# Patient Record
Sex: Female | Born: 1965
Health system: Southern US, Community
[De-identification: ages and names within clinical notes are randomized; demographics above are authoritative.]

## PROBLEM LIST (undated history)

## (undated) DIAGNOSIS — D649 Anemia, unspecified: Secondary | ICD-10-CM

## (undated) DIAGNOSIS — J309 Allergic rhinitis, unspecified: Secondary | ICD-10-CM

## (undated) DIAGNOSIS — N3010 Interstitial cystitis (chronic) without hematuria: Secondary | ICD-10-CM

## (undated) DIAGNOSIS — F419 Anxiety disorder, unspecified: Secondary | ICD-10-CM

## (undated) DIAGNOSIS — K5792 Diverticulitis of intestine, part unspecified, without perforation or abscess without bleeding: Secondary | ICD-10-CM

## (undated) DIAGNOSIS — A048 Other specified bacterial intestinal infections: Secondary | ICD-10-CM

## (undated) DIAGNOSIS — L259 Unspecified contact dermatitis, unspecified cause: Secondary | ICD-10-CM

## (undated) DIAGNOSIS — F0781 Postconcussional syndrome: Secondary | ICD-10-CM

## (undated) DIAGNOSIS — J302 Other seasonal allergic rhinitis: Secondary | ICD-10-CM

## (undated) DIAGNOSIS — I059 Rheumatic mitral valve disease, unspecified: Secondary | ICD-10-CM

## (undated) DIAGNOSIS — M545 Low back pain, unspecified: Secondary | ICD-10-CM

## (undated) DIAGNOSIS — E782 Mixed hyperlipidemia: Secondary | ICD-10-CM

## (undated) DIAGNOSIS — K219 Gastro-esophageal reflux disease without esophagitis: Secondary | ICD-10-CM

## (undated) DIAGNOSIS — J3089 Other allergic rhinitis: Principal | ICD-10-CM

## (undated) DIAGNOSIS — K589 Irritable bowel syndrome without diarrhea: Secondary | ICD-10-CM

## (undated) DIAGNOSIS — M1990 Unspecified osteoarthritis, unspecified site: Secondary | ICD-10-CM

## (undated) DIAGNOSIS — K648 Other hemorrhoids: Secondary | ICD-10-CM

## (undated) HISTORY — DX: Mixed hyperlipidemia: E78.2

## (undated) HISTORY — PX: SINUS SURGERY WITH INSTATRAK: SHX5215

## (undated) HISTORY — DX: Other hemorrhoids: K64.8

## (undated) HISTORY — DX: Unspecified osteoarthritis, unspecified site: M19.90

## (undated) HISTORY — DX: Irritable bowel syndrome, unspecified: K58.9

## (undated) HISTORY — DX: Low back pain, unspecified: M54.50

## (undated) HISTORY — DX: Anxiety disorder, unspecified: F41.9

## (undated) HISTORY — DX: Anemia, unspecified: D64.9

## (undated) HISTORY — DX: Allergic rhinitis, unspecified: J30.9

## (undated) HISTORY — DX: Low back pain: M54.5

## (undated) HISTORY — DX: Diverticulitis of intestine, part unspecified, without perforation or abscess without bleeding: K57.92

## (undated) HISTORY — DX: Other seasonal allergic rhinitis: J30.2

## (undated) HISTORY — DX: Other specified bacterial intestinal infections: A04.8

## (undated) HISTORY — DX: Rheumatic mitral valve disease, unspecified: I05.9

## (undated) HISTORY — DX: Unspecified contact dermatitis, unspecified cause: L25.9

## (undated) HISTORY — DX: Gastro-esophageal reflux disease without esophagitis: K21.9

## (undated) HISTORY — DX: Postconcussional syndrome: F07.81

## (undated) HISTORY — PX: MOUTH SURGERY: SHX715

## (undated) HISTORY — DX: Other allergic rhinitis: J30.89

## (undated) HISTORY — DX: Interstitial cystitis (chronic) without hematuria: N30.10

---

## 1993-12-31 ENCOUNTER — Encounter: Payer: Self-pay | Admitting: Internal Medicine

## 1994-01-06 ENCOUNTER — Encounter: Payer: Self-pay | Admitting: Internal Medicine

## 1994-01-22 ENCOUNTER — Encounter: Payer: Self-pay | Admitting: Internal Medicine

## 1995-01-27 ENCOUNTER — Encounter: Payer: Self-pay | Admitting: Internal Medicine

## 1995-02-05 ENCOUNTER — Encounter: Payer: Self-pay | Admitting: Internal Medicine

## 1995-03-10 ENCOUNTER — Encounter: Payer: Self-pay | Admitting: Internal Medicine

## 1995-07-29 ENCOUNTER — Encounter: Payer: Self-pay | Admitting: Internal Medicine

## 1999-01-08 ENCOUNTER — Ambulatory Visit: Admission: RE | Admit: 1999-01-08 | Discharge: 1999-01-08 | Payer: Self-pay | Admitting: Obstetrics and Gynecology

## 1999-02-18 ENCOUNTER — Encounter: Admission: RE | Admit: 1999-02-18 | Discharge: 1999-05-19 | Payer: Self-pay | Admitting: Obstetrics & Gynecology

## 1999-04-16 ENCOUNTER — Inpatient Hospital Stay (HOSPITAL_COMMUNITY): Admission: AD | Admit: 1999-04-16 | Discharge: 1999-04-19 | Payer: Self-pay | Admitting: Obstetrics and Gynecology

## 1999-04-16 ENCOUNTER — Encounter (INDEPENDENT_AMBULATORY_CARE_PROVIDER_SITE_OTHER): Payer: Self-pay

## 2000-04-05 ENCOUNTER — Encounter: Payer: Self-pay | Admitting: Internal Medicine

## 2001-09-13 ENCOUNTER — Other Ambulatory Visit: Admission: RE | Admit: 2001-09-13 | Discharge: 2001-09-13 | Payer: Self-pay | Admitting: Obstetrics and Gynecology

## 2002-04-07 ENCOUNTER — Ambulatory Visit (HOSPITAL_BASED_OUTPATIENT_CLINIC_OR_DEPARTMENT_OTHER): Admission: RE | Admit: 2002-04-07 | Discharge: 2002-04-07 | Payer: Self-pay | Admitting: *Deleted

## 2002-09-28 ENCOUNTER — Other Ambulatory Visit: Admission: RE | Admit: 2002-09-28 | Discharge: 2002-09-28 | Payer: Self-pay | Admitting: Obstetrics and Gynecology

## 2003-11-13 ENCOUNTER — Other Ambulatory Visit: Admission: RE | Admit: 2003-11-13 | Discharge: 2003-11-13 | Payer: Self-pay | Admitting: Obstetrics and Gynecology

## 2004-08-21 ENCOUNTER — Ambulatory Visit: Payer: Self-pay | Admitting: Internal Medicine

## 2004-09-25 ENCOUNTER — Ambulatory Visit: Payer: Self-pay | Admitting: Internal Medicine

## 2004-12-26 ENCOUNTER — Ambulatory Visit: Payer: Self-pay | Admitting: Family Medicine

## 2005-02-10 ENCOUNTER — Ambulatory Visit: Payer: Self-pay | Admitting: Internal Medicine

## 2005-03-04 ENCOUNTER — Other Ambulatory Visit: Admission: RE | Admit: 2005-03-04 | Discharge: 2005-03-04 | Payer: Self-pay | Admitting: Obstetrics and Gynecology

## 2005-06-18 ENCOUNTER — Ambulatory Visit: Payer: Self-pay | Admitting: Internal Medicine

## 2006-01-13 ENCOUNTER — Ambulatory Visit: Payer: Self-pay | Admitting: Internal Medicine

## 2006-01-18 ENCOUNTER — Ambulatory Visit: Payer: Self-pay | Admitting: Internal Medicine

## 2006-01-20 ENCOUNTER — Ambulatory Visit: Payer: Self-pay | Admitting: Internal Medicine

## 2006-03-01 ENCOUNTER — Ambulatory Visit: Payer: Self-pay | Admitting: Internal Medicine

## 2006-05-20 ENCOUNTER — Ambulatory Visit: Payer: Self-pay | Admitting: Internal Medicine

## 2006-05-21 ENCOUNTER — Emergency Department (HOSPITAL_COMMUNITY): Admission: EM | Admit: 2006-05-21 | Discharge: 2006-05-21 | Payer: Self-pay | Admitting: Emergency Medicine

## 2006-05-24 ENCOUNTER — Ambulatory Visit: Payer: Self-pay | Admitting: Internal Medicine

## 2006-05-28 ENCOUNTER — Encounter: Admission: RE | Admit: 2006-05-28 | Discharge: 2006-08-19 | Payer: Self-pay | Admitting: Internal Medicine

## 2006-06-04 ENCOUNTER — Ambulatory Visit: Payer: Self-pay | Admitting: Internal Medicine

## 2006-06-21 ENCOUNTER — Ambulatory Visit: Payer: Self-pay | Admitting: Internal Medicine

## 2006-06-25 ENCOUNTER — Encounter: Admission: RE | Admit: 2006-06-25 | Discharge: 2006-06-25 | Payer: Self-pay | Admitting: Internal Medicine

## 2006-08-30 ENCOUNTER — Ambulatory Visit: Payer: Self-pay | Admitting: Internal Medicine

## 2006-09-24 ENCOUNTER — Ambulatory Visit: Payer: Self-pay | Admitting: Family Medicine

## 2006-09-24 ENCOUNTER — Encounter: Admission: RE | Admit: 2006-09-24 | Discharge: 2006-10-01 | Payer: Self-pay | Admitting: Neurosurgery

## 2006-12-14 DIAGNOSIS — M545 Low back pain, unspecified: Secondary | ICD-10-CM | POA: Insufficient documentation

## 2006-12-14 DIAGNOSIS — J301 Allergic rhinitis due to pollen: Secondary | ICD-10-CM | POA: Insufficient documentation

## 2006-12-22 ENCOUNTER — Telehealth (INDEPENDENT_AMBULATORY_CARE_PROVIDER_SITE_OTHER): Payer: Self-pay | Admitting: *Deleted

## 2007-06-10 ENCOUNTER — Telehealth (INDEPENDENT_AMBULATORY_CARE_PROVIDER_SITE_OTHER): Payer: Self-pay | Admitting: *Deleted

## 2007-07-09 ENCOUNTER — Encounter: Payer: Self-pay | Admitting: Internal Medicine

## 2007-07-09 DIAGNOSIS — L259 Unspecified contact dermatitis, unspecified cause: Secondary | ICD-10-CM | POA: Insufficient documentation

## 2007-07-09 DIAGNOSIS — I059 Rheumatic mitral valve disease, unspecified: Secondary | ICD-10-CM | POA: Insufficient documentation

## 2007-07-09 DIAGNOSIS — J4520 Mild intermittent asthma, uncomplicated: Secondary | ICD-10-CM | POA: Insufficient documentation

## 2007-07-09 DIAGNOSIS — K219 Gastro-esophageal reflux disease without esophagitis: Secondary | ICD-10-CM | POA: Insufficient documentation

## 2007-09-01 ENCOUNTER — Encounter: Payer: Self-pay | Admitting: Internal Medicine

## 2007-12-05 ENCOUNTER — Telehealth: Payer: Self-pay | Admitting: Internal Medicine

## 2008-02-07 ENCOUNTER — Telehealth (INDEPENDENT_AMBULATORY_CARE_PROVIDER_SITE_OTHER): Payer: Self-pay | Admitting: *Deleted

## 2008-02-09 ENCOUNTER — Ambulatory Visit: Payer: Self-pay | Admitting: Internal Medicine

## 2008-11-15 ENCOUNTER — Encounter: Payer: Self-pay | Admitting: Internal Medicine

## 2009-11-06 ENCOUNTER — Ambulatory Visit: Payer: Self-pay | Admitting: Diagnostic Radiology

## 2009-11-06 ENCOUNTER — Emergency Department (HOSPITAL_BASED_OUTPATIENT_CLINIC_OR_DEPARTMENT_OTHER): Admission: EM | Admit: 2009-11-06 | Discharge: 2009-11-06 | Payer: Self-pay | Admitting: Emergency Medicine

## 2009-11-08 ENCOUNTER — Ambulatory Visit (HOSPITAL_BASED_OUTPATIENT_CLINIC_OR_DEPARTMENT_OTHER): Admission: RE | Admit: 2009-11-08 | Discharge: 2009-11-08 | Payer: Self-pay | Admitting: Emergency Medicine

## 2009-11-08 ENCOUNTER — Telehealth: Payer: Self-pay | Admitting: Internal Medicine

## 2009-11-08 ENCOUNTER — Ambulatory Visit: Payer: Self-pay | Admitting: Diagnostic Radiology

## 2010-01-24 ENCOUNTER — Ambulatory Visit: Payer: Self-pay | Admitting: Internal Medicine

## 2010-02-06 DIAGNOSIS — E162 Hypoglycemia, unspecified: Secondary | ICD-10-CM | POA: Insufficient documentation

## 2010-03-06 ENCOUNTER — Telehealth: Payer: Self-pay | Admitting: Internal Medicine

## 2010-03-07 ENCOUNTER — Ambulatory Visit: Payer: Self-pay | Admitting: Internal Medicine

## 2010-03-07 DIAGNOSIS — R197 Diarrhea, unspecified: Secondary | ICD-10-CM | POA: Insufficient documentation

## 2010-03-07 LAB — CONVERTED CEMR LAB
Basophils Absolute: 0 10*3/uL (ref 0.0–0.1)
CO2: 28 meq/L (ref 19–32)
Chloride: 102 meq/L (ref 96–112)
HCT: 35 % — ABNORMAL LOW (ref 36.0–46.0)
Hemoglobin: 12 g/dL (ref 12.0–15.0)
Lymphocytes Relative: 35.5 % (ref 12.0–46.0)
MCHC: 34.2 g/dL (ref 30.0–36.0)
MCV: 92 fL (ref 78.0–100.0)
Neutrophils Relative %: 49.1 % (ref 43.0–77.0)
Potassium: 3.8 meq/L (ref 3.5–5.1)
RBC: 3.8 M/uL — ABNORMAL LOW (ref 3.87–5.11)
Sodium: 137 meq/L (ref 135–145)
WBC: 3.6 10*3/uL — ABNORMAL LOW (ref 4.5–10.5)

## 2010-03-10 ENCOUNTER — Telehealth: Payer: Self-pay | Admitting: Internal Medicine

## 2010-03-11 ENCOUNTER — Telehealth: Payer: Self-pay | Admitting: Internal Medicine

## 2010-03-12 ENCOUNTER — Telehealth: Payer: Self-pay | Admitting: Internal Medicine

## 2010-03-17 ENCOUNTER — Telehealth: Payer: Self-pay | Admitting: Internal Medicine

## 2010-03-25 ENCOUNTER — Ambulatory Visit: Payer: Self-pay | Admitting: Internal Medicine

## 2010-03-25 DIAGNOSIS — D649 Anemia, unspecified: Secondary | ICD-10-CM | POA: Insufficient documentation

## 2010-03-25 DIAGNOSIS — K589 Irritable bowel syndrome without diarrhea: Secondary | ICD-10-CM | POA: Insufficient documentation

## 2010-04-03 ENCOUNTER — Telehealth: Payer: Self-pay | Admitting: Internal Medicine

## 2010-04-07 ENCOUNTER — Telehealth: Payer: Self-pay | Admitting: Internal Medicine

## 2010-04-09 ENCOUNTER — Telehealth: Payer: Self-pay | Admitting: Internal Medicine

## 2010-05-02 ENCOUNTER — Telehealth: Payer: Self-pay | Admitting: Internal Medicine

## 2010-05-05 ENCOUNTER — Encounter: Payer: Self-pay | Admitting: Gastroenterology

## 2010-05-14 ENCOUNTER — Encounter: Payer: Self-pay | Admitting: Internal Medicine

## 2010-05-15 ENCOUNTER — Ambulatory Visit: Admit: 2010-05-15 | Payer: Self-pay | Admitting: Gastroenterology

## 2010-05-22 ENCOUNTER — Telehealth: Payer: Self-pay | Admitting: Internal Medicine

## 2010-05-26 ENCOUNTER — Ambulatory Visit
Admission: RE | Admit: 2010-05-26 | Discharge: 2010-05-26 | Payer: Self-pay | Source: Home / Self Care | Attending: Internal Medicine | Admitting: Internal Medicine

## 2010-05-26 DIAGNOSIS — R143 Flatulence: Secondary | ICD-10-CM

## 2010-05-26 DIAGNOSIS — R142 Eructation: Secondary | ICD-10-CM

## 2010-05-26 DIAGNOSIS — R141 Gas pain: Secondary | ICD-10-CM | POA: Insufficient documentation

## 2010-05-27 ENCOUNTER — Telehealth: Payer: Self-pay | Admitting: Internal Medicine

## 2010-05-29 NOTE — Letter (Signed)
Summary: New Patient letter  [facility]  [address] [place], [place] [address]   Phone: [phone]  Fax: [phone]       05/14/2010 MRN: [id number]  KENDALE KOKOSKA [place] [address], [place]  [address]  Dear Ms. Mccartin,  Welcome to the Gastroenterology Division at [facility].    You are scheduled to see Dr.  Scarlette Shorts on 05-26-2010 at 11am on the 3rd floor at [facility], [place] [employer] [facility].  We ask that you try to arrive at our office 15 minutes prior to your appointment time to allow for check-in.  We would like you to complete the enclosed self-administered evaluation form prior to your visit and bring it with you on the day of your appointment.  We will review it with you.  Also, please bring a complete list of all your medications or, if you prefer, bring the medication bottles and we will list them.  Please bring your insurance card so that we may make a copy of it.  If your insurance requires a referral to see a specialist, please bring your referral form from your primary care physician.  Co-payments are due at the time of your visit and may be paid by cash, check or credit card.     Your office visit will consist of a consult with your physician (includes a physical exam), any laboratory testing he/she may order, scheduling of any necessary diagnostic testing (e.g. x-ray, ultrasound, CT-scan), and scheduling of a procedure (e.g. Endoscopy, Colonoscopy) if required.  Please allow enough time on your schedule to allow for any/all of these possibilities.    If you cannot keep your appointment, please call [phone] to cancel or reschedule prior to your appointment date.  This allows [place] the opportunity to schedule an appointment for another patient in need of care.  If you do not cancel or reschedule by 5 p.m. the business day prior to your appointment date, you will be charged a $50.00 late cancellation/no-show fee.    Thank you  for choosing [facility] for your medical needs.  We appreciate the opportunity to care for you.  Please visit [place] at our website  to learn more about our practice.                     Sincerely,                                                             The Gastroenterology Division

## 2010-05-29 NOTE — Letter (Signed)
Summary: New Patient letter  [facility]  [address] [place], [place] [address]   Phone: [phone]  Fax: [phone]       05/05/2010 MRN: [id number]  [name] [place] [address], [place]  [address]  Dear [name],  Welcome to the Gastroenterology Division at [facility].    You are scheduled to see Dr.  Sharlett Iles on 05-15-2010 at [place] on the 3rd floor at [facility], [place] [employer] [facility] [facility].  We ask that you try to arrive at our office 15 minutes prior to your appointment time to allow for check-in.  We would like you to complete the enclosed self-administered evaluation form prior to your visit and bring it with you on the day of your appointment.  We will review it with you.  Also, please bring a complete list of all your medications or, if you prefer, bring the medication bottles and we will list them.  Please bring your insurance card so that we may make a copy of it.  If your insurance requires a referral to see a specialist, please bring your referral form from your primary care physician.  Co-payments are due at the time of your visit and may be paid by cash, check or credit card.     Your office visit will consist of a consult with your physician (includes a physical exam), any laboratory testing he/she may order, scheduling of any necessary diagnostic testing (e.g. x-ray, ultrasound, CT-scan), and scheduling of a procedure (e.g. Endoscopy, Colonoscopy) if required.  Please allow enough time on your schedule to allow for any/all of these possibilities.    If you cannot keep your appointment, please call [phone] to cancel or reschedule prior to your appointment date.  This allows [place] the opportunity to schedule an appointment for another patient in need of care.  If you do not cancel or reschedule by 5 p.m. the business day prior to your appointment date, you will be charged a $50.00 late cancellation/no-show fee.    Thank you for  choosing [facility] for your medical needs.  We appreciate the opportunity to care for you.  Please visit [place] at our website  to learn more about our practice.                     Sincerely,                                                             The Gastroenterology Division

## 2010-05-29 NOTE — Progress Notes (Signed)
Summary: advise of rx  Phone Note Call from Patient Call back at Home Phone [phone] Call back at [phone]   Caller: Patient---triage vm Reason for Call: Referral Summary of Call: she was told to cut her Hyoscyamine in half. the pharmacist told her that this is a time release meda and cannot be cut in half for her spasms in her colon. please advise of next step. should she take her old rx from 2009, that dissolves under her tongue as needed? Initial call taken by: Despina Arias,  April 07, 2010 11:08 AM  Follow-up for Phone Call        pt informed ok to break in half per dr Arnoldo Morale Follow-up by: Allyne Gee, LPN,  December 12, 624THL 12:23 PM

## 2010-05-29 NOTE — Letter (Signed)
Summary: LAB RESULTS  LAB RESULTS   Imported By: Betsy Pries 01/24/2010 14:47:47  _____________________________________________________________________  External Attachment:    Type:   Image     Comment:   External Document

## 2010-05-29 NOTE — Letter (Signed)
Summary: New Patient letter  [facility]  [address] [place], [place] [address]   Phone: [phone]  Fax: [phone]       05/05/2010 MRN: [id number]  [name] [place] [address] [place], [place]  [address]  Dear Ms. Kozlowski,  Welcome to the Gastroenterology Division at [facility].    You are scheduled to see Dr.  Sharlett Iles on 05-15-2010 at [place] on the 3rd floor at [facility], [place] [employer] [facility] [facility].  We ask that you try to arrive at our office 15 minutes prior to your appointment time to allow for check-in.  We would like you to complete the enclosed self-administered evaluation form prior to your visit and bring it with you on the day of your appointment.  We will review it with you.  Also, please bring a complete list of all your medications or, if you prefer, bring the medication bottles and we will list them.  Please bring your insurance card so that we may make a copy of it.  If your insurance requires a referral to see a specialist, please bring your referral form from your primary care physician.  Co-payments are due at the time of your visit and may be paid by cash, check or credit card.     Your office visit will consist of a consult with your physician (includes a physical exam), any laboratory testing he/she may order, scheduling of any necessary diagnostic testing (e.g. x-ray, ultrasound, CT-scan), and scheduling of a procedure (e.g. Endoscopy, Colonoscopy) if required.  Please allow enough time on your schedule to allow for any/all of these possibilities.    If you cannot keep your appointment, please call [phone] to cancel or reschedule prior to your appointment date.  This allows [place] the opportunity to schedule an appointment for another patient in need of care.  If you do not cancel or reschedule by 5 p.m. the business day prior to your appointment date, you will be charged a $50.00 late cancellation/no-show fee.    Thank you for  choosing [facility] for your medical needs.  We appreciate the opportunity to care for you.  Please visit [place] at our website  to learn more about our practice.                     Sincerely,                                                             The Gastroenterology Division

## 2010-05-29 NOTE — Progress Notes (Signed)
  Phone Note Call from Patient   Summary of Call: stating levid works for stomach but after she takes it causes chest tightness and makes her very sleepy- can she half it or do you want to give her som ething else? Initial call taken by: Allyne Gee, LPN,  December  8, 624THL 11:07 AM  Follow-up for Phone Call        per dr Arnoldo Morale- she may take only 1/2 at a time Follow-up by: Allyne Gee, LPN,  December  8, 624THL 11:31 AM  Additional Follow-up for Phone Call Additional follow up Details #1::        Notified pt. Additional Follow-up by: Deanna Artis CMA AAMA,  April 03, 2010 11:39 AM

## 2010-05-29 NOTE — Progress Notes (Signed)
  Phone Note Call from Patient Call back at Home Phone [phone]   Caller: Patient Call For: Ricard Dillon MD Summary of Call: Pt is urinating a lot, is clear, and is very thirsty and tired.  Wonders about DM Initial call taken by: Deanna Artis CMA AAMA,  April 09, 2010 3:55 PM  Follow-up for Phone Call        pt worried that urine is clear- she was informed that is greast!!dont look for thing s that are wrong- her labs are good- dont worry about dm- then asked if medicien is making her tiree- I told her I didnt know- Follow-up by: Allyne Gee, LPN,  December 14, 624THL 4:23 PM

## 2010-05-29 NOTE — Progress Notes (Signed)
  Phone Note Call from Patient   Caller: Patient Call For: Ricard Dillon MD Summary of Call: Pt would like Singulair and Astelyn for sinus drainage and URI symptoms. [id number] Initial call taken by: [facility] CMA AAMA,  May 22, 2010 10:14 AM    Prescriptions: SINGULAIR 10 MG TABS (MONTELUKAST SODIUM) 1 once daily  #30 x 3   Entered by:   Allyne Gee, LPN   Authorized by:   Ricard Dillon MD   Signed by:   Allyne Gee, LPN on [id number]   Method used:   Electronically to        [facility] [id number] (retail)       [address]       [place], [place]  [address]       Ph: [phone]       Fax: [phone]   RxID:   [id number]

## 2010-05-29 NOTE — Assessment & Plan Note (Signed)
Summary: pt to re-est/per Dr. Edynn Gillock/cjr   Vital Signs:  Patient profile:   45 year old female Height:      59 inches Weight:      102 pounds BMI:     20.68 Temp:     98.2 degrees F oral Pulse rate:   68 / minute Resp:     12 per minute BP sitting:   110 / 70  (left arm)  Vitals Entered By: Allyne Gee, LPN (September 30, 624THL 3:00 PM) CC: re-establish Is Patient Diabetic? No   Primary Care Provider:  Ricard Dillon MD  CC:  re-establish.  History of Present Illness: Had a severe UTI and passed out... she felt hot and diaphoretic. She may have had a bowl of oatmeal ut did not eat that much... and she was on ciprofloxin she has risk for prediabete ande hypogycemia Her asthma has been stable her GERD   Preventive Screening-Counseling & Management  Alcohol-Tobacco     Smoking Status: never  Problems Prior to Update: 1)  Mitral Valve Prolapse  (ICD-424.0) 2)  Dermatitis  (ICD-692.9) 3)  Esophageal Reflux  (ICD-530.81) 4)  Asthma  (ICD-493.90) 5)  Allergic Rhinitis  (ICD-477.9) 6)  Family History of Alcoholism/addiction  (ICD-V61.41) 7)  Family History Diabetes 1st Degree Relative  (ICD-V18.0) 8)  Low Back Pain  (ICD-724.2)  Current Problems (verified): 1)  Mitral Valve Prolapse  (ICD-424.0) 2)  Dermatitis  (ICD-692.9) 3)  Esophageal Reflux  (ICD-530.81) 4)  Asthma  (ICD-493.90) 5)  Allergic Rhinitis  (ICD-477.9) 6)  Family History of Alcoholism/addiction  (ICD-V61.41) 7)  Family History Diabetes 1st Degree Relative  (ICD-V18.0) 8)  Low Back Pain  (ICD-724.2)  Medications Prior to Update: 1)  Singulair 10 Mg  Tabs (Montelukast Sodium) .... Take 1/2 By Mouth Once Daily 2)  Nasonex 50 Mcg/act Susp (Mometasone Furoate) .... Use As Directed 3)  Atrovent Hfa 17 Mcg/act Aers (Ipratropium Bromide Hfa) .... 2 Puffs Four Times A Day As Needed Rescue  Current Medications (verified): 1)  Nasonex 50 Mcg/act Susp (Mometasone Furoate) .... Use As Directed 2)  Atrovent  Hfa 17 Mcg/act Aers (Ipratropium Bromide Hfa) .... 2 Puffs Four Times A Day As Needed Rescue  Allergies (verified): 1)  ! Biaxin 2)  ! Pcn 3)  ! Codeine 4)  ! Amoxicillin 5)  ! Septra 6)  ! * Z-Pack 7)  ! Soma  Past History:  Family History: Last updated: 12/14/2006 Family History Diabetes 1st degree relative Family History Hypertension Family History of Alcoholism/Addiction  Social History: Last updated: 02/09/2008 Occupation: Occupational psychologist Married Marine scientist, Pilates at [employer] Never Smoked Alcohol use-yes  Risk Factors: Smoking Status: never (01/24/2010)  Past medical, surgical, family and social histories (including risk factors) reviewed, and no changes noted (except as noted below).  Past Medical History: Reviewed history from 07/09/2007 and no changes required. MITRAL VALVE PROLAPSE (ICD-424.0) DERMATITIS (ICD-692.9) ESOPHAGEAL REFLUX (ICD-530.81) ASTHMA (ICD-493.90) FAMILY HISTORY OF ALCOHOLISM/ADDICTION (ICD-V61.41) FAMILY HISTORY DIABETES 1ST DEGREE RELATIVE (ICD-V18.0) LOW BACK PAIN (ICD-724.2) ALLERGIC RHINITIS (ICD-477.9)  Past Surgical History: Reviewed history from 12/14/2006 and no changes required. Denies surgical history  Family History: Reviewed history from 12/14/2006 and no changes required. Family History Diabetes 1st degree relative Family History Hypertension Family History of Alcoholism/Addiction  Social History: Reviewed history from 02/09/2008 and no changes required. Occupation: Occupational psychologist Married Marine scientist, Pilates at [employer] Alcohol use-yes  Review of Systems       The patient complains of hoarseness.  The patient denies anorexia, fever, weight loss, weight gain, vision loss, decreased hearing, chest pain, syncope, dyspnea on exertion, peripheral edema, prolonged cough, headaches, hemoptysis, abdominal pain, hematochezia, hematuria, incontinence, genital sores, muscle weakness, suspicious skin lesions,  transient blindness, difficulty walking, depression, unusual weight change, abnormal bleeding, enlarged lymph nodes, angioedema, and breast masses.         Flu Vaccine Consent Questions     Do you have a history of severe allergic reactions to this vaccine? no    Any prior history of allergic reactions to egg and/or gelatin? no    Do you have a sensitivity to the preservative Thimersol? no    Do you have a past history of Guillan-Barre Syndrome? no    Do you currently have an acute febrile illness? no    Have you ever had a severe reaction to latex? no    Vaccine information given and explained to patient? yes    Are you currently pregnant? no    Lot Number:[id number]   Exp Date:10/25/2010   Site Given  Left Deltoid IM   Physical Exam  General:  alert and well-developed.   Head:  normocephalic and atraumatic.   Eyes:  pupils equal and pupils round.   Ears:  R ear normal and L ear normal.   Lungs:  normal respiratory effort and no wheezes.   Heart:  normal rate and regular rhythm.   Abdomen:  soft and non-tender.   Msk:  lumbar lordosis, SI joint tenderness, and trigger point tenderness.   Extremities:  No clubbing, cyanosis, edema, or deformity noted with normal full range of motion of all joints.   Neurologic:  alert & oriented X3 and gait normal.     Impression & Recommendations:  Problem # 1:  LOW BACK PAIN (ICD-724.2)  point injection for spasm site prepped in the left lower back  the site was prepped with betadyne and then 20 mg og depometrol was injected into the low back, the pr tolerated well  Discussed use of moist heat or ice, modified activities, medications, and stretching/strengthening exercises. Back care instructions given. To be seen in 2 weeks if no improvement; sooner if worsening of symptoms.   Orders: Trigger Point Injection (1 or 2 muscles) [id number]) Depo-Medrol 20mg  (J1020)  Problem # 2:  ESOPHAGEAL REFLUX (ICD-530.81) otc prilosec as needed the  possibilit of gerd contributing to asthma discussed  Problem # 3:  ASTHMA (ICD-493.90)  The following medications were removed from the medication list:    Singulair 10 Mg Tabs (Montelukast sodium) .Marland Kitchen... Take 1/2 by mouth once daily Her updated medication list for this problem includes:    Atrovent Hfa 17 Mcg/act Aers (Ipratropium bromide hfa) .Marland Kitchen... 2 puffs four times a day as needed rescue  Pulmonary Functions Reviewed: O2 sat: 100 (02/09/2008)  Problem # 4:  REACTIVE HYPOGLYCEMIA (ICD-251.2) reviewed diet and the effect of floroquinalones with pt this is the most likely explanation for her event  Complete Medication List: 1)  Nasonex 50 Mcg/act Susp (Mometasone furoate) .... Use as directed 2)  Atrovent Hfa 17 Mcg/act Aers (Ipratropium bromide hfa) .... 2 puffs four times a day as needed rescue  Other Orders: Admin 1st Vaccine [id number]) Flu Vaccine 52yrs + [id number])  Patient Instructions: 1)  jan cpx   Immunization History:  Tetanus/Td Immunization History:    Tetanus/Td:  historical (12/30/2007)

## 2010-05-29 NOTE — Miscellaneous (Signed)
Summary: Food Allergy Profile  Food Allergy Profile   Imported By: Laural Benes 02/06/2010 08:41:46  _____________________________________________________________________  External Attachment:    Type:   Image     Comment:   External Document

## 2010-05-29 NOTE — Progress Notes (Signed)
  Phone Note Call from Patient   Caller: Patient Call For: Ricard Dillon MD Summary of Call: Pt calls stating she is having swollen mouth, and some difficulty with voice.  Is taking Cipro and Metronidazole.  Per Dr. Arnoldo Morale ....stop Cipro, and start Keflex 500 mg one three times a day x 10 days. Medrol 4 mg. 3 x 2 dys 2x 2 days and 1 x 2 days and stop. Initial call taken by: [facility] CMA AAMA,  March 10, 2010 9:34 AM    New/Updated Medications: KEFLEX 500 MG CAPS (CEPHALEXIN) one by mouth three times a day x 10 days. MEDROL 4 MG TABS (METHYLPREDNISOLONE) 3 x 2 days, 2 x 2 days, and 2 x 2 days. Prescriptions: MEDROL 4 MG TABS (METHYLPREDNISOLONE) 3 x 2 days, 2 x 2 days, and 2 x 2 days.  #12 x 0   Entered by:   Deanna Artis CMA AAMA   Authorized by:   Ricard Dillon MD   Signed by:   Deanna Artis CMA AAMA on 03/10/2010   Method used:   Electronically to        [facility] [id number] (retail)       [place], [place]  [address]       Ph: [phone]       Fax: [phone]   RxID:   [id number] KEFLEX 500 MG CAPS (CEPHALEXIN) one by mouth three times a day x 10 days.  #30 x 0   Entered by:   Deanna Artis CMA AAMA   Authorized by:   Ricard Dillon MD   Signed by:   Deanna Artis CMA AAMA on 03/10/2010   Method used:   Electronically to        [facility] [id number] (retail)       [place], [place]  [address]       Ph: [phone]       Fax: [phone]   RxID:   [id number]  Per Dr Arnoldo Morale, may take 50 mg. Benadryl now.

## 2010-05-29 NOTE — Assessment & Plan Note (Signed)
Summary: abdominal cramps/nausea/freq diarrhea/cjr   Vital Signs:  Patient profile:   45 year old female Height:      59 inches Weight:      102 pounds BMI:     20.68 Temp:     98.9 degrees F oral Pulse rate:   68 / minute Resp:     14 per minute BP sitting:   106 / 60  Vitals Entered By: Allyne Gee, LPN (November 11, 624THL 12:33 PM) CC: c/o low abd cramping- didnt get levibid from pharmacy stating she was too weak Is Patient Diabetic? No   Primary Care Provider:  Ricard Dillon MD  CC:  c/o low abd cramping- didnt get levibid from pharmacy stating she was too weak.  History of Present Illness: the pt had abdominal pain after menstrual cycle started ( day 2) experienced palpitations and felt like she was going to pass out This week has noted crampy severe pain Increased gas and acid reflux, stomach pains with loose stools ( usually constipated) Has  noted increased loose stools ( this hjas gone on for a week) feels warms but has not noted a fever ( low grade fevers) Hx of IBS in the past noted a bruise on her leg   Preventive Screening-Counseling & Management  Alcohol-Tobacco     Smoking Status: never     Tobacco Counseling: not indicated; no tobacco use  Problems Prior to Update: 1)  Diarrhea of Presumed Infectious Origin  (ICD-009.3) 2)  Reactive Hypoglycemia  (ICD-251.2) 3)  Mitral Valve Prolapse  (ICD-424.0) 4)  Dermatitis  (ICD-692.9) 5)  Esophageal Reflux  (ICD-530.81) 6)  Asthma  (ICD-493.90) 7)  Allergic Rhinitis  (ICD-477.9) 8)  Family History of Alcoholism/addiction  (ICD-V61.41) 9)  Family History Diabetes 1st Degree Relative  (ICD-V18.0) 10)  Low Back Pain  (ICD-724.2)  Current Problems (verified): 1)  Reactive Hypoglycemia  (ICD-251.2) 2)  Mitral Valve Prolapse  (ICD-424.0) 3)  Dermatitis  (ICD-692.9) 4)  Esophageal Reflux  (ICD-530.81) 5)  Asthma  (ICD-493.90) 6)  Allergic Rhinitis  (ICD-477.9) 7)  Family History of Alcoholism/addiction   (ICD-V61.41) 8)  Family History Diabetes 1st Degree Relative  (ICD-V18.0) 9)  Low Back Pain  (ICD-724.2)  Medications Prior to Update: 1)  Nasonex 50 Mcg/act Susp (Mometasone Furoate) .... Use As Directed 2)  Atrovent Hfa 17 Mcg/act Aers (Ipratropium Bromide Hfa) .... 2 Puffs Four Times A Day As Needed Rescue 3)  Singulair 10 Mg Tabs (Montelukast Sodium) .Marland Kitchen.. 1 Once Daily 4)  Levbid 0.375 Mg Xr12h-Tab (Hyoscyamine Sulfate) .Marland Kitchen.. 1 Two Times A Day  Current Medications (verified): 1)  Nasonex 50 Mcg/act Susp (Mometasone Furoate) .... Use As Directed 2)  Atrovent Hfa 17 Mcg/act Aers (Ipratropium Bromide Hfa) .... 2 Puffs Four Times A Day As Needed Rescue 3)  Singulair 10 Mg Tabs (Montelukast Sodium) .Marland Kitchen.. 1 Once Daily 4)  Levbid 0.375 Mg Xr12h-Tab (Hyoscyamine Sulfate) .Marland Kitchen.. 1 Two Times A Day 5)  Metronidazole 250 Mg Tabs (Metronidazole) .... One By Mouth Two Times A Day For 7 Days  Allergies (verified): 1)  ! Biaxin 2)  ! Pcn 3)  ! Codeine 4)  ! Amoxicillin 5)  ! Septra 6)  ! * Z-Pack 7)  ! Soma  Past History:  Family History: Last updated: 12/14/2006 Family History Diabetes 1st degree relative Family History Hypertension Family History of Alcoholism/Addiction  Social History: Last updated: 02/09/2008 Occupation: Occupational psychologist Married Marine scientist, Pilates at [employer] Never Smoked Alcohol use-yes  Risk Factors: Smoking  Status: never (03/07/2010)  Past medical, surgical, family and social histories (including risk factors) reviewed, and no changes noted (except as noted below).  Past Medical History: Reviewed history from 07/09/2007 and no changes required. MITRAL VALVE PROLAPSE (ICD-424.0) DERMATITIS (ICD-692.9) ESOPHAGEAL REFLUX (ICD-530.81) ASTHMA (ICD-493.90) FAMILY HISTORY OF ALCOHOLISM/ADDICTION (ICD-V61.41) FAMILY HISTORY DIABETES 1ST DEGREE RELATIVE (ICD-V18.0) LOW BACK PAIN (ICD-724.2) ALLERGIC RHINITIS (ICD-477.9)  Past Surgical History: Reviewed history  from 12/14/2006 and no changes required. Denies surgical history  Family History: Reviewed history from 12/14/2006 and no changes required. Family History Diabetes 1st degree relative Family History Hypertension Family History of Alcoholism/Addiction  Social History: Reviewed history from 02/09/2008 and no changes required. 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    Physical Exam  General:  alert and well-developed.   Head:  normocephalic and atraumatic.   Eyes:  pupils equal and pupils round.   Ears:  R ear normal and L ear normal.   Neck:  supple and full ROM.   Lungs:  normal respiratory effort and no wheezes.   Heart:  normal rate and regular rhythm.   Abdomen:  soft and RUQ tenderness.   Msk:  No deformity or scoliosis noted of thoracic or lumbar spine.   Pulses:  R and L carotid,radial,femoral,dorsalis pedis and posterior tibial pulses are full and equal bilaterally Extremities:  No clubbing, cyanosis, edema, or deformity noted with normal full range of motion of all joints.   Neurologic:  No cranial nerve deficits noted. Station and gait are normal. Plantar reflexes are down-going bilaterally. DTRs are symmetrical throughout. Sensory, motor and coordinative functions appear intact.   Impression & Recommendations:  Problem # 1:  DIARRHEA OF PRESUMED INFECTIOUS ORIGIN (ICD-009.3) Assessment  Deteriorated begin flagyl  Orders: Venipuncture [id number]) Specimen Handling (99000) TLB-CBC Platelet - w/Differential (85025-CBCD) TLB-BMP (Basic Metabolic Panel-BMET) (99991111)  Discussed symptom control and diet. Call if worsening of symptoms or signs of dehydration.   Problem # 2:  REACTIVE HYPOGLYCEMIA (ICD-251.2) small frequent meals  Problem # 3:  ASTHMA (ICD-493.90)  Her updated medication list for this problem includes:    Atrovent Hfa 17 Mcg/act Aers (Ipratropium bromide hfa) .Marland Kitchen... 2 puffs four times a day as needed rescue    Singulair 10 Mg Tabs (Montelukast sodium) .Marland Kitchen... 1 once daily    Medrol 4 Mg Tabs (Methylprednisolone) .Marland KitchenMarland KitchenMarland KitchenMarland Kitchen 3 x 2 days, 2 x 2 days, and 2 x 2 days.  Pulmonary Functions Reviewed: O2 sat: 100 (02/09/2008)  Complete Medication List: 1)  Nasonex 50 Mcg/act Susp (Mometasone furoate) .... Use as directed 2)  Atrovent Hfa 17 Mcg/act Aers (Ipratropium bromide hfa) .... 2 puffs four times a day as needed rescue 3)  Singulair 10 Mg Tabs (Montelukast sodium) .Marland Kitchen.. 1 once daily 4)  Levbid 0.375 Mg Xr12h-tab (Hyoscyamine sulfate) .Marland Kitchen.. 1 two times a day 5)  Metronidazole 250 Mg Tabs (Metronidazole) .... One by mouth two times a day for 7 days 6)  Keflex 500 Mg Caps (Cephalexin) .... One by mouth three times a day x 10 days. 7)  Medrol 4 Mg Tabs (Methylprednisolone) .... 3 x 2 days,  2 x 2 days, and 2 x 2 days. 8)  Mycelex 10 Mg Troc (Clotrimazole) .... Let dissolve in mouth qid x 5 days  Patient Instructions: 1)  Please schedule a follow-up appointment in 2 months. 2)  gatoraid lite  thre quarts today.... the then at least two a day 3)  Take your antibiotic as prescribed until ALL of it is gone, but stop if you develop a rash or swelling and contact our office as soon as possible. take antibiotics with food Prescriptions: METRONIDAZOLE 250 MG TABS (METRONIDAZOLE) one by mouth two times a day for 7 days  #14 x 0   Entered and Authorized by:   Ricard Dillon  MD   Signed by:   Ricard Dillon MD on 03/07/2010   Method used:   Electronically to        [id number]* (retail)       [place], [place]  [address]       Ph: [phone]       Fax: [phone]   RxID:   [id number] CIPROFLOXACIN HCL 250 MG TABS (CIPROFLOXACIN HCL) one by mouth two times a day  #14 x 0   Entered and Authorized by:   Ricard Dillon MD   Signed by:   Ricard Dillon MD on 03/07/2010   Method used:   Electronically to        [id number]* (retail)       [place], [place]  [address]       Ph: [phone]       Fax: [phone]   RxID:   [id number]    Orders Added: 1)  Est. Patient Level IV [id number] 2)  Venipuncture [id number] 3)  Specimen Handling [id number])  TLB-CBC Platelet - w/Differential [85025-CBCD] 5)  TLB-BMP (Basic Metabolic Panel-BMET) [id number]

## 2010-05-29 NOTE — Progress Notes (Signed)
Summary: Pt passed out 2 days ago. Req work in appt today  Phone Note Call from Patient Call back at [phone] cell   Caller: Patient Summary of Call: Pt called and said that she has moved back to [place] and would like to re-establish with Dr. Arnoldo Morale. Pt said that she passed out two days ago, and is req to get a work in appt with Dr. Arnoldo Morale today. Pls advise.    Initial call taken by: Braulio Bosch,  November 08, 2009 9:28 AM  Follow-up for Phone Call        yes to the reestablish this will take a while to get the 30 min appointment to get her into the new EMR recomment she go to urgent care on [address] today they will send reconds of there finding as they are a part of our system now Follow-up by: Ricard Dillon MD,  November 08, 2009 11:10 AM  Additional Follow-up for Phone Call Additional follow up Details #1::        I called pt and notified her that Dr. Arnoldo Morale said he would re-est with her, but recommends pt go to urgent care, as noted above. Pt has sch a np appt for 01/24/10 and is going to go to Urgent Care as suggested.   Additional Follow-up by: Braulio Bosch,  November 08, 2009 11:35 AM

## 2010-05-29 NOTE — Progress Notes (Signed)
Summary: Pt req med for thrush in mouth and blood glucose meter  Phone Note Call from Patient Call back at [phone] cell   Caller: Patient Summary of Call: Pt called and now has thrush in her mouth due to high dose antibiotic. Pls call in med to [facility]. Pt is also wanting to know if Dr Arnoldo Morale has a blood glucose meter that she can have?     Initial call taken by: Braulio Bosch,  March 12, 2010 8:15 AM  Follow-up for Phone Call        nystatin lozenges-(mycelex trouches) 4 times a day for 5 days- let dissolve in mouth-yes she may have meter but she will have to pay for the strips--per dr Arnoldo Morale Follow-up by: Allyne Gee, LPN,  November 16, 624THL 10:01 AM    New/Updated Medications: MYCELEX 10 MG TROC (CLOTRIMAZOLE) let dissolve in mouth qid x 5 days Prescriptions: MYCELEX 10 MG TROC (CLOTRIMAZOLE) let dissolve in mouth qid x 5 days  #20 x 0   Entered by:   Deanna Artis CMA AAMA   Authorized by:   Ricard Dillon MD   Signed by:   Deanna Artis CMA AAMA on 03/12/2010   Method used:   Electronically to        [id number]* (retail)       [place],   [address]       Ph: [phone]       Fax: [phone]   [id number]  Notified pt.

## 2010-05-29 NOTE — Assessment & Plan Note (Signed)
Summary: consult re: abdominal pain/cjr   Vital Signs:  Patient profile:   45 year old female Height:      59 inches Weight:      102 pounds BMI:     20.68 Temp:     98.2 degrees F oral Pulse rate:   72 / minute Resp:     14 per minute BP sitting:   120 / 70  (left arm)  Vitals Entered By: Allyne Gee, LPN (November 29, 624THL 10:52 AM) CC: roa - f/u asthma and labs Is Patient Diabetic? No   Primary Care Theo Reither:  Ricard Dillon MD  CC:  roa - f/u asthma and labs.  History of Present Illness: follow up abdominal issues the pt has frequent stools and a sense that she has not completly emptyed has some increased urination ( drinks a lot of water still has burping ans increased gas The Bmet was good Mild anemia and normal WBCs hs of iron deficincy anemia has increased allergies in [place] increased stressors  Preventive Screening-Counseling & Management  Alcohol-Tobacco     Smoking Status: never     Tobacco Counseling: not indicated; no tobacco use  Problems Prior to Update: 1)  Diarrhea of Presumed Infectious Origin  (ICD-009.3) 2)  Reactive Hypoglycemia  (ICD-251.2) 3)  Mitral Valve Prolapse  (ICD-424.0) 4)  Dermatitis  (ICD-692.9) 5)  Esophageal Reflux  (ICD-530.81) 6)  Asthma  (ICD-493.90) 7)  Allergic Rhinitis  (ICD-477.9) 8)  Family History of Alcoholism/addiction  (ICD-V61.41) 9)  Family History Diabetes 1st Degree Relative  (ICD-V18.0) 10)  Low Back Pain  (ICD-724.2)  Current Problems (verified): 1)  Diarrhea of Presumed Infectious Origin  (ICD-009.3) 2)  Reactive Hypoglycemia  (ICD-251.2) 3)  Mitral Valve Prolapse  (ICD-424.0) 4)  Dermatitis  (ICD-692.9) 5)  Esophageal Reflux  (ICD-530.81) 6)  Asthma  (ICD-493.90) 7)  Allergic Rhinitis  (ICD-477.9) 8)  Family History of Alcoholism/addiction  (ICD-V61.41) 9)  Family History Diabetes 1st Degree Relative  (ICD-V18.0) 10)  Low Back Pain  (ICD-724.2)  Medications Prior to Update: 1)  Nasonex 50  Mcg/act Susp (Mometasone Furoate) .... Use As Directed 2)  Atrovent Hfa 17 Mcg/act Aers (Ipratropium Bromide Hfa) .... 2 Puffs Four Times A Day As Needed Rescue 3)  Singulair 10 Mg Tabs (Montelukast Sodium) .Marland Kitchen.. 1 Once Daily 4)  Levbid 0.375 Mg Xr12h-Tab (Hyoscyamine Sulfate) .Marland Kitchen.. 1 Two Times A Day 5)  Metronidazole 250 Mg Tabs (Metronidazole) .... One By Mouth Two Times A Day For 7 Days 6)  Keflex 500 Mg Caps (Cephalexin) .... One By Mouth Three Times A Day X 10 Days. 7)  Medrol 4 Mg Tabs (Methylprednisolone) .... 3 X 2 Days, 2 X 2 Days, and 2 X 2 Days. 8)  Mycelex 10 Mg Troc (Clotrimazole) .... Let Dissolve in Mouth Qid X 5 Days  Current Medications (verified): 1)  Nasonex 50 Mcg/act Susp (Mometasone Furoate) .... Use As Directed 2)  Atrovent Hfa 17 Mcg/act Aers (Ipratropium Bromide Hfa) .... 2 Puffs Four Times A Day As Needed Rescue 3)  Singulair 10 Mg Tabs (Montelukast Sodium) .Marland Kitchen.. 1 Once Daily 4)  Levbid 0.375 Mg Xr12h-Tab (Hyoscyamine Sulfate) .Marland Kitchen.. 1 Two Times A Day  Allergies (verified): 1)  ! Biaxin 2)  ! Pcn 3)  ! Codeine 4)  ! Amoxicillin 5)  ! Septra 6)  ! * Z-Pack 7)  ! 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  Past Medical History: Reviewed history from 07/09/2007 and no changes required. MITRAL VALVE PROLAPSE (ICD-424.0) DERMATITIS (ICD-692.9) ESOPHAGEAL REFLUX (ICD-530.81) ASTHMA (ICD-493.90) FAMILY HISTORY OF ALCOHOLISM/ADDICTION (ICD-V61.41) FAMILY HISTORY DIABETES 1ST DEGREE RELATIVE (ICD-V18.0) LOW  BACK PAIN (ICD-724.2) ALLERGIC RHINITIS (ICD-477.9)  Past Surgical History: Reviewed history from 12/14/2006 and no changes required. Denies surgical history  Family History: Reviewed history from 12/14/2006 and no changes required. Family History Diabetes 1st degree relative Family History Hypertension Family History of Alcoholism/Addiction  Social History: Reviewed history from 02/09/2008 and no changes required. 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    Physical Exam  General:  alert and well-developed.  pale.   Head:  normocephalic and atraumatic.   Eyes:  pupils equal and pupils round.   Ears:  R ear normal and L ear normal.   Neck:  supple and full ROM.   Lungs:  normal respiratory effort and no wheezes.   Heart:  normal rate and regular rhythm.   Abdomen:  soft and RUQ tenderness.   Msk:  No deformity or scoliosis noted of thoracic or lumbar spine.   Pulses:  R and L carotid,radial,femoral,dorsalis pedis and posterior tibial pulses are full and equal bilaterally Extremities:  No clubbing, cyanosis, edema, or deformity noted with normal full range of motion of all joints.   Neurologic:  No cranial nerve deficits noted. Station and gait are normal. Plantar reflexes are down-going bilaterally. DTRs are symmetrical throughout. Sensory, motor and coordinative functions appear intact.   Impression &  Recommendations:  Problem # 1:  IRRITABLE BOWEL SYNDROME (ICD-564.1) Assessment Deteriorated levbid two times a day she has not started this medication yet  Problem # 2:  ASTHMA (ICD-493.90) Assessment: Unchanged  again has not started the singular The following medications were removed from the medication list:    Medrol 4 Mg Tabs (Methylprednisolone) .Marland KitchenMarland KitchenMarland KitchenMarland Kitchen 3 x 2 days, 2 x 2 days, and 2 x 2 days. Her updated medication list for this problem includes:    Atrovent Hfa 17 Mcg/act Aers (Ipratropium bromide hfa) .Marland Kitchen... 2 puffs four times a day as needed rescue    Singulair 10 Mg Tabs (Montelukast sodium) .Marland Kitchen... 1 once daily  Pulmonary Functions Reviewed: O2 sat: 100 (02/09/2008)  Problem # 3:  ANEMIA (ICD-285.9) Assessment: Unchanged iron suppliments Hgb: 12.0 (03/07/2010)   Hct: 35.0 (03/07/2010)   Platelets: 259.0 (03/07/2010) RBC: 3.80 (03/07/2010)   RDW: 15.1 (03/07/2010)   WBC: 3.6 (03/07/2010) MCV: 92.0 (03/07/2010)   MCHC: 34.2 (03/07/2010)  Problem # 4:  ALLERGIC RHINITIS (ICD-477.9) Assessment: Deteriorated  Her updated medication list for this problem includes:    Nasonex 50 Mcg/act Susp (Mometasone furoate) ..... Use as directed  Discussed use of allergy medications and environmental measures.   Complete Medication List: 1)  Nasonex 50 Mcg/act Susp (Mometasone furoate) .... Use as  directed 2)  Atrovent Hfa 17 Mcg/act Aers (Ipratropium bromide hfa) .... 2 puffs four times a day as needed rescue 3)  Singulair 10 Mg Tabs (Montelukast sodium) .Marland Kitchen.. 1 once daily 4)  Levbid 0.375 Mg Xr12h-tab (Hyoscyamine sulfate) .Marland Kitchen.. 1 two times a day  Patient Instructions: 1)  Please schedule a follow-up appointment in 3 months. 2)  get on the levbid twice a day ( longer acting than the sub ligual) 3)  agree with the fiber 4)  get of a multivitamin with iron 5)  blood glucose looks good 6)   sample of singular 7)  CBC w/ Diff prior to visit, ICD-9:280.0   Orders Added: 1)  Est.  Patient Level IV [id number]

## 2010-05-29 NOTE — Progress Notes (Signed)
Summary: meds  Phone Note Call from Patient Call back at Home Phone [phone]   Caller: Patient Call For: Ricard Dillon MD Summary of Call: Pt wants Singulair 5 mg. sent to [facility] ([place]) and also a RX med for IBS Initial call taken by: Deanna Artis CMA AAMA,  March 06, 2010 1:09 PM  Follow-up for Phone Call        Pt called for status of Singulair and IBS med. Pls call in today.  Follow-up by: Braulio Bosch,  March 06, 2010 3:44 PM    New/Updated Medications: SINGULAIR 10 MG TABS (MONTELUKAST SODIUM) 1 once daily LEVBID 0.375 MG XR12H-TAB (HYOSCYAMINE SULFATE) 1 two times a day Prescriptions: LEVBID 0.375 MG XR12H-TAB (HYOSCYAMINE SULFATE) 1 two times a day  #60 x 3   Entered by:   Allyne Gee, LPN   Authorized by:   Ricard Dillon MD   Signed by:   Allyne Gee, LPN on [id number]   Method used:   Electronically to        [id number]* (retail)       [place], [place]  [address]       Ph: [phone]       Fax: [phone]   [id number] SINGULAIR 10 MG TABS (MONTELUKAST SODIUM) 1 once daily  #30 x 3   Entered by:   Allyne Gee, LPN   Authorized by:   Ricard Dillon MD   Signed by:   Allyne Gee, LPN on [id number]   Method used:   Electronically to        [id number]* (retail)       [address]       [place], [place]  [address]       Ph: [phone]       Fax: [phone]   RxID:   [id number]

## 2010-05-29 NOTE — Progress Notes (Signed)
Summary: more reactions  Phone Note Call from Patient   Caller: Patient Call For: Ricard Dillon MD Reason for Call: Acute Illness Summary of Call: Pt is having vaginal irritation, and dizziness after starting the Keflex.  Did not start the Medrol.........advised to take as directed and we would talk to Dr. Arnoldo Morale about the other symptoms. Initial call taken by: [facility] CMA AAMA,  March 11, 2010 11:31 AM  Follow-up for Phone Call        per dr Wynonia Sours s- take  an otc vaginal yeast med -take medrol as ordered and take antibioitcs! Follow-up by: Allyne Gee, LPN,  November 15, 624THL 2:24 PM  Additional Follow-up for Phone Call Additional follow up Details #1::        Pt. notified. Additional Follow-up by: Deanna Artis CMA AAMA,  March 11, 2010 4:02 PM

## 2010-05-29 NOTE — Progress Notes (Signed)
Summary: REQ FOR RESULTS  Phone Note Call from Patient   Caller: Patient    [phone]    Reason for Call: Acute Illness Summary of Call: Pt request results from recent [place]....  Can be reached at [phone].  Pt also states that she has been exp acid reflux and would like to know if it will be ok for her to take OTC meds for this while she is taking current meds....?  Initial call taken by: Duanne Moron,  March 17, 2010 10:22 AM  Follow-up for Phone Call        pt informed Follow-up by: Allyne Gee, LPN,  November 21, 624THL 1:31 PM

## 2010-05-29 NOTE — Progress Notes (Signed)
Summary: Pt wants to know if she needs referral to GI doctor  Phone Note Call from Patient Call back at [phone] cell   Caller: Patient Summary of Call: Pt is wondering if she needs to get a referral to a GI doctor, re: spams in lft side. Pls advise.  Initial call taken by: Braulio Bosch,  May 02, 2010 10:18 AM  Follow-up for Phone Call        ok per dr Renard Hamper message on machine for pt Follow-up by: Allyne Gee, LPN,  January  6, [id number] 10:55 AM

## 2010-05-29 NOTE — Progress Notes (Signed)
Summary: please clarify new directions  Phone Note From Pharmacy Call back at [phone]   Caller: [id number] Summary of Call: please call pharmacy regarding Medrol dosing. Initial call taken by: Despina Arias,  March 10, 2010 9:50 AM  Follow-up for Phone Call        Left message on pharmacy phone. Follow-up by: Deanna Artis CMA AAMA,  March 10, 2010 10:00 AM

## 2010-06-02 ENCOUNTER — Telehealth: Payer: Self-pay | Admitting: Internal Medicine

## 2010-06-04 NOTE — Progress Notes (Signed)
Summary: [facility]  [place]   Imported By: Phillis Knack 05/27/2010 07:50:51  _____________________________________________________________________  External Attachment:    Type:   Image     Comment:   External Document

## 2010-06-04 NOTE — Procedures (Signed)
Summary: [facility]  EGD/   Imported By: Phillis Knack 05/27/2010 07:37:53  _____________________________________________________________________  External Attachment:    Type:   Image     Comment:   External Document

## 2010-06-04 NOTE — Consult Note (Signed)
Summary: [facility]  [place]   Imported By: Phillis Knack 05/27/2010 07:49:32  _____________________________________________________________________  External Attachment:    Type:   Image     Comment:   External Document

## 2010-06-04 NOTE — Progress Notes (Signed)
Summary: [facility]  [place]   Imported By: Phillis Knack 05/27/2010 07:42:40  _____________________________________________________________________  External Attachment:    Type:   Image     Comment:   External Document

## 2010-06-04 NOTE — Progress Notes (Signed)
Summary: [facility]  [place]   Imported By: Phillis Knack 05/27/2010 07:52:16  _____________________________________________________________________  External Attachment:    Type:   Image     Comment:   External Document

## 2010-06-04 NOTE — Procedures (Signed)
Summary: Colonoscopy/[place]  Colonoscopy/[place]   Imported By: Phillis Knack 05/27/2010 07:40:11  _____________________________________________________________________  External Attachment:    Type:   Image     Comment:   External Document

## 2010-06-04 NOTE — Consult Note (Signed)
Summary: [place]  [place]   Imported By: Phillis Knack 05/27/2010 07:53:49  _____________________________________________________________________  External Attachment:    Type:   Image     Comment:   External Document

## 2010-06-04 NOTE — Assessment & Plan Note (Signed)
Summary: IBS & REFLUX   History of Present Illness Visit Type: Initial Consult Primary GI MD: Scarlette Shorts MD Primary Provider: Dr Benay Pillow Requesting Provider: Dr Benay Pillow Chief Complaint: Patient c/o LLQ abdominal pain which has since subsided after taking Keflex. She also c/o fecal urgency with pencil like stool. In addition she was having some reflux but states this has subsided as well since taking several days worth of Zegerid. History of Present Illness:   45 year old female with your little bowel syndrome, interstitial cystitis, asthma, mild GERD, anxiety disorder, and multiple drug allergies/intolerances. She presents today regarding lower abdominal cramping and loose stools. She was initially seen in this office in 1995 for abdominal complaints. She subsequently underwent colonoscopy and upper endoscopy. She had been seen in followup as well. She is known to have diarrhea predominant irritable bowel syndrome. Mild diverticulosis on colonoscopy. She has not been seen since 2001. She has been living outside of this state, but has moved back. This past fall she had difficulties with lower abdominal cramping and loose stools. She was treated with ciprofloxacin and Flagyl but did not tolerate the medications. Subsequently treated with Levbid which helped. Also had Medrol and Keflex (presumably for respiratory issues) which he thinks helped. She is rambling historian. No problems with vomiting, upper abdominal pain, bleeding, or weight loss. No family history of GI malignancy. Laboratories in November 2011 revealed unremarkable CBC and basic metabolic panel. Currently with intermittent loose stools with urgency. Was having some reflux symptoms. To Zegerid for about 3 days, then stop stating that it irritated her bladder. The reflux symptoms resolved. Other complaint now is gas.   GI Review of Systems    Reports abdominal pain, acid reflux, belching, bloating, loss of appetite, and  nausea.     Location of  Abdominal pain: LLQ.    Denies chest pain, dysphagia with liquids, dysphagia with solids, heartburn, vomiting, vomiting blood, weight loss, and  weight gain.      Reports diarrhea, hemorrhoids, irritable bowel syndrome, and  rectal pain.     Denies anal fissure, black tarry stools, change in bowel habit, constipation, diverticulosis, fecal incontinence, heme positive stool, jaundice, light color stool, liver problems, and  rectal bleeding. Preventive Screening-Counseling & Management  Caffeine-Diet-Exercise     Does Patient Exercise: no      Drug Use:  no.      Current Medications (verified): 1)  Nasonex 50 Mcg/act Susp (Mometasone Furoate) .... Use As Directed 2)  Nasalcrom 5.2 Mg/act Aers (Cromolyn Sodium) .... Take 1 Puff As Needed 3)  Singulair 4 Mg Chew (Montelukast Sodium) .... Take 1 Tablet By Mouth Once A Day As Needed 4)  Levbid 0.375 Mg Xr12h-Tab (Hyoscyamine Sulfate) .... Take 1/2 Tablet By Mouth Once Daily 5)  Calcium Plus Vitamin D3 600-500 Mg-Unit Caps (Calcium Carb-Cholecalciferol) .... Take 1 Tablet By Mouth Once Daily 6)  Probiotic .... Take 1 Capsule By Mouth Once Daily 7)  B Complex Tablets .... Take 1 Tablet By Mouth Once A Day (When Not Taking B12 Tablet) 8)  Vitamin B-12 1000 Mcg Tabs (Cyanocobalamin) .... Take 1 Tablet By Mouth Once A Day (When Not Taking B Complex) 9)  Calcium, Magnesium & Zinc (1000mg /400mg /15 Mg) .... Take 1 Capsule By Mouth Once Daily 10)  Papaya  Tabs (Carica Papaya) .... Take 1 Tablet By Mouth Once Daily 11)  Perfect Iron (50 Mg) .... Take 1 Tablet By Mouth Once A Day 12)  Benefiber  Powd (Wheat Dextrin) .... Take 1 Teaspoon  Dissolved in Water Daily 13)  Flax Seed Oil 1000 Mg Caps (Flaxseed (Linseed)) .... Take 1 Capsule By Mouth Once A Day  Allergies (verified): 1)  ! Biaxin 2)  ! Pcn 3)  ! Codeine 4)  ! Amoxicillin 5)  ! Septra 6)  ! * Z-Pack 7)  ! Soma 8)  ! Epinephrine 9)  ! Cipro 10)  ! Sulfa  Past  History:  Past Medical History: MITRAL VALVE PROLAPSE (ICD-424.0) DERMATITIS (ICD-692.9) ESOPHAGEAL REFLUX (ICD-530.81) ASTHMA (ICD-493.90) FAMILY HISTORY OF ALCOHOLISM/ADDICTION (ICD-V61.41) FAMILY HISTORY DIABETES 1ST DEGREE RELATIVE (ICD-V18.0) LOW BACK PAIN (ICD-724.2) ALLERGIC RHINITIS (ICD-477.9) Irritable Bowel Syndrome Diverticulosis Hemorrhoids Anxiety Disorder Anemia Interstitial Cystitis  Past Surgical History: C-Section Sinus Surgery  Family History: Family History Diabetes 1st degree relative: Mother Family History Hypertension Family History of Alcoholism/Addiction No FH of Colon Cancer: Mother, Sister "stomach problems" Family History of Colon Polyps: Sister  Social History: Occupation: Ship broker Married Marine scientist, Pilates at [employer]--not doing now Never Smoked Alcohol use-no Illicit Drug Use - no Patient does not get regular exercise.  Drug Use:  no Does Patient Exercise:  no  Review of Systems       The patient complains of allergy/sinus, anxiety-new, fatigue, itching, muscle pains/cramps, urination changes/pain, and urine leakage.  The patient denies anemia, arthritis/joint pain, back pain, blood in urine, breast changes/lumps, change in vision, confusion, cough, coughing up blood, depression-new, fainting, fever, headaches-new, hearing problems, heart murmur, heart rhythm changes, menstrual pain, night sweats, nosebleeds, pregnancy symptoms, shortness of breath, skin rash, sleeping problems, sore throat, swelling of feet/legs, swollen lymph glands, thirst - excessive, urination - excessive, vision changes, and voice change.    Vital Signs:  Patient profile:   45 year old female Height:      59 inches Weight:      104.13 pounds Pulse rate:   88 / minute Pulse rhythm:   regular BP sitting:   92 / 62  (left arm)  Vitals Entered By: Madlyn Frankel CMA Deborra Medina) (May 26, 2010 10:59 AM)  Physical Exam  General:  Well developed, well  nourished, no acute distress. Anxious. Head:  Normocephalic and atraumatic. Eyes:  PERRLA, no icterus. Ears:  Normal auditory acuity. Mouth:  No deformity or lesions, dentition normal. Neck:  Supple; no masses or thyromegaly. Lungs:  Clear throughout to auscultation. Heart:  Regular rate and rhythm; no murmurs, rubs,  or bruits. Abdomen:  Soft, nontender and nondistended. No masses, hepatosplenomegaly or hernias noted. Normal bowel sounds. Msk:  Symmetrical with no gross deformities. Normal posture. Pulses:  Normal pulses noted. Extremities:  No clubbing, cyanosis, edema or deformities noted. Neurologic:  Alert and  oriented x4;  grossly normal neurologically. Skin:  Intact without significant lesions or rashes. Psych:  Alert and cooperative. Normal mood and affect.   Impression & Recommendations:  Problem # 1:  IRRITABLE BOWEL SYNDROME (ICD-564.1) diarrhea predominant irritable bowel syndrome.  Plan: #1. Continue Levbid p.r.n. #2. Probiotic align one daily for a few weeks. Can use on demand it helpful. #3. IBS literature provided #4. Followup p.r.n.  Problem # 2:  ESOPHAGEAL REFLUX (ICD-530.81) transient. Resolved after a few days of PPI. Recommend reflux precautions  Problem # 3:  FLATULENCE-GAS-BLOATING (ICD-787.3) complaints of gas.  Plan at the gas and flatulence dietary she does well as brochure on intestinal gas.  Patient Instructions: 1)  Align samples given to patient to take 1 by mouth x 4 weeks 2)  IBS brochure given.  3)  Gas brochure given to patient.  4)  Copy sent to : Dr Benay Pillow 5)  The medication list was reviewed and reconciled.  All changed / newly prescribed medications were explained.  A complete medication list was provided to the patient / caregiver.

## 2010-06-04 NOTE — Progress Notes (Signed)
Summary: abd. pain and reflux  Phone Note Call from Patient Call back at [phone]   Caller: Patient Call For: Dr Henrene Pastor Reason for Call: Talk to Nurse Summary of Call: Patient states that she was seen yesterday and told Dr Henrene Pastor that she was feeling better but today she states that she woke up and has a lot of nausea and burning in the esophagus nad a dull pain on her colon. Wants to know what can she do. Initial call taken by: Ronalee Red,  May 27, 2010 11:00 AM  Follow-up for Phone Call        Awoke during the night with reflux and burning in esophageusRequesting a P.P.I.-can't take Prevacid because it intefered with urination. Also when awakened she  wa also having "deep,dull burning pain in lower middle quadrant which she says is a pain level of 8.Has had3 loose,sticky brown b.m.'s with some mucus but no blood this a.m. and  rectal burning.Had 1 episode of shaking chills  yesterday and one this a.m. but has not checked temp. Follow-up by: Abel Presto RN,  May 27, 2010 12:39 PM  Additional Follow-up for Phone Call Additional follow up Details #1::        Any PPI once daily ok w/ me. Continue w/ levbid for pain. Additional Follow-up by: Irene Shipper MD,  May 27, 2010 1:53 PM    Additional Follow-up for Phone Call Additional follow up Details #2::    Pt. advised of Dr.Perry's orders and rx for Pantoprazole sent to her pharmacy as she is familiar with it because her sister takes it. Follow-up by: Abel Presto RN,  May 27, 2010 2:53 PM  New/Updated Medications: PANTOPRAZOLE SODIUM 40 MG TBEC (PANTOPRAZOLE SODIUM) Take 1 p.o. one half hour before breakfast Prescriptions: PANTOPRAZOLE SODIUM 40 MG TBEC (PANTOPRAZOLE SODIUM) Take 1 p.o. one half hour before breakfast  #30 x 1   Entered by:   Abel Presto RN   Authorized by:   Irene Shipper MD   Signed by:   Abel Presto RN on 05/27/2010   Method used:   Electronically to        [facility]  [id number] (retail)       [place], [place]  [address]       Ph: [phone]       Fax: [phone]   [id number]

## 2010-06-04 NOTE — Progress Notes (Signed)
Summary: Mallory  Garcia   Imported By: Phillis Knack 05/27/2010 07:43:37  _____________________________________________________________________  External Attachment:    Type:   Image     Comment:   External Document

## 2010-06-12 NOTE — Progress Notes (Signed)
Summary: Hyoscamine rxn.?  Phone Note Call from Patient Call back at Home Phone [phone]   Caller: Patient Call For: Dr. Henrene Pastor Reason for Call: Talk to Nurse Summary of Call: pt. stopped the Hyoscyamin med. b/c it felt like her throat was closing up. Would like to discuss Initial call taken by: Webb Laws,  June 02, 2010 2:07 PM  Follow-up for Phone Call        Pt. stopped Hyoscamine  because prior symptoms of feeling of throat closing and heaviness in chest re-occurred.She had been using a half of tablet q.a.m. per rec. of PCP because when she was on whole tab. those symptoms re-occurred.Wants to know if this is a rxn. between the Hyoscamine and the Protonoix she started on 05/27/10 and does she need another drug similair to Hyoscamine if it is a rxn? Follow-up by: Abel Presto RN,  June 02, 2010 2:33 PM  Additional Follow-up for Phone Call Additional follow up Details #1::        It is not a reaction. I believe that she has significant anxiety issues. She should follow up w/ her PCP to possibly help in this regard Additional Follow-up by: Irene Shipper MD,  June 02, 2010 2:47 PM    Additional Follow-up for Phone Call Additional follow up Details #2::    Dr.Alyne Martinson's recommendations discussed with pt. She said" I do not have any anxiety issues"Told if there would have been a contraindication between these 2 drugs it might have been picked up by the pharmacist. She can always double check. Follow-up by: Abel Presto RN,  June 02, 2010 4:35 PM

## 2010-06-19 ENCOUNTER — Telehealth: Payer: Self-pay | Admitting: Internal Medicine

## 2010-06-19 NOTE — Telephone Encounter (Signed)
May  Give samples if available

## 2010-06-19 NOTE — Telephone Encounter (Signed)
NONE AVAILABLE

## 2010-06-19 NOTE — Telephone Encounter (Signed)
Pt would like singulair 4 mg samples

## 2010-06-23 ENCOUNTER — Other Ambulatory Visit (INDEPENDENT_AMBULATORY_CARE_PROVIDER_SITE_OTHER): Payer: 59 | Admitting: Internal Medicine

## 2010-06-23 DIAGNOSIS — D500 Iron deficiency anemia secondary to blood loss (chronic): Secondary | ICD-10-CM

## 2010-06-23 LAB — CBC WITH DIFFERENTIAL/PLATELET
Basophils Absolute: 0 10*3/uL (ref 0.0–0.1)
Basophils Relative: 0.5 % (ref 0.0–3.0)
Eosinophils Absolute: 0.2 10*3/uL (ref 0.0–0.7)
Lymphocytes Relative: 38.9 % (ref 12.0–46.0)
Lymphs Abs: 1.7 10*3/uL (ref 0.7–4.0)
Monocytes Relative: 8.4 % (ref 3.0–12.0)
Neutrophils Relative %: 47.6 % (ref 43.0–77.0)
Platelets: 300 10*3/uL (ref 150.0–400.0)
WBC: 4.4 10*3/uL — ABNORMAL LOW (ref 4.5–10.5)

## 2010-06-24 ENCOUNTER — Telehealth: Payer: Self-pay | Admitting: Internal Medicine

## 2010-06-27 ENCOUNTER — Encounter: Payer: Self-pay | Admitting: Internal Medicine

## 2010-06-30 ENCOUNTER — Encounter: Payer: Self-pay | Admitting: Internal Medicine

## 2010-06-30 ENCOUNTER — Ambulatory Visit: Payer: 59 | Admitting: Internal Medicine

## 2010-06-30 VITALS — BP 120/70 | HR 84 | Temp 98.1°F | Resp 14 | Ht 60.0 in | Wt 104.0 lb

## 2010-06-30 DIAGNOSIS — R141 Gas pain: Secondary | ICD-10-CM

## 2010-06-30 DIAGNOSIS — R143 Flatulence: Secondary | ICD-10-CM

## 2010-06-30 DIAGNOSIS — R142 Eructation: Secondary | ICD-10-CM

## 2010-06-30 DIAGNOSIS — D649 Anemia, unspecified: Secondary | ICD-10-CM

## 2010-06-30 DIAGNOSIS — K589 Irritable bowel syndrome without diarrhea: Secondary | ICD-10-CM

## 2010-06-30 MED ORDER — FERROUS GLUCONATE 216 MG PO TABS: 216.0000 mg | ORAL_TABLET | Freq: Two times a day (BID) | ORAL | Status: DC

## 2010-06-30 MED ORDER — DICYCLOMINE HCL 10 MG PO CAPS: 10.0000 mg | ORAL_CAPSULE | Freq: Three times a day (TID) | ORAL | Status: DC

## 2010-06-30 NOTE — Assessment & Plan Note (Signed)
Increased stress with increased symptoms

## 2010-06-30 NOTE — Assessment & Plan Note (Signed)
reduction of the PPI to every other day for one month and if possible to discontinue

## 2010-06-30 NOTE — Assessment & Plan Note (Signed)
The pt has menstrual blood loss and has not been able to take the iron due to interaction with the protonic Iron deficiency is the working diagnosis

## 2010-06-30 NOTE — Progress Notes (Signed)
  Subjective:    Patient ID: [name], female    DOB: [birth year], 45 y.o.   MRN: [id number]  HPI    Review of Systems     Objective:   Physical Exam        Assessment & Plan:

## 2010-06-30 NOTE — Patient Instructions (Signed)
Take the Protonix every other day for one month then stop

## 2010-07-03 ENCOUNTER — Telehealth: Payer: Self-pay | Admitting: Internal Medicine

## 2010-07-03 NOTE — Progress Notes (Signed)
Summary: Samples  Phone Note Call from Patient Call back at [phone]   Caller: Patient Call For: Dr. Henrene Pastor Reason for Call: Refill Medication Summary of Call: Wants to know if we have samples Align....and has questions about her meds. Initial call taken by: Webb Laws,  June 24, 2010 9:43 AM  Follow-up for Phone Call        Called patient and advised her that she only needed to take the [place] for 4 weeks.  She also wanted to know if she is suppose to refill her Pantoprazole and continue taking.  I advised her to continue if it is helping her reflux.  She states that it has helped.  Advised her to call when she is ready for refills.  She stated also she had stopped the Hyomax cause she had a reaction to it.  Her throat started closing up but once stopping it, it subsided.  Randye Lobo [facility]  June 24, 2010 4:10 PM

## 2010-07-03 NOTE — Telephone Encounter (Signed)
Pt has concerning about pantoprazole and generic bentayl she needs advice.

## 2010-07-03 NOTE — Telephone Encounter (Signed)
leftt message machine to return call

## 2010-07-07 NOTE — Telephone Encounter (Signed)
Unable,to contact pt

## 2010-07-08 NOTE — Progress Notes (Signed)
Summary: ? med side effect.  Phone Note Call from Patient Call back at [phone]   Caller: Patient Call For: Dr Henrene Pastor Reason for Call: Talk to Nurse Summary of Call: Patient has questions regardng meds, having side effects that would like to discuss with nurse. Initial call taken by: Ronalee Red,  July 03, 2010 10:23 AM  Follow-up for Phone Call        Spoke with patient and she states that she is having side effects from the Pantoproazole. States that she is having hypoglycemic episodes that she thinks is caused by the Pantoprazole. Patient also states that when she takes it she has tightness in her throat and it is hard to swallow. Patient wants to know if it is ok to stop taking it. She has been on it for a month. Dr. Henrene Pastor please advise. Follow-up by: Rosanne Sack RN,  July 03, 2010 11:22 AM  Additional Follow-up for Phone Call Additional follow up Details #1::         I think that it is unlikely that pantoprazole is causing these symptoms. However, she is welcome to stop the medication. She may want to see her PCP regarding these complaints Additional Follow-up by: Irene Shipper MD,  July 03, 2010 11:39 AM    Additional Follow-up for Phone Call Additional follow up Details #2::    Patient aware of Dr. Blanch Media recommendations.  Follow-up by: Rosanne Sack RN,  July 03, 2010 11:51 AM

## 2010-07-13 LAB — URINALYSIS, ROUTINE W REFLEX MICROSCOPIC
Glucose, UA: NEGATIVE mg/dL
Hgb urine dipstick: NEGATIVE
Ketones, ur: NEGATIVE mg/dL
pH: 7 (ref 5.0–8.0)

## 2010-07-13 LAB — COMPREHENSIVE METABOLIC PANEL
ALT: 16 U/L (ref 0–35)
CO2: 22 mEq/L (ref 19–32)
Chloride: 111 mEq/L (ref 96–112)
GFR calc Af Amer: >60 mL/min (ref >60)
Potassium: 3.6 mEq/L (ref 3.5–5.1)
Sodium: 142 mEq/L (ref 135–145)
Total Bilirubin: 1 mg/dL (ref 0.3–1.2)
Total Protein: 6.2 g/dL (ref 6.0–8.3)

## 2010-07-13 LAB — POCT CARDIAC MARKERS: Troponin i, poc: <0.05 ng/mL (ref 0.00–0.09)

## 2010-07-13 LAB — DIFFERENTIAL
Basophils Relative: 0 % (ref 0–1)
Eosinophils Absolute: 0 10*3/uL (ref 0.0–0.7)
Eosinophils Relative: 0 % (ref 0–5)
Lymphs Abs: 1 10*3/uL (ref 0.7–4.0)
Monocytes Absolute: 0.7 10*3/uL (ref 0.1–1.0)
Neutrophils Relative %: 84 % — ABNORMAL HIGH (ref 43–77)

## 2010-07-13 LAB — CBC
HCT: 28.2 % — ABNORMAL LOW (ref 36.0–46.0)
Hemoglobin: 9.2 g/dL — ABNORMAL LOW (ref 12.0–15.0)
MCHC: 32.7 g/dL (ref 30.0–36.0)
MCV: 86.5 fL (ref 78.0–100.0)
Platelets: 276 10*3/uL (ref 150–400)
RDW: 14.2 % (ref 11.5–15.5)
WBC: 10.9 10*3/uL — ABNORMAL HIGH (ref 4.0–10.5)

## 2010-07-13 LAB — PREGNANCY, URINE: Preg Test, Ur: NEGATIVE

## 2010-07-25 ENCOUNTER — Telehealth: Payer: Self-pay | Admitting: *Deleted

## 2010-07-25 NOTE — Telephone Encounter (Signed)
Pt is having extremely heavy period last night and today and is concerned about iron count and the amount of bleeding she is having.  She is on iron supplement.  ???  Never had period this heavy.

## 2010-07-25 NOTE — Telephone Encounter (Signed)
Needs to go to gyn

## 2010-07-25 NOTE — Telephone Encounter (Signed)
To gyn and pt informed

## 2010-08-06 ENCOUNTER — Telehealth: Payer: Self-pay | Admitting: Internal Medicine

## 2010-08-06 MED ORDER — MONTELUKAST SODIUM 10 MG PO TABS: 10.0000 mg | ORAL_TABLET | Freq: Every day | ORAL | Status: DC

## 2010-08-06 NOTE — Telephone Encounter (Signed)
[facility] to [facility] at [place]

## 2010-08-06 NOTE — Telephone Encounter (Signed)
Wants to get Cingulair refill called back in to [facility] with new insurance information.([employer])

## 2010-09-01 ENCOUNTER — Other Ambulatory Visit (INDEPENDENT_AMBULATORY_CARE_PROVIDER_SITE_OTHER): Payer: BLUE CROSS/BLUE SHIELD | Admitting: Internal Medicine

## 2010-09-01 DIAGNOSIS — D649 Anemia, unspecified: Secondary | ICD-10-CM

## 2010-09-01 DIAGNOSIS — E349 Endocrine disorder, unspecified: Secondary | ICD-10-CM

## 2010-09-01 LAB — CBC WITH DIFFERENTIAL/PLATELET
Basophils Absolute: 0 10*3/uL (ref 0.0–0.1)
Eosinophils Absolute: 0.1 10*3/uL (ref 0.0–0.7)
Eosinophils Relative: 2.4 % (ref 0.0–5.0)
HCT: 38.5 % (ref 36.0–46.0)
Hemoglobin: 13.5 g/dL (ref 12.0–15.0)
MCHC: 34.9 g/dL (ref 30.0–36.0)
Monocytes Absolute: 0.2 10*3/uL (ref 0.1–1.0)
Monocytes Relative: 5.6 % (ref 3.0–12.0)
RDW: 14.5 % (ref 11.5–14.6)
WBC: 3.2 10*3/uL — ABNORMAL LOW (ref 4.5–10.5)

## 2010-09-01 LAB — IRON: Iron: 93 ug/dL (ref 42–145)

## 2010-09-08 ENCOUNTER — Ambulatory Visit: Payer: 59 | Admitting: Internal Medicine

## 2010-09-12 ENCOUNTER — Telehealth: Payer: Self-pay | Admitting: Internal Medicine

## 2010-09-12 NOTE — Assessment & Plan Note (Signed)
[place]                               PULMONARY OFFICE NOTE   NAME:BURCHNeile, Nuding                        MRN:          [id number]  DATE:01/19/2006                            DOB:          12/18/1965    PULMONARY/ALLERGY CONSULTATION:   PROBLEMS:  Previous patient at [facility], where  she was last seen in 2004 and previous patient of Dr. Rush Landmark around 1995  for problems of asthma, allergic rhinitis, and dermatitis of her neck.  She  had recently seen Dr. Wilburn Cornelia and understands she is thought to have  interaction of acid reflux and asthma.  She had taken allergy vaccine for  about five years and says that it helped considerably with her allergic  rhinitis being much better after that.  She has been using a home nebulizer  for asthma symptoms, using only Tilade nebulizer solution because she says  albuterol makes her unacceptably nervous.  She has also used a Tilade  metered inhaler on a rescue/p.r.n. basis, mostly before exercise.  She  complains today of increased head pressure, postnasal drainage, itching and  sneezing.  Singulair has worked very well for her, but she has been out of  that and her nasal steroid spray.  She complains of a rash on her neck,  which she says was present before she put on a necklace she is wearing  today.  I see a note from Dr. Gerilyn Nestle in 2004 attributing rash on the neck  to contact dermatitis from a necklace at that time.   MEDICATIONS:  Singulair, Tilade, home nebulizer, for which she has had  albuterol.   Drug intolerance to CODEINE, SULFA, ASPIRIN, DIFLUCAN, and PENICILLIN.   REVIEW OF SYSTEMS:  She complains of a flaky, mildly pruritic rash on both  sides of her neck without urticaria.  Nasal congestion, postnasal drip,  sneeze, occasional frontal headache, acid indigestion, and heartburn.  No  wheeze or chest tightness currently.  No chest pain or palpitations.  No  blood,  fever, or purulent discharge.  No adenopathy or edema.   PAST MEDICAL HISTORY:  Mitral valve prolapse, asthma, elevated cholesterol,  allergic rhinitis.  She denies exposure to tuberculosis.  Previous GI  evaluation for abdominal pain attributed to irritable bowel and hemorrhoids.  I see a previous reference to a diagnosis of neuritic dermatitis and general  anxiety.  There is history of contact irritation from LATEX.  Not known if  she is intolerant of iodine and contrast dye and no problems with aspirin.  Her primary medication concern is that she is over-stimulated by albuterol.   SOCIAL HISTORY:  Never smoked.  Currently not married.  Lives with her  children.  Works as a Forensic scientist in an  office environment.   FAMILY HISTORY:  Allergies.   OBJECTIVE:  VITAL SIGNS:  Weight 101 pounds.  BP 116/56.  Pulse regular, 72.  Room air saturation 100%.  SKIN:  There is a nonspecific, dry, scaling dermatosis on bilateral sides of  her neck.  She is wearing a  thin chain necklace at this time.  HEENT:  Conjunctivae are clear.  Frequent watery sniffing without visible  postnasal drainage or polyps.  Pharynx is clear.  There is a little cerumen  in the right external auditory canal, not obstructing.  From what I can see  of the drums bilaterally was normal.  CHEST:  Quiet.  Clear lung fields.  HEART:  Heart sounds regular without murmur or gallop.  I do not hear a  murmur or click through her clothing.  No enlargement of the liver or  spleen.  EXTREMITIES:  There is no clubbing, cyanosis or edema.   IMPRESSION:  1. Allergic rhinitis.  2. Asthma.  3. Esophageal reflux.  4. Dry, itching skin on neck, eczema versus neurogenic dermatitis.   NOTE:  She says that Singulair has worked extremely well for her in the past  when she had a prescription for it.   PLAN:  1. I suggested Claritin as a p.r.n. antihistamine.  2. Sample Nasonex for trial but back-up  prescription as she requested for      fluticasone, 1 or 2 sprays each nostril daily.  3. Singulair sample, try every other day to see if that is sufficient      because of cost issues.  4. Noxema for her neck.  5. Refill Tilade solution for her nebulizer 1 q.i.d. p.r.n.  6. Prescription for ipratropium 0.5 mg nebulizer solution for her      nebulizer 1 q.i.d. p.r.n.  7. Schedule return in six weeks, earlier p.r.n.                                   Clinton D. Annamaria Boots, MD, [facility], FACP   CDY/MedQ  DD:  01/19/2006  DT:  01/20/2006  Job #:  [id number]   cc:   Ricard Dillon, MD  Early Chars. Wilburn Cornelia, M.D.

## 2010-09-12 NOTE — Telephone Encounter (Signed)
Needs copy of labs results.wants to pick up today.

## 2010-09-12 NOTE — Discharge Summary (Signed)
[facility]  PatientMERRIDEE Garcia                         MRN: [id number] Adm. Date:  [id number] Disc. Date: [id number] Attending:  Jerald Kief Dictator:   Gayla Medicus, R.N.                           Discharge Summary  ADMISSION DIAGNOSES:          1. Intrauterine pregnancy at [redacted] weeks gestation.                               2. Breech presentation.  DISCHARGE DIAGNOSES:          1. Intrauterine pregnancy at [redacted] weeks gestation.                               2. Breech presentation.  PROCEDURES:                   April 16, 1999, primary low transverse cesarean section.  HISTORY OF PRESENT ILLNESS:   The patient is a 45 year old, gravida 2, para 1, t 39-1/[redacted] weeks gestation, who was found to have breech presentation.  She has presented for primary transverse cesarean section.  The patients pregnancy has been complicated by diet-controlled gestational diabetes.  Her estimated date of confinement is April 18, 1999.  HOSPITAL COURSE:              The patient is taken to the operating room and undergoes the above-named procedure without complications.  This is productive f a viable female infant with Apgars of 9 at one minute and 9 at five minutes. Postoperatively, the patient does well.  On postoperative day #1, the patient was without complaint.  Her hemoglobin was 10.8, hematocrit 31.6, and white blood cell count 9.9.  On postoperative day #2, the patient had good return of bowel function, was tolerating a regular diet.  She was also ambulating well, without difficulty. She was discharged home on postoperative day #3.  CONDITION ON DISCHARGE:       Good.  DIET:                         Regular as tolerated.  ACTIVITY:                     No heavy lifting, no driving, no vaginal entry.  FOLLOW-UP:                    She is to follow up in the office in one to two weeks for incision check.  DISCHARGE INSTRUCTIONS:       She  is to call for temperature greater than 100 degrees, persistent nausea or vomiting, heavy vaginal bleeding, and/or redness r drainage from the incision site.  DISCHARGE MEDICATIONS:        1. Prenatal vitamin 1 p.o. q.d.                               2. Vicodin ES #30 1-2 p.o. q.6h. p.r.n. pain. DD:  05/20/99 TD:  05/21/99 Job: [id number]
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
## 2010-09-12 NOTE — Op Note (Signed)
   NAMEKRISTYANNA, [name]                           ACCOUNT NO.:  [id number]   MEDICAL RECORD NO.:  [id number]                   PATIENT TYPE:  AMB   LOCATION:  [facility]                                 FACILITY:  [facility]   PHYSICIAN:  Raelyn Mora., M.D.            DATE OF BIRTH:  Sep 19, 1965   DATE OF PROCEDURE:  04/07/2002  DATE OF DISCHARGE:                                 OPERATIVE REPORT   PREOPERATIVE DIAGNOSES:  1. Interstitial cystitis.  2. Recurrent urinary tract infections.  3. Asthma.  4. Irritable bowel syndrome.  5. Mitral prolapse.   POSTOPERATIVE DIAGNOSES:  1. Interstitial cystitis.  2. Recurrent urinary tract infections.  3. Asthma.  4. Irritable bowel syndrome.  5. Mitral prolapse.   OPERATION:  Cystoscopy, hydrodistention, Clorpactin irrigation of bladder.   DESCRIPTION OF PROCEDURE:  This patient was prepared for surgery with IV  gentamycin, underwent successful induction of general anesthesia was prepped  and draped in the lithotomy position. Using 28 and 30 sounds, the urethra  was dilated, it was tight. A culture was obtained. The bladder was then  inspected with a #22 cystourethroscope using 70 and 12 degree lenses.  Initially there was squamous metaplasia over the trigone, the right and left  ureteral orifices were normal and no stone or tumor was noted. There was  mild hyperemia and erythema. The bladder was then distended to capacity  which was approximately 900 ml and decompressed. She developed severe  generalized hyperemia and erythema throughout the bladder and moderately  severe hemorrhages over the trigone and adjacent areas which were petechial  in nature. There was not any cracking or bleeding. The patient's bladder was  emptied and irrigated with 1000 cc of Clorpactin solution, emptied again and  the patient returned to the recovery room in good condition.   The plan is for the patient to go home on Cipro XR 500 daily pending culture  report.  She will use Vicodin one q. 4h p.r.n. for pain. She will continue  her medications for asthma and see in the office next week.                                                 Raelyn Mora., M.D.    HB/MEDQ  D:  04/07/2002  T:  04/07/2002  Job:  [id number]
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
## 2010-09-12 NOTE — H&P (Signed)
NAMEORADELL, SKOGSTAD NO.:  [id number]   MEDICAL RECORD NO.:  [id number]                    PATIENT TYPE:   LOCATION:                                       FACILITY:   PHYSICIAN:  Raelyn Mora., M.D.            DATE OF BIRTH:  [birth year]   DATE OF ADMISSION:  DATE OF DISCHARGE:                                HISTORY & PHYSICAL   CHIEF COMPLAINT:  This patient age 45, comes in to the office on 03/31/02,  with a chief complaint of discomfort in her bladder since 4/03.   HISTORY OF PRESENT ILLNESS:  This patient has no history of urinary tract  infections or stones prior to 4/03, when she developed dysuria and some  discomfort in her sides.  She did not pass any gross gravel or stone, but  she did apparently have microscopic hematuria and she was treated on at  least two occasions recently with Cipro, but with only partial relief of her  symptoms.  Symptoms have gone through periods of being better and worse  since April, but recently during the past month they have been worse.  She  gets up once a night, she voids every hour, has a good deal of urgency.  In  the office, she had 10 to 15 red cells.  A culture was obtained, specific  gravity was 1030, pH 5, trace protein, and no sugar.  The ultrasound  revealed no hydronephrosis right or left, no evidence of stones, and no  significant residual urine.   ALLERGIES:  1. PENICILLIN.  2. SULFA.  3. ASPIRIN.  4. CODEINE.   MEDICATIONS:  1. Singular q.d.  2. Kilade p.r.n.  3. Cipro for about a week.   PAST MEDICAL HISTORY:  1. Mild asthma.  2. Mitral prolapse.  3. A few yeast infections over the years.  4. Cesarean section in 2000.   FAMILY HISTORY:  Positive for diabetes, hypertension, and cancer.  Her  mother has diabetes and hypertension, age 47.  The father died, age unknown,  lung cancer.  She has two children ages 13 and 5.   SOCIAL HISTORY:  She is married.  She does not abuse  alcohol or tobacco.  She works for [employer].   REVIEW OF SYMPTOMS:  GENERAL:  Her general health has been good.  Her weight  is stable.  She does wear glasses.  She does have sinusitis.  NECK:  No  problems with her neck.  CHEST:  No problems other then asthma.  HEART:  She  has mitral prolapse, but no chest pain.  GASTROINTESTINAL:  She does have  irritable bowel syndrome and frequent cramping, but she has passed no bloody  or tarry stools, no hepatitis, no stomach ulcers.  EXTREMITIES:  No  swelling, no arthritis.  NEUROPSYCHIATRIC:  No stroke, no headaches or  falling out spells.  She does faint easily when  they draw blood.  SKIN:  No  rashes, but she does itch easily.  LYMPHATIC:  No nodes.   PHYSICAL EXAMINATION:  VITAL SIGNS:  Temperature 99, pulse 87, respirations  20, blood pressure 105/70, weight 110.  GENERAL:  Well-developed, well-nourished 45 year old female.  HEENT:  Ears and tympanic membranes are clear.  Extraocular movements were  intact.  Pharynx benign.  Teeth in good repair.  NECK:  No enlargement of thyroid, no nodes are palpable.  CHEST:  Clear to auscultation and percussion.  HEART:  Normal sinus rhythm, no murmur detected.  ABDOMEN:  Liver, kidneys, spleen, masses are not detected.  She does have  suprapubic tenderness.  She has a low transverse scar.  GENITOURINARY:  The external genitalia and meatus are normal.  Support is  fairly good.  The bladder and urethra are both quite tender.  RECTAL:  Rectal tone is good.  No rectal or pelvic masses are noted.  PELVIC:  Uterus is in mid position.  EXTREMITIES:  No edema, good peripheral pulses.  NEUROLOGIC:  Grossly normal reflexes and sensation.  LYMPHATIC:  No nodes.  SKIN:  No lesions.   DIAGNOSES:  1. Chronic cystitis, possible interstitial cystitis.  2. Microscopic hematuria.  3. Asthma.   PLAN:  We will do a culture today and schedule her on 04/14/02, for  cystohydrodistention, possible retrograde  pyelograms at [facility].                                                Raelyn Mora., M.D.    HB/MEDQ  D:  03/31/2002  T:  03/31/2002  Job:  [id number]

## 2010-09-15 ENCOUNTER — Ambulatory Visit (INDEPENDENT_AMBULATORY_CARE_PROVIDER_SITE_OTHER): Payer: BLUE CROSS/BLUE SHIELD | Admitting: Family Medicine

## 2010-09-15 ENCOUNTER — Encounter: Payer: Self-pay | Admitting: Family Medicine

## 2010-09-15 VITALS — BP 122/80 | HR 84 | Temp 98.6°F | Wt 102.5 lb

## 2010-09-15 DIAGNOSIS — S93409A Sprain of unspecified ligament of unspecified ankle, initial encounter: Secondary | ICD-10-CM

## 2010-09-15 NOTE — Telephone Encounter (Signed)
Labs are up front ready for pick up- pt informed

## 2010-09-15 NOTE — Progress Notes (Signed)
  Subjective:    Patient ID: [name], female    DOB: 1965-08-22, 45 y.o.   MRN: [id number]  HPI Here after she injured her left ankle 3 days ago. While walking outside on some grass she stepped into a depression in the ground and twisted the ankle. She had pain and swelling over the lateral ankle and she heard and felt a "popping" sensation. Now the swelling is better but it hurts to walk on it. Using ice and Advil.    Review of Systems  Constitutional: Negative.   Musculoskeletal: Positive for joint swelling.       Objective:   Physical Exam  Constitutional:       Walks with a slight limp  Musculoskeletal:       The left lateral ankle is mildly swollen and is tender just at the inferior tip of the fibula. No crepitus. Good stability with full ROM.           Assessment & Plan:  This is probably a tiny avulsion fracture of the distal tip of the fibula. We agreed that getting Xrays would not be of benefit at this time, so we will not order them. She was fitted with an Aircast splint and felt immediate pain relief. She is to wear this at all times for 4 weeks, and to then begin some ROM exercises. No running or sports for 4 weeks

## 2010-10-10 ENCOUNTER — Encounter: Payer: Self-pay | Admitting: Family Medicine

## 2010-10-10 ENCOUNTER — Ambulatory Visit (INDEPENDENT_AMBULATORY_CARE_PROVIDER_SITE_OTHER): Payer: BLUE CROSS/BLUE SHIELD | Admitting: Family Medicine

## 2010-10-10 VITALS — BP 130/78 | Temp 98.0°F | Wt 103.0 lb

## 2010-10-10 DIAGNOSIS — K219 Gastro-esophageal reflux disease without esophagitis: Secondary | ICD-10-CM

## 2010-10-10 DIAGNOSIS — L299 Pruritus, unspecified: Secondary | ICD-10-CM

## 2010-10-10 DIAGNOSIS — R0789 Other chest pain: Secondary | ICD-10-CM

## 2010-10-10 NOTE — Progress Notes (Signed)
  Subjective:    Patient ID: [name], female    DOB: 1966/02/03, 45 y.o.   MRN: [id number]  HPI Patient seen with chest pain. First episode 4 days ago. Occurred at rest. Sharp pain mid substernal radiating to right side of neck. Last at about 5-6 seconds. Another episode occurred coupled nights later after turning over in bed. Sensation of cramping right jaw. Question mild dyspnea. No pleuritic pain. No recent cough. Denies arm symptoms. No recent exercise.  Also complaining of some generalized fatigue. Nonspecific pruritus last 2 weeks with no rash. No change of soaps or detergent. History of GERD with more active symptoms past 2-3 weeks. Frequent night symptoms. Frequently clears throat. Currently not taking medications.  Prior history of anemia. Recent hemoglobin 13.5. No syncope. No orthostatic symptoms.  Family history significant for father MI age 61. Patient nonsmoker. No history of diabetes or hypertension.   Review of Systems  Constitutional: Positive for fatigue. Negative for fever, chills, activity change and unexpected weight change.  HENT: Negative for neck stiffness.   Respiratory: Negative for cough, shortness of breath and wheezing.   Cardiovascular: Positive for chest pain. Negative for palpitations and leg swelling.  Gastrointestinal: Negative for abdominal pain and blood in stool.  Genitourinary: Negative for dysuria.  Neurological: Negative for dizziness, syncope and headaches.       Objective:   Physical Exam  Constitutional: She is oriented to person, place, and time. She appears well-developed and well-nourished. No distress.  HENT:  Mouth/Throat: Oropharynx is clear and moist.  Neck: Neck supple. No thyromegaly present.  Cardiovascular: Normal rate, regular rhythm and normal heart sounds.   No murmur heard. Pulmonary/Chest: Effort normal and breath sounds normal. No respiratory distress. She has no wheezes. She has no rales.  Abdominal: Soft. Bowel  sounds are normal. She exhibits no distension and no mass. There is no tenderness. There is no rebound and no guarding.  Musculoskeletal: She exhibits no edema.  Lymphadenopathy:    She has no cervical adenopathy.  Neurological: She is alert and oriented to person, place, and time.  Skin: No rash noted.  Psychiatric: She has a normal mood and affect. Her behavior is normal.          Assessment & Plan:  #1 atypical chest pain. Check EKG. Doubt cardiac related.  Fleeting R sided suggests more likely neuropathic or musculoskeletal cause. #2 GERD with symptoms poorly controlled. Diet discussed. Samples Nexium 40 mg one daily for short-term use hopefully. Followup with GI symptoms persist #3 nonspecific pruritus. No rash. Hepatic panel if symptoms persist

## 2010-10-10 NOTE — Patient Instructions (Signed)
Nexium 40 mg one tablet daily

## 2010-10-15 ENCOUNTER — Ambulatory Visit: Payer: BLUE CROSS/BLUE SHIELD | Admitting: Internal Medicine

## 2010-10-15 ENCOUNTER — Telehealth: Payer: Self-pay | Admitting: Internal Medicine

## 2010-10-15 NOTE — Telephone Encounter (Signed)
No

## 2010-10-15 NOTE — Telephone Encounter (Signed)
Pt states that she went to Dr. Elease Hashimoto and he gave her some Nexium samples, she was having problems with reflux. Pt states she was having reflux especially at night and that she was burping a lot. Dr. Elease Hashimoto gave her Nexium 40mg  samples to take in the evening. Pt just started taking the Nexium at the beginning of this week. She states that the burning is better and the burping is some better but she still states that she is burping up some sour liquid. Pt wants to know if she needs to have an endo. Dr. Henrene Pastor please advise.

## 2010-10-15 NOTE — Telephone Encounter (Signed)
Pt aware.

## 2010-10-24 ENCOUNTER — Other Ambulatory Visit: Payer: BLUE CROSS/BLUE SHIELD

## 2010-10-24 ENCOUNTER — Encounter: Payer: Self-pay | Admitting: Internal Medicine

## 2010-10-24 ENCOUNTER — Other Ambulatory Visit: Payer: Self-pay | Admitting: *Deleted

## 2010-10-24 ENCOUNTER — Ambulatory Visit (INDEPENDENT_AMBULATORY_CARE_PROVIDER_SITE_OTHER): Payer: BLUE CROSS/BLUE SHIELD | Admitting: Internal Medicine

## 2010-10-24 VITALS — BP 110/70 | HR 72 | Temp 98.2°F | Resp 16 | Ht 60.0 in | Wt 102.0 lb

## 2010-10-24 DIAGNOSIS — R109 Unspecified abdominal pain: Secondary | ICD-10-CM

## 2010-10-24 DIAGNOSIS — D649 Anemia, unspecified: Secondary | ICD-10-CM

## 2010-10-24 DIAGNOSIS — D259 Leiomyoma of uterus, unspecified: Secondary | ICD-10-CM

## 2010-10-24 DIAGNOSIS — D219 Benign neoplasm of connective and other soft tissue, unspecified: Secondary | ICD-10-CM

## 2010-10-24 MED ORDER — CENTRUM PO LIQD: 5.0000 mL | Freq: Every day | ORAL | Status: DC

## 2010-10-24 NOTE — Progress Notes (Signed)
Subjective:    Patient ID: [name], female    DOB: 1965-06-11, 45 y.o.   MRN: [id number]  HPI  Has uterine fibroids and has been referred to GYN for evaluation The pt has noted variable menstrual cycles and notes fatigue with menstruation Anemia may play a role Has hx of OCD about medical issues reviewed medications and side effects    Review of Systems  Constitutional: Negative for activity change, appetite change and fatigue.  HENT: Negative for ear pain, congestion, neck pain, postnasal drip and sinus pressure.   Eyes: Negative for redness and visual disturbance.  Respiratory: Negative for cough, shortness of breath and wheezing.   Gastrointestinal: Negative for abdominal pain and abdominal distention.  Genitourinary: Negative for dysuria, frequency and menstrual problem.  Musculoskeletal: Negative for myalgias, joint swelling and arthralgias.  Skin: Negative for rash and wound.  Neurological: Negative for dizziness, weakness and headaches.  Hematological: Negative for adenopathy. Does not bruise/bleed easily.  Psychiatric/Behavioral: Negative for sleep disturbance and decreased concentration.   Past Medical History  Diagnosis Date  . Mitral valve disorders   . Contact dermatitis and other eczema, due to unspecified cause   . Esophageal reflux   . Asthma   . Lumbago   . IBS (irritable bowel syndrome)   . Diverticulosis   . Hemorrhoid   . Anxiety   . Anemia   . IC (interstitial cystitis)    Past Surgical History  Procedure Date  . Cesarean section   . Sinus surgery with instatrak     reports that she has never smoked. She has never used smokeless tobacco. She reports that she does not drink alcohol or use illicit drugs. family history includes Colon polyps in her sister; Diabetes in her mother; Hyperlipidemia in her father; and Hypertension in her father and mother. Allergies  Allergen Reactions  . Latex Shortness Of Breath and Itching  . Amoxicillin   .  Asa Buff (Mag (Aspirin Buffered) Other (See Comments)    burning sensation  . Carisoprodol   . Ciprofloxacin     REACTION: SOB, "Tightness in head"  . Clarithromycin   . Codeine   . Epinephrine     REACTION: MVP  . Penicillins   . Sulfamethoxazole W/Trimethoprim   . Sulfonamide Derivatives     REACTION: itching, SOB        Objective:   Physical Exam  Constitutional: She is oriented to person, place, and time. She appears well-developed and well-nourished. No distress.  HENT:  Head: Normocephalic and atraumatic.  Right Ear: External ear normal.  Left Ear: External ear normal.  Nose: Nose normal.  Mouth/Throat: Oropharynx is clear and moist.  Eyes: Conjunctivae and EOM are normal. Pupils are equal, round, and reactive to light.  Neck: Normal range of motion. Neck supple. No JVD present. No tracheal deviation present. No thyromegaly present.  Cardiovascular: Normal rate, regular rhythm, normal heart sounds and intact distal pulses.   No murmur heard. Pulmonary/Chest: Effort normal and breath sounds normal. She has no wheezes. She exhibits no tenderness.  Abdominal: Soft. Bowel sounds are normal.  Musculoskeletal: Normal range of motion. She exhibits no edema and no tenderness.  Lymphadenopathy:    She has no cervical adenopathy.  Neurological: She is alert and oriented to person, place, and time. She has normal reflexes. No cranial nerve deficit.  Skin: Skin is warm and dry. She is not diaphoretic.  Psychiatric: She has a normal mood and affect. Her behavior is normal.  Assessment & Plan:  Monitor CBC  Refer to GYN for fibroids Discuss stress management  I have spent more than 30 minutes examining this patient face-to-face of which over half was spent in counseling

## 2010-10-27 ENCOUNTER — Telehealth: Payer: Self-pay | Admitting: Internal Medicine

## 2010-10-27 NOTE — Telephone Encounter (Signed)
Pt called and checked with [facility] in [facility] re: Multiple Vitamins-Minerals (MULTIVITAMIN WITH IRON-MINERALS) liquid, and was told by pharmacist that no med had been sent in. Pls call in and notify pt when done.

## 2010-10-27 NOTE — Telephone Encounter (Signed)
rx was sent in electronically, so I called and left on pharmacy's voicemail

## 2010-11-21 ENCOUNTER — Ambulatory Visit (INDEPENDENT_AMBULATORY_CARE_PROVIDER_SITE_OTHER): Payer: BLUE CROSS/BLUE SHIELD | Admitting: Internal Medicine

## 2010-11-21 ENCOUNTER — Encounter: Payer: Self-pay | Admitting: Internal Medicine

## 2010-11-21 VITALS — BP 110/66 | HR 68 | Ht 59.0 in | Wt 105.0 lb

## 2010-11-21 DIAGNOSIS — J45909 Unspecified asthma, uncomplicated: Secondary | ICD-10-CM

## 2010-11-21 MED ORDER — COMPRESSOR/NEBULIZER MISC: Status: DC

## 2010-11-21 NOTE — Patient Instructions (Addendum)
OK to try taking 5 mg( 1/2 tab) Singulair every other day, with food. See if you think that helps.    Order- schedule PFT  Script small portable nebulizer machine- use as directed.
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
## 2010-11-23 NOTE — Assessment & Plan Note (Addendum)
She describes sensitivity to stimulant meds, including bronchodilators. She is in no apparent distress now, with clear chest. Available classes of meds were reviewed. She has old rescue inhalers and Tilade from several years ago, which she says she has never tried. I am not sure she needs to have anything, but we might let her try Xopenex HFA so she has something to hold. We settled on replacing an old nebulizer machine and letting her try lighter dose Singulair.

## 2010-11-26 ENCOUNTER — Ambulatory Visit (INDEPENDENT_AMBULATORY_CARE_PROVIDER_SITE_OTHER): Payer: BLUE CROSS/BLUE SHIELD | Admitting: Internal Medicine

## 2010-11-26 DIAGNOSIS — J45909 Unspecified asthma, uncomplicated: Secondary | ICD-10-CM

## 2010-11-26 LAB — PULMONARY FUNCTION TEST

## 2010-11-26 NOTE — Progress Notes (Signed)
PFT done today. 

## 2010-12-03 ENCOUNTER — Encounter: Payer: Self-pay | Admitting: Internal Medicine

## 2010-12-04 ENCOUNTER — Ambulatory Visit (INDEPENDENT_AMBULATORY_CARE_PROVIDER_SITE_OTHER): Payer: BLUE CROSS/BLUE SHIELD | Admitting: Family Medicine

## 2010-12-04 ENCOUNTER — Encounter: Payer: Self-pay | Admitting: Family Medicine

## 2010-12-04 VITALS — BP 100/70 | Temp 98.1°F | Wt 103.0 lb

## 2010-12-04 DIAGNOSIS — M25569 Pain in unspecified knee: Secondary | ICD-10-CM

## 2010-12-04 DIAGNOSIS — M542 Cervicalgia: Secondary | ICD-10-CM
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
## 2010-12-04 NOTE — Patient Instructions (Signed)
Try heat and local muscle massage to muscles.

## 2010-12-12 ENCOUNTER — Other Ambulatory Visit (INDEPENDENT_AMBULATORY_CARE_PROVIDER_SITE_OTHER): Payer: BC Managed Care – PPO

## 2010-12-12 DIAGNOSIS — Z0000 Encounter for general adult medical examination without abnormal findings: Secondary | ICD-10-CM

## 2010-12-12 LAB — POCT URINALYSIS DIPSTICK
Bilirubin, UA: NEGATIVE
Leukocytes, UA: NEGATIVE
Nitrite, UA: NEGATIVE
Protein, UA: NEGATIVE
pH, UA: 8.5

## 2010-12-12 LAB — CBC WITH DIFFERENTIAL/PLATELET
Basophils Relative: 0.7 % (ref 0.0–3.0)
Eosinophils Absolute: 0.1 10*3/uL (ref 0.0–0.7)
Eosinophils Relative: 3 % (ref 0.0–5.0)
HCT: 36.6 % (ref 36.0–46.0)
Hemoglobin: 12.4 g/dL (ref 12.0–15.0)
Lymphs Abs: 1.3 10*3/uL (ref 0.7–4.0)
MCHC: 33.9 g/dL (ref 30.0–36.0)
MCV: 100.4 fl — ABNORMAL HIGH (ref 78.0–100.0)
Monocytes Absolute: 0.3 10*3/uL (ref 0.1–1.0)
Neutro Abs: 1.3 10*3/uL — ABNORMAL LOW (ref 1.4–7.7)
RBC: 3.65 Mil/uL — ABNORMAL LOW (ref 3.87–5.11)
WBC: 3 10*3/uL — ABNORMAL LOW (ref 4.5–10.5)

## 2010-12-12 LAB — HEPATIC FUNCTION PANEL
ALT: 13 U/L (ref 0–35)
AST: 19 U/L (ref 0–37)
Albumin: 4 g/dL (ref 3.5–5.2)
Alkaline Phosphatase: 63 U/L (ref 39–117)

## 2010-12-12 LAB — BASIC METABOLIC PANEL
CO2: 29 mEq/L (ref 19–32)
Chloride: 105 mEq/L (ref 96–112)
Creatinine, Ser: 0.6 mg/dL (ref 0.4–1.2)
Potassium: 3.9 mEq/L (ref 3.5–5.1)

## 2010-12-12 LAB — LIPID PANEL
HDL: 52.7 mg/dL (ref >39.00)
Total CHOL/HDL Ratio: 3

## 2010-12-25 ENCOUNTER — Ambulatory Visit (INDEPENDENT_AMBULATORY_CARE_PROVIDER_SITE_OTHER): Payer: BLUE CROSS/BLUE SHIELD | Admitting: Internal Medicine

## 2010-12-25 DIAGNOSIS — K9041 Non-celiac gluten sensitivity: Secondary | ICD-10-CM

## 2010-12-25 DIAGNOSIS — D518 Other vitamin B12 deficiency anemias: Secondary | ICD-10-CM

## 2010-12-25 DIAGNOSIS — J45909 Unspecified asthma, uncomplicated: Secondary | ICD-10-CM

## 2010-12-25 DIAGNOSIS — D519 Vitamin B12 deficiency anemia, unspecified: Secondary | ICD-10-CM

## 2010-12-25 DIAGNOSIS — Z0000 Encounter for general adult medical examination without abnormal findings: Secondary | ICD-10-CM

## 2010-12-25 DIAGNOSIS — K900 Celiac disease: Secondary | ICD-10-CM

## 2010-12-25 MED ORDER — CYANOCOBALAMIN 1000 MCG/ML IJ SOLN
1000.0000 ug | INTRAMUSCULAR | Status: DC
  Administered 2010-12-25: 1000 ug via INTRAMUSCULAR

## 2010-12-25 MED ORDER — MONTELUKAST SODIUM 4 MG PO CHEW: 4.0000 mg | CHEWABLE_TABLET | Freq: Every evening | ORAL | Status: DC

## 2010-12-25 NOTE — Progress Notes (Signed)
Subjective:    Patient ID: [name], female    DOB: 07/20/1965, 45 y.o.   MRN: [id number]  HPI cpx  In addition to her yearly examination the patient is concerned about her asthma she has had no recent flares but has been off of her Singulair due to GI intolerance fall is her primary allergy season.  She has had some problems with her irritable bowel and increased abdominal pain and discomfort she has minimized her medications at this point.  She is no longer on any medicines for anxiety and depression and she is off her proton pump inhibitors for her GERD    Review of Systems  Constitutional: Negative for activity change, appetite change and fatigue.  HENT: Negative for ear pain, congestion, neck pain, postnasal drip and sinus pressure.   Eyes: Negative for redness and visual disturbance.  Respiratory: Negative for cough, shortness of breath and wheezing.   Gastrointestinal: Negative for abdominal pain and abdominal distention.  Genitourinary: Negative for dysuria, frequency and menstrual problem.  Musculoskeletal: Negative for myalgias, joint swelling and arthralgias.  Skin: Negative for rash and wound.  Neurological: Negative for dizziness, weakness and headaches.  Hematological: Negative for adenopathy. Does not bruise/bleed easily.  Psychiatric/Behavioral: Negative for sleep disturbance and decreased concentration.   Past Medical History  Diagnosis Date  . Mitral valve disorders   . Contact dermatitis and other eczema, due to unspecified cause   . Esophageal reflux   . Asthma   . Lumbago   . IBS (irritable bowel syndrome)   . Diverticulosis   . Hemorrhoid   . Anxiety   . Anemia   . IC (interstitial cystitis)    Past Surgical History  Procedure Date  . Cesarean section   . Sinus surgery with instatrak     reports that she has never smoked. She has never used smokeless tobacco. She reports that she does not drink alcohol or use illicit drugs. family history  includes Colon polyps in her sister; Diabetes in her mother; Hyperlipidemia in her father; and Hypertension in her father and mother. Allergies  Allergen Reactions  . Latex Shortness Of Breath and Itching  . Amoxicillin   . Asa Buff (Mag (Aspirin Buffered) Other (See Comments)    burning sensation  . Carisoprodol   . Ciprofloxacin     REACTION: SOB, "Tightness in head"  . Clarithromycin   . Codeine   . Epinephrine     REACTION: MVP  . Penicillins   . Sulfamethoxazole W/Trimethoprim   . Sulfonamide Derivatives     REACTION: itching, SOB       Objective:   Physical Exam  Nursing note and vitals reviewed. Constitutional: She is oriented to person, place, and time. She appears well-developed and well-nourished. No distress.  HENT:  Head: Normocephalic and atraumatic.  Right Ear: External ear normal.  Left Ear: External ear normal.  Nose: Nose normal.  Mouth/Throat: Oropharynx is clear and moist.  Eyes: Conjunctivae and EOM are normal. Pupils are equal, round, and reactive to light.  Neck: Normal range of motion. Neck supple. No JVD present. No tracheal deviation present. No thyromegaly present.  Cardiovascular: Normal rate, regular rhythm, normal heart sounds and intact distal pulses.   No murmur heard. Pulmonary/Chest: Effort normal and breath sounds normal. She has no wheezes. She exhibits no tenderness.  Abdominal: Soft. Bowel sounds are normal.  Musculoskeletal: Normal range of motion. She exhibits no edema and no tenderness.  Lymphadenopathy:    She has no cervical adenopathy.  Neurological: She is alert and oriented to person, place, and time. She has normal reflexes. No cranial nerve deficit.  Skin: Skin is warm and dry. She is not diaphoretic.  Psychiatric: She has a normal mood and affect. Her behavior is normal.          Assessment & Plan:   This is a routine physical examination for this healthy  Female. Reviewed all health maintenance protocols including  mammography colonoscopy bone density and reviewed appropriate screening labs. Her immunization history was reviewed as well as her current medications and allergies refills of her chronic medications were given and the plan for yearly health maintenance was discussed all orders and referrals were made as appropriate.  Review of the patient's seasonal allergies and asthma.  She has done well this summer but is off of her Singulair due to an intolerance of the 10 mg dose.  She attempted to cut the dose in half but was unable to tolerate a half dose.  In the past we have used Singulair 4 mg chewable tablets with her with success and T2 her body mass and size I think this would be a more appropriate dose for her with less GI side effects.  She states that while she was in [place] that her prior physician had continued the 4 mg Singulair but that the allergist had bumped up to 10 when she came back to [place].  We will resume the 4 mg dose and asked her to begin on a chronic basis as she is approaching her major allergy season.  We discussed her irritable bowel syndrome combined with her assessment of obsessive compulsive tendencies which she is fully aware  In the fact that when she has abdominal pain that her mind takes her to a fear of cancer.  At this time she's had a pleural investigation by both GI and her cells and we believe that her current treatment plan is appropriate at best we reassured her at this time and stressed the need to exercise on a regular basis for sense of better well-being

## 2010-12-26 ENCOUNTER — Telehealth: Payer: Self-pay | Admitting: Internal Medicine

## 2010-12-26 ENCOUNTER — Encounter: Payer: Self-pay | Admitting: Internal Medicine

## 2011-01-05 ENCOUNTER — Telehealth: Payer: Self-pay | Admitting: *Deleted

## 2011-01-05 DIAGNOSIS — R55 Syncope and collapse: Secondary | ICD-10-CM

## 2011-01-05 NOTE — Telephone Encounter (Signed)
Pt had one "black out" spell last week, and occ therapist thinks she needs testing.

## 2011-01-06 ENCOUNTER — Encounter: Payer: Self-pay | Admitting: Neurology

## 2011-01-06 NOTE — Telephone Encounter (Signed)
May refer to neurology

## 2011-01-09 ENCOUNTER — Other Ambulatory Visit (HOSPITAL_BASED_OUTPATIENT_CLINIC_OR_DEPARTMENT_OTHER): Payer: Self-pay | Admitting: Obstetrics and Gynecology

## 2011-01-09 DIAGNOSIS — Z1231 Encounter for screening mammogram for malignant neoplasm of breast: Secondary | ICD-10-CM

## 2011-01-15 ENCOUNTER — Encounter: Payer: Self-pay | Admitting: Neurology

## 2011-01-15 ENCOUNTER — Ambulatory Visit (INDEPENDENT_AMBULATORY_CARE_PROVIDER_SITE_OTHER): Payer: BLUE CROSS/BLUE SHIELD | Admitting: Neurology

## 2011-01-15 VITALS — BP 98/64 | HR 76 | Wt 104.0 lb

## 2011-01-15 DIAGNOSIS — R55 Syncope and collapse: Secondary | ICD-10-CM

## 2011-01-15 MED ORDER — AMITRIPTYLINE HCL 10 MG PO TABS: 10.0000 mg | ORAL_TABLET | Freq: Every day | ORAL | Status: DC
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
## 2011-01-15 NOTE — Patient Instructions (Signed)
Your EEG is scheduled for Tuesday, Sept 25th at [facility]. [place]  [id number].

## 2011-01-16 ENCOUNTER — Ambulatory Visit (HOSPITAL_BASED_OUTPATIENT_CLINIC_OR_DEPARTMENT_OTHER)
Admission: RE | Admit: 2011-01-16 | Discharge: 2011-01-16 | Disposition: A | Discharge status: Home / Self Care | Payer: BC Managed Care – PPO | Source: Ambulatory Visit | Attending: Obstetrics and Gynecology | Admitting: Obstetrics and Gynecology

## 2011-01-16 DIAGNOSIS — Z1231 Encounter for screening mammogram for malignant neoplasm of breast: Secondary | ICD-10-CM | POA: Insufficient documentation

## 2011-01-16 DIAGNOSIS — N63 Unspecified lump in unspecified breast: Secondary | ICD-10-CM | POA: Insufficient documentation

## 2011-01-23 ENCOUNTER — Other Ambulatory Visit: Payer: Self-pay | Admitting: Obstetrics and Gynecology

## 2011-01-23 DIAGNOSIS — R928 Other abnormal and inconclusive findings on diagnostic imaging of breast: Secondary | ICD-10-CM

## 2011-01-27 ENCOUNTER — Ambulatory Visit: Payer: BLUE CROSS/BLUE SHIELD | Admitting: Internal Medicine

## 2011-01-27 NOTE — Progress Notes (Signed)
Received EEG report from 01/20/11 at [facility].  Noted to be normal.  Patient became drowsy but did not sleep.

## 2011-01-30 NOTE — Telephone Encounter (Signed)
Chart opened in error

## 2011-02-06 ENCOUNTER — Other Ambulatory Visit: Payer: BLUE CROSS/BLUE SHIELD

## 2011-02-18 ENCOUNTER — Ambulatory Visit
Admission: RE | Admit: 2011-02-18 | Discharge: 2011-02-18 | Disposition: A | Discharge status: Home / Self Care | Payer: BC Managed Care – PPO | Source: Ambulatory Visit | Attending: Obstetrics and Gynecology | Admitting: Obstetrics and Gynecology

## 2011-02-18 ENCOUNTER — Ambulatory Visit: Payer: Self-pay

## 2011-02-18 DIAGNOSIS — R928 Other abnormal and inconclusive findings on diagnostic imaging of breast: Secondary | ICD-10-CM

## 2011-02-24 ENCOUNTER — Telehealth: Payer: Self-pay

## 2011-02-24 NOTE — Telephone Encounter (Signed)
Pt calling for EEG results, I did read off what was in the report.  Also, she wanted to talk to you about an experience she had while having the EEG and to let you know she has not needed to take the med you prescribed yet because she has not needed to.  She wants to confirm does she only need to take when she feels a migraine or if she feels a panic like episode coming.

## 2011-02-25 ENCOUNTER — Ambulatory Visit: Payer: BLUE CROSS/BLUE SHIELD | Admitting: Neurology

## 2011-02-25 ENCOUNTER — Other Ambulatory Visit: Payer: BLUE CROSS/BLUE SHIELD

## 2011-02-25 NOTE — Telephone Encounter (Signed)
Pt aware and will stay off med until she has another episode.

## 2011-02-25 NOTE — Telephone Encounter (Signed)
I think she means the Elavil.  I prescribed it to see if it helped this feeling of "heaviness" she was having which I think is related to her migraines.  It is to be taken every night.  It is not typically used PRN.  Of course, is she feels better and she does not want to take it that is fine.  If there is another medication I prescribed then it is not in my records.

## 2011-02-26 ENCOUNTER — Ambulatory Visit: Payer: BLUE CROSS/BLUE SHIELD | Admitting: Neurology

## 2011-03-04 ENCOUNTER — Ambulatory Visit: Payer: BC Managed Care – PPO | Admitting: *Deleted

## 2011-03-04 ENCOUNTER — Encounter: Payer: Self-pay | Admitting: Internal Medicine

## 2011-03-04 ENCOUNTER — Ambulatory Visit (INDEPENDENT_AMBULATORY_CARE_PROVIDER_SITE_OTHER): Payer: BC Managed Care – PPO | Admitting: Internal Medicine

## 2011-03-04 VITALS — BP 110/62 | HR 89 | Ht 59.0 in | Wt 106.6 lb

## 2011-03-04 DIAGNOSIS — J45909 Unspecified asthma, uncomplicated: Secondary | ICD-10-CM

## 2011-03-04 MED ORDER — NEBULIZER COMPRESSOR KIT: PACK | Status: DC
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
## 2011-03-04 NOTE — Patient Instructions (Signed)
Order- script for nebulizer compressor- dx asthma    Print  - you can ask at a drug store what this would cost   Try saline nasal spray or gel, otc if needed for dry or irritated nose  Consider wearing a scarf like a muffler across your nose and mouth in cold air

## 2011-03-06 NOTE — Assessment & Plan Note (Signed)
Normal pulmonary function tests do not rule out asthma but in-flight consideration of alternative diagnoses, especially anxiety in her case. We will continue to work with the expectation that she has mild intermittent asthma. She wants to have a nebulizer machine and she can compare prices at [facility] versus home care company

## 2011-03-11 ENCOUNTER — Encounter: Payer: BC Managed Care – PPO | Attending: Internal Medicine | Admitting: *Deleted

## 2011-03-11 ENCOUNTER — Encounter: Payer: Self-pay | Admitting: *Deleted

## 2011-03-11 DIAGNOSIS — K9041 Non-celiac gluten sensitivity: Secondary | ICD-10-CM

## 2011-03-11 DIAGNOSIS — K900 Celiac disease: Secondary | ICD-10-CM | POA: Insufficient documentation

## 2011-03-11 DIAGNOSIS — Z713 Dietary counseling and surveillance: Secondary | ICD-10-CM | POA: Insufficient documentation

## 2011-03-11 NOTE — Progress Notes (Deleted)
Subjective:     Patient ID: [name], female   DOB: 1965/10/31, 45 y.o.   MRN: [id number]  HPI   Review of Systems     Objective:   Physical Exam     Assessment:     ***    Plan:     ***

## 2011-03-11 NOTE — Progress Notes (Signed)
  Medical Nutrition Therapy:  Appt start time: 1130 end time:  1230.   Assessment:  Primary concerns today:  Nutrition counseling for possible Gluten Intolerance. 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 She has history of Gestational Diabetes with her pregnancy and has occasional reactive hypoglycemia now. There is a mild lactose intolerance, though she can tolerate most cheese and yogurt. She is hoping to lose about 5 pounds, her most comfortable weight is right at 100 pounds.  MEDICATIONS: see list   DIETARY INTAKE:  Usual eating pattern includes 3 meals and 2-3 snacks per day.  Everyday foods include good variety of most food groups.  Avoided foods include red meat, fluid milk, some vegetables.    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   Nutritional Diagnosis:  NI-5.5 Imbalance of nutrients As related to gluten intolerence.  As evidenced by number of vitamins and minerals found in foods that contain gluten.    Intervention:  Nutrition counseling provided on Gluten Free Diet along with local and national resources available. Reviewed rationale for avoidance of gluten, meal preparation, eating in restaurants and APPS available for cell phone. She is happy to find that she enjoys most of the foods that are encouraged and avoids many of the foods listed already. Also suggested cooking with iron skillet to provide additional source of  iron through food preparation.  Handouts given during visit include:  Nutrition Complications for Celiac Disease by the [place] Celiac Association  Celiac Disease Nutrition Therapy by the American Dietetic Association  Multiple resources related to Celiac Disease  Monitoring/Evaluation:  Dietary intake, exercise, and body weight prn.

## 2011-03-25 ENCOUNTER — Encounter: Payer: Self-pay | Admitting: Internal Medicine

## 2011-03-25 ENCOUNTER — Ambulatory Visit (INDEPENDENT_AMBULATORY_CARE_PROVIDER_SITE_OTHER): Payer: BC Managed Care – PPO | Admitting: Internal Medicine

## 2011-03-25 DIAGNOSIS — K589 Irritable bowel syndrome without diarrhea: Secondary | ICD-10-CM

## 2011-03-25 DIAGNOSIS — N951 Menopausal and female climacteric states: Secondary | ICD-10-CM

## 2011-03-25 DIAGNOSIS — D649 Anemia, unspecified: Secondary | ICD-10-CM

## 2011-03-25 DIAGNOSIS — F3281 Premenstrual dysphoric disorder: Secondary | ICD-10-CM

## 2011-03-25 DIAGNOSIS — J309 Allergic rhinitis, unspecified: Secondary | ICD-10-CM

## 2011-03-25 DIAGNOSIS — N943 Premenstrual tension syndrome: Secondary | ICD-10-CM

## 2011-03-25 MED ORDER — CYANOCOBALAMIN 1000 MCG/ML IJ SOLN
1000.0000 ug | Freq: Once | INTRAMUSCULAR | Status: AC
  Administered 2011-03-25: 1000 ug via INTRAMUSCULAR

## 2011-03-25 MED ORDER — CITALOPRAM HYDROBROMIDE 10 MG PO TABS: 10.0000 mg | ORAL_TABLET | Freq: Every day | ORAL | Status: DC

## 2011-03-25 NOTE — Patient Instructions (Addendum)
The patient is instructed to continue all medications as prescribed. Schedule followup with check out clerk upon leaving the clinic Do not take the xanax or the elavil Use the celexa daily

## 2011-03-25 NOTE — Progress Notes (Signed)
Subjective:    Patient ID: [name], female    DOB: [birth year], 45 y.o.   MRN: [id number]  HPI Pt with increased anxiety, PMMD with increased mood issues with menstrual  Irregularity and mood swings Increased stress at work. Asthma stable   Review of Systems  Constitutional: Negative for activity change, appetite change and fatigue.  HENT: Negative for ear pain, congestion, neck pain, postnasal drip and sinus pressure.   Eyes: Negative for redness and visual disturbance.  Respiratory: Negative for cough, shortness of breath and wheezing.   Gastrointestinal: Negative for abdominal pain and abdominal distention.  Genitourinary: Negative for dysuria, frequency and menstrual problem.  Musculoskeletal: Negative for myalgias, joint swelling and arthralgias.  Skin: Negative for rash and wound.  Neurological: Negative for dizziness, weakness and headaches.  Hematological: Negative for adenopathy. Does not bruise/bleed easily.  Psychiatric/Behavioral: Negative for sleep disturbance and decreased concentration.   Past Medical History  Diagnosis Date  . Mitral valve disorders   . Contact dermatitis and other eczema, due to unspecified cause   . Esophageal reflux   . Asthma   . Lumbago   . IBS (irritable bowel syndrome)   . Diverticulosis   . Hemorrhoid   . Anxiety   . Anemia   . IC (interstitial cystitis)     History   Social History  . Marital Status: Married    Spouse Name: N/A    Number of Children: N/A  . Years of Education: N/A   Occupational History  . student    Social History Main Topics  . Smoking status: Never Smoker   . Smokeless tobacco: Never Used  . Alcohol Use: No  . Drug Use: No  . Sexually Active: Yes   Other Topics Concern  . Not on file   Social History Narrative  . No narrative on file    Past Surgical History  Procedure Date  . Cesarean section   . Sinus surgery with instatrak     Family History  Problem Relation Age of Onset  .  Colon polyps Sister   . Diabetes Mother   . Hypertension Mother   . Hypertension Father   . Hyperlipidemia Father     Allergies  Allergen Reactions  . Latex Shortness Of Breath and Itching  . Other Shortness Of Breath    peanuts  . Sulphadimidine Sodium (Sulfamethazine Sodium) Shortness Of Breath    Added to food for preservative  . Amoxicillin   . Asa Buff (Mag (Buffered Aspirin) Other (See Comments)    burning sensation  . Carisoprodol   . Ciprofloxacin     REACTION: SOB, "Tightness in head"  . Clarithromycin   . Codeine   . Epinephrine     REACTION: MVP  . Penicillins   . Sulfamethoxazole W/Trimethoprim   . Sulfonamide Derivatives     REACTION: itching, SOB    Current Outpatient Prescriptions on File Prior to Visit  Medication Sig Dispense Refill  . Lactobacillus (PROBIATA PO) Take by mouth daily.        . montelukast (SINGULAIR) 4 MG chewable tablet Chew 1 tablet (4 mg total) by mouth every evening.  30 tablet  2  . Multiple Vitamins-Minerals (MULTIVITAMIN WITH IRON-MINERALS) liquid Take 5 mLs by mouth daily.  236 mL  12  . amitriptyline (ELAVIL) 10 MG tablet Take 1 tablet (10 mg total) by mouth at bedtime. Start with 0.5 tab at night for two weeks then increase to 1tab at night from then on.  30 tablet  3  . mometasone (NASONEX) 50 MCG/ACT nasal spray 2 sprays by Nasal route as directed.         Current Facility-Administered Medications on File Prior to Visit  Medication Dose Route Frequency Provider Last Rate Last Dose  . DISCONTD: cyanocobalamin ((VITAMIN B-12)) injection 1,000 mcg  1,000 mcg Intramuscular Q30 days Georgetta Haber   1,000 mcg at 12/25/10 1140    BP 110/70  Pulse 68  Temp 98.1 F (36.7 C)  Resp 14  Ht 4\' 11"  (1.499 m)  Wt 104 lb (47.174 kg)  BMI 21.01 kg/m2       Objective:   Physical Exam  Nursing note and vitals reviewed. Constitutional: She is oriented to person, place, and time. She appears well-developed and well-nourished. No  distress.  HENT:  Head: Normocephalic and atraumatic.  Right Ear: External ear normal.  Left Ear: External ear normal.  Nose: Nose normal.  Mouth/Throat: Oropharynx is clear and moist.  Eyes: Conjunctivae and EOM are normal. Pupils are equal, round, and reactive to light.  Neck: Normal range of motion. Neck supple. No JVD present. No tracheal deviation present. No thyromegaly present.  Cardiovascular: Normal rate, regular rhythm, normal heart sounds and intact distal pulses.   No murmur heard. Pulmonary/Chest: Effort normal and breath sounds normal. She has no wheezes. She exhibits no tenderness.  Abdominal: Soft. Bowel sounds are normal.  Musculoskeletal: Normal range of motion. She exhibits no edema and no tenderness.  Lymphadenopathy:    She has no cervical adenopathy.  Neurological: She is alert and oriented to person, place, and time. She has normal reflexes. No cranial nerve deficit.  Skin: Skin is warm and dry. She is not diaphoretic.  Psychiatric: She has a normal mood and affect. Her behavior is normal.          Assessment & Plan:  PMMD Her low-dose Celexa discuss her allergy and asthma and agree with the discontinuation of all medications other than Nasonex.  Also will discuss B12 deficiency and possible gluten intolerance continue gluten free diet given B12 injection today

## 2011-03-25 NOTE — Progress Notes (Signed)
Addended by: Westley Hummer B on: 03/25/2011 12:06 PM   Modules accepted: Orders

## 2011-03-27 ENCOUNTER — Ambulatory Visit: Payer: BLUE CROSS/BLUE SHIELD | Admitting: Internal Medicine

## 2011-04-01 ENCOUNTER — Ambulatory Visit: Payer: Self-pay | Admitting: Neurology

## 2011-04-10 ENCOUNTER — Telehealth: Payer: Self-pay | Admitting: Neurology

## 2011-04-10 NOTE — Telephone Encounter (Signed)
Pt called to get appt before end of year because her insurance copays are going up in January. She had an appt scheduled earlier this month that she cancelled because she switched jobs, but she never rescheduled. She has Wednesday's off or she needs an appt after 3:30.

## 2011-04-10 NOTE — Telephone Encounter (Signed)
Left message for pt to call office. Appt left in original slot for now.

## 2011-04-10 NOTE — Telephone Encounter (Signed)
You can put her in at 8:00 Wednesday.  I can see her first and we can check her in after.

## 2011-04-15 NOTE — Telephone Encounter (Signed)
Pt did not call to switch appointment. Also did not come in at 8:00 am time for Wednesday 04/15/11. Original appointment still on schedule.

## 2011-05-27 ENCOUNTER — Ambulatory Visit (INDEPENDENT_AMBULATORY_CARE_PROVIDER_SITE_OTHER): Payer: BC Managed Care – PPO | Admitting: Internal Medicine

## 2011-05-27 ENCOUNTER — Encounter: Payer: Self-pay | Admitting: Internal Medicine

## 2011-05-27 VITALS — BP 110/70 | HR 80 | Temp 98.2°F | Resp 16 | Ht 59.0 in | Wt 104.0 lb

## 2011-05-27 DIAGNOSIS — J019 Acute sinusitis, unspecified: Secondary | ICD-10-CM

## 2011-05-27 DIAGNOSIS — K649 Unspecified hemorrhoids: Secondary | ICD-10-CM

## 2011-05-27 DIAGNOSIS — R300 Dysuria: Secondary | ICD-10-CM

## 2011-05-27 DIAGNOSIS — K589 Irritable bowel syndrome without diarrhea: Secondary | ICD-10-CM

## 2011-05-27 LAB — POCT URINALYSIS DIPSTICK
Bilirubin, UA: NEGATIVE
Leukocytes, UA: NEGATIVE
Nitrite, UA: NEGATIVE
Protein, UA: NEGATIVE
Urobilinogen, UA: 1
pH, UA: 6.5

## 2011-05-27 MED ORDER — DOXYCYCLINE HYCLATE 100 MG PO TABS: 100.0000 mg | ORAL_TABLET | Freq: Two times a day (BID) | ORAL | Status: AC

## 2011-05-27 NOTE — Progress Notes (Signed)
Subjective:    Patient ID: [name], female    DOB: 17-Dec-1965, 46 y.o.   MRN: [id number]  HPI Increased hemorrhoidal problems and constipation Asthma stable sinusitis like symptoms or her primary complaint today with nasal congestion postnasal drip moderate sore throat moderate headache.  She was recently discontinued many of her medications that she takes for her asthma, and she notes that her asthmatic .    Review of Systems  Constitutional: Negative for activity change, appetite change and fatigue.  HENT: Negative for ear pain, congestion, neck pain, postnasal drip and sinus pressure.   Eyes: Negative for redness and visual disturbance.  Respiratory: Negative for cough, shortness of breath and wheezing.   Gastrointestinal: Negative for abdominal pain and abdominal distention.  Genitourinary: Negative for dysuria, frequency and menstrual problem.  Musculoskeletal: Negative for myalgias, joint swelling and arthralgias.  Skin: Negative for rash and wound.  Neurological: Negative for dizziness, weakness and headaches.  Hematological: Negative for adenopathy. Does not bruise/bleed easily.  Psychiatric/Behavioral: Negative for sleep disturbance and decreased concentration.   Past Medical History  Diagnosis Date  . Mitral valve disorders   . Contact dermatitis and other eczema, due to unspecified cause   . Esophageal reflux   . Asthma   . Lumbago   . IBS (irritable bowel syndrome)   . Diverticulosis   . Hemorrhoid   . Anxiety   . Anemia   . IC (interstitial cystitis)     History   Social History  . Marital Status: Married    Spouse Name: N/A    Number of Children: N/A  . Years of Education: N/A   Occupational History  . student    Social History Main Topics  . Smoking status: Never Smoker   . Smokeless tobacco: Never Used  . Alcohol Use: No  . Drug Use: No  . Sexually Active: Yes   Other Topics Concern  . Not on file   Social History Narrative  . No  narrative on file    Past Surgical History  Procedure Date  . Cesarean section   . Sinus surgery with instatrak     Family History  Problem Relation Age of Onset  . Colon polyps Sister   . Diabetes Mother   . Hypertension Mother   . Hypertension Father   . Hyperlipidemia Father     Allergies  Allergen Reactions  . Latex Shortness Of Breath and Itching  . Other Shortness Of Breath    peanuts  . Sulphadimidine Sodium (Sulfamethazine Sodium) Shortness Of Breath    Added to food for preservative  . Amoxicillin   . Asa Buff (Mag (Buffered Aspirin) Other (See Comments)    burning sensation  . Carisoprodol   . Ciprofloxacin     REACTION: SOB, "Tightness in head"  . Clarithromycin   . Codeine   . Epinephrine     REACTION: MVP  . Penicillins   . Sulfamethoxazole W/Trimethoprim   . Sulfonamide Derivatives     REACTION: itching, SOB    Current Outpatient Prescriptions on File Prior to Visit  Medication Sig Dispense Refill  . Lactobacillus (PROBIATA PO) Take by mouth daily.        . mometasone (NASONEX) 50 MCG/ACT nasal spray 2 sprays by Nasal route as directed.        . Multiple Vitamins-Minerals (MULTIVITAMIN WITH IRON-MINERALS) liquid Take 5 mLs by mouth daily.  236 mL  12    BP 110/70  Pulse 80  Temp 98.2 F (36.8 C)  Resp 16  Ht 4\' 11"  (1.499 m)  Wt 104 lb (47.174 kg)  BMI 21.01 kg/m2       Objective:   Physical Exam  Nursing note and vitals reviewed. Constitutional: She is oriented to person, place, and time. She appears well-developed and well-nourished. No distress.  HENT:  Head: Normocephalic and atraumatic.  Right Ear: External ear normal.  Left Ear: External ear normal.  Nose: Nose normal.  Mouth/Throat: Oropharynx is clear and moist.  Eyes: Conjunctivae and EOM are normal. Pupils are equal, round, and reactive to light.  Neck: Normal range of motion. Neck supple. No JVD present. No tracheal deviation present. No thyromegaly present.    Cardiovascular: Normal rate, regular rhythm, normal heart sounds and intact distal pulses.   No murmur heard. Pulmonary/Chest: Effort normal and breath sounds normal. She has no wheezes. She exhibits no tenderness.  Abdominal: Soft. Bowel sounds are normal.  Musculoskeletal: Normal range of motion. She exhibits no edema and no tenderness.  Lymphadenopathy:    She has no cervical adenopathy.  Neurological: She is alert and oriented to person, place, and time. She has normal reflexes. No cranial nerve deficit.  Skin: Skin is warm and dry. She is not diaphoretic.  Psychiatric: She has a normal mood and affect. Her behavior is normal.          Assessment & Plan:  Patient's asthma is stable off many of her medications.  The patient has always had a component of anxiety with her asthma and I believe it is very appropriate and she is weaning herself off her medications and is doing well I would encourage her to continue to stay off the medications if possible monitor the use of rescue inhaler if needed and report back to [place] before resuming any medications.  The patient has chronic sinusitis at this point I would recommend that she use saline lavage Mucinex and doxycycline 100 by mouth twice a day for 14 days. The patient is to continue the use of probiotics for her irritable bowel

## 2011-05-27 NOTE — Patient Instructions (Addendum)
The patient is instructed to continue all medications as prescribed. Schedule followup with check out clerk upon leaving the clinic increase the probiotics to BID

## 2011-06-24 ENCOUNTER — Telehealth: Payer: Self-pay | Admitting: Internal Medicine

## 2011-06-24 NOTE — Telephone Encounter (Signed)
It is best if she has a formal evaluation for these complaints. Tell her that I do recommend she see a mid-level for evaluation in order to expedite treatment. We cannot treat her over the phone without a clear understanding of why she is having pain and bleeding.

## 2011-06-24 NOTE — Telephone Encounter (Signed)
Pt last colon done 02/05/95. Last OV with Dr. Henrene Pastor 05/09/10. Pt states she saw her PCP and he suggested she see Dr. Henrene Pastor. Pt has scheduled an appt with Dr. Henrene Pastor in March but would like to know if he has any advice for her prior to her appt. Offered her a midlevel appt but she would prefer to see Dr. Henrene Pastor. Pt states she has had constipation and states she has been having rectal pain with her bowel movements, states she has also had some rectal bleeding. Reports that she is using some cream on her rectum because after the bowel movement she feels a scratchy, burning pain. Dr. Henrene Pastor please advise.

## 2011-06-24 NOTE — Telephone Encounter (Signed)
Spoke with pt and she is aware. Pt would like to see midlevel next week on Wed. Let pt know I would call her back at the end of the week for an appt.

## 2011-06-25 NOTE — Telephone Encounter (Signed)
Pt scheduled to see Amy Esterwood PA 07/01/11@1 :30pm. Pt aware of appt date and time.

## 2011-07-01 ENCOUNTER — Encounter: Payer: Self-pay | Admitting: Physician Assistant

## 2011-07-01 ENCOUNTER — Encounter: Payer: Self-pay | Admitting: Internal Medicine

## 2011-07-01 ENCOUNTER — Ambulatory Visit (INDEPENDENT_AMBULATORY_CARE_PROVIDER_SITE_OTHER): Payer: BC Managed Care – PPO | Admitting: Physician Assistant

## 2011-07-01 DIAGNOSIS — K5900 Constipation, unspecified: Secondary | ICD-10-CM

## 2011-07-01 DIAGNOSIS — K5730 Diverticulosis of large intestine without perforation or abscess without bleeding: Secondary | ICD-10-CM

## 2011-07-01 DIAGNOSIS — R110 Nausea: Secondary | ICD-10-CM

## 2011-07-01 DIAGNOSIS — K5790 Diverticulosis of intestine, part unspecified, without perforation or abscess without bleeding: Secondary | ICD-10-CM | POA: Insufficient documentation

## 2011-07-01 DIAGNOSIS — R1032 Left lower quadrant pain: Secondary | ICD-10-CM

## 2011-07-01 DIAGNOSIS — K625 Hemorrhage of anus and rectum: Secondary | ICD-10-CM

## 2011-07-01 MED ORDER — PEG-KCL-NACL-NASULF-NA ASC-C 100 G PO SOLR: ORAL | Status: DC

## 2011-07-01 NOTE — Patient Instructions (Signed)
We scheduled the colonoscopy tentatively for 08-20-2011. You can call us [phone] to reschedule.  We have given you prilosec over the counter, take 1 capsule before breakfast.  We have also given you samples of a probiotic Align, take 1 capsule daily for 21 days.

## 2011-07-01 NOTE — Progress Notes (Signed)
Subjective:    Patient ID: [name], female    DOB: [birth year], 46 y.o.   MRN: [id number]  HPI [name] is a 46 year old white female known to Dr. Henrene Pastor remotely. She had colonoscopy done in 1996 which showed sigmoid diverticulosis and moderate internal hemorrhoids. She had an endoscopy done in 1995 which showed some gastritis.  Patient comes in today with new complaints of constipation. She says she has had progressive difficulty with constipation over the past year or so and had an episode about a month ago with constipation followed by a very hard painful bowel movement which caused  fairly intense rectal pain. After that she had some bleeding which has since resolved. She says she does feel that she gets some rectal irritation intermittently as well. Says at  Times  she has a pressure-like sensation in her rectum as well  She is also concerned about some intermittent left-sided abdominal pain and says she has an area in her left abdomen it seems to get firm at times and uncomfortable and she is concerned that she may have a hernia. She relates her episodes of constipation to her menstrual cycle and says usually she has had normal bowel movements during her menses which are very heavy and then has constipation during the other times a month. She's also been experiencing a lot of gas and belching and burping and some heartburn but has just completed a course of doxycycline, which she said also made her nauseated.    Review of Systems  Constitutional: Negative.   HENT: Negative.   Eyes: Negative.   Respiratory: Negative.   Cardiovascular: Negative.   Gastrointestinal: Positive for abdominal pain and constipation.  Genitourinary: Negative.   Musculoskeletal: Negative.   Skin: Negative.   Neurological: Negative.   Psychiatric/Behavioral: Negative.    Outpatient Prescriptions Prior to Visit  Medication Sig Dispense Refill  . Evening Primrose Oil 1000 MG CAPS Take 1,000 mg by mouth  daily.      . Lactobacillus (PROBIATA PO) Take by mouth daily.        . mometasone (NASONEX) 50 MCG/ACT nasal spray 2 sprays by Nasal route as directed.        . montelukast (SINGULAIR) 4 MG chewable tablet Chew 4 mg by mouth daily as needed.      . Multiple Vitamins-Minerals (MULTIVITAMIN WITH IRON-MINERALS) liquid Take 5 mLs by mouth daily.  236 mL  12      Allergies  Allergen Reactions  . Latex Shortness Of Breath and Itching  . Other Shortness Of Breath    peanuts  . Sulphadimidine Sodium (Sulfamethazine Sodium) Shortness Of Breath    Added to food for preservative  . Amoxicillin   . Asa Buff (Mag (Buffered Aspirin) Other (See Comments)    burning sensation  . Carisoprodol   . Ciprofloxacin     REACTION: SOB, "Tightness in head"  . Clarithromycin   . Codeine   . Epinephrine     REACTION: MVP  . Penicillins   . Sulfamethoxazole W/Trimethoprim   . Sulfonamide Derivatives     REACTION: itching, SOB   Patient Active Problem List  Diagnoses  . MITRAL VALVE PROLAPSE  . ALLERGIC RHINITIS  . Asthma, exogenous  . ESOPHAGEAL REFLUX  . DERMATITIS  . LOW BACK PAIN  . ANEMIA  . IRRITABLE BOWEL SYNDROME  . FLATULENCE-GAS-BLOATING  . Fibroids  . Perimenopausal  . Diverticulosis    Objective:   Physical Exam well-developed white female in no acute distress, pleasant blood pressure  112/62 pulse 88. HEENT; non-from normocephalic EOMI PERRLA sclera anicteric,Neck; Supple no JVD, Cardiovascular; regular rate and rhythm with S1-S2 no murmur or gallop come Pulmonary; clear bilaterally, Abdomen; soft she is mildly tender in the left mid quadrant no guarding no rebound no palpable mass or hepatosplenomegaly bowel sounds are active. No palpable ventral hernia. Rectal; exam no external lesion noted she is Hemoccult-negative. Extremities no clubbing cyanosis or edema skin warm dry, Psych; anxious but mood and affect otherwise appropriate        Assessment & Plan:  #23 46 year old female  with new onset of constipation and intermittent left-sided abdominal pain. Her symptoms may be secondary to functional constipation and IBS, she does have diverticular disease as well but has not had any episodes that sound like diverticulitis to me. Her last colonoscopy was remote and therefore cannot rule out occult colon lesion. #2 nausea and indigestion, recent likely secondary to antibiotics #3 hematochezia and rectal pain onset one month ago and resolved. Rectal exam is negative and I suspect she had an anal fissure which has  healed  Plan; scheduled for colonoscopy with Dr. Henrene Pastor, procedure was discussed in detail with the patient and she is agreeable to proceed. Add Align  one by mouth daily She was advised she could use when necessary over-the-counter Dulcolax Samples of Prilosec 20 mg were given to the patient and she is asked to use these one daily over the next 10-14 days for her dyspepsia.

## 2011-07-03 NOTE — Progress Notes (Signed)
Agree with Ms. Esterwood's assessment and plan. Forest Pruden E. Derold Dorsch, MD, FACG   

## 2011-07-22 ENCOUNTER — Ambulatory Visit: Payer: BC Managed Care – PPO | Admitting: Internal Medicine

## 2011-08-19 ENCOUNTER — Encounter: Payer: Self-pay | Admitting: Internal Medicine

## 2011-08-19 ENCOUNTER — Ambulatory Visit (INDEPENDENT_AMBULATORY_CARE_PROVIDER_SITE_OTHER): Payer: BC Managed Care – PPO | Admitting: Internal Medicine

## 2011-08-19 VITALS — BP 110/70 | HR 72 | Temp 98.3°F | Resp 14 | Ht 60.0 in | Wt 104.0 lb

## 2011-08-19 DIAGNOSIS — K589 Irritable bowel syndrome without diarrhea: Secondary | ICD-10-CM

## 2011-08-19 DIAGNOSIS — R141 Gas pain: Secondary | ICD-10-CM

## 2011-08-19 DIAGNOSIS — J45909 Unspecified asthma, uncomplicated: Secondary | ICD-10-CM

## 2011-08-19 DIAGNOSIS — K219 Gastro-esophageal reflux disease without esophagitis: Secondary | ICD-10-CM

## 2011-08-19 DIAGNOSIS — D518 Other vitamin B12 deficiency anemias: Secondary | ICD-10-CM

## 2011-08-19 DIAGNOSIS — D519 Vitamin B12 deficiency anemia, unspecified: Secondary | ICD-10-CM

## 2011-08-19 DIAGNOSIS — D649 Anemia, unspecified: Secondary | ICD-10-CM

## 2011-08-19 LAB — CBC WITH DIFFERENTIAL/PLATELET
Basophils Absolute: 0 10*3/uL (ref 0.0–0.1)
Eosinophils Absolute: 0.1 10*3/uL (ref 0.0–0.7)
Hemoglobin: 10.4 g/dL — ABNORMAL LOW (ref 12.0–15.0)
Lymphocytes Relative: 46.9 % — ABNORMAL HIGH (ref 12.0–46.0)
MCHC: 33.1 g/dL (ref 30.0–36.0)
Monocytes Relative: 10.3 % (ref 3.0–12.0)
Neutrophils Relative %: 39.1 % — ABNORMAL LOW (ref 43.0–77.0)
Platelets: 314 10*3/uL (ref 150.0–400.0)
RDW: 14.7 % — ABNORMAL HIGH (ref 11.5–14.6)

## 2011-08-19 MED ORDER — BECLOMETHASONE DIPROPIONATE 80 MCG/ACT NA AERS: 1.0000 | INHALATION_SPRAY | Freq: Every day | NASAL | Status: DC

## 2011-08-19 MED ORDER — CYANOCOBALAMIN 1000 MCG/ML IJ SOLN
1000.0000 ug | INTRAMUSCULAR | Status: AC
  Administered 2011-08-19: 1000 ug via INTRAMUSCULAR

## 2011-08-19 NOTE — Progress Notes (Signed)
Subjective:    Patient ID: [name], female    DOB: 1965-06-24, 46 y.o.   MRN: [id number]  HPI Pt was seen by GI  For bleeding and for IBS with hx of diverticulosis. The GI consultant recommended a colon for screening although the pt reports that the stood tests were heme negative. She is concerned about waiting or having it done. She is on probiotic but "ran out of them" She has increased nausea and cramps in abdomen with mentruation Allergy flair.   Review of Systems  Constitutional: Negative for activity change, appetite change and fatigue.  HENT: Negative for ear pain, congestion, neck pain, postnasal drip and sinus pressure.   Eyes: Negative for redness and visual disturbance.  Respiratory: Negative for cough, shortness of breath and wheezing.   Gastrointestinal: Negative for abdominal pain and abdominal distention.  Genitourinary: Negative for dysuria, frequency and menstrual problem.  Musculoskeletal: Negative for myalgias, joint swelling and arthralgias.  Skin: Negative for rash and wound.  Neurological: Negative for dizziness, weakness and headaches.  Hematological: Negative for adenopathy. Does not bruise/bleed easily.  Psychiatric/Behavioral: Negative for sleep disturbance and decreased concentration.   Past Medical History  Diagnosis Date  . Mitral valve disorders   . Contact dermatitis and other eczema, due to unspecified cause   . Esophageal reflux   . Asthma   . Lumbago   . IBS (irritable bowel syndrome)   . Diverticulosis   . Hemorrhoid   . Anxiety   . Anemia   . IC (interstitial cystitis)     History   Social History  . Marital Status: Married    Spouse Name: N/A    Number of Children: N/A  . Years of Education: N/A   Occupational History  . [employer]     Social History Main Topics  . Smoking status: Never Smoker   . Smokeless tobacco: Never Used  . Alcohol Use: No  . Drug Use: No  . Sexually Active: Yes   Other Topics Concern  . Not on  file   Social History Narrative  . No narrative on file    Past Surgical History  Procedure Date  . Cesarean section   . Sinus surgery with instatrak   . Mouth surgery     Family History  Problem Relation Age of Onset  . Colon polyps Sister   . Diabetes Mother   . Hypertension Mother   . Hypertension Father   . Hyperlipidemia Father   . Alcohol abuse      Family history  . Colon cancer Neg Hx     Allergies  Allergen Reactions  . Latex Shortness Of Breath and Itching  . Other Shortness Of Breath    peanuts  . Sulphadimidine Sodium (Sulfamethazine Sodium) Shortness Of Breath    Added to food for preservative  . Amoxicillin   . Asa Buff (Mag (Buffered Aspirin) Other (See Comments)    burning sensation  . Carisoprodol   . Ciprofloxacin     REACTION: SOB, "Tightness in head"  . Clarithromycin   . Codeine   . Epinephrine     REACTION: MVP  . Penicillins   . Sulfamethoxazole W/Trimethoprim   . Sulfonamide Derivatives     REACTION: itching, SOB    Current Outpatient Prescriptions on File Prior to Visit  Medication Sig Dispense Refill  . Evening Primrose Oil 1000 MG CAPS Take 1,000 mg by mouth daily.      . Lactobacillus (PROBIATA PO) Take by mouth daily.        Marland Kitchen  mometasone (NASONEX) 50 MCG/ACT nasal spray 2 sprays by Nasal route as directed.        . montelukast (SINGULAIR) 4 MG chewable tablet Chew 4 mg by mouth daily as needed.      . Multiple Vitamins-Minerals (MULTIVITAMIN WITH IRON-MINERALS) liquid Take 5 mLs by mouth daily.  236 mL  12  . peg 3350 powder (MOVIPREP) 100 G SOLR Take as directed.  Moviprep  1 kit  0   No current facility-administered medications on file prior to visit.    BP 110/70  Pulse 72  Temp 98.3 F (36.8 C)  Resp 14  Ht 5' (1.524 m)  Wt 104 lb (47.174 kg)  BMI 20.31 kg/m2       Objective:   Physical Exam  Nursing note and vitals reviewed. Constitutional: She is oriented to person, place, and time. She appears  well-developed and well-nourished. No distress.  HENT:  Head: Normocephalic and atraumatic.  Right Ear: External ear normal.  Left Ear: External ear normal.  Nose: Nose normal.  Mouth/Throat: Oropharynx is clear and moist.  Eyes: Conjunctivae and EOM are normal. Pupils are equal, round, and reactive to light.  Neck: Normal range of motion. Neck supple. No JVD present. No tracheal deviation present. No thyromegaly present.  Cardiovascular: Normal rate, regular rhythm, normal heart sounds and intact distal pulses.   No murmur heard. Pulmonary/Chest: Effort normal and breath sounds normal. She has no wheezes. She exhibits no tenderness.  Abdominal: Soft. Bowel sounds are normal.  Musculoskeletal: Normal range of motion. She exhibits no edema and no tenderness.  Lymphadenopathy:    She has no cervical adenopathy.  Neurological: She is alert and oriented to person, place, and time. She has normal reflexes. No cranial nerve deficit.  Skin: Skin is warm and dry. She is not diaphoretic.  Psychiatric: She has a normal mood and affect. Her behavior is normal.          Assessment & Plan:  Since her symptoms have largely rule resolved we will check a CBC with differential and will proceed with stool cards on a yearly basis until she turns age 57 and her colonoscopy would be paid for but as a screening test.  If the stool cards are positive or her symptoms worsen then we certainly would consider a colonoscopy but at this time I think that there is an alternative explanation for the rectal blood that she saw and It is a reasonable alternative approach to monitoring stool over the next 4 years until her colonoscopy.  We want to resume in the probiotics so that acute her stool softer and less gas again this would help not to irritate him little tissue and create the same problem as the blood she saw per rectum.  At age 2 she had hemorrhoids and mild diverticuli  Asthma flair with allergies

## 2011-08-19 NOTE — Patient Instructions (Signed)
The patient is instructed to continue all medications as prescribed. Schedule followup with check out clerk upon leaving the clinic  

## 2011-08-20 ENCOUNTER — Encounter: Payer: BC Managed Care – PPO | Admitting: Internal Medicine

## 2011-08-26 ENCOUNTER — Encounter: Payer: Self-pay | Admitting: Internal Medicine

## 2011-08-26 ENCOUNTER — Ambulatory Visit (INDEPENDENT_AMBULATORY_CARE_PROVIDER_SITE_OTHER): Payer: BC Managed Care – PPO | Admitting: Internal Medicine

## 2011-08-26 VITALS — BP 98/70 | HR 97 | Ht 59.0 in | Wt 106.8 lb

## 2011-08-26 DIAGNOSIS — J45909 Unspecified asthma, uncomplicated: Secondary | ICD-10-CM

## 2011-08-26 DIAGNOSIS — J301 Allergic rhinitis due to pollen: Secondary | ICD-10-CM

## 2011-08-26 DIAGNOSIS — K219 Gastro-esophageal reflux disease without esophagitis: Secondary | ICD-10-CM

## 2011-08-26 NOTE — Progress Notes (Signed)
Patient ID: [name], female    DOB: [birth year], 46 y.o.   MRN: [id number]  HPI 11/21/10 Hosp 1 year ago- dehydration, heat exhaustion, low iron level.  Thinks chronic singulair is bothering stomach. After long time use, then a hiatus, she took one and had diarrhea.  Does saline nasal rinse/ squeeze bottle every morning.  Feels tightness in chest off and on. Some dry cough and tight chest. Always very sensitive to stimulants, including Atrovent HFA, because of tachypalpitation she relates to mitral prolapse.   03/04/11-  68 yoF never smoker returning to this area after last seen 2009. Hx allergic rhinitis, asthma, anxiety. She has had flu vaccine. Has a new job doing Journalist, newspaper. Cold air is tightening in her chest some when she goes outdoors. Occasional dry, tight cough. We have prescribed a nebulizer machine but she turned back in because her insurance wouldn't cover it. She is taking Singulair 4 mg every other day and emphasizes her intolerance to many medications. Minor epistaxis, using saline gel. Still has some left over Tilade nebulizer solution but we discussed options. She volunteers that she is now recognizing the importance of anxiety with many of her respiratory symptoms. PFT 11/26/2010 within normal limits within significant response to bronchodilator. FEV1/FVC 0.79. Results were reviewed with her, including a lengthy discussion of the significance of the different subtests.  08/26/11 -46 yoF never smoker returning to this area after last seen 2009. Hx allergic rhinitis, asthma, anxiety. " breathing too well"; saw Dr Arnoldo Morale last week and breathing heavy. unsure if due to exercise or weather Joined a gym class for exercise. Using Qnasl which she understood to be for nose and chest. Has an old nebulizer machine. She credits Singulair with helping reduce her "inflammation". Little cough, wheeze, phlegm.  Review of Systems-see HPI Constitutional:   No-   weight loss, night  sweats, fevers, chills, fatigue, lassitude. HEENT:   No-  headaches, difficulty swallowing, tooth/dental problems, sore throat,       No-  sneezing, itching, ear ache, nasal congestion, post nasal drip,  CV:  No-   chest pain, orthopnea, PND, swelling in lower extremities, anasarca, dizziness, palpitations Resp: +shortness of breath with exertion or at rest.              No-   productive cough,  No non-productive cough,  No- coughing up of blood.              No-   change in color of mucus.  No recent  wheezing.   Skin: No-   rash or lesions. GI:  No-   heartburn, indigestion, abdominal pain, nausea, vomiting, GU:  MS:  No-   joint pain or swelling.   Neuro-     nothing unusual Psych:  No- change in mood or affect. No depression + anxiety.  No memory loss.   Objective:   Physical Exam General- Alert, Oriented, Affect-anxious/ talkative, Distress- none acute   wdwn comfortable lady Skin- rash-none, lesions- none, excoriation- none Lymphadenopathy- none Head- atraumatic            Eyes- Gross vision intact, PERRLA, conjunctivae clear secretions            Ears- Hearing, canals            Nose- Narrow with mucus bridging, No- Septal dev, mucus, polyps, erosion, perforation             Throat- Mallampati II , mucosa clear , drainage- none, tonsils- atrophic  Neck- flexible , trachea midline, no stridor , thyroid nl, carotid no bruit Chest - symmetrical excursion , unlabored           Heart/CV- RRR , no murmur , no gallop  , no rub, nl s1 s2                           - JVD- none , edema- none, stasis changes- none, varices- none           Lung- clear to P&A, wheeze- none, cough- none , dullness-none, rub- none           Chest wall-  Abd-  Br/ Gen/ Rectal- Not done, not indicated Extrem- cyanosis- none, clubbing, none, atrophy- none, strength- nl Neuro- grossly intact to observation

## 2011-08-26 NOTE — Patient Instructions (Signed)
Ok to continue Qnasl- 2 puffs each nostril once daily at bedtime  If heart burn is bothering- try otc Pepcid 20 mg once daily before a meal

## 2011-08-29 NOTE — Assessment & Plan Note (Signed)
She believes Singulair helps. She understood that Qnasl nasal spray would help nose and chest and that was reassuring to her. There may be a pollen component with increased symptoms now but it is relatively mild. Reassurance should be sufficient.

## 2011-08-29 NOTE — Assessment & Plan Note (Signed)
Mild increased heartburn. This is contributing to chest discomfort. We discussed use of acid blockers.

## 2011-08-29 NOTE — Assessment & Plan Note (Signed)
Seasonal and perennial rhinitis

## 2011-08-31 ENCOUNTER — Other Ambulatory Visit: Payer: Self-pay | Admitting: *Deleted

## 2011-09-01 ENCOUNTER — Other Ambulatory Visit: Payer: Self-pay | Admitting: Internal Medicine

## 2011-09-01 MED ORDER — HYDROCORTISONE ACETATE 25 MG RE SUPP: 25.0000 mg | Freq: Two times a day (BID) | RECTAL | Status: AC | PRN

## 2011-09-01 NOTE — Telephone Encounter (Signed)
anusol sent

## 2011-09-01 NOTE — Telephone Encounter (Signed)
Pt stated pharm is [facility] on [address] in [place]

## 2011-09-01 NOTE — Telephone Encounter (Signed)
lapt never returned call to tell me which [facility] in [place]- when pt do3es return call may have anusol hc sup

## 2011-09-02 ENCOUNTER — Encounter: Payer: BC Managed Care – PPO | Admitting: Internal Medicine

## 2011-09-02 ENCOUNTER — Ambulatory Visit: Payer: BC Managed Care – PPO | Admitting: Internal Medicine

## 2011-09-09 ENCOUNTER — Telehealth: Payer: Self-pay | Admitting: Internal Medicine

## 2011-09-10 MED ORDER — HYDROCORTISONE ACETATE 25 MG RE SUPP: 25.0000 mg | Freq: Two times a day (BID) | RECTAL | Status: AC

## 2011-09-10 NOTE — Telephone Encounter (Signed)
Pt aware and rx sent to pharmacy. Pt to call back regarding colonoscopy.

## 2011-09-10 NOTE — Telephone Encounter (Signed)
You can prescribe Anusol-HC suppositories. Also, she does not need to see me in the office. She should be scheduled for direct colonoscopy as previously recommended. Important for her not to cancel.

## 2011-09-10 NOTE — Telephone Encounter (Signed)
Pt last seen by Nicoletta Ba PA 07/01/11. Pt c/o rectal pain and some bleeding at that time. Pt was scheduled for a colon with Dr. Henrene Pastor for 09/02/11 but she cancelled the appt. Pt states she is having problems with hemorrhoids. Requesting to be seen sooner than scheduled appt with Dr. Henrene Pastor. Offered pt an appt with Nicoletta Ba PA for Monday but pt states she can only come on Wed, there are no appts on Wed. Discussed with pt using sitz bath, Preparation H and tucks pads. Pt wanted to know if Dr. Henrene Pastor had any other recommendations. Please advise.

## 2011-09-10 NOTE — Telephone Encounter (Signed)
Left message for pt to call back  °

## 2011-09-14 ENCOUNTER — Telehealth: Payer: Self-pay | Admitting: Internal Medicine

## 2011-09-14 ENCOUNTER — Telehealth: Payer: Self-pay

## 2011-09-14 NOTE — Telephone Encounter (Signed)
Pt wanted to schedule appt with Dr. Henrene Pastor. Let pt know that Dr. Henrene Pastor stated she did not need an OV just a direct colon. Pt states she wants to come in and discuss the procedure, she has lots of questions. Offered for pt to come in and meet with midlevel. Offered several appts but pt states she can only do Wed. Pt states she will call back at the end of the week to possibly schedule an appt next Wed.

## 2011-09-14 NOTE — Telephone Encounter (Signed)
Pt requesting an appt on a Wed. Pt scheduled to see Tye Savoy NP 09/23/11@10am . Left message for pt to call back regarding appt date and time.  Spoke with pt and she requested to change the appt time to 11am. Pt aware.

## 2011-09-14 NOTE — Telephone Encounter (Signed)
Left message for pt to call back  °

## 2011-09-23 ENCOUNTER — Encounter: Payer: Self-pay | Admitting: Internal Medicine

## 2011-09-23 ENCOUNTER — Ambulatory Visit (INDEPENDENT_AMBULATORY_CARE_PROVIDER_SITE_OTHER): Payer: BC Managed Care – PPO | Admitting: Nurse Practitioner

## 2011-09-23 ENCOUNTER — Encounter: Payer: Self-pay | Admitting: Nurse Practitioner

## 2011-09-23 ENCOUNTER — Ambulatory Visit: Payer: BC Managed Care – PPO | Admitting: Nurse Practitioner

## 2011-09-23 VITALS — HR 100 | Ht 60.0 in | Wt 104.4 lb

## 2011-09-23 DIAGNOSIS — R1904 Left lower quadrant abdominal swelling, mass and lump: Secondary | ICD-10-CM

## 2011-09-23 DIAGNOSIS — K5900 Constipation, unspecified: Secondary | ICD-10-CM

## 2011-09-23 MED ORDER — MOVIPREP 100 G PO SOLR: ORAL | Status: DC

## 2011-09-23 NOTE — Patient Instructions (Signed)
We have rescheduled the colonoscopy with Dr. Scarlette Shorts. To 11-25-2011.  Call us at [phone] if you want to check to see if there are any earlier morning appointments.   We have given you the Moviprep prescription to take to your pharmacy. We have also given you a $20.00 coupon.

## 2011-09-23 NOTE — Progress Notes (Addendum)
Raechelle Feurtado [id number] 1966/02/07   HISTORY OR PRESENT ILLNESS :  Patient is a 46 year old female seen by Nicoletta Ba, PA-C late March for evaluation of constipation and LLQ pain. She was subsequently scheduled for a colonoscopy with Dr. Henrene Pastor but cancelled. She wanted to come in today because of questions / concerns about the procedure. Patient worried about IV access. She wants smallest needle size available. She requests a local anesthetic be applied to site prior to IV insertion. She is concerned about becoming dehydrated with bowel prep  Current Medications, Allergies, Past Medical History, Past Surgical History, Family History and Social History were reviewed in [employer] record.   PHYSICAL EXAMINATION : General:  Well developed  female in no acute distress Neurological: Oriented x 4, grossly nonfocal Psychological:  Alert and cooperative. Normal mood and affect  ASSESSMENT AND PLAN :    LLQ pain and constipation, patient was for colonoscopy with Dr. Henrene Pastor but cancelled procedure because of questions / concern. We discussed her concerns today which predominantly centered around IV access. I spoke to [place] in [facility] about possibly using smallest needle available. In addition, patient would like a local anesthetic applied to IV site prior to insertion. All of patient's questions were answered. mb arm.   Addendum: Reviewed and agree with initial management. Jerene Bears, MD

## 2011-09-24 ENCOUNTER — Encounter: Payer: Self-pay | Admitting: Nurse Practitioner

## 2011-10-21 ENCOUNTER — Encounter: Payer: Self-pay | Admitting: Internal Medicine

## 2011-10-21 ENCOUNTER — Ambulatory Visit (INDEPENDENT_AMBULATORY_CARE_PROVIDER_SITE_OTHER): Payer: BC Managed Care – PPO | Admitting: Internal Medicine

## 2011-10-21 VITALS — BP 86/64 | HR 76 | Ht 60.0 in | Wt 103.2 lb

## 2011-10-21 DIAGNOSIS — R141 Gas pain: Secondary | ICD-10-CM

## 2011-10-21 DIAGNOSIS — R1310 Dysphagia, unspecified: Secondary | ICD-10-CM

## 2011-10-21 DIAGNOSIS — K5900 Constipation, unspecified: Secondary | ICD-10-CM

## 2011-10-21 DIAGNOSIS — R1084 Generalized abdominal pain: Secondary | ICD-10-CM

## 2011-10-21 DIAGNOSIS — R143 Flatulence: Secondary | ICD-10-CM

## 2011-10-21 DIAGNOSIS — R198 Other specified symptoms and signs involving the digestive system and abdomen: Secondary | ICD-10-CM

## 2011-10-21 DIAGNOSIS — K219 Gastro-esophageal reflux disease without esophagitis: Secondary | ICD-10-CM

## 2011-10-21 DIAGNOSIS — K625 Hemorrhage of anus and rectum: Secondary | ICD-10-CM

## 2011-10-21 MED ORDER — ALIGN PO CAPS: 1.0000 | ORAL_CAPSULE | Freq: Every day | ORAL | Status: DC

## 2011-10-21 MED ORDER — MOVIPREP 100 G PO SOLR: 1.0000 | Freq: Once | ORAL | Status: DC

## 2011-10-21 NOTE — Patient Instructions (Addendum)
You have been scheduled for an endoscopy and colonoscopy with propofol. Please follow the written instructions given to you at your visit today.   We have given you samples of Align. This puts good bacteria back into your colon. You should take 1 capsule by mouth once daily. If this works well for you, it can be purchased over the counter.

## 2011-10-22 ENCOUNTER — Encounter: Payer: Self-pay | Admitting: Internal Medicine

## 2011-10-22 NOTE — Progress Notes (Signed)
HISTORY OF PRESENT ILLNESS:  [name] is a 46 y.o. female with the below listed medical history. She presents today with multiple GI complaints. She has had these complaints for some time. She has been seen by her primary provider as well as gastroenterology extenders on 2 occasions recently. She comes in today, principally, to discuss her upcoming procedures. She is a number of issues and concerns. As well, she wants to reiterate her multiple GI complaints. She is on no formal GI medications. She does complain of acid reflux, heartburn, belching, bloating. As well some minor dysphagia times. Also some nausea. She has not been on PPI recently. Lower GI complaints include change in bowel habits with a tendency toward constipation, as well as intermittent rectal bleeding. Occasional anal discomfort. She has undergone colonoscopy and upper endoscopy remotely. Specifically, 1996 complete colonoscopy revealed mild diverticulosis. Upper endoscopy revealed Candida esophagitis. She has a number of concerns regarding her procedure including technique and type of IV use, issues with the colon prep, and sedation related concerns.  REVIEW OF SYSTEMS:  All non-GI ROS negative except for sinus and allergy trouble, anxiety, cough, fatigue, menstrual pain, muscle cramps, headaches, insomnia, increased thirst, increased urination, increased urinary frequency, leg swelling when working out  Past Medical History  Diagnosis Date  . Mitral valve disorders   . Contact dermatitis and other eczema, due to unspecified cause   . Esophageal reflux   . Asthma   . Lumbago   . IBS (irritable bowel syndrome)   . Diverticulosis   . Hemorrhoid   . Anxiety   . Anemia   . IC (interstitial cystitis)     Past Surgical History  Procedure Date  . Cesarean section   . Sinus surgery with instatrak   . Mouth surgery     Social History [name]  reports that she has never smoked. She has never used smokeless  tobacco. She reports that she does not drink alcohol or use illicit drugs.  family history includes Alcohol abuse in an unspecified family member; Colon polyps in her sister; Diabetes in her mother; Hyperlipidemia in her father; and Hypertension in her father and mother.  There is no history of Colon cancer.  Allergies  Allergen Reactions  . Latex Shortness Of Breath and Itching  . Other Shortness Of Breath    peanuts  . Sulphadimidine Sodium (Sulfamethazine Sodium) Shortness Of Breath    Added to food for preservative  . Amoxicillin   . Asa Buff (Mag (Buffered Aspirin) Other (See Comments)    burning sensation  . Carisoprodol   . Ciprofloxacin     REACTION: SOB, "Tightness in head"  . Clarithromycin   . Codeine   . Epinephrine     REACTION: MVP  . Penicillins   . Sulfamethoxazole W-Trimethoprim   . Sulfonamide Derivatives     REACTION: itching, SOB       PHYSICAL EXAMINATION: Vital signs: BP 86/64  Pulse 76  Ht 5' (1.524 m)  Wt 103 lb 3.2 oz (46.811 kg)  BMI 20.15 kg/m2  LMP 09/29/2011 General: Well-developed, well-nourished, no acute distress HEENT: Sclerae are anicteric, conjunctiva pink. Oral mucosa intact Lungs: Clear Heart: Regular Abdomen: soft, nontender, nondistended, no obvious ascites, no peritoneal signs, normal bowel sounds. No organomegaly. Extremities: No edema Psychiatric: alert and oriented x3. Cooperative . Anxious   ASSESSMENT:  #1. GERD. Typical and atypical symptoms #2. Minor dysphagia #3. Dyspepsia with increased gas. She requests probiotic samples #4. Intermittent rectal bleeding with anal discomfort #5.  Constipation #6. Health-related anxiety.   PLAN:  #1. Reflux precautions #2. Trial of OTC PPI #3. Upper endoscopy to evaluate GERD, dysphagia, and dyspepsia.The nature of the procedure, as well as the risks, benefits, and alternatives were carefully and thoroughly reviewed with the patient. Ample time for discussion and questions  allowed. The patient understood, was satisfied, and agreed to proceed.  #4. Colonoscopy to evaluate change in bowel habits and rectal bleeding.The nature of the procedure, as well as the risks, benefits, and alternatives were carefully and thoroughly reviewed with the patient. Ample time for discussion and questions allowed. The patient understood, was satisfied, and agreed to proceed.  #5. Movi prep prescribed. The patient instructed on its use. #6.samples of probiotic Align provided to her request

## 2011-11-11 ENCOUNTER — Ambulatory Visit (AMBULATORY_SURGERY_CENTER): Payer: BC Managed Care – PPO | Admitting: Internal Medicine

## 2011-11-11 ENCOUNTER — Encounter: Payer: BC Managed Care – PPO | Admitting: Internal Medicine

## 2011-11-11 ENCOUNTER — Other Ambulatory Visit: Payer: BLUE CROSS/BLUE SHIELD

## 2011-11-11 ENCOUNTER — Encounter: Payer: Self-pay | Admitting: Internal Medicine

## 2011-11-11 VITALS — BP 132/58 | HR 75 | Temp 98.5°F | Resp 12 | Ht 60.0 in | Wt 103.0 lb

## 2011-11-11 DIAGNOSIS — R1310 Dysphagia, unspecified: Secondary | ICD-10-CM

## 2011-11-11 DIAGNOSIS — R141 Gas pain: Secondary | ICD-10-CM

## 2011-11-11 DIAGNOSIS — K625 Hemorrhage of anus and rectum: Secondary | ICD-10-CM

## 2011-11-11 DIAGNOSIS — R142 Eructation: Secondary | ICD-10-CM

## 2011-11-11 DIAGNOSIS — R1013 Epigastric pain: Secondary | ICD-10-CM

## 2011-11-11 DIAGNOSIS — K219 Gastro-esophageal reflux disease without esophagitis: Secondary | ICD-10-CM

## 2011-11-11 MED ORDER — SODIUM CHLORIDE 0.9 % IV SOLN: 500.0000 mL | INTRAVENOUS | Status: DC

## 2011-11-11 NOTE — Progress Notes (Signed)
Propofol per k rogers crna. meds titrated throughout the procedure by crna. See scanned intra procedure report. ewm

## 2011-11-11 NOTE — Op Note (Signed)
[facility]. [place], [place]  [address]  COLONOSCOPY PROCEDURE REPORT  PATIENT:  [name], [name]  MR#:  [id number] BIRTHDATE:  1965/07/24, 45 yrs. old  GENDER:  female ENDOSCOPIST:  Docia Chuck. Geri Seminole, MD REF. BY:  Office PROCEDURE DATE:  11/11/2011 PROCEDURE:  Diagnostic Colonoscopy ASA CLASS:  Class II INDICATIONS:  rectal bleeding, constipation MEDICATIONS:   MAC sedation, administered by CRNA, propofol (Diprivan) 300 mg IV  DESCRIPTION OF PROCEDURE:   After the risks benefits and alternatives of the procedure were thoroughly explained, informed consent was obtained.  Digital rectal exam was performed and revealed no abnormalities.   The LB CF-H180AL [id number] endoscope was introduced through the anus and advanced to the cecum, which was identified by both the appendix and ileocecal valve, without limitations.  The quality of the prep was excellent, using MoviPrep.  The instrument was then slowly withdrawn as the colon was fully examined. <<PROCEDUREIMAGES>>  FINDINGS:  The terminal ileum appeared normal.  A normal appearing cecum, ileocecal valve, and appendiceal orifice were identified. The ascending, hepatic flexure, transverse, splenic flexure, descending, sigmoid colon, and rectum appeared unremarkable.  No polyps or cancers were seen.   Retroflexed views in the rectum revealed internal hemorrhoids.    The time to cecum = 2:38 minutes. The scope was then withdrawn in  10:47  minutes from the cecum and the procedure completed.  COMPLICATIONS:  None  ENDOSCOPIC IMPRESSION: 1) Normal terminal ileum 2) Normal colon 3) No polyps or cancers 4) Small Internal hemorrhoids  RECOMMENDATIONS: 1) Continue current colorectal screening recommendations for "routine risk" patients with a repeat colonoscopy in 10 years.  ______________________________ Docia Chuck. Geri Seminole, MD  CC:  Ricard Dillon, MD;  The Patient  n. eSIGNED:   Docia Chuck. Geri Seminole at  11/11/2011 03:00 PM  [name], [id number]

## 2011-11-11 NOTE — Op Note (Signed)
[facility]. [place], [place]  [address]  ENDOSCOPY PROCEDURE REPORT  PATIENT:  Mallory, Garcia  MR#:  [id number] BIRTHDATE:  1965/07/25, 45 yrs. old  GENDER:  female  ENDOSCOPIST:  Docia Chuck. Geri Seminole, MD Referred by:  Office  PROCEDURE DATE:  11/11/2011 PROCEDURE:  EGD, diagnostic 43235 ASA CLASS:  Class II INDICATIONS:  GERD, dyspepsia, dysphagia  MEDICATIONS:   MAC sedation, administered by CRNA, propofol (Diprivan) 100 mg IV TOPICAL ANESTHETIC:  none  DESCRIPTION OF PROCEDURE:   After the risks benefits and alternatives of the procedure were thoroughly explained, informed consent was obtained.  The LB GIF-H180 [id number] endoscope was introduced through the mouth and advanced to the second portion of the duodenum, without limitations.  The instrument was slowly withdrawn as the mucosa was fully examined. <<PROCEDUREIMAGES>>  The upper, middle, and distal third of the esophagus were carefully inspected and no abnormalities were noted. The z-line was well seen at the GEJ. The endoscope was pushed into the fundus which was normal including a retroflexed view. The antrum,gastric body, first and second part of the duodenum were unremarkable. Retroflexed views revealed no abnormalities.    The scope was then withdrawn from the patient and the procedure completed.  COMPLICATIONS:  None  ENDOSCOPIC IMPRESSION: 1) Normal EGD  RECOMMENDATIONS: 1) Anti-reflux regimen to be followed  ______________________________ Docia Chuck. Geri Seminole, MD  CC:  Ricard Dillon, MD;  The Patient  n. eSIGNED:   Docia Chuck. Geri Seminole at 11/11/2011 03:10 PM  [name], [id number]

## 2011-11-11 NOTE — Patient Instructions (Addendum)
YOU HAD AN ENDOSCOPIC PROCEDURE TODAY AT THE [facility]: Refer to the procedure report that was given to you for any specific questions about what was found during the examination.  If the procedure report does not answer your questions, please call your gastroenterologist to clarify.  If you requested that your care partner not be given the details of your procedure findings, then the procedure report has been included in a sealed envelope for you to review at your convenience later.  YOU SHOULD EXPECT: Some feelings of bloating in the abdomen. Passage of more gas than usual.  Walking can help get rid of the air that was put into your GI tract during the procedure and reduce the bloating. If you had a lower endoscopy (such as a colonoscopy or flexible sigmoidoscopy) you may notice spotting of blood in your stool or on the toilet paper. If you underwent a bowel prep for your procedure, then you may not have a normal bowel movement for a few days.  DIET: Your first meal following the procedure should be a light meal and then it is ok to progress to your normal diet.  A half-sandwich or bowl of soup is an example of a good first meal.  Heavy or fried foods are harder to digest and may make you feel nauseous or bloated.  Likewise meals heavy in dairy and vegetables can cause extra gas to form and this can also increase the bloating.  Drink plenty of fluids but you should avoid alcoholic beverages for 24 hours.  ACTIVITY: Your care partner should take you home directly after the procedure.  You should plan to take it easy, moving slowly for the rest of the day.  You can resume normal activity the day after the procedure however you should NOT DRIVE or use heavy machinery for 24 hours (because of the sedation medicines used during the test).    SYMPTOMS TO REPORT IMMEDIATELY: A gastroenterologist can be reached at any hour.  During normal business hours, 8:30 AM to 5:00 PM Monday through Friday,  call [phone].  After hours and on weekends, please call the GI answering service at [phone] who will take a message and have the physician on call contact you.   Following lower endoscopy (colonoscopy or flexible sigmoidoscopy):  Excessive amounts of blood in the stool  Significant tenderness or worsening of abdominal pains  Swelling of the abdomen that is new, acute  Fever of 100F or higher  Following upper endoscopy (EGD)  Vomiting of blood or coffee ground material  New chest pain or pain under the shoulder blades  Painful or persistently difficult swallowing  New shortness of breath  Fever of 100F or higher  Black, tarry-looking stools  FOLLOW UP: If any biopsies were taken you will be contacted by phone or by letter within the next 1-3 weeks.  Call your gastroenterologist if you have not heard about the biopsies in 3 weeks.  Our staff will call the home number listed on your records the next business day following your procedure to check on you and address any questions or concerns that you may have at that time regarding the information given to you following your procedure. This is a courtesy call and so if there is no answer at the home number and we have not heard from you through the emergency physician on call, we will assume that you have returned to your regular daily activities without incident.  SIGNATURES/CONFIDENTIALITY: You and/or your care   partner have signed paperwork which will be entered into your electronic medical record.  These signatures attest to the fact that that the information above on your After Visit Summary has been reviewed and is understood.  Full responsibility of the confidentiality of this discharge information lies with you and/or your care-partner.   Ok to resume your normal medications  Follow anti-reflux regimen- see handout

## 2011-11-11 NOTE — Progress Notes (Signed)
Patient did not have preoperative order for IV antibiotic SSI prophylaxis. (G8918)  Patient did not experience any of the following events: a burn prior to discharge; a fall within the facility; wrong site/side/patient/procedure/implant event; or a hospital transfer or hospital admission upon discharge from the facility. (G8907)  

## 2011-11-12 ENCOUNTER — Telehealth: Payer: Self-pay

## 2011-11-12 NOTE — Telephone Encounter (Signed)
Left message

## 2011-11-12 NOTE — Telephone Encounter (Signed)
Pt stated there is a dime sized purple discoloration at IV insertion site.  Advised to apply warm compress.  Also stated she has umbilicus "soreness".  Advised pt this should subside but if it does not call us again tomorrow.  Also stated her "tailbone was sore".  RN unable to identify cause of discomfort.  RN advised pt If any symptoms worsen or new symptoms arise call again.

## 2011-11-18 ENCOUNTER — Encounter: Payer: BLUE CROSS/BLUE SHIELD | Admitting: Internal Medicine

## 2011-11-25 ENCOUNTER — Encounter: Payer: BC Managed Care – PPO | Admitting: Internal Medicine

## 2011-12-09 ENCOUNTER — Other Ambulatory Visit: Payer: Self-pay | Admitting: Obstetrics and Gynecology

## 2011-12-09 DIAGNOSIS — Z1231 Encounter for screening mammogram for malignant neoplasm of breast: Secondary | ICD-10-CM

## 2012-01-14 ENCOUNTER — Ambulatory Visit (INDEPENDENT_AMBULATORY_CARE_PROVIDER_SITE_OTHER): Payer: BC Managed Care – PPO | Admitting: Internal Medicine

## 2012-01-14 DIAGNOSIS — Z23 Encounter for immunization: Secondary | ICD-10-CM

## 2012-01-20 ENCOUNTER — Ambulatory Visit
Admission: RE | Admit: 2012-01-20 | Discharge: 2012-01-20 | Disposition: A | Discharge status: Home / Self Care | Payer: BC Managed Care – PPO | Source: Ambulatory Visit | Attending: Obstetrics and Gynecology | Admitting: Obstetrics and Gynecology

## 2012-01-20 DIAGNOSIS — Z1231 Encounter for screening mammogram for malignant neoplasm of breast: Secondary | ICD-10-CM

## 2012-01-27 ENCOUNTER — Other Ambulatory Visit: Payer: BC Managed Care – PPO

## 2012-02-03 ENCOUNTER — Encounter: Payer: Self-pay | Admitting: Internal Medicine

## 2012-02-05 ENCOUNTER — Encounter: Payer: Self-pay | Admitting: Family Medicine

## 2012-02-05 ENCOUNTER — Ambulatory Visit (HOSPITAL_COMMUNITY)
Admission: RE | Admit: 2012-02-05 | Discharge: 2012-02-05 | Disposition: A | Discharge status: Home / Self Care | Payer: BC Managed Care – PPO | Source: Ambulatory Visit | Attending: Family Medicine | Admitting: Family Medicine

## 2012-02-05 ENCOUNTER — Ambulatory Visit (INDEPENDENT_AMBULATORY_CARE_PROVIDER_SITE_OTHER): Payer: BC Managed Care – PPO | Admitting: Family Medicine

## 2012-02-05 ENCOUNTER — Ambulatory Visit: Payer: BC Managed Care – PPO | Admitting: Internal Medicine

## 2012-02-05 VITALS — BP 106/60 | HR 106 | Temp 98.5°F | Wt 108.0 lb

## 2012-02-05 DIAGNOSIS — R1031 Right lower quadrant pain: Secondary | ICD-10-CM

## 2012-02-05 DIAGNOSIS — K5732 Diverticulitis of large intestine without perforation or abscess without bleeding: Secondary | ICD-10-CM | POA: Insufficient documentation

## 2012-02-05 LAB — POCT URINALYSIS DIPSTICK
Bilirubin, UA: NEGATIVE
Glucose, UA: NEGATIVE
Leukocytes, UA: NEGATIVE
Nitrite, UA: NEGATIVE
Urobilinogen, UA: 0.2

## 2012-02-05 LAB — PREGNANCY, URINE: Preg Test, Ur: NEGATIVE

## 2012-02-05 MED ORDER — IOHEXOL 300 MG/ML  SOLN
80.0000 mL | Freq: Once | INTRAMUSCULAR | Status: AC | PRN
  Administered 2012-02-05: 80 mL via INTRAVENOUS

## 2012-02-05 MED ORDER — NITROFURANTOIN MONOHYD MACRO 100 MG PO CAPS: 100.0000 mg | ORAL_CAPSULE | Freq: Two times a day (BID) | ORAL | Status: DC

## 2012-02-05 NOTE — Addendum Note (Signed)
Addended by: Aggie Hacker A on: 02/05/2012 05:11 PM   Modules accepted: Orders

## 2012-02-05 NOTE — Progress Notes (Signed)
  Subjective:    Patient ID: [name], female    DOB: Dec 14, 1965, 46 y.o.   MRN: [id number]  HPI Here for the onset last night of intermittent RLQ pains which wax and wane. They are never severe. They are sharp and can radiate to the suprapubic area. No back pain. She is nauseated but has not vomited. She has chills but no fever. Her appetite is down. Her BMs are regular. She has urgency to urinate but no burning.   Review of Systems  Constitutional: Positive for chills and appetite change. Negative for fever and diaphoresis.  Respiratory: Negative.   Cardiovascular: Negative.   Gastrointestinal: Positive for abdominal pain. Negative for nausea, vomiting, diarrhea, constipation, blood in stool, abdominal distention and rectal pain.  Genitourinary: Positive for urgency and frequency. Negative for dysuria, hematuria, flank pain and difficulty urinating.       Objective:   Physical Exam  Constitutional: She appears well-developed and well-nourished. No distress.  Abdominal: Soft. Bowel sounds are normal. She exhibits no distension and no mass. There is no rebound and no guarding.       Moderately tender in the RLQ           Assessment & Plan:  RLQ pain, possible appendicitis. More likely a UTI or a ureteral stone. Send for a contrasted CT of pelvis and abdomen tonight.

## 2012-02-08 ENCOUNTER — Telehealth: Payer: Self-pay | Admitting: Internal Medicine

## 2012-02-08 MED ORDER — METRONIDAZOLE 500 MG PO TABS: 500.0000 mg | ORAL_TABLET | Freq: Three times a day (TID) | ORAL | Status: DC

## 2012-02-08 MED ORDER — DOXYCYCLINE HYCLATE 100 MG PO TABS: 100.0000 mg | ORAL_TABLET | Freq: Two times a day (BID) | ORAL | Status: DC

## 2012-02-08 NOTE — Progress Notes (Signed)
Quick Note:  I left voice message on home & cell phone for pt to return my call. I need to go over results and send in scripts. ______

## 2012-02-08 NOTE — Telephone Encounter (Signed)
Looks like you had Left message on machine For her to call y ou back

## 2012-02-08 NOTE — Telephone Encounter (Signed)
I sent in 2 scripts e-scribe that was ordered from pt's scan and I spoke with pt, see imaging results.

## 2012-02-08 NOTE — Telephone Encounter (Signed)
Pt called and said that she had a CT scan done at hosp and pt was dx with Diverticulosis. Pt is wandering if there is a med that she can take for this? What preventative measures can pt take? Also pt was prescribed an abx for uti by Dr Sarajane Jews, and it is causing some diarrhea. Pt is only taking 1 pill in evening. Pls call.

## 2012-02-09 ENCOUNTER — Telehealth: Payer: Self-pay | Admitting: Internal Medicine

## 2012-02-09 NOTE — Telephone Encounter (Signed)
Pt was having abdominal pain and bloating several days ago. Pt was seen by Dr. Sarajane Jews, CT was done on 02/05/12. Pt has a UTI and mild to moderate sigmoid diverticulitis. She was placed on Doxycycline 100mg  BID for a UTI and Flagyl 500mg  TID for diverticulitis. Pt is calling wanting to know if Dr. Henrene Pastor thinks she is on the correct meds. States she is feeling dizzy and can't concentrate with the Flagyl. Pt states she has problems taking antibiotics. Pt states she cannot take Cipro. Pt would like to know if there is something she can take-1 pill- that will cover both problems. Let pt I would check with Dr. Henrene Pastor but I did not believe there was one pill that would cover everything. Dr. Henrene Pastor please advise.

## 2012-02-09 NOTE — Telephone Encounter (Signed)
Left message for pt to call back  °

## 2012-02-09 NOTE — Progress Notes (Signed)
Quick Note:  I spoke with pt, she is dizzy from the Flagyl and per Dr. Sarajane Jews , she can stop this but continue with the Doxy. Pt did agree to this. ______

## 2012-02-10 ENCOUNTER — Ambulatory Visit (INDEPENDENT_AMBULATORY_CARE_PROVIDER_SITE_OTHER): Payer: BC Managed Care – PPO | Admitting: Physician Assistant

## 2012-02-10 ENCOUNTER — Encounter: Payer: Self-pay | Admitting: *Deleted

## 2012-02-10 VITALS — BP 100/66 | HR 60 | Ht 59.0 in | Wt 106.6 lb

## 2012-02-10 DIAGNOSIS — K5732 Diverticulitis of large intestine without perforation or abscess without bleeding: Secondary | ICD-10-CM

## 2012-02-10 DIAGNOSIS — K5792 Diverticulitis of intestine, part unspecified, without perforation or abscess without bleeding: Secondary | ICD-10-CM

## 2012-02-10 MED ORDER — DOXYCYCLINE HYCLATE 100 MG PO TABS: 100.0000 mg | ORAL_TABLET | Freq: Two times a day (BID) | ORAL | Status: DC

## 2012-02-10 MED ORDER — ALIGN PO CAPS: 1.0000 | ORAL_CAPSULE | Freq: Every day | ORAL | Status: DC

## 2012-02-10 NOTE — Telephone Encounter (Signed)
I reviewed her CT scan with the radiologist. Have her continue on the doxycycline and decrease Flagyl to twice daily. My schedule is full, but have her seen by an extender this week. They will staff the case with me. We can decide if adjustments need to be made at that time.

## 2012-02-10 NOTE — Telephone Encounter (Signed)
Pt states she is taking the doxycycline but she talked to her PCP last night and he told her to stop the Flagyl. Pt scheduled to see Nicoletta Ba PA today at 2pm. Pt aware of appt date and time.

## 2012-02-10 NOTE — Patient Instructions (Addendum)
Please call back after completing antibiotics if you are not feeling better  Can follow up with Dr. Henrene Pastor as needed   We have given you samples of Align. This puts good bacteria back into your colon. You should take 1 capsule by mouth once daily for three weeks. If this works well for you, it can be purchased over the counter.  We have sent the following medications to your pharmacy for you to pick up at your convenience: Doxycyline. Please take one by mouth twice a day for fourteen days   Please take Prilosec OR Pepcid AC over the counter one by mouth once daily

## 2012-02-11 ENCOUNTER — Encounter: Payer: Self-pay | Admitting: Physician Assistant

## 2012-02-11 NOTE — Progress Notes (Signed)
Subjective:    Patient ID: [name], female    DOB: December 06, 1965, 46 y.o.   MRN: [id number]  HPI [name] is a 46 year old white female known to Dr. Scarlette Shorts. She  has history of diverticular disease and IBS, as well as GERD. She underwent colonoscopy in July of 2013 which was read as a normal exam however on prior colonoscopy had been noted to have mild diverticulosis. She also had endoscopy in July which was negative. He had onset about 6 days ago with our abdominal pain which she says was sharp and felt like labor pains in the middle of the night. She saw Dr. Sarajane Jews the following day initially was thought to have a mild urinary tract infection however with her increased pain she underwent CT scan of the abdomen and pelvis on 02/05/2012 and was noted to have sigmoid diverticulitis was no evidence of extraluminal gas or abscess she had trace free pelvic fluid. Apparently she had been given nitrofurantoin for her bladder infection and was unable to get in touch with Dr. Sarajane Jews over the weekend but was then started on doxycycline 100 mg twice daily and Flagyl twice daily, instead of thenitrofurantoin on Monday, October 14. He said at that point she was already feeling somewhat better and since her pain has for the most part subsided. Has not had any fever or chills, she has no current dysuria and her bowels are moving fairly normally. She does feel somewhat bloated. She has been able to eat. She says she felt careful after one dose of Flagyl, had malaise what she describes as slurred speech but did not take any further dose of Flagyl.    Review of Systems  Constitutional: Negative.   HENT: Negative.   Respiratory: Negative.   Cardiovascular: Negative.   Gastrointestinal: Positive for abdominal pain.  Genitourinary: Negative.   Musculoskeletal: Negative.   Neurological: Negative.   Hematological: Negative.   Psychiatric/Behavioral: Negative.    Outpatient Prescriptions Prior to Visit  Medication  Sig Dispense Refill  . doxycycline (VIBRA-TABS) 100 MG tablet Take 1 tablet (100 mg total) by mouth 2 (two) times daily.  20 tablet  0  . Lactobacillus (PROBIATA PO) Take by mouth daily.        . nitrofurantoin, macrocrystal-monohydrate, (MACROBID) 100 MG capsule Take 1 capsule (100 mg total) by mouth 2 (two) times daily.  14 capsule  0  . Multiple Vitamins-Minerals (MULTIVITAMIN WITH IRON-MINERALS) liquid Take 5 mLs by mouth daily.  236 mL  12  . metroNIDAZOLE (FLAGYL) 500 MG tablet Take 1 tablet (500 mg total) by mouth 3 (three) times daily.  30 tablet  0   Allergies  Allergen Reactions  . Latex Shortness Of Breath and Itching  . Other Shortness Of Breath    peanuts  . Sulphadimidine Sodium (Sulfamethazine Sodium) Shortness Of Breath    Added to food for preservative  . Amoxicillin   . Asa Buff (Mag (Buffered Aspirin) Other (See Comments)    burning sensation  . Carisoprodol   . Ciprofloxacin     REACTION: SOB, "Tightness in head"  . Clarithromycin   . Codeine   . Epinephrine     REACTION: MVP  . Penicillins   . Sulfamethoxazole W-Trimethoprim   . Sulfonamide Derivatives     REACTION: itching, SOB   Patient Active Problem List  Diagnosis  . MITRAL VALVE PROLAPSE  . Allergic rhinitis due to pollen  . Asthma, exogenous  . ESOPHAGEAL REFLUX  . DERMATITIS  . LOW BACK PAIN  .  ANEMIA  . IRRITABLE BOWEL SYNDROME  . FLATULENCE-GAS-BLOATING  . Fibroids  . Perimenopausal  . Diverticulosis   History   Social History  . Marital Status: Married    Spouse Name: N/A    Number of Children: N/A  . Years of Education: N/A   Occupational History  . [employer]     Social History Main Topics  . Smoking status: Never Smoker   . Smokeless tobacco: Never Used  . Alcohol Use: No  . Drug Use: No  . Sexually Active: Yes   Other Topics Concern  . Not on file   Social History Narrative  . No narrative on file       Objective:   Physical Exam a well-developed white female in no  acute distress blood pressure 100/66 pulse 60. HEENT; nontraumatic normocephalic EOMI PERRLA sclera anicteric, Neck; supple no JVD, Cardiovascular; regular rate and rhythm with S1-S2 no murmur or gallop, Pulmonary; clear bilaterally, Abdomen; soft, she is tender in the left lower quadrant no guarding or rebound no palpable mass or hepatosplenomegaly bowel sounds are active  Rectal ;exam not done, Extremities; no clubbing cyanosis or edema skin warm and dry, Psych; mood and affect normal and appropriate.        Assessment & Plan:  #79 46 year old female with first episode of sigmoid diverticulitis. She is intolerant to multiple antibiotics, and currently improving on doxycycline 100 mg by mouth twice daily. #2 IBS #3 GERD-patient is not on any maintenance medicine and would prefer not to take medication everyday but asks what to use periodically for heartburn  Plan; complete a 14 day course of doxycycline 100 mg by mouth twice daily, will call in another 4 days worth probiotic daily at least over the next few weeks, she was given samples of Align today Extra strength Tylenol as needed for pain Advised Pepcid a.c. or Prilosec 20 mg OTC as needed for heartburn Patient is asked to call if she has any residual abdominal discomfort when she finishes her antibiotics or if any point her pain worsens, otherwise she will followup with Dr. Henrene Pastor as needed

## 2012-02-11 NOTE — Progress Notes (Signed)
Patient known to me. Agree with assessment and plans

## 2012-02-24 ENCOUNTER — Ambulatory Visit (INDEPENDENT_AMBULATORY_CARE_PROVIDER_SITE_OTHER): Payer: BC Managed Care – PPO | Admitting: Internal Medicine

## 2012-02-24 ENCOUNTER — Encounter: Payer: Self-pay | Admitting: Internal Medicine

## 2012-02-24 VITALS — BP 110/58 | HR 100 | Ht 59.0 in | Wt 107.0 lb

## 2012-02-24 DIAGNOSIS — J45909 Unspecified asthma, uncomplicated: Secondary | ICD-10-CM

## 2012-02-24 DIAGNOSIS — J301 Allergic rhinitis due to pollen: Secondary | ICD-10-CM

## 2012-02-24 MED ORDER — MONTELUKAST SODIUM 4 MG PO CHEW: 4.0000 mg | CHEWABLE_TABLET | Freq: Every day | ORAL | Status: DC | PRN

## 2012-02-24 NOTE — Progress Notes (Signed)
Patient ID: [name], female    DOB: [birth year], 46 y.o.   MRN: [id number]  HPI 11/21/10 Hosp 1 year ago- dehydration, heat exhaustion, low iron level.  Thinks chronic singulair is bothering stomach. After long time use, then a hiatus, she took one and had diarrhea.  Does saline nasal rinse/ squeeze bottle every morning.  Feels tightness in chest off and on. Some dry cough and tight chest. Always very sensitive to stimulants, including Atrovent HFA, because of tachypalpitation she relates to mitral prolapse.   03/04/11-  58 yoF never smoker returning to this area after last seen 2009. Hx allergic rhinitis, asthma, anxiety. She has had flu vaccine. Has a new job doing Journalist, newspaper. Cold air is tightening in her chest some when she goes outdoors. Occasional dry, tight cough. We have prescribed a nebulizer machine but she turned back in because her insurance wouldn't cover it. She is taking Singulair 4 mg every other day and emphasizes her intolerance to many medications. Minor epistaxis, using saline gel. Still has some left over Tilade nebulizer solution but we discussed options. She volunteers that she is now recognizing the importance of anxiety with many of her respiratory symptoms. PFT 11/26/2010 within normal limits within significant response to bronchodilator. FEV1/FVC 0.79. Results were reviewed with her, including a lengthy discussion of the significance of the different subtests.  08/26/11 -35 yoF never smoker returning to this area after last seen 2009. Hx allergic rhinitis, asthma, anxiety. " breathing too well"; saw Dr Arnoldo Morale last week and breathing heavy. unsure if due to exercise or weather Joined a gym class for exercise. Using Qnasl which she understood to be for nose and chest. Has an old nebulizer machine. She credits Singulair with helping reduce her "inflammation". Little cough, wheeze, phlegm.  02/24/12- 19 yoF never smoker returning to this area after last seen 2009.  Hx allergic rhinitis, asthma, anxiety. Clear colored mucus; congestion since last visit 2 weeks ago diverticulitis was treated with doxycycline without surgery. Told by GI that she has GERD. Has had recent nasal congestion some nose bleeding and head "feels heavy". Minor cough with clear mucus. Qnasl caused malaise. Off of Singulair saying 5 mg dose was better for her stomach.  Review of Systems-see HPI Constitutional:   No-   weight loss, night sweats, fevers, chills, fatigue, lassitude. HEENT:   No-  headaches, difficulty swallowing, tooth/dental problems, sore throat,       No-  sneezing, itching, ear ache, +nasal congestion, post nasal drip,  CV:  No-   chest pain, orthopnea, PND, swelling in lower extremities, anasarca, dizziness, palpitations Resp: +shortness of breath with exertion or at rest.              No-   productive cough,  No non-productive cough,  No- coughing up of blood.              No-   change in color of mucus.  No recent  wheezing.   Skin: No-   rash or lesions. GI:  No-   heartburn, indigestion, abdominal pain, nausea, vomiting, GU:  MS:  No-   joint pain or swelling.   Neuro-     nothing unusual Psych:  No- change in mood or affect. No depression + anxiety.  No memory loss.  Objective:   Physical Exam General- Alert, Oriented, Affect-anxious/ talkative, Distress- none acute, trim, petite woman    Skin- rash-none, lesions- none, excoriation- none Lymphadenopathy- none Head- atraumatic  Eyes- Gross vision intact, PERRLA, conjunctivae clear secretions            Ears- Hearing, canals            Nose- +Narrow with mucus bridging, No- Septal dev, mucus, polyps, erosion, perforation             Throat- Mallampati II , mucosa clear , drainage- none, tonsils- atrophic Neck- flexible , trachea midline, no stridor , thyroid nl, carotid no bruit Chest - symmetrical excursion , unlabored           Heart/CV- RRR , no murmur , no gallop  , no rub, nl s1 s2                            - JVD- none , edema- none, stasis changes- none, varices- none           Lung- clear to P&A, wheeze- none, cough- none , dullness-none, rub- none           Chest wall-  Abd-  Br/ Gen/ Rectal- Not done, not indicated Extrem- cyanosis- none, clubbing, none, atrophy- none, strength- nl Neuro- grossly intact to observation

## 2012-02-24 NOTE — Patient Instructions (Addendum)
Finish the doxycycline  Ok to use the Qnasl nasal steroid spray fr intervals when needed      1-2 puffs each nostril once every night at bedtime  Singulair 4 mg , once daily, script sent

## 2012-03-02 ENCOUNTER — Other Ambulatory Visit (INDEPENDENT_AMBULATORY_CARE_PROVIDER_SITE_OTHER): Payer: BC Managed Care – PPO

## 2012-03-02 ENCOUNTER — Ambulatory Visit: Payer: BC Managed Care – PPO | Admitting: Internal Medicine

## 2012-03-02 DIAGNOSIS — D518 Other vitamin B12 deficiency anemias: Secondary | ICD-10-CM

## 2012-03-02 DIAGNOSIS — D519 Vitamin B12 deficiency anemia, unspecified: Secondary | ICD-10-CM

## 2012-03-02 DIAGNOSIS — Z0000 Encounter for general adult medical examination without abnormal findings: Secondary | ICD-10-CM

## 2012-03-02 DIAGNOSIS — Z13220 Encounter for screening for lipoid disorders: Secondary | ICD-10-CM

## 2012-03-02 LAB — POCT URINALYSIS DIPSTICK
Bilirubin, UA: NEGATIVE
Glucose, UA: NEGATIVE
Ketones, UA: NEGATIVE
Leukocytes, UA: NEGATIVE
Nitrite, UA: NEGATIVE

## 2012-03-02 LAB — HEPATIC FUNCTION PANEL
AST: 18 U/L (ref 0–37)
Albumin: 3.8 g/dL (ref 3.5–5.2)
Alkaline Phosphatase: 61 U/L (ref 39–117)
Bilirubin, Direct: 0 mg/dL (ref 0.0–0.3)
Total Bilirubin: 0.7 mg/dL (ref 0.3–1.2)

## 2012-03-02 LAB — CBC WITH DIFFERENTIAL/PLATELET
Basophils Absolute: 0 10*3/uL (ref 0.0–0.1)
Basophils Relative: 0.6 % (ref 0.0–3.0)
HCT: 38 % (ref 36.0–46.0)
Hemoglobin: 12.6 g/dL (ref 12.0–15.0)
Lymphs Abs: 1.7 10*3/uL (ref 0.7–4.0)
Monocytes Relative: 10.9 % (ref 3.0–12.0)
Neutro Abs: 1.2 10*3/uL — ABNORMAL LOW (ref 1.4–7.7)
RBC: 3.77 Mil/uL — ABNORMAL LOW (ref 3.87–5.11)
RDW: 12.9 % (ref 11.5–14.6)

## 2012-03-02 LAB — BASIC METABOLIC PANEL
BUN: 10 mg/dL (ref 6–23)
CO2: 28 mEq/L (ref 19–32)
Calcium: 8.8 mg/dL (ref 8.4–10.5)
Chloride: 104 mEq/L (ref 96–112)
Creatinine, Ser: 0.6 mg/dL (ref 0.4–1.2)
Glucose, Bld: 88 mg/dL (ref 70–99)

## 2012-03-02 LAB — LIPID PANEL
Total CHOL/HDL Ratio: 4
Triglycerides: 77 mg/dL (ref 0.0–149.0)

## 2012-03-02 LAB — LDL CHOLESTEROL, DIRECT: Direct LDL: 142.7 mg/dL

## 2012-03-02 MED ORDER — CYANOCOBALAMIN 1000 MCG/ML IJ SOLN
1000.0000 ug | INTRAMUSCULAR | Status: DC
  Administered 2012-03-02: 1000 ug via INTRAMUSCULAR

## 2012-03-06 ENCOUNTER — Encounter: Payer: Self-pay | Admitting: Internal Medicine

## 2012-03-06 NOTE — Assessment & Plan Note (Signed)
We discussed her use of nasal steroids and she was reassured. Plan-finished doxycycline. Retry Qnasl when needed. Singulair 4 mg

## 2012-03-06 NOTE — Assessment & Plan Note (Signed)
Minor cough but otherwise clear without intervention needed.

## 2012-03-09 ENCOUNTER — Encounter: Payer: Self-pay | Admitting: Internal Medicine

## 2012-04-12 ENCOUNTER — Telehealth: Payer: Self-pay | Admitting: Internal Medicine

## 2012-04-12 NOTE — Telephone Encounter (Signed)
Left message for pt to call back  °

## 2012-04-13 MED ORDER — DOXYCYCLINE HYCLATE 100 MG PO TABS: 100.0000 mg | ORAL_TABLET | Freq: Two times a day (BID) | ORAL | Status: DC

## 2012-04-13 NOTE — Telephone Encounter (Signed)
Pt aware and script sent to the pharmacy. 

## 2012-04-13 NOTE — Telephone Encounter (Signed)
She can take a 10 day course of doxycycline 100 mg twice daily again. Please tell her to call back if sxs worsen, or is she is not better when she finishes the abx- will need to be seen in office in either case.

## 2012-04-13 NOTE — Telephone Encounter (Signed)
Pt states she has started to have some cramping and "pulling" on the lower left side of her abdomen. Pt states she was told to call back if she started to have problems again. Pt is requesting something be called in. Pt saw Nicoletta Ba PA in October and was placed on Doxycycline. Amy please advise.

## 2012-04-25 ENCOUNTER — Telehealth: Payer: Self-pay | Admitting: Internal Medicine

## 2012-04-25 NOTE — Telephone Encounter (Signed)
Patient has significant improvement since finishing doxycycline, but she still has some LLQ discomfort.  I have scheduled a follow up for 05/04/12 with Dr. Henrene Pastor.  Amy Esterwood PA does she need additional treatment in the interim?

## 2012-04-26 MED ORDER — DOXYCYCLINE HYCLATE 100 MG PO TABS: 100.0000 mg | ORAL_TABLET | Freq: Two times a day (BID) | ORAL | Status: DC

## 2012-04-26 NOTE — Telephone Encounter (Signed)
I have left her a message as the office will close early for the holiday.  I have asked that if she has any questions to please call back today by 2 or on Thursday.

## 2012-04-26 NOTE — Telephone Encounter (Signed)
I think I  would give her another 7 days of Doxycycline  100 mg  Twice daily

## 2012-05-04 ENCOUNTER — Encounter: Payer: Self-pay | Admitting: Internal Medicine

## 2012-05-04 ENCOUNTER — Ambulatory Visit: Payer: BC Managed Care – PPO | Admitting: Internal Medicine

## 2012-05-04 ENCOUNTER — Ambulatory Visit (INDEPENDENT_AMBULATORY_CARE_PROVIDER_SITE_OTHER): Payer: 59 | Admitting: Internal Medicine

## 2012-05-04 VITALS — BP 90/66 | HR 72 | Ht 59.0 in | Wt 106.0 lb

## 2012-05-04 DIAGNOSIS — K219 Gastro-esophageal reflux disease without esophagitis: Secondary | ICD-10-CM

## 2012-05-04 DIAGNOSIS — R933 Abnormal findings on diagnostic imaging of other parts of digestive tract: Secondary | ICD-10-CM

## 2012-05-04 DIAGNOSIS — K589 Irritable bowel syndrome without diarrhea: Secondary | ICD-10-CM

## 2012-05-04 DIAGNOSIS — R1084 Generalized abdominal pain: Secondary | ICD-10-CM

## 2012-05-04 NOTE — Patient Instructions (Addendum)
We have given you samples of Align. This puts good bacteria back into your colon. You should take 1 capsule by mouth once daily. If this works well for you, it can be purchased over the counter.  Please follow up with Dr. Henrene Pastor as needed

## 2012-05-04 NOTE — Progress Notes (Signed)
HISTORY OF PRESENT ILLNESS:  [name] is a 47 y.o. female who has been evaluated previously for multiple GI complaints. She has a history of GERD, IBS, and multiple somatic complaints. In the fall, possibly had diverticulitis for which she was treated with doxycycline. Colonoscopy several months earlier was completely normal. Upper endoscopy at that time was also normal. She's contact the office on several occasions with vague abdominal complaints. Treated with a repeat course of doxycycline. Recently given doxycycline but she has not used. She has multiple other non-GI complaints today. She describes fleeting lower bowel discomfort and left upper quadrant, left lower quadrant, right midabdomen and right back. Tends to last for short periods of time. She thinks maybe exacerbated by her menstrual cycle. Some urinary-related discomfort. Bowel habits are okay. Has had weight gain. No bleeding. Multiple multiple questions. She is concerned about appendicitis and other conditions.  REVIEW OF SYSTEMS:  All non-GI ROS negative except for sinus and allergy trouble, anxiety, cough, fatigue, menstrual pain, fleeting neck rash, excessive thirst, increased urination  Past Medical History  Diagnosis Date  . Mitral valve disorders   . Contact dermatitis and other eczema, due to unspecified cause   . Esophageal reflux   . Asthma   . Lumbago   . IBS (irritable bowel syndrome)   . Anxiety   . Anemia   . IC (interstitial cystitis)   . Internal hemorrhoids   . Diverticulitis     CT Scan    Past Surgical History  Procedure Date  . Cesarean section   . Sinus surgery with instatrak   . Mouth surgery     Social History [name]  reports that she has never smoked. She has never used smokeless tobacco. She reports that she does not drink alcohol or use illicit drugs.  family history includes Alcohol abuse in an unspecified family member; Colon polyps in her sister; Diabetes in her mother;  Heart attack in her father; Hyperlipidemia in her father; and Hypertension in her father and mother.  There is no history of Colon cancer.  Allergies  Allergen Reactions  . Latex Shortness Of Breath and Itching  . Other Shortness Of Breath    peanuts  . Sulphadimidine Sodium (Sulfamethazine Sodium) Shortness Of Breath    Added to food for preservative  . Amoxicillin   . Asa Buff (Mag (Buffered Aspirin) Other (See Comments)    burning sensation  . Carisoprodol   . Ciprofloxacin     REACTION: SOB, "Tightness in head"  . Clarithromycin   . Codeine   . Epinephrine     REACTION: MVP  . Penicillins   . Sulfamethoxazole W-Trimethoprim   . Sulfonamide Derivatives     REACTION: itching, SOB       PHYSICAL EXAMINATION: Vital signs: BP 90/66  Pulse 72  Ht 4\' 11"  (1.499 m)  Wt 106 lb (48.081 kg)  BMI 21.41 kg/m2  LMP 04/15/2012 General: Well-developed, well-nourished, no acute distress HEENT: Sclerae are anicteric, conjunctiva pink. Oral mucosa intact Lungs: Clear Heart: Regular Abdomen: soft, nontender, nondistended, no obvious ascites, no peritoneal signs, normal bowel sounds. No organomegaly. Extremities: No edema Psychiatric: Anxious. alert and oriented x3. Cooperative    ASSESSMENT:  #1. Health related anxiety #2. Fleeting abdominal complaints, functional. #3. History of GERD. Normal EGD July 2013 #4. Possible diverticulitis (by CT), and no significant diverticulosis on recent colonoscopy, July 2013.  PLAN:  #1. Reassurance. Reassurance. Reassurance. Reassurance #2. High-fiber diet #3. OTC agents for GERD symptoms, as needed #4.  Resume general medical care with PCP

## 2012-05-09 ENCOUNTER — Encounter: Payer: Self-pay | Admitting: Internal Medicine

## 2012-05-09 ENCOUNTER — Ambulatory Visit (INDEPENDENT_AMBULATORY_CARE_PROVIDER_SITE_OTHER): Payer: 59 | Admitting: Internal Medicine

## 2012-05-09 VITALS — BP 110/70 | HR 72 | Temp 98.0°F | Resp 14 | Ht 59.0 in | Wt 104.0 lb

## 2012-05-09 DIAGNOSIS — Z0000 Encounter for general adult medical examination without abnormal findings: Secondary | ICD-10-CM

## 2012-05-09 MED ORDER — DEXLANSOPRAZOLE 60 MG PO CPDR: 60.0000 mg | DELAYED_RELEASE_CAPSULE | Freq: Every day | ORAL | Status: DC

## 2012-05-09 NOTE — Progress Notes (Signed)
Subjective:    Patient ID: [name], female    DOB: July 31, 1965, 47 y.o.   MRN: [id number]  HPI CPX   Review of Systems  Constitutional: Negative for activity change, appetite change and fatigue.  HENT: Negative for ear pain, congestion, neck pain, postnasal drip and sinus pressure.   Eyes: Negative for redness and visual disturbance.  Respiratory: Negative for cough, shortness of breath and wheezing.   Gastrointestinal: Negative for abdominal pain and abdominal distention.  Genitourinary: Negative for dysuria, frequency and menstrual problem.  Musculoskeletal: Negative for myalgias, joint swelling and arthralgias.  Skin: Negative for rash and wound.  Neurological: Negative for dizziness, weakness and headaches.  Hematological: Negative for adenopathy. Does not bruise/bleed easily.  Psychiatric/Behavioral: Negative for sleep disturbance and decreased concentration.   Past Medical History  Diagnosis Date  . Mitral valve disorders   . Contact dermatitis and other eczema, due to unspecified cause   . Esophageal reflux   . Asthma   . Lumbago   . IBS (irritable bowel syndrome)   . Anxiety   . Anemia   . IC (interstitial cystitis)   . Internal hemorrhoids   . Diverticulitis     CT Scan    History   Social History  . Marital Status: Married    Spouse Name: N/A    Number of Children: N/A  . Years of Education: N/A   Occupational History  . [employer]     Social History Main Topics  . Smoking status: Never Smoker   . Smokeless tobacco: Never Used  . Alcohol Use: No  . Drug Use: No  . Sexually Active: Yes   Other Topics Concern  . Not on file   Social History Narrative  . No narrative on file    Past Surgical History  Procedure Date  . Cesarean section   . Sinus surgery with instatrak   . Mouth surgery     Family History  Problem Relation Age of Onset  . Colon polyps Sister   . Diabetes Mother   . Hypertension Mother   . Hypertension Father   .  Hyperlipidemia Father   . Alcohol abuse      Family history  . Colon cancer Neg Hx   . Heart attack Father     Allergies  Allergen Reactions  . Latex Shortness Of Breath and Itching  . Other Shortness Of Breath    peanuts  . Sulphadimidine Sodium (Sulfamethazine Sodium) Shortness Of Breath    Added to food for preservative  . Amoxicillin   . Asa Buff (Mag (Buffered Aspirin) Other (See Comments)    burning sensation  . Carisoprodol   . Ciprofloxacin     REACTION: SOB, "Tightness in head"  . Clarithromycin   . Codeine   . Epinephrine     REACTION: MVP  . Penicillins   . Sulfamethoxazole W-Trimethoprim   . Sulfonamide Derivatives     REACTION: itching, SOB    Current Outpatient Prescriptions on File Prior to Visit  Medication Sig Dispense Refill  . bifidobacterium infantis (ALIGN) capsule Take 1 capsule by mouth daily.  21 capsule  0  . doxycycline (VIBRA-TABS) 100 MG tablet Take 1 tablet (100 mg total) by mouth 2 (two) times daily.  14 tablet  0  . Multiple Vitamin (MULTIVITAMIN) capsule Take 1 capsule by mouth daily.        BP 110/70  Pulse 72  Temp 98 F (36.7 C)  Resp 14  Ht 4\' 11"  (1.499 m)  Wt 104 lb (47.174 kg)  BMI 21.01 kg/m2  LMP 04/15/2012       Objective:   Physical Exam  Nursing note and vitals reviewed. Constitutional: She is oriented to person, place, and time. She appears well-developed and well-nourished. No distress.  HENT:  Head: Normocephalic and atraumatic.  Right Ear: External ear normal.  Left Ear: External ear normal.  Nose: Nose normal.  Mouth/Throat: Oropharynx is clear and moist.  Eyes: Conjunctivae normal and EOM are normal. Pupils are equal, round, and reactive to light.  Neck: Normal range of motion. Neck supple. No JVD present. No tracheal deviation present. No thyromegaly present.  Cardiovascular: Normal rate, regular rhythm, normal heart sounds and intact distal pulses.   No murmur heard. Pulmonary/Chest: Effort normal and  breath sounds normal. She has no wheezes. She exhibits no tenderness.  Abdominal: Soft. Bowel sounds are normal.  Musculoskeletal: Normal range of motion. She exhibits no edema and no tenderness.  Lymphadenopathy:    She has no cervical adenopathy.  Neurological: She is alert and oriented to person, place, and time. She has normal reflexes. No cranial nerve deficit.  Skin: Skin is warm and dry. She is not diaphoretic.  Psychiatric: She has a normal mood and affect. Her behavior is normal.          Assessment & Plan:   This is a routine physical examination for this healthy  Female. Reviewed all health maintenance protocols including mammography colonoscopy bone density and reviewed appropriate screening labs. Her immunization history was reviewed as well as her current medications and allergies refills of her chronic medications were given and the plan for yearly health maintenance was discussed all orders and referrals were made as appropriate.

## 2012-05-09 NOTE — Patient Instructions (Signed)
The patient is instructed to continue all medications as prescribed. Schedule followup with check out clerk upon leaving the clinic  

## 2012-05-17 ENCOUNTER — Ambulatory Visit: Payer: BC Managed Care – PPO | Admitting: Internal Medicine

## 2012-08-25 ENCOUNTER — Ambulatory Visit: Payer: BC Managed Care – PPO | Admitting: Internal Medicine

## 2012-08-25 ENCOUNTER — Ambulatory Visit (INDEPENDENT_AMBULATORY_CARE_PROVIDER_SITE_OTHER): Payer: 59 | Admitting: Internal Medicine

## 2012-08-25 ENCOUNTER — Encounter: Payer: Self-pay | Admitting: Internal Medicine

## 2012-08-25 VITALS — BP 112/60 | HR 88 | Ht 59.0 in | Wt 109.2 lb

## 2012-08-25 DIAGNOSIS — J301 Allergic rhinitis due to pollen: Secondary | ICD-10-CM

## 2012-08-25 DIAGNOSIS — J45909 Unspecified asthma, uncomplicated: Secondary | ICD-10-CM

## 2012-08-25 MED ORDER — AZELASTINE-FLUTICASONE 137-50 MCG/ACT NA SUSP: 1.0000 | NASAL | Status: DC

## 2012-08-25 NOTE — Patient Instructions (Addendum)
Sample Dymista nasal spray    1-2 puffs each nostril once daily at bedtime.   Try instead of Qnasl  Ok to restart Singulair 5 mg daily if needed  Ok to also take Alavert and/or Children's Sudafed if needed

## 2012-08-25 NOTE — Progress Notes (Signed)
Patient ID: [name], female    DOB: [birth year], 47 y.o.   MRN: [id number]  HPI 11/21/10 Hosp 1 year ago- dehydration, heat exhaustion, low iron level.  Thinks chronic singulair is bothering stomach. After long time use, then a hiatus, she took one and had diarrhea.  Does saline nasal rinse/ squeeze bottle every morning.  Feels tightness in chest off and on. Some dry cough and tight chest. Always very sensitive to stimulants, including Atrovent HFA, because of tachypalpitation she relates to mitral prolapse.   03/04/11-  31 yoF never smoker returning to this area after last seen 2009. Hx allergic rhinitis, asthma, anxiety. She has had flu vaccine. Has a new job doing Journalist, newspaper. Cold air is tightening in her chest some when she goes outdoors. Occasional dry, tight cough. We have prescribed a nebulizer machine but she turned back in because her insurance wouldn't cover it. She is taking Singulair 4 mg every other day and emphasizes her intolerance to many medications. Minor epistaxis, using saline gel. Still has some left over Tilade nebulizer solution but we discussed options. She volunteers that she is now recognizing the importance of anxiety with many of her respiratory symptoms. PFT 11/26/2010 within normal limits within significant response to bronchodilator. FEV1/FVC 0.79. Results were reviewed with her, including a lengthy discussion of the significance of the different subtests.  08/26/11 -62 yoF never smoker returning to this area after last seen 2009. Hx allergic rhinitis, asthma, anxiety. " breathing too well"; saw Dr Arnoldo Morale last week and breathing heavy. unsure if due to exercise or weather Joined a gym class for exercise. Using Qnasl which she understood to be for nose and chest. Has an old nebulizer machine. She credits Singulair with helping reduce her "inflammation". Little cough, wheeze, phlegm.  02/24/12- 58 yoF never smoker returning to this area after last seen 2009.  Hx allergic rhinitis, asthma, anxiety. Clear colored mucus; congestion since last visit 2 weeks ago diverticulitis was treated with doxycycline without surgery. Told by GI that she has GERD. Has had recent nasal congestion some nose bleeding and head "feels heavy". Minor cough with clear mucus. Qnasl caused malaise. Off of Singulair saying 5 mg dose was better for her stomach.  08/25/12- 39 yoF never smoker followed for allergic rhinitis, asthma. Hx  anxiety. FOLLOWS FOR: pt states that breathing is acting up-for about 1 week having sinus pressure; feels  like throat is full and bringing up colored phelgm. Mucinex caused nausea. Taking Alavert and Children's Sudafed which we discussed. Throat feels raspy with clear mucus.  Review of Systems-see HPI Constitutional:   No-   weight loss, night sweats, fevers, chills, fatigue, lassitude. HEENT:   No-  headaches, difficulty swallowing, tooth/dental problems, sore throat,       No-  sneezing, itching, ear ache, +nasal congestion, +post nasal drip,  CV:  No-   chest pain, orthopnea, PND, swelling in lower extremities, anasarca, dizziness, palpitations Resp: +shortness of breath with exertion or at rest.              No-   productive cough,  No non-productive cough,  No- coughing up of blood.              No-   change in color of mucus.  No recent  wheezing.   Skin: No-   rash or lesions. GI:  No-   heartburn, +indigestion, abdominal pain, nausea, vomiting, GU:  MS:  No-   joint pain or swelling.  Neuro-     nothing unusual Psych:  No- change in mood or affect. No depression + anxiety.  No memory loss.  Objective:   Physical Exam General- Alert, Oriented, Affect-anxious/ talkative, Distress- none acute, trim, petite woman    Skin- rash-none, lesions- none, excoriation- none Lymphadenopathy- none Head- atraumatic            Eyes- Gross vision intact, PERRLA, conjunctivae clear secretions            Ears- Hearing, canals            Nose-  +Narrow with no mucus, No- Septal dev, polyps, erosion, perforation             Throat- Mallampati II , mucosa clear , drainage- none, tonsils- atrophic Neck- flexible , trachea midline, no stridor , thyroid nl, carotid no bruit Chest - symmetrical excursion , unlabored           Heart/CV- RRR , no murmur , no gallop  , no rub, nl s1 s2                           - JVD- none , edema- none, stasis changes- none, varices- none           Lung- clear to P&A, wheeze- none, cough- none , dullness-none, rub- none           Chest wall-  Abd-  Br/ Gen/ Rectal- Not done, not indicated Extrem- cyanosis- none, clubbing, none, atrophy- none, strength- nl Neuro- grossly intact to observation

## 2012-09-04 NOTE — Assessment & Plan Note (Signed)
Currently control seems good. We discussed effect of pollen season.

## 2012-09-04 NOTE — Assessment & Plan Note (Signed)
Discussed use of low-dose decongestant and antihistamine as needed. Try sample Dymista

## 2012-09-09 ENCOUNTER — Ambulatory Visit (INDEPENDENT_AMBULATORY_CARE_PROVIDER_SITE_OTHER): Payer: 59 | Admitting: Internal Medicine

## 2012-09-09 ENCOUNTER — Encounter: Payer: Self-pay | Admitting: Internal Medicine

## 2012-09-09 VITALS — BP 110/70 | HR 72 | Temp 98.6°F | Resp 16 | Ht 59.0 in | Wt 108.0 lb

## 2012-09-09 DIAGNOSIS — J45909 Unspecified asthma, uncomplicated: Secondary | ICD-10-CM

## 2012-09-09 DIAGNOSIS — K219 Gastro-esophageal reflux disease without esophagitis: Secondary | ICD-10-CM

## 2012-09-09 DIAGNOSIS — L259 Unspecified contact dermatitis, unspecified cause: Secondary | ICD-10-CM

## 2012-09-09 DIAGNOSIS — J301 Allergic rhinitis due to pollen: Secondary | ICD-10-CM

## 2012-09-09 MED ORDER — METHYLPREDNISOLONE ACETATE 80 MG/ML IJ SUSP: 80.0000 mg | INTRAMUSCULAR | Status: AC

## 2012-09-09 NOTE — Progress Notes (Signed)
  Subjective:    Patient ID: [name], female    DOB: 04/24/1966, 47 y.o.   MRN: [id number]  HPI  GYN seeing to perimenopausal mood disorder and anxiety but has not started any SSRI? Flair of asthma with coughing and ear fullness Pressured speech and OCD behaivor  Review of Systems  Constitutional: Negative for activity change, appetite change and fatigue.  HENT: Negative for ear pain, congestion, neck pain, postnasal drip and sinus pressure.   Eyes: Negative for redness and visual disturbance.  Respiratory: Positive for cough. Negative for shortness of breath and wheezing.   Gastrointestinal: Negative for abdominal pain and abdominal distention.  Genitourinary: Positive for vaginal bleeding, vaginal discharge and vaginal pain. Negative for dysuria, frequency and menstrual problem.  Musculoskeletal: Negative for myalgias, joint swelling and arthralgias.  Skin: Negative for rash and wound.  Neurological: Negative for dizziness, weakness and headaches.  Hematological: Negative for adenopathy. Does not bruise/bleed easily.  Psychiatric/Behavioral: Negative for sleep disturbance and decreased concentration.       Objective:   Physical Exam  Nursing note reviewed. Constitutional: She is oriented to person, place, and time. She appears well-developed and well-nourished. No distress.  HENT:  Head: Normocephalic and atraumatic.  Eyes: Conjunctivae and EOM are normal. Pupils are equal, round, and reactive to light.  Neck: Normal range of motion. Neck supple. No JVD present. No tracheal deviation present. No thyromegaly present.  Cardiovascular: Normal rate and regular rhythm.   No murmur heard. Pulmonary/Chest: Effort normal and breath sounds normal. She has no wheezes. She exhibits no tenderness.  Abdominal: Soft. Bowel sounds are normal. She exhibits distension.  Musculoskeletal: Normal range of motion. She exhibits no edema and no tenderness.  Lymphadenopathy:    She has no  cervical adenopathy.  Neurological: She is alert and oriented to person, place, and time. She has normal reflexes. No cranial nerve deficit.  Skin: Skin is warm and dry. She is not diaphoretic.  Psychiatric: She has a normal mood and affect. Her behavior is normal.          Assessment & Plan:  Allergy flair Trial of qnasl and depo Monitor symptoms menstrual irregularity and possible perimenopausal symtoms D and C due tpo fibroids then consider HRT regulation Defer to GYN

## 2012-09-09 NOTE — Patient Instructions (Signed)
The patient is instructed to continue all medications as prescribed. Schedule followup with check out clerk upon leaving the clinic  

## 2012-09-09 NOTE — Addendum Note (Signed)
Addended by: Allyne Gee on: 09/09/2012 04:49 PM   Modules accepted: Orders

## 2012-09-09 NOTE — Addendum Note (Signed)
Addended by: Allyne Gee on: 09/09/2012 05:01 PM   Modules accepted: Medications

## 2012-09-16 ENCOUNTER — Telehealth: Payer: Self-pay | Admitting: Internal Medicine

## 2012-09-16 MED ORDER — CIPROFLOXACIN-HYDROCORTISONE 0.2-1 % OT SUSP: 3.0000 [drp] | Freq: Two times a day (BID) | OTIC | Status: DC

## 2012-09-16 NOTE — Telephone Encounter (Signed)
Pt states she has fluid in he left ear.  Md aware from visit the other day.  Pt states MD asked her to call in office if not any better. Pt would like ear drops to help w/ relief. Pharm:\ [facility] /  Animal nutritionist Dr

## 2012-09-16 NOTE — Telephone Encounter (Signed)
Per dr Arnoldo Morale- send in cipro otic drops -Left message on machine For pt

## 2012-10-03 ENCOUNTER — Telehealth: Payer: Self-pay | Admitting: Internal Medicine

## 2012-10-03 ENCOUNTER — Other Ambulatory Visit: Payer: Self-pay | Admitting: *Deleted

## 2012-10-03 DIAGNOSIS — H9203 Otalgia, bilateral: Secondary | ICD-10-CM

## 2012-10-03 NOTE — Telephone Encounter (Signed)
Name of her to an ENT at [facility] or [facility]

## 2012-10-03 NOTE — Telephone Encounter (Signed)
Pt requesting a referral to ENT specialist near [facility] due to continued discomfort in ear when moving.  Pt states she has used ear drops with minimal relief.

## 2012-10-03 NOTE — Telephone Encounter (Signed)
Don't think I have seen this pt. Forwarding to PCP.

## 2012-10-05 ENCOUNTER — Encounter (HOSPITAL_COMMUNITY): Payer: Self-pay | Admitting: *Deleted

## 2012-10-06 ENCOUNTER — Other Ambulatory Visit: Payer: Self-pay | Admitting: Obstetrics and Gynecology

## 2012-10-06 NOTE — Progress Notes (Signed)
Spoke to Dr. Seward Speck. Ok to see pt dos.

## 2012-10-12 ENCOUNTER — Encounter (HOSPITAL_COMMUNITY): Payer: Self-pay | Admitting: Pharmacist

## 2012-10-13 NOTE — H&P (Signed)
NAMEJULIANI, KENISON NO.:  [id number]  MEDICAL RECORD NO.:  [id number]  LOCATION:  [facility]                         FACILITY:  [facility]  PHYSICIAN:  Lovenia Kim, M.D.DATE OF BIRTH:  Jul 26, 1965  DATE OF ADMISSION:  09/27/2012 DATE OF DISCHARGE:                             HISTORY & PHYSICAL   CHIEF COMPLAINT:  Menorrhagia with secondary anemia.  HISTORY OF PRESENT ILLNESS:  A 47 year old white female, G2, P2, with a history of menorrhagia for definitive therapy.  ALLERGIES:  She has SULFA, PENICILLIN, ASPIRIN, __________, CIPRO and LATEX.  PAST MEDICAL HISTORY:  History of vaginal delivery x1, c-section x1.  No other medical or surgical hospitalizations.  FAMILY HISTORY:  Diabetes, heart disease, hypertension.  MEDICATIONS:  Include iron, p.r.n. inhaler, multivitamins, and hemorrhoidal ointment.  PHYSICAL EXAMINATION:  GENERAL:  She is a well-developed, well-nourished female, in no acute distress. HEENT:  Normal. NECK:  Supple.  Full range of motion. LUNGS:  Clear to auscultation. HEART:  Regular rhythm. ABDOMEN:  Soft, scaphoid, nontender. GU:  Pelvic exam reveals a bulky, anteflexed uterus and no adnexal masses.  Irregular, consistent with uterine fibroids. EXTREMITIES:  No cords. NEUROLOGIC:  Nonfocal. SKIN:  Intact.  IMPRESSION:  Symptomatic fibroids with menorrhagia secondary to anemia.  PLAN:  Proceed with diagnostic hysteroscopy, NovaSure endometrial ablation, D and C.  Risks of anesthesia, infection, bleeding, injury to surrounding organs, need for repair was discussed, delayed versus immediate complications include bowel and bladder injury noted. Inability to cure heavy bleeding were discussed.  The patient now wishes to proceed.     Lovenia Kim, M.D.     RJT/MEDQ  D:  10/13/2012  T:  10/13/2012  Job:  [id number]

## 2012-10-14 ENCOUNTER — Ambulatory Visit (HOSPITAL_COMMUNITY)
Admission: RE | Admit: 2012-10-14 | Discharge: 2012-10-14 | Disposition: A | Discharge status: Home / Self Care | Payer: 59 | Source: Ambulatory Visit | Attending: Obstetrics and Gynecology | Admitting: Obstetrics and Gynecology

## 2012-10-14 ENCOUNTER — Encounter (HOSPITAL_COMMUNITY): Payer: Self-pay | Admitting: Anesthesiology

## 2012-10-14 ENCOUNTER — Encounter (HOSPITAL_COMMUNITY)
Admission: RE | Disposition: A | Discharge status: Home / Self Care | Payer: Self-pay | Source: Ambulatory Visit | Attending: Obstetrics and Gynecology

## 2012-10-14 ENCOUNTER — Ambulatory Visit (HOSPITAL_COMMUNITY): Payer: 59 | Admitting: Anesthesiology

## 2012-10-14 DIAGNOSIS — D219 Benign neoplasm of connective and other soft tissue, unspecified: Secondary | ICD-10-CM

## 2012-10-14 DIAGNOSIS — N840 Polyp of corpus uteri: Secondary | ICD-10-CM | POA: Insufficient documentation

## 2012-10-14 DIAGNOSIS — N920 Excessive and frequent menstruation with regular cycle: Secondary | ICD-10-CM | POA: Insufficient documentation

## 2012-10-14 DIAGNOSIS — D500 Iron deficiency anemia secondary to blood loss (chronic): Secondary | ICD-10-CM | POA: Insufficient documentation

## 2012-10-14 HISTORY — PX: DILITATION & CURRETTAGE/HYSTROSCOPY WITH NOVASURE ABLATION: SHX5568

## 2012-10-14 LAB — CBC
MCH: 32.6 pg (ref 26.0–34.0)
MCHC: 34.9 g/dL (ref 30.0–36.0)
MCV: 93.5 fL (ref 78.0–100.0)
Platelets: 339 10*3/uL (ref 150–400)
RDW: 12.6 % (ref 11.5–15.5)

## 2012-10-14 LAB — HCG, SERUM, QUALITATIVE: Preg, Serum: NEGATIVE

## 2012-10-14 SURGERY — DILATATION & CURETTAGE/HYSTEROSCOPY WITH NOVASURE ABLATION
Anesthesia: General | Site: Uterus | Wound class: Clean Contaminated

## 2012-10-14 MED ORDER — PROPOFOL 10 MG/ML IV BOLUS
INTRAVENOUS | Status: DC | PRN
  Administered 2012-10-14: 30 mg via INTRAVENOUS
  Administered 2012-10-14: 100 mg via INTRAVENOUS

## 2012-10-14 MED ORDER — KETOROLAC TROMETHAMINE 30 MG/ML IJ SOLN
INTRAMUSCULAR | Status: AC
  Filled 2012-10-14: qty 1

## 2012-10-14 MED ORDER — FENTANYL CITRATE 0.05 MG/ML IJ SOLN
INTRAMUSCULAR | Status: AC
  Filled 2012-10-14: qty 2

## 2012-10-14 MED ORDER — MIDAZOLAM HCL 5 MG/5ML IJ SOLN
INTRAMUSCULAR | Status: DC | PRN
  Administered 2012-10-14: 1 mg via INTRAVENOUS

## 2012-10-14 MED ORDER — MEPERIDINE HCL 25 MG/ML IJ SOLN: 6.2500 mg | INTRAMUSCULAR | Status: DC | PRN

## 2012-10-14 MED ORDER — FENTANYL CITRATE 0.05 MG/ML IJ SOLN
INTRAMUSCULAR | Status: DC | PRN
  Administered 2012-10-14 (×2): 25 ug via INTRAVENOUS
  Administered 2012-10-14: 50 ug via INTRAVENOUS

## 2012-10-14 MED ORDER — MIDAZOLAM HCL 2 MG/2ML IJ SOLN
INTRAMUSCULAR | Status: AC
  Filled 2012-10-14: qty 2

## 2012-10-14 MED ORDER — LIDOCAINE HCL (CARDIAC) 20 MG/ML IV SOLN
INTRAVENOUS | Status: AC
  Filled 2012-10-14: qty 5

## 2012-10-14 MED ORDER — CEFAZOLIN SODIUM-DEXTROSE 2-3 GM-% IV SOLR
INTRAVENOUS | Status: AC
  Filled 2012-10-14: qty 50

## 2012-10-14 MED ORDER — VASOPRESSIN 20 UNIT/ML IJ SOLN
INTRAMUSCULAR | Status: AC
  Filled 2012-10-14: qty 1

## 2012-10-14 MED ORDER — BUPIVACAINE HCL (PF) 0.25 % IJ SOLN
INTRAMUSCULAR | Status: AC
  Filled 2012-10-14: qty 30

## 2012-10-14 MED ORDER — ONDANSETRON HCL 4 MG/2ML IJ SOLN
INTRAMUSCULAR | Status: AC
  Filled 2012-10-14: qty 2

## 2012-10-14 MED ORDER — FENTANYL CITRATE 0.05 MG/ML IJ SOLN: 25.0000 ug | INTRAMUSCULAR | Status: DC | PRN

## 2012-10-14 MED ORDER — LIDOCAINE HCL (CARDIAC) 20 MG/ML IV SOLN
INTRAVENOUS | Status: DC | PRN
  Administered 2012-10-14: 10 mg via INTRAVENOUS

## 2012-10-14 MED ORDER — CEFAZOLIN SODIUM-DEXTROSE 2-3 GM-% IV SOLR
2.0000 g | Freq: Once | INTRAVENOUS | Status: AC
  Administered 2012-10-14: 2 g via INTRAVENOUS

## 2012-10-14 MED ORDER — METOCLOPRAMIDE HCL 5 MG/ML IJ SOLN: 10.0000 mg | Freq: Once | INTRAMUSCULAR | Status: DC | PRN

## 2012-10-14 MED ORDER — KETOROLAC TROMETHAMINE 30 MG/ML IJ SOLN
INTRAMUSCULAR | Status: DC | PRN
  Administered 2012-10-14: 30 mg via INTRAVENOUS

## 2012-10-14 MED ORDER — OXYCODONE-ACETAMINOPHEN 5-325 MG PO TABS: 1.0000 | ORAL_TABLET | ORAL | Status: DC | PRN

## 2012-10-14 MED ORDER — ONDANSETRON HCL 4 MG/2ML IJ SOLN
INTRAMUSCULAR | Status: DC | PRN
  Administered 2012-10-14: 4 mg via INTRAVENOUS

## 2012-10-14 MED ORDER — PROPOFOL 10 MG/ML IV EMUL
INTRAVENOUS | Status: AC
  Filled 2012-10-14: qty 20

## 2012-10-14 MED ORDER — LACTATED RINGERS IR SOLN
Status: DC | PRN
  Administered 2012-10-14: 3000 mL

## 2012-10-14 MED ORDER — BUPIVACAINE HCL (PF) 0.25 % IJ SOLN
INTRAMUSCULAR | Status: DC | PRN
  Administered 2012-10-14: 30 mL

## 2012-10-14 MED ORDER — LACTATED RINGERS IV SOLN
INTRAVENOUS | Status: DC
  Administered 2012-10-14 (×2): via INTRAVENOUS

## 2012-10-14 SURGICAL SUPPLY — 13 items
ABLATOR ENDOMETRIAL BIPOLAR (ABLATOR) ×2 IMPLANT
CATH ROBINSON RED A/P 16FR (CATHETERS) ×2 IMPLANT
CLOTH BEACON ORANGE TIMEOUT ST (SAFETY) ×2 IMPLANT
CONTAINER PREFILL 10% NBF 60ML (FORM) ×4 IMPLANT
DRESSING TELFA 8X3 (GAUZE/BANDAGES/DRESSINGS) ×2 IMPLANT
GLOVE BIO SURGEON STRL SZ7.5 (GLOVE) ×2 IMPLANT
GOWN STRL REIN XL XLG (GOWN DISPOSABLE) ×4 IMPLANT
PACK HYSTEROSCOPY LF (CUSTOM PROCEDURE TRAY) ×2 IMPLANT
PAD OB MATERNITY 4.3X12.25 (PERSONAL CARE ITEMS) ×2 IMPLANT
PAD PREP 24X48 CUFFED NSTRL (MISCELLANEOUS) ×2 IMPLANT
SYR TB 1ML 25GX5/8 (SYRINGE) ×2 IMPLANT
TOWEL OR 17X24 6PK STRL BLUE (TOWEL DISPOSABLE) ×4 IMPLANT
WATER STERILE IRR 1000ML POUR (IV SOLUTION) ×2 IMPLANT

## 2012-10-14 NOTE — Progress Notes (Signed)
Patient ID: [name], female   DOB: 09-10-1965, 47 y.o.   MRN: [id number] Patient seen and examined. Consent witnessed and signed. No changes noted. Update completed.

## 2012-10-14 NOTE — Anesthesia Preprocedure Evaluation (Signed)
Anesthesia Evaluation  Patient identified by MRN, date of birth, ID band Patient awake    Reviewed: Allergy & Precautions, H&P , NPO status , Patient's Chart, lab work & pertinent test results  Airway Mallampati: II TM Distance: >3 FB Neck ROM: Full    Dental no notable dental hx. (+) Teeth Intact   Pulmonary asthma ,  breath sounds clear to auscultation  Pulmonary exam normal       Cardiovascular + Valvular Problems/Murmurs MVP Rhythm:Regular Rate:Normal     Neuro/Psych Anxiety negative neurological ROS     GI/Hepatic Neg liver ROS, GERD-  Medicated and Controlled,Diverticulosis IBS   Endo/Other  negative endocrine ROS  Renal/GU   negative genitourinary   Musculoskeletal   Abdominal   Peds  Hematology  (+) Blood dyscrasia, anemia ,   Anesthesia Other Findings   Reproductive/Obstetrics Menorrhagia                           Anesthesia Physical Anesthesia Plan  ASA: II  Anesthesia Plan: General   Post-op Pain Management:    Induction: Intravenous  Airway Management Planned: LMA  Additional Equipment:   Intra-op Plan:   Post-operative Plan: Extubation in OR  Informed Consent: I have reviewed the patients History and Physical, chart, labs and discussed the procedure including the risks, benefits and alternatives for the proposed anesthesia with the patient or authorized representative who has indicated his/her understanding and acceptance.   Dental advisory given  Plan Discussed with: CRNA and Surgeon  Anesthesia Plan Comments:         Anesthesia Quick Evaluation

## 2012-10-14 NOTE — Transfer of Care (Signed)
Immediate Anesthesia Transfer of Care Note  Patient: [name]  Procedure(s) Performed: Procedure(s): DILATATION & CURETTAGE/HYSTEROSCOPY WITH NOVASURE ABLATION (N/A)  Patient Location: PACU  Anesthesia Type:General  Level of Consciousness: awake, sedated and patient cooperative  Airway & Oxygen Therapy: Patient Spontanous Breathing and Patient connected to nasal cannula oxygen  Post-op Assessment: Report given to PACU RN and Post -op Vital signs reviewed and stable  Post vital signs: Reviewed and stable  Complications: No apparent anesthesia complications

## 2012-10-14 NOTE — Anesthesia Postprocedure Evaluation (Signed)
  Anesthesia Post-op Note  Patient: [name]  Procedure(s) Performed: Procedure(s): DILATATION & CURETTAGE/HYSTEROSCOPY WITH NOVASURE ABLATION (N/A)  Patient Location: PACU  Anesthesia Type:General  Level of Consciousness: awake, alert  and oriented  Airway and Oxygen Therapy: Patient Spontanous Breathing  Post-op Pain: none  Post-op Assessment: Post-op Vital signs reviewed, Patient's Cardiovascular Status Stable, Respiratory Function Stable, Patent Airway, No signs of Nausea or vomiting and Pain level controlled  Post-op Vital Signs: Reviewed and stable  Complications: No apparent anesthesia complications

## 2012-10-14 NOTE — Op Note (Signed)
10/14/2012  2:04 PM  PATIENT:  [name]  47 y.o. female  PRE-OPERATIVE DIAGNOSIS:  Menorrhagia, Anemia  Fibroids  POST-OPERATIVE DIAGNOSIS:   Menorrhagia with secondary anemia Uterine fibroids Endometrial polyps  PROCEDURE:  Procedure(s): DILATATION & CURETTAGE/HYSTEROSCOPY WITH NOVASURE ABLATION REMOVAL OF ENDOMETRIAL POLYP  SURGEON:  Surgeon(s): Lovenia Kim, MD  ASSISTANTS: none   ANESTHESIA:   local and general  ESTIMATED BLOOD LOSS: * No blood loss amount entered *   DRAINS: none   LOCAL MEDICATIONS USED:  MARCAINE     SPECIMEN:  Source of Specimen:  emc AND POLYP FRAGMENTS  DISPOSITION OF SPECIMEN:  PATHOLOGY  COUNTS:  YES  DICTATION #: [id number]  PLAN OF CARE: DC HOME  PATIENT DISPOSITION:  PACU - hemodynamically stable.

## 2012-10-14 NOTE — Op Note (Signed)
NAME:  [name], [name] NO.:  [id number]  MEDICAL RECORD NO.:  [id number]  LOCATION:  [facility]                          FACILITY:  [facility]  PHYSICIAN:  Lovenia Kim, M.D.DATE OF BIRTH:  [birth year]  DATE OF PROCEDURE: DATE OF DISCHARGE:  10/14/2012                              OPERATIVE REPORT   DESCRIPTION OF PROCEDURE:  After being apprised of risks of anesthesia, infection, bleeding, injury to surrounding organs, possible need for repair, delayed versus immediate complications include bowel and bladder injury, possible need for repair, the patient was brought to the operating room where she was administered general anesthetic without complications, prepped and draped in usual sterile fashion. Catheterized till bladder was empty.  Exam under anesthesia revealed a bulky anteflexed uterus and no adnexal masses.  At this time, dilute Marcaine solution placed 20 mL total for a standard paracervical block. Hysteroscope was placed after dilating the cervix up to a #25 Pratt dilator.  Visualization reveals a posterior wall endometrial polyp, bilateral normal ostia, and no evidence of submucous fibroid at this time.  Polypectomy was performed using standard polyp forceps with removal of polyp in its entirety.  D and C, then performed in a 4- quadrant method reveals cavity to be normal.  At this time, NovaSure measurements were taken with 6.5-cm length, a 4.5-cm width, CO2 test was performed and is negative.  NovaSure was initiated to a power of 161 watts for 1 minute and 8 seconds.  At the end of the procedure, the device was inspected and found to be intact.  Revisualization of endometrial cavity reveals a well ablated endometrial cavity.  No evidence of uterine perforation.  Fluid deficit of 35 mL was noted.  The patient tolerated the procedure well, was awakened.  All instruments having been removed, she was transferred to recovery in good condition.     Lovenia Kim, M.D.     RJT/MEDQ  D:  10/14/2012  T:  10/14/2012  Job:  [id number]

## 2012-10-17 ENCOUNTER — Encounter (HOSPITAL_COMMUNITY): Payer: Self-pay | Admitting: Obstetrics and Gynecology

## 2012-12-01 ENCOUNTER — Other Ambulatory Visit: Payer: Self-pay

## 2012-12-01 DIAGNOSIS — Z1231 Encounter for screening mammogram for malignant neoplasm of breast: Secondary | ICD-10-CM

## 2012-12-14 ENCOUNTER — Ambulatory Visit (INDEPENDENT_AMBULATORY_CARE_PROVIDER_SITE_OTHER): Payer: 59 | Admitting: Internal Medicine

## 2012-12-14 ENCOUNTER — Encounter: Payer: Self-pay | Admitting: Internal Medicine

## 2012-12-14 VITALS — BP 120/80 | HR 72 | Temp 98.0°F | Resp 16 | Ht 59.0 in | Wt 102.0 lb

## 2012-12-14 DIAGNOSIS — D509 Iron deficiency anemia, unspecified: Secondary | ICD-10-CM

## 2012-12-14 DIAGNOSIS — R252 Cramp and spasm: Secondary | ICD-10-CM

## 2012-12-14 DIAGNOSIS — I8390 Asymptomatic varicose veins of unspecified lower extremity: Secondary | ICD-10-CM

## 2012-12-14 DIAGNOSIS — M542 Cervicalgia: Secondary | ICD-10-CM

## 2012-12-14 LAB — CBC WITH DIFFERENTIAL/PLATELET
Basophils Relative: 0.8 % (ref 0.0–3.0)
Eosinophils Absolute: 0.1 10*3/uL (ref 0.0–0.7)
Lymphocytes Relative: 33.6 % (ref 12.0–46.0)
MCHC: 34.4 g/dL (ref 30.0–36.0)
Neutrophils Relative %: 55.1 % (ref 43.0–77.0)
RBC: 4.01 Mil/uL (ref 3.87–5.11)
WBC: 4.1 10*3/uL — ABNORMAL LOW (ref 4.5–10.5)

## 2012-12-14 LAB — MAGNESIUM: Magnesium: 2.1 mg/dL (ref 1.5–2.5)

## 2012-12-14 LAB — POTASSIUM: Potassium: 3.5 mEq/L (ref 3.5–5.1)

## 2012-12-14 MED ORDER — MAGNESIUM 250 MG PO TABS: 1.0000 | ORAL_TABLET | Freq: Every evening | ORAL | Status: DC | PRN

## 2012-12-14 NOTE — Progress Notes (Signed)
  Subjective:    Patient ID: [name], female    DOB: [birth year], 47 y.o.   MRN: [id number]  HPI Status post fibroid surgery doing well Surgeries she had a CBC that showed slight anemia will monitor an iron level and a blood count today to see if she needs vitamin replacement Mild rash Increased varicosities   Review of Systems  Constitutional: Negative for activity change, appetite change and fatigue.  HENT: Negative for ear pain, congestion, neck pain, postnasal drip and sinus pressure.   Eyes: Negative for redness and visual disturbance.  Respiratory: Negative for cough, shortness of breath and wheezing.   Gastrointestinal: Negative for abdominal pain and abdominal distention.  Genitourinary: Negative for dysuria, frequency and menstrual problem.  Musculoskeletal: Negative for myalgias, joint swelling and arthralgias.  Skin: Negative for rash and wound.  Neurological: Negative for dizziness, weakness and headaches.  Hematological: Negative for adenopathy. Does not bruise/bleed easily.  Psychiatric/Behavioral: Negative for sleep disturbance and decreased concentration.       Objective:   Physical Exam  Constitutional: She is oriented to person, place, and time. She appears well-developed and well-nourished. No distress.  HENT:  Head: Normocephalic and atraumatic.  Right Ear: External ear normal.  Left Ear: External ear normal.  Nose: Nose normal.  Mouth/Throat: Oropharynx is clear and moist.  Eyes: Conjunctivae and EOM are normal. Pupils are equal, round, and reactive to light.  Neck: Normal range of motion. Neck supple. No JVD present. No tracheal deviation present. No thyromegaly present.  Cardiovascular: Normal rate, regular rhythm, normal heart sounds and intact distal pulses.   No murmur heard. Pulmonary/Chest: Effort normal and breath sounds normal. She has no wheezes. She exhibits no tenderness.  Abdominal: Soft. Bowel sounds are normal.  Musculoskeletal:  Normal range of motion. She exhibits no edema and no tenderness.  Lymphadenopathy:    She has no cervical adenopathy.  Neurological: She is alert and oriented to person, place, and time. She has normal reflexes. No cranial nerve deficit.  Skin: Skin is warm and dry. She is not diaphoretic.  Psychiatric: She has a normal mood and affect. Her behavior is normal.          Assessment & Plan:  Iron and CBC post op anemia  Asthma history is quite and we discussed the use of the pnemo vaccines  Discussed the post intubation effects of surgery Mild atopic dermatitis  Mild varicosities Needs venous Ultrasound  She has increased cervical strain with neck mid back lower back and even leg pain associated with occupational use of the computer and phone.  Will refer for chiropractory for evaluation and treatment

## 2012-12-21 ENCOUNTER — Encounter (INDEPENDENT_AMBULATORY_CARE_PROVIDER_SITE_OTHER): Payer: 59

## 2012-12-21 DIAGNOSIS — M7989 Other specified soft tissue disorders: Secondary | ICD-10-CM

## 2012-12-21 DIAGNOSIS — I8390 Asymptomatic varicose veins of unspecified lower extremity: Secondary | ICD-10-CM

## 2012-12-21 DIAGNOSIS — M79609 Pain in unspecified limb: Secondary | ICD-10-CM

## 2012-12-27 ENCOUNTER — Telehealth: Payer: Self-pay | Admitting: Internal Medicine

## 2012-12-27 NOTE — Telephone Encounter (Signed)
Pt would like results of labs done  A  few weeks ago. MD had tested her iron. Pt passed out at work today and thinks her iron may be low. Can you pls call pt w/ results? pls advise. Pt asked for montrice since bonnie not here.

## 2012-12-28 NOTE — Telephone Encounter (Signed)
Labs were all OK.  NO low hgb or iron so anemia would not explain her passing out.  She needs to follow up with primary for further evaluation.

## 2012-12-28 NOTE — Telephone Encounter (Signed)
Pt informed

## 2012-12-28 NOTE — Telephone Encounter (Signed)
Pt is asking for results that were done on 12/14/12

## 2013-01-20 ENCOUNTER — Ambulatory Visit
Admission: RE | Admit: 2013-01-20 | Discharge: 2013-01-20 | Disposition: A | Discharge status: Home / Self Care | Payer: 59 | Source: Ambulatory Visit

## 2013-01-20 DIAGNOSIS — Z1231 Encounter for screening mammogram for malignant neoplasm of breast: Secondary | ICD-10-CM

## 2013-03-01 ENCOUNTER — Ambulatory Visit (INDEPENDENT_AMBULATORY_CARE_PROVIDER_SITE_OTHER): Payer: 59 | Admitting: Internal Medicine

## 2013-03-01 ENCOUNTER — Encounter: Payer: Self-pay | Admitting: Internal Medicine

## 2013-03-01 VITALS — BP 110/62 | HR 76 | Ht 59.0 in | Wt 103.0 lb

## 2013-03-01 DIAGNOSIS — J0100 Acute maxillary sinusitis, unspecified: Secondary | ICD-10-CM

## 2013-03-01 DIAGNOSIS — J301 Allergic rhinitis due to pollen: Secondary | ICD-10-CM

## 2013-03-01 MED ORDER — PHENYLEPHRINE HCL 1 % NA SOLN
3.0000 [drp] | Freq: Once | NASAL | Status: AC
  Administered 2013-03-01: 3 [drp] via NASAL

## 2013-03-01 MED ORDER — METHYLPREDNISOLONE ACETATE 40 MG/ML IJ SUSP
40.0000 mg | Freq: Once | INTRAMUSCULAR | Status: AC
  Administered 2013-03-01: 40 mg via INTRAMUSCULAR

## 2013-03-01 NOTE — Progress Notes (Signed)
Patient ID: [name], female    DOB: 05/05/1965, 47 y.o.   MRN: [id number]  HPI 11/21/10 Hosp 1 year ago- dehydration, heat exhaustion, low iron level.  Thinks chronic singulair is bothering stomach. After long time use, then a hiatus, she took one and had diarrhea.  Does saline nasal rinse/ squeeze bottle every morning.  Feels tightness in chest off and on. Some dry cough and tight chest. Always very sensitive to stimulants, including Atrovent HFA, because of tachypalpitation she relates to mitral prolapse.   03/04/11-  69 yoF never smoker returning to this area after last seen 2009. Hx allergic rhinitis, asthma, anxiety. She has had flu vaccine. Has a new job doing Journalist, newspaper. Cold air is tightening in her chest some when she goes outdoors. Occasional dry, tight cough. We have prescribed a nebulizer machine but she turned back in because her insurance wouldn't cover it. She is taking Singulair 4 mg every other day and emphasizes her intolerance to many medications. Minor epistaxis, using saline gel. Still has some left over Tilade nebulizer solution but we discussed options. She volunteers that she is now recognizing the importance of anxiety with many of her respiratory symptoms. PFT 11/26/2010 within normal limits within significant response to bronchodilator. FEV1/FVC 0.79. Results were reviewed with her, including a lengthy discussion of the significance of the different subtests.  08/26/11 -73 yoF never smoker returning to this area after last seen 2009. Hx allergic rhinitis, asthma, anxiety. " breathing too well"; saw Dr Arnoldo Morale last week and breathing heavy. unsure if due to exercise or weather Joined a gym class for exercise. Using Qnasl which she understood to be for nose and chest. Has an old nebulizer machine. She credits Singulair with helping reduce her "inflammation". Little cough, wheeze, phlegm.  02/24/12- 74 yoF never smoker returning to this area after last seen 2009.  Hx allergic rhinitis, asthma, anxiety. Clear colored mucus; congestion since last visit 2 weeks ago diverticulitis was treated with doxycycline without surgery. Told by GI that she has GERD. Has had recent nasal congestion some nose bleeding and head "feels heavy". Minor cough with clear mucus. Qnasl caused malaise. Off of Singulair saying 5 mg dose was better for her stomach.  08/25/12- 61 yoF never smoker followed for allergic rhinitis, asthma. Hx  anxiety. FOLLOWS FOR: pt states that breathing is acting up-for about 1 week having sinus pressure; feels  like throat is full and bringing up colored phelgm. Mucinex caused nausea. Taking Alavert and Children's Sudafed which we discussed. Throat feels raspy with clear mucus.  03/01/13- 66 yoF never smoker followed for allergic rhinitis, asthma. Hx  Anxiety. FOLLOWS FOR: alot of pressure on left side of head, congestion.  Left frontal headache for the past 7-10 days with a left maxillary sinus pressure. She questions acute sinusitis. Little discharge, some clear watery rhinorrhea. Using Sudafed every other day, Advil. Not using Qnasl. Chest feels clear.  Review of Systems-see HPI Constitutional:   No-   weight loss, night sweats, fevers, chills, fatigue, lassitude. HEENT:   + headaches, difficulty swallowing, tooth/dental problems, sore throat,       No-  sneezing, itching, ear ache, +nasal congestion, +post nasal drip,  CV:  No-   chest pain, orthopnea, PND, swelling in lower extremities, anasarca, dizziness, palpitations Resp: +shortness of breath with exertion or at rest.              No-   productive cough,  No non-productive cough,  No- coughing up  of blood.              No-   change in color of mucus.  No recent  wheezing.   Skin: No-   rash or lesions. GI:  No-   heartburn, +indigestion, abdominal pain, nausea, vomiting, GU:  MS:  No-   joint pain or swelling.   Neuro-     nothing unusual Psych:  No- change in mood or affect. No depression  + anxiety.  No memory loss.  Objective:   Physical Exam General- Alert, Oriented, Affect-anxious/ talkative, Distress- none acute, trim, petite woman    Skin- rash-none, lesions- none, excoriation- none Lymphadenopathy- none Head- atraumatic            Eyes- Gross vision intact, PERRLA, conjunctivae clear secretions            Ears- Hearing, canals normal. Scarring or sclerosis TMs            Nose- +Narrow with clear mucus bridging, No- Septal dev, polyps, erosion, perforation             Throat- Mallampati II , mucosa clear , drainage- none, tonsils- atrophic Neck- flexible , trachea midline, no stridor , thyroid nl, carotid no bruit Chest - symmetrical excursion , unlabored           Heart/CV- RRR , no murmur , no gallop  , no rub, nl s1 s2                           - JVD- none , edema- none, stasis changes- none, varices- none           Lung- clear to P&A, wheeze- none, cough- none , dullness-none, rub- none           Chest wall-  Abd-  Br/ Gen/ Rectal- Not done, not indicated Extrem- cyanosis- none, clubbing, none, atrophy- none, strength- nl Neuro- grossly intact to observation

## 2013-03-01 NOTE — Patient Instructions (Signed)
Neb neo nasal   Depo 40  Please call as needed

## 2013-03-08 DIAGNOSIS — J0100 Acute maxillary sinusitis, unspecified: Secondary | ICD-10-CM | POA: Insufficient documentation

## 2013-03-08 NOTE — Assessment & Plan Note (Signed)
Usually good control

## 2013-03-08 NOTE — Assessment & Plan Note (Signed)
Plan-nasal nebulizer decongestant, Depo-Medrol. Discussed saline rinse and Sudafed. Consider antibiotic if she does not clear quickly

## 2013-04-17 ENCOUNTER — Telehealth: Payer: Self-pay | Admitting: Internal Medicine

## 2013-04-17 MED ORDER — DOXYCYCLINE HYCLATE 100 MG PO TABS: ORAL_TABLET | ORAL | Status: DC

## 2013-04-17 NOTE — Telephone Encounter (Signed)
Spoke to pt. States that she thinks that she has a sinus infection. Reports sinus congestion with production of yellow mucus, dry cough x1 week. Had flu shot on 01/18/13. Would like a low dose abx called in.  Allergies  Allergen Reactions  . Aspirin Shortness Of Breath    "irritated stomach" Ibuprofen and Naproxen do not agree with her either  . Latex Shortness Of Breath and Itching  . Other Shortness Of Breath    peanuts  . Sulfamethoxazole-Trimethoprim Shortness Of Breath  . Sulfonamide Derivatives Shortness Of Breath and Itching  . Sulphadimidine Sodium [Sulfamethazine Sodium] Shortness Of Breath    Added to food for preservative  . Amoxicillin     Doesn't remember rxn  . Carisoprodol   . Ciprofloxacin     REACTION: SOB, "Tightness in head"  . Clarithromycin     "Doesn't agree with me"  . Codeine     Does not take percocet or hydrocodone; tylenol only  . Epinephrine     Heart racing  . Metronidazole Other (See Comments)    "cannot tolerate"  . Penicillins     Doesn't remember reaction    Current Outpatient Prescriptions on File Prior to Visit  Medication Sig Dispense Refill  . Magnesium 250 MG TABS Take 1 tablet (250 mg total) by mouth at bedtime as needed and may repeat dose one time if needed.  60 tablet  3  . Multiple Vitamin (MULTIVITAMIN WITH MINERALS) TABS Take 1 tablet by mouth daily.      . Probiotic Product (PROBIOTIC DAILY PO) Take 1 tablet by mouth daily.       No current facility-administered medications on file prior to visit.    CY - please advise. Thanks.

## 2013-04-17 NOTE — Telephone Encounter (Signed)
Per CY-lets give Doxycycline 100 mg #8 take 2 today then 1 daily no refills.

## 2013-04-17 NOTE — Telephone Encounter (Signed)
Doxy rx sent to [facility] in [place] to inform pt

## 2013-04-17 NOTE — Telephone Encounter (Signed)
Pt returned call.  I advised Doxy Rx was sent to [facility] in [place].  Pt verbalized understanding & states nothing further needed at this time.  Satira Anis

## 2013-05-02 ENCOUNTER — Telehealth: Payer: Self-pay | Admitting: Internal Medicine

## 2013-05-02 NOTE — Telephone Encounter (Signed)
Pt states she has sore throat and cough w/ drainage. Pt called pulmonary dr who sent in antibiotic which helped w/ sinus. But pt still has the other. Pt made appt w/ dr Tarri Fuller, but not sure if he could help w/ this. Wanted to see dr Arnoldo Morale. Asked if I would send a message to perhaps get rx phoned in.  [name]/ [place]/ [facility] Pt will be traveling to [place] due to mom's passing,

## 2013-05-02 NOTE — Telephone Encounter (Signed)
Completed anitibioitc given by dr young- suggested mucinex fast max cough and cold

## 2013-05-04 ENCOUNTER — Encounter (INDEPENDENT_AMBULATORY_CARE_PROVIDER_SITE_OTHER): Payer: Self-pay

## 2013-05-04 ENCOUNTER — Encounter: Payer: Self-pay | Admitting: Internal Medicine

## 2013-05-04 ENCOUNTER — Ambulatory Visit: Payer: 59 | Admitting: Internal Medicine

## 2013-05-04 ENCOUNTER — Ambulatory Visit (INDEPENDENT_AMBULATORY_CARE_PROVIDER_SITE_OTHER): Payer: 59 | Admitting: Internal Medicine

## 2013-05-04 VITALS — BP 92/64 | HR 105 | Ht 59.0 in | Wt 103.0 lb

## 2013-05-04 DIAGNOSIS — J45909 Unspecified asthma, uncomplicated: Secondary | ICD-10-CM

## 2013-05-04 DIAGNOSIS — J0100 Acute maxillary sinusitis, unspecified: Secondary | ICD-10-CM

## 2013-05-04 MED ORDER — AZITHROMYCIN 250 MG PO TABS: ORAL_TABLET | ORAL | Status: DC

## 2013-05-04 NOTE — Patient Instructions (Signed)
Ok to try alavert/ loratadine - perhaps 1/2 tab- if needed  Script to hold for Z pak if needed  Please call if needed

## 2013-05-04 NOTE — Progress Notes (Signed)
Patient ID: [name], female    DOB: 02/15/1966, 48 y.o.   MRN: [id number]  HPI 11/21/10 Hosp 1 year ago- dehydration, heat exhaustion, low iron level.  Thinks chronic singulair is bothering stomach. After long time use, then a hiatus, she took one and had diarrhea.  Does saline nasal rinse/ squeeze bottle every morning.  Feels tightness in chest off and on. Some dry cough and tight chest. Always very sensitive to stimulants, including Atrovent HFA, because of tachypalpitation she relates to mitral prolapse.   03/04/11-  35 yoF never smoker returning to this area after last seen 2009. Hx allergic rhinitis, asthma, anxiety. She has had flu vaccine. Has a new job doing Journalist, newspaper. Cold air is tightening in her chest some when she goes outdoors. Occasional dry, tight cough. We have prescribed a nebulizer machine but she turned back in because her insurance wouldn't cover it. She is taking Singulair 4 mg every other day and emphasizes her intolerance to many medications. Minor epistaxis, using saline gel. Still has some left over Tilade nebulizer solution but we discussed options. She volunteers that she is now recognizing the importance of anxiety with many of her respiratory symptoms. PFT 11/26/2010 within normal limits within significant response to bronchodilator. FEV1/FVC 0.79. Results were reviewed with her, including a lengthy discussion of the significance of the different subtests.  08/26/11 -43 yoF never smoker returning to this area after last seen 2009. Hx allergic rhinitis, asthma, anxiety. " breathing too well"; saw Dr Arnoldo Morale last week and breathing heavy. unsure if due to exercise or weather Joined a gym class for exercise. Using Qnasl which she understood to be for nose and chest. Has an old nebulizer machine. She credits Singulair with helping reduce her "inflammation". Little cough, wheeze, phlegm.  02/24/12- 53 yoF never smoker returning to this area after last seen 2009.  Hx allergic rhinitis, asthma, anxiety. Clear colored mucus; congestion since last visit 2 weeks ago diverticulitis was treated with doxycycline without surgery. Told by GI that she has GERD. Has had recent nasal congestion some nose bleeding and head "feels heavy". Minor cough with clear mucus. Qnasl caused malaise. Off of Singulair saying 5 mg dose was better for her stomach.  08/25/12- 60 yoF never smoker followed for allergic rhinitis, asthma. Hx  anxiety. FOLLOWS FOR: pt states that breathing is acting up-for about 1 week having sinus pressure; feels  like throat is full and bringing up colored phelgm. Mucinex caused nausea. Taking Alavert and Children's Sudafed which we discussed. Throat feels raspy with clear mucus.  03/01/13- 43 yoF never smoker followed for allergic rhinitis, asthma. Hx  Anxiety. FOLLOWS FOR: alot of pressure on left side of head, congestion.  Left frontal headache for the past 7-10 days with a left maxillary sinus pressure. She questions acute sinusitis. Little discharge, some clear watery rhinorrhea. Using Sudafed every other day, Advil. Not using Qnasl. Chest feels clear.  05/04/13- 53 yoF never smoker followed for allergic rhinitis, asthma. Hx  Anxiety. FOLLOWS FOR: States that she was given doxycycline last week by [place] for URI. Now infection has moved to her chest. Has lots of chest congestion and coughing. Mother died-autopsy pending We had sent doxycycline. Sinus is now clear. Still some postnasal drip and episodes of hard coughing with clear mucus.Loratadine helps more than other products tried.  Review of Systems-see HPI Constitutional:   No-   weight loss, night sweats, fevers, chills, fatigue, lassitude. HEENT:   + headaches, difficulty swallowing, tooth/dental problems,  sore throat,       No-  sneezing, itching, ear ache, +nasal congestion, +post nasal drip,  CV:  No-   chest pain, orthopnea, PND, swelling in lower extremities, anasarca, dizziness,  palpitations Resp: +shortness of breath with exertion or at rest.              + productive cough,  No non-productive cough,  No- coughing up of blood.              No-   change in color of mucus.  No recent  wheezing.   Skin: No-   rash or lesions. GI:  No-   heartburn, indigestion, abdominal pain, nausea, vomiting, GU:  MS:  No-   joint pain or swelling.   Neuro-     nothing unusual Psych:  No- change in mood or affect. No depression + anxiety.  No memory loss.  Objective:   Physical Exam General- Alert, Oriented, Affect-anxious/ talkative, Distress- none acute, trim, petite woman    Skin- rash-none, lesions- none, excoriation- none Lymphadenopathy- none Head- atraumatic            Eyes- Gross vision intact, PERRLA, conjunctivae clear secretions            Ears- Hearing, canals normal. +Scarring or sclerosis TMs            Nose- +Narrow with clear mucus bridging, No- Septal dev, polyps, erosion, perforation             Throat- Mallampati II , mucosa clear , drainage- none, tonsils- atrophic Neck- flexible , trachea midline, no stridor , thyroid nl, carotid no bruit Chest - symmetrical excursion , unlabored           Heart/CV- RRR , no murmur , no gallop  , no rub, nl s1 s2                           - JVD- none , edema- none, stasis changes- none, varices- none           Lung- clear to P&A, wheeze- none, cough- none , dullness-none, rub- none           Chest wall-  Abd-  Br/ Gen/ Rectal- Not done, not indicated Extrem- cyanosis- none, clubbing, none, atrophy- none, strength- nl Neuro- grossly intact to observation

## 2013-05-09 ENCOUNTER — Telehealth: Payer: Self-pay | Admitting: Internal Medicine

## 2013-05-09 NOTE — Telephone Encounter (Signed)
Patient Information:  Caller Name: Hilda  Phone: [phone]  Patient: [name], [name]  Gender: Female  DOB: [birth year]  Age: 48 Years  PCP: Benay Pillow (Adults only)  Pregnant: No  Office Follow Up:  Does the office need to follow up with this patient?: No  Instructions For The Office: N/A  RN Note:  Pt has appt on Thursday, 05/11/13 with Dr. Elease Hashimoto.  Symptoms  Reason For Call & Symptoms: Pt calling regarding anxiety since mom passed on Apr 29, 2014.  Pt states she has had some fluttery feeling in her chest with SOB at the time; only today. Pt started back to work today after mom passed away on Apr 29, 2014. Not having any of those chest flutters/palpatations or SOB now. Easily resolved with no intervention. Pt has a hx of anxiety related palp in the past. Also has mild cold like sx; cough and stuffy nose but much better when started 2-3 weeks ago. Pt has an appt for Thursday, 05/11/13 w/Dr. Elease Hashimoto.  Reviewed Health History In EMR: Yes  Reviewed Medications In EMR: Yes  Reviewed Allergies In EMR: Yes  Reviewed Surgeries / Procedures: Yes  Date of Onset of Symptoms: 05/09/2013 OB / GYN:  LMP: Unknown  Guideline(s) Used:  Heart Rate and Heartbeat Questions  Disposition Per Guideline:   See Within 2 Weeks in Office  Reason For Disposition Reached:   Problems with anxiety or stress  Advice Given:  Reassurance  Everybody has palpitations at some point in their lives. Sometimes it is simply a heightened awareness of the heart's normal beating.  Occasional extra heart beats are experienced by most everyone. Lack of sleep, stress, and caffeinated beverages can make palpitations worse.  Patients with anxiety or stress may describe a "rapid heartbeat" or "pounding" in their chest from their heart beating.  Health Basics  Sleep: Try to get sufficient amount of sleep. Lack of sleep can aggravate palpitations. Most people need 7-8 hours of sleep each night.  Exercise: Regular  exercise will improve your overall health, improve your mood, and is a simple method to reduce stress.  Diet: Eat a balanced healthy diet.  Liquid Intake: Drink adequate liquids, 6-8 glasses of water daily.  Expected Course  : If your symptoms do not improve over the next couple days then you should make an appointment to see your doctor.  Call Back If:  Chest pain, lightheadedness, or difficulty breathing occurs  Heart beating more than 130 beats / minute  More than 3 extra or skipped beats / minute  You become worse.  Patient Will Follow Care Advice:  YES

## 2013-05-09 NOTE — Telephone Encounter (Signed)
Talked with patient and gave sympathy.  Suggested she come in for ov.  States cant come until day off on Thursday with dr burchette.  Appointment given on Thursday. instructions given to pt if she had frank chest pain with sob with heart palpitations, she would need to go to ed.

## 2013-05-11 ENCOUNTER — Encounter: Payer: Self-pay | Admitting: Family Medicine

## 2013-05-11 ENCOUNTER — Ambulatory Visit (INDEPENDENT_AMBULATORY_CARE_PROVIDER_SITE_OTHER): Payer: 59 | Admitting: Family Medicine

## 2013-05-11 VITALS — BP 120/80 | HR 74 | Temp 97.6°F | Wt 103.0 lb

## 2013-05-11 DIAGNOSIS — F4321 Adjustment disorder with depressed mood: Secondary | ICD-10-CM

## 2013-05-11 DIAGNOSIS — R002 Palpitations: Secondary | ICD-10-CM

## 2013-05-11 DIAGNOSIS — R059 Cough, unspecified: Secondary | ICD-10-CM

## 2013-05-11 DIAGNOSIS — R05 Cough: Secondary | ICD-10-CM

## 2013-05-11 NOTE — Progress Notes (Signed)
Subjective:    Patient ID: [name], female    DOB: [birth year], 48 y.o.   MRN: [id number]  HPI Patient seen with recent cough and palpitations She has history of some chronic anxiety and her mother passed away 05/02/2023 in [place]. Patient had difficulty adapting since then. She's had increased anxiety symptoms. 2 days ago at work she had one hour history of some fluttering sensation palpitations in her chest. No chest pains. No dizziness. No syncope. She had consumed increased tea earlier in the day.  She does not take any decongestants. She's had no recurrence of symptoms since then. She's had no difficulties with exercising. Generally does not consume caffeine.  Persistent cough for 2 weeks. Mostly nonproductive. No fevers or chills. No history of wheezing or any dyspnea. She's had some intermittent headaches. No pleuritic pain. Nonsmoker  Past Medical History  Diagnosis Date  . Mitral valve disorders   . Contact dermatitis and other eczema, due to unspecified cause   . Esophageal reflux   . Lumbago   . IBS (irritable bowel syndrome)   . Anxiety   . Anemia   . IC (interstitial cystitis)   . Internal hemorrhoids   . Diverticulitis     CT Scan  . Asthma     allergy related   Past Surgical History  Procedure Laterality Date  . Cesarean section    . Sinus surgery with instatrak    . Mouth surgery    . Dilitation & currettage/hystroscopy with novasure ablation N/A 10/14/2012    Procedure: DILATATION & CURETTAGE/HYSTEROSCOPY WITH NOVASURE ABLATION;  Surgeon: Lovenia Kim, MD;  Location: [facility];  Service: Gynecology;  Laterality: N/A;    reports that she has never smoked. She has never used smokeless tobacco. She reports that she does not drink alcohol or use illicit drugs. family history includes Alcohol abuse in an other family member; Colon polyps in her sister; Diabetes in her mother; Heart attack in her father; Hyperlipidemia in her father; Hypertension in her  father and mother. There is no history of Colon cancer. Allergies  Allergen Reactions  . Aspirin Shortness Of Breath    "irritated stomach" Ibuprofen and Naproxen do not agree with her either  . Latex Shortness Of Breath and Itching  . Other Shortness Of Breath    peanuts  . Sulfamethoxazole-Trimethoprim Shortness Of Breath  . Sulfonamide Derivatives Shortness Of Breath and Itching  . Sulphadimidine Sodium [Sulfamethazine Sodium] Shortness Of Breath    Added to food for preservative  . Amoxicillin     Doesn't remember rxn  . Carisoprodol   . Ciprofloxacin     REACTION: SOB, "Tightness in head"  . Clarithromycin     "Doesn't agree with me"  . Codeine     Does not take percocet or hydrocodone; tylenol only  . Epinephrine     Heart racing  . Metronidazole Other (See Comments)    "cannot tolerate"  . Penicillins     Doesn't remember reaction      Review of Systems  Constitutional: Positive for fatigue. Negative for fever and chills.  HENT: Positive for congestion.   Respiratory: Positive for cough. Negative for shortness of breath and wheezing.   Cardiovascular: Positive for palpitations. Negative for chest pain and leg swelling.  Psychiatric/Behavioral: Positive for sleep disturbance and dysphoric mood. Negative for suicidal ideas, confusion and agitation. The patient is nervous/anxious.        Objective:   Physical Exam  Constitutional: She is oriented  to person, place, and time. She appears well-developed and well-nourished.  HENT:  Right Ear: External ear normal.  Left Ear: External ear normal.  Mouth/Throat: Oropharynx is clear and moist.  Neck: Neck supple. No thyromegaly present.  Cardiovascular: Normal rate and regular rhythm.  Exam reveals no gallop.   No murmur heard. Pulmonary/Chest: Effort normal and breath sounds normal. No respiratory distress. She has no wheezes. She has no rales.  Neurological: She is alert and oriented to person, place, and time. No  cranial nerve deficit.          Assessment & Plan:  #1 palpitations. Possibly related to caffeine consumption and also she has chronic underlying anxiety. EKG sinus rhythm with no acute changes. Reassurance given. She does not have any red flag for worrisome palpitations. Avoid caffeine, decongestants, chocolate, alcohol. #2 Cough. Suspect acute viral bronchitis. Nonfocal exam. Reassurance. #3 grief reaction. We've offered support through counseling and she was given a couple of names. We've encouraged her to stay active with exercise if possible and stay engaged in her usual activities as much as possible

## 2013-05-11 NOTE — Patient Instructions (Signed)
Palpitations   A palpitation is the feeling that your heartbeat is irregular or is faster than normal. It may feel like your heart is fluttering or skipping a beat. Palpitations are usually not a serious problem. However, in some cases, you may need further medical evaluation.  CAUSES   Palpitations can be caused by:   Smoking.   Caffeine or other stimulants, such as diet pills or energy drinks.   Alcohol.   Stress and anxiety.   Strenuous physical activity.   Fatigue.   Certain medicines.   Heart disease, especially if you have a history of arrhythmias. This includes atrial fibrillation, atrial flutter, or supraventricular tachycardia.   An improperly working pacemaker or defibrillator.  DIAGNOSIS   To find the cause of your palpitations, your caregiver will take your history and perform a physical exam. Tests may also be done, including:   Electrocardiography (ECG). This test records the heart's electrical activity.   Cardiac monitoring. This allows your caregiver to monitor your heart rate and rhythm in real time.   Holter monitor. This is a portable device that records your heartbeat and can help diagnose heart arrhythmias. It allows your caregiver to track your heart activity for several days, if needed.   Stress tests by exercise or by giving medicine that makes the heart beat faster.  TREATMENT   Treatment of palpitations depends on the cause of your symptoms and can vary greatly. Most cases of palpitations do not require any treatment other than time, relaxation, and monitoring your symptoms. Other causes, such as atrial fibrillation, atrial flutter, or supraventricular tachycardia, usually require further treatment.  HOME CARE INSTRUCTIONS    Avoid:   Caffeinated coffee, tea, soft drinks, diet pills, and energy drinks.   Chocolate.   Alcohol.   Stop smoking if you smoke.   Reduce your stress and anxiety. Things that can help you relax include:   A method that measures bodily functions so  you can learn to control them (biofeedback).   Yoga.   Meditation.   Physical activity such as swimming, jogging, or walking.   Get plenty of rest and sleep.  SEEK MEDICAL CARE IF:    You continue to have a fast or irregular heartbeat beyond 24 hours.   Your palpitations occur more often.  SEEK IMMEDIATE MEDICAL CARE IF:   You develop chest pain or shortness of breath.   You have a severe headache.   You feel dizzy, or you faint.  MAKE SURE YOU:   Understand these instructions.   Will watch your condition.   Will get help right away if you are not doing well or get worse.  Document Released: 04/10/2000 Document Revised: 08/08/2012 Document Reviewed: 06/12/2011  [employer] Patient Information 2014 [employer]

## 2013-05-11 NOTE — Progress Notes (Signed)
Pre visit review using our clinic review tool, if applicable. No additional management support is needed unless otherwise documented below in the visit note. 

## 2013-05-28 ENCOUNTER — Encounter: Payer: Self-pay | Admitting: Internal Medicine

## 2013-05-28 NOTE — Assessment & Plan Note (Signed)
controlled 

## 2013-05-28 NOTE — Assessment & Plan Note (Signed)
Recent sinusitis/upper respiratory infection, now with residual clear postnasal drip. Plan-okay to use OTC antihistamine like loratadine. Discussed role of throat lozenges, nasal saline

## 2013-05-29 LAB — HM PAP SMEAR

## 2013-07-18 ENCOUNTER — Telehealth: Payer: Self-pay | Admitting: Internal Medicine

## 2013-07-18 NOTE — Telephone Encounter (Signed)
Ok to see TP sooner than she can get in to see me

## 2013-07-18 NOTE — Telephone Encounter (Signed)
Called and spoke with pt. She reports the side of her face feels warm and ? Swollen, she hears crackling sound in her chest when she tries to take a deep breathe. She scheduled appt fro Monday to see CDY. Can pt come in and see TP? thanks

## 2013-07-18 NOTE — Telephone Encounter (Signed)
lmomtcb x1 for pt to schedule sooner appt.

## 2013-07-19 NOTE — Telephone Encounter (Signed)
Ok for her to take her Z pak

## 2013-07-19 NOTE — Telephone Encounter (Signed)
I spoke with the pt and she states she has a Zpak prescribed by CY to have on hold and she wants to know if she can just take this and see if it helps her symptoms. Please advise if ok to take? [place] Bing, CMA

## 2013-07-19 NOTE — Telephone Encounter (Signed)
Pt returned call.  Pt aware to take the ZPak & will see CY on 3/30 @ 9:15 AM.  Pt verbalized understanding & states nothing further needed.  [name]

## 2013-07-19 NOTE — Telephone Encounter (Signed)
[email or web address].Brynna Dobos, CMA  

## 2013-07-19 NOTE — Telephone Encounter (Signed)
Pt returned call.  Holly D Pryor ° °

## 2013-07-19 NOTE — Telephone Encounter (Signed)
LMOMTCB x 1 

## 2013-07-24 ENCOUNTER — Ambulatory Visit (INDEPENDENT_AMBULATORY_CARE_PROVIDER_SITE_OTHER): Payer: 59 | Admitting: Internal Medicine

## 2013-07-24 ENCOUNTER — Encounter: Payer: Self-pay | Admitting: Internal Medicine

## 2013-07-24 VITALS — BP 118/80 | HR 75 | Ht 59.0 in | Wt 106.4 lb

## 2013-07-24 DIAGNOSIS — M26629 Arthralgia of temporomandibular joint, unspecified side: Secondary | ICD-10-CM

## 2013-07-24 DIAGNOSIS — J45909 Unspecified asthma, uncomplicated: Secondary | ICD-10-CM

## 2013-07-24 DIAGNOSIS — J301 Allergic rhinitis due to pollen: Secondary | ICD-10-CM

## 2013-07-24 MED ORDER — AZELASTINE HCL 0.15 % NA SOLN: NASAL | Status: DC

## 2013-07-24 MED ORDER — PHENYLEPHRINE HCL 1 % NA SOLN
3.0000 [drp] | Freq: Once | NASAL | Status: AC
Start: 2013-07-24 — End: 2013-07-24
  Administered 2013-07-24: 3 [drp] via NASAL

## 2013-07-24 NOTE — Patient Instructions (Addendum)
Neb neo nasal  Try NSAID like aspirin or ibuprofen  If it keeps bothering you, see your dentist.  Script for Astepro/ azelastine sent

## 2013-07-24 NOTE — Progress Notes (Signed)
Patient ID: Maddix Cotherman, female    DOB: 07-09-1965, 48 y.o.   MRN: [id number]  HPI 11/21/10 Hosp 1 year ago- dehydration, heat exhaustion, low iron level.  Thinks chronic singulair is bothering stomach. After long time use, then a hiatus, she took one and had diarrhea.  Does saline nasal rinse/ squeeze bottle every morning.  Feels tightness in chest off and on. Some dry cough and tight chest. Always very sensitive to stimulants, including Atrovent HFA, because of tachypalpitation she relates to mitral prolapse.   03/04/11-  19 yoF never smoker returning to this area after last seen 2009. Hx allergic rhinitis, asthma, anxiety. She has had flu vaccine. Has a new job doing Journalist, newspaper. Cold air is tightening in her chest some when she goes outdoors. Occasional dry, tight cough. We have prescribed a nebulizer machine but she turned back in because her insurance wouldn't cover it. She is taking Singulair 4 mg every other day and emphasizes her intolerance to many medications. Minor epistaxis, using saline gel. Still has some left over Tilade nebulizer solution but we discussed options. She volunteers that she is now recognizing the importance of anxiety with many of her respiratory symptoms. PFT 11/26/2010 within normal limits within significant response to bronchodilator. FEV1/FVC 0.79. Results were reviewed with her, including a lengthy discussion of the significance of the different subtests.  08/26/11 -41 yoF never smoker returning to this area after last seen 2009. Hx allergic rhinitis, asthma, anxiety. " breathing too well"; saw Dr Arnoldo Morale last week and breathing heavy. unsure if due to exercise or weather Joined a gym class for exercise. Using Qnasl which she understood to be for nose and chest. Has an old nebulizer machine. She credits Singulair with helping reduce her "inflammation". Little cough, wheeze, phlegm.  02/24/12- 46 yoF never smoker returning to this area after last seen 2009.  Hx allergic rhinitis, asthma, anxiety. Clear colored mucus; congestion since last visit 2 weeks ago diverticulitis was treated with doxycycline without surgery. Told by GI that she has GERD. Has had recent nasal congestion some nose bleeding and head "feels heavy". Minor cough with clear mucus. Qnasl caused malaise. Off of Singulair saying 5 mg dose was better for her stomach.  08/25/12- 76 yoF never smoker followed for allergic rhinitis, asthma. Hx  anxiety. FOLLOWS FOR: pt states that breathing is acting up-for about 1 week having sinus pressure; feels  like throat is full and bringing up colored phelgm. Mucinex caused nausea. Taking Alavert and Children's Sudafed which we discussed. Throat feels raspy with clear mucus.  03/01/13- 73 yoF never smoker followed for allergic rhinitis, asthma. Hx  Anxiety. FOLLOWS FOR: alot of pressure on left side of head, congestion.  Left frontal headache for the past 7-10 days with a left maxillary sinus pressure. She questions acute sinusitis. Little discharge, some clear watery rhinorrhea. Using Sudafed every other day, Advil. Not using Qnasl. Chest feels clear.  05/04/13- 88 yoF never smoker followed for allergic rhinitis, asthma. Hx  Anxiety. FOLLOWS FOR: States that she was given doxycycline last week by [place] for URI. Now infection has moved to her chest. Has lots of chest congestion and coughing. Mother died-autopsy pending We had sent doxycycline. Sinus is now clear. Still some postnasal drip and episodes of hard coughing with clear mucus.Loratadine helps more than other products tried.  07/24/13- 67 yoF never smoker followed for allergic rhinitis, asthma. Hx  Anxiety. ACUTE VISIT: Left ear(pressure and achy) and jaw pain-can't open mouth all the way;  crackle sounds(noticed when doing yoga). Completed Zpak last night; nausea feelings as well. Notices crackles in chest when lying down on her back. Recognized she was clenching her teeth after a trip to  [place]-husband"s driving made her anxious.  Review of Systems-see HPI Constitutional:   No-   weight loss, night sweats, fevers, chills, fatigue, lassitude. HEENT:   + headaches, difficulty swallowing, tooth/dental problems, sore throat,       No-  sneezing, itching, +ear ache, +nasal congestion, +post nasal drip,  CV:  No-   chest pain, orthopnea, PND, swelling in lower extremities, anasarca, dizziness, palpitations Resp: +shortness of breath with exertion or at rest.              No-productive cough,  No non-productive cough,  No- coughing up of blood.              No-   change in color of mucus.  No recent  wheezing.   Skin: No-   rash or lesions. GI:  No-   heartburn, indigestion, abdominal pain, nausea, vomiting, GU:  MS:  No-   joint pain or swelling.   Neuro-     nothing unusual Psych:  No- change in mood or affect. No depression + anxiety.  No memory loss.  Objective:   Physical Exam General- Alert, Oriented, Affect-anxious/ talkative, Distress- none acute, trim, petite woman    Skin- rash-none, lesions- none, excoriation- none Lymphadenopathy- none Head- atraumatic            Eyes- Gross vision intact, PERRLA, conjunctivae clear secretions            Ears- Hearing, canals normal. +Scarring or sclerosis TMs            Nose- +Narrow with clear mucus bridging, No- Septal dev, polyps, erosion, perforation             Throat- Mallampati II , mucosa clear , drainage- none, tonsils- atrophic Neck- flexible , trachea midline, no stridor , thyroid nl, carotid no bruit Chest - symmetrical excursion , unlabored           Heart/CV- RRR , no murmur , no gallop  , no rub, nl s1 s2                           - JVD- none , edema- none, stasis changes- none, varices- none           Lung- clear to P&A, wheeze- none, cough- none , dullness-none, rub- none           Chest wall-  Abd-  Br/ Gen/ Rectal- Not done, not indicated Extrem- cyanosis- none, clubbing, none, atrophy- none, strength-  nl Neuro- grossly intact to observation

## 2013-08-07 ENCOUNTER — Telehealth: Payer: Self-pay | Admitting: Internal Medicine

## 2013-08-07 NOTE — Telephone Encounter (Signed)
LMTCB

## 2013-08-07 NOTE — Telephone Encounter (Signed)
Pt states Dr. Arnoldo Morale  recommended another PCP for her to establish care with, however she does not remember the physician's name.  She thinks it was Dr. Flonnie Overman??  Please ask PCP the name of the provider.

## 2013-08-11 ENCOUNTER — Other Ambulatory Visit: Payer: Self-pay | Admitting: Emergency Medicine

## 2013-08-11 ENCOUNTER — Telehealth: Payer: Self-pay | Admitting: Internal Medicine

## 2013-08-11 MED ORDER — AZELASTINE HCL 0.15 % NA SOLN: NASAL | Status: DC

## 2013-08-11 NOTE — Telephone Encounter (Signed)
Error

## 2013-08-11 NOTE — Telephone Encounter (Signed)
Spoke with pharmacist and he states that within the last 2 weeks they can no longer get 0.15% solution it will only be .01%.  Called this refill into pharmacy.  Unable to choose this strength in EMR. So added it to the text of rx.

## 2013-08-16 ENCOUNTER — Ambulatory Visit (INDEPENDENT_AMBULATORY_CARE_PROVIDER_SITE_OTHER): Payer: 59 | Admitting: Family Medicine

## 2013-08-16 ENCOUNTER — Encounter: Payer: Self-pay | Admitting: Family Medicine

## 2013-08-16 VITALS — BP 100/64 | HR 87 | Temp 98.5°F | Ht 59.0 in | Wt 105.0 lb

## 2013-08-16 DIAGNOSIS — M26609 Unspecified temporomandibular joint disorder, unspecified side: Secondary | ICD-10-CM

## 2013-08-16 DIAGNOSIS — H9202 Otalgia, left ear: Secondary | ICD-10-CM

## 2013-08-16 DIAGNOSIS — H9209 Otalgia, unspecified ear: Secondary | ICD-10-CM

## 2013-08-16 DIAGNOSIS — J301 Allergic rhinitis due to pollen: Secondary | ICD-10-CM

## 2013-08-16 MED ORDER — FLUTICASONE PROPIONATE 50 MCG/ACT NA SUSP: 2.0000 | Freq: Every day | NASAL | Status: DC

## 2013-08-16 NOTE — Patient Instructions (Signed)
-  afrin for 4 days then stop  -start the flonase 2 sprays each nostril dailly for 1 month, then 1 spray each nostril   -take a daily oral antihistamine such as allegra or claritin of alavert  -do the exercises provided for your jaw  -follow up in about month

## 2013-08-16 NOTE — Progress Notes (Signed)
No chief complaint on file.   HPI:  L ear "clogged": -started: about 1 month ago -symptoms: nasal congestion, cough, sneezing, PND, L ear pressure and fullness -denies:fever, SOB, NVD, tooth pain -has tried: does nasal saline, -sick contacts/travel/risks: denies flu exposure, tick exposure or or Ebola risks -Hx of: allergies - had allergy shots in the past, doing allovert, not doin a nasal spray  TMJ: -jaw lock occ -told could see specialist   ROS: See pertinent positives and negatives per HPI.  Past Medical History  Diagnosis Date  . Mitral valve disorders   . Contact dermatitis and other eczema, due to unspecified cause   . Esophageal reflux   . Lumbago   . IBS (irritable bowel syndrome)   . Anxiety   . Anemia   . IC (interstitial cystitis)   . Internal hemorrhoids   . Diverticulitis     CT Scan  . Asthma     allergy related    Past Surgical History  Procedure Laterality Date  . Cesarean section    . Sinus surgery with instatrak    . Mouth surgery    . Dilitation & currettage/hystroscopy with novasure ablation N/A 10/14/2012    Procedure: DILATATION & CURETTAGE/HYSTEROSCOPY WITH NOVASURE ABLATION;  Surgeon: Lovenia Loc Feinstein, MD;  Location: [facility];  Service: Gynecology;  Laterality: N/A;    Family History  Problem Relation Age of Onset  . Colon polyps Sister   . Diabetes Mother   . Hypertension Mother   . Hypertension Father   . Hyperlipidemia Father   . Alcohol abuse      Family history  . Colon cancer Neg Hx   . Heart attack Father     History   Social History  . Marital Status: Married    Spouse Name: N/A    Number of Children: N/A  . Years of Education: N/A   Occupational History  . [employer]     Social History Main Topics  . Smoking status: Never Smoker   . Smokeless tobacco: Never Used  . Alcohol Use: No  . Drug Use: No  . Sexual Activity: Yes   Other Topics Concern  . None   Social History Narrative  . None    Current outpatient  prescriptions:EVENING PRIMROSE OIL PO, Take by mouth., Disp: , Rfl: ;  Magnesium 250 MG TABS, Take 1 tablet (250 mg total) by mouth at bedtime as needed and may repeat dose one time if needed., Disp: 60 tablet, Rfl: 3;  Multiple Vitamin (MULTIVITAMIN WITH MINERALS) TABS, Take 1 tablet by mouth daily., Disp: , Rfl: ;  Probiotic Product (PROBIOTIC DAILY PO), Take 1 tablet by mouth daily., Disp: , Rfl:  fluticasone (FLONASE) 50 MCG/ACT nasal spray, Place 2 sprays into both nostrils daily., Disp: 16 g, Rfl: 6  EXAM:  Filed Vitals:   08/16/13 0931  BP: 100/64  Pulse: 87  Temp: 98.5 F (36.9 C)    Body mass index is 21.2 kg/(m^2).  GENERAL: vitals reviewed and listed above, alert, oriented, appears well hydrated and in no acute distress  HEENT: atraumatic, conjunttiva clear, no obvious abnormalities on inspection of external nose and ears, normal appearance of ear canals and TMs, clear nasal congestion, mild post oropharyngeal erythema with PND, no tonsillar edema or exudate, no sinus TTP Mild clicking and deviation of jaw on opening  NECK: no obvious masses on inspection  LUNGS: clear to auscultation bilaterally, no wheezes, rales or rhonchi, good air movement  CV: HRRR, no peripheral edema  MS: moves all extremities without noticeable abnormality  PSYCH: pleasant and cooperative, no obvious depression or anxiety  ASSESSMENT AND PLAN:  Discussed the following assessment and plan:  Allergic rhinitis due to pollen - Plan: fluticasone (FLONASE) 50 MCG/ACT nasal spray -flonase, antihistamine, afrin for 4 days then stop -follow up 1 month  TMJ (temporomandibular joint disorder) -HEP provided, follow up 1 month  Ear pain, left -likely eustachian tube dysfunction or TMJ - tx per above and follow up in 1 month  -of course, we advised to return or notify a doctor immediately if symptoms worsen or persist or new concerns arise.    Patient Instructions  -afrin for 4 days then  stop  -start the flonase 2 sprays each nostril dailly for 1 month, then 1 spray each nostril   -take a daily oral antihistamine such as allegra or claritin of alavert  -do the exercises provided for your jaw  -follow up in about month     [name]

## 2013-08-16 NOTE — Progress Notes (Signed)
Pre visit review using our clinic review tool, if applicable. No additional management support is needed unless otherwise documented below in the visit note. 

## 2013-08-18 ENCOUNTER — Telehealth: Payer: Self-pay | Admitting: Internal Medicine

## 2013-08-18 DIAGNOSIS — H9202 Otalgia, left ear: Secondary | ICD-10-CM

## 2013-08-18 DIAGNOSIS — R42 Dizziness and giddiness: Secondary | ICD-10-CM

## 2013-08-18 NOTE — Telephone Encounter (Signed)
Pt saw dr Maudie Mercury on 08-16-13 and would like a referral to ENT for ear discomfort. Pt will go to ENT in [place] not on [address]. Pt will go to ENT in [place] if neccessary

## 2013-08-18 NOTE — Telephone Encounter (Signed)
Attempted to return call to patient concerning dizziness, ear pain. Unable to reach. Message left to call back.

## 2013-08-20 DIAGNOSIS — M26629 Arthralgia of temporomandibular joint, unspecified side: Secondary | ICD-10-CM | POA: Insufficient documentation

## 2013-08-20 DIAGNOSIS — M2550 Pain in unspecified joint: Secondary | ICD-10-CM | POA: Insufficient documentation

## 2013-08-20 NOTE — Assessment & Plan Note (Signed)
She may be describing a minimal asthma/bronchitis

## 2013-08-20 NOTE — Assessment & Plan Note (Signed)
Plan-nasal nebulizer decongestant, educated pollen allergy

## 2013-08-20 NOTE — Assessment & Plan Note (Signed)
Has been clenching teeth under stress Plan- NSAID,  Bruxism guard, see her dentist

## 2013-08-21 NOTE — Telephone Encounter (Signed)
Ok per Dr Jenkins, referral order placed 

## 2013-08-21 NOTE — Telephone Encounter (Signed)
duplicate

## 2013-08-29 ENCOUNTER — Ambulatory Visit: Payer: 59 | Admitting: Internal Medicine

## 2013-12-15 ENCOUNTER — Other Ambulatory Visit: Payer: Self-pay | Admitting: Obstetrics and Gynecology

## 2013-12-16 ENCOUNTER — Other Ambulatory Visit (HOSPITAL_COMMUNITY): Payer: 59

## 2013-12-17 NOTE — H&P (Signed)
NAMELOUANNE, VILLANOVA NO.:  [id number]  MEDICAL RECORD NO.:  [id number]  LOCATION:  [facility]                         FACILITY:  [facility]  PHYSICIAN:  Lovenia Kim, M.D.DATE OF BIRTH:  [birth year]  DATE OF ADMISSION:  12/18/2013 DATE OF DISCHARGE:                             HISTORY & PHYSICAL   CHIEF COMPLAINT:  Recurrent right Bartholin abscess.  HISTORY OF PRESENT ILLNESS:  A 48 year old white female, G2, P2, who presents with recurrent right Bartholin abscess status post incision and drainage on December 12, 2013, with copious pus- like drainage. Now presents for definitive therapy due to recurrence.  ALLERGIES:  LATEX, SULFA, CODEINE, ASPIRIN, CIPRO, AND PENICILLIN.  CURRENT MEDICATIONS:Percocet prn  SOCIAL HISTORY:  She is a nonsmoker, nondrinker.    SURGICAL HISTORY:  Remarkable for cesarean delivery, D and C, and NovaSure ablation.  PHYSICAL EXAMINATION:  GENERAL:  Well-developed, well-nourished, white female, in no acute distress. HEENT:  Normal. NECK:  Supple.  Full range of motion. LUNGS:  Clear. HEART:  Regular rate and rhythm. ABDOMEN:  Soft, gravid, nontender. PELVIC:  Pelvic exam reveals a bulging tender area along the right posterior labial area consistent with recurrent Bartholin abscess. EXTREMITIES:  There are no cords. NEUROLOGIC:  Nonfocal. SKIN:  Intact.  IMPRESSION:  Recurrent Bartholin duct abscess.  PLAN:  Proceed with marsupialization of Bartholin duct abscess.  Risks of anesthesia, infection, bleeding, possible injury to surrounding organs with need for repair discussed. Possible recurrence, need for future surgery discussed with the patient and she wished to proceed.     Lovenia Kim, M.D.     RJT/MEDQ  D:  12/17/2013  T:  12/17/2013  Job:  [id number]

## 2013-12-18 ENCOUNTER — Ambulatory Visit (HOSPITAL_COMMUNITY): Payer: 59 | Admitting: Anesthesiology

## 2013-12-18 ENCOUNTER — Encounter (HOSPITAL_COMMUNITY): Payer: 59 | Admitting: Anesthesiology

## 2013-12-18 ENCOUNTER — Ambulatory Visit (HOSPITAL_COMMUNITY)
Admission: AD | Admit: 2013-12-18 | Discharge: 2013-12-18 | Disposition: A | Discharge status: Home / Self Care | Payer: 59 | Source: Ambulatory Visit | Attending: Obstetrics and Gynecology | Admitting: Obstetrics and Gynecology

## 2013-12-18 ENCOUNTER — Encounter (HOSPITAL_COMMUNITY): Payer: Self-pay | Admitting: Anesthesiology

## 2013-12-18 ENCOUNTER — Encounter (HOSPITAL_COMMUNITY)
Admission: AD | Disposition: A | Discharge status: Home / Self Care | Payer: Self-pay | Source: Ambulatory Visit | Attending: Obstetrics and Gynecology

## 2013-12-18 DIAGNOSIS — K589 Irritable bowel syndrome without diarrhea: Secondary | ICD-10-CM | POA: Diagnosis not present

## 2013-12-18 DIAGNOSIS — N751 Abscess of Bartholin's gland: Secondary | ICD-10-CM

## 2013-12-18 DIAGNOSIS — K219 Gastro-esophageal reflux disease without esophagitis: Secondary | ICD-10-CM | POA: Diagnosis not present

## 2013-12-18 DIAGNOSIS — R011 Cardiac murmur, unspecified: Secondary | ICD-10-CM | POA: Diagnosis not present

## 2013-12-18 DIAGNOSIS — J45909 Unspecified asthma, uncomplicated: Secondary | ICD-10-CM | POA: Insufficient documentation

## 2013-12-18 DIAGNOSIS — K5732 Diverticulitis of large intestine without perforation or abscess without bleeding: Secondary | ICD-10-CM | POA: Insufficient documentation

## 2013-12-18 DIAGNOSIS — I059 Rheumatic mitral valve disease, unspecified: Secondary | ICD-10-CM | POA: Insufficient documentation

## 2013-12-18 HISTORY — PX: BARTHOLIN CYST MARSUPIALIZATION: SHX5383

## 2013-12-18 LAB — CBC
HCT: 39.8 % (ref 36.0–46.0)
Hemoglobin: 14.3 g/dL (ref 12.0–15.0)
MCH: 34.4 pg — AB (ref 26.0–34.0)
MCHC: 35.9 g/dL (ref 30.0–36.0)
MCV: 95.7 fL (ref 78.0–100.0)
Platelets: 346 10*3/uL (ref 150–400)
RBC: 4.16 MIL/uL (ref 3.87–5.11)
RDW: 11.5 % (ref 11.5–15.5)
WBC: 9.4 10*3/uL (ref 4.0–10.5)

## 2013-12-18 SURGERY — MARSUPIALIZATION, CYST, BARTHOLIN'S GLAND
Anesthesia: General | Site: Perineum

## 2013-12-18 MED ORDER — ACETAMINOPHEN 160 MG/5ML PO SOLN: 325.0000 mg | ORAL | Status: DC | PRN

## 2013-12-18 MED ORDER — MIDAZOLAM HCL 2 MG/2ML IJ SOLN
INTRAMUSCULAR | Status: DC | PRN
  Administered 2013-12-18: 2 mg via INTRAVENOUS

## 2013-12-18 MED ORDER — PROPOFOL 10 MG/ML IV EMUL
INTRAVENOUS | Status: AC
  Filled 2013-12-18: qty 20

## 2013-12-18 MED ORDER — FENTANYL CITRATE 0.05 MG/ML IJ SOLN: 25.0000 ug | INTRAMUSCULAR | Status: DC | PRN

## 2013-12-18 MED ORDER — ONDANSETRON HCL 4 MG/2ML IJ SOLN
INTRAMUSCULAR | Status: AC
  Filled 2013-12-18: qty 2

## 2013-12-18 MED ORDER — MIDAZOLAM HCL 2 MG/2ML IJ SOLN
INTRAMUSCULAR | Status: AC
  Filled 2013-12-18: qty 2

## 2013-12-18 MED ORDER — FENTANYL CITRATE 0.05 MG/ML IJ SOLN
INTRAMUSCULAR | Status: DC | PRN
  Administered 2013-12-18 (×2): 25 ug via INTRAVENOUS
  Administered 2013-12-18: 50 ug via INTRAVENOUS
  Administered 2013-12-18 (×2): 25 ug via INTRAVENOUS
  Administered 2013-12-18: 50 ug via INTRAVENOUS

## 2013-12-18 MED ORDER — PROPOFOL 10 MG/ML IV BOLUS
INTRAVENOUS | Status: DC | PRN
  Administered 2013-12-18: 110 mg via INTRAVENOUS

## 2013-12-18 MED ORDER — FENTANYL CITRATE 0.05 MG/ML IJ SOLN
INTRAMUSCULAR | Status: AC
  Filled 2013-12-18: qty 2

## 2013-12-18 MED ORDER — 0.9 % SODIUM CHLORIDE (POUR BTL) OPTIME
TOPICAL | Status: DC | PRN
  Administered 2013-12-18: 1000 mL

## 2013-12-18 MED ORDER — DEXAMETHASONE SODIUM PHOSPHATE 10 MG/ML IJ SOLN
INTRAMUSCULAR | Status: DC | PRN
  Administered 2013-12-18: 5 mg via INTRAVENOUS

## 2013-12-18 MED ORDER — LACTATED RINGERS IV SOLN
INTRAVENOUS | Status: DC
  Administered 2013-12-18 (×4): via INTRAVENOUS

## 2013-12-18 MED ORDER — METOCLOPRAMIDE HCL 5 MG/ML IJ SOLN
10.0000 mg | Freq: Once | INTRAMUSCULAR | Status: AC | PRN
  Administered 2013-12-18: 10 mg via INTRAVENOUS

## 2013-12-18 MED ORDER — LIDOCAINE HCL (CARDIAC) 20 MG/ML IV SOLN
INTRAVENOUS | Status: DC | PRN
  Administered 2013-12-18: 60 mg via INTRAVENOUS

## 2013-12-18 MED ORDER — BUPIVACAINE HCL (PF) 0.5 % IJ SOLN
INTRAMUSCULAR | Status: DC | PRN
  Administered 2013-12-18: 20 mL

## 2013-12-18 MED ORDER — ACETAMINOPHEN 325 MG PO TABS: 325.0000 mg | ORAL_TABLET | ORAL | Status: DC | PRN

## 2013-12-18 MED ORDER — OXYCODONE-ACETAMINOPHEN 5-325 MG PO TABS: 1.0000 | ORAL_TABLET | ORAL | Status: DC | PRN

## 2013-12-18 MED ORDER — DEXAMETHASONE SODIUM PHOSPHATE 10 MG/ML IJ SOLN
INTRAMUSCULAR | Status: AC
Start: 2013-12-18 — End: 2013-12-18
  Filled 2013-12-18: qty 1

## 2013-12-18 MED ORDER — METOCLOPRAMIDE HCL 5 MG/ML IJ SOLN
INTRAMUSCULAR | Status: AC
  Administered 2013-12-18: 10 mg via INTRAVENOUS
  Filled 2013-12-18: qty 2

## 2013-12-18 MED ORDER — LIDOCAINE HCL (CARDIAC) 20 MG/ML IV SOLN
INTRAVENOUS | Status: AC
  Filled 2013-12-18: qty 5

## 2013-12-18 MED ORDER — CEFAZOLIN SODIUM-DEXTROSE 2-3 GM-% IV SOLR
2.0000 g | INTRAVENOUS | Status: AC
  Administered 2013-12-18: 2 g via INTRAVENOUS

## 2013-12-18 MED ORDER — CEFAZOLIN SODIUM-DEXTROSE 2-3 GM-% IV SOLR
INTRAVENOUS | Status: AC
  Filled 2013-12-18: qty 50

## 2013-12-18 MED ORDER — ONDANSETRON HCL 4 MG/2ML IJ SOLN
INTRAMUSCULAR | Status: DC | PRN
  Administered 2013-12-18: 4 mg via INTRAVENOUS

## 2013-12-18 MED ORDER — BUPIVACAINE HCL (PF) 0.5 % IJ SOLN
INTRAMUSCULAR | Status: AC
  Filled 2013-12-18: qty 30

## 2013-12-18 MED ORDER — MEPERIDINE HCL 25 MG/ML IJ SOLN: 6.2500 mg | INTRAMUSCULAR | Status: DC | PRN

## 2013-12-18 MED ORDER — LIDOCAINE HCL 1 % IJ SOLN
INTRAMUSCULAR | Status: AC
  Filled 2013-12-18: qty 20

## 2013-12-18 MED ORDER — DOXYCYCLINE HYCLATE 50 MG PO CAPS: 100.0000 mg | ORAL_CAPSULE | Freq: Two times a day (BID) | ORAL | Status: DC

## 2013-12-18 SURGICAL SUPPLY — 24 items
BLADE 15 SAFETY STRL DISP (BLADE) ×2 IMPLANT
CLOTH BEACON ORANGE TIMEOUT ST (SAFETY) ×2 IMPLANT
COUNTER NEEDLE 1200 MAGNETIC (NEEDLE) ×1 IMPLANT
ELECT NDL TIP 2.8 STRL (NEEDLE) IMPLANT
ELECT NEEDLE TIP 2.8 STRL (NEEDLE) ×2 IMPLANT
ELECT REM PT RETURN 9FT ADLT (ELECTROSURGICAL) ×2
ELECTRODE REM PT RTRN 9FT ADLT (ELECTROSURGICAL) ×1 IMPLANT
GAUZE PACKING 1/2X5YD (GAUZE/BANDAGES/DRESSINGS) IMPLANT
GLOVE BIO SURGEON STRL SZ7.5 (GLOVE) ×3 IMPLANT
GOWN STRL REUS W/TWL LRG LVL3 (GOWN DISPOSABLE) ×4 IMPLANT
NS IRRIG 1000ML POUR BTL (IV SOLUTION) ×2 IMPLANT
PACK VAGINAL MINOR WOMEN LF (CUSTOM PROCEDURE TRAY) ×2 IMPLANT
PAD OB MATERNITY 4.3X12.25 (PERSONAL CARE ITEMS) ×2 IMPLANT
PAD PREP 24X48 CUFFED NSTRL (MISCELLANEOUS) ×2 IMPLANT
PENCIL BUTTON HOLSTER BLD 10FT (ELECTRODE) ×2 IMPLANT
SUT VICRYL 3 0 RAPIDE (SUTURE) ×6 IMPLANT
SUT VICRYL RAPIDE 2 0 (SUTURE) ×2 IMPLANT
SWAB COLLECTION DEVICE MRSA (MISCELLANEOUS) IMPLANT
SYR BULB IRRIGATION 50ML (SYRINGE) ×1 IMPLANT
TOWEL OR 17X24 6PK STRL BLUE (TOWEL DISPOSABLE) ×4 IMPLANT
TUBE ANAEROBIC SPECIMEN COL (MISCELLANEOUS) IMPLANT
TUBING CONNECTOR 18X5MM (MISCELLANEOUS) ×1 IMPLANT
WATER STERILE IRR 1000ML POUR (IV SOLUTION) ×1 IMPLANT
YANKAUER SUCT BULB TIP NO VENT (SUCTIONS) ×1 IMPLANT

## 2013-12-18 NOTE — Transfer of Care (Signed)
Immediate Anesthesia Transfer of Care Note  Patient: [name]  Procedure(s) Performed: Procedure(s): BARTHOLIN CYST MARSUPIALIZATION WITH BIOPSY (N/A)  Patient Location: PACU  Anesthesia Type:General  Level of Consciousness: awake, alert , oriented and patient cooperative  Airway & Oxygen Therapy: Patient Spontanous Breathing and Patient connected to nasal cannula oxygen  Post-op Assessment: Report given to PACU RN and Post -op Vital signs reviewed and stable  Post vital signs: Reviewed and stable  Complications: No apparent anesthesia complications

## 2013-12-18 NOTE — Anesthesia Postprocedure Evaluation (Signed)
  Anesthesia Post-op Note  Patient: [name]  Procedure(s) Performed: Procedure(s): BARTHOLIN CYST MARSUPIALIZATION WITH BIOPSY (N/A) Patient is awake and responsive. Pain and nausea are reasonably well controlled. Vital signs are stable and clinically acceptable. Oxygen saturation is clinically acceptable. There are no apparent anesthetic complications at this time. Patient is ready for discharge.

## 2013-12-18 NOTE — Op Note (Signed)
[name], DELGAUDIO NO.:  [id number]  MEDICAL RECORD NO.:  [id number]  LOCATION:  [facility]                          FACILITY:  [facility]  PHYSICIAN:  Lovenia Kim, M.D.DATE OF BIRTH:  Aug 19, 1965  DATE OF PROCEDURE: DATE OF DISCHARGE:                              OPERATIVE REPORT   PREOPERATIVE DIAGNOSIS:  Symptomatic recurrent refractory right Bartholin's duct abscess.  POSTOPERATIVE DIAGNOSIS:  Symptomatic recurrent refractory right Bartholin's duct abscess.  PROCEDURE:  Marsupialization of right Bartholin's duct abscess with biopsy of right Bartholin's gland.  SURGEON:  Lovenia Kim, M.D.  ASSISTANT:  None.  ANESTHESIA:  Local and general.  ESTIMATED BLOOD LOSS:  Less than 50 mL.  COMPLICATIONS:  None.  DRAINS:  None.  COUNTS:  Correct.  DISPOSITION:  The patient to recovery in good condition.  BRIEF OPERATIVE NOTE:  After being apprised of the risks of anesthesia, infection, bleeding, injury to surrounding organs, possible need for repair, delayed versus immediate complications to include bowel and bladder injury, possible need for repair, the patient was brought to the operating room where she was administered general anesthetic without complications, prepped and draped in usual sterile fashion, catheterized until the bladder was empty.  At this time, the mass on the right is recurrent consistent with previously drained right Bartholin's duct abscess.  This was palpated as noted previously the area was.  Then, just inside the hymenal tags on the right, an elliptical 1 cm piece of skin incision was excised and removed.  The cyst wall was then penetrated revealing copious amount of pustular malodorous discharge. This was then irrigated copiously with saline solution.  At this time, a biopsy of the cyst and the cyst wall are accomplished without difficulty using sharp dissection.  The Bartholin's duct abscess was then marsupialized using a  2-0 Vicryl Rapide suture in a standard interrupted fashion everting the epithelium of the cystic duct in a standard fashion with multiple interrupted sutures were placed.  Irrigation was then accomplished.  A dilute Marcaine solution was placed.  Good hemostasis was noted.  The base of the cyst wall and cavity are cauterized using electrocautery.  At this time, the patient tolerated the procedure well, was awakened, and transferred to recovery in good condition.     Lovenia Kim, M.D.     RJT/MEDQ  D:  12/18/2013  T:  12/18/2013  Job:  [id number]

## 2013-12-18 NOTE — Discharge Instructions (Signed)
DISCHARGE INSTRUCTIONS: Bartholin's Cyst Marsupilization The following instructions have been prepared to help you care for yourself upon your return home.   Personal hygiene:  Use sanitary pads for vaginal drainage, not tampons.  Shower the day after your procedure.  NO tub baths, pools or Jacuzzis for 2-3 weeks.  Wipe front to back after using the bathroom.  Activity and limitations:  Do NOT drive or operate any equipment for 24 hours. The effects of anesthesia are still present and drowsiness may result.  Do NOT rest in bed all day.  Walking is encouraged.  Walk up and down stairs slowly.  You may resume your normal activity in one to two days or as indicated by your physician.  Sexual activity: NO intercourse for at least 2 weeks after the procedure, or as indicated by your physician.  Diet: Eat a light meal as desired this evening. You may resume your usual diet tomorrow.  Return to work: You may resume your work activities in one to two days or as indicated by your doctor.  What to expect after your surgery: Expect to have vaginal bleeding/discharge for 2-3 days and spotting for up to 10 days. It is not unusual to have soreness for up to 1-2 weeks. You may have a slight burning sensation when you urinate for the first day. Mild cramps may continue for a couple of days. You may have a regular period in 2-6 weeks.  Call your doctor for any of the following:  Excessive vaginal bleeding, saturating and changing one pad every hour.  Inability to urinate 6 hours after discharge from hospital.  Pain not relieved by pain medication.  Fever of 100.4 F or greater.  Unusual vaginal discharge or odor.   Call for an appointment:    Patients signature: ______________________  Nurses signature ________________________  Support person's signature_______________________

## 2013-12-18 NOTE — Op Note (Signed)
12/18/2013  11:55 AM  PATIENT:  [name]  48 y.o. female  PRE-OPERATIVE DIAGNOSIS:  Right Bartholins Cyst Abcess  POST-OPERATIVE DIAGNOSIS:  Right Bartholins Cyst Abcess  PROCEDURE:  Procedure(s): BARTHOLIN CYST MARSUPIALIZATION WITH BIOPSY  SURGEON:  Surgeon(s): Lovenia Kim, MD  ASSISTANTS: none   ANESTHESIA:   local and general  ESTIMATED BLOOD LOSS: less than 50cc  DRAINS: none   LOCAL MEDICATIONS USED:  MARCAINE    and Amount: 20 ml  SPECIMEN:  Source of Specimen:  right bartholins gland  DISPOSITION OF SPECIMEN:  PATHOLOGY  COUNTS:  YES  DICTATION #[id number]  PLAN OF CARE: dc home  PATIENT DISPOSITION:  PACU - hemodynamically stable.

## 2013-12-18 NOTE — Anesthesia Preprocedure Evaluation (Addendum)
Anesthesia Evaluation  Patient identified by MRN, date of birth, ID band Patient awake    Reviewed: Allergy & Precautions, H&P , NPO status , Patient's Chart, lab work & pertinent test results, reviewed documented beta blocker date and time   History of Anesthesia Complications (+) PONV and history of anesthetic complications (pt feels she had difficulty waking up last time she had anesthesia)  Airway Mallampati: II TM Distance: >3 FB Neck ROM: full   Comment: +TMJ Dental  (+) Teeth Intact   Pulmonary asthma (allergy-related, does not use inhaler) ,  breath sounds clear to auscultation  Pulmonary exam normal       Cardiovascular Exercise Tolerance: Good + Valvular Problems/Murmurs MVP Rhythm:regular Rate:Normal     Neuro/Psych negative neurological ROS  negative psych ROS   GI/Hepatic Neg liver ROS, GERD- (no medication recently, complains of increased problem with this recently)  ,IBS diverticulitis   Endo/Other  negative endocrine ROS  Renal/GU negative Renal ROS Bladder dysfunction (interstitial cystitis) Female GU complaint (bartholin's abscess)     Musculoskeletal   Abdominal   Peds  Hematology negative hematology ROS (+)   Anesthesia Other Findings   Reproductive/Obstetrics negative OB ROS                          Anesthesia Physical Anesthesia Plan  ASA: II  Anesthesia Plan: General LMA   Post-op Pain Management:    Induction:   Airway Management Planned:   Additional Equipment:   Intra-op Plan:   Post-operative Plan:   Informed Consent: I have reviewed the patients History and Physical, chart, labs and discussed the procedure including the risks, benefits and alternatives for the proposed anesthesia with the patient or authorized representative who has indicated his/her understanding and acceptance.   Dental Advisory Given  Plan Discussed with: CRNA and  Surgeon  Anesthesia Plan Comments:         Anesthesia Quick Evaluation

## 2013-12-18 NOTE — Progress Notes (Signed)
Patient ID: [name], female   DOB: [birth year], 48 y.o.   MRN: [id number] Patient seen and examined. Consent witnessed and signed. No changes noted. Update completed.

## 2013-12-19 ENCOUNTER — Ambulatory Visit: Payer: 59 | Admitting: Family Medicine

## 2013-12-19 ENCOUNTER — Encounter (HOSPITAL_COMMUNITY): Payer: Self-pay | Admitting: Obstetrics and Gynecology

## 2014-01-02 ENCOUNTER — Telehealth: Payer: Self-pay | Admitting: Internal Medicine

## 2014-01-02 MED ORDER — AZELASTINE HCL 0.15 % NA SOLN: NASAL | Status: DC

## 2014-01-02 MED ORDER — AZELASTINE HCL 0.1 % NA SOLN: NASAL | Status: DC

## 2014-01-02 NOTE — Telephone Encounter (Signed)
Pt returned call- [phone]

## 2014-01-02 NOTE — Telephone Encounter (Signed)
Pt c/o sinus pressure/congestion, cough and PND. Pt states that she has also had some chest congestion since an outpatient procedure for Right Bartholins Cyst Abcess Biopsy Pt requesting an OV with Dr Annamaria Boots ONLY.  Please advise if patient can be seen today. Thanks.

## 2014-01-02 NOTE — Telephone Encounter (Signed)
Sorry, no openings today for appt; patient can see TP today or come see CY on Wednesday 01-03-14 at 11:30am slot; be here by 11:15am to check in. Thanks.

## 2014-01-02 NOTE — Telephone Encounter (Signed)
Called spoke with [facility]. She reports pt insurance will not cover astepro .15% but it will cover astelin .1% Please advise if okay to call in with same directions? thanks

## 2014-01-02 NOTE — Telephone Encounter (Signed)
Ok to change script and med list to Astelin as requested, Thanks

## 2014-01-02 NOTE — Telephone Encounter (Signed)
Ok to order Astepro nasal spray, # 1, 1-2 puffs, once or twice daily as needed, refill prn

## 2014-01-02 NOTE — Telephone Encounter (Signed)
lmomtcb x1 

## 2014-01-02 NOTE — Telephone Encounter (Signed)
Called pt. appt scheduled to see CDY tomorrow. Nothing further needed

## 2014-01-02 NOTE — Telephone Encounter (Signed)
RX has been called in w/ new strength. Nothing further needed

## 2014-01-02 NOTE — Telephone Encounter (Signed)
Called spoke with pt. She is not able to come in for appt to see CDY tomorrow. She is asking if we can call in astepro instead for her runny nose. Please advise dr. Annamaria Boots thanks  Allergies  Allergen Reactions  . Aspirin Shortness Of Breath    "irritated stomach" Ibuprofen and Naproxen do not agree with her either  . Latex Shortness Of Breath and Itching  . Other Shortness Of Breath    peanuts  . Sulfamethoxazole-Trimethoprim Shortness Of Breath  . Sulfonamide Derivatives Shortness Of Breath and Itching  . Sulphadimidine Sodium [Sulfamethazine Sodium] Shortness Of Breath    Added to food for preservative  . Amoxicillin     Doesn't remember rxn  . Carisoprodol   . Ciprofloxacin     REACTION: SOB, "Tightness in head"  . Clarithromycin     "Doesn't agree with me"  . Codeine     Does not take percocet or hydrocodone; tylenol only  . Epinephrine     Heart racing  . Metronidazole Other (See Comments)    "cannot tolerate"  . Penicillins     Doesn't remember reaction     Current Outpatient Prescriptions on File Prior to Visit  Medication Sig Dispense Refill  . acetaminophen (TYLENOL) 500 MG tablet Take 500 mg by mouth every 8 (eight) hours as needed for headache.      . doxycycline (DORYX) 100 MG EC tablet Take 100 mg by mouth 2 (two) times daily.      Marland Kitchen doxycycline (VIBRAMYCIN) 50 MG capsule Take 2 capsules (100 mg total) by mouth 2 (two) times daily.  8 capsule  0  . oxyCODONE-acetaminophen (ROXICET) 5-325 MG per tablet Take 1-2 tablets by mouth every 4 (four) hours as needed for severe pain.  30 tablet  0   No current facility-administered medications on file prior to visit.

## 2014-01-02 NOTE — Telephone Encounter (Signed)
Pt aware of recs. RX sent in. Nothing further needed 

## 2014-01-03 ENCOUNTER — Ambulatory Visit: Payer: 59 | Admitting: Internal Medicine

## 2014-01-03 ENCOUNTER — Ambulatory Visit (INDEPENDENT_AMBULATORY_CARE_PROVIDER_SITE_OTHER): Payer: 59

## 2014-01-03 DIAGNOSIS — Z23 Encounter for immunization: Secondary | ICD-10-CM

## 2014-03-06 ENCOUNTER — Telehealth: Payer: Self-pay | Admitting: Internal Medicine

## 2014-03-06 NOTE — Telephone Encounter (Signed)
Left message for pt to call back.  Pt states that for the past 2 weeks she has been having mucous in her stools. Now reports she is having pain on the left side of her stomach. Pt scheduled to see Lori Hvozdovic, PA-C 03/08/14@2 :45pm. Pt aware of appt.

## 2014-03-08 ENCOUNTER — Ambulatory Visit (INDEPENDENT_AMBULATORY_CARE_PROVIDER_SITE_OTHER): Payer: 59 | Admitting: Physician Assistant

## 2014-03-08 ENCOUNTER — Encounter: Payer: Self-pay | Admitting: Physician Assistant

## 2014-03-08 VITALS — BP 102/76 | HR 68 | Ht 59.0 in | Wt 101.4 lb

## 2014-03-08 DIAGNOSIS — K649 Unspecified hemorrhoids: Secondary | ICD-10-CM

## 2014-03-08 DIAGNOSIS — K219 Gastro-esophageal reflux disease without esophagitis: Secondary | ICD-10-CM

## 2014-03-08 DIAGNOSIS — K222 Esophageal obstruction: Secondary | ICD-10-CM

## 2014-03-08 DIAGNOSIS — K589 Irritable bowel syndrome without diarrhea: Secondary | ICD-10-CM

## 2014-03-08 MED ORDER — HYOSCYAMINE SULFATE 0.125 MG SL SUBL: 0.1250 mg | SUBLINGUAL_TABLET | SUBLINGUAL | Status: DC | PRN

## 2014-03-08 MED ORDER — HYOSCYAMINE SULFATE 0.125 MG SL SUBL: 0.1250 mg | SUBLINGUAL_TABLET | Freq: Two times a day (BID) | SUBLINGUAL | Status: DC | PRN

## 2014-03-08 MED ORDER — HYDROCORTISONE ACETATE 25 MG RE SUPP: 25.0000 mg | Freq: Two times a day (BID) | RECTAL | Status: DC

## 2014-03-08 MED ORDER — HYDROCORTISONE ACETATE 25 MG RE SUPP: 25.0000 mg | Freq: Every day | RECTAL | Status: DC

## 2014-03-08 MED ORDER — OMEPRAZOLE 20 MG PO CPDR: 20.0000 mg | DELAYED_RELEASE_CAPSULE | Freq: Every day | ORAL | Status: DC

## 2014-03-08 NOTE — Patient Instructions (Signed)
You have been scheduled for a Barium Esophogram at [facility] (1st floor of the hospital) on 03/12/14 at 10 am. Please arrive 15 minutes prior to your appointment for registration. Make certain not to have anything to eat or drink 6 hours prior to your test. If you need to reschedule for any reason, please contact radiology at [phone] to do so. __________________________________________________________________ A barium swallow is an examination that concentrates on views of the esophagus. This tends to be a double contrast exam (barium and two liquids which, when combined, create a gas to distend the wall of the oesophagus) or single contrast (non-ionic iodine based). The study is usually tailored to your symptoms so a good history is essential. Attention is paid during the study to the form, structure and configuration of the esophagus, looking for functional disorders (such as aspiration, dysphagia, achalasia, motility and reflux) EXAMINATION You may be asked to change into a gown, depending on the type of swallow being performed. A radiologist and radiographer will perform the procedure. The radiologist will advise you of the type of contrast selected for your procedure and direct you during the exam. You will be asked to stand, sit or lie in several different positions and to hold a small amount of fluid in your mouth before being asked to swallow while the imaging is performed .In some instances you may be asked to swallow barium coated marshmallows to assess the motility of a solid food bolus. The exam can be recorded as a digital or video fluoroscopy procedure. POST PROCEDURE It will take 1-2 days for the barium to pass through your system. To facilitate this, it is important, unless otherwise directed, to increase your fluids for the next 24-48hrs and to resume your normal diet.  This test typically takes about 30 minutes to  perform. __________________________________________________________________________________ We have sent medications to your pharmacy for you to pick up at your convenience. We have given you anti reflux measures to follow.

## 2014-03-08 NOTE — Progress Notes (Signed)
Patient ID: [name], female   DOB: March 18, 1966, 48 y.o.   MRN: [id number]     History of Present Illness: [name] is a delightful 48 year old female who has been seen here in the past by Dr.Perry for multiple GI complaints. She has a history of GERD, IBS, and multiple somatic complaints. She had a colonoscopy in 2013 that had a normal terminal ileum, normal colon, no polyps or cancers, small internal hemorrhoids.  He is here today because for the past 2 weeks she has been waking up in the morning with lower abdominal cramping and then has a soft bowel movement with mucus. She has no bright red blood per rectum and no melena. Her left lower quadrant pain is relieved when she has a bowel movement. She has also been having heartburn. She was on a PPI years ago but then was feeling better and discontinued it. She has been getting heartburn on a daily basis and more recently feels a tightness in her throat. She does not feel food gets stuck but she feels sometimes it passes through her throat slowly. She has been trying to drink peppermint tea to decrease her lower abdominal discomfort but notes her heartburn has been worse whenever she drinks the peppermint tea. She has been having nausea which is worse on an empty stomach and alleviated with ingestion of food she has not vomited she has no belching or burping she does have a sensation of incomplete evacuation with bowel movements but has not had any anal seepage.   Past Medical History  Diagnosis Date  . Mitral valve disorders   . Contact dermatitis and other eczema, due to unspecified cause   . Esophageal reflux   . Lumbago   . IBS (irritable bowel syndrome)   . Anxiety   . Anemia   . IC (interstitial cystitis)   . Internal hemorrhoids   . Diverticulitis     CT Scan  . Asthma     allergy related    Past Surgical History  Procedure Laterality Date  . Cesarean section    . Sinus surgery with instatrak    . Mouth surgery    .  Dilitation & currettage/hystroscopy with novasure ablation N/A 10/14/2012    Procedure: DILATATION & CURETTAGE/HYSTEROSCOPY WITH NOVASURE ABLATION;  Surgeon: Lovenia Kim, MD;  Location: [facility];  Service: Gynecology;  Laterality: N/A;  . Bartholin cyst marsupialization N/A 12/18/2013    Procedure: BARTHOLIN CYST MARSUPIALIZATION WITH BIOPSY;  Surgeon: Lovenia Kim, MD;  Location: [facility];  Service: Gynecology;  Laterality: N/A;   Family History  Problem Relation Age of Onset  . Colon polyps Sister   . Diabetes Mother   . Hypertension Mother   . Hypertension Father   . Hyperlipidemia Father   . Alcohol abuse      Family history  . Colon cancer Neg Hx   . Heart attack Father    History  Substance Use Topics  . Smoking status: Never Smoker   . Smokeless tobacco: Never Used  . Alcohol Use: No   Current Outpatient Prescriptions  Medication Sig Dispense Refill  . acetaminophen (TYLENOL) 500 MG tablet Take 500 mg by mouth every 8 (eight) hours as needed for headache.     No current facility-administered medications for this visit.   Allergies  Allergen Reactions  . Aspirin Shortness Of Breath    "irritated stomach" Ibuprofen and Naproxen do not agree with her either  . Latex Shortness Of Breath and Itching  .  Other Shortness Of Breath    peanuts  . Sulfamethoxazole-Trimethoprim Shortness Of Breath  . Sulfonamide Derivatives Shortness Of Breath and Itching  . Sulphadimidine Sodium [Sulfamethazine Sodium] Shortness Of Breath    Added to food for preservative  . Amoxicillin     Doesn't remember rxn  . Carisoprodol   . Ciprofloxacin     REACTION: SOB, "Tightness in head"  . Clarithromycin     "Doesn't agree with me"  . Codeine     Does not take percocet or hydrocodone; tylenol only  . Epinephrine     Heart racing  . Metronidazole Other (See Comments)    "cannot tolerate"  . Penicillins     Doesn't remember reaction      Review of Systems: Gen: Denies any fever,  chills, sweats, anorexia, fatigue, weakness, malaise, weight loss, and sleep disorder CV: Denies chest pain, angina, palpitations, syncope, orthopnea, PND, peripheral edema, and claudication. Resp: Denies dyspnea at rest, dyspnea with exercise, cough, sputum, wheezing, coughing up blood, and pleurisy. GI: Denies vomiting blood, jaundice, and fecal incontinence.   Denies dysphagia or odynophagia. GU : Denies urinary burning, blood in urine, urinary frequency, urinary hesitancy, nocturnal urination, and urinary incontinence. MS: Denies joint pain, limitation of movement, and swelling, stiffness, low back pain, extremity pain. Denies muscle weakness, cramps, atrophy.  Derm: Denies rash, itching, dry skin, hives, moles, warts, or unhealing ulcers.  Psych: Denies depression, anxiety, memory loss, suicidal ideation, hallucinations, paranoia, and confusion. Heme: Denies bruising, bleeding, and enlarged lymph nodes. Neuro:  Denies any headaches, dizziness, paresthesia Endo:  Denies any problems with DM, thyroid, adrenal     Physical Exam: General: Pleasant, well developed , female in no acute distress Head: Normocephalic and atraumatic Eyes:  sclerae anicteric, conjunctiva pink  Ears: Normal auditory acuity Lungs: Clear throughout to auscultation Heart: Regular rate and rhythm Abdomen: Soft, non distended, non-tender. No masses, no hepatomegaly. Normal bowel sounds Rectal:small ext hemorrhoid, brown stool tesst heme negative Musculoskeletal: Symmetrical with no gross deformities  Extremities: No edema  Neurological: Alert oriented x 4, grossly nonfocal Psychological:  Alert and cooperative. Normal mood and affect  Assessment and Recommendations: #1 GERD and dysphagia. An antireflux regimen has been reviewed. She will be given a trial of omeprazole 20 mg by mouth every morning 30 minutes prior to breakfast she will be scheduled for an esophagram with barium pill to evaluate for stricture further  recommendations will be made pending the findings of her esophagram.  #2 irritable bowel syndrome. This is likely the etiology of her lower abdominal cramping that is relieved with defecation she will be given a trial of Levsin 0.125 mg twice a day when necessary.  #3. Hemorrhoids. She will be given a trial of Anusol HC one per rectum daily at bedtime 7 days. She's been advised to keep her stools soft and to use Tucks wipes as needed.  She will follow-up in 2-1/2-3 months, sooner if needed     Raequon Catanzaro, Deloris Ping 03/08/2014,

## 2014-03-09 ENCOUNTER — Telehealth: Payer: Self-pay | Admitting: Physician Assistant

## 2014-03-09 NOTE — Telephone Encounter (Signed)
Called pharmacy back. Verified scripts Hyoscyamine and Anusol

## 2014-03-12 ENCOUNTER — Ambulatory Visit (HOSPITAL_COMMUNITY)
Admission: RE | Admit: 2014-03-12 | Discharge: 2014-03-12 | Disposition: A | Discharge status: Home / Self Care | Payer: 59 | Source: Ambulatory Visit | Attending: Physician Assistant | Admitting: Physician Assistant

## 2014-03-12 DIAGNOSIS — R1012 Left upper quadrant pain: Secondary | ICD-10-CM | POA: Diagnosis not present

## 2014-03-12 DIAGNOSIS — R110 Nausea: Secondary | ICD-10-CM | POA: Diagnosis not present

## 2014-03-12 DIAGNOSIS — R1310 Dysphagia, unspecified: Secondary | ICD-10-CM | POA: Insufficient documentation

## 2014-03-12 DIAGNOSIS — K222 Esophageal obstruction: Secondary | ICD-10-CM

## 2014-03-14 NOTE — Progress Notes (Signed)
Patient well known to me. Agree with initial assessment and plans 

## 2014-05-29 ENCOUNTER — Encounter: Payer: Self-pay | Admitting: Family Medicine

## 2014-05-29 ENCOUNTER — Ambulatory Visit (INDEPENDENT_AMBULATORY_CARE_PROVIDER_SITE_OTHER): Payer: 59 | Admitting: Family Medicine

## 2014-05-29 VITALS — BP 102/67 | HR 77 | Temp 98.1°F | Ht 59.0 in | Wt 102.0 lb

## 2014-05-29 DIAGNOSIS — J301 Allergic rhinitis due to pollen: Secondary | ICD-10-CM

## 2014-05-29 DIAGNOSIS — J4520 Mild intermittent asthma, uncomplicated: Secondary | ICD-10-CM

## 2014-05-29 DIAGNOSIS — D649 Anemia, unspecified: Secondary | ICD-10-CM

## 2014-05-29 DIAGNOSIS — R109 Unspecified abdominal pain: Secondary | ICD-10-CM

## 2014-05-29 DIAGNOSIS — D259 Leiomyoma of uterus, unspecified: Secondary | ICD-10-CM

## 2014-05-29 DIAGNOSIS — M545 Low back pain: Secondary | ICD-10-CM

## 2014-05-29 DIAGNOSIS — K219 Gastro-esophageal reflux disease without esophagitis: Secondary | ICD-10-CM

## 2014-05-29 DIAGNOSIS — J45909 Unspecified asthma, uncomplicated: Secondary | ICD-10-CM

## 2014-05-29 DIAGNOSIS — T7840XD Allergy, unspecified, subsequent encounter: Secondary | ICD-10-CM

## 2014-05-29 DIAGNOSIS — E782 Mixed hyperlipidemia: Secondary | ICD-10-CM

## 2014-05-29 DIAGNOSIS — Z0000 Encounter for general adult medical examination without abnormal findings: Secondary | ICD-10-CM

## 2014-05-29 LAB — LIPID PANEL
Cholesterol: 176 mg/dL (ref 0–200)
HDL: 52.7 mg/dL (ref >39.00)
LDL Cholesterol: 105 mg/dL — ABNORMAL HIGH (ref 0–99)
NonHDL: 123.3
Total CHOL/HDL Ratio: 3
Triglycerides: 93 mg/dL (ref 0.0–149.0)
VLDL: 18.6 mg/dL (ref 0.0–40.0)

## 2014-05-29 LAB — CBC
HCT: 40.5 % (ref 36.0–46.0)
Hemoglobin: 14.1 g/dL (ref 12.0–15.0)
MCHC: 34.7 g/dL (ref 30.0–36.0)
MCV: 97.5 fl (ref 78.0–100.0)
PLATELETS: 306 10*3/uL (ref 150.0–400.0)
RBC: 4.16 Mil/uL (ref 3.87–5.11)
RDW: 12.4 % (ref 11.5–15.5)
WBC: 5.4 10*3/uL (ref 4.0–10.5)

## 2014-05-29 LAB — COMPREHENSIVE METABOLIC PANEL
ALT: 14 U/L (ref 0–35)
AST: 19 U/L (ref 0–37)
Albumin: 4.4 g/dL (ref 3.5–5.2)
Alkaline Phosphatase: 74 U/L (ref 39–117)
BUN: 15 mg/dL (ref 6–23)
CO2: 29 mEq/L (ref 19–32)
Calcium: 9.9 mg/dL (ref 8.4–10.5)
Chloride: 102 mEq/L (ref 96–112)
Creatinine, Ser: 0.59 mg/dL (ref 0.40–1.20)
GFR: 115.1 mL/min (ref >60.00)
Glucose, Bld: 89 mg/dL (ref 70–99)
Potassium: 4.1 mEq/L (ref 3.5–5.1)
Sodium: 137 mEq/L (ref 135–145)
Total Bilirubin: 0.9 mg/dL (ref 0.2–1.2)
Total Protein: 7.3 g/dL (ref 6.0–8.3)

## 2014-05-29 LAB — TSH: TSH: 1.73 u[IU]/mL (ref 0.35–4.50)

## 2014-05-29 MED ORDER — MONTELUKAST SODIUM 5 MG PO CHEW: 5.0000 mg | CHEWABLE_TABLET | Freq: Every evening | ORAL | Status: DC | PRN

## 2014-05-29 NOTE — Assessment & Plan Note (Signed)
Repeat CBC today 

## 2014-05-29 NOTE — Assessment & Plan Note (Addendum)
Avoid offending foods, start probiotics. Do not eat large meals in late evening and consider raising head of bed. Try to avoid eating prior to bedtime

## 2014-05-29 NOTE — Assessment & Plan Note (Addendum)
Endometrial ablation with Dr Si Raider improved symptoms, cycles still monthly and 7 days but very light

## 2014-05-29 NOTE — Assessment & Plan Note (Signed)
Improved since course of allergy shots 20 years ago

## 2014-05-29 NOTE — Progress Notes (Signed)
Pre visit review using our clinic review tool, if applicable. No additional management support is needed unless otherwise documented below in the visit note. 

## 2014-05-29 NOTE — Assessment & Plan Note (Signed)
No c/o today 

## 2014-05-31 ENCOUNTER — Encounter: Payer: Self-pay | Admitting: *Deleted

## 2014-06-03 ENCOUNTER — Encounter: Payer: Self-pay | Admitting: Family Medicine

## 2014-06-03 DIAGNOSIS — Z1371 Encounter for nonprocreative screening for genetic disease carrier status: Secondary | ICD-10-CM | POA: Insufficient documentation

## 2014-06-03 DIAGNOSIS — E782 Mixed hyperlipidemia: Secondary | ICD-10-CM | POA: Insufficient documentation

## 2014-06-03 DIAGNOSIS — Z0000 Encounter for general adult medical examination without abnormal findings: Secondary | ICD-10-CM | POA: Insufficient documentation

## 2014-06-03 NOTE — Assessment & Plan Note (Signed)
Follows with pulmonology no recent flares, no change in meds

## 2014-06-03 NOTE — Assessment & Plan Note (Addendum)
Used Singulair many years ago with good results restarted today

## 2014-06-03 NOTE — Progress Notes (Signed)
[name]  [id number] 10/27/1965 06/03/2014      Progress Note-Follow Up  Subjective  Chief Complaint  Chief Complaint  Patient presents with  . Establish Care    HPI  Patient is a 49 y.o. female in today for routine medical care. Patient is in today for annual exam. She reports she follows with GYN for her Pap smears and sees Dr. tape on. She generally feels well and has had colonoscopy with gastroenterology in the past. Has been told she has internal hemorrhoids and diverticulosis. Presently her bowels are moving well with no bloody or tarry stool but she does have trouble with intermittent abdominal pain. She's had episodes of bowel spasm which been painful off and on for some time. She notes nausea but no vomiting. No other acute illness or complaint. Denies CP/palp/SOB/HA/congestion/fever or GU c/o. Taking meds as prescribed  Past Medical History  Diagnosis Date  . Mitral valve disorders   . Contact dermatitis and other eczema, due to unspecified cause   . Esophageal reflux   . Lumbago   . IBS (irritable bowel syndrome)   . Anxiety   . Anemia   . IC (interstitial cystitis)   . Internal hemorrhoids   . Diverticulitis     CT Scan  . Asthma     allergy related  . Hyperlipidemia, mixed 06/03/2014  . Preventative health care 06/03/2014    Past Surgical History  Procedure Laterality Date  . Cesarean section    . Sinus surgery with instatrak    . Mouth surgery    . Dilitation & currettage/hystroscopy with novasure ablation N/A 10/14/2012    Procedure: DILATATION & CURETTAGE/HYSTEROSCOPY WITH NOVASURE ABLATION;  Surgeon: Lovenia Kim, MD;  Location: [facility];  Service: Gynecology;  Laterality: N/A;  . Bartholin cyst marsupialization N/A 12/18/2013    Procedure: BARTHOLIN CYST MARSUPIALIZATION WITH BIOPSY;  Surgeon: Lovenia Kim, MD;  Location: [facility];  Service: Gynecology;  Laterality: N/A;    Family History  Problem Relation Age of Onset  . Colon polyps Sister    . Mental illness Sister     anxiety from 9/11 survivors  . Varicose Veins Sister   . Diabetes Mother   . Hypertension Mother   . Heart attack Mother   . Alcohol abuse Mother     smoker  . Heart disease Mother   . Dementia Mother   . Hypertension Father   . Hyperlipidemia Father   . Heart attack Father   . Cancer Father     lung cancer, smoker  . Alcohol abuse      Family history  . Colon cancer Neg Hx   . Cancer Brother     acute leukemia  . Dementia Maternal Grandmother   . Heart disease Maternal Grandfather     mi  . Cancer Paternal Grandmother     GYN cancer  . Other Sister     hypoglycemia  . Varicose Veins Sister   . Asthma Sister     rheumatoid  . Fibromyalgia Sister   . Mental retardation Sister     depression    History   Social History  . Marital Status: Married    Spouse Name: N/A    Number of Children: N/A  . Years of Education: N/A   Occupational History  . [employer]     Social History Main Topics  . Smoking status: Never Smoker   . Smokeless tobacco: Never Used  . Alcohol Use: No  . Drug  Use: No  . 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 Not on file   Social History Narrative    Current Outpatient Prescriptions on File Prior to Visit  Medication Sig Dispense Refill  . acetaminophen (TYLENOL) 500 MG tablet Take 500 mg by mouth every 8 (eight) hours as needed for headache.    . hydrocortisone (ANUSOL-HC) 25 MG suppository Place 1 suppository (25 mg total) rectally at bedtime. X 7 days 12 suppository 1  . hyoscyamine (LEVSIN SL) 0.125 MG SL tablet Place 1 tablet (0.125 mg total) under the tongue 2 (two) times daily as needed. 60 tablet 2  . omeprazole (PRILOSEC) 20 MG capsule Take 1 capsule (20 mg total) by mouth daily. 30 capsule 3   No current facility-administered medications on file prior to visit.    Allergies  Allergen Reactions  .  Aspirin Shortness Of Breath    "irritated stomach" Ibuprofen and Naproxen do not agree with her either  . Latex Shortness Of Breath and Itching  . Other Shortness Of Breath    peanuts  . Sulfamethoxazole-Trimethoprim Shortness Of Breath  . Sulfonamide Derivatives Shortness Of Breath and Itching  . Sulphadimidine Sodium [Sulfamethazine Sodium] Shortness Of Breath    Added to food for preservative  . Amoxicillin     Doesn't remember rxn  . Carisoprodol   . Ciprofloxacin     REACTION: SOB, "Tightness in head"  . Clarithromycin     "Doesn't agree with me"  . Codeine     Does not take percocet or hydrocodone; tylenol only  . Epinephrine     Heart racing  . Metronidazole Other (See Comments)    "cannot tolerate"  . Penicillins     Doesn't remember reaction    Review of Systems  Review of Systems  Constitutional: Negative for fever, chills and malaise/fatigue.  HENT: Positive for congestion. Negative for hearing loss and nosebleeds.   Eyes: Negative for discharge.  Respiratory: Negative for cough, sputum production, shortness of breath and wheezing.   Cardiovascular: Negative for chest pain, palpitations and leg swelling.  Gastrointestinal: Positive for abdominal pain. Negative for heartburn, nausea, vomiting, diarrhea, constipation and blood in stool.  Genitourinary: Negative for dysuria, urgency, frequency and hematuria.  Musculoskeletal: Negative for myalgias, back pain and falls.  Skin: Negative for rash.  Neurological: Positive for headaches. Negative for dizziness, tremors, sensory change, focal weakness, loss of consciousness and weakness.  Endo/Heme/Allergies: Negative for polydipsia. Does not bruise/bleed easily.  Psychiatric/Behavioral: Negative for depression and suicidal ideas. The patient is not nervous/anxious and does not have insomnia.     Objective  BP 102/67 mmHg  Pulse 77  Temp(Src) 98.1 F (36.7 C) (Oral)  Ht 4\' 11"  (1.499 m)  Wt 102 lb (46.267 kg)   BMI 20.59 kg/m2  SpO2 99%  LMP 05/22/2014  Physical Exam  Physical Exam  Constitutional: She is oriented to person, place, and time and well-developed, well-nourished, and in no distress. No distress.  HENT:  Head: Normocephalic and atraumatic.  Right Ear: External ear normal.  Left Ear: External ear normal.  Nose: Nose normal.  Mouth/Throat: Oropharynx is clear and moist. No oropharyngeal exudate.  Eyes: Conjunctivae are normal. Pupils are equal, round, and reactive to light. Right eye exhibits no discharge. Left eye exhibits no discharge. No scleral icterus.  Neck: Normal range of motion. Neck supple. No thyromegaly present.  Cardiovascular:  Normal rate, regular rhythm, normal heart sounds and intact distal pulses.   No murmur heard. Pulmonary/Chest: Effort normal and breath sounds normal. No respiratory distress. She has no wheezes. She has no rales.  Abdominal: Soft. Bowel sounds are normal. She exhibits no distension and no mass. There is no tenderness.  Musculoskeletal: Normal range of motion. She exhibits no edema or tenderness.  Lymphadenopathy:    She has no cervical adenopathy.  Neurological: She is alert and oriented to person, place, and time. She has normal reflexes. No cranial nerve deficit. Coordination normal.  Skin: Skin is warm and dry. No rash noted. She is not diaphoretic.  Psychiatric: Mood, memory and affect normal.    Lab Results  Component Value Date   TSH 1.73 05/29/2014   Lab Results  Component Value Date   WBC 5.4 05/29/2014   HGB 14.1 05/29/2014   HCT 40.5 05/29/2014   MCV 97.5 05/29/2014   PLT 306.0 05/29/2014   Lab Results  Component Value Date   CREATININE 0.59 05/29/2014   BUN 15 05/29/2014   NA 137 05/29/2014   K 4.1 05/29/2014   CL 102 05/29/2014   CO2 29 05/29/2014   Lab Results  Component Value Date   ALT 14 05/29/2014   AST 19 05/29/2014   ALKPHOS 74 05/29/2014   BILITOT 0.9 05/29/2014   Lab Results  Component Value Date    CHOL 176 05/29/2014   Lab Results  Component Value Date   HDL 52.70 05/29/2014   Lab Results  Component Value Date   LDLCALC 105* 05/29/2014   Lab Results  Component Value Date   TRIG 93.0 05/29/2014   Lab Results  Component Value Date   CHOLHDL 3 05/29/2014     Assessment & Plan  ESOPHAGEAL REFLUX Avoid offending foods, start probiotics. Do not eat large meals in late evening and consider raising head of bed. Try to avoid eating prior to bedtime   DERMATITIS Improved since course of allergy shots 20 years ago   Fibroids Endometrial ablation with Dr Si Raider improved symptoms, cycles still monthly and 7 days but very light   Anemia Repeat CBC today   LOW BACK PAIN No c/o today   Asthma, exogenous Follows with pulmonology no recent flares, no change in meds   Allergic rhinitis due to pollen Used Singulair many years ago with good results restarted today   Hyperlipidemia, mixed Encouraged heart healthy diet, increase exercise, avoid trans fats, consider a krill oil cap daily. Very mild   Preventative health care Patient encouraged to maintain heart healthy diet, regular exercise, adequate sleep. Consider daily probiotics. Take medications as prescribed. Warned that colonoscopy is due at age 34. Labs performed today

## 2014-06-03 NOTE — Assessment & Plan Note (Signed)
Encouraged heart healthy diet, increase exercise, avoid trans fats, consider a krill oil cap daily. Very mild

## 2014-06-03 NOTE — Assessment & Plan Note (Signed)
Patient encouraged to maintain heart healthy diet, regular exercise, adequate sleep. Consider daily probiotics. Take medications as prescribed. Warned that colonoscopy is due at age 49. Labs performed today

## 2014-06-08 ENCOUNTER — Telehealth: Payer: Self-pay | Admitting: Physician Assistant

## 2014-06-08 NOTE — Telephone Encounter (Signed)
Left a message for patient to call back. 

## 2014-06-08 NOTE — Telephone Encounter (Signed)
Patient calling to report stomach cramping x 2 weeks. She is having urgent feeling that she needs to go to bathroom. She does not always have a bowel movement but when she does stool is soft. Multiple stools are causing hemorrhoids to hurt. Denies fever. She does have nausea off and on. She has not tried Levsin but she will try it. She is asking for antibiotics. She states Dr. Henrene Pastor usually gives them to her. Please, advise.

## 2014-06-08 NOTE — Telephone Encounter (Signed)
Pt was last seen in  November. Can try the levsin but if no improvement will need to be seen. Had cbc with PCP last week that was normal.

## 2014-06-11 NOTE — Telephone Encounter (Signed)
Left a message for patient to call back. 

## 2014-06-12 NOTE — Telephone Encounter (Signed)
Left a message for patient to call for patient to call back.

## 2014-06-13 NOTE — Telephone Encounter (Signed)
Patient did not return calls

## 2014-07-25 ENCOUNTER — Ambulatory Visit: Payer: 59 | Admitting: Internal Medicine

## 2014-07-26 ENCOUNTER — Encounter: Payer: Self-pay | Admitting: Internal Medicine

## 2014-07-26 ENCOUNTER — Ambulatory Visit (INDEPENDENT_AMBULATORY_CARE_PROVIDER_SITE_OTHER): Payer: 59 | Admitting: Internal Medicine

## 2014-07-26 VITALS — BP 112/60 | HR 84 | Ht 59.0 in | Wt 100.8 lb

## 2014-07-26 DIAGNOSIS — J301 Allergic rhinitis due to pollen: Secondary | ICD-10-CM | POA: Diagnosis not present

## 2014-07-26 DIAGNOSIS — J309 Allergic rhinitis, unspecified: Secondary | ICD-10-CM | POA: Diagnosis not present

## 2014-07-26 MED ORDER — PHENYLEPHRINE HCL 1 % NA SOLN
3.0000 [drp] | Freq: Once | NASAL | Status: AC
  Administered 2014-07-26: 3 [drp] via NASAL

## 2014-07-26 MED ORDER — AZELASTINE-FLUTICASONE 137-50 MCG/ACT NA SUSP: 1.0000 | Freq: Every day | NASAL | Status: AC

## 2014-07-26 MED ORDER — AZELASTINE HCL 0.1 % NA SOLN: NASAL | Status: DC

## 2014-07-26 NOTE — Assessment & Plan Note (Signed)
Controlling symptoms for now with Astelin nasal spray, Alavert and saline rinse. Would like nasal nebulizer treatment but we agreed not to give cortisone injection. Plan-nasal nebulizer decongestant. Consider Flonase if needed.

## 2014-07-26 NOTE — Patient Instructions (Addendum)
Neb neo nasal  Ok to use Astelin spray when your nose is wet/ runny/ sneezing Script to refill.  Sample for Dymista nasal spray, 1-2 puffs each nostril once daily if needed to compare with Astelin.   Suggest adding otc Flonase 1-2 puffs each nostril once or twice daily during allergy seasons.  Nasal neo neb

## 2014-07-26 NOTE — Progress Notes (Signed)
Patient ID: [name], female    DOB: Sep 09, 1965, 49 y.o.   MRN: [id number]  HPI 11/21/10 Hosp 1 year ago- dehydration, heat exhaustion, low iron level.  Thinks chronic singulair is bothering stomach. After long time use, then a hiatus, she took one and had diarrhea.  Does saline nasal rinse/ squeeze bottle every morning.  Feels tightness in chest off and on. Some dry cough and tight chest. Always very sensitive to stimulants, including Atrovent HFA, because of tachypalpitation she relates to mitral prolapse.   03/04/11-  89 yoF never smoker returning to this area after last seen 2009. Hx allergic rhinitis, asthma, anxiety. She has had flu vaccine. Has a new job doing Journalist, newspaper. Cold air is tightening in her chest some when she goes outdoors. Occasional dry, tight cough. We have prescribed a nebulizer machine but she turned back in because her insurance wouldn't cover it. She is taking Singulair 4 mg every other day and emphasizes her intolerance to many medications. Minor epistaxis, using saline gel. Still has some left over Tilade nebulizer solution but we discussed options. She volunteers that she is now recognizing the importance of anxiety with many of her respiratory symptoms. PFT 11/26/2010 within normal limits within significant response to bronchodilator. FEV1/FVC 0.79. Results were reviewed with her, including a lengthy discussion of the significance of the different subtests.  08/26/11 -38 yoF never smoker returning to this area after last seen 2009. Hx allergic rhinitis, asthma, anxiety. " breathing too well"; saw Dr Arnoldo Morale last week and breathing heavy. unsure if due to exercise or weather Joined a gym class for exercise. Using Qnasl which she understood to be for nose and chest. Has an old nebulizer machine. She credits Singulair with helping reduce her "inflammation". Little cough, wheeze, phlegm.  02/24/12- 64 yoF never smoker returning to this area after last seen 2009.  Hx allergic rhinitis, asthma, anxiety. Clear colored mucus; congestion since last visit 2 weeks ago diverticulitis was treated with doxycycline without surgery. Told by GI that she has GERD. Has had recent nasal congestion some nose bleeding and head "feels heavy". Minor cough with clear mucus. Qnasl caused malaise. Off of Singulair saying 5 mg dose was better for her stomach.  08/25/12- 2 yoF never smoker followed for allergic rhinitis, asthma. Hx  anxiety. FOLLOWS FOR: pt states that breathing is acting up-for about 1 week having sinus pressure; feels  like throat is full and bringing up colored phelgm. Mucinex caused nausea. Taking Alavert and Children's Sudafed which we discussed. Throat feels raspy with clear mucus.  03/01/13- 22 yoF never smoker followed for allergic rhinitis, asthma. Hx  Anxiety. FOLLOWS FOR: alot of pressure on left side of head, congestion.  Left frontal headache for the past 7-10 days with a left maxillary sinus pressure. She questions acute sinusitis. Little discharge, some clear watery rhinorrhea. Using Sudafed every other day, Advil. Not using Qnasl. Chest feels clear.  05/04/13- 15 yoF never smoker followed for allergic rhinitis, asthma. Hx  Anxiety. FOLLOWS FOR: States that she was given doxycycline last week by [place] for URI. Now infection has moved to her chest. Has lots of chest congestion and coughing. Mother died-autopsy pending We had sent doxycycline. Sinus is now clear. Still some postnasal drip and episodes of hard coughing with clear mucus.Loratadine helps more than other products tried.  07/24/13- 68 yoF never smoker followed for allergic rhinitis, asthma. Hx  Anxiety. ACUTE VISIT: Left ear(pressure and achy) and jaw pain-can't open mouth all the way;  crackle sounds(noticed when doing yoga). Completed Zpak last night; nausea feelings as well. Notices crackles in chest when lying down on her back. Recognized she was clenching her teeth after a trip to  [place]-husband"s driving made her anxious.  07/26/14- 48 yoF never smoker followed for allergic rhinitis, asthma. Hx  Anxiety. FOLLOWS FOR: headache, increased allergy symptoms Normal Ba swallow 03/12/14 Taking one half Alavert tablet twice daily to control rhinitis and minimize dryness. Neti pot once or twice daily. Likes Astelin nasal spray if needed.  Review of Systems-see HPI Constitutional:   No-   weight loss, night sweats, fevers, chills, fatigue, lassitude. HEENT:   + headaches, difficulty swallowing, tooth/dental problems, sore throat,       No-  sneezing, itching, +ear ache, +nasal congestion, +post nasal drip,  CV:  No-   chest pain, orthopnea, PND, swelling in lower extremities, anasarca, dizziness, palpitations Resp: +shortness of breath with exertion or at rest.              No-productive cough,  No non-productive cough,  No- coughing up of blood.              No-   change in color of mucus.  No recent  wheezing.   Skin: No-   rash or lesions. GI:  No-   heartburn, indigestion, abdominal pain, nausea, vomiting, GU:  MS:  No-   joint pain or swelling.   Neuro-     nothing unusual Psych:  No- change in mood or affect. No depression + anxiety.  No memory loss.  Objective:   Physical Exam General- Alert, Oriented, Affect-anxious/ talkative, Distress- none acute, trim, petite woman    Skin- rash-none, lesions- none, excoriation- none Lymphadenopathy- none Head- atraumatic            Eyes- Gross vision intact, PERRLA, conjunctivae clear secretions            Ears- Hearing, canals normal. +Scarring or sclerosis TMs            Nose- +Narrow with clear mucus bridging, No- Septal dev, polyps, erosion, perforation             Throat- Mallampati II , mucosa clear , drainage- none, tonsils- atrophic Neck- flexible , trachea midline, no stridor , thyroid nl, carotid no bruit Chest - symmetrical excursion , unlabored           Heart/CV- RRR , no murmur , no gallop  , no rub, nl s1  s2                           - JVD- none , edema- none, stasis changes- none, varices- none           Lung- clear to P&A, wheeze- none, cough- none , dullness-none, rub- none           Chest wall-  Abd-  Br/ Gen/ Rectal- Not done, not indicated Extrem- cyanosis- none, clubbing, none, atrophy- none, strength- nl Neuro- grossly intact to observation

## 2014-08-29 ENCOUNTER — Telehealth: Payer: Self-pay | Admitting: Family Medicine

## 2014-08-29 NOTE — Telephone Encounter (Signed)
CALLER: Carleena Neupert Rel to Pt: self Ph#: [phone]  Pt concerned because she found what she believes to be a small tick on her side. She wants to know what she should do or what to watch for in case it was a tick and bit her for a long period of time. Believes it came from her dog. Please call pt to advise. Pt states she may not be able to answer as she is at work. Please leave her a message so she doesn't worry. Thank you.

## 2014-08-30 NOTE — Telephone Encounter (Signed)
Patient informed of PCP instructions.  She will hold for now on labs.

## 2014-08-30 NOTE — Telephone Encounter (Signed)
Called left message to call back 

## 2014-08-30 NOTE — Telephone Encounter (Signed)
So she should watch for a rash, headache, fevers, myalgias. Can run labs if she would like. RMSF and lymer

## 2014-09-03 NOTE — Telephone Encounter (Addendum)
Pt requesting labs orders for 09/04/14 pt would like her potassium, iron, lime disease, arthritis lab work up? Pt would like a follow up call please leave detail message stating orders have been placed. Please call  [phone] thank you

## 2014-09-03 NOTE — Telephone Encounter (Signed)
I do not have enough info in her chart to guarantee the lab work she is requesting will be covered by insurance. Her CBC was normal so cannot say they will pay for iron level. No symptoms suggestive of Lyme. Could order sed rate and rf for back pain. Or she can come in for appt so we can document her symptoms and try and get insurance to pay for the labs

## 2014-09-04 ENCOUNTER — Other Ambulatory Visit: Payer: Self-pay | Admitting: Family Medicine

## 2014-09-04 DIAGNOSIS — R5382 Chronic fatigue, unspecified: Secondary | ICD-10-CM

## 2014-09-04 DIAGNOSIS — W57XXXA Bitten or stung by nonvenomous insect and other nonvenomous arthropods, initial encounter: Secondary | ICD-10-CM

## 2014-09-04 NOTE — Telephone Encounter (Signed)
The patient would like only lyme and RMSF ordered for the tick she found.  She is feeling very tired and going to be early.

## 2014-09-04 NOTE — Telephone Encounter (Signed)
I have ordered lyme and RMSF for Thursday

## 2014-09-04 NOTE — Telephone Encounter (Signed)
Called left message to call back 

## 2014-09-07 ENCOUNTER — Other Ambulatory Visit (INDEPENDENT_AMBULATORY_CARE_PROVIDER_SITE_OTHER): Payer: 59

## 2014-09-07 DIAGNOSIS — W57XXXA Bitten or stung by nonvenomous insect and other nonvenomous arthropods, initial encounter: Secondary | ICD-10-CM

## 2014-09-07 DIAGNOSIS — R5382 Chronic fatigue, unspecified: Secondary | ICD-10-CM

## 2014-09-10 LAB — B. BURGDORFI ANTIBODIES: B burgdorferi Ab IgG+IgM: 0.18 {ISR}

## 2014-09-10 LAB — ROCKY MTN SPOTTED FVR ABS PNL(IGG+IGM)
RMSF IGG: 0.11 IV
RMSF IGM: 0.34 IV

## 2014-09-11 ENCOUNTER — Telehealth: Payer: Self-pay | Admitting: Family Medicine

## 2014-09-11 NOTE — Telephone Encounter (Signed)
Pt called back to let you know she received your message.

## 2014-10-10 ENCOUNTER — Telehealth: Payer: Self-pay | Admitting: Internal Medicine

## 2014-10-10 NOTE — Telephone Encounter (Signed)
Received message from pt. She is unable to speak to [place] while at work. Wants appointment with CY on Friday for cough and congestion.  CY - please advise if we can work pt in on Friday. Thanks.

## 2014-10-10 NOTE — Telephone Encounter (Signed)
Left message for patient stating she has been added to CY's schedule on Friday 10-12-14 at 8:30am. Nothing more needed at this time.

## 2014-10-11 ENCOUNTER — Telehealth: Payer: Self-pay | Admitting: Internal Medicine

## 2014-10-11 ENCOUNTER — Ambulatory Visit (INDEPENDENT_AMBULATORY_CARE_PROVIDER_SITE_OTHER): Payer: 59 | Admitting: Medical

## 2014-10-11 ENCOUNTER — Encounter: Payer: Self-pay | Admitting: Medical

## 2014-10-11 VITALS — BP 110/70 | HR 86 | Temp 98.1°F | Ht 59.0 in | Wt 101.4 lb

## 2014-10-11 DIAGNOSIS — J329 Chronic sinusitis, unspecified: Secondary | ICD-10-CM | POA: Insufficient documentation

## 2014-10-11 DIAGNOSIS — J0100 Acute maxillary sinusitis, unspecified: Secondary | ICD-10-CM | POA: Insufficient documentation

## 2014-10-11 DIAGNOSIS — J324 Chronic pansinusitis: Secondary | ICD-10-CM

## 2014-10-11 MED ORDER — DOXYCYCLINE HYCLATE 100 MG PO TABS: 100.0000 mg | ORAL_TABLET | Freq: Two times a day (BID) | ORAL | Status: DC

## 2014-10-11 MED ORDER — FLUTICASONE PROPIONATE 50 MCG/ACT NA SUSP: 2.0000 | Freq: Every day | NASAL | Status: DC

## 2014-10-11 NOTE — Patient Instructions (Addendum)
Sinusitis, acute maxillary May have followed allergies. Rx flonase and doxycycline. Rx advisement given.  Mucinex otc for cough since you have used and no reaction.   Your transient chest pain sound like costochondritis. But if constant pain then our clinic or ED need to evaluate and get ekg.  If your fatigue worsens let us know and could get cbc. Based on discussion and your concern about anemia.  Follow up in 7 days or as needed

## 2014-10-11 NOTE — Telephone Encounter (Signed)
FYI.  Nurse called and reported [facility] called and was asking to change a medication - secondary to insurance.  Called [facility] back multiple times.  Unable to get anyone to answer.  Called answering service back and informed that I was unable to get anyone to answer at [facility].  Informed if called back, to let them know that I am trying to reach them and to either get another number, etc.  Tried again multiple times to reach and unable.

## 2014-10-11 NOTE — Progress Notes (Signed)
   Subjective:    Patient ID: [name], female    DOB: 12-Jan-1966, 49 y.o.   MRN: [id number]  HPI  Pt in with nasal congestion and  sinus pressure for one week. Some coughing,  some itching eyes and sneezing. Pt has some mild ha today and points to frontal sinus region. Pt has history of allergies. Some history of immunotherapy.  Some clear type mucous when coughs.    Review of Systems  Constitutional: Positive for fever. Negative for chills and fatigue.  HENT: Positive for congestion and sinus pressure.   Respiratory: Positive for cough. Negative for chest tightness, shortness of breath and wheezing.   Cardiovascular: Negative for chest pain and palpitations.       On and off transient faint chest discomfort. This morning. Lasted for few seconds. Then notes occurred with deep breath and transient only one second.  Gastrointestinal: Positive for nausea. Negative for vomiting, abdominal pain, diarrhea, constipation and blood in stool.       Mild nausea.  Musculoskeletal: Negative for back pain.  Neurological: Positive for headaches.  Psychiatric/Behavioral: Negative for behavioral problems and confusion.       Objective:   Physical Exam  General  Mental Status - Alert. General Appearance - Well groomed. Not in acute distress.  Skin Rashes- No Rashes.  HEENT Head- Normal. Ear Auditory Canal - Left- Normal. Right - Normal.Tympanic Membrane- Left- Normal. Right- Normal. Eye Sclera/Conjunctiva- Left- Normal. Right- Normal. Nose & Sinuses Nasal Mucosa- Left-  Boggy and Congested. Right-  Boggy and  Congested.Bilateral maxillary pressure  But no  frontal sinus pressure. Mouth & Throat Lips: Upper Lip- Normal: no dryness, cracking, pallor, cyanosis, or vesicular eruption. Lower Lip-Normal: no dryness, cracking, pallor, cyanosis or vesicular eruption. Buccal Mucosa- Bilateral- No Aphthous ulcers. Oropharynx- No Discharge or Erythema. +pnd Tonsils: Characteristics-  Bilateral- No Erythema or Congestion. Size/Enlargement- Bilateral- No enlargement. Discharge- bilateral-None.  Neck Neck- Supple. No Masses.   Chest and Lung Exam Auscultation: Breath Sounds:-Clear even and unlabored.  Cardiovascular Auscultation:Rythm- Regular, rate and rhythm. Murmurs & Other Heart Sounds:Ausculatation of the heart reveal- No Murmurs.  Lymphatic Head & Neck General Head & Neck Lymphatics: Bilateral: Description- No Localized lymphadenopathy.  Anterior thorax- no pain on palpation. No pain on deep inspiration.(no current chest pain)       Assessment & Plan:  Note per med review doxycycline is on her list various times consecutively. Also the mg dosage of this med matches what she describes she could take with no reaction. Not listed on allergy list.

## 2014-10-11 NOTE — Assessment & Plan Note (Deleted)
May have followed allergies. Rx flonase and doxycycline. Rx advisement given.  Mucinex otc for cough since you have used and no reaction.

## 2014-10-11 NOTE — Progress Notes (Signed)
Pre visit review using our clinic review tool, if applicable. No additional management support is needed unless otherwise documented below in the visit note. 

## 2014-10-11 NOTE — Assessment & Plan Note (Signed)
May have followed allergies. Rx flonase and doxycycline. Rx advisement given.  Mucinex otc for cough since you have used and no reaction.

## 2014-10-12 ENCOUNTER — Ambulatory Visit: Payer: 59 | Admitting: Internal Medicine

## 2014-10-24 ENCOUNTER — Ambulatory Visit (INDEPENDENT_AMBULATORY_CARE_PROVIDER_SITE_OTHER): Payer: 59 | Admitting: Medical

## 2014-10-24 ENCOUNTER — Encounter: Payer: Self-pay | Admitting: Medical

## 2014-10-24 ENCOUNTER — Telehealth: Payer: Self-pay | Admitting: Family Medicine

## 2014-10-24 VITALS — BP 110/50 | HR 77 | Temp 98.6°F | Ht 59.0 in | Wt 100.6 lb

## 2014-10-24 DIAGNOSIS — R0789 Other chest pain: Secondary | ICD-10-CM | POA: Diagnosis not present

## 2014-10-24 DIAGNOSIS — J301 Allergic rhinitis due to pollen: Secondary | ICD-10-CM

## 2014-10-24 DIAGNOSIS — J0100 Acute maxillary sinusitis, unspecified: Secondary | ICD-10-CM

## 2014-10-24 DIAGNOSIS — R5383 Other fatigue: Secondary | ICD-10-CM | POA: Insufficient documentation

## 2014-10-24 MED ORDER — PREDNISONE 5 MG PO TABS: 5.0000 mg | ORAL_TABLET | Freq: Every day | ORAL | Status: DC

## 2014-10-24 MED ORDER — BENZONATATE 100 MG PO CAPS: 100.0000 mg | ORAL_CAPSULE | Freq: Two times a day (BID) | ORAL | Status: DC | PRN

## 2014-10-24 NOTE — Telephone Encounter (Signed)
Pt needs appointment today, tomorrow or on Friday. Will get lpn to notify pt.

## 2014-10-24 NOTE — Patient Instructions (Signed)
Sinusitis, acute maxillary Resolved now.  Allergic rhinitis due to pollen I do think your pnd and cough is from allergies. Continue claritin. Use flonase. Rx of prednisone 5 mg tabs. Low dose decided after we discussed. Benzonatate for cough.  If cough persists by Tuesday let us know.(cxr offered today but declined).  Fatigue Decided to get cbc and cmp today.  Chest pressure Very faint, brief and atypical. Lasted 1-2 seconds. EKG looked good today. However, want you watch this if this is more constant, more severe, or associated signs or symptoms then ED evaluation. In females if they present with persistent atypical symptoms then would refer to cardiology. Please update [place] on Tuesday on if this is reoccuring or changing.   Follow up in 7 days or as needed

## 2014-10-24 NOTE — Telephone Encounter (Signed)
Patient coming in for appointment today.

## 2014-10-24 NOTE — Progress Notes (Signed)
Pre visit review using our clinic review tool, if applicable. No additional management support is needed unless otherwise documented below in the visit note. 

## 2014-10-24 NOTE — Progress Notes (Signed)
Subjective:    Patient ID: [name], female    DOB: Aug 14, 1965, 49 y.o.   MRN: [id number]  HPI  Pt sinus pressure did clear up with the antibiotic in 10 days.   Pt when she clears throat and cough brings up mild mucous but states is clear. Pt stopped her mucinex. Pt can feel pnd that is constant and causes a cough  Pt states hx of asthma but no flares recently.  Pt used to have allergies and more severe in the past. Right now just feels mostly pnd. Pt has she did not take flonase. She is on claritin. But questions if she may need steroid.   Pt was having very sporadic very rare 1-2 second faint chest pressure. Last time happened maybe yesterday evening(none today). Occuring last 2 weeks very rare and random and when it does occurs lasts 1-2 seconds. No palpitions. No associated arm pain, no jaw pain, no sob, no wheezing. No diarphoreis. This symptom not present during interview or exam. None today.    Review of Systems  Constitutional: Positive for fatigue. Negative for fever and chills.       Mild recently.  Eyes: Negative for pain.       Rare occasional twitch to eye lid.  Respiratory: Positive for cough. Negative for choking, chest tightness and wheezing.   Cardiovascular: Negative for chest pain and palpitations.       Atyipical chest pain/pressur vs palpitation. Last 1-2 seconds. No associated cardiac type symptoms.  Musculoskeletal: Negative for back pain.  Neurological: Negative for dizziness, light-headedness, numbness and headaches.  Hematological: Negative for adenopathy. Does not bruise/bleed easily.   Past Medical History  Diagnosis Date  . Mitral valve disorders   . Contact dermatitis and other eczema, due to unspecified cause   . Esophageal reflux   . Lumbago   . IBS (irritable bowel syndrome)   . Anxiety   . Anemia   . IC (interstitial cystitis)   . Internal hemorrhoids   . Diverticulitis     CT Scan  . Asthma     allergy related  .  Hyperlipidemia, mixed 06/03/2014  . Preventative health care 06/03/2014    History   Social History  . Marital Status: Married    Spouse Name: N/A  . Number of Children: N/A  . Years of Education: N/A   Occupational History  . [employer]     Social History Main Topics  . Smoking status: Never Smoker   . Smokeless tobacco: Never Used  . Alcohol Use: No  . Drug Use: No  . 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 Not on file   Social History Narrative    Past Surgical History  Procedure Laterality Date  . Cesarean section    . Sinus surgery with instatrak    . Mouth surgery    . Dilitation & currettage/hystroscopy with novasure ablation N/A 10/14/2012    Procedure: DILATATION & CURETTAGE/HYSTEROSCOPY WITH NOVASURE ABLATION;  Surgeon: Lovenia Kim, MD;  Location: [facility];  Service: Gynecology;  Laterality: N/A;  . Bartholin cyst marsupialization N/A 12/18/2013    Procedure: BARTHOLIN CYST MARSUPIALIZATION WITH BIOPSY;  Surgeon: Lovenia Kim, MD;  Location: [facility];  Service: Gynecology;  Laterality: N/A;    Family History  Problem Relation Age of Onset  . Colon polyps Sister   . Mental illness Sister  anxiety from 9/11 survivors  . Varicose Veins Sister   . Diabetes Mother   . Hypertension Mother   . Heart attack Mother   . Alcohol abuse Mother     smoker  . Heart disease Mother   . Dementia Mother   . Hypertension Father   . Hyperlipidemia Father   . Heart attack Father   . Cancer Father     lung cancer, smoker  . Alcohol abuse      Family history  . Colon cancer Neg Hx   . Cancer Brother     acute leukemia  . Dementia Maternal Grandmother   . Heart disease Maternal Grandfather     mi  . Cancer Paternal Grandmother     GYN cancer  . Other Sister     hypoglycemia  . Varicose Veins Sister   . Asthma Sister     rheumatoid  . Fibromyalgia Sister   . Mental  retardation Sister     depression    Allergies  Allergen Reactions  . Aspirin Shortness Of Breath    "irritated stomach" Ibuprofen and Naproxen do not agree with her either  . Latex Shortness Of Breath and Itching  . Other Shortness Of Breath    peanuts  . Sulfamethoxazole-Trimethoprim Shortness Of Breath  . Sulfonamide Derivatives Shortness Of Breath and Itching  . Sulphadimidine Sodium [Sulfamethazine Sodium] Shortness Of Breath    Added to food for preservative  . Amoxicillin     Doesn't remember rxn  . Carisoprodol   . Ciprofloxacin     REACTION: SOB, "Tightness in head"  . Clarithromycin     "Doesn't agree with me"  . Codeine     Does not take percocet or hydrocodone; tylenol only  . Epinephrine     Heart racing  . Metronidazole Other (See Comments)    "cannot tolerate"  . Penicillins     Doesn't remember reaction    Current Outpatient Prescriptions on File Prior to Visit  Medication Sig Dispense Refill  . fluticasone (FLONASE) 50 MCG/ACT nasal spray Place 2 sprays into both nostrils daily. 16 g 0   No current facility-administered medications on file prior to visit.    BP 110/50 mmHg  Pulse 77  Temp(Src) 98.6 F (37 C) (Oral)  Ht 4\' 11"  (1.499 m)  Wt 100 lb 9.6 oz (45.632 kg)  BMI 20.31 kg/m2  SpO2 100%  LMP 10/11/2014      Objective:   Physical Exam  General Mental Status- Alert. General Appearance- Not in acute distress.   Skin General: Color- Normal Color. Moisture- Normal Moisture.    HEENT Head- Normal. Ear Auditory Canal - Left- Normal. Right - Normal.Tympanic Membrane- Left- Normal. Right- Normal. Eye Sclera/Conjunctiva- Left- Normal. Right- Normal. Nose & Sinuses Nasal Mucosa- Left-  Boggy and Congested. Right-  Boggy and  Congested.Bilateral no  maxillary and  No frontal sinus pressure. Mouth & Throat Lips: Upper Lip- Normal: no dryness, cracking, pallor, cyanosis, or vesicular eruption. Lower Lip-Normal: no dryness, cracking,  pallor, cyanosis or vesicular eruption. Buccal Mucosa- Bilateral- No Aphthous ulcers. Oropharynx- No Discharge or Erythema. +pnd Tonsils: Characteristics- Bilateral- No Erythema or Congestion. Size/Enlargement- Bilateral- No enlargement. Discharge- bilateral-None.  Neck Neck- Supple. No Masses.  .  Neck Carotid Arteries- Normal color. Moisture- Normal Moisture. No carotid bruits. No JVD.  Chest and Lung Exam Auscultation: Breath Sounds:-Normal. CTA  Cardiovascular Auscultation:Rythm- Regular, rate and rythm Murmurs & Other Heart Sounds:Auscultation of the heart reveals- No Murmurs.  Abdomen Inspection:-Inspeection Normal.  Palpation/Percussion:Note:No mass. Palpation and Percussion of the abdomen reveal- Non Tender, Non Distended + BS, no rebound or guarding.    Neurologic Cranial Nerve exam:- CN III-XII intact(No nystagmus), symmetric smile. Strength:- 5/5 equal and symmetric strength both upper and lower extremities.  Anterior thorax- no chest wall pain on palpation.      Assessment & Plan:

## 2014-10-24 NOTE — Telephone Encounter (Signed)
Left message for patient to call back for appointment.

## 2014-10-24 NOTE — Assessment & Plan Note (Signed)
Resolved now. 

## 2014-10-24 NOTE — Assessment & Plan Note (Signed)
Decided to get cbc and cmp today.

## 2014-10-24 NOTE — Telephone Encounter (Signed)
Relation to pt: self  Call back number: [phone]   Reason for call:  Pt states sytomps have not improved as per discussion at office visit 10/11/2014. Pt requesting EKG orders pt would prefer this Thursday morning before 11am or Friday morning. Please advise

## 2014-10-24 NOTE — Telephone Encounter (Signed)
.  [name] called back. I scheduled her for 4:00pm. Please call her back if you still need to speak with her. She said that she is out of work.

## 2014-10-24 NOTE — Assessment & Plan Note (Signed)
I do think your pnd and cough is from allergies. Continue claritin. Use flonase. Rx of prednisone 5 mg tabs. Low dose decided after we discussed. Benzonatate for cough.  If cough persists by Tuesday let us know.(cxr offered today but declined).

## 2014-10-24 NOTE — Assessment & Plan Note (Signed)
Very faint, brief and atypical. Lasted 1-2 seconds. EKG looked good today. However, want you watch this if this is more constant, more severe, or associated signs or symptoms then ED evaluation. In females if they present with persistent atypical symptoms then would refer to cardiology. Please update [place] on Tuesday on if this is reoccuring or changing.

## 2014-10-25 LAB — CBC WITH DIFFERENTIAL/PLATELET
Basophils Absolute: 0.1 10*3/uL (ref 0.0–0.1)
Basophils Relative: 2.4 % (ref 0.0–3.0)
EOS PCT: 2.1 % (ref 0.0–5.0)
Eosinophils Absolute: 0.1 10*3/uL (ref 0.0–0.7)
HCT: 41 % (ref 36.0–46.0)
Hemoglobin: 14.1 g/dL (ref 12.0–15.0)
LYMPHS ABS: 2.2 10*3/uL (ref 0.7–4.0)
Lymphocytes Relative: 40.5 % (ref 12.0–46.0)
MCHC: 34.5 g/dL (ref 30.0–36.0)
MCV: 98.9 fl (ref 78.0–100.0)
Monocytes Absolute: 0.4 10*3/uL (ref 0.1–1.0)
Monocytes Relative: 6.4 % (ref 3.0–12.0)
NEUTROS ABS: 2.7 10*3/uL (ref 1.4–7.7)
Neutrophils Relative %: 48.6 % (ref 43.0–77.0)
Platelets: 274 10*3/uL (ref 150.0–400.0)
RBC: 4.15 Mil/uL (ref 3.87–5.11)
RDW: 12.4 % (ref 11.5–15.5)
WBC: 5.5 10*3/uL (ref 4.0–10.5)

## 2014-10-25 LAB — COMPREHENSIVE METABOLIC PANEL
ALT: 13 U/L (ref 0–35)
AST: 18 U/L (ref 0–37)
Albumin: 4.1 g/dL (ref 3.5–5.2)
Alkaline Phosphatase: 65 U/L (ref 39–117)
BUN: 16 mg/dL (ref 6–23)
CHLORIDE: 103 meq/L (ref 96–112)
CO2: 30 mEq/L (ref 19–32)
Calcium: 9.1 mg/dL (ref 8.4–10.5)
Creatinine, Ser: 0.6 mg/dL (ref 0.40–1.20)
GFR: 112.7 mL/min (ref >60.00)
GLUCOSE: 85 mg/dL (ref 70–99)
Potassium: 3.6 mEq/L (ref 3.5–5.1)
Sodium: 137 mEq/L (ref 135–145)
TOTAL PROTEIN: 7.3 g/dL (ref 6.0–8.3)
Total Bilirubin: 0.6 mg/dL (ref 0.2–1.2)

## 2014-10-30 ENCOUNTER — Telehealth: Payer: Self-pay | Admitting: Family Medicine

## 2014-10-30 DIAGNOSIS — R0789 Other chest pain: Secondary | ICD-10-CM

## 2014-10-30 NOTE — Telephone Encounter (Signed)
Left a message for call back.  

## 2014-10-30 NOTE — Telephone Encounter (Signed)
Caller name: Daurice Relation to pt: Call back [phone] ok to leave message. Pharmacy:  Reason for call:   Patient states that she would like to be referred to cardiology. She is also requesting last lab results. She states that she would like office visit mailed to her. Saw edward for this.

## 2014-11-02 ENCOUNTER — Encounter: Payer: Self-pay | Admitting: Medical

## 2014-11-02 ENCOUNTER — Telehealth: Payer: Self-pay | Admitting: Family Medicine

## 2014-11-02 NOTE — Telephone Encounter (Signed)
Relation to pt: self  Call back number: [phone]   Reason for call:  Pt 2nd request please mail lab results taken 10/24/2014 with Johnnette Gourd.

## 2014-11-02 NOTE — Progress Notes (Unsigned)
Mailed copy of labs to patient per her request. (2nd)

## 2014-11-09 ENCOUNTER — Encounter: Payer: 59 | Admitting: Cardiology

## 2014-11-09 ENCOUNTER — Encounter: Payer: Self-pay | Admitting: Cardiology

## 2014-11-09 NOTE — Progress Notes (Signed)
Patient canceled.  This encounter was created in error - please disregard. 

## 2014-11-14 ENCOUNTER — Telehealth: Payer: Self-pay | Admitting: Internal Medicine

## 2014-11-14 ENCOUNTER — Other Ambulatory Visit: Payer: Self-pay | Admitting: Physician Assistant

## 2014-11-14 ENCOUNTER — Other Ambulatory Visit: Payer: Self-pay

## 2014-11-14 MED ORDER — HYOSCYAMINE SULFATE 0.125 MG SL SUBL: SUBLINGUAL_TABLET | SUBLINGUAL | Status: DC

## 2014-11-14 NOTE — Telephone Encounter (Signed)
The patient does not want ATB's . She is having abdominal cramps and would like a refill on Levsin. She denies diarrhea, bloody stools or fever. Refilled medication. She will call back if she acutely worsens or fails to improve.

## 2014-11-15 ENCOUNTER — Encounter: Payer: Self-pay | Admitting: Internal Medicine

## 2014-11-15 ENCOUNTER — Ambulatory Visit (INDEPENDENT_AMBULATORY_CARE_PROVIDER_SITE_OTHER): Payer: 59 | Admitting: Internal Medicine

## 2014-11-15 ENCOUNTER — Telehealth: Payer: Self-pay | Admitting: Internal Medicine

## 2014-11-15 VITALS — BP 118/76 | HR 73 | Ht 59.0 in | Wt 99.8 lb

## 2014-11-15 DIAGNOSIS — J4520 Mild intermittent asthma, uncomplicated: Secondary | ICD-10-CM

## 2014-11-15 DIAGNOSIS — J301 Allergic rhinitis due to pollen: Secondary | ICD-10-CM

## 2014-11-15 MED ORDER — METHYLPREDNISOLONE ACETATE 80 MG/ML IJ SUSP
40.0000 mg | Freq: Once | INTRAMUSCULAR | Status: AC
  Administered 2014-11-15: 40 mg via INTRAMUSCULAR

## 2014-11-15 NOTE — Progress Notes (Signed)
Patient ID: [name], female    DOB: December 16, 1965, 49 y.o.   MRN: [id number]  HPI 11/21/10 Hosp 1 year ago- dehydration, heat exhaustion, low iron level.  Thinks chronic singulair is bothering stomach. After long time use, then a hiatus, she took one and had diarrhea.  Does saline nasal rinse/ squeeze bottle every morning.  Feels tightness in chest off and on. Some dry cough and tight chest. Always very sensitive to stimulants, including Atrovent HFA, because of tachypalpitation she relates to mitral prolapse.   03/04/11-  1 yoF never smoker returning to this area after last seen 2009. Hx allergic rhinitis, asthma, anxiety. She has had flu vaccine. Has a new job doing Journalist, newspaper. Cold air is tightening in her chest some when she goes outdoors. Occasional dry, tight cough. We have prescribed a nebulizer machine but she turned back in because her insurance wouldn't cover it. She is taking Singulair 4 mg every other day and emphasizes her intolerance to many medications. Minor epistaxis, using saline gel. Still has some left over Tilade nebulizer solution but we discussed options. She volunteers that she is now recognizing the importance of anxiety with many of her respiratory symptoms. PFT 11/26/2010 within normal limits within significant response to bronchodilator. FEV1/FVC 0.79. Results were reviewed with her, including a lengthy discussion of the significance of the different subtests.  08/26/11 -13 yoF never smoker returning to this area after last seen 2009. Hx allergic rhinitis, asthma, anxiety. " breathing too well"; saw Dr Arnoldo Morale last week and breathing heavy. unsure if due to exercise or weather Joined a gym class for exercise. Using Qnasl which she understood to be for nose and chest. Has an old nebulizer machine. She credits Singulair with helping reduce her "inflammation". Little cough, wheeze, phlegm.  02/24/12- 34 yoF never smoker returning to this area after last seen 2009.  Hx allergic rhinitis, asthma, anxiety. Clear colored mucus; congestion since last visit 2 weeks ago diverticulitis was treated with doxycycline without surgery. Told by GI that she has GERD. Has had recent nasal congestion some nose bleeding and head "feels heavy". Minor cough with clear mucus. Qnasl caused malaise. Off of Singulair saying 5 mg dose was better for her stomach.  08/25/12- 74 yoF never smoker followed for allergic rhinitis, asthma. Hx  anxiety. FOLLOWS FOR: pt states that breathing is acting up-for about 1 week having sinus pressure; feels  like throat is full and bringing up colored phelgm. Mucinex caused nausea. Taking Alavert and Children's Sudafed which we discussed. Throat feels raspy with clear mucus.  03/01/13- 53 yoF never smoker followed for allergic rhinitis, asthma. Hx  Anxiety. FOLLOWS FOR: alot of pressure on left side of head, congestion.  Left frontal headache for the past 7-10 days with a left maxillary sinus pressure. She questions acute sinusitis. Little discharge, some clear watery rhinorrhea. Using Sudafed every other day, Advil. Not using Qnasl. Chest feels clear.  05/04/13- 8 yoF never smoker followed for allergic rhinitis, asthma. Hx  Anxiety. FOLLOWS FOR: States that she was given doxycycline last week by [place] for URI. Now infection has moved to her chest. Has lots of chest congestion and coughing. Mother died-autopsy pending We had sent doxycycline. Sinus is now clear. Still some postnasal drip and episodes of hard coughing with clear mucus.Loratadine helps more than other products tried.  07/24/13- 12 yoF never smoker followed for allergic rhinitis, asthma. Hx  Anxiety. ACUTE VISIT: Left ear(pressure and achy) and jaw pain-can't open mouth all the way;  crackle sounds(noticed when doing yoga). Completed Zpak last night; nausea feelings as well. Notices crackles in chest when lying down on her back. Recognized she was clenching her teeth after a trip to  [place]-husband"s driving made her anxious.  07/26/14- 48 yoF never smoker followed for allergic rhinitis, asthma. Hx  Anxiety. FOLLOWS FOR: headache, increased allergy symptoms Normal Ba swallow 03/12/14 Taking one half Alavert tablet twice daily to control rhinitis and minimize dryness. Neti pot once or twice daily. Likes Astelin nasal spray if needed.  11/15/14- 84 yoF never smoker followed for allergic rhinitis, asthma. Hx  Anxiety FOLLOWS FOR: Pt states she is haivng fullness in her head with headaches; has noted she is under more stress lately form work,etc.  Changing jobs to what she hopes will be a less stressful situation. Currently under draft from an air conditioner at work. Complains of lightheadedness, sinus areas feel "heavy", rhinorrhea. Little cough or wheeze, no fever or purulent discharge and no sore throat. . Review of Systems-see HPI Constitutional:   No-   weight loss, night sweats, fevers, chills, fatigue, lassitude. HEENT:   + headaches, difficulty swallowing, tooth/dental problems, sore throat,       No-  sneezing, itching, +ear ache, +nasal congestion, +post nasal drip,  CV:  No-   chest pain, orthopnea, PND, swelling in lower extremities, anasarca, dizziness, palpitations Resp: +shortness of breath with exertion or at rest.              No-productive cough,  No non-productive cough,  No- coughing up of blood.              No-   change in color of mucus.  No recent  wheezing.   Skin: No-   rash or lesions. GI:  No-   heartburn, indigestion, abdominal pain, nausea, vomiting, GU:  MS:  No-   joint pain or swelling.   Neuro-     nothing unusual Psych:  No- change in mood or affect. No depression + anxiety.  No memory loss.  Objective:   Physical Exam General- Alert, Oriented, Affect-anxious/ talkative, Distress- none acute, trim, petite woman    Skin- rash-none, lesions- none, excoriation- none Lymphadenopathy- none Head- atraumatic            Eyes- Gross vision  intact, PERRLA, conjunctivae clear secretions            Ears- Hearing, canals normal. +Scarring or sclerosis TMs            Nose- +Narrow with turbinate edema, clear mucus bridging, No- Septal dev, polyps, erosion, perforation             Throat- Mallampati III , mucosa clear , drainage- none, tonsils- atrophic Neck- flexible , trachea midline, no stridor , thyroid nl, carotid no bruit Chest - symmetrical excursion , unlabored           Heart/CV- RRR , no murmur , no gallop  , no rub, nl s1 s2                           - JVD- none , edema- none, stasis changes- none, varices- none           Lung- clear to P&A, wheeze- none, cough- none , dullness-none, rub- none           Chest wall-  Abd-  Br/ Gen/ Rectal- Not done, not indicated Extrem- cyanosis- none, clubbing, none, atrophy- none, strength- nl Neuro-  grossly intact to observation

## 2014-11-15 NOTE — Patient Instructions (Signed)
Neb neo nasal  Depo 40  Get enough fluids

## 2014-11-16 ENCOUNTER — Telehealth: Payer: Self-pay | Admitting: Physician Assistant

## 2014-11-16 NOTE — Telephone Encounter (Signed)
Spoke with patient and she states she is having problems with left side bloating, deep pain that is tender to touch and is hot. States this started at the beginning of the week. She reports increase in soft stools and feeling light headed due to pain. She states she talked with Beth yesterday and thought she was sending her antibiotics but she got Levsin. She starts a new job Monday and is asking if Cecille Rubin Hvozdovic, PA-C will send her antibiotics. States Lori Hvozdovic, PA-C told her when she saw her last time she would do this. Please, advise.

## 2014-11-16 NOTE — Telephone Encounter (Signed)
Patient given recommendations. She states if it flares up again she will go to ED or urgent care.

## 2014-11-16 NOTE — Telephone Encounter (Signed)
Pt hasnt been seen. Needs cbc and esr. If pain bad, need to be seen here or in walk in.

## 2014-11-18 NOTE — Assessment & Plan Note (Signed)
Mild intermittent well-controlled without recent exacerbation

## 2014-11-18 NOTE — Assessment & Plan Note (Signed)
Not sure how much of her current complaint is related to anxiety. She would like to be treated for nonspecific rhinitis. Plan-nasal nebulizer decongestant, Depo-Medrol 40 mg

## 2014-11-27 ENCOUNTER — Ambulatory Visit: Payer: 59 | Admitting: Family Medicine

## 2014-11-30 NOTE — Telephone Encounter (Signed)
Levsin the only medication sent to pharmacy - 11/14/2014

## 2015-01-08 ENCOUNTER — Encounter: Payer: Self-pay | Admitting: Family Medicine

## 2015-01-08 ENCOUNTER — Ambulatory Visit (INDEPENDENT_AMBULATORY_CARE_PROVIDER_SITE_OTHER): Payer: 59 | Admitting: Family Medicine

## 2015-01-08 VITALS — BP 100/50 | HR 75 | Temp 97.9°F | Ht 59.0 in | Wt 99.4 lb

## 2015-01-08 DIAGNOSIS — R319 Hematuria, unspecified: Secondary | ICD-10-CM

## 2015-01-08 DIAGNOSIS — R5383 Other fatigue: Secondary | ICD-10-CM

## 2015-01-08 DIAGNOSIS — R0981 Nasal congestion: Secondary | ICD-10-CM | POA: Diagnosis not present

## 2015-01-08 DIAGNOSIS — E782 Mixed hyperlipidemia: Secondary | ICD-10-CM

## 2015-01-08 DIAGNOSIS — R1032 Left lower quadrant pain: Secondary | ICD-10-CM | POA: Diagnosis not present

## 2015-01-08 DIAGNOSIS — Z23 Encounter for immunization: Secondary | ICD-10-CM | POA: Diagnosis not present

## 2015-01-08 DIAGNOSIS — K219 Gastro-esophageal reflux disease without esophagitis: Secondary | ICD-10-CM

## 2015-01-08 DIAGNOSIS — E785 Hyperlipidemia, unspecified: Secondary | ICD-10-CM

## 2015-01-08 LAB — POCT URINALYSIS DIPSTICK
Bilirubin, UA: NEGATIVE
Glucose, UA: NEGATIVE
KETONES UA: NEGATIVE
LEUKOCYTES UA: NEGATIVE
Nitrite, UA: NEGATIVE
PH UA: 6
PROTEIN UA: NEGATIVE
Spec Grav, UA: 1.015
Urobilinogen, UA: 4

## 2015-01-08 MED ORDER — HYOSCYAMINE SULFATE 0.125 MG SL SUBL
SUBLINGUAL_TABLET | SUBLINGUAL | Status: DC
Start: 2015-01-08 — End: 2015-06-19

## 2015-01-08 MED ORDER — INFLUENZA VAC SPLIT QUAD 0.5 ML IM SUSY
0.5000 mL | PREFILLED_SYRINGE | INTRAMUSCULAR | Status: AC
  Administered 2015-01-08: 0.5 mL via INTRAMUSCULAR

## 2015-01-08 NOTE — Patient Instructions (Addendum)
Probiotics daily, [employer] or [employer] or online [place] 10 strain version can order [email or web address] Hydrate 64 oz daily Hyland's Leg cramp med as needed for leg cramps Can consider magnesium 200 mg if muscle  Muscle Cramps and Spasms Muscle cramps and spasms occur when a muscle or muscles tighten and you have no control over this tightening (involuntary muscle contraction). They are a common problem and can develop in any muscle. The most common place is in the calf muscles of the leg. Both muscle cramps and muscle spasms are involuntary muscle contractions, but they also have differences:   Muscle cramps are sporadic and painful. They may last a few seconds to a quarter of an hour. Muscle cramps are often more forceful and last longer than muscle spasms.  Muscle spasms may or may not be painful. They may also last just a few seconds or much longer. CAUSES  It is uncommon for cramps or spasms to be due to a serious underlying problem. In many cases, the cause of cramps or spasms is unknown. Some common causes are:   Overexertion.   Overuse from repetitive motions (doing the same thing over and over).   Remaining in a certain position for a long period of time.   Improper preparation, form, or technique while performing a sport or activity.   Dehydration.   Injury.   Side effects of some medicines.   Abnormally low levels of the salts and ions in your blood (electrolytes), especially potassium and calcium. This could happen if you are taking water pills (diuretics) or you are pregnant.  Some underlying medical problems can make it more likely to develop cramps or spasms. These include, but are not limited to:   Diabetes.   Parkinson disease.   Hormone disorders, such as thyroid problems.   Alcohol abuse.   Diseases specific to muscles, joints, and bones.   Blood vessel disease where not enough blood is getting to the muscles.  HOME  CARE INSTRUCTIONS   Stay well hydrated. Drink enough water and fluids to keep your urine clear or pale yellow.  It may be helpful to massage, stretch, and relax the affected muscle.  For tight or tense muscles, use a warm towel, heating pad, or hot shower water directed to the affected area.  If you are sore or have pain after a cramp or spasm, applying ice to the affected area may relieve discomfort.  Put ice in a plastic bag.  Place a towel between your skin and the bag.  Leave the ice on for 15-20 minutes, 03-04 times a day.  Medicines used to treat a known cause of cramps or spasms may help reduce their frequency or severity. Only take over-the-counter or prescription medicines as directed by your caregiver. SEEK MEDICAL CARE IF:  Your cramps or spasms get more severe, more frequent, or do not improve over time.  MAKE SURE YOU:   Understand these instructions.  Will watch your condition.  Will get help right away if you are not doing well or get worse. Document Released: 10/03/2001 Document Revised: 08/08/2012 Document Reviewed: 03/30/2012 [facility] Patient Information 2015 [place], [place]. This information is not intended to replace advice given to you by your health care provider. Make sure you discuss any questions you have with your health care provider.

## 2015-01-08 NOTE — Progress Notes (Signed)
Pre visit review using our clinic review tool, if applicable. No additional management support is needed unless otherwise documented below in the visit note. 

## 2015-01-11 LAB — URINE CULTURE

## 2015-01-14 ENCOUNTER — Other Ambulatory Visit: Payer: Self-pay | Admitting: Family Medicine

## 2015-01-14 DIAGNOSIS — N390 Urinary tract infection, site not specified: Secondary | ICD-10-CM

## 2015-01-20 ENCOUNTER — Encounter: Payer: Self-pay | Admitting: Family Medicine

## 2015-01-20 DIAGNOSIS — J302 Other seasonal allergic rhinitis: Secondary | ICD-10-CM

## 2015-01-20 DIAGNOSIS — Z23 Encounter for immunization: Secondary | ICD-10-CM | POA: Insufficient documentation

## 2015-01-20 DIAGNOSIS — J3089 Other allergic rhinitis: Secondary | ICD-10-CM

## 2015-01-20 DIAGNOSIS — E785 Hyperlipidemia, unspecified: Secondary | ICD-10-CM | POA: Insufficient documentation

## 2015-01-20 DIAGNOSIS — R319 Hematuria, unspecified: Secondary | ICD-10-CM | POA: Insufficient documentation

## 2015-01-20 HISTORY — DX: Other allergic rhinitis: J30.89

## 2015-01-20 HISTORY — DX: Other seasonal allergic rhinitis: J30.2

## 2015-01-20 NOTE — Assessment & Plan Note (Signed)
Increase rest and fluids, nasal saline, Zyrtec and Mucinex, call if symptoms worsen

## 2015-01-20 NOTE — Assessment & Plan Note (Signed)
Encouraged heart healthy diet, increase exercise, avoid trans fats, consider a krill oil cap daily 

## 2015-01-20 NOTE — Assessment & Plan Note (Signed)
No infection on testing, will repeat UA in 1-2 months

## 2015-01-20 NOTE — Progress Notes (Signed)
Subjective:    Patient ID: [name], female    DOB: [birth year], 49 y.o.   MRN: [id number]  Chief Complaint  Patient presents with  . Follow-up  . Abdominal Pain    HPI Patient is in today for follow-up. Has numerous concerns. One has been at she has seen some what she thinks is dark blood in her urine recently. Also notes some urinary urgency. Notes some mild increase in stool frequency with bowel movements twice a set of once daily but no bloody or tarry stool. Does have some intermittent lower abdominal pain more so on the left and some nausea occasionally. No vomiting. No fevers or chills. Has some persistent sinus congestion but no thick green sputum fevers or headaches are noted. Did have a sinusitis treated several months ago with good results. Denies CP/palp/SOB/HA/congestion/fevers/GI or GU c/o. Taking meds as prescribed  Past Medical History  Diagnosis Date  . Mitral valve disorders   . Contact dermatitis and eczema   . Esophageal reflux   . Lumbago   . IBS (irritable bowel syndrome)   . Anxiety   . Anemia   . IC (interstitial cystitis)   . Internal hemorrhoids   . Diverticulitis     CT Scan  . Asthma   . Hyperlipidemia, mixed   . Allergic rhinitis     Past Surgical History  Procedure Laterality Date  . Cesarean section    . Sinus surgery with instatrak    . Mouth surgery    . Dilitation & currettage/hystroscopy with novasure ablation N/A 10/14/2012    Procedure: DILATATION & CURETTAGE/HYSTEROSCOPY WITH NOVASURE ABLATION;  Surgeon: Lovenia Kim, MD;  Location: [facility];  Service: Gynecology;  Laterality: N/A;  . Bartholin cyst marsupialization N/A 12/18/2013    Procedure: BARTHOLIN CYST MARSUPIALIZATION WITH BIOPSY;  Surgeon: Lovenia Kim, MD;  Location: [facility];  Service: Gynecology;  Laterality: N/A;    Family History  Problem Relation Age of Onset  . Colon polyps Sister   . Mental illness Sister     anxiety from 9/11 survivors  . Varicose Veins  Sister   . Diabetes Mother   . Hypertension Mother   . Heart attack Mother   . Alcohol abuse Mother     smoker  . Heart disease Mother   . Dementia Mother   . Hypertension Father   . Hyperlipidemia Father   . Heart attack Father   . Cancer Father     lung cancer, smoker  . Alcohol abuse      Family history  . Colon cancer Neg Hx   . Cancer Brother     acute leukemia  . Dementia Maternal Grandmother   . Heart disease Maternal Grandfather     mi  . Cancer Paternal Grandmother     GYN cancer  . Other Sister     hypoglycemia  . Varicose Veins Sister   . Asthma Sister     rheumatoid  . Fibromyalgia Sister   . Mental retardation Sister     depression    Social History   Social History  . Marital Status: Married    Spouse Name: N/A  . Number of Children: N/A  . Years of Education: N/A   Occupational History  . [employer]     Social History Main Topics  . Smoking status: Never Smoker   . Smokeless tobacco: Never Used  . Alcohol Use: No  . Drug Use: No  . Sexual Activity: Yes  Comment: lives with husband and son, work with [employer] as a Clinical research associate, aoivd gluten, dairy   Other Topics Concern  . Not on file   Social History Narrative    Outpatient Prescriptions Prior to Visit  Medication Sig Dispense Refill  . ANUCORT-HC 25 MG suppository INSERT RECTALLY  1 SUPPOSITORY AT BEDTIME, FOR 7 DAYS (Patient not taking: Reported on 01/08/2015) 12 suppository 3  . benzonatate (TESSALON) 100 MG capsule Take 1 capsule (100 mg total) by mouth 2 (two) times daily as needed for cough. (Patient not taking: Reported on 11/15/2014) 20 capsule 0  . fluticasone (FLONASE) 50 MCG/ACT nasal spray Place 2 sprays into both nostrils daily. (Patient not taking: Reported on 01/08/2015) 16 g 0  . predniSONE (DELTASONE) 5 MG tablet Take 1 tablet (5 mg total) by mouth daily with breakfast. (Patient not taking: Reported on 11/15/2014) 5 tablet 0  . hyoscyamine (LEVSIN SL) 0.125 MG SL tablet Take 1 tablet  twice daily as needed (Patient not taking: Reported on 01/08/2015) 30 tablet 0   No facility-administered medications prior to visit.    Allergies  Allergen Reactions  . Aspirin Shortness Of Breath    "irritated stomach" Ibuprofen and Naproxen do not agree with her either  . Latex Shortness Of Breath and Itching  . Other Shortness Of Breath    peanuts  . Sulfamethoxazole-Trimethoprim Shortness Of Breath  . Sulfonamide Derivatives Shortness Of Breath and Itching  . Sulphadimidine Sodium [Sulfamethazine Sodium] Shortness Of Breath    Added to food for preservative  . Amoxicillin     Doesn't remember rxn  . Carisoprodol   . Ciprofloxacin     REACTION: SOB, "Tightness in head"  . Clarithromycin     "Doesn't agree with me"  . Codeine     Does not take percocet or hydrocodone; tylenol only  . Epinephrine     Heart racing  . Metronidazole Other (See Comments)    "cannot tolerate"  . Penicillins     Doesn't remember reaction    Review of Systems  Constitutional: Positive for malaise/fatigue. Negative for fever and chills.  HENT: Positive for congestion. Negative for hearing loss.   Eyes: Negative for discharge.  Respiratory: Negative for cough, sputum production and shortness of breath.   Cardiovascular: Negative for chest pain, palpitations and leg swelling.  Gastrointestinal: Positive for nausea and abdominal pain. Negative for heartburn, vomiting, diarrhea, constipation and blood in stool.  Genitourinary: Positive for urgency and hematuria. Negative for dysuria and frequency.  Musculoskeletal: Positive for back pain. Negative for myalgias and falls.  Skin: Negative for rash.  Neurological: Negative for dizziness, sensory change, loss of consciousness, weakness and headaches.  Endo/Heme/Allergies: Negative for environmental allergies. Does not bruise/bleed easily.  Psychiatric/Behavioral: Negative for depression and suicidal ideas. The patient is not nervous/anxious and does  not have insomnia.        Objective:    Physical Exam  Constitutional: She is oriented to person, place, and time. She appears well-developed and well-nourished. No distress.  HENT:  Head: Normocephalic and atraumatic.  Nose: Nose normal.  Eyes: Right eye exhibits no discharge. Left eye exhibits no discharge.  Neck: Normal range of motion. Neck supple.  Cardiovascular: Normal rate and regular rhythm.   No murmur heard. Pulmonary/Chest: Effort normal and breath sounds normal.  Abdominal: Soft. Bowel sounds are normal. There is no tenderness.  Musculoskeletal: She exhibits no edema.  Neurological: She is alert and oriented to person, place, and time.  Skin: Skin is warm and  dry.  Psychiatric: She has a normal mood and affect.  Nursing note and vitals reviewed.   BP 100/50 mmHg  Pulse 75  Temp(Src) 97.9 F (36.6 C) (Oral)  Ht 4\' 11"  (1.499 m)  Wt 99 lb 6.4 oz (45.088 kg)  BMI 20.07 kg/m2  SpO2 100%  LMP 01/07/2015 Wt Readings from Last 3 Encounters:  01/08/15 99 lb 6.4 oz (45.088 kg)  11/15/14 99 lb 12.8 oz (45.269 kg)  10/24/14 100 lb 9.6 oz (45.632 kg)     Lab Results  Component Value Date   WBC 5.5 10/24/2014   HGB 14.1 10/24/2014   HCT 41.0 10/24/2014   PLT 274.0 10/24/2014   GLUCOSE 85 10/24/2014   CHOL 176 05/29/2014   TRIG 93.0 05/29/2014   HDL 52.70 05/29/2014   LDLDIRECT 142.7 03/02/2012   LDLCALC 105* 05/29/2014   ALT 13 10/24/2014   AST 18 10/24/2014   NA 137 10/24/2014   K 3.6 10/24/2014   CL 103 10/24/2014   CREATININE 0.60 10/24/2014   BUN 16 10/24/2014   CO2 30 10/24/2014   TSH 1.73 05/29/2014   HGBA1C 4.8 12/12/2010    Lab Results  Component Value Date   TSH 1.73 05/29/2014   Lab Results  Component Value Date   WBC 5.5 10/24/2014   HGB 14.1 10/24/2014   HCT 41.0 10/24/2014   MCV 98.9 10/24/2014   PLT 274.0 10/24/2014   Lab Results  Component Value Date   NA 137 10/24/2014   K 3.6 10/24/2014   CO2 30 10/24/2014   GLUCOSE 85  10/24/2014   BUN 16 10/24/2014   CREATININE 0.60 10/24/2014   BILITOT 0.6 10/24/2014   ALKPHOS 65 10/24/2014   AST 18 10/24/2014   ALT 13 10/24/2014   PROT 7.3 10/24/2014   ALBUMIN 4.1 10/24/2014   CALCIUM 9.1 10/24/2014   GFR 112.70 10/24/2014   Lab Results  Component Value Date   CHOL 176 05/29/2014   Lab Results  Component Value Date   HDL 52.70 05/29/2014   Lab Results  Component Value Date   LDLCALC 105* 05/29/2014   Lab Results  Component Value Date   TRIG 93.0 05/29/2014   Lab Results  Component Value Date   CHOLHDL 3 05/29/2014   Lab Results  Component Value Date   HGBA1C 4.8 12/12/2010       Assessment & Plan:   Problem List Items Addressed This Visit    Need for prophylactic vaccination and inoculation against influenza - Primary   Relevant Medications   Influenza vac split quadrivalent PF (FLUARIX) injection 0.5 mL   Other Relevant Orders   TSH   CBC   Comprehensive metabolic panel   Magnesium   Hyperlipidemia, mixed    Encouraged heart healthy diet, increase exercise, avoid trans fats, consider a krill oil cap daily      Hyperlipidemia, mild    Encouraged heart healthy diet, increase exercise, avoid trans fats, consider a krill oil cap daily      Relevant Orders   Lipid Profile   Hematuria    No infection on testing, will repeat UA in 1-2 months      Relevant Orders   Urine Culture (Completed)   Head congestion    Increase rest and fluids, nasal saline, Zyrtec and Mucinex, call if symptoms worsen      Relevant Orders   TSH   CBC   Comprehensive metabolic panel   Magnesium   Fatigue   Relevant Orders   TSH  CBC   Comprehensive metabolic panel   Magnesium   ESOPHAGEAL REFLUX    Avoid offending foods, start probiotics. Do not eat large meals in late evening and consider raising head of bed.       Relevant Medications   hyoscyamine (LEVSIN SL) 0.125 MG SL tablet    Other Visit Diagnoses    Left lower quadrant pain          Relevant Medications    hyoscyamine (LEVSIN SL) 0.125 MG SL tablet    Other Relevant Orders    TSH    CBC    Comprehensive metabolic panel    Magnesium    POCT Urinalysis Dipstick (Completed)       I am having Ms. Conti maintain her fluticasone, predniSONE, benzonatate, ANUCORT-HC, and hyoscyamine. We will continue to administer Influenza vac split quadrivalent PF.  Meds ordered this encounter  Medications  . Influenza vac split quadrivalent PF (FLUARIX) injection 0.5 mL    Sig:   . hyoscyamine (LEVSIN SL) 0.125 MG SL tablet    Sig: Take 1 tablet twice daily as needed    Dispense:  60 tablet    Refill:  1     BLYTH, STACEY, MD

## 2015-01-20 NOTE — Assessment & Plan Note (Signed)
Avoid offending foods, start probiotics. Do not eat large meals in late evening and consider raising head of bed.  

## 2015-01-29 ENCOUNTER — Ambulatory Visit: Payer: 59 | Admitting: Family Medicine

## 2015-01-29 ENCOUNTER — Telehealth: Payer: Self-pay | Admitting: Family Medicine

## 2015-01-29 NOTE — Telephone Encounter (Signed)
Billing Issue - Pt had appt 01/08/15 and provided new insurance card when she came in. She received a self pay bill. I pulled ins card that was scanned same day, verified, and gave to front desk to input. Pt would like [place] to have the bills resubmitted to [facility] insurance.  Pt concerns - When she got her flu shot it hurt and she said the CMA was down in the muscle and it hurts. She was bleeding afterwards. Pt said she knew it was done wrong. She had issues with labs being drawn. BP taken wrong by CMA and shows high. Doctor has to retake it and it shows as normal. She is discouraged with services received in our office.  She is requesting f/u from manager regarding these concerns.

## 2015-02-01 ENCOUNTER — Other Ambulatory Visit: Payer: Self-pay

## 2015-04-26 ENCOUNTER — Telehealth: Payer: Self-pay | Admitting: Internal Medicine

## 2015-04-26 NOTE — Telephone Encounter (Signed)
Called and spoke with pt's husband. Informed him of MW's recs. Pt's husband voiced understanding and had no further questions.

## 2015-04-26 NOTE — Telephone Encounter (Signed)
Unfortunately she has way too many allergies to abx to try to treat over the phone - will need to go to UC for eval if can't wait until office opens again

## 2015-04-26 NOTE — Telephone Encounter (Signed)
LVM for pt to return call

## 2015-04-26 NOTE — Telephone Encounter (Signed)
Last ov with CY   Patient Instructions       Neb neo nasal  Depo 40  Get enough fluids   Called and spoke with pt. She c/o sinus congestion, occasional dry cough, chest heaviness, and nausea at times x 2 weeks. Denies any sob, wheezing, fever or nausea. Pt states she has been taking a OTC decongestant and cough syrup. She request an antibiotic called into [facility] off of [address]. Pt voiced understanding and had no further questions.   MW please advise

## 2015-04-27 ENCOUNTER — Ambulatory Visit (INDEPENDENT_AMBULATORY_CARE_PROVIDER_SITE_OTHER): Payer: 59 | Admitting: Internal Medicine

## 2015-04-27 ENCOUNTER — Encounter: Payer: Self-pay | Admitting: Internal Medicine

## 2015-04-27 VITALS — BP 108/58 | Temp 97.8°F | Wt 100.0 lb

## 2015-04-27 DIAGNOSIS — J3489 Other specified disorders of nose and nasal sinuses: Secondary | ICD-10-CM

## 2015-04-27 DIAGNOSIS — R0981 Nasal congestion: Secondary | ICD-10-CM

## 2015-04-27 LAB — POCT URINALYSIS DIPSTICK
BILIRUBIN UA: NEGATIVE
GLUCOSE UA: NEGATIVE
Ketones, UA: NEGATIVE
Leukocytes, UA: NEGATIVE
NITRITE UA: NEGATIVE
Protein, UA: NEGATIVE
SPEC GRAV UA: 1.01
Urobilinogen, UA: 0.2
pH, UA: 6.5

## 2015-04-27 NOTE — Progress Notes (Signed)
HPI  Pt presents to the clinic today with c/o right side facial pressure and nasal congestion. This has been going on 4-5 weeks ago. She is not blowing anything out of her nose. She denies sore throat and cough. She denies fever, chills or body aches. She has taken Claritin D with minimal relief. She does have a history of allergies and asthma. She has not had sick contacts that she is aware.  Of note, she has multiple other complaints that were not addressed today, including muscle cramps in her legs, joint pain in her hands, a burning sensation in her trapezius. I did advise her to follow up with her PCP with these concerns.  Review of Systems    Past Medical History  Diagnosis Date  . Mitral valve disorders   . Contact dermatitis and eczema   . Esophageal reflux   . Lumbago   . IBS (irritable bowel syndrome)   . Anxiety   . Anemia   . IC (interstitial cystitis)   . Internal hemorrhoids   . Diverticulitis     CT Scan  . Asthma   . Hyperlipidemia, mixed   . Allergic rhinitis     Family History  Problem Relation Age of Onset  . Colon polyps Sister   . Mental illness Sister     anxiety from 9/11 survivors  . Varicose Veins Sister   . Diabetes Mother   . Hypertension Mother   . Heart attack Mother   . Alcohol abuse Mother     smoker  . Heart disease Mother   . Dementia Mother   . Hypertension Father   . Hyperlipidemia Father   . Heart attack Father   . Cancer Father     lung cancer, smoker  . Alcohol abuse      Family history  . Colon cancer Neg Hx   . Cancer Brother     acute leukemia  . Dementia Maternal Grandmother   . Heart disease Maternal Grandfather     mi  . Cancer Paternal Grandmother     GYN cancer  . Other Sister     hypoglycemia  . Varicose Veins Sister   . Asthma Sister     rheumatoid  . Fibromyalgia Sister   . Mental retardation Sister     depression    Social History   Social History  . Marital Status: Married    Spouse Name: N/A  .  Number of Children: N/A  . Years of Education: N/A   Occupational History  . [employer]     Social History Main Topics  . Smoking status: Never Smoker   . Smokeless tobacco: Never Used  . Alcohol Use: No  . Drug Use: No  . 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 Not on file   Social History Narrative    Allergies  Allergen Reactions  . Aspirin Shortness Of Breath    "irritated stomach" Ibuprofen and Naproxen do not agree with her either  . Latex Shortness Of Breath and Itching  . Other Shortness Of Breath    peanuts  . Sulfamethoxazole-Trimethoprim Shortness Of Breath  . Sulfonamide Derivatives Shortness Of Breath and Itching  . Sulphadimidine Sodium [Sulfamethazine Sodium] Shortness Of Breath    Added to food for preservative  . Amoxicillin     Doesn't remember rxn  . Carisoprodol   . Ciprofloxacin  REACTION: SOB, "Tightness in head"  . Clarithromycin     "Doesn't agree with me"  . Codeine     Does not take percocet or hydrocodone; tylenol only  . Epinephrine     Heart racing  . Metronidazole Other (See Comments)    "cannot tolerate"  . Penicillins     Doesn't remember reaction     Constitutional: Positive headache. Denies fatigue, fever or abrupt weight changes.  HEENT:  Positive facial pain, nasal congestion. Denies eye redness, ear pain, ringing in the ears, wax buildup, runny nose or sore throat. Respiratory: Denies cough, difficulty breathing or shortness of breath.  Cardiovascular: Denies chest pain, chest tightness, palpitations or swelling in the hands or feet.   No other specific complaints in a complete review of systems (except as listed in HPI above).  Objective:   BP 108/58 mmHg  Temp(Src) 97.8 F (36.6 C) (Oral)  Wt 100 lb (45.36 kg)  General: Appears her stated age, well developed, well nourished in NAD. HEENT: Head: normal shape and size, no  sinus tenderness noted; Eyes: sclera white, no icterus, conjunctiva pink; Ears: Tm's pink but intact, normal light reflex; Nose: mucosa pink and moist, septum midline; Throat/Mouth: + PND. Teeth present, mucosa pink and moist, no exudate noted, no lesions or ulcerations noted.  Neck:  Cervical adenopathy noted on the right.  Cardiovascular: Normal rate and rhythm. S1,S2 noted.  No murmur, rubs or gallops noted.  Pulmonary/Chest: Normal effort and positive vesicular breath sounds. No respiratory distress. No wheezes, rales or ronchi noted.      Assessment & Plan:   Sinus pressure and nasal congestion  Can use a Neti Pot which can be purchased from your local drug store. Flonase 2 sprays each nostril for 3 days and then daily as needed. Follow up with PCP if no improvement  RTC as needed or if symptoms persist.

## 2015-04-27 NOTE — Patient Instructions (Signed)

## 2015-05-10 ENCOUNTER — Telehealth: Payer: Self-pay | Admitting: Family Medicine

## 2015-05-10 MED ORDER — FLUTICASONE PROPIONATE 50 MCG/ACT NA SUSP: 2.0000 | Freq: Every day | NASAL | Status: DC

## 2015-05-10 NOTE — Telephone Encounter (Signed)
Prescription sent in as requested. Called the patient left msg. Refill done

## 2015-05-10 NOTE — Telephone Encounter (Signed)
Patient wanted to be scheduled at [facility] for Saturday but didn't want a morning appt. Checked several times to see how far the schedule for Saturday had progressed. @ 4:30 pm the schedule was still at only 10:45. Left message for patient stating the latest appt for Saturday

## 2015-05-10 NOTE — Telephone Encounter (Signed)
Caller name: Self   Can be reached: [phone]   Pharmacy:  [place], [place] [phone] (Phone) [phone] (Fax)        Reason for call: Requesting refill on fluticasone (FLONASE) 50 MCG/ACT nasal spray [id number]. States she was seen at [facility] last Saturday and was told they were calling in a rx for Flonase, but it wasn't called in. She also wants to know if there is something else she can take

## 2015-05-30 ENCOUNTER — Telehealth: Payer: Self-pay | Admitting: Internal Medicine

## 2015-05-30 ENCOUNTER — Ambulatory Visit (INDEPENDENT_AMBULATORY_CARE_PROVIDER_SITE_OTHER): Payer: 59 | Admitting: Internal Medicine

## 2015-05-30 ENCOUNTER — Ambulatory Visit: Payer: 59 | Admitting: Family Medicine

## 2015-05-30 ENCOUNTER — Encounter: Payer: Self-pay | Admitting: Internal Medicine

## 2015-05-30 VITALS — BP 116/72 | HR 71 | Ht 59.0 in | Wt 100.2 lb

## 2015-05-30 DIAGNOSIS — J069 Acute upper respiratory infection, unspecified: Secondary | ICD-10-CM

## 2015-05-30 DIAGNOSIS — J209 Acute bronchitis, unspecified: Secondary | ICD-10-CM | POA: Diagnosis not present

## 2015-05-30 MED ORDER — BENZONATATE 100 MG PO CAPS: ORAL_CAPSULE | ORAL | Status: DC

## 2015-05-30 MED ORDER — METHYLPREDNISOLONE ACETATE 80 MG/ML IJ SUSP
40.0000 mg | Freq: Once | INTRAMUSCULAR | Status: AC
  Administered 2015-05-30: 40 mg via INTRAMUSCULAR

## 2015-05-30 MED ORDER — AZITHROMYCIN 250 MG PO TABS: ORAL_TABLET | ORAL | Status: DC

## 2015-05-30 NOTE — Telephone Encounter (Signed)
Patient says she has had upper respiratory infection, crackling in chest x 1 month.  She feels weak, tired, light headed, sinus congested still and she says she feels crackling in her chest when she lays down to sleep.  She wants to know if she can come in to be seen today.  Asked Joellen Jersey and she said patient could come today at 3:45pm.  Scheduled patient at 3:45pm.  Patient aware of appointment. Nothing further needed.

## 2015-05-30 NOTE — Assessment & Plan Note (Signed)
There is an anxiety component but I think she is describing an indolent upper respiratory infection with tracheobronchitis. The sister at [facility] long enough that we discussed options and agreed to try a little cortisone and a Z-Pak. Fluids and symptomatic care emphasized.

## 2015-05-30 NOTE — Patient Instructions (Addendum)
Scripts sent for benzonatate perles for cough and Z pak antibiotic  Stay hydrated, get enough rest and take care of yourself  Depo 40   For dx acute bronchitis  Ok to keep appointment April 6

## 2015-05-30 NOTE — Progress Notes (Signed)
Patient ID: [name], female    DOB: 1965/06/13, 50 y.o.   MRN: [id number]  HPI 11/21/10 Hosp 1 year ago- dehydration, heat exhaustion, low iron level.  Thinks chronic singulair is bothering stomach. After long time use, then a hiatus, she took one and had diarrhea.  Does saline nasal rinse/ squeeze bottle every morning.  Feels tightness in chest off and on. Some dry cough and tight chest. Always very sensitive to stimulants, including Atrovent HFA, because of tachypalpitation she relates to mitral prolapse.   03/04/11-  61 yoF never smoker returning to this area after last seen 2009. Hx allergic rhinitis, asthma, anxiety. She has had flu vaccine. Has a new job doing Journalist, newspaper. Cold air is tightening in her chest some when she goes outdoors. Occasional dry, tight cough. We have prescribed a nebulizer machine but she turned back in because her insurance wouldn't cover it. She is taking Singulair 4 mg every other day and emphasizes her intolerance to many medications. Minor epistaxis, using saline gel. Still has some left over Tilade nebulizer solution but we discussed options. She volunteers that she is now recognizing the importance of anxiety with many of her respiratory symptoms. PFT 11/26/2010 within normal limits within significant response to bronchodilator. FEV1/FVC 0.79. Results were reviewed with her, including a lengthy discussion of the significance of the different subtests.  08/26/11 -54 yoF never smoker returning to this area after last seen 2009. Hx allergic rhinitis, asthma, anxiety. " breathing too well"; saw Dr Arnoldo Morale last week and breathing heavy. unsure if due to exercise or weather Joined a gym class for exercise. Using Qnasl which she understood to be for nose and chest. Has an old nebulizer machine. She credits Singulair with helping reduce her "inflammation". Little cough, wheeze, phlegm.  02/24/12- 8 yoF never smoker returning to this area after last seen 2009.  Hx allergic rhinitis, asthma, anxiety. Clear colored mucus; congestion since last visit 2 weeks ago diverticulitis was treated with doxycycline without surgery. Told by GI that she has GERD. Has had recent nasal congestion some nose bleeding and head "feels heavy". Minor cough with clear mucus. Qnasl caused malaise. Off of Singulair saying 5 mg dose was better for her stomach.  08/25/12- 12 yoF never smoker followed for allergic rhinitis, asthma. Hx  anxiety. FOLLOWS FOR: pt states that breathing is acting up-for about 1 week having sinus pressure; feels  like throat is full and bringing up colored phelgm. Mucinex caused nausea. Taking Alavert and Children's Sudafed which we discussed. Throat feels raspy with clear mucus.  03/01/13- 52 yoF never smoker followed for allergic rhinitis, asthma. Hx  Anxiety. FOLLOWS FOR: alot of pressure on left side of head, congestion.  Left frontal headache for the past 7-10 days with a left maxillary sinus pressure. She questions acute sinusitis. Little discharge, some clear watery rhinorrhea. Using Sudafed every other day, Advil. Not using Qnasl. Chest feels clear.  05/04/13- 64 yoF never smoker followed for allergic rhinitis, asthma. Hx  Anxiety. FOLLOWS FOR: States that she was given doxycycline last week by [place] for URI. Now infection has moved to her chest. Has lots of chest congestion and coughing. Mother died-autopsy pending We had sent doxycycline. Sinus is now clear. Still some postnasal drip and episodes of hard coughing with clear mucus.Loratadine helps more than other products tried.  07/24/13- 48 yoF never smoker followed for allergic rhinitis, asthma. Hx  Anxiety. ACUTE VISIT: Left ear(pressure and achy) and jaw pain-can't open mouth all the way;  crackle sounds(noticed when doing yoga). Completed Zpak last night; nausea feelings as well. Notices crackles in chest when lying down on her back. Recognized she was clenching her teeth after a trip to  [place]-husband"s driving made her anxious.  07/26/14- 48 yoF never smoker followed for allergic rhinitis, asthma. Hx  Anxiety. FOLLOWS FOR: headache, increased allergy symptoms Normal Ba swallow 03/12/14 Taking one half Alavert tablet twice daily to control rhinitis and minimize dryness. Neti pot once or twice daily. Likes Astelin nasal spray if needed.  11/15/14- 51 yoF never smoker followed for allergic rhinitis, asthma. Hx  Anxiety FOLLOWS FOR: Pt states she is haivng fullness in her head with headaches; has noted she is under more stress lately form work,etc.  Changing jobs to what she hopes will be a less stressful situation. Currently under draft from an air conditioner at work. Complains of lightheadedness, sinus areas feel "heavy", rhinorrhea. Little cough or wheeze, no fever or purulent discharge and no sore throat.  05/30/2015-50 year old female never smoker followed for allergic rhinitis, asthma. History anxiety ACUTE VISIT: Increased heaad pressure, dizzy spells; heavy breathing-crackles in chest when lying daown at night. Ears will stop up at times; dry cough, fatigued,nausea as well. Reports malaise over the past month. No energy, bitemporal headaches off and on, some postnasal drainage. Initially some sore throat. Mild intermittent dry cough. No fever. Lying down sometimes if quiet she thinks she hears her chest crackle without active wheeze. Her PCP gave Flonase which maybe helped a little. Scant nasal discharge and expectorated sputum are yellow at times.  Review of Systems-see HPI Constitutional:   No-   weight loss, night sweats, fevers, chills, fatigue, lassitude. HEENT:   + headaches, difficulty swallowing, tooth/dental problems, sore throat,       No-  sneezing, itching, +ear ache, +nasal congestion, +post nasal drip,  CV:  No-   chest pain, orthopnea, PND, swelling in lower extremities, anasarca, dizziness, palpitations Resp: +shortness of breath with exertion or at rest.              + cough,  No non-productive cough,  No- coughing up of blood.              +   change in color of mucus.  No recent  wheezing.   Skin: No-   rash or lesions. GI:  No-   heartburn, indigestion, abdominal pain, nausea, vomiting, GU:  MS:  No-   joint pain or swelling.   Neuro-     nothing unusual Psych:  No- change in mood or affect. No depression + anxiety.  No memory loss.  Objective:   Physical Exam General- Alert, Oriented, Affect-anxious/ talkative, Distress- none acute, trim, petite woman    Skin- rash-none, lesions- none, excoriation- none Lymphadenopathy- none Head- atraumatic            Eyes- Gross vision intact, PERRLA, conjunctivae clear secretions            Ears- Hearing, canals normal. +Scarring or sclerosis TMs            Nose- +Narrow with turbinate edema, clear mucus bridging, No- Septal dev, polyps, erosion, perforation             Throat- Mallampati III , mucosa clear , drainage- none, tonsils- atrophic Neck- flexible , trachea midline, no stridor , thyroid nl, carotid no bruit Chest - symmetrical excursion , unlabored           Heart/CV- RRR , no murmur , no gallop  ,  no rub, nl s1 s2                           - JVD- none , edema- none, stasis changes- none, varices- none           Lung- clear to P&A, wheeze- none, cough- none , dullness-none, rub- none           Chest wall-  Abd-  Br/ Gen/ Rectal- Not done, not indicated Extrem- cyanosis- none, clubbing, none, atrophy- none, strength- nl Neuro- grossly intact to observation

## 2015-06-19 ENCOUNTER — Encounter: Payer: Self-pay | Admitting: Family Medicine

## 2015-06-19 ENCOUNTER — Ambulatory Visit (INDEPENDENT_AMBULATORY_CARE_PROVIDER_SITE_OTHER): Payer: 59 | Admitting: Family Medicine

## 2015-06-19 ENCOUNTER — Ambulatory Visit: Payer: 59 | Admitting: Physician Assistant

## 2015-06-19 VITALS — BP 122/72 | HR 92 | Temp 99.2°F | Ht 59.0 in | Wt 98.4 lb

## 2015-06-19 DIAGNOSIS — R6889 Other general symptoms and signs: Secondary | ICD-10-CM | POA: Diagnosis not present

## 2015-06-19 MED ORDER — OSELTAMIVIR PHOSPHATE 75 MG PO CAPS: 75.0000 mg | ORAL_CAPSULE | Freq: Two times a day (BID) | ORAL | Status: DC

## 2015-06-19 NOTE — Patient Instructions (Addendum)
I suspect that you have the flu Start on the tamiflu asap- take it twice a day for 5 days Rest and drink plenty of fluids Tylenol will be helpful for your headache and body aches  Let me know if you do not feel better in the next few days- Sooner if worse.

## 2015-06-19 NOTE — Progress Notes (Signed)
[place] at [facility] [address], [place], [place] [address] [phone]  Date:  06/19/2015   Name:  [name]   DOB:  09/22/1965   MRN:  [id number]  PCP:  Penni Homans, MD    Chief Complaint: Cough and Headache   History of Present Illness:  [name] is a 50 y.o. very pleasant female patient who presents with the following:  Generally healthy woman here today with illness Her son was recently ill with a viral illness, as is her husband She first got sick yesterday, her sx came onto her quickly.   She has felt very tired, achy all over, nausea at times, cough, headache She felt hot this am- took some tylenol.  Did not check her temperature  No vomiting or diarrhea She does have IBS, interstitial cystitis, anxiety  Patient Active Problem List   Diagnosis Date Noted  . Acute upper respiratory infection 05/30/2015  . Hematuria 01/20/2015  . Need for prophylactic vaccination and inoculation against influenza 01/20/2015  . Hyperlipidemia, mild 01/20/2015  . Head congestion 01/20/2015  . Fatigue 10/24/2014  . Chest pressure 10/24/2014  . Sinusitis, acute maxillary 10/11/2014  . Hyperlipidemia, mixed 06/03/2014  . Preventative health care 06/03/2014  . TMJ arthralgia 08/20/2013  . Diverticulosis 07/01/2011  . Perimenopausal 03/25/2011  . Fibroids 10/24/2010  . FLATULENCE-GAS-BLOATING 05/26/2010  . Anemia 03/25/2010  . IRRITABLE BOWEL SYNDROME 03/25/2010  . MITRAL VALVE PROLAPSE 07/09/2007  . Asthma, exogenous 07/09/2007  . ESOPHAGEAL REFLUX 07/09/2007  . DERMATITIS 07/09/2007  . Allergic rhinitis due to pollen 12/14/2006  . LOW BACK PAIN 12/14/2006    Past Medical History  Diagnosis Date  . Mitral valve disorders   . Contact dermatitis and eczema   . Esophageal reflux   . Lumbago   . IBS (irritable bowel syndrome)   . Anxiety   . Anemia   . IC (interstitial cystitis)   . Internal hemorrhoids    . Diverticulitis     CT Scan  . Asthma   . Hyperlipidemia, mixed   . Allergic rhinitis     Past Surgical History  Procedure Laterality Date  . Cesarean section    . Sinus surgery with instatrak    . Mouth surgery    . Dilitation & currettage/hystroscopy with novasure ablation N/A 10/14/2012    Procedure: DILATATION & CURETTAGE/HYSTEROSCOPY WITH NOVASURE ABLATION;  Surgeon: Lovenia Kim, MD;  Location: [facility];  Service: Gynecology;  Laterality: N/A;  . Bartholin cyst marsupialization N/A 12/18/2013    Procedure: BARTHOLIN CYST MARSUPIALIZATION WITH BIOPSY;  Surgeon: Lovenia Kim, MD;  Location: [facility];  Service: Gynecology;  Laterality: N/A;    Social History  Substance Use Topics  . Smoking status: Never Smoker   . Smokeless tobacco: Never Used  . Alcohol Use: No    Family History  Problem Relation Age of Onset  . Colon polyps Sister   . Mental illness Sister     anxiety from 9/11 survivors  . Varicose Veins Sister   . Diabetes Mother   . Hypertension Mother   . Heart attack Mother   . Alcohol abuse Mother     smoker  . Heart disease Mother   . Dementia Mother   . Hypertension Father   . Hyperlipidemia Father   . Heart attack Father   . Cancer Father     lung cancer, smoker  . Alcohol abuse      Family  history  . Colon cancer Neg Hx   . Cancer Brother     acute leukemia  . Dementia Maternal Grandmother   . Heart disease Maternal Grandfather     mi  . Cancer Paternal Grandmother     GYN cancer  . Other Sister     hypoglycemia  . Varicose Veins Sister   . Asthma Sister     rheumatoid  . Fibromyalgia Sister   . Mental retardation Sister     depression    Allergies  Allergen Reactions  . Aspirin Shortness Of Breath    "irritated stomach" Ibuprofen and Naproxen do not agree with her either  . Latex Shortness Of Breath and Itching  . Other Shortness Of Breath    peanuts  . Sulfamethoxazole-Trimethoprim Shortness Of Breath  . Sulfonamide  Derivatives Shortness Of Breath and Itching  . Sulphadimidine Sodium [Sulfamethazine Sodium] Shortness Of Breath    Added to food for preservative  . Amoxicillin     Doesn't remember rxn  . Carisoprodol   . Ciprofloxacin     REACTION: SOB, "Tightness in head"  . Clarithromycin     "Doesn't agree with me"  . Codeine     Does not take percocet or hydrocodone; tylenol only  . Epinephrine     Heart racing  . Metronidazole Other (See Comments)    "cannot tolerate"  . Penicillins     Doesn't remember reaction    Medication list has been reviewed and updated.  Current Outpatient Prescriptions on File Prior to Visit  Medication Sig Dispense Refill  . azithromycin (ZITHROMAX) 250 MG tablet 2 today then one daily 6 tablet 0  . fluticasone (FLONASE) 50 MCG/ACT nasal spray Place 2 sprays into both nostrils daily. 16 g 2   No current facility-administered medications on file prior to visit.    Review of Systems:  As per HPI- otherwise negative.   Physical Examination: Filed Vitals:   06/19/15 1608  BP: 122/72  Pulse: 92  Temp: 99.2 F (37.3 C)   Filed Vitals:   06/19/15 1608  Height: 4\' 11"  (1.499 m)  Weight: 98 lb 6.4 oz (44.634 kg)   Body mass index is 19.86 kg/(m^2). Ideal Body Weight: Weight in (lb) to have BMI = 25: 123.5  GEN: WDWN, NAD, Non-toxic, A & O x 3, petite build, looks well HEENT: Atraumatic, Normocephalic. Neck supple. No masses, No LAD.  Bilateral TM wnl, oropharynx normal.  PEERL,EOMI.   Ears and Nose: No external deformity. CV: RRR, No M/G/R. No JVD. No thrill. No extra heart sounds. PULM: CTA B, no wheezes, crackles, rhonchi. No retractions. No resp. distress. No accessory muscle use. ABD: S, NT, ND EXTR: No c/c/e NEURO Normal gait.  PSYCH: Normally interactive. Conversant. Not depressed or anxious appearing.  Calm demeanor.   Negative rapid flu test today Assessment and Plan: Flu-like symptoms - Plan: oseltamivir (TAMIFLU) 75 MG capsule  Here  today with flu like illness; suspect flu although her rapid test is negative.  Suggested CBC for further information but she declines at this time We will start on tamiflu today, discussed supportive care She will follow-up if not feeling better soon  Meds ordered this encounter  Medications  . oseltamivir (TAMIFLU) 75 MG capsule    Sig: Take 1 capsule (75 mg total) by mouth 2 (two) times daily.    Dispense:  10 capsule    Refill:  0    Signed Lamar Blinks, MD

## 2015-06-20 ENCOUNTER — Telehealth: Payer: Self-pay | Admitting: Family Medicine

## 2015-06-20 NOTE — Telephone Encounter (Signed)
OK to rx Astelin 2 sprays each nostril daily. Disp #1

## 2015-06-20 NOTE — Telephone Encounter (Signed)
Caller name: Castiel   Relationship to patient: Self  Can be reached: [phone]  Pharmacy: [facility]  Reason for call: Pt says that she was prescribed Tamiflu. She would like to make the provider aware that the pharmacy was out of Tamiflu so she bought something otc. She would like to know if provider would call her in a nasal spray Astlaine. She says that it will help with the nasal congestion.

## 2015-06-21 ENCOUNTER — Telehealth: Payer: Self-pay | Admitting: Family Medicine

## 2015-06-21 MED ORDER — AZELASTINE HCL 0.1 % NA SOLN: 2.0000 | Freq: Two times a day (BID) | NASAL | Status: DC

## 2015-06-21 MED ORDER — HYDROCODONE-HOMATROPINE 5-1.5 MG/5ML PO SYRP: 5.0000 mL | ORAL_SOLUTION | Freq: Four times a day (QID) | ORAL | Status: DC | PRN

## 2015-06-21 NOTE — Telephone Encounter (Signed)
Caller name:Mon[place] Relationship to patient: Can be reached:[phone] Pharmacy:  Reason for call:Please [facility] new [facility] [address] for her nose spray  The pharmacy she uses did not have the generic and the name brand is $50 dollars  Also she is coughing so much she feels like she will throw up.  She would like a generic cough medicine called in to the same [facility]

## 2015-06-21 NOTE — Telephone Encounter (Signed)
Resent nasal spray to [facility]. Please advise on cough medication request below.

## 2015-06-21 NOTE — Telephone Encounter (Signed)
Sent in presription.  Patient notified.

## 2015-06-21 NOTE — Telephone Encounter (Signed)
Had to print the cough syrup. Patient contacted and will try and come by to pickup by 5, if not will have her husband pickup on Monday. She is doing a little better the pharmacist had her add sudafed along with the nasal sprays to help dry up the congestion and cough a little better.

## 2015-06-21 NOTE — Telephone Encounter (Signed)
She has flonase already there is no other nose spray except the generic of Astelin can add nasal saline tid, can have Hydromet cough syrup but it does have some mild narcotic in it so could make her sleepy. 1 tsp po tid prn cough. Disp 120 ml

## 2015-07-02 ENCOUNTER — Ambulatory Visit: Payer: 59 | Admitting: Family Medicine

## 2015-07-04 ENCOUNTER — Ambulatory Visit (INDEPENDENT_AMBULATORY_CARE_PROVIDER_SITE_OTHER): Payer: 59 | Admitting: Family Medicine

## 2015-07-04 ENCOUNTER — Encounter: Payer: Self-pay | Admitting: Family Medicine

## 2015-07-04 VITALS — BP 105/80 | HR 91 | Temp 99.0°F | Ht 59.0 in | Wt 100.0 lb

## 2015-07-04 DIAGNOSIS — Z8249 Family history of ischemic heart disease and other diseases of the circulatory system: Secondary | ICD-10-CM | POA: Diagnosis not present

## 2015-07-04 DIAGNOSIS — R0789 Other chest pain: Secondary | ICD-10-CM

## 2015-07-04 NOTE — Patient Instructions (Signed)
It was good to see you today Use the tessalon perles as needed for cough We will refer you to see cardiology just to make sure that all is well Let us know if you do not feel better soon!

## 2015-07-04 NOTE — Progress Notes (Signed)
Pre visit review using our clinic review tool, if applicable. No additional management support is needed unless otherwise documented below in the visit note. 

## 2015-07-04 NOTE — Progress Notes (Signed)
[facility] [address], [place], [place] [address] [phone]  Date:  07/04/2015   Name:  [name]   DOB:  03-Jun-1965   MRN:  [id number]  PCP:  Mallory Homans, MD    Chief Complaint: Follow-up   History of Present Illness:  [name] is a 50 y.o. very pleasant female patient who presents with the following:  Seen by myself on 2/22 with 1 day of illness- aches, tired, nausea, cough and headache. We suspected flu and treated her with taimflu.  She did feel better, but she returned to her morning exercise program this past Monday (today is Thursday) and feels that her energy and exercise tolerance are not as good as normal.  She feels like her "energy level goes up and down" during the day.  She feels "a heaviness in my head, and also a dizziness" that comes and goes.   She has a mild, dry cough and her throat feels raspy She is using sudafed- thinks that this could be the cause of her dizziness.   No fever  States that the "feels weird in my chest" when she breaths.  Her mother "died of a massive heart attack in her sleep."  She did have a lot of other health problems.  Died in her 82s.  Pt admits that this makes her feel anxious about her own heart.   Pt has noted a feeling of "light pressure" in her chest off an on for 6 months. She is not sure if this might be due to muscular soreness from lifting weights.  It may occur once every 2-3 months, will last for a few seconds.  It is not exertional.   She does not ever notice it when she is exercising-she exercises by running, spin class, body pump class.    BP Readings from Last 3 Encounters:  07/04/15 105/80  06/19/15 122/72  05/30/15 116/72    Patient Active Problem List   Diagnosis Date Noted  . Acute upper respiratory infection 05/30/2015  . Hematuria 01/20/2015  . Need for prophylactic vaccination and inoculation against influenza 01/20/2015  .  Hyperlipidemia, mild 01/20/2015  . Head congestion 01/20/2015  . Fatigue 10/24/2014  . Chest pressure 10/24/2014  . Sinusitis, acute maxillary 10/11/2014  . Hyperlipidemia, mixed 06/03/2014  . Preventative health care 06/03/2014  . TMJ arthralgia 08/20/2013  . Diverticulosis 07/01/2011  . Perimenopausal 03/25/2011  . Fibroids 10/24/2010  . FLATULENCE-GAS-BLOATING 05/26/2010  . Anemia 03/25/2010  . IRRITABLE BOWEL SYNDROME 03/25/2010  . MITRAL VALVE PROLAPSE 07/09/2007  . Asthma, exogenous 07/09/2007  . ESOPHAGEAL REFLUX 07/09/2007  . DERMATITIS 07/09/2007  . Allergic rhinitis due to pollen 12/14/2006  . LOW BACK PAIN 12/14/2006    Past Medical History  Diagnosis Date  . Mitral valve disorders   . Contact dermatitis and eczema   . Esophageal reflux   . Lumbago   . IBS (irritable bowel syndrome)   . Anxiety   . Anemia   . IC (interstitial cystitis)   . Internal hemorrhoids   . Diverticulitis     CT Scan  . Asthma   . Hyperlipidemia, mixed   . Allergic rhinitis     Past Surgical History  Procedure Laterality Date  . Cesarean section    . Sinus surgery with instatrak    . Mouth surgery    . Dilitation & currettage/hystroscopy with novasure ablation N/A 10/14/2012    Procedure: DILATATION &  CURETTAGE/HYSTEROSCOPY WITH NOVASURE ABLATION;  Surgeon: Lovenia Kim, MD;  Location: [facility];  Service: Gynecology;  Laterality: N/A;  . Bartholin cyst marsupialization N/A 12/18/2013    Procedure: BARTHOLIN CYST MARSUPIALIZATION WITH BIOPSY;  Surgeon: Lovenia Kim, MD;  Location: [facility];  Service: Gynecology;  Laterality: N/A;    Social History  Substance Use Topics  . Smoking status: Never Smoker   . Smokeless tobacco: Never Used  . Alcohol Use: No    Family History  Problem Relation Age of Onset  . Colon polyps Sister   . Mental illness Sister     anxiety from 9/11 survivors  . Varicose Veins Sister   . Diabetes Mother   . Hypertension Mother   . Heart attack  Mother   . Alcohol abuse Mother     smoker  . Heart disease Mother   . Dementia Mother   . Hypertension Father   . Hyperlipidemia Father   . Heart attack Father   . Cancer Father     lung cancer, smoker  . Alcohol abuse      Family history  . Colon cancer Neg Hx   . Cancer Brother     acute leukemia  . Dementia Maternal Grandmother   . Heart disease Maternal Grandfather     mi  . Cancer Paternal Grandmother     GYN cancer  . Other Sister     hypoglycemia  . Varicose Veins Sister   . Asthma Sister     rheumatoid  . Fibromyalgia Sister   . Mental retardation Sister     depression    Allergies  Allergen Reactions  . Aspirin Shortness Of Breath    "irritated stomach" Ibuprofen and Naproxen do not agree with her either  . Latex Shortness Of Breath and Itching  . Other Shortness Of Breath    peanuts  . Sulfamethoxazole-Trimethoprim Shortness Of Breath  . Sulfonamide Derivatives Shortness Of Breath and Itching  . Sulphadimidine Sodium [Sulfamethazine Sodium] Shortness Of Breath    Added to food for preservative  . Amoxicillin     Doesn't remember rxn  . Carisoprodol   . Ciprofloxacin     REACTION: SOB, "Tightness in head"  . Clarithromycin     "Doesn't agree with me"  . Codeine     Does not take percocet or hydrocodone; tylenol only  . Epinephrine     Heart racing  . Metronidazole Other (See Comments)    "cannot tolerate"  . Penicillins     Doesn't remember reaction    Medication list has been reviewed and updated.  Current Outpatient Prescriptions on File Prior to Visit  Medication Sig Dispense Refill  . azelastine (ASTELIN) 0.1 % nasal spray Place 2 sprays into both nostrils 2 (two) times daily. Use in each nostril as directed 30 mL 1  . fluticasone (FLONASE) 50 MCG/ACT nasal spray Place 2 sprays into both nostrils daily. 16 g 2  . HYDROcodone-homatropine (HYDROMET) 5-1.5 MG/5ML syrup Take 5 mLs by mouth every 6 (six) hours as needed for cough. (Patient not  taking: Reported on 07/04/2015) 120 mL 0  . oseltamivir (TAMIFLU) 75 MG capsule Take 1 capsule (75 mg total) by mouth 2 (two) times daily. (Patient not taking: Reported on 07/04/2015) 10 capsule 0   No current facility-administered medications on file prior to visit.    Review of Systems:  As per HPI- otherwise negative.   Physical Examination: Filed Vitals:   07/04/15 1731  BP: 100/90  Pulse: 91  Temp: 99 F (37.2 C)   Filed Vitals:   07/04/15 1731  Height: 4\' 11"  (1.499 m)  Weight: 100 lb (45.36 kg)   Body mass index is 20.19 kg/(m^2). Ideal Body Weight: Weight in (lb) to have BMI = 25: 123.5  GEN: WDWN, NAD, Non-toxic, A & O x 3, very slight build, looks well HEENT: Atraumatic, Normocephalic. Neck supple. No masses, No LAD.  Bilateral TM wnl, oropharynx normal.  PEERL,EOMI.   Ears and Nose: No external deformity. CV: RRR, No M/G/R. No JVD. No thrill. No extra heart sounds. PULM: CTA B, no wheezes, crackles, rhonchi. No retractions. No resp. distress. No accessory muscle use. ABD: S, NT, ND EXTR: No c/c/e NEURO Normal gait.  PSYCH: Normally interactive. Conversant. Not depressed or anxious appearing.  Calm demeanor.   EKG: NSR with rate of 67 BPM   Assessment and Plan: Chest tightness - Plan: EKG 12-Lead, Ambulatory referral to Cardiology  Family history of cardiovascular disease - Plan: Ambulatory referral to Cardiology  She was seen in late February with likely flu.  Here today because she does not feel that she is 100 % well.  she has noted a feeling of chest tightness.  Exam and VS are reassuring. She plans to have labs drawn tomorrow in preparation for a CPE.  Declines a CXR today but did accept an EKG.  This is normal.  She is able to do vigorous exercise without any CP so doubt CAD.  She would like to see cardiology however due to concern about family history which is ok.  I will arrange this for her.  Tessalon perles at home for lingering cough.   Signed Lamar Blinks, MD

## 2015-07-05 ENCOUNTER — Telehealth: Payer: Self-pay

## 2015-07-05 ENCOUNTER — Other Ambulatory Visit (INDEPENDENT_AMBULATORY_CARE_PROVIDER_SITE_OTHER): Payer: 59

## 2015-07-05 DIAGNOSIS — R5383 Other fatigue: Secondary | ICD-10-CM

## 2015-07-05 DIAGNOSIS — N390 Urinary tract infection, site not specified: Secondary | ICD-10-CM

## 2015-07-05 DIAGNOSIS — E785 Hyperlipidemia, unspecified: Secondary | ICD-10-CM | POA: Diagnosis not present

## 2015-07-05 DIAGNOSIS — R0981 Nasal congestion: Secondary | ICD-10-CM | POA: Diagnosis not present

## 2015-07-05 DIAGNOSIS — Z23 Encounter for immunization: Secondary | ICD-10-CM

## 2015-07-05 DIAGNOSIS — R1032 Left lower quadrant pain: Secondary | ICD-10-CM

## 2015-07-05 LAB — LIPID PANEL
CHOL/HDL RATIO: 4
Cholesterol: 189 mg/dL (ref 0–200)
HDL: 51.7 mg/dL (ref >39.00)
LDL CALC: 111 mg/dL — AB (ref 0–99)
NONHDL: 136.89
Triglycerides: 130 mg/dL (ref 0.0–149.0)
VLDL: 26 mg/dL (ref 0.0–40.0)

## 2015-07-05 LAB — COMPREHENSIVE METABOLIC PANEL
ALK PHOS: 63 U/L (ref 39–117)
ALT: 13 U/L (ref 0–35)
AST: 16 U/L (ref 0–37)
Albumin: 4.2 g/dL (ref 3.5–5.2)
BILIRUBIN TOTAL: 1.3 mg/dL — AB (ref 0.2–1.2)
BUN: 14 mg/dL (ref 6–23)
CO2: 28 mEq/L (ref 19–32)
Calcium: 9.2 mg/dL (ref 8.4–10.5)
Chloride: 102 mEq/L (ref 96–112)
Creatinine, Ser: 0.58 mg/dL (ref 0.40–1.20)
GFR: 116.86 mL/min (ref >60.00)
GLUCOSE: 91 mg/dL (ref 70–99)
POTASSIUM: 3.6 meq/L (ref 3.5–5.1)
SODIUM: 137 meq/L (ref 135–145)
TOTAL PROTEIN: 7.2 g/dL (ref 6.0–8.3)

## 2015-07-05 LAB — CBC
HCT: 39.7 % (ref 36.0–46.0)
Hemoglobin: 13.7 g/dL (ref 12.0–15.0)
MCHC: 34.6 g/dL (ref 30.0–36.0)
MCV: 96.9 fl (ref 78.0–100.0)
Platelets: 373 10*3/uL (ref 150.0–400.0)
RBC: 4.09 Mil/uL (ref 3.87–5.11)
RDW: 12.3 % (ref 11.5–15.5)
WBC: 4.3 10*3/uL (ref 4.0–10.5)

## 2015-07-05 LAB — MAGNESIUM: Magnesium: 2.1 mg/dL (ref 1.5–2.5)

## 2015-07-05 LAB — TSH: TSH: 1.89 u[IU]/mL (ref 0.35–4.50)

## 2015-07-05 NOTE — Telephone Encounter (Signed)
Pre Visit Call attempted. Left message on patient answering machine to update chart for appointment on Monday.

## 2015-07-08 ENCOUNTER — Encounter: Payer: Self-pay | Admitting: Family Medicine

## 2015-07-08 ENCOUNTER — Ambulatory Visit (INDEPENDENT_AMBULATORY_CARE_PROVIDER_SITE_OTHER): Payer: 59 | Admitting: Family Medicine

## 2015-07-08 VITALS — BP 100/64 | HR 82 | Temp 97.7°F | Ht 59.0 in | Wt 100.0 lb

## 2015-07-08 DIAGNOSIS — Z0000 Encounter for general adult medical examination without abnormal findings: Secondary | ICD-10-CM

## 2015-07-08 DIAGNOSIS — K219 Gastro-esophageal reflux disease without esophagitis: Secondary | ICD-10-CM

## 2015-07-08 DIAGNOSIS — M1990 Unspecified osteoarthritis, unspecified site: Secondary | ICD-10-CM

## 2015-07-08 DIAGNOSIS — D649 Anemia, unspecified: Secondary | ICD-10-CM

## 2015-07-08 DIAGNOSIS — I059 Rheumatic mitral valve disease, unspecified: Secondary | ICD-10-CM

## 2015-07-08 DIAGNOSIS — R319 Hematuria, unspecified: Secondary | ICD-10-CM

## 2015-07-08 DIAGNOSIS — J301 Allergic rhinitis due to pollen: Secondary | ICD-10-CM | POA: Diagnosis not present

## 2015-07-08 DIAGNOSIS — R350 Frequency of micturition: Secondary | ICD-10-CM | POA: Diagnosis not present

## 2015-07-08 HISTORY — DX: Unspecified osteoarthritis, unspecified site: M19.90

## 2015-07-08 MED ORDER — AZELASTINE HCL 0.1 % NA SOLN: 2.0000 | Freq: Two times a day (BID) | NASAL | Status: DC

## 2015-07-08 MED ORDER — FLUTICASONE PROPIONATE 50 MCG/ACT NA SUSP: 2.0000 | Freq: Every day | NASAL | Status: DC

## 2015-07-08 NOTE — Assessment & Plan Note (Signed)
Right hand and right knee, encouraged regular exercise and topical treatments such as Salon Pas or Aspercreme

## 2015-07-08 NOTE — Patient Instructions (Addendum)
biofreeze to affected areas   [email or web address], probiotics   Preventive Care for Adults, Female A healthy lifestyle and preventive care can promote health and wellness. Preventive health guidelines for women include the following key practices.  A routine yearly physical is a good way to check with your health care provider about your health and preventive screening. It is a chance to share any concerns and updates on your health and to receive a thorough exam.  Visit your dentist for a routine exam and preventive care every 6 months. Brush your teeth twice a day and floss once a day. Good oral hygiene prevents tooth decay and gum disease.  The frequency of eye exams is based on your age, health, family medical history, use of contact lenses, and other factors. Follow your health care provider's recommendations for frequency of eye exams.  Eat a healthy diet. Foods like vegetables, fruits, whole grains, low-fat dairy products, and lean protein foods contain the nutrients you need without too many calories. Decrease your intake of foods high in solid fats, added sugars, and salt. Eat the right amount of calories for you.Get information about a proper diet from your health care provider, if necessary.  Regular physical exercise is one of the most important things you can do for your health. Most adults should get at least 150 minutes of moderate-intensity exercise (any activity that increases your heart rate and causes you to sweat) each week. In addition, most adults need muscle-strengthening exercises on 2 or more days a week.  Maintain a healthy weight. The body mass index (BMI) is a screening tool to identify possible weight problems. It provides an estimate of body fat based on height and weight. Your health care provider can find your BMI and can help you achieve or maintain a healthy weight.For adults 20 years and older:  A BMI below 18.5 is considered underweight.  A BMI  of 18.5 to 24.9 is normal.  A BMI of 25 to 29.9 is considered overweight.  A BMI of 30 and above is considered obese.  Maintain normal blood lipids and cholesterol levels by exercising and minimizing your intake of saturated fat. Eat a balanced diet with plenty of fruit and vegetables. Blood tests for lipids and cholesterol should begin at age 18 and be repeated every 5 years. If your lipid or cholesterol levels are high, you are over 50, or you are at high risk for heart disease, you may need your cholesterol levels checked more frequently.Ongoing high lipid and cholesterol levels should be treated with medicines if diet and exercise are not working.  If you smoke, find out from your health care provider how to quit. If you do not use tobacco, do not start.  Lung cancer screening is recommended for adults aged 39-80 years who are at high risk for developing lung cancer because of a history of smoking. A yearly low-dose CT scan of the lungs is recommended for people who have at least a 30-pack-year history of smoking and are a current smoker or have quit within the past 15 years. A pack year of smoking is smoking an average of 1 pack of cigarettes a day for 1 year (for example: 1 pack a day for 30 years or 2 packs a day for 15 years). Yearly screening should continue until the smoker has stopped smoking for at least 15 years. Yearly screening should be stopped for people who develop a health problem that would prevent them from having lung cancer  treatment.  If you are pregnant, do not drink alcohol. If you are breastfeeding, be very cautious about drinking alcohol. If you are not pregnant and choose to drink alcohol, do not have more than 1 drink per day. One drink is considered to be 12 ounces (355 mL) of beer, 5 ounces (148 mL) of wine, or 1.5 ounces (44 mL) of liquor.  Avoid use of street drugs. Do not share needles with anyone. Ask for help if you need support or instructions about stopping the  use of drugs.  High blood pressure causes heart disease and increases the risk of stroke. Your blood pressure should be checked at least every 1 to 2 years. Ongoing high blood pressure should be treated with medicines if weight loss and exercise do not work.  If you are 43-65 years old, ask your health care provider if you should take aspirin to prevent strokes.  Diabetes screening is done by taking a blood sample to check your blood glucose level after you have not eaten for a certain period of time (fasting). If you are not overweight and you do not have risk factors for diabetes, you should be screened once every 3 years starting at age 32. If you are overweight or obese and you are 12-85 years of age, you should be screened for diabetes every year as part of your cardiovascular risk assessment.  Breast cancer screening is essential preventive care for women. You should practice "breast self-awareness." This means understanding the normal appearance and feel of your breasts and may include breast self-examination. Any changes detected, no matter how small, should be reported to a health care provider. Women in their 80s and 30s should have a clinical breast exam (CBE) by a health care provider as part of a regular health exam every 1 to 3 years. After age 91, women should have a CBE every year. Starting at age 31, women should consider having a mammogram (breast X-ray test) every year. Women who have a family history of breast cancer should talk to their health care provider about genetic screening. Women at a high risk of breast cancer should talk to their health care providers about having an MRI and a mammogram every year.  Breast cancer gene (BRCA)-related cancer risk assessment is recommended for women who have family members with BRCA-related cancers. BRCA-related cancers include breast, ovarian, tubal, and peritoneal cancers. Having family members with these cancers may be associated with an  increased risk for harmful changes (mutations) in the breast cancer genes BRCA1 and BRCA2. Results of the assessment will determine the need for genetic counseling and BRCA1 and BRCA2 testing.  Your health care provider may recommend that you be screened regularly for cancer of the pelvic organs (ovaries, uterus, and vagina). This screening involves a pelvic examination, including checking for microscopic changes to the surface of your cervix (Pap test). You may be encouraged to have this screening done every 3 years, beginning at age 20.  For women ages 29-65, health care providers may recommend pelvic exams and Pap testing every 3 years, or they may recommend the Pap and pelvic exam, combined with testing for human papilloma virus (HPV), every 5 years. Some types of HPV increase your risk of cervical cancer. Testing for HPV may also be done on women of any age with unclear Pap test results.  Other health care providers may not recommend any screening for nonpregnant women who are considered low risk for pelvic cancer and who do not have symptoms.  Ask your health care provider if a screening pelvic exam is right for you.  If you have had past treatment for cervical cancer or a condition that could lead to cancer, you need Pap tests and screening for cancer for at least 20 years after your treatment. If Pap tests have been discontinued, your risk factors (such as having a new sexual partner) need to be reassessed to determine if screening should resume. Some women have medical problems that increase the chance of getting cervical cancer. In these cases, your health care provider may recommend more frequent screening and Pap tests.  Colorectal cancer can be detected and often prevented. Most routine colorectal cancer screening begins at the age of 50 years and continues through age 80 years. However, your health care provider may recommend screening at an earlier age if you have risk factors for colon  cancer. On a yearly basis, your health care provider may provide home test kits to check for hidden blood in the stool. Use of a small camera at the end of a tube, to directly examine the colon (sigmoidoscopy or colonoscopy), can detect the earliest forms of colorectal cancer. Talk to your health care provider about this at age 62, when routine screening begins. Direct exam of the colon should be repeated every 5-10 years through age 90 years, unless early forms of precancerous polyps or small growths are found.  People who are at an increased risk for hepatitis B should be screened for this virus. You are considered at high risk for hepatitis B if:  You were born in a country where hepatitis B occurs often. Talk with your health care provider about which countries are considered high risk.  Your parents were born in a high-risk country and you have not received a shot to protect against hepatitis B (hepatitis B vaccine).  You have HIV or AIDS.  You use needles to inject street drugs.  You live with, or have sex with, someone who has hepatitis B.  You get hemodialysis treatment.  You take certain medicines for conditions like cancer, organ transplantation, and autoimmune conditions.  Hepatitis C blood testing is recommended for all people born from [birth year] through 1965 and any individual with known risks for hepatitis C.  Practice safe sex. Use condoms and avoid high-risk sexual practices to reduce the spread of sexually transmitted infections (STIs). STIs include gonorrhea, chlamydia, syphilis, trichomonas, herpes, HPV, and human immunodeficiency virus (HIV). Herpes, HIV, and HPV are viral illnesses that have no cure. They can result in disability, cancer, and death.  You should be screened for sexually transmitted illnesses (STIs) including gonorrhea and chlamydia if:  You are sexually active and are younger than 24 years.  You are older than 24 years and your health care provider tells you  that you are at risk for this type of infection.  Your sexual activity has changed since you were last screened and you are at an increased risk for chlamydia or gonorrhea. Ask your health care provider if you are at risk.  If you are at risk of being infected with HIV, it is recommended that you take a prescription medicine daily to prevent HIV infection. This is called preexposure prophylaxis (PrEP). You are considered at risk if:  You are sexually active and do not regularly use condoms or know the HIV status of your partner(s).  You take drugs by injection.  You are sexually active with a partner who has HIV.  Talk with your health care provider  about whether you are at high risk of being infected with HIV. If you choose to begin PrEP, you should first be tested for HIV. You should then be tested every 3 months for as long as you are taking PrEP.  Osteoporosis is a disease in which the bones lose minerals and strength with aging. This can result in serious bone fractures or breaks. The risk of osteoporosis can be identified using a bone density scan. Women ages 43 years and over and women at risk for fractures or osteoporosis should discuss screening with their health care providers. Ask your health care provider whether you should take a calcium supplement or vitamin D to reduce the rate of osteoporosis.  Menopause can be associated with physical symptoms and risks. Hormone replacement therapy is available to decrease symptoms and risks. You should talk to your health care provider about whether hormone replacement therapy is right for you.  Use sunscreen. Apply sunscreen liberally and repeatedly throughout the day. You should seek shade when your shadow is shorter than you. Protect yourself by wearing long sleeves, pants, a wide-brimmed hat, and sunglasses year round, whenever you are outdoors.  Once a month, do a whole body skin exam, using a mirror to look at the skin on your back. Tell  your health care provider of new moles, moles that have irregular borders, moles that are larger than a pencil eraser, or moles that have changed in shape or color.  Stay current with required vaccines (immunizations).  Influenza vaccine. All adults should be immunized every year.  Tetanus, diphtheria, and acellular pertussis (Td, Tdap) vaccine. Pregnant women should receive 1 dose of Tdap vaccine during each pregnancy. The dose should be obtained regardless of the length of time since the last dose. Immunization is preferred during the 27th-36th week of gestation. An adult who has not previously received Tdap or who does not know her vaccine status should receive 1 dose of Tdap. This initial dose should be followed by tetanus and diphtheria toxoids (Td) booster doses every 10 years. Adults with an unknown or incomplete history of completing a 3-dose immunization series with Td-containing vaccines should begin or complete a primary immunization series including a Tdap dose. Adults should receive a Td booster every 10 years.  Varicella vaccine. An adult without evidence of immunity to varicella should receive 2 doses or a second dose if she has previously received 1 dose. Pregnant females who do not have evidence of immunity should receive the first dose after pregnancy. This first dose should be obtained before leaving the health care facility. The second dose should be obtained 4-8 weeks after the first dose.  Human papillomavirus (HPV) vaccine. Females aged 13-26 years who have not received the vaccine previously should obtain the 3-dose series. The vaccine is not recommended for use in pregnant females. However, pregnancy testing is not needed before receiving a dose. If a female is found to be pregnant after receiving a dose, no treatment is needed. In that case, the remaining doses should be delayed until after the pregnancy. Immunization is recommended for any person with an immunocompromised  condition through the age of 29 years if she did not get any or all doses earlier. During the 3-dose series, the second dose should be obtained 4-8 weeks after the first dose. The third dose should be obtained 24 weeks after the first dose and 16 weeks after the second dose.  Zoster vaccine. One dose is recommended for adults aged 41 years or older unless  certain conditions are present.  Measles, mumps, and rubella (MMR) vaccine. Adults born before [birth year] generally are considered immune to measles and mumps. Adults born in [birth year] or later should have 1 or more doses of MMR vaccine unless there is a contraindication to the vaccine or there is laboratory evidence of immunity to each of the three diseases. A routine second dose of MMR vaccine should be obtained at least 28 days after the first dose for students attending postsecondary schools, health care workers, or international travelers. People who received inactivated measles vaccine or an unknown type of measles vaccine during 1963-1967 should receive 2 doses of MMR vaccine. People who received inactivated mumps vaccine or an unknown type of mumps vaccine before 1979 and are at high risk for mumps infection should consider immunization with 2 doses of MMR vaccine. For females of childbearing age, rubella immunity should be determined. If there is no evidence of immunity, females who are not pregnant should be vaccinated. If there is no evidence of immunity, females who are pregnant should delay immunization until after pregnancy. Unvaccinated health care workers born before [birth year] who lack laboratory evidence of measles, mumps, or rubella immunity or laboratory confirmation of disease should consider measles and mumps immunization with 2 doses of MMR vaccine or rubella immunization with 1 dose of MMR vaccine.  Pneumococcal 13-valent conjugate (PCV13) vaccine. When indicated, a person who is uncertain of his immunization history and has no record of immunization  should receive the PCV13 vaccine. All adults 74 years of age and older should receive this vaccine. An adult aged 71 years or older who has certain medical conditions and has not been previously immunized should receive 1 dose of PCV13 vaccine. This PCV13 should be followed with a dose of pneumococcal polysaccharide (PPSV23) vaccine. Adults who are at high risk for pneumococcal disease should obtain the PPSV23 vaccine at least 8 weeks after the dose of PCV13 vaccine. Adults older than 51 years of age who have normal immune system function should obtain the PPSV23 vaccine dose at least 1 year after the dose of PCV13 vaccine.  Pneumococcal polysaccharide (PPSV23) vaccine. When PCV13 is also indicated, PCV13 should be obtained first. All adults aged 10 years and older should be immunized. An adult younger than age 75 years who has certain medical conditions should be immunized. Any person who resides in a nursing home or long-term care facility should be immunized. An adult smoker should be immunized. People with an immunocompromised condition and certain other conditions should receive both PCV13 and PPSV23 vaccines. People with human immunodeficiency virus (HIV) infection should be immunized as soon as possible after diagnosis. Immunization during chemotherapy or radiation therapy should be avoided. Routine use of PPSV23 vaccine is not recommended for American Indians, [place] Natives, or people younger than 65 years unless there are medical conditions that require PPSV23 vaccine. When indicated, people who have unknown immunization and have no record of immunization should receive PPSV23 vaccine. One-time revaccination 5 years after the first dose of PPSV23 is recommended for people aged 19-64 years who have chronic kidney failure, nephrotic syndrome, asplenia, or immunocompromised conditions. People who received 1-2 doses of PPSV23 before age 4 years should receive another dose of PPSV23 vaccine at age 93 years  or later if at least 5 years have passed since the previous dose. Doses of PPSV23 are not needed for people immunized with PPSV23 at or after age 69 years.  Meningococcal vaccine. Adults with asplenia or persistent complement component deficiencies should receive  2 doses of quadrivalent meningococcal conjugate (MenACWY-D) vaccine. The doses should be obtained at least 2 months apart. Microbiologists working with certain meningococcal bacteria, [employer] recruits, people at risk during an outbreak, and people who travel to or live in countries with a high rate of meningitis should be immunized. A first-year college student up through age 37 years who is living in a residence hall should receive a dose if she did not receive a dose on or after her 16th birthday. Adults who have certain high-risk conditions should receive one or more doses of vaccine.  Hepatitis A vaccine. Adults who wish to be protected from this disease, have certain high-risk conditions, work with hepatitis A-infected animals, work in hepatitis A research labs, or travel to or work in countries with a high rate of hepatitis A should be immunized. Adults who were previously unvaccinated and who anticipate close contact with an international adoptee during the first 60 days after arrival in the [place] from a country with a high rate of hepatitis A should be immunized.  Hepatitis B vaccine. Adults who wish to be protected from this disease, have certain high-risk conditions, may be exposed to blood or other infectious body fluids, are household contacts or sex partners of hepatitis B positive people, are clients or workers in certain care facilities, or travel to or work in countries with a high rate of hepatitis B should be immunized.  Haemophilus influenzae type b (Hib) vaccine. A previously unvaccinated person with asplenia or sickle cell disease or having a scheduled splenectomy should receive 1 dose of Hib vaccine. Regardless of  previous immunization, a recipient of a hematopoietic stem cell transplant should receive a 3-dose series 6-12 months after her successful transplant. Hib vaccine is not recommended for adults with HIV infection. Preventive Services / Frequency Ages 43 to 77 years  Blood pressure check.** / Every 3-5 years.  Lipid and cholesterol check.** / Every 5 years beginning at age 71.  Clinical breast exam.** / Every 3 years for women in their 84s and 20s.  BRCA-related cancer risk assessment.** / For women who have family members with a BRCA-related cancer (breast, ovarian, tubal, or peritoneal cancers).  Pap test.** / Every 2 years from ages 88 through 25. Every 3 years starting at age 29 through age 53 or 56 with a history of 3 consecutive normal Pap tests.  HPV screening.** / Every 3 years from ages 31 through ages 14 to 23 with a history of 3 consecutive normal Pap tests.  Hepatitis C blood test.** / For any individual with known risks for hepatitis C.  Skin self-exam. / Monthly.  Influenza vaccine. / Every year.  Tetanus, diphtheria, and acellular pertussis (Tdap, Td) vaccine.** / Consult your health care provider. Pregnant women should receive 1 dose of Tdap vaccine during each pregnancy. 1 dose of Td every 10 years.  Varicella vaccine.** / Consult your health care provider. Pregnant females who do not have evidence of immunity should receive the first dose after pregnancy.  HPV vaccine. / 3 doses over 6 months, if 49 and younger. The vaccine is not recommended for use in pregnant females. However, pregnancy testing is not needed before receiving a dose.  Measles, mumps, rubella (MMR) vaccine.** / You need at least 1 dose of MMR if you were born in 1957 or later. You may also need a 2nd dose. For females of childbearing age, rubella immunity should be determined. If there is no evidence of immunity, females who are not pregnant  should be vaccinated. If there is no evidence of immunity,  females who are pregnant should delay immunization until after pregnancy.  Pneumococcal 13-valent conjugate (PCV13) vaccine.** / Consult your health care provider.  Pneumococcal polysaccharide (PPSV23) vaccine.** / 1 to 2 doses if you smoke cigarettes or if you have certain conditions.  Meningococcal vaccine.** / 1 dose if you are age 8 to 75 years and a Market researcher living in a residence hall, or have one of several medical conditions, you need to get vaccinated against meningococcal disease. You may also need additional booster doses.  Hepatitis A vaccine.** / Consult your health care provider.  Hepatitis B vaccine.** / Consult your health care provider.  Haemophilus influenzae type b (Hib) vaccine.** / Consult your health care provider. Ages 63 to 30 years  Blood pressure check.** / Every year.  Lipid and cholesterol check.** / Every 5 years beginning at age 19 years.  Lung cancer screening. / Every year if you are aged 14-80 years and have a 30-pack-year history of smoking and currently smoke or have quit within the past 15 years. Yearly screening is stopped once you have quit smoking for at least 15 years or develop a health problem that would prevent you from having lung cancer treatment.  Clinical breast exam.** / Every year after age 62 years.  BRCA-related cancer risk assessment.** / For women who have family members with a BRCA-related cancer (breast, ovarian, tubal, or peritoneal cancers).  Mammogram.** / Every year beginning at age 17 years and continuing for as long as you are in good health. Consult with your health care provider.  Pap test.** / Every 3 years starting at age 5 years through age 71 or 74 years with a history of 3 consecutive normal Pap tests.  HPV screening.** / Every 3 years from ages 10 years through ages 68 to 68 years with a history of 3 consecutive normal Pap tests.  Fecal occult blood test (FOBT) of stool. / Every year beginning at  age 72 years and continuing until age 37 years. You may not need to do this test if you get a colonoscopy every 10 years.  Flexible sigmoidoscopy or colonoscopy.** / Every 5 years for a flexible sigmoidoscopy or every 10 years for a colonoscopy beginning at age 20 years and continuing until age 33 years.  Hepatitis C blood test.** / For all people born from [birth year] through 1966 and any individual with known risks for hepatitis C.  Skin self-exam. / Monthly.  Influenza vaccine. / Every year.  Tetanus, diphtheria, and acellular pertussis (Tdap/Td) vaccine.** / Consult your health care provider. Pregnant women should receive 1 dose of Tdap vaccine during each pregnancy. 1 dose of Td every 10 years.  Varicella vaccine.** / Consult your health care provider. Pregnant females who do not have evidence of immunity should receive the first dose after pregnancy.  Zoster vaccine.** / 1 dose for adults aged 23 years or older.  Measles, mumps, rubella (MMR) vaccine.** / You need at least 1 dose of MMR if you were born in 1957 or later. You may also need a second dose. For females of childbearing age, rubella immunity should be determined. If there is no evidence of immunity, females who are not pregnant should be vaccinated. If there is no evidence of immunity, females who are pregnant should delay immunization until after pregnancy.  Pneumococcal 13-valent conjugate (PCV13) vaccine.** / Consult your health care provider.  Pneumococcal polysaccharide (PPSV23) vaccine.** / 1 to 2 doses if  you smoke cigarettes or if you have certain conditions.  Meningococcal vaccine.** / Consult your health care provider.  Hepatitis A vaccine.** / Consult your health care provider.  Hepatitis B vaccine.** / Consult your health care provider.  Haemophilus influenzae type b (Hib) vaccine.** / Consult your health care provider. Ages 37 years and over  Blood pressure check.** / Every year.  Lipid and cholesterol check.**  / Every 5 years beginning at age 65 years.  Lung cancer screening. / Every year if you are aged 75-80 years and have a 30-pack-year history of smoking and currently smoke or have quit within the past 15 years. Yearly screening is stopped once you have quit smoking for at least 15 years or develop a health problem that would prevent you from having lung cancer treatment.  Clinical breast exam.** / Every year after age 96 years.  BRCA-related cancer risk assessment.** / For women who have family members with a BRCA-related cancer (breast, ovarian, tubal, or peritoneal cancers).  Mammogram.** / Every year beginning at age 68 years and continuing for as long as you are in good health. Consult with your health care provider.  Pap test.** / Every 3 years starting at age 53 years through age 33 or 71 years with 3 consecutive normal Pap tests. Testing can be stopped between 65 and 70 years with 3 consecutive normal Pap tests and no abnormal Pap or HPV tests in the past 10 years.  HPV screening.** / Every 3 years from ages 50 years through ages 20 or 65 years with a history of 3 consecutive normal Pap tests. Testing can be stopped between 65 and 70 years with 3 consecutive normal Pap tests and no abnormal Pap or HPV tests in the past 10 years.  Fecal occult blood test (FOBT) of stool. / Every year beginning at age 44 years and continuing until age 60 years. You may not need to do this test if you get a colonoscopy every 10 years.  Flexible sigmoidoscopy or colonoscopy.** / Every 5 years for a flexible sigmoidoscopy or every 10 years for a colonoscopy beginning at age 43 years and continuing until age 67 years.  Hepatitis C blood test.** / For all people born from [birth year] through 1966 and any individual with known risks for hepatitis C.  Osteoporosis screening.** / A one-time screening for women ages 66 years and over and women at risk for fractures or osteoporosis.  Skin self-exam. / Monthly.  Influenza  vaccine. / Every year.  Tetanus, diphtheria, and acellular pertussis (Tdap/Td) vaccine.** / 1 dose of Td every 10 years.  Varicella vaccine.** / Consult your health care provider.  Zoster vaccine.** / 1 dose for adults aged 62 years or older.  Pneumococcal 13-valent conjugate (PCV13) vaccine.** / Consult your health care provider.  Pneumococcal polysaccharide (PPSV23) vaccine.** / 1 dose for all adults aged 60 years and older.  Meningococcal vaccine.** / Consult your health care provider.  Hepatitis A vaccine.** / Consult your health care provider.  Hepatitis B vaccine.** / Consult your health care provider.  Haemophilus influenzae type b (Hib) vaccine.** / Consult your health care provider. ** Family history and personal history of risk and conditions may change your health care provider's recommendations.   This information is not intended to replace advice given to you by your health care provider. Make sure you discuss any questions you have with your health care provider.   Document Released: 06/09/2001 Document Revised: 05/04/2014 Document Reviewed: 09/08/2010 Elsevier Interactive Patient Education 2016 Elsevier  Inc.

## 2015-07-08 NOTE — Assessment & Plan Note (Signed)
Increase leafy greens, consider increased lean red meat and using cast iron cookware. Continue to monitor, report any concerns 

## 2015-07-08 NOTE — Progress Notes (Signed)
Subjective:    Patient ID: [name], female    DOB: 02/08/1966, 50 y.o.   MRN: [id number]  Chief Complaint  Patient presents with  . Annual Exam    HPI Patient is in today for Annual Physical Exam. Has some concerns. She is developing more discomfort and stiffness in right had and right knee. She denies any warmth, redness or swelling. No injury. Worse after prolonged sitting and in am. Also notes last colonoscopy was completed in 2013, repeat in 2023. No GI complaints. Is also noting some urinary frequency but no hematuria. Finally notes some Migraines but responds to Imitrex and occurs less frequently than they used to. Denies CP/palp/SOB/HA/congestion/fevers/GI or GU c/o. Taking meds as prescribed  Past Medical History  Diagnosis Date  . Mitral valve disorders   . Contact dermatitis and eczema   . Esophageal reflux   . Lumbago   . IBS (irritable bowel syndrome)   . Anxiety   . Anemia   . IC (interstitial cystitis)   . Internal hemorrhoids   . Diverticulitis     CT Scan  . Asthma   . Hyperlipidemia, mixed   . Allergic rhinitis   . Osteoarthritis 07/08/2015    Past Surgical History  Procedure Laterality Date  . Cesarean section    . Sinus surgery with instatrak    . Mouth surgery    . Dilitation & currettage/hystroscopy with novasure ablation N/A 10/14/2012    Procedure: DILATATION & CURETTAGE/HYSTEROSCOPY WITH NOVASURE ABLATION;  Surgeon: Lovenia Kim, MD;  Location: [facility];  Service: Gynecology;  Laterality: N/A;  . Bartholin cyst marsupialization N/A 12/18/2013    Procedure: BARTHOLIN CYST MARSUPIALIZATION WITH BIOPSY;  Surgeon: Lovenia Kim, MD;  Location: [facility];  Service: Gynecology;  Laterality: N/A;    Family History  Problem Relation Age of Onset  . Colon polyps Sister   . Mental illness Sister     anxiety from 9/11 survivors  . Varicose Veins Sister   . Diabetes Mother   . Hypertension Mother   . Heart attack Mother   . Alcohol abuse Mother      smoker  . Heart disease Mother   . Dementia Mother   . Hypertension Father   . Hyperlipidemia Father   . Heart attack Father   . Cancer Father     lung cancer, smoker  . Alcohol abuse      Family history  . Colon cancer Neg Hx   . Cancer Brother     acute leukemia  . Dementia Maternal Grandmother   . Heart disease Maternal Grandfather     mi  . Cancer Paternal Grandmother     GYN cancer  . Other Sister     hypoglycemia  . Varicose Veins Sister   . Asthma Sister     rheumatoid  . Fibromyalgia Sister   . Mental retardation Sister     depression  . GER disease Son     Social History   Social History  . Marital Status: Married    Spouse Name: N/A  . Number of Children: N/A  . Years of Education: N/A   Occupational History  . [employer]     Social History Main Topics  . Smoking status: Never Smoker   . Smokeless tobacco: Never Used  . Alcohol Use: No  . Drug Use: No  . Sexual Activity: Yes     Comment: lives with husband and son, work with [employer] as a Clinical research associate, aoivd gluten, dairy  Other Topics Concern  . Not on file   Social History Narrative    Outpatient Prescriptions Prior to Visit  Medication Sig Dispense Refill  . azelastine (ASTELIN) 0.1 % nasal spray Place 2 sprays into both nostrils 2 (two) times daily. Use in each nostril as directed 30 mL 1  . fluticasone (FLONASE) 50 MCG/ACT nasal spray Place 2 sprays into both nostrils daily. 16 g 2   No facility-administered medications prior to visit.    Allergies  Allergen Reactions  . Aspirin Shortness Of Breath    "irritated stomach" Ibuprofen and Naproxen do not agree with her either  . Latex Shortness Of Breath and Itching  . Other Shortness Of Breath    peanuts  . Sulfamethoxazole-Trimethoprim Shortness Of Breath  . Sulfonamide Derivatives Shortness Of Breath and Itching  . Sulphadimidine Sodium [Sulfamethazine Sodium] Shortness Of Breath    Added to food for preservative  . Amoxicillin      Doesn't remember rxn  . Carisoprodol   . Ciprofloxacin     REACTION: SOB, "Tightness in head"  . Clarithromycin     "Doesn't agree with me"  . Codeine     Does not take percocet or hydrocodone; tylenol only  . Epinephrine     Heart racing  . Metronidazole Other (See Comments)    "cannot tolerate"  . Nsaids   . Penicillins     Doesn't remember reaction    Review of Systems  Constitutional: Negative for fever and malaise/fatigue.  HENT: Positive for congestion.   Eyes: Negative for blurred vision.  Respiratory: Negative for shortness of breath.   Cardiovascular: Negative for chest pain, palpitations and leg swelling.  Gastrointestinal: Negative for nausea, abdominal pain and blood in stool.  Genitourinary: Positive for frequency. Negative for dysuria.  Musculoskeletal: Positive for joint pain. Negative for falls.  Skin: Negative for rash.  Neurological: Positive for headaches. Negative for dizziness and loss of consciousness.  Endo/Heme/Allergies: Negative for environmental allergies.  Psychiatric/Behavioral: Negative for depression. The patient is not nervous/anxious.        Objective:    Physical Exam  Constitutional: She is oriented to person, place, and time. She appears well-developed and well-nourished. No distress.  HENT:  Head: Normocephalic and atraumatic.  Eyes: Conjunctivae are normal.  Neck: Neck supple. No thyromegaly present.  Cardiovascular: Normal rate, regular rhythm and normal heart sounds.   No murmur heard. Pulmonary/Chest: Effort normal and breath sounds normal. No respiratory distress.  Abdominal: Soft. Bowel sounds are normal. She exhibits no distension and no mass. There is no tenderness.  Musculoskeletal: She exhibits no edema.  Lymphadenopathy:    She has no cervical adenopathy.  Neurological: She is alert and oriented to person, place, and time.  Skin: Skin is warm and dry.  Psychiatric: She has a normal mood and affect. Her behavior is  normal.    BP 100/64 mmHg  Pulse 82  Temp(Src) 97.7 F (36.5 C) (Oral)  Ht 4\' 11"  (1.499 m)  Wt 100 lb (45.36 kg)  BMI 20.19 kg/m2  SpO2 95%  LMP 06/13/2015 Wt Readings from Last 3 Encounters:  07/08/15 100 lb (45.36 kg)  07/04/15 100 lb (45.36 kg)  06/19/15 98 lb 6.4 oz (44.634 kg)     Lab Results  Component Value Date   WBC 4.3 07/05/2015   HGB 13.7 07/05/2015   HCT 39.7 07/05/2015   PLT 373.0 07/05/2015   GLUCOSE 91 07/05/2015   CHOL 189 07/05/2015   TRIG 130.0 07/05/2015   HDL  51.70 07/05/2015   LDLDIRECT 142.7 03/02/2012   LDLCALC 111* 07/05/2015   ALT 13 07/05/2015   AST 16 07/05/2015   NA 137 07/05/2015   K 3.6 07/05/2015   CL 102 07/05/2015   CREATININE 0.58 07/05/2015   BUN 14 07/05/2015   CO2 28 07/05/2015   TSH 1.89 07/05/2015   HGBA1C 4.8 12/12/2010    Lab Results  Component Value Date   TSH 1.89 07/05/2015   Lab Results  Component Value Date   WBC 4.3 07/05/2015   HGB 13.7 07/05/2015   HCT 39.7 07/05/2015   MCV 96.9 07/05/2015   PLT 373.0 07/05/2015   Lab Results  Component Value Date   NA 137 07/05/2015   K 3.6 07/05/2015   CO2 28 07/05/2015   GLUCOSE 91 07/05/2015   BUN 14 07/05/2015   CREATININE 0.58 07/05/2015   BILITOT 1.3* 07/05/2015   ALKPHOS 63 07/05/2015   AST 16 07/05/2015   ALT 13 07/05/2015   PROT 7.2 07/05/2015   ALBUMIN 4.2 07/05/2015   CALCIUM 9.2 07/05/2015   GFR 116.86 07/05/2015   Lab Results  Component Value Date   CHOL 189 07/05/2015   Lab Results  Component Value Date   HDL 51.70 07/05/2015   Lab Results  Component Value Date   LDLCALC 111* 07/05/2015   Lab Results  Component Value Date   TRIG 130.0 07/05/2015   Lab Results  Component Value Date   CHOLHDL 4 07/05/2015   Lab Results  Component Value Date   HGBA1C 4.8 12/12/2010       Assessment & Plan:   Problem List Items Addressed This Visit    Allergic rhinitis due to pollen    Continue nasal treatments and consider Zyrtec daily.  Has used Alavert with good results.      Relevant Orders   Lipid panel   TSH   CBC   Comprehensive metabolic panel   Anemia - Primary    Increase leafy greens, consider increased lean red meat and using cast iron cookware. Continue to monitor, report any concerns      ESOPHAGEAL REFLUX    Avoid offending foods, start probiotics. Do not eat large meals in late evening and consider raising head of bed.       Relevant Orders   Lipid panel   TSH   CBC   Comprehensive metabolic panel   Hematuria    With some frequency today, recheck UA today      Mitral valve disorder    No murmur      Relevant Orders   Lipid panel   TSH   CBC   Comprehensive metabolic panel   Osteoarthritis    Right hand and right knee, encouraged regular exercise and topical treatments such as Salon Pas or Aspercreme      Relevant Orders   Lipid panel   TSH   CBC   Comprehensive metabolic panel   Preventative health care    Patient encouraged to maintain heart healthy diet, regular exercise, adequate sleep. Consider daily probiotics. Take medications as prescribed. Labs reviewed. Sees Dr Si Raider GYN annually for paps and MGM. Sees Dr Henrene Pastor for colonoscopy 2013, repeat in 2013. .sba      Relevant Orders   Lipid panel   TSH   CBC   Comprehensive metabolic panel    Other Visit Diagnoses    Urinary frequency        Relevant Orders    Urinalysis    Lipid panel  TSH    CBC    Comprehensive metabolic panel       I am having Ms. Traynham maintain her fluticasone and azelastine.  Meds ordered this encounter  Medications  . fluticasone (FLONASE) 50 MCG/ACT nasal spray    Sig: Place 2 sprays into both nostrils daily.    Dispense:  16 g    Refill:  6  . azelastine (ASTELIN) 0.1 % nasal spray    Sig: Place 2 sprays into both nostrils 2 (two) times daily. Use in each nostril as directed    Dispense:  30 mL    Refill:  6     Penni Homans, MD

## 2015-07-08 NOTE — Assessment & Plan Note (Signed)
Improving notes some sinus pressure and dry mouth, encouraged ongoing rest and hydration. Report if worsens

## 2015-07-08 NOTE — Assessment & Plan Note (Signed)
No murmur

## 2015-07-08 NOTE — Assessment & Plan Note (Signed)
Patient encouraged to maintain heart healthy diet, regular exercise, adequate sleep. Consider daily probiotics. Take medications as prescribed. Labs reviewed. Sees Dr Si Raider GYN annually for paps and MGM. Sees Dr Henrene Pastor for colonoscopy 2013, repeat in 2013. .[name]

## 2015-07-08 NOTE — Assessment & Plan Note (Signed)
Continue nasal treatments and consider Zyrtec daily. Has used Alavert with good results.

## 2015-07-08 NOTE — Assessment & Plan Note (Signed)
With some frequency today, recheck UA today

## 2015-07-08 NOTE — Progress Notes (Signed)
Pre visit review using our clinic review tool, if applicable. No additional management support is needed unless otherwise documented below in the visit note. 

## 2015-07-08 NOTE — Assessment & Plan Note (Signed)
Avoid offending foods, start probiotics. Do not eat large meals in late evening and consider raising head of bed.  

## 2015-07-09 ENCOUNTER — Telehealth: Payer: Self-pay | Admitting: Family Medicine

## 2015-07-09 LAB — URINALYSIS, ROUTINE W REFLEX MICROSCOPIC
Bilirubin Urine: NEGATIVE
KETONES UR: NEGATIVE
LEUKOCYTES UA: NEGATIVE
Nitrite: NEGATIVE
PH: 6 (ref 5.0–8.0)
SPECIFIC GRAVITY, URINE: 1.01 (ref 1.000–1.030)
Total Protein, Urine: NEGATIVE
UROBILINOGEN UA: 0.2 (ref 0.0–1.0)
Urine Glucose: NEGATIVE
WBC, UA: NONE SEEN (ref 0–?)

## 2015-07-09 NOTE — Telephone Encounter (Signed)
Error

## 2015-07-10 ENCOUNTER — Telehealth: Payer: Self-pay | Admitting: Family Medicine

## 2015-07-10 NOTE — Telephone Encounter (Signed)
Pt says that she was informed by PCP that she has calcium in her urine and that she could possibly have kidney stones. Pt has a few questions in regards to discussion. Pt says that she would like to have an x-ray to confirm if kidney stone.   Please advise and assist pt further.    Thanks.  CB: [id number]

## 2015-07-11 NOTE — Telephone Encounter (Signed)
xrays can be nondiagnostic when people are asymptomatic, is she having any pain? I am happy to order if she would like to despite this fact.

## 2015-07-16 NOTE — Telephone Encounter (Signed)
Would not do a repeat urine now if symptoms are improved. I would check a pelvic xray if she is having symptoms in both legs to further evaluat eif she would like

## 2015-07-16 NOTE — Telephone Encounter (Signed)
Spoke to the patient and she is doing ok now and not having any pain. Should she do another urine test at this time?? Also she stated her legs are still feeling heavy and PCP mentioned doing here in our building xrays of her legs?

## 2015-07-16 NOTE — Telephone Encounter (Signed)
Called the patient left message to call back 

## 2015-07-17 NOTE — Telephone Encounter (Signed)
Left message for pt to call back with any questions.

## 2015-07-18 NOTE — Telephone Encounter (Signed)
Called left message to call back 

## 2015-07-18 NOTE — Telephone Encounter (Signed)
Symptoms in both legs can often be accounted for by the lower back, sacrum which an xray of the pelvis. Is she is having claudication type symptoms with pain/heaviness in both legs after walking a specific distance we could also run vascular studies to rule out intermittent claudication, Blocks in her vascular system in the legs. She is welcome to come in to discuss more if she would prefer

## 2015-07-18 NOTE — Telephone Encounter (Signed)
Patient did not understand why a pelvic xray, she thought her veins in her legs may need to be checked as Dr. Arnoldo Morale sent her for once.  Advise please

## 2015-07-19 ENCOUNTER — Other Ambulatory Visit: Payer: Self-pay | Admitting: Family Medicine

## 2015-07-19 DIAGNOSIS — R29898 Other symptoms and signs involving the musculoskeletal system: Secondary | ICD-10-CM

## 2015-07-19 DIAGNOSIS — M25562 Pain in left knee: Secondary | ICD-10-CM

## 2015-07-19 DIAGNOSIS — M5442 Lumbago with sciatica, left side: Principal | ICD-10-CM

## 2015-07-19 DIAGNOSIS — M25561 Pain in right knee: Secondary | ICD-10-CM

## 2015-07-19 DIAGNOSIS — M5441 Lumbago with sciatica, right side: Secondary | ICD-10-CM

## 2015-07-19 NOTE — Telephone Encounter (Signed)
The patient called back and does want you to order the U/S for her legs please.

## 2015-07-19 NOTE — Telephone Encounter (Signed)
Spoke to the patient and for now just order the xray of the pelvis. I did encourage her to scheduled an appointment with you asap as she continues to have questions regarding her legs and needs a face to face with her PCP to discuss/not going back and forth with telephone notes. She also stated last night she was having left lower back pain around her kidneys and is concerned again regarding a kidney stone.  Again I did encourage her to schedule an appointment

## 2015-07-23 ENCOUNTER — Other Ambulatory Visit: Payer: Self-pay | Admitting: Family Medicine

## 2015-07-23 DIAGNOSIS — R29898 Other symptoms and signs involving the musculoskeletal system: Secondary | ICD-10-CM

## 2015-07-23 NOTE — Telephone Encounter (Signed)
Ordered arterial dopplers for [facility] location

## 2015-07-23 NOTE — Telephone Encounter (Signed)
Xray needs clarification.  States if ruling out claudication it would need to go to [facility] as they do not do vascular studies.

## 2015-07-26 ENCOUNTER — Ambulatory Visit: Payer: 59 | Admitting: Internal Medicine

## 2015-07-29 NOTE — Progress Notes (Signed)
HPI: 50 year old female for evaluation of chest pain. Patient has a question history of mitral valve prolapseBut no other cardiac history. Over the past year she has had occasional pressure in her chest. Initially was on the left side but now is on the right side. It occurs during times of stress. It is not pleuritic, positional or exertional. Lasts several seconds and resolves. No associated symptoms. She denies dyspnea on exertion, orthopnea, PND, pedal edema or syncope. She exercises without having significant chest pain.   Current Outpatient Prescriptions  Medication Sig Dispense Refill  . azelastine (ASTELIN) 0.1 % nasal spray Place 2 sprays into both nostrils 2 (two) times daily. Use in each nostril as directed 30 mL 6  . fluticasone (FLONASE) 50 MCG/ACT nasal spray Place 2 sprays into both nostrils daily. 16 g 6   No current facility-administered medications for this visit.    Allergies  Allergen Reactions  . Aspirin Shortness Of Breath    "irritated stomach" Ibuprofen and Naproxen do not agree with her either  . Latex Shortness Of Breath and Itching  . Other Shortness Of Breath    peanuts  . Sulfamethoxazole-Trimethoprim Shortness Of Breath  . Sulfonamide Derivatives Shortness Of Breath and Itching  . Sulphadimidine Sodium [Sulfamethazine Sodium] Shortness Of Breath    Added to food for preservative  . Amoxicillin     Doesn't remember rxn  . Carisoprodol   . Ciprofloxacin     REACTION: SOB, "Tightness in head"  . Clarithromycin     "Doesn't agree with me"  . Codeine     Does not take percocet or hydrocodone; tylenol only  . Epinephrine     Heart racing  . Metronidazole Other (See Comments)    "cannot tolerate"  . Nsaids   . Penicillins     Doesn't remember reaction     Past Medical History  Diagnosis Date  . Mitral valve disorders   . Contact dermatitis and eczema   . Esophageal reflux   . Lumbago   . IBS (irritable bowel syndrome)   . Anxiety   .  Anemia   . IC (interstitial cystitis)   . Internal hemorrhoids   . Diverticulitis     CT Scan  . Asthma   . Hyperlipidemia, mixed   . Allergic rhinitis   . Osteoarthritis 07/08/2015    Past Surgical History  Procedure Laterality Date  . Cesarean section    . Sinus surgery with instatrak    . Mouth surgery    . Dilitation & currettage/hystroscopy with novasure ablation N/A 10/14/2012    Procedure: DILATATION & CURETTAGE/HYSTEROSCOPY WITH NOVASURE ABLATION;  Surgeon: Lovenia Kim, MD;  Location: [facility];  Service: Gynecology;  Laterality: N/A;  . Bartholin cyst marsupialization N/A 12/18/2013    Procedure: BARTHOLIN CYST MARSUPIALIZATION WITH BIOPSY;  Surgeon: Lovenia Kim, MD;  Location: [facility];  Service: Gynecology;  Laterality: N/A;    Social History   Social History  . Marital Status: Married    Spouse Name: N/A  . Number of Children: 2  . Years of Education: N/A   Occupational History  . [employer]     Social History Main Topics  . Smoking status: Never Smoker   . Smokeless tobacco: Never Used  . Alcohol Use: No  . Drug Use: No  . 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  Not on file   Social History Narrative    Family History  Problem Relation Age of Onset  . Colon polyps Sister   . Mental illness Sister     anxiety from 9/11 survivors  . Varicose Veins Sister   . Diabetes Mother   . Hypertension Mother   . Heart attack Mother     MI at age 71  . Alcohol abuse Mother     smoker  . Heart disease Mother   . Dementia Mother   . Hypertension Father   . Hyperlipidemia Father   . Heart attack Father   . Cancer Father     lung cancer, smoker  . Alcohol abuse      Family history  . Colon cancer Neg Hx   . Cancer Brother     acute leukemia  . Dementia Maternal Grandmother   . Heart disease Maternal Grandfather     mi  . Cancer Paternal Grandmother      GYN cancer  . Other Sister     hypoglycemia  . Varicose Veins Sister   . Asthma Sister     rheumatoid  . Fibromyalgia Sister   . Mental retardation Sister     depression  . GER disease Son     ROS: no fevers or chills, productive cough, hemoptysis, dysphasia, odynophagia, melena, hematochezia, dysuria, hematuria, rash, seizure activity, orthopnea, PND, pedal edema, claudication. Remaining systems are negative.  Physical Exam:   Blood pressure 120/66, pulse 89, height 4\' 11"  (1.499 m), weight 98 lb (44.453 kg), last menstrual period 06/13/2015.  General:  Well developed/well nourished in NAD Skin warm/dry Patient not depressed No peripheral clubbing Back-normal HEENT-normal/normal eyelids Neck supple/normal carotid upstroke bilaterally; no bruits; no JVD; no thyromegaly chest - CTA/ normal expansion CV - RRR/normal S1 and S2; no murmurs, rubs or gallops;  PMI nondisplaced Abdomen -NT/ND, no HSM, no mass, + bowel sounds, no bruit 2+ femoral pulses, no bruits Ext-no edema, chords, 2+ DP Neuro-grossly nonfocal  ECG 07/04/2015-sinus rhythm and no ST changes. Electrocardiogram today shows sinus rhythm with no ST changes.

## 2015-07-31 ENCOUNTER — Ambulatory Visit (INDEPENDENT_AMBULATORY_CARE_PROVIDER_SITE_OTHER): Payer: 59 | Admitting: Cardiology

## 2015-07-31 ENCOUNTER — Encounter: Payer: Self-pay | Admitting: Cardiology

## 2015-07-31 VITALS — BP 120/66 | HR 89 | Ht 59.0 in | Wt 98.0 lb

## 2015-07-31 DIAGNOSIS — R079 Chest pain, unspecified: Secondary | ICD-10-CM | POA: Diagnosis not present

## 2015-07-31 DIAGNOSIS — E785 Hyperlipidemia, unspecified: Secondary | ICD-10-CM

## 2015-07-31 DIAGNOSIS — R0789 Other chest pain: Secondary | ICD-10-CM

## 2015-07-31 DIAGNOSIS — I059 Rheumatic mitral valve disease, unspecified: Secondary | ICD-10-CM | POA: Diagnosis not present

## 2015-07-31 NOTE — Patient Instructions (Signed)
Your physician has requested that you have an echocardiogram. Echocardiography is a painless test that uses sound waves to create images of your heart. It provides your doctor with information about the size and shape of your heart and how well your heart's chambers and valves are working. This procedure takes approximately one hour. There are no restrictions for this procedure.   Your physician recommends that you schedule a follow-up appointment in: AS NEEDED PENDING TEST RESULTS

## 2015-07-31 NOTE — Assessment & Plan Note (Signed)
Question history of mitral valve prolapse. I do not hear a click on examination. Schedule echocardiogram to further assess.

## 2015-07-31 NOTE — Assessment & Plan Note (Signed)
Management per primary care. 

## 2015-07-31 NOTE — Assessment & Plan Note (Signed)
Symptoms extremely atypical and unlikely to be cardiac related. I will arrange an echocardiogram to assess LV function and wall motion. This will also screen for mitral valve prolapse. If normal no plans for further cardiac evaluation at this point.

## 2015-08-01 ENCOUNTER — Ambulatory Visit (INDEPENDENT_AMBULATORY_CARE_PROVIDER_SITE_OTHER): Payer: 59 | Admitting: Internal Medicine

## 2015-08-01 ENCOUNTER — Ambulatory Visit (INDEPENDENT_AMBULATORY_CARE_PROVIDER_SITE_OTHER)
Admission: RE | Admit: 2015-08-01 | Discharge: 2015-08-01 | Disposition: A | Discharge status: Home / Self Care | Payer: 59 | Source: Ambulatory Visit | Attending: Internal Medicine | Admitting: Internal Medicine

## 2015-08-01 ENCOUNTER — Encounter: Payer: Self-pay | Admitting: Internal Medicine

## 2015-08-01 VITALS — BP 114/70 | HR 76 | Ht 59.0 in | Wt 100.4 lb

## 2015-08-01 DIAGNOSIS — R0600 Dyspnea, unspecified: Secondary | ICD-10-CM

## 2015-08-01 DIAGNOSIS — R0981 Nasal congestion: Secondary | ICD-10-CM | POA: Diagnosis not present

## 2015-08-01 DIAGNOSIS — J4520 Mild intermittent asthma, uncomplicated: Secondary | ICD-10-CM | POA: Diagnosis not present

## 2015-08-01 NOTE — Patient Instructions (Signed)
Order- CXR   Dx dyspnea   Order- Office Spirometry    Dx dyspnea
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
## 2015-08-08 ENCOUNTER — Telehealth: Payer: Self-pay | Admitting: Internal Medicine

## 2015-08-08 NOTE — Telephone Encounter (Signed)
Notes Recorded by Deneise Lever, MD on 08/02/2015 at 1:42 PM CXR- clear with no evidence of pneumonia  LVM for pt to return call.

## 2015-08-12 NOTE — Telephone Encounter (Signed)
Results have been explained to patient, pt expressed understanding. Nothing further needed.  

## 2015-08-14 NOTE — Assessment & Plan Note (Signed)
Not sure "heaviness" is describing asthma. Plan-consider scheduling methacholine inhalation challenge if this doesn't clear. CXR. Await results of echocardiogram.

## 2015-08-14 NOTE — Assessment & Plan Note (Signed)
She is not describing nasal congestion or drainage to go along with the "heavy" chest feeling she sustained after her flulike illness

## 2015-08-28 ENCOUNTER — Telehealth: Payer: Self-pay | Admitting: Family Medicine

## 2015-08-28 ENCOUNTER — Ambulatory Visit (HOSPITAL_BASED_OUTPATIENT_CLINIC_OR_DEPARTMENT_OTHER)
Admission: RE | Admit: 2015-08-28 | Discharge: 2015-08-28 | Disposition: A | Discharge status: Home / Self Care | Payer: 59 | Source: Ambulatory Visit | Attending: Cardiology | Admitting: Cardiology

## 2015-08-28 ENCOUNTER — Ambulatory Visit (HOSPITAL_BASED_OUTPATIENT_CLINIC_OR_DEPARTMENT_OTHER)
Admission: RE | Admit: 2015-08-28 | Discharge: 2015-08-28 | Disposition: A | Discharge status: Home / Self Care | Payer: 59 | Source: Ambulatory Visit | Attending: Family Medicine | Admitting: Family Medicine

## 2015-08-28 DIAGNOSIS — M5441 Lumbago with sciatica, right side: Secondary | ICD-10-CM | POA: Insufficient documentation

## 2015-08-28 DIAGNOSIS — E785 Hyperlipidemia, unspecified: Secondary | ICD-10-CM | POA: Diagnosis not present

## 2015-08-28 DIAGNOSIS — I5189 Other ill-defined heart diseases: Secondary | ICD-10-CM | POA: Diagnosis not present

## 2015-08-28 DIAGNOSIS — M5442 Lumbago with sciatica, left side: Principal | ICD-10-CM

## 2015-08-28 DIAGNOSIS — R29898 Other symptoms and signs involving the musculoskeletal system: Secondary | ICD-10-CM | POA: Diagnosis not present

## 2015-08-28 DIAGNOSIS — M25561 Pain in right knee: Secondary | ICD-10-CM | POA: Diagnosis not present

## 2015-08-28 DIAGNOSIS — I340 Nonrheumatic mitral (valve) insufficiency: Secondary | ICD-10-CM | POA: Diagnosis not present

## 2015-08-28 DIAGNOSIS — M25562 Pain in left knee: Secondary | ICD-10-CM

## 2015-08-28 DIAGNOSIS — R079 Chest pain, unspecified: Secondary | ICD-10-CM | POA: Insufficient documentation

## 2015-08-28 NOTE — Telephone Encounter (Signed)
Jeri with [facility] state order for Bilateral leg [place], should be changed to [id number] Bilateral.

## 2015-08-28 NOTE — Progress Notes (Signed)
  Echocardiogram 2D Echocardiogram has been performed.  Darlina Sicilian M 08/28/2015, 9:16 AM

## 2015-08-29 ENCOUNTER — Other Ambulatory Visit: Payer: Self-pay | Admitting: Family Medicine

## 2015-08-29 DIAGNOSIS — I739 Peripheral vascular disease, unspecified: Secondary | ICD-10-CM

## 2015-09-02 ENCOUNTER — Ambulatory Visit (HOSPITAL_COMMUNITY)
Admission: RE | Admit: 2015-09-02 | Discharge: 2015-09-02 | Disposition: A | Discharge status: Home / Self Care | Payer: 59 | Source: Ambulatory Visit | Attending: Family Medicine | Admitting: Family Medicine

## 2015-09-02 ENCOUNTER — Ambulatory Visit (HOSPITAL_COMMUNITY): Payer: 59

## 2015-09-02 ENCOUNTER — Other Ambulatory Visit (HOSPITAL_COMMUNITY): Payer: Self-pay | Admitting: Family Medicine

## 2015-09-02 ENCOUNTER — Ambulatory Visit (HOSPITAL_BASED_OUTPATIENT_CLINIC_OR_DEPARTMENT_OTHER)
Admission: RE | Admit: 2015-09-02 | Discharge: 2015-09-02 | Disposition: A | Discharge status: Home / Self Care | Payer: 59 | Source: Ambulatory Visit

## 2015-09-02 ENCOUNTER — Other Ambulatory Visit: Payer: Self-pay | Admitting: Family Medicine

## 2015-09-02 DIAGNOSIS — I739 Peripheral vascular disease, unspecified: Secondary | ICD-10-CM

## 2015-09-02 NOTE — Progress Notes (Signed)
VASCULAR LAB PRELIMINARY  ARTERIAL  ABI completed:  ABIs and pedal waveforms normal.    RIGHT    LEFT    PRESSURE WAVEFORM  PRESSURE WAVEFORM  BRACHIAL 105 Triphasic  BRACHIAL 105 Triphasic   DP 122 Triphasic  DP 116 Triphasic   AT   AT    PT 128 Triphasic  PT 138 Triphasic   PER   PER    GREAT TOE  NA GREAT TOE  NA    RIGHT LEFT  ABI 0.22 1.31     Ahlana Slaydon, RVT 09/02/2015, 5:21 PM

## 2015-09-03 ENCOUNTER — Other Ambulatory Visit: Payer: Self-pay | Admitting: Family Medicine

## 2015-09-03 DIAGNOSIS — I739 Peripheral vascular disease, unspecified: Secondary | ICD-10-CM

## 2015-09-12 ENCOUNTER — Ambulatory Visit (INDEPENDENT_AMBULATORY_CARE_PROVIDER_SITE_OTHER): Payer: 59 | Admitting: Family Medicine

## 2015-09-12 VITALS — BP 112/80 | HR 84 | Temp 99.1°F | Ht 59.0 in | Wt 101.2 lb

## 2015-09-12 DIAGNOSIS — K589 Irritable bowel syndrome without diarrhea: Secondary | ICD-10-CM | POA: Diagnosis not present

## 2015-09-12 DIAGNOSIS — J0110 Acute frontal sinusitis, unspecified: Secondary | ICD-10-CM | POA: Diagnosis not present

## 2015-09-12 DIAGNOSIS — R55 Syncope and collapse: Secondary | ICD-10-CM | POA: Diagnosis not present

## 2015-09-12 DIAGNOSIS — N920 Excessive and frequent menstruation with regular cycle: Secondary | ICD-10-CM | POA: Diagnosis not present

## 2015-09-12 MED ORDER — AZITHROMYCIN 250 MG PO TABS: ORAL_TABLET | ORAL | Status: DC

## 2015-09-12 MED ORDER — DICYCLOMINE HCL 20 MG PO TABS: 20.0000 mg | ORAL_TABLET | Freq: Four times a day (QID) | ORAL | Status: DC

## 2015-09-12 NOTE — Patient Instructions (Signed)
Use the azithromycin for your sinus infection (zpack) Try the bentyl as needed for IBS symptoms Please do follow-up with your OB-GYN about your menstrual bleeding It sounds as though you had a vagal episode- this is fainting due to the sight of blood or other stressors.  This is harmless- let me know if you have any further fainting episodes

## 2015-09-12 NOTE — Progress Notes (Signed)
[facility] [address] [place], [place] [address] [phone]  Date:  09/12/2015   Name:  [name]   DOB:  07/27/1965   MRN:  [id number]  PCP:  Penni Homans, MD    Chief Complaint: Loss of Consciousness and Gas   History of Present Illness:  [name] is a 50 y.o. very pleasant female patient who presents with the following: Several concerns today About 10 days ago she cut her finger on a can lid. It was bleeding and she started to feel faint, then passed out- had a vagal reaction.  Her husband helped her to the floor so she did not get hurt. She came too, and felt like she needed to get to the bathroom urgently for a BM.  However since she had just fainted she was not moving very fast, still felt tired and weak.  She vomited and also had some BM in her pants. She was not sure if this incident needs further evaluation.  Wonders if this means she had a seizure or a "mini stroke".  She did not have any observe seizure   She had several episodes of vagal fainting when she was a child.  Has not had this she became an adult  Also she has noted a feeling of "heat passing through my body" in her left lower belly for about one month. It may happen sporadically.  No pattern that she can ascertain,.  Not painful.  No associated with eating.   Her most recent menses were relatively heavy- she did have an ablation and does not typically have much bleeding  Her menses ended on Friday- she has noted some spotting from her last period still sometimes when she wipes.  she does have an OBG.  She was told that she had some fibroids in the past and wonders if they are getting larger    She also notes abd cramping and gurgling sounds in her belly- these sx have been worse for about 1 month. She will feel urgency and has soft stools. No vomiting but she may feel nauseated some of the time. She does have IBS.   She is able to eat  ok   She also notes that her sinuses are congested for 2 weeks- she is blowing clear material out of her nose.  She has tried some flonase but still has the sx No earache, no ST No fever, no cough  Patient Active Problem List   Diagnosis Date Noted  . Osteoarthritis 07/08/2015  . Acute upper respiratory infection 05/30/2015  . Hematuria 01/20/2015  . Need for prophylactic vaccination and inoculation against influenza 01/20/2015  . Hyperlipidemia, mild 01/20/2015  . Head congestion 01/20/2015  . Fatigue 10/24/2014  . Chest pressure 10/24/2014  . Sinusitis, acute maxillary 10/11/2014  . Preventative health care 06/03/2014  . TMJ arthralgia 08/20/2013  . Diverticulosis 07/01/2011  . Perimenopausal 03/25/2011  . Fibroids 10/24/2010  . FLATULENCE-GAS-BLOATING 05/26/2010  . Anemia 03/25/2010  . IRRITABLE BOWEL SYNDROME 03/25/2010  . Mitral valve disorder 07/09/2007  . Asthma, exogenous 07/09/2007  . ESOPHAGEAL REFLUX 07/09/2007  . DERMATITIS 07/09/2007  . Allergic rhinitis due to pollen 12/14/2006  . LOW BACK PAIN 12/14/2006    Past Medical History  Diagnosis Date  . Mitral valve disorders   . Contact dermatitis and eczema   . Esophageal reflux   . Lumbago   . IBS (irritable bowel syndrome)   .  Anxiety   . Anemia   . IC (interstitial cystitis)   . Internal hemorrhoids   . Diverticulitis     CT Scan  . Asthma   . Hyperlipidemia, mixed   . Allergic rhinitis   . Osteoarthritis 07/08/2015    Past Surgical History  Procedure Laterality Date  . Cesarean section    . Sinus surgery with instatrak    . Mouth surgery    . Dilitation & currettage/hystroscopy with novasure ablation N/A 10/14/2012    Procedure: DILATATION & CURETTAGE/HYSTEROSCOPY WITH NOVASURE ABLATION;  Surgeon: Lovenia Kim, MD;  Location: [facility];  Service: Gynecology;  Laterality: N/A;  . Bartholin cyst marsupialization N/A 12/18/2013    Procedure: BARTHOLIN CYST MARSUPIALIZATION WITH BIOPSY;  Surgeon:  Lovenia Kim, MD;  Location: [facility];  Service: Gynecology;  Laterality: N/A;    Social History  Substance Use Topics  . Smoking status: Never Smoker   . Smokeless tobacco: Never Used  . Alcohol Use: No    Family History  Problem Relation Age of Onset  . Colon polyps Sister   . Mental illness Sister     anxiety from 9/11 survivors  . Varicose Veins Sister   . Diabetes Mother   . Hypertension Mother   . Heart attack Mother     MI at age 30  . Alcohol abuse Mother     smoker  . Heart disease Mother   . Dementia Mother   . Hypertension Father   . Hyperlipidemia Father   . Heart attack Father   . Cancer Father     lung cancer, smoker  . Alcohol abuse      Family history  . Colon cancer Neg Hx   . Cancer Brother     acute leukemia  . Dementia Maternal Grandmother   . Heart disease Maternal Grandfather     mi  . Cancer Paternal Grandmother     GYN cancer  . Other Sister     hypoglycemia  . Varicose Veins Sister   . Asthma Sister     rheumatoid  . Fibromyalgia Sister   . Mental retardation Sister     depression  . GER disease Son     Allergies  Allergen Reactions  . Aspirin Shortness Of Breath    "irritated stomach" Ibuprofen and Naproxen do not agree with her either  . Latex Shortness Of Breath and Itching  . Other Shortness Of Breath    peanuts  . Sulfamethoxazole-Trimethoprim Shortness Of Breath  . Sulfonamide Derivatives Shortness Of Breath and Itching  . Sulphadimidine Sodium [Sulfamethazine Sodium] Shortness Of Breath    Added to food for preservative  . Amoxicillin     Doesn't remember rxn  . Carisoprodol   . Ciprofloxacin     REACTION: SOB, "Tightness in head"  . Clarithromycin     "Doesn't agree with me"  . Codeine     Does not take percocet or hydrocodone; tylenol only  . Epinephrine     Heart racing  . Metronidazole Other (See Comments)    "cannot tolerate"  . Nsaids   . Penicillins     Doesn't remember reaction    Medication list  has been reviewed and updated.  Current Outpatient Prescriptions on File Prior to Visit  Medication Sig Dispense Refill  . azelastine (ASTELIN) 0.1 % nasal spray Place 2 sprays into both nostrils 2 (two) times daily. Use in each nostril as directed 30 mL 6  . fluticasone (FLONASE) 50 MCG/ACT nasal  spray Place 2 sprays into both nostrils daily. 16 g 6   No current facility-administered medications on file prior to visit.    Review of Systems:  As per HPI- otherwise negative.   Physical Examination: Filed Vitals:   09/12/15 1712  BP: 112/80  Pulse: 84  Temp: 99.1 F (37.3 C)   Filed Vitals:   09/12/15 1712  Height: 4\' 11"  (1.499 m)  Weight: 101 lb 3.2 oz (45.904 kg)   Body mass index is 20.43 kg/(m^2). Ideal Body Weight: Weight in (lb) to have BMI = 25: 123.5  GEN: WDWN, NAD, Non-toxic, A & O x 3, looks well, petite build HEENT: Atraumatic, Normocephalic. Neck supple. No masses, No LAD.  Bilateral TM wnl, oropharynx normal.  PEERL,EOMI.  Sinuses feel feel to percussion.  Nasal cavity normal.  No meningismus Ears and Nose: No external deformity. CV: RRR, No M/G/R. No JVD. No thrill. No extra heart sounds. PULM: CTA B, no wheezes, crackles, rhonchi. No retractions. No resp. distress. No accessory muscle use. ABD: S, NT, ND, +BS. No rebound. No HSM.  Benign belly EXTR: No c/c/e NEURO Normal gait.  PSYCH: Normally interactive. Conversant. Not depressed or anxious appearing.  Calm demeanor.    Assessment and Plan: Acute frontal sinusitis, recurrence not specified - Plan: azithromycin (ZITHROMAX) 250 MG tablet  IBS (irritable bowel syndrome) - Plan: dicyclomine (BENTYL) 20 MG tablet  Menorrhagia with regular cycle  Vaso vagal episode  Here today with 4 new concerns.  I spent over 45 min with pt face to face, over 50% in counseling and education Will treat for her sinusitis with azithromycin. She is able to tolerate this medication although she is allergic to 15 different  agents Worsening of IBS symptoms: recent CMP was virtually normal.  Will have her try bentyl as needed.  For stomach gurgling suggested OTC tums Heavier menses: she has a history of ablation and menses are generally light.  She will contact her OBG to arrange a follow-up appt.  May need a repeat US.  Recent hg normal Vagal fainting episode: discussed with pt in detail.  It sounds as though she had a classic vaso vagal episode.  Reassured that this does not meant that she had a seizure or a stroke.  She will watch for any further episodes but this is almost certainly a benign incident   Signed Lamar Blinks, MD

## 2015-09-12 NOTE — Progress Notes (Signed)
Pre visit review using our clinic review tool, if applicable. No additional management support is needed unless otherwise documented below in the visit note. 

## 2015-10-16 ENCOUNTER — Telehealth: Payer: Self-pay | Admitting: Family Medicine
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
## 2015-10-16 NOTE — Telephone Encounter (Signed)
Would be willing to meet her in at 7:30 for a 15 minute appt.

## 2015-10-17 ENCOUNTER — Encounter: Payer: Self-pay | Admitting: Family Medicine

## 2015-10-17 ENCOUNTER — Ambulatory Visit (INDEPENDENT_AMBULATORY_CARE_PROVIDER_SITE_OTHER): Payer: 59 | Admitting: Family Medicine

## 2015-10-17 DIAGNOSIS — T148 Other injury of unspecified body region: Secondary | ICD-10-CM

## 2015-10-17 DIAGNOSIS — T07XXXA Unspecified multiple injuries, initial encounter: Secondary | ICD-10-CM
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
## 2015-10-17 NOTE — Progress Notes (Signed)
Pre visit review using our clinic review tool, if applicable. No additional management support is needed unless otherwise documented below in the visit note. 

## 2015-10-18 ENCOUNTER — Telehealth: Payer: Self-pay | Admitting: Family Medicine
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
## 2015-10-18 NOTE — Telephone Encounter (Signed)
Pt called back and verified that she will have her husband come by and pick up her work note today. Informed the pt that a copy of her work note would be left at the front.

## 2015-10-18 NOTE — Telephone Encounter (Signed)
Ok, that is fine

## 2015-10-18 NOTE — Telephone Encounter (Signed)
Work note faxed to [employer] - [phone]. Tried to contact pt to see if she plans on picking up a copy of her work note in the office today. Left message for pt to return call.

## 2015-10-24 ENCOUNTER — Other Ambulatory Visit: Payer: Self-pay | Admitting: Family Medicine

## 2015-10-24 ENCOUNTER — Telehealth: Payer: Self-pay | Admitting: Family Medicine

## 2015-10-24 DIAGNOSIS — R0789 Other chest pain: Secondary | ICD-10-CM
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
## 2015-11-05 ENCOUNTER — Telehealth: Payer: Self-pay | Admitting: Family Medicine

## 2015-11-05 NOTE — Telephone Encounter (Signed)
Relation to [id number] Call back [phone] Pharmacy:  Reason for call:  Patient requesting a tens unit for pain she's experiencing in her fingers and would like a letter for work excusing her from work until 11/20/15 and would like to [employer].

## 2015-11-05 NOTE — Telephone Encounter (Signed)
With regulations we need a face to face visit to write her an rx for a TENS unit. She is also better covered with work if she comes in. Most work places will not accept a note to keep someone out of work if I have not seen the patient

## 2015-11-05 NOTE — Telephone Encounter (Signed)
Patient was seen by Dr. Lorelei Pont for issues please advise

## 2015-11-06 ENCOUNTER — Telehealth: Payer: Self-pay | Admitting: Emergency Medicine

## 2015-11-06 NOTE — Telephone Encounter (Signed)
Will forward to Dr. Lorelei Pont.

## 2015-11-06 NOTE — Telephone Encounter (Signed)
Please give her a call- I would need to see her to discuss keeping her out of work for this length of time.  Will also need eval for TENS unit

## 2015-11-06 NOTE — Telephone Encounter (Signed)
Tried to contact pt to schedule appointment to be seen by Dr. Lorelei Pont for TENS unit evaluation and to to discuss pt's request to extend time out of work until 11/20/15.

## 2015-11-07 NOTE — Telephone Encounter (Signed)
2nd attempt to reach pt. Left message for patient to return call to schedule appt with Dr. Lorelei Pont.

## 2015-11-14 ENCOUNTER — Ambulatory Visit (INDEPENDENT_AMBULATORY_CARE_PROVIDER_SITE_OTHER): Payer: 59 | Admitting: Family Medicine

## 2015-11-14 ENCOUNTER — Encounter: Payer: Self-pay | Admitting: Family Medicine

## 2015-11-14 VITALS — BP 110/80 | HR 69 | Temp 98.2°F | Ht 59.0 in | Wt 98.2 lb

## 2015-11-14 DIAGNOSIS — T148 Other injury of unspecified body region: Secondary | ICD-10-CM | POA: Diagnosis not present

## 2015-11-14 DIAGNOSIS — R0981 Nasal congestion: Secondary | ICD-10-CM | POA: Diagnosis not present

## 2015-11-14 DIAGNOSIS — T07XXXA Unspecified multiple injuries, initial encounter: Secondary | ICD-10-CM

## 2015-11-14 MED ORDER — AZITHROMYCIN 250 MG PO TABS: ORAL_TABLET | ORAL | Status: DC
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
## 2015-11-14 NOTE — Progress Notes (Signed)
Pre visit review using our clinic review tool, if applicable. No additional management support is needed unless otherwise documented below in the visit note. 

## 2015-11-18 ENCOUNTER — Ambulatory Visit: Payer: 59 | Attending: Family Medicine | Admitting: Physical Therapy

## 2015-11-18 DIAGNOSIS — M542 Cervicalgia: Secondary | ICD-10-CM | POA: Insufficient documentation

## 2015-11-18 DIAGNOSIS — R293 Abnormal posture: Secondary | ICD-10-CM

## 2015-11-18 DIAGNOSIS — M546 Pain in thoracic spine: Secondary | ICD-10-CM
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
## 2015-11-21 ENCOUNTER — Ambulatory Visit: Payer: 59

## 2015-11-21 DIAGNOSIS — M542 Cervicalgia: Secondary | ICD-10-CM

## 2015-11-21 DIAGNOSIS — M546 Pain in thoracic spine: Secondary | ICD-10-CM

## 2015-11-21 DIAGNOSIS — R293 Abnormal posture: Secondary | ICD-10-CM

## 2015-11-21 NOTE — Therapy (Signed)
[facility] [address]  [place], [place], [address] Phone: [phone]   Fax:  [phone]  Physical Therapy Treatment  Patient Details  Name: [name] MRN: [id number] Date of Birth: [birth year] Referring Provider: Penni Homans, MD  Encounter Date: 11/21/2015      PT End of Session - 11/21/15 1828    Visit Number 2   Number of Visits 12   Date for PT Re-Evaluation 01/03/16   PT Start Time [id number]   PT Stop Time 1755   PT Time Calculation (min) 51 min   Activity Tolerance Patient tolerated treatment well   Behavior During Therapy [facility] for tasks assessed/performed      Past Medical History:  Diagnosis Date  . Allergic rhinitis   . Anemia   . Anxiety   . Asthma   . Contact dermatitis and eczema   . Diverticulitis    CT Scan  . Esophageal reflux   . Hyperlipidemia, mixed   . IBS (irritable bowel syndrome)   . IC (interstitial cystitis)   . Internal hemorrhoids   . Lumbago   . Mitral valve disorders   . Osteoarthritis 07/08/2015    Past Surgical History:  Procedure Laterality Date  . BARTHOLIN CYST MARSUPIALIZATION N/A 12/18/2013   Procedure: BARTHOLIN CYST MARSUPIALIZATION WITH BIOPSY;  Surgeon: Lovenia Kim, MD;  Location: [facility];  Service: Gynecology;  Laterality: N/A;  . CESAREAN SECTION    . DILITATION & CURRETTAGE/HYSTROSCOPY WITH NOVASURE ABLATION N/A 10/14/2012   Procedure: DILATATION & CURETTAGE/HYSTEROSCOPY WITH NOVASURE ABLATION;  Surgeon: Lovenia Kim, MD;  Location: [facility];  Service: Gynecology;  Laterality: N/A;  . MOUTH SURGERY    . SINUS SURGERY WITH INSTATRAK      There were no vitals filed for this visit.      Subjective Assessment - 11/21/15 1824    Subjective Pt. reports her initial neck pain is a 7/10 today.  Pt. reports she has been tense in the shoulder blades since last treatment.     Patient Stated Goals "To be able to work w/o pain and to get back to normal  workout at the gym"   Currently in Pain? Yes   Pain Score 7    Pain Location Neck   Pain Orientation Mid   Pain Descriptors / Indicators Tightness   Pain Type Acute pain   Pain Onset More than a month ago   Multiple Pain Sites No      Today's Treatment:  HEP review: B seated lev. Scapular, UT x 30 sec each side  Doorway stretch x 30 sec  Standing scapular green TB x 10 reps   Therex: Standing scapular/extension red TB x 10 reps   Manual:  R medial / superior scapular border TPR B UT STM x   Modalities: IFC E-stim. to medial / superior scapular border  Moist heat: to cervical spine: seated   * pt. Reports she is currently pain         PT Short Term Goals - 11/21/15 1829      PT SHORT TERM GOAL #1   Title Independent with initial HEP by 12/06/15   Status On-going           PT Long Term Goals - 11/21/15 1830      PT LONG TERM GOAL #1   Title Independent with advanced HEP/gym program as indicated by 01/03/16   Status On-going     PT LONG TERM GOAL #2  Title Pt will routinely demonstrate neutral spine and shoulder posture w/o cues by 01/03/16   Status On-going     PT LONG TERM GOAL #3   Title Pt will report ability to sit at work desk for >/= without limitation due to neck/upperthoracic pain by 01/03/16   Status On-going     PT LONG TERM GOAL #4   Title Pt will return to normal gym workout with good awareness of proper body mechanics and no increased neck/upper thoracic pain by 01/03/16   Status On-going     PT LONG TERM GOAL #5   Status On-going               Plan - 11/21/15 1830    Clinical Impression Statement Pt. reports her initial neck pain is a 7/10 today.  Pt. reports she has been tense in the shoulder blades since last treatment however reported decreased neck tension and pain following therex and 0/10 neck pain following application of moist heat /E-stim.  Plan to assess response to HEP next treatment.   PT Treatment/Interventions  Patient/family education;Therapeutic exercise;Manual techniques;Passive range of motion;Taping;Dry needling;Electrical Stimulation;Moist Heat;Ultrasound;Cryotherapy;Traction;Iontophoresis 4mg /ml Dexamethasone;Neuromuscular re-education;ADLs/Self Care Home Management   PT Next Visit Plan assess response to HEP (due to pt. not performing following Eval.); F/u regarding insurance coverage for TENS unit - if not covered, provide info for TENS 7000 2nd edition; Postural training; Scapular strengthening/stabilization; Manual therapy & Modalites PRN for pain      Patient will benefit from skilled therapeutic intervention in order to improve the following deficits and impairments:  Pain, Postural dysfunction, Increased muscle spasms, Impaired flexibility, Decreased range of motion, Decreased strength  Visit Diagnosis: Cervicalgia  Pain in thoracic spine  Abnormal posture     Problem List Patient Active Problem List   Diagnosis Date Noted  . Osteoarthritis 07/08/2015  . Acute upper respiratory infection 05/30/2015  . Hematuria 01/20/2015  . Need for prophylactic vaccination and inoculation against influenza 01/20/2015  . Hyperlipidemia, mild 01/20/2015  . Head congestion 01/20/2015  . Fatigue 10/24/2014  . Chest pressure 10/24/2014  . Sinusitis, acute maxillary 10/11/2014  . Preventative health care 06/03/2014  . TMJ arthralgia 08/20/2013  . Diverticulosis 07/01/2011  . Perimenopausal 03/25/2011  . Fibroids 10/24/2010  . FLATULENCE-GAS-BLOATING 05/26/2010  . Anemia 03/25/2010  . IRRITABLE BOWEL SYNDROME 03/25/2010  . Mitral valve disorder 07/09/2007  . Asthma, exogenous 07/09/2007  . ESOPHAGEAL REFLUX 07/09/2007  . DERMATITIS 07/09/2007  . Allergic rhinitis due to pollen 12/14/2006  . LOW BACK PAIN 12/14/2006    Bess Harvest, PTA 11/21/2015, 6:42 PM  [facility] [address]  [place], [place], [address] Phone:  [phone]   Fax:  [phone]  Name: [name] MRN: [id number] Date of Birth: [birth year]

## 2015-11-26 ENCOUNTER — Ambulatory Visit: Payer: 59 | Admitting: Physical Therapy

## 2015-11-27 ENCOUNTER — Ambulatory Visit: Payer: 59 | Attending: Family Medicine

## 2015-11-27 DIAGNOSIS — M546 Pain in thoracic spine: Secondary | ICD-10-CM

## 2015-11-27 DIAGNOSIS — R293 Abnormal posture: Secondary | ICD-10-CM | POA: Insufficient documentation

## 2015-11-27 DIAGNOSIS — M542 Cervicalgia: Secondary | ICD-10-CM | POA: Diagnosis not present

## 2015-11-27 NOTE — Therapy (Signed)
[facility] [address]  [place], [place], [address] Phone: [phone]   Fax:  [phone]  Physical Therapy Treatment  Patient Details  Name: [name] MRN: [id number] Date of Birth: [birth year] Referring Provider: Penni Homans, MD  Encounter Date: 11/27/2015      PT End of Session - 11/27/15 1726    Visit Number 3   Number of Visits 12   Date for PT Re-Evaluation 01/03/16   PT Start Time [id number]   PT Stop Time 1803   PT Time Calculation (min) 56 min   Activity Tolerance Patient tolerated treatment well   Behavior During Therapy [facility] for tasks assessed/performed      Past Medical History:  Diagnosis Date  . Allergic rhinitis   . Anemia   . Anxiety   . Asthma   . Contact dermatitis and eczema   . Diverticulitis    CT Scan  . Esophageal reflux   . Hyperlipidemia, mixed   . IBS (irritable bowel syndrome)   . IC (interstitial cystitis)   . Internal hemorrhoids   . Lumbago   . Mitral valve disorders   . Osteoarthritis 07/08/2015    Past Surgical History:  Procedure Laterality Date  . BARTHOLIN CYST MARSUPIALIZATION N/A 12/18/2013   Procedure: BARTHOLIN CYST MARSUPIALIZATION WITH BIOPSY;  Surgeon: Lovenia Kim, MD;  Location: [facility];  Service: Gynecology;  Laterality: N/A;  . CESAREAN SECTION    . DILITATION & CURRETTAGE/HYSTROSCOPY WITH NOVASURE ABLATION N/A 10/14/2012   Procedure: DILATATION & CURETTAGE/HYSTEROSCOPY WITH NOVASURE ABLATION;  Surgeon: Lovenia Kim, MD;  Location: [facility];  Service: Gynecology;  Laterality: N/A;  . MOUTH SURGERY    . SINUS SURGERY WITH INSTATRAK      There were no vitals filed for this visit.      Subjective Assessment - 11/27/15 1724    Subjective Pt. pain free initially in the neck today; pt. travelling to [place] and will have to miss 8/10 appointment.     Patient Stated Goals "To be able to work w/o pain and to get back to normal workout at the gym"   Currently in Pain? No/denies   Pain Score 0-No pain   Pain Location Neck   Pain Orientation Mid   Pain Descriptors / Indicators Tightness   Pain Type Acute pain   Pain Onset More than a month ago   Pain Frequency Intermittent   Multiple Pain Sites No       Today's Treatment:  Therex: UBE: level 1.0, 2 min forwards/backwards each   Manual: Seated B UT, levator scap. STM TPR to B medial superior angle of scapula  Therex: Standing scapular retraction with green TB x 10 reps Standing scapular retraction with red TB x 10 reps Laying on 1/2 foam bolster:         B shoulder horizontal abduction with green TB x 15 reps          Cross chest stretch x 2 min   Doorway stretch x 30 sec  Corner stretch x 30 sec   Modalities:  Moist heat to mid back and neck: Hooklying, 10'           PT Short Term Goals - 11/21/15 1829      PT SHORT TERM GOAL #1   Title Independent with initial HEP by 12/06/15   Status On-going           PT Long Term Goals - 11/21/15 1830  PT LONG TERM GOAL #1   Title Independent with advanced HEP/gym program as indicated by 01/03/16   Status On-going     PT LONG TERM GOAL #2   Title Pt will routinely demonstrate neutral spine and shoulder posture w/o cues by 01/03/16   Status On-going     PT LONG TERM GOAL #3   Title Pt will report ability to sit at work desk for >/= without limitation due to neck/upperthoracic pain by 01/03/16   Status On-going     PT LONG TERM GOAL #4   Title Pt will return to normal gym workout with good awareness of proper body mechanics and no increased neck/upper thoracic pain by 01/03/16   Status On-going     PT LONG TERM GOAL #5   Status On-going               Plan - 11/27/15 1728    Clinical Impression Statement Pt. pain free initially in the neck today; pt. travelling to [place] and will have to miss 8/10 appointment.  Today's treatment focused on review of HEP with continued chest stretching and scapular  strengthening activities.  Pt. tolerated these well with pain increase to 5/10 with therex and pain free following moist heat application to neck and mid back at end of treatment.  Pt. reports neck pain is intermittent at this time.     PT Treatment/Interventions Patient/family education;Therapeutic exercise;Manual techniques;Passive range of motion;Taping;Dry needling;Electrical Stimulation;Moist Heat;Ultrasound;Cryotherapy;Traction;Iontophoresis 4mg /ml Dexamethasone;Neuromuscular re-education;ADLs/Self Care Home Management   PT Next Visit Plan Assess response to HEP (due to pt. not performing following Eval.); F/u regarding insurance coverage for TENS unit - if not covered, provide info for TENS 7000 2nd edition; Postural training; Scapular strengthening/stabilization; Manual therapy & Modalites PRN for pain      Patient will benefit from skilled therapeutic intervention in order to improve the following deficits and impairments:  Pain, Postural dysfunction, Increased muscle spasms, Impaired flexibility, Decreased range of motion, Decreased strength  Visit Diagnosis: Cervicalgia  Pain in thoracic spine  Abnormal posture     Problem List Patient Active Problem List   Diagnosis Date Noted  . Osteoarthritis 07/08/2015  . Acute upper respiratory infection 05/30/2015  . Hematuria 01/20/2015  . Need for prophylactic vaccination and inoculation against influenza 01/20/2015  . Hyperlipidemia, mild 01/20/2015  . Head congestion 01/20/2015  . Fatigue 10/24/2014  . Chest pressure 10/24/2014  . Sinusitis, acute maxillary 10/11/2014  . Preventative health care 06/03/2014  . TMJ arthralgia 08/20/2013  . Diverticulosis 07/01/2011  . Perimenopausal 03/25/2011  . Fibroids 10/24/2010  . FLATULENCE-GAS-BLOATING 05/26/2010  . Anemia 03/25/2010  . IRRITABLE BOWEL SYNDROME 03/25/2010  . Mitral valve disorder 07/09/2007  . Asthma, exogenous 07/09/2007  . ESOPHAGEAL REFLUX 07/09/2007  . DERMATITIS  07/09/2007  . Allergic rhinitis due to pollen 12/14/2006  . LOW BACK PAIN 12/14/2006    Bess Harvest, PTA 11/27/2015, 6:17 PM  [facility] [address]  [place], [place], [address] Phone: [phone]   Fax:  [phone]  Name: Mattilynn Whitcomb MRN: [id number] Date of Birth: [birth year]

## 2015-12-03 ENCOUNTER — Ambulatory Visit: Payer: 59 | Admitting: Physical Therapy

## 2015-12-05 ENCOUNTER — Ambulatory Visit: Payer: 59

## 2015-12-05 ENCOUNTER — Telehealth: Payer: Self-pay | Admitting: Family Medicine

## 2015-12-05 DIAGNOSIS — M542 Cervicalgia: Secondary | ICD-10-CM

## 2015-12-05 DIAGNOSIS — M546 Pain in thoracic spine: Secondary | ICD-10-CM

## 2015-12-05 DIAGNOSIS — R293 Abnormal posture: Secondary | ICD-10-CM

## 2015-12-05 NOTE — Therapy (Signed)
[facility] [address]  [place], [place], [address] Phone: [phone]   Fax:  [phone]  Physical Therapy Treatment  Patient Details  Name: [name] MRN: [id number] Date of Birth: November 24, 1965 Referring Provider: Penni Homans, MD  Encounter Date: 12/05/2015      PT End of Session - 12/05/15 1718    Visit Number 4   Number of Visits 12   Date for PT Re-Evaluation 01/03/16   PT Start Time 1707   PT Stop Time 1750   PT Time Calculation (min) 43 min   Activity Tolerance Patient tolerated treatment well   Behavior During Therapy [facility] for tasks assessed/performed      Past Medical History:  Diagnosis Date  . Allergic rhinitis   . Anemia   . Anxiety   . Asthma   . Contact dermatitis and eczema   . Diverticulitis    CT Scan  . Esophageal reflux   . Hyperlipidemia, mixed   . IBS (irritable bowel syndrome)   . IC (interstitial cystitis)   . Internal hemorrhoids   . Lumbago   . Mitral valve disorders   . Osteoarthritis 07/08/2015    Past Surgical History:  Procedure Laterality Date  . BARTHOLIN CYST MARSUPIALIZATION N/A 12/18/2013   Procedure: BARTHOLIN CYST MARSUPIALIZATION WITH BIOPSY;  Surgeon: Lovenia Kim, MD;  Location: [facility];  Service: Gynecology;  Laterality: N/A;  . CESAREAN SECTION    . DILITATION & CURRETTAGE/HYSTROSCOPY WITH NOVASURE ABLATION N/A 10/14/2012   Procedure: DILATATION & CURETTAGE/HYSTEROSCOPY WITH NOVASURE ABLATION;  Surgeon: Lovenia Kim, MD;  Location: [facility];  Service: Gynecology;  Laterality: N/A;  . MOUTH SURGERY    . SINUS SURGERY WITH INSTATRAK      There were no vitals filed for this visit.      Subjective Assessment - 12/05/15 1715    Subjective Pt. reports "tension", in the L sided neck at a 7/10 "discomfort", level initially today.   Patient Stated Goals "To be able to work w/o pain and to get back to normal workout at the gym"   Currently in Pain?  Yes   Pain Score 7    Pain Location Neck   Pain Orientation Mid   Pain Descriptors / Indicators Tightness   Pain Type Acute pain   Pain Onset More than a month ago   Pain Frequency Intermittent   Multiple Pain Sites No        Today's treatment:  Therex: UBE: 2.0 level, 2.5 min forward/ 2.5 min back  HEP Review:  Chin tuck 3" x 15 reps  B UT stretch x 30 sec each side  B lev. Scap. Stretch x 30 sec each side  Shoulder retraction with green TB x 10 reps Doorway stretch x 30 sec   Therex: Shoulder retraction / extension with red TB x 10 reps   Modalities: IFC (80-150 Hz) to B cervical paraspinals and UT's in supine  - intensity to pt tolerance x10' Moist heat pack to B cervical paraspinals and UT's in supine x10' in conjunction with estim.        PT Short Term Goals - 11/21/15 1829      PT SHORT TERM GOAL #1   Title Independent with initial HEP by 12/06/15   Status On-going           PT Long Term Goals - 11/21/15 1830      PT LONG TERM GOAL #1   Title Independent with  advanced HEP/gym program as indicated by 01/03/16   Status On-going     PT LONG TERM GOAL #2   Title Pt will routinely demonstrate neutral spine and shoulder posture w/o cues by 01/03/16   Status On-going     PT LONG TERM GOAL #3   Title Pt will report ability to sit at work desk for >/= without limitation due to neck/upperthoracic pain by 01/03/16   Status On-going     PT LONG TERM GOAL #4   Title Pt will return to normal gym workout with good awareness of proper body mechanics and no increased neck/upper thoracic pain by 01/03/16   Status On-going     PT LONG TERM GOAL #5   Status On-going               Plan - 12/05/15 1718    Clinical Impression Statement Pt. reports "tension", in the L sided neck at a 7/10 "discomfort", level initially today.  Pt. with poor recall and technique with HEP activities today.  Pt. instructed to adhere to HEP for improved benefit from therapy.  Pt.  tolerated all anterior chest stretching and scapular strengthening activity today well with only slight pain increase.  Pt. pain free following E-stim / heat application to neck / thoracic spine.     PT Treatment/Interventions Patient/family education;Therapeutic exercise;Manual techniques;Passive range of motion;Taping;Dry needling;Electrical Stimulation;Moist Heat;Ultrasound;Cryotherapy;Traction;Iontophoresis 4mg /ml Dexamethasone;Neuromuscular re-education;ADLs/Self Care Home Management      Patient will benefit from skilled therapeutic intervention in order to improve the following deficits and impairments:  Pain, Postural dysfunction, Increased muscle spasms, Impaired flexibility, Decreased range of motion, Decreased strength  Visit Diagnosis: Cervicalgia  Pain in thoracic spine  Abnormal posture     Problem List Patient Active Problem List   Diagnosis Date Noted  . Osteoarthritis 07/08/2015  . Acute upper respiratory infection 05/30/2015  . Hematuria 01/20/2015  . Need for prophylactic vaccination and inoculation against influenza 01/20/2015  . Hyperlipidemia, mild 01/20/2015  . Head congestion 01/20/2015  . Fatigue 10/24/2014  . Chest pressure 10/24/2014  . Sinusitis, acute maxillary 10/11/2014  . Preventative health care 06/03/2014  . TMJ arthralgia 08/20/2013  . Diverticulosis 07/01/2011  . Perimenopausal 03/25/2011  . Fibroids 10/24/2010  . FLATULENCE-GAS-BLOATING 05/26/2010  . Anemia 03/25/2010  . IRRITABLE BOWEL SYNDROME 03/25/2010  . Mitral valve disorder 07/09/2007  . Asthma, exogenous 07/09/2007  . ESOPHAGEAL REFLUX 07/09/2007  . DERMATITIS 07/09/2007  . Allergic rhinitis due to pollen 12/14/2006  . LOW BACK PAIN 12/14/2006   * session performed on 8/10 and note closed on 8/14.  Bess Harvest, PTA 12/05/2015, 5:33 PM  [facility] [address]  [place], [place], [address] Phone: [phone]    Fax:  [phone]  Name: [name] MRN: [id number] Date of Birth: 1965-05-01
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
## 2015-12-09 ENCOUNTER — Encounter: Payer: Self-pay | Admitting: Physician Assistant

## 2015-12-09 ENCOUNTER — Telehealth: Payer: Self-pay | Admitting: Family Medicine

## 2015-12-09 ENCOUNTER — Other Ambulatory Visit: Payer: 59

## 2015-12-09 ENCOUNTER — Other Ambulatory Visit: Payer: Self-pay | Admitting: Family Medicine

## 2015-12-09 ENCOUNTER — Ambulatory Visit (INDEPENDENT_AMBULATORY_CARE_PROVIDER_SITE_OTHER): Payer: 59 | Admitting: Physician Assistant

## 2015-12-09 ENCOUNTER — Ambulatory Visit: Payer: 59 | Admitting: Physical Therapy

## 2015-12-09 VITALS — BP 0/0 | Ht 59.0 in | Wt 97.2 lb

## 2015-12-09 DIAGNOSIS — R55 Syncope and collapse: Secondary | ICD-10-CM | POA: Diagnosis not present

## 2015-12-09 DIAGNOSIS — F0781 Postconcussional syndrome: Secondary | ICD-10-CM | POA: Diagnosis not present

## 2015-12-09 DIAGNOSIS — E785 Hyperlipidemia, unspecified: Secondary | ICD-10-CM

## 2015-12-09 DIAGNOSIS — K219 Gastro-esophageal reflux disease without esophagitis: Secondary | ICD-10-CM

## 2015-12-09 LAB — CBC
HCT: 40.7 % (ref 36.0–46.0)
HEMOGLOBIN: 14 g/dL (ref 12.0–15.0)
MCHC: 34.5 g/dL (ref 30.0–36.0)
MCV: 97.8 fl (ref 78.0–100.0)
Platelets: 305 10*3/uL (ref 150.0–400.0)
RBC: 4.16 Mil/uL (ref 3.87–5.11)
RDW: 12.2 % (ref 11.5–15.5)
WBC: 5 10*3/uL (ref 4.0–10.5)

## 2015-12-09 LAB — POCT URINALYSIS DIPSTICK
BILIRUBIN UA: NEGATIVE
GLUCOSE UA: NEGATIVE
KETONES UA: NEGATIVE
Leukocytes, UA: NEGATIVE
Nitrite, UA: NEGATIVE
Protein, UA: NEGATIVE
RBC UA: POSITIVE
SPEC GRAV UA: 1.015
Urobilinogen, UA: 0.2
pH, UA: 7

## 2015-12-09 LAB — COMPREHENSIVE METABOLIC PANEL
ALK PHOS: 63 U/L (ref 39–117)
ALT: 14 U/L (ref 0–35)
AST: 18 U/L (ref 0–37)
Albumin: 4.2 g/dL (ref 3.5–5.2)
BILIRUBIN TOTAL: 0.7 mg/dL (ref 0.2–1.2)
BUN: 16 mg/dL (ref 6–23)
CO2: 31 mEq/L (ref 19–32)
CREATININE: 0.56 mg/dL (ref 0.40–1.20)
Calcium: 9.5 mg/dL (ref 8.4–10.5)
Chloride: 102 mEq/L (ref 96–112)
GFR: 121.48 mL/min (ref >60.00)
GLUCOSE: 102 mg/dL — AB (ref 70–99)
Potassium: 3.9 mEq/L (ref 3.5–5.1)
SODIUM: 138 meq/L (ref 135–145)
TOTAL PROTEIN: 7.1 g/dL (ref 6.0–8.3)

## 2015-12-09 LAB — URINALYSIS, MICROSCOPIC ONLY

## 2015-12-09 LAB — TSH: TSH: 1.26 u[IU]/mL (ref 0.35–4.50)

## 2015-12-09 NOTE — Telephone Encounter (Signed)
FMLA paperwork received and forwarded to Dr. Lorelei Pont for completion.

## 2015-12-09 NOTE — Telephone Encounter (Signed)
Patient called back and scheduled lab appt. Today at 4.
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
## 2015-12-09 NOTE — Patient Instructions (Signed)
Please go to the lab for blood work. I will call with your results.  Please stay very well hydrated and eat a well-balanced diet.  You are not feeding your body enough.  No gym for the next 1-2 weeks. I am writing you work restrictions for post-concussive syndrome.  Follow-up with PCP in 1.5 weeks for reassessment.  Post-Concussion Syndrome Post-concussion syndrome describes the symptoms that can occur after a head injury. These symptoms can last from weeks to months. CAUSES  It is not clear why some head injuries cause post-concussion syndrome. It can occur whether your head injury was mild or severe and whether you were wearing head protection or not.  SIGNS AND SYMPTOMS  Memory difficulties.  Dizziness.  Headaches.  Double vision or blurry vision.  Sensitivity to light.  Hearing difficulties.  Depression.  Tiredness.  Weakness.  Difficulty with concentration.  Difficulty sleeping or staying asleep.  Vomiting.  Poor balance or instability on your feet.  Slow reaction time.  Difficulty learning and remembering things you have heard. DIAGNOSIS  There is no test to determine whether you have post-concussion syndrome. Your health care provider may order an imaging scan of your brain, such as a CT scan, to check for other problems that may be causing your symptoms (such as a severe injury inside your skull). TREATMENT  Usually, these problems disappear over time without medical care. Your health care provider may prescribe medicine to help ease your symptoms. It is important to follow up with a neurologist to evaluate your recovery and address any lingering symptoms or issues. HOME CARE INSTRUCTIONS   Take medicines only as directed by your health care provider. Do not take aspirin. Aspirin can slow blood clotting.  Sleep with your head slightly elevated to help with headaches.  Avoid any situation where there is potential for another head injury. This includes  football, hockey, soccer, basketball, martial arts, downhill snow sports, and horseback riding. Your condition will get worse every time you experience a concussion. You should avoid these activities until you are evaluated by the appropriate follow-up health care providers.  Keep all follow-up visits as directed by your health care provider. This is important. SEEK MEDICAL CARE IF:  You have increased problems paying attention or concentrating.  You have increased difficulty remembering or learning new information.  You need more time to complete tasks or assignments than before.  You have increased irritability or decreased ability to cope with stress.  You have more symptoms than before. Seek medical care if you have any of the following symptoms for more than two weeks after your injury:  Lasting (chronic) headaches.  Dizziness or balance problems.  Nausea.  Vision problems.  Increased sensitivity to noise or light.  Depression or mood swings.  Anxiety or irritability.  Memory problems.  Difficulty concentrating or paying attention.  Sleep problems.  Feeling tired all the time. SEEK IMMEDIATE MEDICAL CARE IF:  You have confusion or unusual drowsiness.  Others find it difficult to wake you up.  You have nausea or persistent, forceful vomiting.  You feel like you are moving when you are not (vertigo). Your eyes may move rapidly back and forth.  You have convulsions or faint.  You have severe, persistent headaches that are not relieved by medicine.  You cannot use your arms or legs normally.  One of your pupils is larger than the other.  You have clear or bloody discharge from your nose or ears.  Your problems are getting worse, not  better. MAKE SURE YOU:  Understand these instructions.  Will watch your condition.  Will get help right away if you are not doing well or get worse.   This information is not intended to replace advice given to you by  your health care provider. Make sure you discuss any questions you have with your health care provider.   Document Released: 10/03/2001 Document Revised: 05/04/2014 Document Reviewed: 07/19/2013 Elsevier Interactive Patient Education [employer].
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
## 2015-12-09 NOTE — Telephone Encounter (Signed)
Tsh, cmp, cbc, lipid for syncope, hyperlipidemia, reflux

## 2015-12-09 NOTE — Telephone Encounter (Signed)
Labs ordered to be done today. Called the patient left message to call back,

## 2015-12-10 ENCOUNTER — Telehealth: Payer: Self-pay | Admitting: Family Medicine

## 2015-12-10 ENCOUNTER — Ambulatory Visit: Payer: 59 | Admitting: Family Medicine

## 2015-12-10 ENCOUNTER — Telehealth: Payer: Self-pay | Admitting: *Deleted

## 2015-12-10 DIAGNOSIS — R8290 Unspecified abnormal findings in urine: Secondary | ICD-10-CM

## 2015-12-10 NOTE — Telephone Encounter (Signed)
I have not seen any paperwork. Am happy to do it but [name] saw her yesterday. Do they have the paperwork. Just get it to me if I need to complete

## 2015-12-10 NOTE — Telephone Encounter (Signed)
Pt returned call.   Placed pt on hold to review chart to update, pt hung up while on hold.

## 2015-12-10 NOTE — Telephone Encounter (Signed)
-----   Message from Brunetta Jeans, PA-C sent at 12/09/2015  8:53 PM EDT ----- Labs look good overall. There were RBC noted in the urine. This could be from exerting herself at the gym. Can also be coming from the vagina. Has she started her menstrual period? Recommend she return to lab in 1 week for repeat UA with micro to make sure this is resolved.
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
## 2015-12-10 NOTE — Telephone Encounter (Signed)
I do not have paperwork for this patient. What I saw her for yesterday was regarding a different complaint. Dr. Lorelei Pont originally saw her and is the one who is completing FMLA paperwork. When I saw her yesterday it was for a syncopal event over the weekend secondary to dehydration. Workup was unremarkable.    I wrote her a note stating she would be restricted to 4 hour work days so she could rest her brain due to concussive syndrome that she was still dealing with since seeing Dr. Lorelei Pont.
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
## 2015-12-10 NOTE — Telephone Encounter (Signed)
Spoke to pt about FMLA paperwork. Pt states that her job requires FMLA paperwork to state that pt is restricted from driving except to doctor's appointments and physical therapy. Pt states that her job needs the [employer] paperwork sent in as soon as possible. Informed pt that I will look into the status of her FMLA paperwork.

## 2015-12-10 NOTE — Telephone Encounter (Signed)
Patient called asking that when completing the FMLA forms that no specifics be put on the form. Ask that they only put restricted driving on the form but do not give details.
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
## 2015-12-10 NOTE — Telephone Encounter (Signed)
Would have to discuss the FMLA with Dr. Lorelei Pont. I gave her a work note regarding restrictions over the next couple of weeks. That should suffice for work restrictions -- she needs to give to HR.

## 2015-12-11 ENCOUNTER — Other Ambulatory Visit: Payer: Self-pay | Admitting: Emergency Medicine

## 2015-12-11 NOTE — Telephone Encounter (Addendum)
Relation to [id number] Call back [phone] Pharmacy:  Reason for call:  Patient states as per employee FMLA has to specify driving restrictions such as "patient is able to drive to doctor appointments, patient is able to drive to work for 1/2 days, patient is able to drive only if necessary" patient states she will not be able to return to work tomorrow 12/12/15 if FMLA is not addended with the above mentioned. Please advise

## 2015-12-11 NOTE — Telephone Encounter (Signed)
Called pt back, she is checking on the status of rewording her FMLA paperwork. Pt states that she's going to work tomorrow and just needs to make sure her job receives the form no later than tomorrow morning. Informed pt that I would inform the provider pt verbalizes understanding .

## 2015-12-11 NOTE — Telephone Encounter (Signed)
Patient calling back checking on the status of message below.

## 2015-12-11 NOTE — Telephone Encounter (Signed)
I had spoken to pt yesterday and completed her FMLA paperwork.  However today discussed with pt and her HR person again- we need to make an addendum to her paperwork for accuracy.   I had thought that she needed driving restrictions, but actually she needed to have 4 hour work days. She is able to concentrate/ drive well after 4 hours at work but is too tired about 8 hours at work

## 2015-12-11 NOTE — Telephone Encounter (Signed)
Take care of

## 2015-12-11 NOTE — Telephone Encounter (Signed)
Faxed completed FMLA form to pt's employer fax # [phone] per form.

## 2015-12-12 ENCOUNTER — Telehealth: Payer: Self-pay | Admitting: Family Medicine

## 2015-12-12 ENCOUNTER — Other Ambulatory Visit: Payer: Self-pay | Admitting: Family Medicine

## 2015-12-12 ENCOUNTER — Ambulatory Visit: Payer: 59

## 2015-12-12 DIAGNOSIS — R109 Unspecified abdominal pain: Secondary | ICD-10-CM

## 2015-12-12 DIAGNOSIS — M542 Cervicalgia: Secondary | ICD-10-CM

## 2015-12-12 DIAGNOSIS — M546 Pain in thoracic spine: Secondary | ICD-10-CM

## 2015-12-12 DIAGNOSIS — R293 Abnormal posture: Secondary | ICD-10-CM
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
## 2015-12-14 ENCOUNTER — Ambulatory Visit (HOSPITAL_BASED_OUTPATIENT_CLINIC_OR_DEPARTMENT_OTHER)
Admission: RE | Admit: 2015-12-14 | Discharge: 2015-12-14 | Disposition: A | Discharge status: Home / Self Care | Payer: 59 | Source: Ambulatory Visit | Attending: Family Medicine | Admitting: Family Medicine

## 2015-12-14 DIAGNOSIS — R109 Unspecified abdominal pain: Secondary | ICD-10-CM | POA: Diagnosis not present

## 2015-12-15 ENCOUNTER — Ambulatory Visit (HOSPITAL_BASED_OUTPATIENT_CLINIC_OR_DEPARTMENT_OTHER): Payer: 59

## 2015-12-16 ENCOUNTER — Ambulatory Visit: Payer: 59 | Admitting: Family Medicine

## 2015-12-16 ENCOUNTER — Telehealth: Payer: Self-pay | Admitting: Internal Medicine

## 2015-12-16 ENCOUNTER — Ambulatory Visit: Payer: 59

## 2015-12-16 DIAGNOSIS — R293 Abnormal posture: Secondary | ICD-10-CM

## 2015-12-16 DIAGNOSIS — M546 Pain in thoracic spine: Secondary | ICD-10-CM

## 2015-12-16 DIAGNOSIS — M542 Cervicalgia: Secondary | ICD-10-CM

## 2015-12-16 NOTE — Telephone Encounter (Signed)
We are fully booked. Suggest she see her PCP or ENT.

## 2015-12-16 NOTE — Therapy (Signed)
[facility] [address]  [place], [place], [address] Phone: [phone]   Fax:  [phone]  Physical Therapy Treatment  Patient Details  Name: [name] MRN: [id number] Date of Birth: 08-09-1965 Referring Provider: Penni Homans, MD  Encounter Date: 12/16/2015      PT End of Session - 12/16/15 1721    Visit Number 6   Number of Visits 12   Date for PT Re-Evaluation 01/03/16   PT Start Time [id number]   PT Stop Time 1804   PT Time Calculation (min) 60 min   Activity Tolerance Patient tolerated treatment well   Behavior During Therapy [facility] for tasks assessed/performed      Past Medical History:  Diagnosis Date  . Allergic rhinitis   . Anemia   . Anxiety   . Asthma   . Contact dermatitis and eczema   . Diverticulitis    CT Scan  . Esophageal reflux   . Hyperlipidemia, mixed   . IBS (irritable bowel syndrome)   . IC (interstitial cystitis)   . Internal hemorrhoids   . Lumbago   . Mitral valve disorders   . Osteoarthritis 07/08/2015    Past Surgical History:  Procedure Laterality Date  . BARTHOLIN CYST MARSUPIALIZATION N/A 12/18/2013   Procedure: BARTHOLIN CYST MARSUPIALIZATION WITH BIOPSY;  Surgeon: Lovenia Kim, MD;  Location: [facility];  Service: Gynecology;  Laterality: N/A;  . CESAREAN SECTION    . DILITATION & CURRETTAGE/HYSTROSCOPY WITH NOVASURE ABLATION N/A 10/14/2012   Procedure: DILATATION & CURETTAGE/HYSTEROSCOPY WITH NOVASURE ABLATION;  Surgeon: Lovenia Kim, MD;  Location: [facility];  Service: Gynecology;  Laterality: N/A;  . MOUTH SURGERY    . SINUS SURGERY WITH INSTATRAK      There were no vitals filed for this visit.      Subjective Assessment - 12/16/15 1717    Subjective Pt. reports she is feeling better today and isn't dizzy or light-headed today.     Patient Stated Goals "To be able to work w/o pain and to get back to normal workout at the gym"   Currently in Pain? Yes    Pain Score 7    Pain Location Neck   Pain Orientation Right   Pain Descriptors / Indicators Tightness   Pain Type Acute pain   Pain Onset More than a month ago   Pain Frequency Intermittent   Multiple Pain Sites No       Today's treatment:  Therex: UBE: 2.0 level, 3 min forward / 3 min back  HEP Review:  Chin tuck 3" x 1 reps  B UT stretch x 30 sec each side  B lev. Scap. Stretch x 30 sec each side  Shoulder retraction with green TB x 15 reps Doorway stretch x 30 sec  Therex: Shoulder retraction / extension with red TB x 15 reps  Manual: B UT STM L sided medial scap. Border TPR Suboccipital release  Modalities: IFC (80-150 Hz) to B cervical paraspinals and UT's in supine  - intensity to pt tolerance x10'          PT Short Term Goals - 12/16/15 1723      PT SHORT TERM GOAL #1   Title Independent with initial HEP by 12/06/15   Status Achieved           PT Long Term Goals - 11/21/15 1830      PT LONG TERM GOAL #1   Title Independent with advanced HEP/gym program  as indicated by 01/03/16   Status On-going     PT LONG TERM GOAL #2   Title Pt will routinely demonstrate neutral spine and shoulder posture w/o cues by 01/03/16   Status On-going     PT LONG TERM GOAL #3   Title Pt will report ability to sit at work desk for >/= without limitation due to neck/upperthoracic pain by 01/03/16   Status On-going     PT LONG TERM GOAL #4   Title Pt will return to normal gym workout with good awareness of proper body mechanics and no increased neck/upper thoracic pain by 01/03/16   Status On-going     PT LONG TERM GOAL #5   Status On-going               Plan - 12/16/15 1721    Clinical Impression Statement Pt. reports she is feeling better today and isn't dizzy or light-headed.  Pt. with initial 7/10 neck pain which resolved to being pain free following therex and E-stim to cervical spine.  Pt. demo'd independence with HEP today and reports she is performing  at home frequently.  Today's treatment continued focus on scapular strengthening and anterior chest stretching.  Pt. tolerated all well and seems to be progressing.  Pt. to see MD on 8/23.     PT Treatment/Interventions Patient/family education;Therapeutic exercise;Manual techniques;Passive range of motion;Taping;Dry needling;Electrical Stimulation;Moist Heat;Ultrasound;Cryotherapy;Traction;Iontophoresis 4mg /ml Dexamethasone;Neuromuscular re-education;ADLs/Self Care Home Management   PT Next Visit Plan F/u regarding insurance coverage for TENS unit - if not covered, provide info for TENS 7000 2nd edition; Postural training; Scapular strengthening/stabilization; Manual therapy & Modalites PRN for pain      Patient will benefit from skilled therapeutic intervention in order to improve the following deficits and impairments:  Pain, Postural dysfunction, Increased muscle spasms, Impaired flexibility, Decreased range of motion, Decreased strength  Visit Diagnosis: Cervicalgia  Pain in thoracic spine  Abnormal posture     Problem List Patient Active Problem List   Diagnosis Date Noted  . Osteoarthritis 07/08/2015  . Acute upper respiratory infection 05/30/2015  . Hematuria 01/20/2015  . Need for prophylactic vaccination and inoculation against influenza 01/20/2015  . Hyperlipidemia, mild 01/20/2015  . Head congestion 01/20/2015  . Fatigue 10/24/2014  . Chest pressure 10/24/2014  . Sinusitis, acute maxillary 10/11/2014  . Preventative health care 06/03/2014  . TMJ arthralgia 08/20/2013  . Diverticulosis 07/01/2011  . Perimenopausal 03/25/2011  . Fibroids 10/24/2010  . FLATULENCE-GAS-BLOATING 05/26/2010  . Anemia 03/25/2010  . IRRITABLE BOWEL SYNDROME 03/25/2010  . Mitral valve disorder 07/09/2007  . Asthma, exogenous 07/09/2007  . ESOPHAGEAL REFLUX 07/09/2007  . DERMATITIS 07/09/2007  . Allergic rhinitis due to pollen 12/14/2006  . LOW BACK PAIN 12/14/2006    Bess Harvest,  PTA 12/16/2015, 6:14 PM  [facility] [address]  [place], [place], [address] Phone: [phone]   Fax:  [phone]  Name: Stayce Koca MRN: [id number] Date of Birth: [birth year]

## 2015-12-16 NOTE — Telephone Encounter (Signed)
Called spoke with pt and is aware of recs. RX's sent in. Nothing further needed 
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
## 2015-12-17 ENCOUNTER — Telehealth: Payer: Self-pay | Admitting: Family Medicine
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
## 2015-12-17 NOTE — Telephone Encounter (Signed)
°  Relation to [id number] Call back number:[phone]   Reason for call:  Patient inquiring about US Abdomen Complete results

## 2015-12-18 ENCOUNTER — Encounter: Payer: Self-pay | Admitting: Family Medicine

## 2015-12-18 ENCOUNTER — Ambulatory Visit (INDEPENDENT_AMBULATORY_CARE_PROVIDER_SITE_OTHER): Payer: 59 | Admitting: Family Medicine

## 2015-12-18 VITALS — BP 127/85 | HR 87 | Temp 98.2°F | Ht 59.0 in | Wt 97.4 lb

## 2015-12-18 DIAGNOSIS — R8290 Unspecified abnormal findings in urine: Secondary | ICD-10-CM

## 2015-12-18 DIAGNOSIS — F0781 Postconcussional syndrome: Secondary | ICD-10-CM

## 2015-12-18 DIAGNOSIS — R0981 Nasal congestion: Secondary | ICD-10-CM

## 2015-12-18 LAB — POCT URINALYSIS DIP (MANUAL ENTRY)
Bilirubin, UA: NEGATIVE
Glucose, UA: NEGATIVE
Ketones, POC UA: NEGATIVE
LEUKOCYTES UA: NEGATIVE
NITRITE UA: NEGATIVE
PH UA: 6
PROTEIN UA: NEGATIVE
Spec Grav, UA: 1.015
Urobilinogen, UA: NEGATIVE

## 2015-12-18 MED ORDER — MONTELUKAST SODIUM 10 MG PO TABS: 10.0000 mg | ORAL_TABLET | Freq: Every day | ORAL | 3 refills | Status: DC

## 2015-12-18 NOTE — Telephone Encounter (Signed)
Pt seeing Dr. Lorelei Pont today, 12/18/2015 at 1300.

## 2015-12-18 NOTE — Progress Notes (Signed)
Pre visit review using our clinic review tool, if applicable. No additional management support is needed unless otherwise documented below in the visit note. 

## 2015-12-18 NOTE — Patient Instructions (Addendum)
We will get you referred to both neurology and ENT  Try adding singulair to your daily medications; this may help with your nasal congestion and cough Try to get plenty of rest on your vacation- daily naps are a must!  When you get back, try continuing to work 4 hours a day. If this is not working out for you let me know.    Please come and see me in about one month if you have not seen neurology by that time  I am also going to have you see urology as you had a little bit of blood in your urine at your last visit and again today
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
## 2015-12-18 NOTE — Telephone Encounter (Signed)
We would need an appt to get documentation to get insurance to cover this exam

## 2015-12-19 ENCOUNTER — Telehealth: Payer: Self-pay | Admitting: Family Medicine

## 2015-12-19 ENCOUNTER — Ambulatory Visit: Payer: 59 | Admitting: Family Medicine

## 2015-12-19 ENCOUNTER — Ambulatory Visit: Payer: 59

## 2015-12-19 DIAGNOSIS — R300 Dysuria: Secondary | ICD-10-CM

## 2015-12-19 NOTE — Telephone Encounter (Signed)
°  Relation to [id number] Call back number:[phone] Pharmacy:  Reason for call: pt saw dr. Lorelei Pont on yesterday and wants to know if she checked for a UTI when she did her urine, pt seems to think she may have a UTI, and would like to come back and get checked for it. Or if it was detected to make her aware. Pt would also like a copy of her results from yesterday

## 2015-12-19 NOTE — Telephone Encounter (Signed)
Pt called back. Discussed provider recommendations. Pt states that she did not request urine results but needed recent lab results. Pt will come in on 12/20/15 to pick up copy of recent lab results and at that time will leave urine sample that will be sent for a culture. Order placed for urine culture.

## 2015-12-19 NOTE — Telephone Encounter (Signed)
Tried to contact pt to inform her that a urine was done yesterday and per Dr.Copland the urine results did not suggest a UTI but the pt can come by and leave another urine sample and we will send it off for a culture. Dr.Copland has also sent a referral for the pt to be seen by a urologist. Pt requested copy of results from urine test. Copy of results left in the front for pt to pick up.

## 2015-12-23 ENCOUNTER — Ambulatory Visit: Payer: 59

## 2015-12-23 ENCOUNTER — Telehealth: Payer: Self-pay

## 2015-12-23 NOTE — Telephone Encounter (Signed)
Received Letter of Medical Necessity form from [place] at [facility] for TENS unit.  Forms numbered and placed in Dr. Frederik Pear red folder for review and signature.

## 2015-12-24 NOTE — Telephone Encounter (Signed)
Forms signed and faxed.  Fax confirmation received.

## 2015-12-26 ENCOUNTER — Ambulatory Visit: Payer: 59 | Admitting: Physical Therapy

## 2016-01-01 ENCOUNTER — Ambulatory Visit: Payer: 59

## 2016-01-02 ENCOUNTER — Ambulatory Visit: Payer: 59 | Attending: Family Medicine | Admitting: Physical Therapy

## 2016-01-02 ENCOUNTER — Ambulatory Visit: Payer: 59 | Admitting: Family Medicine

## 2016-01-02 ENCOUNTER — Other Ambulatory Visit: Payer: 59

## 2016-01-02 DIAGNOSIS — Z0000 Encounter for general adult medical examination without abnormal findings: Secondary | ICD-10-CM

## 2016-01-02 DIAGNOSIS — M546 Pain in thoracic spine: Secondary | ICD-10-CM | POA: Diagnosis present

## 2016-01-02 DIAGNOSIS — R350 Frequency of micturition: Secondary | ICD-10-CM

## 2016-01-02 DIAGNOSIS — R293 Abnormal posture: Secondary | ICD-10-CM | POA: Diagnosis present

## 2016-01-02 DIAGNOSIS — M542 Cervicalgia: Secondary | ICD-10-CM | POA: Diagnosis present

## 2016-01-02 DIAGNOSIS — I059 Rheumatic mitral valve disease, unspecified: Secondary | ICD-10-CM

## 2016-01-02 DIAGNOSIS — K219 Gastro-esophageal reflux disease without esophagitis: Secondary | ICD-10-CM

## 2016-01-02 DIAGNOSIS — M1990 Unspecified osteoarthritis, unspecified site: Secondary | ICD-10-CM

## 2016-01-02 DIAGNOSIS — J301 Allergic rhinitis due to pollen: Secondary | ICD-10-CM

## 2016-01-02 DIAGNOSIS — R300 Dysuria: Secondary | ICD-10-CM

## 2016-01-02 NOTE — Therapy (Signed)
[facility] [address]  [place], [place], [address] Phone: [phone]   Fax:  [phone]  Physical Therapy Treatment  Patient Details  Name: [name] MRN: [id number] Date of Birth: April 06, 1966 Referring Provider: Penni Homans, MD  Encounter Date: 01/02/2016      PT End of Session - 01/02/16 1448    Visit Number 7   Number of Visits 15   Date for PT Re-Evaluation 01/31/16   PT Start Time 1448   PT Stop Time 1550   PT Time Calculation (min) 62 min   Activity Tolerance Patient tolerated treatment well   Behavior During Therapy [facility] for tasks assessed/performed      Past Medical History:  Diagnosis Date  . Allergic rhinitis   . Anemia   . Anxiety   . Asthma   . Contact dermatitis and eczema   . Diverticulitis    CT Scan  . Esophageal reflux   . Hyperlipidemia, mixed   . IBS (irritable bowel syndrome)   . IC (interstitial cystitis)   . Internal hemorrhoids   . Lumbago   . Mitral valve disorders   . Osteoarthritis 07/08/2015    Past Surgical History:  Procedure Laterality Date  . BARTHOLIN CYST MARSUPIALIZATION N/A 12/18/2013   Procedure: BARTHOLIN CYST MARSUPIALIZATION WITH BIOPSY;  Surgeon: Lovenia Kim, MD;  Location: [facility];  Service: Gynecology;  Laterality: N/A;  . CESAREAN SECTION    . DILITATION & CURRETTAGE/HYSTROSCOPY WITH NOVASURE ABLATION N/A 10/14/2012   Procedure: DILATATION & CURETTAGE/HYSTEROSCOPY WITH NOVASURE ABLATION;  Surgeon: Lovenia Kim, MD;  Location: [facility];  Service: Gynecology;  Laterality: N/A;  . MOUTH SURGERY    . SINUS SURGERY WITH INSTATRAK      There were no vitals filed for this visit.      Subjective Assessment - 01/02/16 1448    Subjective Pt reports neck pain been less but still notes stiffness esp when driving. Reports she has received home TENS unit but has not started using it yet.   How long can you sit comfortably? up to 4 hrs   Patient  Stated Goals "To be able to work w/o pain and to get back to normal workout at the gym"   Currently in Pain? Yes   Pain Score --  6-7/10   Pain Location Neck   Pain Orientation Right;Left  R > L   Pain Descriptors / Indicators Tightness;Discomfort;Heaviness   Pain Radiating Towards along thoracic paraspinals   Pain Frequency Intermittent   Aggravating Factors  driving, sitting at desk   Pain Relieving Factors TPR with ball, heat, scapular retraction exercises   Effect of Pain on Daily Activities limited tolerance for sitting at work, uses caution when lifting, limited tolerance for driving            [facility] PT Assessment - 01/02/16 1448      Assessment   Medical Diagnosis Neck/upper back pain    Referring Provider Penni Homans, MD   Onset Date/Surgical Date 10/16/15   Hand Dominance Right   Next MD Visit TBD     Prior Function   Level of Independence Independent   Vocation Full time employment   Engineer, 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[place])   Leisure Work out, walking, gym     AROM   Cervical Flexion 46  tension   Cervical Extension 58  tension, no pain   Cervical - Right Side Bend 44  Cervical - Left Side Bend 45   Cervical - Right Rotation 65   Cervical - Left Rotation 64     Strength   Right Shoulder Flexion 5/5   Right Shoulder ABduction 5/5   Right Shoulder Internal Rotation 4+/5   Right Shoulder External Rotation 4+/5   Left Shoulder Flexion 5/5   Left Shoulder ABduction 5/5   Left Shoulder Internal Rotation 4+/5   Left Shoulder External Rotation 4/5            Today'sTreatment  Recert ROM/MMT Goal Assessment  TherEx UBE - lvl 2.0 fwd/back x 2' each Hooklying on 1/2 FR:    B Shoulder Horiz ABD with green TB x15    B Shoulder ER with red TB x10  Manual Suboccipital release B UT & LS STM Manual traction 5x30"   Mechanical Traction C-spine in hooklying at 10 dg pull - 12#/6#, 60"/20" x10'           PT Education - 01/02/16 1550    Education provided Yes   Education Details Hooklying B shoulder ER with red TB added to HEP   Person(s) Educated Patient   Methods Explanation;Demonstration   Comprehension Verbalized understanding;Returned demonstration          PT Short Term Goals - 12/16/15 1723      PT SHORT TERM GOAL #1   Title Independent with initial HEP by 12/06/15   Status Achieved           PT Long Term Goals - 01/02/16 1510      PT LONG TERM GOAL #1   Title Independent with advanced HEP/gym program as indicated by 01/31/16   Status On-going     PT LONG TERM GOAL #2   Title Pt will routinely demonstrate neutral spine and shoulder posture w/o cues by 01/31/16   Status Partially Met  Pt reporting increased awareness but still inconsistent     PT LONG TERM GOAL #3   Title Pt will report ability to sit at work desk for >/= 2 hrs without limitation due to neck/upperthoracic pain by 01/31/16   Status Partially Met     PT LONG TERM GOAL #4   Title Pt will return to normal gym workout with good awareness of proper body mechanics and no increased neck/upper thoracic pain by 01/31/16   Status Unable to assess  Pt has not attempted to return to gym due to awaiting clearance from PT and/or MD to return to gym               Plan - 01/02/16 1550    Clinical Impression Statement Pt returning to PT after ~2.5 week absence. Reports decreasing pain but continued increased tension in neck although pain rating remains consistent from eval averaging 7/10 (6-7/10 today). Cervical ROM now [facility] w/o pain other than mildly limited B rotation AROM (PROM WNL), but increased tension still noted with flexion & extension. Manual assessment revealed continued increased tension in cervical and upper thoracic paraspinals along with LS, which responded well to manual traction, therefore intiated trial of mechanical traction. Pt has reached end date of POC but has 5 visits remaining due to  missed visits. Pt continues to have good potential to benefit from PT, therefore will recert for additional 4 weeks at 2x/wk.   Rehab Potential Good   PT Frequency 2x / week   PT Duration 4 weeks   PT Treatment/Interventions Patient/family education;Therapeutic exercise;Manual techniques;Passive range of motion;Taping;Dry needling;Electrical Stimulation;Moist Heat;Ultrasound;Cryotherapy;Traction;Iontophoresis 81m/ml Dexamethasone;Neuromuscular re-education;ADLs/Self Care  Home Management   PT Next Visit Plan Assess reponse to traction; Progress Postural training & Scapular strengthening/stabilization; Manual therapy & Modalites PRN for pain; Continue & progress as tol mechanical traction if benefir noted   Consulted and Agree with Plan of Care Patient      Patient will benefit from skilled therapeutic intervention in order to improve the following deficits and impairments:  Pain, Postural dysfunction, Increased muscle spasms, Impaired flexibility, Decreased range of motion, Decreased strength  Visit Diagnosis: Cervicalgia  Pain in thoracic spine  Abnormal posture     Problem List Patient Active Problem List   Diagnosis Date Noted  . Osteoarthritis 07/08/2015  . Acute upper respiratory infection 05/30/2015  . Hematuria 01/20/2015  . Need for prophylactic vaccination and inoculation against influenza 01/20/2015  . Hyperlipidemia, mild 01/20/2015  . Head congestion 01/20/2015  . Fatigue 10/24/2014  . Chest pressure 10/24/2014  . Sinusitis, acute maxillary 10/11/2014  . Preventative health care 06/03/2014  . TMJ arthralgia 08/20/2013  . Diverticulosis 07/01/2011  . Perimenopausal 03/25/2011  . Fibroids 10/24/2010  . FLATULENCE-GAS-BLOATING 05/26/2010  . Anemia 03/25/2010  . IRRITABLE BOWEL SYNDROME 03/25/2010  . Mitral valve disorder 07/09/2007  . Asthma, exogenous 07/09/2007  . ESOPHAGEAL REFLUX 07/09/2007  . DERMATITIS 07/09/2007  . Allergic rhinitis due to pollen  12/14/2006  . LOW BACK PAIN 12/14/2006    Percival Spanish, PT, MPT 01/02/2016, 5:33 PM  [facility] [address]  [place], [place], [address] Phone: [phone]   Fax:  [phone]  Name: [name] MRN: [id number] Date of Birth: 1965/11/12

## 2016-01-03 ENCOUNTER — Telehealth: Payer: Self-pay | Admitting: Family Medicine

## 2016-01-03 LAB — URINE CULTURE: ORGANISM ID, BACTERIA: NO GROWTH

## 2016-01-03 NOTE — Telephone Encounter (Signed)
Pt says that she need a letter for her FMLA stating that she need accommodations due to her current condition. (concusion). She says that she need a Anti glare screen for her computer and also a chair that will support her lumbar. Pt would like a call back when letter is ready for pick up.   ALSO, pt called in to request her urine results.

## 2016-01-03 NOTE — Telephone Encounter (Signed)
Gave her a call- I will get this letter for her and she will pick it up on Monday.  She is feeling better and hopes to gradually increase her ours at work which is great news. She is seeing neurology and also urology (regarding microhematuria) next week

## 2016-01-06 ENCOUNTER — Ambulatory Visit: Payer: 59

## 2016-01-08 ENCOUNTER — Other Ambulatory Visit: Payer: Self-pay | Admitting: *Deleted

## 2016-01-08 ENCOUNTER — Ambulatory Visit (INDEPENDENT_AMBULATORY_CARE_PROVIDER_SITE_OTHER): Payer: 59 | Admitting: Neurology

## 2016-01-08 ENCOUNTER — Encounter: Payer: Self-pay | Admitting: Neurology

## 2016-01-08 ENCOUNTER — Encounter: Payer: Self-pay | Admitting: *Deleted

## 2016-01-08 ENCOUNTER — Telehealth: Payer: Self-pay | Admitting: Neurology

## 2016-01-08 VITALS — BP 108/60 | HR 73 | Ht 59.0 in | Wt 98.5 lb

## 2016-01-08 DIAGNOSIS — S060X1D Concussion with loss of consciousness of 30 minutes or less, subsequent encounter: Secondary | ICD-10-CM

## 2016-01-08 DIAGNOSIS — S060X9A Concussion with loss of consciousness of unspecified duration, initial encounter: Secondary | ICD-10-CM | POA: Insufficient documentation

## 2016-01-08 DIAGNOSIS — R55 Syncope and collapse: Secondary | ICD-10-CM | POA: Insufficient documentation

## 2016-01-08 DIAGNOSIS — IMO0002 Reserved for concepts with insufficient information to code with codable children: Secondary | ICD-10-CM

## 2016-01-08 MED ORDER — DIVALPROEX SODIUM ER 250 MG PO TB24: 500.0000 mg | ORAL_TABLET | Freq: Every day | ORAL | 11 refills | Status: DC

## 2016-01-08 MED ORDER — ALPRAZOLAM 0.5 MG PO TABS: ORAL_TABLET | ORAL | 0 refills | Status: DC

## 2016-01-08 MED ORDER — ALPRAZOLAM 0.5 MG PO TABS: 0.5000 mg | ORAL_TABLET | Freq: Every evening | ORAL | 0 refills | Status: DC | PRN

## 2016-01-08 NOTE — Telephone Encounter (Signed)
Per Dr. 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  She is able to return to her job duties for 8 hours each day.  A letter has been provided to her requesting accommodations to allow her to work from home.   Patient is agreeable to this plan.  She also asked for her EEG to be moved to an earlier date.  Shawn Stall has worked her into his schedule on 01/09/16 (arrival time 2:45pm for a 3pm test). She is aware of new date and time.
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
## 2016-01-08 NOTE — Patient Instructions (Signed)
No driving until episode free for 6 months 

## 2016-01-09 ENCOUNTER — Ambulatory Visit: Payer: 59

## 2016-01-09 ENCOUNTER — Other Ambulatory Visit: Payer: Self-pay

## 2016-01-09 ENCOUNTER — Telehealth: Payer: Self-pay | Admitting: Neurology

## 2016-01-09 ENCOUNTER — Telehealth: Payer: Self-pay | Admitting: Family Medicine

## 2016-01-09 ENCOUNTER — Encounter: Payer: Self-pay | Admitting: *Deleted

## 2016-01-09 ENCOUNTER — Other Ambulatory Visit: Payer: Self-pay | Admitting: Family Medicine

## 2016-01-09 DIAGNOSIS — M546 Pain in thoracic spine: Secondary | ICD-10-CM

## 2016-01-09 DIAGNOSIS — F0781 Postconcussional syndrome: Secondary | ICD-10-CM

## 2016-01-09 DIAGNOSIS — R293 Abnormal posture: Secondary | ICD-10-CM

## 2016-01-09 DIAGNOSIS — M542 Cervicalgia: Secondary | ICD-10-CM

## 2016-01-09 NOTE — Telephone Encounter (Signed)
I have re referred to [facility]. They will do EEG if appropriate we do not do them here. Cannot get insurance to pay for MRI without visit anymore.

## 2016-01-09 NOTE — Telephone Encounter (Signed)
Called the patient left a detailed message of information and to call to confirm message received/questions.

## 2016-01-09 NOTE — Telephone Encounter (Signed)
Patient informed of instructions. She did have referral changed to a neurologist in [place] at [place].  Also seeing Dr. Lorelei Pont on Friday and will have her order an MRI.

## 2016-01-09 NOTE — Telephone Encounter (Deleted)
Refill appropriate - rx postdated, printed, signed and placed up front for pick up.

## 2016-01-09 NOTE — Telephone Encounter (Signed)
Pt called in to speak with provider. Pt says that she was referred to [facility]. Pt says that she was informed a lot from provider, information that she was never advised of by PCP or provider [facility]. Pt would like to be sent to a different location. She says that the referred location has a grade 1 and also is to far. Pt would now like to go to a location in [place].    Please call pt to assist and advise further.

## 2016-01-09 NOTE — Therapy (Signed)
[facility] [address]  [place], [place], [address] Phone: [phone]   Fax:  [phone]  Physical Therapy Treatment  Patient Details  Name: Breena Bevacqua MRN: [id number] Date of Birth: 05-12-1965 Referring Provider: Penni Homans, MD   Encounter Date: 01/09/2016      PT End of Session - 01/09/16 1416    Visit Number 8   Number of Visits 15   Date for PT Re-Evaluation 01/31/16   PT Start Time 5170  pt. arrived 8 min late   PT Stop Time 1457   PT Time Calculation (min) 47 min   Activity Tolerance Patient tolerated treatment well   Behavior During Therapy [facility] for tasks assessed/performed      Past Medical History:  Diagnosis Date  . Allergic rhinitis   . Anemia   . Anxiety   . Asthma   . Contact dermatitis and eczema   . Diverticulitis    CT Scan  . Esophageal reflux   . Hyperlipidemia, mixed   . IBS (irritable bowel syndrome)   . IC (interstitial cystitis)   . Internal hemorrhoids   . Lumbago   . Mitral valve disorders   . Osteoarthritis 07/08/2015  . Post concussive syndrome     Past Surgical History:  Procedure Laterality Date  . BARTHOLIN CYST MARSUPIALIZATION N/A 12/18/2013   Procedure: BARTHOLIN CYST MARSUPIALIZATION WITH BIOPSY;  Surgeon: Lovenia Kim, MD;  Location: [facility];  Service: Gynecology;  Laterality: N/A;  . CESAREAN SECTION    . DILITATION & CURRETTAGE/HYSTROSCOPY WITH NOVASURE ABLATION N/A 10/14/2012   Procedure: DILATATION & CURETTAGE/HYSTEROSCOPY WITH NOVASURE ABLATION;  Surgeon: Lovenia Kim, MD;  Location: [facility];  Service: Gynecology;  Laterality: N/A;  . MOUTH SURGERY    . SINUS SURGERY WITH INSTATRAK      There were no vitals filed for this visit.      Subjective Assessment - 01/09/16 1410    Subjective Pt. is pain free initially today however still reports R-sided neck, "tension" at this point.  Pt. reports her compliance with the HEP has been limited at  this point, and that she gets to HEP activities, "when she remembers".     Patient Stated Goals "To be able to work w/o pain and to get back to normal workout at the gym"   Currently in Pain? No/denies   Pain Score 0-No pain   Multiple Pain Sites No            [facility] PT Assessment - 01/09/16 1417      Assessment   Referring Provider Penni Homans, MD    Next MD Visit 01/14/16       Today's treatment:   Manual:  Suboccipital STM  Cervical manual traction x 2 min  B UT manual stretch x 30 sec each side  Therex: Laying on half foam bolster:         Chest stretch x 1 min         Scapular retraction / ER with red TB x 15 reps        B shoulder horizontal abduction with green TB x 15 reps Standing scapular retraction with green TB 3" x 15 reps          Mechanical Traction: C-spine in hooklying at 10 dg pull - 14#/8#, 60"/20" x 12'        PT Short Term Goals - 12/16/15 1723      PT SHORT TERM GOAL #1  Title Independent with initial HEP by 12/06/15   Status Achieved           PT Long Term Goals - 01/02/16 1510      PT LONG TERM GOAL #1   Title Independent with advanced HEP/gym program as indicated by 01/31/16   Status On-going     PT LONG TERM GOAL #2   Title Pt will routinely demonstrate neutral spine and shoulder posture w/o cues by 01/31/16   Status Partially Met  Pt reporting increased awareness but still inconsistent     PT LONG TERM GOAL #3   Title Pt will report ability to sit at work desk for >/= 2 hrs without limitation due to neck/upperthoracic pain by 01/31/16   Status Partially Met     PT LONG TERM GOAL #4   Title Pt will return to normal gym workout with good awareness of proper body mechanics and no increased neck/upper thoracic pain by 01/31/16   Status Unable to assess  Pt has not attempted to return to gym due to awaiting clearance from PT and/or MD to return to gym               Plan - 01/09/16 1416    Clinical Impression Statement  Pt. reporting primary MD still thinks she has post-concussion syndrome while neurologist thinks pt. is having seizures.  Pt. reports she is not taking medication that neurologist has prescribed currently.  Pt. denies any unusual symptoms initially today.  Pt. tolerated conservative scapular strengthening activity today well reporting benefit from cervical mechanical traction used last treatment.  Mechanical cervical traction progressed today to 14#/8# with pt. reporting she, "could really feel it stretching" afterward.  Pt. to see MD on 9/19 for f/u.     PT Treatment/Interventions Patient/family education;Therapeutic exercise;Manual techniques;Passive range of motion;Taping;Dry needling;Electrical Stimulation;Moist Heat;Ultrasound;Cryotherapy;Traction;Iontophoresis 15m/ml Dexamethasone;Neuromuscular re-education;ADLs/Self Care Home Management   PT Next Visit Plan Progress Postural training & Scapular strengthening/stabilization; Manual therapy & Modalites PRN for pain; Continue & progress as tol mechanical traction if benefir noted      Patient will benefit from skilled therapeutic intervention in order to improve the following deficits and impairments:  Pain, Postural dysfunction, Increased muscle spasms, Impaired flexibility, Decreased range of motion, Decreased strength  Visit Diagnosis: Cervicalgia  Pain in thoracic spine  Abnormal posture     Problem List Patient Active Problem List   Diagnosis Date Noted  . Concussion with loss of consciousness 01/08/2016  . Passed out 01/08/2016  . Osteoarthritis 07/08/2015  . Acute upper respiratory infection 05/30/2015  . Hematuria 01/20/2015  . Need for prophylactic vaccination and inoculation against influenza 01/20/2015  . Hyperlipidemia, mild 01/20/2015  . Head congestion 01/20/2015  . Fatigue 10/24/2014  . Chest pressure 10/24/2014  . Sinusitis, acute maxillary 10/11/2014  . Preventative health care 06/03/2014  . TMJ arthralgia  08/20/2013  . Diverticulosis 07/01/2011  . Perimenopausal 03/25/2011  . Fibroids 10/24/2010  . FLATULENCE-GAS-BLOATING 05/26/2010  . Anemia 03/25/2010  . IRRITABLE BOWEL SYNDROME 03/25/2010  . Mitral valve disorder 07/09/2007  . Asthma, exogenous 07/09/2007  . ESOPHAGEAL REFLUX 07/09/2007  . DERMATITIS 07/09/2007  . Allergic rhinitis due to pollen 12/14/2006  . LOW BACK PAIN 12/14/2006    MBess Harvest PTA 01/09/2016, 5:12 PM  [facility] [place] [address]Phone: [phone]  Fax:  [phone] Name: RKristol AlmanzarMRN: [id number] of Birth: [birth year]

## 2016-01-09 NOTE — Telephone Encounter (Signed)
Unable to reach patient.  Left message to please call us back with any questions concerning her appointment yesterday.  Also, we are glad to provided records to her new physician (will need med release form signed). As requested, her appointments have been canceled.

## 2016-01-09 NOTE — Telephone Encounter (Signed)
Pt called said she is so confused. She doesn't understand why Dr Darreld Mclean put her on seizure medication if she doesn't know for sure she is having seizures. She said she can not come for the EEG today bc Dr Darreld Mclean told her she could not drive for 6 mths. She sts she can not afford to pay someone to drive her here from [place]. I did advise her the EEG was a test to help r/o seizure activity. She requested all appt's to be c/a. She said she is going to call PCP to get referred to provider in [place]. She does want to know why DR Y prescribed seizure medication.  RN pls call

## 2016-01-09 NOTE — Telephone Encounter (Signed)
Pt called back to follow up on request for new referral. States she will not go back to the Neurologist that she was referred to. Patient also would like to have an order entered for and EEG and an MRI so that she can have it done [place]. Patient saw Dr Lorelei Pont

## 2016-01-13 ENCOUNTER — Ambulatory Visit: Payer: 59 | Admitting: Physical Therapy

## 2016-01-13 DIAGNOSIS — R293 Abnormal posture: Secondary | ICD-10-CM

## 2016-01-13 DIAGNOSIS — M546 Pain in thoracic spine: Secondary | ICD-10-CM

## 2016-01-13 DIAGNOSIS — M542 Cervicalgia: Secondary | ICD-10-CM | POA: Diagnosis not present

## 2016-01-13 NOTE — Therapy (Signed)
[facility] [address]  [place], [place], [address] Phone: [phone]   Fax:  [phone]  Physical Therapy Treatment  Patient Details  Name: [name] MRN: [id number] Date of Birth: [birth year] Referring Provider: Penni Homans, MD   Encounter Date: 01/13/2016      PT End of Session - 01/13/16 1408    Visit Number 9   Number of Visits 15   Date for PT Re-Evaluation 01/31/16   PT Start Time 59  Pt arrived late   PT Stop Time 1456   PT Time Calculation (min) 48 min   Activity Tolerance Patient tolerated treatment well   Behavior During Therapy [facility] for tasks assessed/performed      Past Medical History:  Diagnosis Date  . Allergic rhinitis   . Anemia   . Anxiety   . Asthma   . Contact dermatitis and eczema   . Diverticulitis    CT Scan  . Esophageal reflux   . Hyperlipidemia, mixed   . IBS (irritable bowel syndrome)   . IC (interstitial cystitis)   . Internal hemorrhoids   . Lumbago   . Mitral valve disorders   . Osteoarthritis 07/08/2015  . Post concussive syndrome     Past Surgical History:  Procedure Laterality Date  . BARTHOLIN CYST MARSUPIALIZATION N/A 12/18/2013   Procedure: BARTHOLIN CYST MARSUPIALIZATION WITH BIOPSY;  Surgeon: Lovenia Kim, MD;  Location: [facility];  Service: Gynecology;  Laterality: N/A;  . CESAREAN SECTION    . DILITATION & CURRETTAGE/HYSTROSCOPY WITH NOVASURE ABLATION N/A 10/14/2012   Procedure: DILATATION & CURETTAGE/HYSTEROSCOPY WITH NOVASURE ABLATION;  Surgeon: Lovenia Kim, MD;  Location: [facility];  Service: Gynecology;  Laterality: N/A;  . MOUTH SURGERY    . SINUS SURGERY WITH INSTATRAK      There were no vitals filed for this visit.      Subjective Assessment - 01/13/16 1408    Subjective Pt reports she took a Tylenol gel tab on Friday and since has had no pain.    Patient Stated Goals "To be able to work w/o pain and to get back to normal workout  at the gym"   Currently in Pain? No/denies           Today'sTreatment  TherEx UBE - lvl 2.5 fwd/back x 2' each Hooklying on 1/2 FR:    Chest pec stretch x2'    B Shoulder Horiz ABD with green TB x15    B Alt Shoulder Flexion + Opp Shoulder Extension with green TB x10    B Shoulder ER with red TB x15    B Shoulder Protraction/ Serratus punch 2# x10 Prone over orange (55 cm) Pball - Scapular retraction +    I x10    T x10    W x10    Y x10  Mechanical Traction C-spine in hooklying at 10 dg pull - 14#/8#, 60"/20" x10'           PT Education - 01/13/16 1444    Education provided Yes   Education Details Prone scapular retraction over 55 cm Pball added to HEP   Person(s) Educated Patient   Methods Explanation;Demonstration;Handout   Comprehension Verbalized understanding;Returned demonstration          PT Short Term Goals - 12/16/15 1723      PT SHORT TERM GOAL #1   Title Independent with initial HEP by 12/06/15   Status Achieved  PT Long Term Goals - 01/02/16 1510      PT LONG TERM GOAL #1   Title Independent with advanced HEP/gym program as indicated by 01/31/16   Status On-going     PT LONG TERM GOAL #2   Title Pt will routinely demonstrate neutral spine and shoulder posture w/o cues by 01/31/16   Status Partially Met  Pt reporting increased awareness but still inconsistent     PT LONG TERM GOAL #3   Title Pt will report ability to sit at work desk for >/= 2 hrs without limitation due to neck/upperthoracic pain by 01/31/16   Status Partially Met     PT LONG TERM GOAL #4   Title Pt will return to normal gym workout with good awareness of proper body mechanics and no increased neck/upper thoracic pain by 01/31/16   Status Unable to assess  Pt has not attempted to return to gym due to awaiting clearance from PT and/or MD to return to gym               Plan - 01/13/16 1445    Clinical Impression Statement Pt reports she has had no  pain since taking Tylenol gel caps on Friday. Able to tolerate progression of scapular stregthening with prone I's, Ts, W's & Y's over Pball added to HEP.   PT Treatment/Interventions Patient/family education;Therapeutic exercise;Manual techniques;Passive range of motion;Taping;Dry needling;Electrical Stimulation;Moist Heat;Ultrasound;Cryotherapy;Traction;Iontophoresis 40m/ml Dexamethasone;Neuromuscular re-education;ADLs/Self Care Home Management   PT Next Visit Plan Progress Postural training & Scapular strengthening/stabilization; Manual therapy & Modalites PRN for pain; Continue mechanical traction & progress as tol if benefit noted   Consulted and Agree with Plan of Care Patient      Patient will benefit from skilled therapeutic intervention in order to improve the following deficits and impairments:  Pain, Postural dysfunction, Increased muscle spasms, Impaired flexibility, Decreased range of motion, Decreased strength  Visit Diagnosis: Cervicalgia  Pain in thoracic spine  Abnormal posture     Problem List Patient Active Problem List   Diagnosis Date Noted  . Concussion with loss of consciousness 01/08/2016  . Passed out 01/08/2016  . Osteoarthritis 07/08/2015  . Acute upper respiratory infection 05/30/2015  . Hematuria 01/20/2015  . Need for prophylactic vaccination and inoculation against influenza 01/20/2015  . Hyperlipidemia, mild 01/20/2015  . Head congestion 01/20/2015  . Fatigue 10/24/2014  . Chest pressure 10/24/2014  . Sinusitis, acute maxillary 10/11/2014  . Preventative health care 06/03/2014  . TMJ arthralgia 08/20/2013  . Diverticulosis 07/01/2011  . Perimenopausal 03/25/2011  . Fibroids 10/24/2010  . FLATULENCE-GAS-BLOATING 05/26/2010  . Anemia 03/25/2010  . IRRITABLE BOWEL SYNDROME 03/25/2010  . Mitral valve disorder 07/09/2007  . Asthma, exogenous 07/09/2007  . ESOPHAGEAL REFLUX 07/09/2007  . DERMATITIS 07/09/2007  . Allergic rhinitis due to pollen  12/14/2006  . LOW BACK PAIN 12/14/2006    JPercival Spanish PT, MPT 01/13/2016, 3:19 PM  [facility] [place] [address]Phone: [phone]  Fax:  [phone] Name: RKatelan HirtMRN: [id number] of Birth: [birth year]

## 2016-01-14 ENCOUNTER — Ambulatory Visit: Payer: 59 | Admitting: Physical Therapy

## 2016-01-14 DIAGNOSIS — M542 Cervicalgia: Secondary | ICD-10-CM | POA: Diagnosis not present

## 2016-01-14 DIAGNOSIS — M546 Pain in thoracic spine: Secondary | ICD-10-CM

## 2016-01-14 DIAGNOSIS — R293 Abnormal posture: Secondary | ICD-10-CM

## 2016-01-14 NOTE — Therapy (Signed)
[facility] [address]  [place], [place], [address] Phone: [phone]   Fax:  [phone]  Physical Therapy Treatment  Patient Details  Name: [name] MRN: [id number] Date of Birth: [birth year] Referring Provider: Penni Homans, MD   Encounter Date: 01/14/2016      PT End of Session - 01/14/16 1405    Visit Number 10   Number of Visits 15   Date for PT Re-Evaluation 01/31/16   PT Start Time 8527   PT Stop Time 1501   PT Time Calculation (min) 56 min   Activity Tolerance Patient tolerated treatment well   Behavior During Therapy [facility] for tasks assessed/performed      Past Medical History:  Diagnosis Date  . Allergic rhinitis   . Anemia   . Anxiety   . Asthma   . Contact dermatitis and eczema   . Diverticulitis    CT Scan  . Esophageal reflux   . Hyperlipidemia, mixed   . IBS (irritable bowel syndrome)   . IC (interstitial cystitis)   . Internal hemorrhoids   . Lumbago   . Mitral valve disorders   . Osteoarthritis 07/08/2015  . Post concussive syndrome     Past Surgical History:  Procedure Laterality Date  . BARTHOLIN CYST MARSUPIALIZATION N/A 12/18/2013   Procedure: BARTHOLIN CYST MARSUPIALIZATION WITH BIOPSY;  Surgeon: Lovenia Kim, MD;  Location: [facility];  Service: Gynecology;  Laterality: N/A;  . CESAREAN SECTION    . DILITATION & CURRETTAGE/HYSTROSCOPY WITH NOVASURE ABLATION N/A 10/14/2012   Procedure: DILATATION & CURETTAGE/HYSTEROSCOPY WITH NOVASURE ABLATION;  Surgeon: Lovenia Kim, MD;  Location: [facility];  Service: Gynecology;  Laterality: N/A;  . MOUTH SURGERY    . SINUS SURGERY WITH INSTATRAK      There were no vitals filed for this visit.      Subjective Assessment - 01/14/16 1405    Subjective Pt reports she was able to obtain a 55 cm Pball for home use. Notes some increased tension after yesterday's PT session but no increased pain.   How long can you sit comfortably?  4 hrs   Patient Stated Goals "To be able to work w/o pain and to get back to normal workout at the gym"   Currently in Pain? No/denies            [facility] PT Assessment - 01/14/16 1405      Assessment   Medical Diagnosis Neck/upper back pain    Onset Date/Surgical Date 10/16/15   Hand Dominance Right   Next MD Visit 01/22/16     AROM   Cervical Flexion 49   Cervical Extension 60   Cervical - Right Side Bend 46   Cervical - Left Side Bend 48   Cervical - Right Rotation 69   Cervical - Left Rotation 68     Strength   Right Shoulder Flexion 5/5   Right Shoulder ABduction 5/5   Right Shoulder Internal Rotation 4+/5   Right Shoulder External Rotation 4+/5   Left Shoulder Flexion 5/5   Left Shoulder ABduction 5/5   Left Shoulder Internal Rotation 4+/5   Left Shoulder External Rotation 4+/5           Today'sTreatment  TherEx UBE - lvl 2.5 fwd/back x 2' each  ROM/MMT   Prone over orange (55 cm) Pball - Scapular retraction +    I  1# x10    T 1# x10    W  1# x10    Y x10 Wall push-up on orange (55 cm) Pball x20 BATCA Pull-down 10# x10  Modalities IFC (80-150 Hz) to B neck/upper thoracic paraspinals - intensity to pt tolerance x12'  Mechanical Traction C-spine in hooklying at 10 dg pull - 14#/8#, 60"/20" x12'           PT Education - 01/13/16 1444    Education provided Yes   Education Details Prone scapular retraction over 55 cm Pball added to HEP   Person(s) Educated Patient   Methods Explanation;Demonstration;Handout   Comprehension Verbalized understanding;Returned demonstration          PT Short Term Goals - 12/16/15 1723      PT SHORT TERM GOAL #1   Title Independent with initial HEP by 12/06/15   Status Achieved           PT Long Term Goals - 01/14/16 1423      PT LONG TERM GOAL #1   Title Independent with advanced HEP/gym program as indicated by 01/31/16   Status Partially Met     PT LONG TERM GOAL #2   Title Pt will routinely  demonstrate neutral spine and shoulder posture w/o cues by 01/31/16   Status Partially Met  Pt reporting increased awareness but still inconsistent     PT LONG TERM GOAL #3   Title Pt will report ability to sit at work desk for >/= 2 hrs without limitation due to neck/upper thoracic pain by 01/31/16   Status Achieved  Able to work 4 hr/day w/o pain      PT LONG TERM GOAL #4   Title Pt will return to normal gym workout with good awareness of proper body mechanics and no increased neck/upper thoracic pain by 01/31/16   Status Partially Met  Pt has started back to gym using treadmill and using free weights w/o pain but has not attempted any classes yet.               Plan - 01/14/16 1428    Clinical Impression Statement Pt has demonstrated good progress with PT to date. Pt now typically w/o pain but continues to note some increased tension. Cervical ROM now [facility] and B shoulder strength 4+/5 to 5/5. Pt demonstrating improving postural awareness and increased tolerance for sitting at work desk for up to 4 hrs w/o increased pain. All goals at least partially met at this time and pt appears to be on track to meet all goals by end of current POC.   PT Treatment/Interventions Patient/family education;Therapeutic exercise;Manual techniques;Passive range of motion;Taping;Dry needling;Electrical Stimulation;Moist Heat;Ultrasound;Cryotherapy;Traction;Iontophoresis 89m/ml Dexamethasone;Neuromuscular re-education;ADLs/Self Care Home Management   PT Next Visit Plan Progress Postural training & Scapular strengthening/stabilization; Manual therapy & Modalites PRN for pain; Continue mechanical traction & progress as tol if benefit noted   Consulted and Agree with Plan of Care Patient      Patient will benefit from skilled therapeutic intervention in order to improve the following deficits and impairments:  Pain, Postural dysfunction, Increased muscle spasms, Impaired flexibility, Decreased range of motion,  Decreased strength  Visit Diagnosis: Cervicalgia  Pain in thoracic spine  Abnormal posture     Problem List Patient Active Problem List   Diagnosis Date Noted  . Concussion with loss of consciousness 01/08/2016  . Passed out 01/08/2016  . Osteoarthritis 07/08/2015  . Acute upper respiratory infection 05/30/2015  . Hematuria 01/20/2015  . Need for prophylactic vaccination and inoculation against influenza 01/20/2015  . Hyperlipidemia, mild 01/20/2015  .  Head congestion 01/20/2015  . Fatigue 10/24/2014  . Chest pressure 10/24/2014  . Sinusitis, acute maxillary 10/11/2014  . Preventative health care 06/03/2014  . TMJ arthralgia 08/20/2013  . Diverticulosis 07/01/2011  . Perimenopausal 03/25/2011  . Fibroids 10/24/2010  . FLATULENCE-GAS-BLOATING 05/26/2010  . Anemia 03/25/2010  . IRRITABLE BOWEL SYNDROME 03/25/2010  . Mitral valve disorder 07/09/2007  . Asthma, exogenous 07/09/2007  . ESOPHAGEAL REFLUX 07/09/2007  . DERMATITIS 07/09/2007  . Allergic rhinitis due to pollen 12/14/2006  . LOW BACK PAIN 12/14/2006    Percival Spanish, PT, MPT 01/14/2016, 7:23 PM  [facility] [address]  [place], [place], [address] Phone: [phone]   Fax:  [phone]  Name: [name] MRN: [id number] Date of Birth: 05/23/1965

## 2016-01-15 ENCOUNTER — Ambulatory Visit: Payer: 59 | Admitting: Family Medicine

## 2016-01-15 ENCOUNTER — Telehealth: Payer: Self-pay | Admitting: Family Medicine

## 2016-01-15 NOTE — Telephone Encounter (Addendum)
Relation to IP:PNDL Call back number:[phone]   Reason for call:   Patient requesting an updated letter for work stating she can drive from work to home and to doctors appointments. In addition patient is requesting a letter approving an anti screen glare for her computer at work addressed to whom it may concern.   Patient stated she wanted to be referred to a neurologist on "[facility]" in [facility], please advise patient requesting to speak with referral coordinator. Please advise

## 2016-01-15 NOTE — Telephone Encounter (Signed)
Patient scheduled to see Dr Justine Null on 01/28/16 @8 :30am, left detail message on vm, ok per [facility]

## 2016-01-17 ENCOUNTER — Encounter: Payer: Self-pay | Admitting: Family Medicine

## 2016-01-17 ENCOUNTER — Telehealth: Payer: Self-pay | Admitting: Family Medicine

## 2016-01-17 NOTE — Telephone Encounter (Signed)
OK to write letter stating it is OK to increase work hours per day to 5.5 hours but I cannot clear her to drive without clearance from neurology or at least a face to face visit

## 2016-01-17 NOTE — Telephone Encounter (Signed)
The patient stated they only need a letter stating that it is ok to increase her work to 5.5 hrs. Per day and ok for her now to drive to work / Doctors appts.   Fax then to HR on previous FMLA form.

## 2016-01-17 NOTE — Telephone Encounter (Signed)
Caller name: Relationship to patient: Self Can be reached: [phone] Pharmacy:  Reason for call: Patient called stating that she needs her FMLA updated to state that she can increase her hours at work to 5.5/day.  She also request a referral to Dermatology

## 2016-01-17 NOTE — Telephone Encounter (Signed)
Printed previous, called the patient no answer/no voice mail

## 2016-01-17 NOTE — Telephone Encounter (Signed)
Either print her last FMLA if I can just alter or have her get [place] a new form

## 2016-01-17 NOTE — Telephone Encounter (Signed)
Patient informed of PCP instructions. She stated this entire message was to go to Dr. Lorelei Pont since she had seen her throughout this entire situation, but evidently was misunderstood when phone call came.  I informed the patient that I would route this message regarding letter for driving to Dr. Lorelei Pont.  The patient is needing this letter to be able to drive faxed on Monday am.

## 2016-01-17 NOTE — Telephone Encounter (Signed)
Printed letter and will fax to Kelly Splinter at [phone]. Called the patient to inform of PCP instructions regarding letter. Left a msg. To call back

## 2016-01-20 ENCOUNTER — Telehealth: Payer: Self-pay | Admitting: Family Medicine

## 2016-01-20 ENCOUNTER — Telehealth: Payer: Self-pay | Admitting: Neurology

## 2016-01-20 ENCOUNTER — Ambulatory Visit: Payer: 59 | Admitting: Physical Therapy

## 2016-01-20 DIAGNOSIS — S060X1A Concussion with loss of consciousness of 30 minutes or less, initial encounter: Secondary | ICD-10-CM
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
## 2016-01-20 NOTE — Telephone Encounter (Signed)
-----   Message from Darreld Mclean, MD sent at 01/20/2016  8:42 AM EDT ----- Hi Dr. Krista Blue, I hope that you are well!  [name] just contacted me asking me to write a letter releasing her to drive. I explained that I cannot do that as there is a letter on the chart from you stating that she cannot drive.  She wondered if this restriction still stands or if she could be released to drive. Could you have your nurse give her a call?  Thanks so much JC

## 2016-01-20 NOTE — Telephone Encounter (Signed)
Pt called- she needs me to re-order her [facility].  I will do this for her

## 2016-01-20 NOTE — Addendum Note (Signed)
Addended by: Marcial Pacas on: 01/20/2016 12:57 PM   Modules accepted: Orders
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
## 2016-01-20 NOTE — Telephone Encounter (Signed)
Called patient and left detailed message with information below (ok per DPR on file).  Provided our number to call back with any questions.

## 2016-01-20 NOTE — Telephone Encounter (Signed)
Patient called asking for a new order be entered for her MRI/Head so that she can schedule it downstairs. Patient states she can not go to [place] to get it done and downstairs is requesting a new order be entered

## 2016-01-21 ENCOUNTER — Telehealth: Payer: Self-pay | Admitting: Neurology

## 2016-01-21 ENCOUNTER — Ambulatory Visit: Payer: 59 | Admitting: Physical Therapy

## 2016-01-21 DIAGNOSIS — M542 Cervicalgia: Secondary | ICD-10-CM | POA: Diagnosis not present

## 2016-01-21 DIAGNOSIS — R293 Abnormal posture: Secondary | ICD-10-CM

## 2016-01-21 DIAGNOSIS — M546 Pain in thoracic spine: Secondary | ICD-10-CM

## 2016-01-21 NOTE — Telephone Encounter (Signed)
[facility] is calling to advise that the patient wants to have her MRI at [facility]  instead of [place]. Destiny can be reached at [phone].

## 2016-01-21 NOTE — Therapy (Signed)
[facility] [address]  [place], [place], [address] Phone: [phone]   Fax:  [phone]  Physical Therapy Treatment  Patient Details  Name: [name] MRN: [id number] Date of Birth: Apr 13, 1966 Referring Provider: Penni Homans, MD   Encounter Date: 01/21/2016      PT End of Session - 01/21/16 1538    Visit Number 11   Number of Visits 15   Date for PT Re-Evaluation 01/31/16   PT Start Time 8329  Pt arrived late   PT Stop Time 1634   PT Time Calculation (min) 56 min   Activity Tolerance Patient tolerated treatment well   Behavior During Therapy [facility] for tasks assessed/performed      Past Medical History:  Diagnosis Date  . Allergic rhinitis   . Anemia   . Anxiety   . Asthma   . Contact dermatitis and eczema   . Diverticulitis    CT Scan  . Esophageal reflux   . Hyperlipidemia, mixed   . IBS (irritable bowel syndrome)   . IC (interstitial cystitis)   . Internal hemorrhoids   . Lumbago   . Mitral valve disorders   . Osteoarthritis 07/08/2015  . Post concussive syndrome     Past Surgical History:  Procedure Laterality Date  . BARTHOLIN CYST MARSUPIALIZATION N/A 12/18/2013   Procedure: BARTHOLIN CYST MARSUPIALIZATION WITH BIOPSY;  Surgeon: Lovenia Kim, MD;  Location: [facility];  Service: Gynecology;  Laterality: N/A;  . CESAREAN SECTION    . DILITATION & CURRETTAGE/HYSTROSCOPY WITH NOVASURE ABLATION N/A 10/14/2012   Procedure: DILATATION & CURETTAGE/HYSTEROSCOPY WITH NOVASURE ABLATION;  Surgeon: Lovenia Kim, MD;  Location: [facility];  Service: Gynecology;  Laterality: N/A;  . MOUTH SURGERY    . SINUS SURGERY WITH INSTATRAK      There were no vitals filed for this visit.      Subjective Assessment - 01/21/16 1544    Subjective Pt reports work shift increased to 5.5 hrs yesterday and noted headache yesterday but much better tolerated today. Hopes to be back to full-time by the end of the  month. Denies neck pain but continues to report stiffness.    Patient Stated Goals "To be able to work w/o pain and to get back to normal workout at the gym"   Currently in Pain? No/denies            [facility] PT Assessment - 01/21/16 1538      Observation/Other Assessments   Focus on Therapeutic Outcomes (FOTO)  Neuromuscular disorder - 83% (17% limitation)  10th visit FOTO - pt delayed in completing            Today'sTreatment  TherEx UBE - lvl 2.44fd/back x 2' each B UE PNF patterns with red TB:   D2 Flexion x10   D1 Flexion x10   D1 Extension x10   D2 Flexion x10 W Row with red TB x15 BATCA Pull-down 10# x10, 15# x10  Mechanical Traction C-spine in hooklying at 10 dg pull - 14#/8#, 60"/20" x12' Moist heat pack to upper thoracic/lower cervical spine           PT Short Term Goals - 12/16/15 1723      PT SHORT TERM GOAL #1   Title Independent with initial HEP by 12/06/15   Status Achieved           PT Long Term Goals - 01/14/16 1423      PT LONG TERM GOAL #1  Title Independent with advanced HEP/gym program as indicated by 01/31/16   Status Partially Met     PT LONG TERM GOAL #2   Title Pt will routinely demonstrate neutral spine and shoulder posture w/o cues by 01/31/16   Status Partially Met  Pt reporting increased awareness but still inconsistent     PT LONG TERM GOAL #3   Title Pt will report ability to sit at work desk for >/= 2 hrs without limitation due to neck/upper thoracic pain by 01/31/16   Status Achieved  Able to work 4 hr/day w/o pain      PT LONG TERM GOAL #4   Title Pt will return to normal gym workout with good awareness of proper body mechanics and no increased neck/upper thoracic pain by 01/31/16   Status Partially Met  Pt has started back to gym using treadmill and using free weights w/o pain but has not attempted any classes yet.               Plan - 01/21/16 1622    Clinical Impression Statement Pt noting benefit  from prone scapular retraction over [place] with HEP, feeling like it made her work more on her posture. Able to increase work shift from 4 to 5.5 hrs yesterday with headache noted the first day, but better tolerance today. Pt tolerating progression to UE PNF diagonals with slight mild discomfort/muscle strain that resolved upon completion of exercises. Pt's POC runs out next week and pt appears on track for discharge, therefore will plan to review/update HEP as indicated over next few visits in prep for upcoming D/C. Pt reports she has lost her prior handouts, therefore reprinted these handouts.   PT Treatment/Interventions Patient/family education;Therapeutic exercise;Manual techniques;Passive range of motion;Taping;Dry needling;Electrical Stimulation;Moist Heat;Ultrasound;Cryotherapy;Traction;Iontophoresis 63m/ml Dexamethasone;Neuromuscular re-education;ADLs/Self Care Home Management   PT Next Visit Plan Review/update HEP as indicated; Progress postural training & Scapular strengthening/stabilization; Manual therapy & Modalites PRN for pain; Continue mechanical traction & progress as tol if benefit noted   Consulted and Agree with Plan of Care Patient      Patient will benefit from skilled therapeutic intervention in order to improve the following deficits and impairments:  Pain, Postural dysfunction, Increased muscle spasms, Impaired flexibility, Decreased range of motion, Decreased strength  Visit Diagnosis: Cervicalgia  Pain in thoracic spine  Abnormal posture     Problem List Patient Active Problem List   Diagnosis Date Noted  . Concussion with loss of consciousness 01/08/2016  . Passed out 01/08/2016  . Osteoarthritis 07/08/2015  . Acute upper respiratory infection 05/30/2015  . Hematuria 01/20/2015  . Need for prophylactic vaccination and inoculation against influenza 01/20/2015  . Hyperlipidemia, mild 01/20/2015  . Head congestion 01/20/2015  . Fatigue 10/24/2014  . Chest  pressure 10/24/2014  . Sinusitis, acute maxillary 10/11/2014  . Preventative health care 06/03/2014  . TMJ arthralgia 08/20/2013  . Diverticulosis 07/01/2011  . Perimenopausal 03/25/2011  . Fibroids 10/24/2010  . FLATULENCE-GAS-BLOATING 05/26/2010  . Anemia 03/25/2010  . IRRITABLE BOWEL SYNDROME 03/25/2010  . Mitral valve disorder 07/09/2007  . Asthma, exogenous 07/09/2007  . ESOPHAGEAL REFLUX 07/09/2007  . DERMATITIS 07/09/2007  . Allergic rhinitis due to pollen 12/14/2006  . LOW BACK PAIN 12/14/2006    JPercival Spanish PT, MPT 01/21/2016, 4:36 PM  [facility] [place] [address]Phone: [phone]  Fax:  [phone] Name: RVivi PiccirilliMRN: [id number] of Birth: [birth year]

## 2016-01-22 ENCOUNTER — Other Ambulatory Visit: Payer: 59

## 2016-01-22 ENCOUNTER — Ambulatory Visit (INDEPENDENT_AMBULATORY_CARE_PROVIDER_SITE_OTHER): Payer: 59 | Admitting: Family Medicine

## 2016-01-22 ENCOUNTER — Encounter: Payer: Self-pay | Admitting: Family Medicine

## 2016-01-22 ENCOUNTER — Other Ambulatory Visit: Payer: Self-pay | Admitting: Neurology

## 2016-01-22 VITALS — BP 111/74 | HR 100 | Temp 99.1°F | Ht 59.0 in | Wt 98.4 lb

## 2016-01-22 DIAGNOSIS — R55 Syncope and collapse: Secondary | ICD-10-CM | POA: Diagnosis not present

## 2016-01-22 DIAGNOSIS — Z23 Encounter for immunization: Secondary | ICD-10-CM

## 2016-01-22 DIAGNOSIS — S060X0D Concussion without loss of consciousness, subsequent encounter: Secondary | ICD-10-CM

## 2016-01-22 DIAGNOSIS — S060X1A Concussion with loss of consciousness of 30 minutes or less, initial encounter: Secondary | ICD-10-CM | POA: Diagnosis not present

## 2016-01-22 DIAGNOSIS — F0781 Postconcussional syndrome: Secondary | ICD-10-CM | POA: Diagnosis not present

## 2016-01-22 NOTE — Patient Instructions (Signed)
Please let me know what your neurologist at [facility] has to say Please give Dr. Stanford Breed a call- cardiology, Main: [phone] and schedule a follow-up visit As you know, we are still considering the possibility of a seizure and you are encouraged not to drive
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
## 2016-01-22 NOTE — Progress Notes (Signed)
Pre visit review using our clinic review tool, if applicable. No additional management support is needed unless otherwise documented below in the visit note. 

## 2016-01-22 NOTE — Telephone Encounter (Signed)
I called [facility] and got their fax number [phone] to fax over [place] MRI order to them today to [place]. The Auth for [facility] is [phone] exp. 03/02/16

## 2016-01-23 ENCOUNTER — Ambulatory Visit: Payer: 59

## 2016-01-24 ENCOUNTER — Ambulatory Visit: Payer: 59 | Admitting: Family Medicine

## 2016-01-24 ENCOUNTER — Telehealth: Payer: Self-pay | Admitting: Family Medicine

## 2016-01-24 NOTE — Telephone Encounter (Signed)
Patient informed of PCP instructions. Put cologuard at the front for her to sign/then front desk will return to me I then will fax to cologuard The patient is aware of these instructions and agreed to all.

## 2016-01-24 NOTE — Telephone Encounter (Signed)
Caller name: Relationship to patient: Self Can be reached: [phone] Pharmacy:  Reason for call: Patient called to ask provider if she can order a CT of the brain so that she can get it done at the same time she gets the MRI tomorrow. Patient also would like to have an order for Colo-guard.

## 2016-01-24 NOTE — Telephone Encounter (Signed)
I am willing to set her up for Cologuard for colon cancer screen but she does not need a CT of head at same time as MRI of head, will not get any further details and I doubt insurance would pay for both

## 2016-01-25 ENCOUNTER — Other Ambulatory Visit (HOSPITAL_BASED_OUTPATIENT_CLINIC_OR_DEPARTMENT_OTHER): Payer: 59

## 2016-01-25 ENCOUNTER — Ambulatory Visit (HOSPITAL_BASED_OUTPATIENT_CLINIC_OR_DEPARTMENT_OTHER)
Admission: RE | Admit: 2016-01-25 | Discharge: 2016-01-25 | Disposition: A | Discharge status: Home / Self Care | Payer: 59 | Source: Ambulatory Visit | Attending: Neurology | Admitting: Neurology

## 2016-01-25 DIAGNOSIS — S060X0D Concussion without loss of consciousness, subsequent encounter: Secondary | ICD-10-CM | POA: Diagnosis not present

## 2016-01-25 DIAGNOSIS — X58XXXA Exposure to other specified factors, initial encounter: Secondary | ICD-10-CM | POA: Diagnosis not present

## 2016-01-27 ENCOUNTER — Ambulatory Visit: Payer: 59 | Attending: Family Medicine | Admitting: Physical Therapy

## 2016-01-27 ENCOUNTER — Telehealth: Payer: Self-pay | Admitting: Cardiology

## 2016-01-27 ENCOUNTER — Telehealth: Payer: Self-pay | Admitting: Neurology

## 2016-01-27 DIAGNOSIS — M546 Pain in thoracic spine: Secondary | ICD-10-CM | POA: Diagnosis present

## 2016-01-27 DIAGNOSIS — R293 Abnormal posture: Secondary | ICD-10-CM | POA: Diagnosis present

## 2016-01-27 DIAGNOSIS — M542 Cervicalgia: Secondary | ICD-10-CM | POA: Diagnosis not present
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
## 2016-01-27 NOTE — Therapy (Signed)
[facility] [address]  [place], [place], [address] Phone: [phone]   Fax:  [phone]  Physical Therapy Treatment  Patient Details  Name: [name] MRN: [id number] Date of Birth: 09/04/1965 Referring Provider: Penni Homans, MD   Encounter Date: 01/27/2016      PT End of Session - 01/27/16 1404    Visit Number 12   Number of Visits 15   Date for PT Re-Evaluation 01/31/16   PT Start Time 1404  Pt arrived late   PT Stop Time 1507   PT Time Calculation (min) 63 min   Activity Tolerance Patient tolerated treatment well   Behavior During Therapy [facility] for tasks assessed/performed      Past Medical History:  Diagnosis Date  . Allergic rhinitis   . Anemia   . Anxiety   . Asthma   . Contact dermatitis and eczema   . Diverticulitis    CT Scan  . Esophageal reflux   . Hyperlipidemia, mixed   . IBS (irritable bowel syndrome)   . IC (interstitial cystitis)   . Internal hemorrhoids   . Lumbago   . Mitral valve disorders   . Osteoarthritis 07/08/2015  . Post concussive syndrome     Past Surgical History:  Procedure Laterality Date  . BARTHOLIN CYST MARSUPIALIZATION N/A 12/18/2013   Procedure: BARTHOLIN CYST MARSUPIALIZATION WITH BIOPSY;  Surgeon: Lovenia Kim, MD;  Location: [facility];  Service: Gynecology;  Laterality: N/A;  . CESAREAN SECTION    . DILITATION & CURRETTAGE/HYSTROSCOPY WITH NOVASURE ABLATION N/A 10/14/2012   Procedure: DILATATION & CURETTAGE/HYSTEROSCOPY WITH NOVASURE ABLATION;  Surgeon: Lovenia Kim, MD;  Location: [facility];  Service: Gynecology;  Laterality: N/A;  . MOUTH SURGERY    . SINUS SURGERY WITH INSTATRAK      There were no vitals filed for this visit.      Subjective Assessment - 01/27/16 1406    Subjective Pt reports she had an episode of severe vertigo yesterday after working out for 2 hours followed by walking 4.5 miles w/o eating. Pt does not think there was any  positional component to vertigo. Reports MRI completed on Sat and per pt, results were unremarkable.   Diagnostic tests 01/25/16 MRI of brain: Mild findings of chronic microvascular disease. Otherwise unremarkable MRI of the brain.   Patient Stated Goals "To be able to work w/o pain and to get back to normal workout at the gym"   Currently in Pain? No/denies            Today'sTreatment  TherEx UBE - lvl 3.54fd/back x 3' each Seated chin tuck 10x3" Seated stretches:    B UT & LS x30" each    B SCM x30" Doorway pec stretch - low/mid/high x30" each B Scapular retraction/Shoulder rows with green TB 15x3" Standing against 1/2 FR on wall:  (demonstrated against inside of doorframe for HEP completion) B Shoulder Horiz ABD with green TB x15    B Alt Shoulder Flexion + Opp Shoulder Extension with green TB x15 Prone over orange (55 cm) Pball - Scapular retraction +    I  1# x10    T  1# x10    W  1# x10    Y  1# x10  Mechanical Traction C-spine in hooklying at 10 dg pull - 14#/8#, 60"/20" x10' with Moist heat pack to upper thoracic/lower cervical spine          PT Education - 01/27/16 1442  Education provided Yes   Education Details Review of HEP + addition of SCM stretch   Person(s) Educated Patient   Methods Explanation;Demonstration;Handout   Comprehension Verbalized understanding;Returned demonstration          PT Short Term Goals - 12/16/15 1723      PT SHORT TERM GOAL #1   Title Independent with initial HEP by 12/06/15   Status Achieved           PT Long Term Goals - 01/14/16 1423      PT LONG TERM GOAL #1   Title Independent with advanced HEP/gym program as indicated by 01/31/16   Status Partially Met     PT LONG TERM GOAL #2   Title Pt will routinely demonstrate neutral spine and shoulder posture w/o cues by 01/31/16   Status Partially Met  Pt reporting increased awareness but still inconsistent     PT LONG TERM GOAL #3   Title Pt will  report ability to sit at work desk for >/= 2 hrs without limitation due to neck/upper thoracic pain by 01/31/16   Status Achieved  Able to work 4 hr/day w/o pain      PT LONG TERM GOAL #4   Title Pt will return to normal gym workout with good awareness of proper body mechanics and no increased neck/upper thoracic pain by 01/31/16   Status Partially Met  Pt has started back to gym using treadmill and using free weights w/o pain but has not attempted any classes yet.               Plan - 01/27/16 1414    Clinical Impression Statement Reviewed HEP with stretching added for B SCM due to pt c/o tightness here. Pt creatively changing prone exercises over Pball with addition of theraband (pt thought this was the way she had been instructed but upon further examination exercises determined to have originated from material that came with physioball). Pt able to demonstrate Pball exercises as instructed by PT with addition of 1# db today. Current HEP remains appropriate with pt noting conitnued benefit. Will plan for final assessment at next visit and anticipate D/C from PT at that time.   PT Treatment/Interventions Patient/family education;Therapeutic exercise;Manual techniques;Passive range of motion;Taping;Dry needling;Electrical Stimulation;Moist Heat;Ultrasound;Cryotherapy;Traction;Iontophoresis 85m/ml Dexamethasone;Neuromuscular re-education;ADLs/Self Care Home Management   PT Next Visit Plan Assess progress toward goals for anticipated D/C   Consulted and Agree with Plan of Care Patient      Patient will benefit from skilled therapeutic intervention in order to improve the following deficits and impairments:  Pain, Postural dysfunction, Increased muscle spasms, Impaired flexibility, Decreased range of motion, Decreased strength  Visit Diagnosis: Cervicalgia  Pain in thoracic spine  Abnormal posture     Problem List Patient Active Problem List   Diagnosis Date Noted  . Post  concussive syndrome 01/22/2016  . Concussion with loss of consciousness 01/08/2016  . Passed out 01/08/2016  . Osteoarthritis 07/08/2015  . Acute upper respiratory infection 05/30/2015  . Hematuria 01/20/2015  . Need for prophylactic vaccination and inoculation against influenza 01/20/2015  . Hyperlipidemia, mild 01/20/2015  . Head congestion 01/20/2015  . Fatigue 10/24/2014  . Chest pressure 10/24/2014  . Sinusitis, acute maxillary 10/11/2014  . Preventative health care 06/03/2014  . TMJ arthralgia 08/20/2013  . Diverticulosis 07/01/2011  . Perimenopausal 03/25/2011  . Fibroids 10/24/2010  . FLATULENCE-GAS-BLOATING 05/26/2010  . Anemia 03/25/2010  . IRRITABLE BOWEL SYNDROME 03/25/2010  . Mitral valve disorder 07/09/2007  . Asthma, exogenous 07/09/2007  .  ESOPHAGEAL REFLUX 07/09/2007  . DERMATITIS 07/09/2007  . Allergic rhinitis due to pollen 12/14/2006  . LOW BACK PAIN 12/14/2006    Percival Spanish, PT, MPT 01/27/2016, 3:28 PM  [facility] [address]  [place], [place], [address] Phone: [phone]   Fax:  [phone]  Name: [name] MRN: [id number] Date of Birth: Aug 28, 1965

## 2016-01-27 NOTE — Telephone Encounter (Signed)
The MRI was ordered by Dr. Krista Blue.  Reviewed results with the patient.  She will be seeing [facility] for her care in the future (closer to her home).  She was pleasant during our conversation.

## 2016-01-27 NOTE — Telephone Encounter (Addendum)
Left patient message with results (ok per DPR on file).  Provided our call back number in case she has questions.

## 2016-01-27 NOTE — Telephone Encounter (Signed)
Please call patient, MRI of the brain showed no significant abnormality.

## 2016-01-27 NOTE — Telephone Encounter (Signed)
Pt returned RN's call. She is confused as she said another provider (Dr Edilia Bo) ordered the MRI. She wants to know if the results show concussion or seizure activity.  The pt is upset that the results were given to Dr Krista Blue.  She said she is not seeing Dr Krista Blue anymore. She is requesting medical records, I will send her a release

## 2016-01-27 NOTE — Telephone Encounter (Signed)
[name] is calling because she has been having episodes of passing out and  her bp has been running very low. Please call

## 2016-01-29 ENCOUNTER — Other Ambulatory Visit: Payer: 59

## 2016-01-30 ENCOUNTER — Ambulatory Visit: Payer: 59 | Admitting: Physical Therapy

## 2016-01-30 DIAGNOSIS — M542 Cervicalgia: Secondary | ICD-10-CM | POA: Diagnosis not present

## 2016-01-30 DIAGNOSIS — M546 Pain in thoracic spine: Secondary | ICD-10-CM

## 2016-01-30 DIAGNOSIS — R293 Abnormal posture: Secondary | ICD-10-CM

## 2016-01-30 NOTE — Therapy (Signed)
[facility] [address]  [place], [place], [address] Phone: [phone]   Fax:  [phone]  Physical Therapy Treatment  Patient Details  Name: [name] MRN: [id number] Date of Birth: 04/13/1966 Referring Provider: Penni Homans, MD  Encounter Date: 01/30/2016      PT End of Session - 01/30/16 1406    Visit Number 13   Number of Visits 15   Date for PT Re-Evaluation 01/31/16   PT Start Time 1406  Pt arrived late   PT Stop Time 1456   PT Time Calculation (min) 50 min   Activity Tolerance Patient tolerated treatment well   Behavior During Therapy [facility] for tasks assessed/performed      Past Medical History:  Diagnosis Date  . Allergic rhinitis   . Anemia   . Anxiety   . Asthma   . Contact dermatitis and eczema   . Diverticulitis    CT Scan  . Esophageal reflux   . Hyperlipidemia, mixed   . IBS (irritable bowel syndrome)   . IC (interstitial cystitis)   . Internal hemorrhoids   . Lumbago   . Mitral valve disorders   . Osteoarthritis 07/08/2015  . Post concussive syndrome     Past Surgical History:  Procedure Laterality Date  . BARTHOLIN CYST MARSUPIALIZATION N/A 12/18/2013   Procedure: BARTHOLIN CYST MARSUPIALIZATION WITH BIOPSY;  Surgeon: Lovenia Kim, MD;  Location: [facility];  Service: Gynecology;  Laterality: N/A;  . CESAREAN SECTION    . DILITATION & CURRETTAGE/HYSTROSCOPY WITH NOVASURE ABLATION N/A 10/14/2012   Procedure: DILATATION & CURETTAGE/HYSTEROSCOPY WITH NOVASURE ABLATION;  Surgeon: Lovenia Kim, MD;  Location: [facility];  Service: Gynecology;  Laterality: N/A;  . MOUTH SURGERY    . SINUS SURGERY WITH INSTATRAK      There were no vitals filed for this visit.      Subjective Assessment - 01/30/16 1407    Subjective Pt continues to deny pain, but reports limited by vertigo at this point.    Diagnostic tests 01/25/16 MRI of brain: Mild findings of chronic microvascular disease.  Otherwise unremarkable MRI of the brain.   Patient Stated Goals "To be able to work w/o pain and to get back to normal workout at the gym"   Currently in Pain? No/denies            [facility] PT Assessment - 01/30/16 1406      Assessment   Medical Diagnosis Neck/upper back pain    Referring Provider Penni Homans, MD   Onset Date/Surgical Date 10/16/15   Hand Dominance Right     Observation/Other Assessments   Focus on Therapeutic Outcomes (FOTO)  Neuromuscular disorder - 86% (14% limitation)  10th visit FOTO - pt delayed in completing      AROM   Overall AROM Comments Thoracic B shoulder ROM WNL   Cervical Flexion 49   Cervical Extension 65   Cervical - Right Side Bend 47   Cervical - Left Side Bend 48   Cervical - Right Rotation 69   Cervical - Left Rotation 72     Strength   Right Shoulder Flexion 5/5   Right Shoulder ABduction 5/5   Right Shoulder Internal Rotation 4+/5   Right Shoulder External Rotation 4+/5   Left Shoulder Flexion 5/5   Left Shoulder ABduction 5/5   Left Shoulder Internal Rotation 4+/5   Left Shoulder External Rotation 4+/5           Today'sTreatment  TherEx UBE - lvl 3.62fd/back x 3' each  ROM/MMT check  Goal assessment  B Dix Hall-pike - negative for spinning or nystagmus  Mechanical Traction C-spine in hooklying at 10 dg pull - 14#/8#, 60"/20" x10' with Moist heat pack to upper thoracic/lower cervical spine           PT Short Term Goals - 12/16/15 1723      PT SHORT TERM GOAL #1   Title Independent with initial HEP by 12/06/15   Status Achieved           PT Long Term Goals - 01/30/16 1419      PT LONG TERM GOAL #1   Title Independent with advanced HEP/gym program as indicated by 01/31/16   Status Achieved     PT LONG TERM GOAL #2   Title Pt will routinely demonstrate neutral spine and shoulder posture w/o cues by 01/31/16   Status Achieved     PT LONG TERM GOAL #3   Title Pt will report ability to sit at work  desk for >/= 2 hrs without limitation due to neck/upper thoracic pain by 01/31/16   Status Achieved  Able to work 5.5 hr/day w/o pain      PT LONG TERM GOAL #4   Title Pt will return to normal gym workout with good awareness of proper body mechanics and no increased neck/upper thoracic pain by 01/31/16   Status Achieved  Pt has decided that she wants to defer returning to gym while waiting for further evaluation of concussion & vertigo, but plans to continue with home yoga DVD's. Pt does demonstrate awareness of good posture & body mechanics.               Plan - 01/30/16 1443    Clinical Impression Statement Pt pleased with progress with PT, noting no recent pain and decreased tension/tightness in neck. Pt much more aware of posture and body mechanics and reports good self-monitoring while at work and exercising. Cervical and B shoudler ROM WNL and bilateral shoudler strength >/= 4+/5. All goals for current POC met. Pt recently reporting some vertigo, but screen with B Dix Hall-pike negative for BPPV. Pt will be following up with ENT and neurologist and may benefit from vestibular rehab pending findings from MDs. Will proceed with plan to transition to HEP and D/C from PT for this episode.   PT Treatment/Interventions Patient/family education;Therapeutic exercise;Manual techniques;Passive range of motion;Taping;Dry needling;Electrical Stimulation;Moist Heat;Ultrasound;Cryotherapy;Traction;Iontophoresis 452mml Dexamethasone;Neuromuscular re-education;ADLs/Self Care Home Management   PT Next Visit Plan Discharge   Consulted and Agree with Plan of Care Patient      Patient will benefit from skilled therapeutic intervention in order to improve the following deficits and impairments:  Pain, Postural dysfunction, Increased muscle spasms, Impaired flexibility, Decreased range of motion, Decreased strength  Visit Diagnosis: Cervicalgia  Pain in thoracic spine  Abnormal  posture     Problem List Patient Active Problem List   Diagnosis Date Noted  . Post concussive syndrome 01/22/2016  . Concussion with loss of consciousness 01/08/2016  . Passed out 01/08/2016  . Osteoarthritis 07/08/2015  . Acute upper respiratory infection 05/30/2015  . Hematuria 01/20/2015  . Need for prophylactic vaccination and inoculation against influenza 01/20/2015  . Hyperlipidemia, mild 01/20/2015  . Head congestion 01/20/2015  . Fatigue 10/24/2014  . Chest pressure 10/24/2014  . Sinusitis, acute maxillary 10/11/2014  . Preventative health care 06/03/2014  . TMJ arthralgia 08/20/2013  . Diverticulosis 07/01/2011  . Perimenopausal 03/25/2011  . Fibroids  10/24/2010  . FLATULENCE-GAS-BLOATING 05/26/2010  . Anemia 03/25/2010  . IRRITABLE BOWEL SYNDROME 03/25/2010  . Mitral valve disorder 07/09/2007  . Asthma, exogenous 07/09/2007  . ESOPHAGEAL REFLUX 07/09/2007  . DERMATITIS 07/09/2007  . Allergic rhinitis due to pollen 12/14/2006  . LOW BACK PAIN 12/14/2006    Percival Spanish, PT, MPT 01/30/2016, 3:52 PM  [facility] [address]  [place], [place], [address] Phone: [phone]   Fax:  [phone]  Name: [name] MRN: [id number] Date of Birth: [birth year]   PHYSICAL THERAPY DISCHARGE SUMMARY  Visits from Start of Care: 13  Current functional level related to goals / functional outcomes:    Refer to above clinical impression.    Remaining deficits:    As above. Pt to continue to monitor posture and proceed with HEP.   Education / Equipment:    HEP, postural education  Plan: Patient agrees to discharge.  Patient goals were met. Patient is being discharged due to meeting the stated rehab goals.  ?????    Percival Spanish, PT, MPT 01/30/16, 3:54 PM  [facility] [address]  [place], [place], [address] Phone:  [phone]   Fax:  [phone]

## 2016-01-31 ENCOUNTER — Ambulatory Visit (INDEPENDENT_AMBULATORY_CARE_PROVIDER_SITE_OTHER): Payer: 59 | Admitting: Cardiology

## 2016-01-31 ENCOUNTER — Encounter: Payer: Self-pay | Admitting: Cardiology

## 2016-01-31 VITALS — BP 110/68 | HR 82 | Ht 59.0 in | Wt 99.2 lb

## 2016-01-31 DIAGNOSIS — I059 Rheumatic mitral valve disease, unspecified: Secondary | ICD-10-CM

## 2016-01-31 DIAGNOSIS — F0781 Postconcussional syndrome: Secondary | ICD-10-CM

## 2016-01-31 DIAGNOSIS — IMO0002 Reserved for concepts with insufficient information to code with codable children: Secondary | ICD-10-CM

## 2016-01-31 DIAGNOSIS — R55 Syncope and collapse: Secondary | ICD-10-CM | POA: Diagnosis not present
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
## 2016-01-31 NOTE — Assessment & Plan Note (Signed)
History of MVP- not seen on echo May 2017
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
## 2016-01-31 NOTE — Patient Instructions (Signed)
Schedule 30 day event monitor    Your physician recommends that you schedule a follow-up appointment in: 1 month with Dr.Crenshaw after monitor     NO DRIVING UNTIL CLEARED WITH NEUROLOGY

## 2016-02-03 ENCOUNTER — Telehealth: Payer: Self-pay | Admitting: Family Medicine

## 2016-02-03 NOTE — Telephone Encounter (Signed)
Patient has been on FMLA and she states that she just finished physical therapy down stairs. She is currently working until 1:30 everyday however she would like to work a full day on Tuesdays and Thursday, while still keeping Mon, Weds, and Fri until 1:30. She would like to start this tomorrow so she would like [place] to fax a new updated note to [facility]. She states that we already have the fax number. She would like a call back to confirm that we can do this for her. Please advise  Patient relation: self Patient phone: [phone]

## 2016-02-04 ENCOUNTER — Ambulatory Visit: Payer: 59 | Admitting: Cardiology

## 2016-02-05 NOTE — Telephone Encounter (Signed)
Tried to contact to inform her that paperwork has been faxed to her job Attn: Mickel Baas. No answer, left message.

## 2016-02-05 NOTE — Telephone Encounter (Signed)
Note done- please call pt and let her know

## 2016-02-06 NOTE — Telephone Encounter (Signed)
Tried to contact pt, no answer, left information regarding letter that was faxed to pt's job on 02/05/16 per pt's request.

## 2016-02-06 NOTE — Telephone Encounter (Signed)
Pt called in to speak with CMA. She says that she has a question about the form that was sent in to her job. She would like a call back when possible. She says that her main concern is the dates. Please call pt back. If no answer, okay to lvm

## 2016-02-12 ENCOUNTER — Ambulatory Visit (INDEPENDENT_AMBULATORY_CARE_PROVIDER_SITE_OTHER): Payer: 59

## 2016-02-12 DIAGNOSIS — R55 Syncope and collapse: Secondary | ICD-10-CM | POA: Diagnosis not present

## 2016-02-12 DIAGNOSIS — IMO0002 Reserved for concepts with insufficient information to code with codable children: Secondary | ICD-10-CM

## 2016-02-16 ENCOUNTER — Encounter: Payer: Self-pay | Admitting: Family Medicine

## 2016-02-16 LAB — COLOGUARD: COLOGUARD: NEGATIVE

## 2016-02-19 ENCOUNTER — Ambulatory Visit: Payer: 59 | Admitting: Cardiology

## 2016-02-24 ENCOUNTER — Encounter: Payer: Self-pay | Admitting: Family Medicine

## 2016-02-27 NOTE — Telephone Encounter (Signed)
error:[id number] ° °

## 2016-03-04 ENCOUNTER — Ambulatory Visit: Payer: 59 | Admitting: Neurology

## 2016-03-12 ENCOUNTER — Other Ambulatory Visit: Payer: Self-pay | Admitting: Obstetrics and Gynecology

## 2016-03-12 DIAGNOSIS — R928 Other abnormal and inconclusive findings on diagnostic imaging of breast: Secondary | ICD-10-CM

## 2016-03-18 ENCOUNTER — Ambulatory Visit
Admission: RE | Admit: 2016-03-18 | Discharge: 2016-03-18 | Disposition: A | Discharge status: Home / Self Care | Payer: 59 | Source: Ambulatory Visit | Attending: Obstetrics and Gynecology | Admitting: Obstetrics and Gynecology

## 2016-03-18 DIAGNOSIS — R928 Other abnormal and inconclusive findings on diagnostic imaging of breast: Secondary | ICD-10-CM

## 2016-03-21 ENCOUNTER — Ambulatory Visit (INDEPENDENT_AMBULATORY_CARE_PROVIDER_SITE_OTHER): Payer: 59 | Admitting: Family

## 2016-03-21 ENCOUNTER — Encounter: Payer: Self-pay | Admitting: Family

## 2016-03-21 DIAGNOSIS — R21 Rash and other nonspecific skin eruption: Secondary | ICD-10-CM | POA: Diagnosis not present

## 2016-03-21 MED ORDER — TRIAMCINOLONE ACETONIDE 0.1 % EX CREA: 1.0000 "application " | TOPICAL_CREAM | Freq: Two times a day (BID) | CUTANEOUS | 0 refills | Status: DC

## 2016-03-21 NOTE — Assessment & Plan Note (Signed)
Rash appears allergic in nature and has responded to OTC cortisone cream previously. Start triamcinolone cream. Follow up if symptoms do not improve or worsen.

## 2016-03-21 NOTE — Progress Notes (Signed)
Pre visit review using our clinic review tool, if applicable. No additional management support is needed unless otherwise documented below in the visit note. 

## 2016-03-21 NOTE — Patient Instructions (Signed)
Thank you for choosing [facility].  SUMMARY AND INSTRUCTIONS:  Medication:  Your prescription(s) have been submitted to your pharmacy or been printed and provided for you. Please take as directed and contact our office if you believe you are having problem(s) with the medication(s) or have any questions.  Follow up:  If your symptoms worsen or fail to improve, please contact our office for further instruction, or in case of emergency go directly to the emergency room at the closest medical facility.     Rash A rash is a change in the color of the skin. A rash can also change the way your skin feels. There are many different conditions and factors that can cause a rash. Follow these instructions at home: Pay attention to any changes in your symptoms. Follow these instructions to help with your condition: Medicine  Take or apply over-the-counter and prescription medicines only as told by your health care provider. These may include:  Corticosteroid cream.  Anti-itch lotions.  Oral antihistamines. Skin Care  Apply cool compresses to the affected areas.  Try taking a bath with:  Epsom salts. Follow the instructions on the packaging. You can get these at your local pharmacy or grocery store.  Baking soda. Pour a small amount into the bath as told by your health care provider.  Colloidal oatmeal. Follow the instructions on the packaging. You can get this at your local pharmacy or grocery store.  Try applying baking soda paste to your skin. Stir water into baking soda until it reaches a paste-like consistency.  Do not scratch or rub your skin.  Avoid covering the rash. Make sure the rash is exposed to air as much as possible. General instructions  Avoid hot showers or baths, which can make itching worse. A cold shower may help.  Avoid scented soaps, detergents, and perfumes. Use gentle soaps, detergents, perfumes, and other cosmetic products.  Avoid any substance  that causes your rash. Keep a journal to help track what causes your rash. Write down:  What you eat.  What cosmetic products you use.  What you drink.  What you wear. This includes jewelry.  Keep all follow-up visits as told by your health care provider. This is important. Contact a health care provider if:  You sweat at night.  You lose weight.  You urinate more than normal.  You feel weak.  You vomit.  Your skin or the whites of your eyes look yellow (jaundice).  Your skin:  Tingles.  Is numb.  Your rash:  Does not go away after several days.  Gets worse.  You are:  Unusually thirsty.  More tired than normal.  You have:  New symptoms.  Pain in your abdomen.  A fever.  Diarrhea. Get help right away if:  You develop a rash that covers all or most of your body. The rash may or may not be painful.  You develop blisters that:  Are on top of the rash.  Grow larger or grow together.  Are painful.  Are inside your nose or mouth.  You develop a rash that:  Looks like purple pinprick-sized spots all over your body.  Has a "bull's eye" or looks like a target.  Is not related to sun exposure, is red and painful, and causes your skin to peel. This information is not intended to replace advice given to you by your health care provider. Make sure you discuss any questions you have with your health care provider. Document Released: 04/03/2002  Document Revised: 09/17/2015 Document Reviewed: 08/29/2014 Elsevier Interactive Patient Education  2017 [employer].

## 2016-03-21 NOTE — Progress Notes (Signed)
Subjective:    Patient ID: [name], female    DOB: [birth year], 50 y.o.   MRN: [id number]  Chief Complaint  Patient presents with  . Rash    Biliateral side of neck. pt states that is has been there on and off for about 2 weeks. cortisone cream has helped but rash comes back.     HPI:  [name] is a 50 y.o. female who  has a past medical history of Allergic rhinitis; Anemia; Anxiety; Asthma; Contact dermatitis and eczema; Diverticulitis; Esophageal reflux; Hyperlipidemia, mixed; IBS (irritable bowel syndrome); IC (interstitial cystitis); Internal hemorrhoids; Lumbago; Mitral valve disorders(424.0); Osteoarthritis (07/08/2015); and Post concussive syndrome. and presents today for an acute office visit.  This is a new problem. Associated symptom of a rash located on the bilateral sides of her neck has been waxing and waning for about 2 weeks. Over the counter cortisone cream has helped in the past. Described as bumpy, raised, and itchy. Denies any fevers. Has used different types of shower gels with no changes to other laundering or hair products. Rash initially started on the left side and has progressed to the right.                                                                       Allergies  Allergen Reactions  . Aspirin Shortness Of Breath    "irritated stomach" Ibuprofen and Naproxen do not agree with her either  . Latex Shortness Of Breath and Itching  . Other Shortness Of Breath    peanuts  . Sulfamethoxazole-Trimethoprim Shortness Of Breath  . Sulfonamide Derivatives Shortness Of Breath and Itching  . Sulphadimidine Sodium [Sulfamethazine Sodium] Shortness Of Breath    Added to food for preservative  . Amoxicillin     Doesn't remember rxn  . Azithromycin Other (See Comments)    Faintness  . Carisoprodol   . Ciprofloxacin     REACTION: SOB, "Tightness in head"  . Clarithromycin     "Doesn't agree with me"  . Codeine     Does not take percocet or  hydrocodone; tylenol only  . Epinephrine     Heart racing  . Metronidazole Other (See Comments)    "cannot tolerate"  . Nsaids   . Penicillins     Doesn't remember reaction      Outpatient Medications Prior to Visit  Medication Sig Dispense Refill  . acetaminophen (TYLENOL) 500 MG tablet Take 500 mg by mouth every 6 (six) hours as needed.    Marland Kitchen azelastine (ASTELIN) 0.1 % nasal spray Place 2 sprays into both nostrils 2 (two) times daily. Use in each nostril as directed 30 mL 6  . fluticasone (FLONASE) 50 MCG/ACT nasal spray Place 2 sprays into both nostrils daily. 16 g 6  . montelukast (SINGULAIR) 10 MG tablet Take 1 tablet (10 mg total) by mouth at bedtime. 30 tablet 3   No facility-administered medications prior to visit.       Past Surgical History:  Procedure Laterality Date  . BARTHOLIN CYST MARSUPIALIZATION N/A 12/18/2013   Procedure: BARTHOLIN CYST MARSUPIALIZATION WITH BIOPSY;  Surgeon: Lovenia Kim, MD;  Location: [facility];  Service: Gynecology;  Laterality: N/A;  . CESAREAN SECTION    . DILITATION &  CURRETTAGE/HYSTROSCOPY WITH NOVASURE ABLATION N/A 10/14/2012   Procedure: DILATATION & CURETTAGE/HYSTEROSCOPY WITH NOVASURE ABLATION;  Surgeon: Lovenia Kim, MD;  Location: [facility];  Service: Gynecology;  Laterality: N/A;  . MOUTH SURGERY    . SINUS SURGERY WITH INSTATRAK        Past Medical History:  Diagnosis Date  . Allergic rhinitis   . Anemia   . Anxiety   . Asthma   . Contact dermatitis and eczema   . Diverticulitis    CT Scan  . Esophageal reflux   . Hyperlipidemia, mixed   . IBS (irritable bowel syndrome)   . IC (interstitial cystitis)   . Internal hemorrhoids   . Lumbago   . Mitral valve disorders(424.0)   . Osteoarthritis 07/08/2015  . Post concussive syndrome       Review of Systems  Constitutional: Negative for chills and fever.  Skin: Positive for color change and rash.  Neurological: Negative for weakness and numbness.        Objective:    BP 100/68 (BP Location: Left Arm, Patient Position: Sitting, Cuff Size: Normal)   Pulse 85   Temp 97.5 F (36.4 C) (Oral)   Ht 4\' 11"  (1.499 m)   Wt 103 lb 12 oz (47.1 kg)   SpO2 99%   BMI 20.95 kg/m  Nursing note and vital signs reviewed.  Physical Exam  Constitutional: She is oriented to person, place, and time. She appears well-developed and well-nourished. No distress.  Cardiovascular: Normal rate, regular rhythm, normal heart sounds and intact distal pulses.   Pulmonary/Chest: Effort normal and breath sounds normal.  Neurological: She is alert and oriented to person, place, and time.  Skin: Skin is warm and dry.  Mild erythematous raised plague that is red in color located on bilateral sides of her neck in small groupings. No discharge or evidence of infection. No vesicles noted.   Psychiatric: She has a normal mood and affect. Her behavior is normal. Judgment and thought content normal.       Assessment & Plan:   Problem List Items Addressed This Visit      Musculoskeletal and Integument   Rash and nonspecific skin eruption    Rash appears allergic in nature and has responded to OTC cortisone cream previously. Start triamcinolone cream. Follow up if symptoms do not improve or worsen.       Relevant Medications   triamcinolone cream (KENALOG) 0.1 %       I am having Ms. Pickart start on triamcinolone cream. I am also having her maintain her fluticasone, azelastine, acetaminophen, and montelukast.   Meds ordered this encounter  Medications  . triamcinolone cream (KENALOG) 0.1 %    Sig: Apply 1 application topically 2 (two) times daily.    Dispense:  30 g    Refill:  0    Order Specific Question:   Supervising Provider    Answer:   Pricilla Holm A [[id number]]     Follow-up: Return if symptoms worsen or fail to improve.  Mauricio Po, FNP

## 2016-03-25 ENCOUNTER — Ambulatory Visit (INDEPENDENT_AMBULATORY_CARE_PROVIDER_SITE_OTHER): Payer: 59 | Admitting: Family Medicine

## 2016-03-25 ENCOUNTER — Encounter: Payer: Self-pay | Admitting: Family Medicine

## 2016-03-25 VITALS — BP 95/76 | HR 76 | Temp 98.0°F | Ht 59.0 in | Wt 105.8 lb

## 2016-03-25 DIAGNOSIS — F0781 Postconcussional syndrome: Secondary | ICD-10-CM | POA: Diagnosis not present

## 2016-03-25 NOTE — Patient Instructions (Signed)
If you wish, it would be reasonable to repeat your mammogram in 6 months instead of a year if you are concerned We can have you return to full 8 hour work days; however let me know if this is not working out for you. Continuing your lunchtime break/ nap is a good idea!
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
## 2016-03-25 NOTE — Progress Notes (Signed)
Pre visit review using our clinic review tool, if applicable. No additional management support is needed unless otherwise documented below in the visit note. 
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
## 2016-03-30 ENCOUNTER — Telehealth: Payer: Self-pay | Admitting: Family Medicine

## 2016-03-30 NOTE — Telephone Encounter (Signed)
-----   Message from Emi Holes, [place] sent at 03/27/2016  1:36 PM EST ----- Regarding: FMLA Spoke with pt who states that her job requires we add to her FMLA to allow her intermittent FMLA to allow pt to go to her doctor appointments. Pt ask that we use the same FMLA paperwork that we faxed in before but just add to it that she is able to leave work intermittently for her appointments.   After paperwork has been updated we need to fax back to pt's job per pt request. Please advise.

## 2016-03-30 NOTE — Telephone Encounter (Signed)
Did addendum to [facility] from August  Allow for MD appt 1-2x per week, up to 3 hours a visit.  Will fax in for her

## 2016-04-01 ENCOUNTER — Ambulatory Visit: Payer: 59 | Admitting: Cardiology

## 2016-04-08 ENCOUNTER — Ambulatory Visit: Payer: 59 | Admitting: Neurology

## 2016-04-16 ENCOUNTER — Ambulatory Visit: Payer: 59 | Admitting: Family Medicine

## 2016-04-16 ENCOUNTER — Telehealth: Payer: Self-pay | Admitting: Family Medicine

## 2016-04-16 NOTE — Telephone Encounter (Signed)
Caller name:Erendira Basso Relationship to patient: Can be reached: Pharmacy:  Reason for call:Patient called and left message on VM on 04/15/16 to cancel appointment for 04/16/16. Patient called again on 04/16/16 to make sure we received message. We did receive voicemail, appointment has been canceled.
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
## 2016-05-06 ENCOUNTER — Ambulatory Visit: Payer: 59 | Admitting: Cardiology

## 2016-05-07 ENCOUNTER — Telehealth: Payer: Self-pay | Admitting: Cardiology

## 2016-05-07 NOTE — Telephone Encounter (Signed)
Attempt to return call-no answer, lmtcb. 

## 2016-05-07 NOTE — Telephone Encounter (Signed)
Returned call to patient-patient states she needs to keep her appointment on Monday and would like to have blood work Monday as well.    Advised appointment is scheduled for Monday and she will be able to have lab work completed after OV.    Pt verbalized understanding.

## 2016-05-07 NOTE — Telephone Encounter (Signed)
New message    Call patient to see if appt on Monday could be move up to 1/12 .   Patient declined to been off work.    Patient is requesting labs to be drawn on same day if possible.

## 2016-05-08 ENCOUNTER — Ambulatory Visit: Payer: 59 | Admitting: Physician Assistant

## 2016-05-11 ENCOUNTER — Encounter: Payer: Self-pay | Admitting: Physician Assistant

## 2016-05-11 ENCOUNTER — Ambulatory Visit (INDEPENDENT_AMBULATORY_CARE_PROVIDER_SITE_OTHER): Payer: 59 | Admitting: Physician Assistant

## 2016-05-11 VITALS — BP 121/50 | HR 104 | Ht 59.0 in | Wt 106.8 lb

## 2016-05-11 DIAGNOSIS — Z79899 Other long term (current) drug therapy: Secondary | ICD-10-CM

## 2016-05-11 DIAGNOSIS — R079 Chest pain, unspecified: Secondary | ICD-10-CM

## 2016-05-11 DIAGNOSIS — E785 Hyperlipidemia, unspecified: Secondary | ICD-10-CM | POA: Diagnosis not present

## 2016-05-11 DIAGNOSIS — D649 Anemia, unspecified: Secondary | ICD-10-CM

## 2016-05-11 DIAGNOSIS — Z87898 Personal history of other specified conditions: Secondary | ICD-10-CM

## 2016-05-11 DIAGNOSIS — M79651 Pain in right thigh: Secondary | ICD-10-CM
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
## 2016-05-11 NOTE — Patient Instructions (Signed)
Medication Instructions:  Your physician recommends that you continue on your current medications as directed. Please refer to the Current Medication list given to you today.  Labwork: Have lab work completed today.  Testing/Procedures: NONE  Follow-Up: Follow up with Dr. Stanford Breed in 1 YEAR or sooner if needed.    If you need a refill on your cardiac medications before your next appointment, please call your pharmacy.

## 2016-05-19 ENCOUNTER — Encounter: Payer: Self-pay | Admitting: Family Medicine

## 2016-05-19 ENCOUNTER — Ambulatory Visit (INDEPENDENT_AMBULATORY_CARE_PROVIDER_SITE_OTHER): Payer: 59 | Admitting: Family Medicine

## 2016-05-19 ENCOUNTER — Other Ambulatory Visit: Payer: Self-pay

## 2016-05-19 ENCOUNTER — Telehealth: Payer: Self-pay | Admitting: Cardiovascular Disease

## 2016-05-19 VITALS — BP 130/80 | HR 96 | Temp 98.1°F | Resp 16 | Ht 59.0 in | Wt 107.8 lb

## 2016-05-19 DIAGNOSIS — J029 Acute pharyngitis, unspecified: Secondary | ICD-10-CM

## 2016-05-19 DIAGNOSIS — R319 Hematuria, unspecified: Secondary | ICD-10-CM | POA: Diagnosis not present

## 2016-05-19 DIAGNOSIS — J324 Chronic pansinusitis: Secondary | ICD-10-CM

## 2016-05-19 DIAGNOSIS — R350 Frequency of micturition: Secondary | ICD-10-CM | POA: Diagnosis not present

## 2016-05-19 LAB — POC URINALSYSI DIPSTICK (AUTOMATED)
BILIRUBIN UA: NEGATIVE
Glucose, UA: NEGATIVE
KETONES UA: NEGATIVE
LEUKOCYTES UA: NEGATIVE
Nitrite, UA: NEGATIVE
PROTEIN UA: NEGATIVE
Spec Grav, UA: 1.02
Urobilinogen, UA: NEGATIVE
pH, UA: 6

## 2016-05-19 LAB — POCT RAPID STREP A (OFFICE): Rapid Strep A Screen: NEGATIVE

## 2016-05-19 MED ORDER — AZITHROMYCIN 250 MG PO TABS: ORAL_TABLET | ORAL | 0 refills | Status: DC

## 2016-05-19 MED ORDER — FLUTICASONE PROPIONATE 50 MCG/ACT NA SUSP: 2.0000 | Freq: Every day | NASAL | 6 refills | Status: DC

## 2016-05-19 NOTE — Patient Instructions (Signed)

## 2016-05-19 NOTE — Telephone Encounter (Signed)
New Message   Pt said she was referred to a lab that is open on Saturdays. Unsure of which lab it is she needs to go to. Requesting a call back from nurse.

## 2016-05-19 NOTE — Progress Notes (Signed)
Subjective:    Patient ID: [name], female    DOB: 08-16-1965, 51 y.o.   MRN: [id number]  Chief Complaint  Patient presents with  . Cough    ears feels clogged, congestion, for 1 week, has taken zantac and alavert  . left flank pain    urinary urgency, for 2 days    HPI Patient is in today for c/o flank pain and urinary frequency x few days .  She is also c/o nasal congestion and sinus pressure x 3 weeks.  She is using otc meds with no relief.  No fevers.  No nvd.    Past Medical History:  Diagnosis Date  . Allergic rhinitis   . Anemia   . Anxiety   . Asthma   . Contact dermatitis and eczema   . Diverticulitis    CT Scan  . Esophageal reflux   . Hyperlipidemia, mixed   . IBS (irritable bowel syndrome)   . IC (interstitial cystitis)   . Internal hemorrhoids   . Lumbago   . Mitral valve disorders(424.0)   . Osteoarthritis 07/08/2015  . Post concussive syndrome     Past Surgical History:  Procedure Laterality Date  . BARTHOLIN CYST MARSUPIALIZATION N/A 12/18/2013   Procedure: BARTHOLIN CYST MARSUPIALIZATION WITH BIOPSY;  Surgeon: Lovenia Kim, MD;  Location: [facility];  Service: Gynecology;  Laterality: N/A;  . CESAREAN SECTION    . DILITATION & CURRETTAGE/HYSTROSCOPY WITH NOVASURE ABLATION N/A 10/14/2012   Procedure: DILATATION & CURETTAGE/HYSTEROSCOPY WITH NOVASURE ABLATION;  Surgeon: Lovenia Kim, MD;  Location: [facility];  Service: Gynecology;  Laterality: N/A;  . MOUTH SURGERY    . SINUS SURGERY WITH INSTATRAK      Family History  Problem Relation Age of Onset  . Colon polyps Sister   . Mental illness Sister     anxiety from 9/11 survivors  . Varicose Veins Sister   . Diabetes Mother   . Hypertension Mother   . Heart attack Mother     MI at age 39  . Alcohol abuse Mother     smoker  . Heart disease Mother   . Dementia Mother   . Hypertension Father   . Hyperlipidemia Father   . Heart attack Father   . Cancer Father     lung cancer, smoker    . Cancer Brother     acute leukemia  . Dementia Maternal Grandmother   . Heart disease Maternal Grandfather     mi  . Cancer Paternal Grandmother     GYN cancer  . Other Sister     hypoglycemia  . Varicose Veins Sister   . Asthma Sister     rheumatoid  . Fibromyalgia Sister   . Mental retardation Sister     depression  . GER disease Son   . Alcohol abuse      Family history  . Colon cancer Neg Hx     Social History   Social History  . Marital status: Married    Spouse name: N/A  . Number of children: 2  . Years of education: Masters   Occupational History  . Farmers    Social History Main Topics  . Smoking status: Never Smoker  . Smokeless tobacco: Never Used  . Alcohol use Yes     Comment: Holidays only  . Drug use: No  . Sexual activity: Yes     Comment: lives with husband and son, work with [employer] as a Clinical research associate,  aoivd gluten, dairy   Other Topics Concern  . Not on file   Social History Narrative   Lives at home home with husband and son.   Right-handed.   1 cup caffeine daily.    Outpatient Medications Prior to Visit  Medication Sig Dispense Refill  . acetaminophen (TYLENOL) 500 MG tablet Take 500 mg by mouth every 6 (six) hours as needed.    Marland Kitchen azelastine (ASTELIN) 0.1 % nasal spray Place 2 sprays into both nostrils 2 (two) times daily. Use in each nostril as directed 30 mL 6  . fluticasone (FLONASE) 50 MCG/ACT nasal spray Place 2 sprays into both nostrils daily. 16 g 6  . KRILL OIL PO Take by mouth.    . montelukast (SINGULAIR) 10 MG tablet Take 1 tablet (10 mg total) by mouth at bedtime. 30 tablet 3  . triamcinolone cream (KENALOG) 0.1 % Apply 1 application topically 2 (two) times daily. 30 g 0   No facility-administered medications prior to visit.     Allergies  Allergen Reactions  . Aspirin Shortness Of Breath    "irritated stomach" Ibuprofen and Naproxen do not agree with her either  . Latex Shortness Of Breath and  Itching  . Other Shortness Of Breath    peanuts  . Sulfamethoxazole-Trimethoprim Shortness Of Breath  . Sulfonamide Derivatives Shortness Of Breath and Itching  . Sulphadimidine Sodium [Sulfamethazine Sodium] Shortness Of Breath    Added to food for preservative  . Amoxicillin     Doesn't remember rxn  . Azithromycin Other (See Comments)    Faintness  . Carisoprodol   . Ciprofloxacin     REACTION: SOB, "Tightness in head"  . Clarithromycin     "Doesn't agree with me"  . Codeine     Does not take percocet or hydrocodone; tylenol only  . Epinephrine     Heart racing  . Metronidazole Other (See Comments)    "cannot tolerate"  . Nsaids   . Penicillins     Doesn't remember reaction    Review of Systems  Constitutional: Negative for fever and malaise/fatigue.  HENT: Positive for congestion.        Both ears feels clogged  Eyes: Negative for blurred vision.  Respiratory: Positive for cough. Negative for shortness of breath.   Cardiovascular: Negative for chest pain, palpitations and leg swelling.  Gastrointestinal: Negative for vomiting.  Genitourinary: Positive for flank pain.       Left side  Musculoskeletal: Negative for back pain.  Skin: Negative for rash.  Neurological: Negative for loss of consciousness and headaches.       Objective:    Physical Exam  Constitutional: She is oriented to person, place, and time. She appears well-developed and well-nourished. No distress.  HENT:  Head: Normocephalic and atraumatic.  Right Ear: Tympanic membrane and ear canal normal.  Left Ear: Tympanic membrane and ear canal normal.  Nose: Rhinorrhea present. Right sinus exhibits maxillary sinus tenderness and frontal sinus tenderness. Left sinus exhibits maxillary sinus tenderness and frontal sinus tenderness.  Eyes: Conjunctivae are normal.  Neck: Normal range of motion. No thyromegaly present.  Cardiovascular: Normal rate and regular rhythm.   Pulmonary/Chest: Effort normal and  breath sounds normal. She has no wheezes.  Abdominal: Soft. Bowel sounds are normal. There is tenderness in the suprapubic area. There is no rebound.  Musculoskeletal: Normal range of motion. She exhibits no edema or deformity.  Neurological: She is alert and oriented to person, place, and time.  Skin: Skin is  warm and dry. She is not diaphoretic.  Psychiatric: She has a normal mood and affect.  Nursing note and vitals reviewed.   BP 130/80 (BP Location: Right Arm, Cuff Size: Normal)   Pulse 96   Temp 98.1 F (36.7 C) (Oral)   Resp 16   Ht 4\' 11"  (1.499 m)   Wt 107 lb 12.8 oz (48.9 kg)   SpO2 98%   BMI 21.77 kg/m  Wt Readings from Last 3 Encounters:  05/19/16 107 lb 12.8 oz (48.9 kg)  05/11/16 106 lb 12.8 oz (48.4 kg)  03/25/16 105 lb 12.8 oz (48 kg)     Lab Results  Component Value Date   WBC 5.0 12/09/2015   HGB 14.0 12/09/2015   HCT 40.7 12/09/2015   PLT 305.0 12/09/2015   GLUCOSE 102 (H) 12/09/2015   CHOL 189 07/05/2015   TRIG 130.0 07/05/2015   HDL 51.70 07/05/2015   LDLDIRECT 142.7 03/02/2012   LDLCALC 111 (H) 07/05/2015   ALT 14 12/09/2015   AST 18 12/09/2015   NA 138 12/09/2015   K 3.9 12/09/2015   CL 102 12/09/2015   CREATININE 0.56 12/09/2015   BUN 16 12/09/2015   CO2 31 12/09/2015   TSH 1.26 12/09/2015   HGBA1C 4.8 12/12/2010    Lab Results  Component Value Date   TSH 1.26 12/09/2015   Lab Results  Component Value Date   WBC 5.0 12/09/2015   HGB 14.0 12/09/2015   HCT 40.7 12/09/2015   MCV 97.8 12/09/2015   PLT 305.0 12/09/2015   Lab Results  Component Value Date   NA 138 12/09/2015   K 3.9 12/09/2015   CO2 31 12/09/2015   GLUCOSE 102 (H) 12/09/2015   BUN 16 12/09/2015   CREATININE 0.56 12/09/2015   BILITOT 0.7 12/09/2015   ALKPHOS 63 12/09/2015   AST 18 12/09/2015   ALT 14 12/09/2015   PROT 7.1 12/09/2015   ALBUMIN 4.2 12/09/2015   CALCIUM 9.5 12/09/2015   GFR 121.48 12/09/2015   Lab Results  Component Value Date   CHOL 189  07/05/2015   Lab Results  Component Value Date   HDL 51.70 07/05/2015   Lab Results  Component Value Date   LDLCALC 111 (H) 07/05/2015   Lab Results  Component Value Date   TRIG 130.0 07/05/2015   Lab Results  Component Value Date   CHOLHDL 4 07/05/2015   Lab Results  Component Value Date   HGBA1C 4.8 12/12/2010      I acted as a Education administrator for Dr. Carollee Herter.  Guerry Bruin, CMA  Assessment & Plan:   Problem List Items Addressed This Visit      Unprioritized   Hematuria    With some flank pain--?renal colic Urine culture pending Strain urine--- Consider imaging if pain cont        Other Visit Diagnoses    Urinary frequency    -  Primary   Relevant Orders   POCT Urinalysis Dipstick (Automated) (Completed)   Urine culture   Pharyngitis, unspecified etiology       Relevant Orders   POCT rapid strep A (Completed)   Pansinusitis, unspecified chronicity       Relevant Medications   fluticasone (FLONASE) 50 MCG/ACT nasal spray   azithromycin (ZITHROMAX) 250 MG tablet       I have discontinued Ms. Feldpausch's fluticasone, azelastine, acetaminophen, montelukast, triamcinolone cream, and KRILL OIL PO. I am also having her start on fluticasone and azithromycin.  Meds ordered this encounter  Medications  .  fluticasone (FLONASE) 50 MCG/ACT nasal spray    Sig: Place 2 sprays into both nostrils daily.    Dispense:  16 g    Refill:  6  . azithromycin (ZITHROMAX) 250 MG tablet    Sig: Take 2 tablets the first day. Then take 1 tablet for 4 days.    Dispense:  6 tablet    Refill:  0    CMA served as scribe during this visit. History, Physical and Plan performed by medical provider. Documentation and orders reviewed and attested to.   Ann Held, DO

## 2016-05-19 NOTE — Telephone Encounter (Signed)
Returned call to patient left message on personal voice mail [place] lab at [facility] opened on Saturdays from 8:00 am to 12:00.

## 2016-05-19 NOTE — Progress Notes (Signed)
Pre visit review using our clinic review tool, if applicable. No additional management support is needed unless otherwise documented below in the visit note. 

## 2016-05-20 DIAGNOSIS — Z131 Encounter for screening for diabetes mellitus: Secondary | ICD-10-CM | POA: Diagnosis not present

## 2016-05-20 DIAGNOSIS — N644 Mastodynia: Secondary | ICD-10-CM | POA: Diagnosis not present

## 2016-05-20 DIAGNOSIS — Z118 Encounter for screening for other infectious and parasitic diseases: Secondary | ICD-10-CM | POA: Diagnosis not present

## 2016-05-20 DIAGNOSIS — Z130 Encounter for screening for diseases of the blood and blood-forming organs and certain disorders involving the immune mechanism: Secondary | ICD-10-CM | POA: Diagnosis not present

## 2016-05-20 DIAGNOSIS — R319 Hematuria, unspecified: Secondary | ICD-10-CM | POA: Diagnosis not present

## 2016-05-20 NOTE — Assessment & Plan Note (Signed)
With some flank pain--?renal colic Urine culture pending Strain urine--- Consider imaging if pain cont

## 2016-05-21 LAB — URINE CULTURE

## 2016-05-26 DIAGNOSIS — M542 Cervicalgia: Secondary | ICD-10-CM | POA: Diagnosis not present

## 2016-05-27 ENCOUNTER — Other Ambulatory Visit: Payer: Self-pay | Admitting: Obstetrics and Gynecology

## 2016-05-27 DIAGNOSIS — N644 Mastodynia: Secondary | ICD-10-CM

## 2016-06-05 ENCOUNTER — Other Ambulatory Visit: Payer: 59

## 2016-06-24 DIAGNOSIS — M542 Cervicalgia: Secondary | ICD-10-CM | POA: Diagnosis not present

## 2016-06-26 ENCOUNTER — Other Ambulatory Visit: Payer: 59

## 2016-07-09 ENCOUNTER — Other Ambulatory Visit: Payer: 59

## 2016-07-10 ENCOUNTER — Ambulatory Visit
Admission: RE | Admit: 2016-07-10 | Discharge: 2016-07-10 | Disposition: A | Discharge status: Home / Self Care | Payer: 59 | Source: Ambulatory Visit | Attending: Obstetrics and Gynecology | Admitting: Obstetrics and Gynecology

## 2016-07-10 DIAGNOSIS — N644 Mastodynia: Secondary | ICD-10-CM

## 2016-07-10 DIAGNOSIS — R922 Inconclusive mammogram: Secondary | ICD-10-CM | POA: Diagnosis not present

## 2016-07-14 ENCOUNTER — Ambulatory Visit (INDEPENDENT_AMBULATORY_CARE_PROVIDER_SITE_OTHER): Payer: 59 | Admitting: Family Medicine

## 2016-07-14 ENCOUNTER — Encounter: Payer: Self-pay | Admitting: Family Medicine

## 2016-07-14 VITALS — BP 118/72 | HR 82 | Temp 98.2°F | Resp 18 | Wt 111.2 lb

## 2016-07-14 DIAGNOSIS — R1032 Left lower quadrant pain: Secondary | ICD-10-CM | POA: Diagnosis not present

## 2016-07-14 DIAGNOSIS — R351 Nocturia: Secondary | ICD-10-CM | POA: Diagnosis not present

## 2016-07-14 DIAGNOSIS — E785 Hyperlipidemia, unspecified: Secondary | ICD-10-CM | POA: Diagnosis not present

## 2016-07-14 DIAGNOSIS — K219 Gastro-esophageal reflux disease without esophagitis: Secondary | ICD-10-CM

## 2016-07-14 DIAGNOSIS — J324 Chronic pansinusitis: Secondary | ICD-10-CM | POA: Diagnosis not present

## 2016-07-14 DIAGNOSIS — Z0000 Encounter for general adult medical examination without abnormal findings: Secondary | ICD-10-CM | POA: Diagnosis not present

## 2016-07-14 DIAGNOSIS — R319 Hematuria, unspecified: Secondary | ICD-10-CM

## 2016-07-14 LAB — POC URINALSYSI DIPSTICK (AUTOMATED)
BILIRUBIN UA: NEGATIVE
Glucose, UA: NEGATIVE
Ketones, UA: NEGATIVE
Leukocytes, UA: NEGATIVE
NITRITE UA: NEGATIVE
Protein, UA: NEGATIVE
Spec Grav, UA: 1.02 (ref 1.030–1.035)
Urobilinogen, UA: 0.2 (ref ?–2.0)
pH, UA: 6 (ref 5.0–8.0)

## 2016-07-14 MED ORDER — FLUTICASONE PROPIONATE 50 MCG/ACT NA SUSP: 2.0000 | Freq: Every day | NASAL | 6 refills | Status: DC

## 2016-07-14 NOTE — Progress Notes (Signed)
Pre visit review using our clinic review tool, if applicable. No additional management support is needed unless otherwise documented below in the visit note. 

## 2016-07-14 NOTE — Patient Instructions (Addendum)
Encouraged increased rest and hydration 64 oz, add probiotics, zinc such as Coldeze or Xicam. Treat fevers as needed. Mucinex twice daily, Elderberry, aged or black garlic capsules, vitamin C 500 to 1000 mg daily and [place]  Probiotics daily, [email or web address], [employer] company  Vitamin D 2000 IU   Cetaphil, Witch Hazel Astringent and benzoyl astringent Icool   Multivitamin with minerals Preventive Care 40-64 Years, Female Preventive care refers to lifestyle choices and visits with your health care provider that can promote health and wellness. What does preventive care include?  A yearly physical exam. This is also called an annual well check.  Dental exams once or twice a year.  Routine eye exams. Ask your health care provider how often you should have your eyes checked.  Personal lifestyle choices, including:  Daily care of your teeth and gums.  Regular physical activity.  Eating a healthy diet.  Avoiding tobacco and drug use.  Limiting alcohol use.  Practicing safe sex.  Taking low-dose aspirin daily starting at age 52.  Taking vitamin and mineral supplements as recommended by your health care provider. What happens during an annual well check? The services and screenings done by your health care provider during your annual well check will depend on your age, overall health, lifestyle risk factors, and family history of disease. Counseling  Your health care provider may ask you questions about your:  Alcohol use.  Tobacco use.  Drug use.  Emotional well-being.  Home and relationship well-being.  Sexual activity.  Eating habits.  Work and work Statistician.  Method of birth control.  Menstrual cycle.  Pregnancy history. Screening  You may have the following tests or measurements:  Height, weight, and BMI.  Blood pressure.  Lipid and cholesterol levels. These may be checked every 5 years, or more frequently if you are over 52  years old.  Skin check.  Lung cancer screening. You may have this screening every year starting at age 52 if you have a 30-pack-year history of smoking and currently smoke or have quit within the past 15 years.  Fecal occult blood test (FOBT) of the stool. You may have this test every year starting at age 52.  Flexible sigmoidoscopy or colonoscopy. You may have a sigmoidoscopy every 5 years or a colonoscopy every 10 years starting at age 52.  Hepatitis C blood test.  Hepatitis B blood test.  Sexually transmitted disease (STD) testing.  Diabetes screening. This is done by checking your blood sugar (glucose) after you have not eaten for a while (fasting). You may have this done every 1-3 years.  Mammogram. This may be done every 1-2 years. Talk to your health care provider about when you should start having regular mammograms. This may depend on whether you have a family history of breast cancer.  BRCA-related cancer screening. This may be done if you have a family history of breast, ovarian, tubal, or peritoneal cancers.  Pelvic exam and Pap test. This may be done every 3 years starting at age 13. Starting at age 52, this may be done every 5 years if you have a Pap test in combination with an HPV test.  Bone density scan. This is done to screen for osteoporosis. You may have this scan if you are at high risk for osteoporosis. Discuss your test results, treatment options, and if necessary, the need for more tests with your health care provider. Vaccines  Your health care provider may recommend certain vaccines, such as:  Influenza  vaccine. This is recommended every year.  Tetanus, diphtheria, and acellular pertussis (Tdap, Td) vaccine. You may need a Td booster every 10 years.  Varicella vaccine. You may need this if you have not been vaccinated.  Zoster vaccine. You may need this after age 31.  Measles, mumps, and rubella (MMR) vaccine. You may need at least one dose of MMR if you  were born in 1957 or later. You may also need a second dose.  Pneumococcal 13-valent conjugate (PCV13) vaccine. You may need this if you have certain conditions and were not previously vaccinated.  Pneumococcal polysaccharide (PPSV23) vaccine. You may need one or two doses if you smoke cigarettes or if you have certain conditions.  Meningococcal vaccine. You may need this if you have certain conditions.  Hepatitis A vaccine. You may need this if you have certain conditions or if you travel or work in places where you may be exposed to hepatitis A.  Hepatitis B vaccine. You may need this if you have certain conditions or if you travel or work in places where you may be exposed to hepatitis B.  Haemophilus influenzae type b (Hib) vaccine. You may need this if you have certain conditions. Talk to your health care provider about which screenings and vaccines you need and how often you need them. This information is not intended to replace advice given to you by your health care provider. Make sure you discuss any questions you have with your health care provider. Document Released: 05/10/2015 Document Revised: 01/01/2016 Document Reviewed: 02/12/2015 Elsevier Interactive Patient Education  2017 [employer].

## 2016-07-14 NOTE — Progress Notes (Signed)
Subjective:  I acted as a Education administrator for Dr. Charlett Blake. [place], [place]   Patient ID: [name], female    DOB: 01-02-1966, 51 y.o.   MRN: [id number]  Chief Complaint  Patient presents with  . Annual Exam  . Abdominal Pain    HPI  Patient is in today for an annual exam. Patient c/o left lower abdomen pain and head congestion, she endorses recent history of malaise, fatigue, head congestion, headache and sinus pressure. No fevers or chills. She has been following with Dr Si Raider of gyn for her care. She is doing well with ADLs she is noting some perimenopausal symptoms especially irritability. SUBJECTIVE: Denies CP/palp/SOB/HA/congestion/fevers or GU c/o. Taking meds as prescribed   Patient Care Team: Darreld Mclean, MD as PCP - General (Family Medicine) Irene Shipper, MD as Consulting Physician (Gastroenterology) Brien Few, MD as Consulting Physician (Obstetrics and Gynecology) Deneise Lever, MD as Consulting Physician (Pulmonary Disease)   Past Medical History:  Diagnosis Date  . Allergic rhinitis   . Anemia   . Anxiety   . Asthma   . Contact dermatitis and eczema   . Diverticulitis    CT Scan  . Esophageal reflux   . Hyperlipidemia, mixed   . IBS (irritable bowel syndrome)   . IC (interstitial cystitis)   . Internal hemorrhoids   . Lumbago   . Mitral valve disorders(424.0)   . Osteoarthritis 07/08/2015  . Post concussive syndrome     Past Surgical History:  Procedure Laterality Date  . BARTHOLIN CYST MARSUPIALIZATION N/A 12/18/2013   Procedure: BARTHOLIN CYST MARSUPIALIZATION WITH BIOPSY;  Surgeon: Lovenia Kim, MD;  Location: [facility];  Service: Gynecology;  Laterality: N/A;  . CESAREAN SECTION    . DILITATION & CURRETTAGE/HYSTROSCOPY WITH NOVASURE ABLATION N/A 10/14/2012   Procedure: DILATATION & CURETTAGE/HYSTEROSCOPY WITH NOVASURE ABLATION;  Surgeon: Lovenia Kim, MD;  Location: [facility];  Service: Gynecology;  Laterality: N/A;  . MOUTH SURGERY    .  SINUS SURGERY WITH INSTATRAK      Family History  Problem Relation Age of Onset  . Colon polyps Sister   . Mental illness Sister     anxiety from 9/11 survivors  . Varicose Veins Sister   . Diabetes Mother   . Hypertension Mother   . Heart attack Mother     MI at age 59  . Alcohol abuse Mother     smoker  . Heart disease Mother   . Dementia Mother   . Hypertension Father   . Hyperlipidemia Father   . Heart attack Father   . Cancer Father     lung cancer, smoker  . Cancer Brother     acute leukemia  . Dementia Maternal Grandmother   . Heart disease Maternal Grandfather     mi  . Cancer Paternal Grandmother     GYN cancer  . Other Sister     hypoglycemia  . Varicose Veins Sister   . Asthma Sister     rheumatoid  . Fibromyalgia Sister   . Mental retardation Sister     depression  . GER disease Son   . Alcohol abuse      Family history  . Colon cancer Neg Hx     Social History   Social History  . Marital status: Married    Spouse name: N/A  . Number of children: 2  . Years of education: Masters   Occupational History  . [employer]  Social History Main Topics  . Smoking status: Never Smoker  . Smokeless tobacco: Never Used  . Alcohol use Yes     Comment: Holidays only  . Drug use: No  . 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 Not on file   Social History Narrative   Lives at home home with husband and son.   Right-handed.   1 cup caffeine daily.    Outpatient Medications Prior to Visit  Medication Sig Dispense Refill  . fluticasone (FLONASE) 50 MCG/ACT nasal spray Place 2 sprays into both nostrils daily. 16 g 6  . azithromycin (ZITHROMAX) 250 MG tablet Take 2 tablets the first day. Then take 1 tablet for 4 days. 6 tablet 0   No facility-administered medications prior to visit.     Allergies  Allergen Reactions  . Aspirin  Shortness Of Breath    "irritated stomach" Ibuprofen and Naproxen do not agree with her either  . Latex Shortness Of Breath and Itching  . Other Shortness Of Breath    peanuts  . Sulfamethoxazole-Trimethoprim Shortness Of Breath  . Sulfonamide Derivatives Shortness Of Breath and Itching  . Sulphadimidine Sodium [Sulfamethazine Sodium] Shortness Of Breath    Added to food for preservative  . Amoxicillin     Doesn't remember rxn  . Azithromycin Other (See Comments)    Faintness  . Carisoprodol   . Ciprofloxacin     REACTION: SOB, "Tightness in head"  . Clarithromycin     "Doesn't agree with me"  . Codeine     Does not take percocet or hydrocodone; tylenol only  . Epinephrine     Heart racing  . Metronidazole Other (See Comments)    "cannot tolerate"  . Nsaids   . Penicillins     Doesn't remember reaction    Review of Systems  Constitutional: Positive for malaise/fatigue. Negative for fever.  HENT: Positive for congestion, ear pain and sinus pain.   Eyes: Negative for blurred vision.  Respiratory: Negative for cough and shortness of breath.   Cardiovascular: Negative for chest pain, palpitations and leg swelling.  Gastrointestinal: Positive for abdominal pain and nausea. Negative for vomiting.  Musculoskeletal: Negative for back pain.  Skin: Negative for rash.  Neurological: Negative for loss of consciousness and headaches.       Objective:    Physical Exam  Constitutional: She is oriented to person, place, and time. She appears well-developed and well-nourished. No distress.  HENT:  Head: Normocephalic and atraumatic.  Eyes: Conjunctivae are normal.  Neck: Normal range of motion. No thyromegaly present.  Cardiovascular: Normal rate and regular rhythm.   Pulmonary/Chest: Effort normal and breath sounds normal. She has no wheezes.  Abdominal: Soft. Bowel sounds are normal. There is no tenderness.  Musculoskeletal: Normal range of motion. She exhibits no edema or  deformity.  Neurological: She is alert and oriented to person, place, and time.  Skin: Skin is warm and dry. She is not diaphoretic.  Psychiatric: She has a normal mood and affect.    BP 118/72 (BP Location: Left Arm, Patient Position: Sitting, Cuff Size: Normal)   Pulse 82   Temp 98.2 F (36.8 C) (Oral)   Resp 18   Wt 111 lb 3.2 oz (50.4 kg)   LMP 06/30/2016   SpO2 97%   BMI 22.46 kg/m  Wt Readings from Last 3 Encounters:  07/14/16 111 lb  3.2 oz (50.4 kg)  05/19/16 107 lb 12.8 oz (48.9 kg)  05/11/16 106 lb 12.8 oz (48.4 kg)   BP Readings from Last 3 Encounters:  07/14/16 118/72  05/19/16 130/80  05/11/16 (!) 121/50     Immunization History  Administered Date(s) Administered  . Influenza Split 01/26/2011, 01/14/2012, 12/30/2012  . Influenza Whole 01/25/2001, 01/24/2010  . Influenza,inj,Quad PF,36+ Mos 01/03/2014, 01/08/2015, 01/22/2016  . Td 04/27/1998, 12/30/2007    Health Maintenance  Topic Date Due  . HIV Screening  04/22/1980  . MAMMOGRAM  04/23/2015  . PAP SMEAR  05/29/2016  . TETANUS/TDAP  12/29/2017  . Fecal DNA (Cologuard)  02/16/2019  . INFLUENZA VACCINE  Completed    Lab Results  Component Value Date   WBC 5.8 07/14/2016   HGB 13.8 07/14/2016   HCT 40.5 07/14/2016   PLT 280.0 07/14/2016   GLUCOSE 102 (H) 12/09/2015   CHOL 195 07/14/2016   TRIG 143.0 07/14/2016   HDL 51.00 07/14/2016   LDLDIRECT 142.7 03/02/2012   LDLCALC 115 (H) 07/14/2016   ALT 14 12/09/2015   AST 18 12/09/2015   NA 138 12/09/2015   K 3.9 12/09/2015   CL 102 12/09/2015   CREATININE 0.56 12/09/2015   BUN 16 12/09/2015   CO2 31 12/09/2015   TSH 1.26 12/09/2015   HGBA1C 4.8 12/12/2010    Lab Results  Component Value Date   TSH 1.26 12/09/2015   Lab Results  Component Value Date   WBC 5.8 07/14/2016   HGB 13.8 07/14/2016   HCT 40.5 07/14/2016   MCV 99.5 07/14/2016   PLT 280.0 07/14/2016   Lab Results  Component Value Date   NA 138 12/09/2015   K 3.9 12/09/2015     CO2 31 12/09/2015   GLUCOSE 102 (H) 12/09/2015   BUN 16 12/09/2015   CREATININE 0.56 12/09/2015   BILITOT 0.7 12/09/2015   ALKPHOS 63 12/09/2015   AST 18 12/09/2015   ALT 14 12/09/2015   PROT 7.1 12/09/2015   ALBUMIN 4.2 12/09/2015   CALCIUM 9.5 12/09/2015   GFR 121.48 12/09/2015   Lab Results  Component Value Date   CHOL 195 07/14/2016   Lab Results  Component Value Date   HDL 51.00 07/14/2016   Lab Results  Component Value Date   LDLCALC 115 (H) 07/14/2016   Lab Results  Component Value Date   TRIG 143.0 07/14/2016   Lab Results  Component Value Date   CHOLHDL 4 07/14/2016   Lab Results  Component Value Date   HGBA1C 4.8 12/12/2010         Assessment & Plan:   Problem List Items Addressed This Visit    ESOPHAGEAL REFLUX    Avoid offending foods, start probiotics. Do not eat large meals in late evening and consider raising head of bed.       Preventative health care    Patient encouraged to maintain heart healthy diet, regular exercise, adequate sleep. Consider daily probiotics. Take medications as prescribed      Relevant Orders   CBC (Completed)   Pansinusitis - Primary    Encouraged increased rest and hydration, add probiotics, zinc such as Coldeze or Xicam. Treat fevers as needed. Consider antibiotics if worsens      Relevant Medications   fluticasone (FLONASE) 50 MCG/ACT nasal spray   Hematuria    Patient reports long history of having hematuria work up was unremarkable, urinary testing otherwise unremarkable      Relevant Orders   Urine culture (Completed)  Hyperlipidemia, mild    Encouraged heart healthy diet, increase exercise, avoid trans fats, consider a krill oil cap daily      Relevant Orders   CBC (Completed)   Lipid panel (Completed)   Nocturia   Relevant Orders   POCT Urinalysis Dipstick (Automated) (Completed)   Left lower quadrant pain    Proceed with pelvic ultrasound and consider referral if pain persists and no  cause is found on ultrasound      Relevant Orders   US Pelvis Complete (Completed)   US Transvaginal Non-OB (Completed)      I have discontinued Ms. Comella's azithromycin. I am also having her maintain her fluticasone.  Meds ordered this encounter  Medications  . fluticasone (FLONASE) 50 MCG/ACT nasal spray    Sig: Place 2 sprays into both nostrils daily.    Dispense:  16 g    Refill:  6    CMA served as scribe during this visit. History, Physical and Plan performed by medical provider. Documentation and orders reviewed and attested to.  Penni Homans, MD

## 2016-07-14 NOTE — Assessment & Plan Note (Signed)
Encouraged heart healthy diet, increase exercise, avoid trans fats, consider a krill oil cap daily 

## 2016-07-14 NOTE — Assessment & Plan Note (Signed)
Patient encouraged to maintain heart healthy diet, regular exercise, adequate sleep. Consider daily probiotics. Take medications as prescribed 

## 2016-07-15 LAB — CBC
HCT: 40.5 % (ref 36.0–46.0)
Hemoglobin: 13.8 g/dL (ref 12.0–15.0)
MCHC: 34.1 g/dL (ref 30.0–36.0)
MCV: 99.5 fl (ref 78.0–100.0)
PLATELETS: 280 10*3/uL (ref 150.0–400.0)
RBC: 4.07 Mil/uL (ref 3.87–5.11)
RDW: 12.1 % (ref 11.5–15.5)
WBC: 5.8 10*3/uL (ref 4.0–10.5)

## 2016-07-15 LAB — LIPID PANEL
CHOLESTEROL: 195 mg/dL (ref 0–200)
HDL: 51 mg/dL (ref >39.00)
LDL CALC: 115 mg/dL — AB (ref 0–99)
NonHDL: 143.53
TRIGLYCERIDES: 143 mg/dL (ref 0.0–149.0)
Total CHOL/HDL Ratio: 4
VLDL: 28.6 mg/dL (ref 0.0–40.0)

## 2016-07-16 LAB — URINE CULTURE: Organism ID, Bacteria: NO GROWTH

## 2016-07-18 ENCOUNTER — Ambulatory Visit (HOSPITAL_BASED_OUTPATIENT_CLINIC_OR_DEPARTMENT_OTHER)
Admission: RE | Admit: 2016-07-18 | Discharge: 2016-07-18 | Disposition: A | Discharge status: Home / Self Care | Payer: 59 | Source: Ambulatory Visit | Attending: Family Medicine | Admitting: Family Medicine

## 2016-07-18 DIAGNOSIS — D259 Leiomyoma of uterus, unspecified: Secondary | ICD-10-CM | POA: Insufficient documentation

## 2016-07-18 DIAGNOSIS — R1032 Left lower quadrant pain: Secondary | ICD-10-CM

## 2016-07-19 DIAGNOSIS — R351 Nocturia: Secondary | ICD-10-CM | POA: Insufficient documentation

## 2016-07-19 DIAGNOSIS — R1032 Left lower quadrant pain: Secondary | ICD-10-CM | POA: Insufficient documentation

## 2016-07-19 NOTE — Assessment & Plan Note (Signed)
Avoid offending foods, start probiotics. Do not eat large meals in late evening and consider raising head of bed.  

## 2016-07-19 NOTE — Assessment & Plan Note (Signed)
Patient reports long history of having hematuria work up was unremarkable, urinary testing otherwise unremarkable

## 2016-07-19 NOTE — Assessment & Plan Note (Signed)
Encouraged increased rest and hydration, add probiotics, zinc such as Coldeze or Xicam. Treat fevers as needed. Consider antibiotics if worsens

## 2016-07-19 NOTE — Assessment & Plan Note (Signed)
Proceed with pelvic ultrasound and consider referral if pain persists and no cause is found on ultrasound

## 2016-07-30 ENCOUNTER — Telehealth: Payer: Self-pay | Admitting: Family Medicine

## 2016-07-30 NOTE — Telephone Encounter (Signed)
Called her back- explained that we do not order genetic testing from here, but I am glad to set her up to discuss with a genetic counselor if she is concerned about risk (it sounds like a grandmother had either breast or ovarian cancer, she is not quite sure).   She does not wish to pursue this further upon discussion

## 2016-07-30 NOTE — Telephone Encounter (Signed)
Relation to PT:[facility] Call back [phone]   Reason for call:  Patient requesting BRCA testing orders, please advise

## 2016-07-30 NOTE — Telephone Encounter (Signed)
Patient dropped off Physician Results from [facility] placed in paperwork bin

## 2016-08-07 ENCOUNTER — Telehealth: Payer: Self-pay

## 2016-08-07 DIAGNOSIS — R739 Hyperglycemia, unspecified: Secondary | ICD-10-CM

## 2016-08-07 NOTE — Telephone Encounter (Signed)
Spoke with [place], do not know of any lab work that needs to be done from our prospective.

## 2016-08-07 NOTE — Telephone Encounter (Signed)
Received a call from [place] with Dr.Blyth's office.She was calling to find out what lab patient needs.Stated patient told her it was a special lab that checks for plaque,not a lipid panel.After reviewing chart unable to find out lab order Advised I will send message to Dr.Crenshaw's RN Hilda Blades.Call [place] back at # [phone].

## 2016-08-07 NOTE — Telephone Encounter (Signed)
Called patient to make her aware that physical form is complete with the exception of a glucose level.  Pt stated understanding.  Offered a blood glucose fingerstick.  Pt declined stating she would rather have a CMET completed given they she occasionally has issues with dehydration.  She also requested if Dr. Charlett Blake approves, she would like to come to our office to have the blood work ("test that tests plaque build up in your arteries") Dr. Stanford Breed wants her to have drawn at [facility].  Pt states she would rather have blood work drawn all at one place if possible.  Called Dr. Jacalyn Lefevre office and spoke to Elly Modena, LPN to see if she could determine what lab work patient was referring to.  Malachy Mood was uncertain, but stated she would reach out to [place] Dr. Jacalyn Lefevre nurse to see which lab work patient is referring to.  Awaiting call back from [place].

## 2016-08-07 NOTE — Telephone Encounter (Signed)
Hilda Blades called back and explained that after reviewing the chart there are no lab orders pending for patient.    Dr. Thomasena Edis advise on CMET.

## 2016-08-09 NOTE — Telephone Encounter (Signed)
OK for her to do CMP for hyperglycemia. Please arrange

## 2016-08-10 NOTE — Telephone Encounter (Addendum)
Future lab ordered    Tiffany--please call patient and schedule lab appt.

## 2016-08-10 NOTE — Telephone Encounter (Signed)
lvm advising patient of message below °

## 2016-08-13 NOTE — Telephone Encounter (Signed)
Noted  

## 2016-08-13 NOTE — Telephone Encounter (Signed)
Patient informed Comprehensive metabolic panel orders have been placed by Dr. Charlett Blake will come in on Friday to have labs drawn.  Patient states she's unhappy chart doesn't reflect "plaque build up in arties" lab orders from Dr. Stanford Breed office and will contact cardiologist.

## 2016-08-14 NOTE — Telephone Encounter (Signed)
Pt states she called her cardiology office and that per the person she spoke to the orders for the blood work that Dr. Stanford Breed wanted patient to have has been ordered and is the system, but since we are unable to locate order that they would fax over the order to [place].  Awaiting fax.  Pt was informed that we would discuss blood work with Dr. Charlett Blake for approval and then call back to schedule appt.

## 2016-08-18 ENCOUNTER — Telehealth: Payer: Self-pay | Admitting: Cardiology

## 2016-08-18 DIAGNOSIS — R55 Syncope and collapse: Secondary | ICD-10-CM | POA: Diagnosis not present

## 2016-08-18 NOTE — Telephone Encounter (Signed)
Would treat with diet  Kirk Ruths

## 2016-08-18 NOTE — Telephone Encounter (Signed)
Left message for pt to call, do not see labs are needed at this time.

## 2016-08-18 NOTE — Telephone Encounter (Addendum)
Fax not received.  Call Dr. Stanford Breed office and spoke to [place].  Explained the situation to [place].  She said she would look into and call back.

## 2016-08-18 NOTE — Telephone Encounter (Signed)
Spoke with pt, she would lijke to have dr Stanford Breed look over her lipid profile to find out what he would recommend. Labs forwarded to dr Stanford Breed to review.

## 2016-08-18 NOTE — Telephone Encounter (Signed)
lvm advising patient of message below °

## 2016-08-18 NOTE — Telephone Encounter (Signed)
Hilda Blades called back.  Per Hilda Blades, there are no cardiology labs needed at this time.    Tiffany-please call patient and schedule lab appt for CMP.

## 2016-08-19 NOTE — Telephone Encounter (Signed)
Left message for patient of dr crenshaw's recommendations. 

## 2016-08-20 ENCOUNTER — Other Ambulatory Visit (INDEPENDENT_AMBULATORY_CARE_PROVIDER_SITE_OTHER): Payer: 59

## 2016-08-20 DIAGNOSIS — R739 Hyperglycemia, unspecified: Secondary | ICD-10-CM

## 2016-08-21 LAB — COMPREHENSIVE METABOLIC PANEL
ALBUMIN: 4 g/dL (ref 3.5–5.2)
ALT: 15 U/L (ref 0–35)
AST: 16 U/L (ref 0–37)
Alkaline Phosphatase: 76 U/L (ref 39–117)
BUN: 18 mg/dL (ref 6–23)
CHLORIDE: 103 meq/L (ref 96–112)
CO2: 30 mEq/L (ref 19–32)
CREATININE: 0.54 mg/dL (ref 0.40–1.20)
Calcium: 9.6 mg/dL (ref 8.4–10.5)
GFR: 126.34 mL/min (ref >60.00)
Glucose, Bld: 83 mg/dL (ref 70–99)
Potassium: 4 mEq/L (ref 3.5–5.1)
SODIUM: 137 meq/L (ref 135–145)
Total Bilirubin: 0.4 mg/dL (ref 0.2–1.2)
Total Protein: 6.9 g/dL (ref 6.0–8.3)

## 2016-08-25 NOTE — Telephone Encounter (Signed)
Forms faxed as patient requested.  Fax confirmation received.  Copies mailed to patient. Original form numbered and placed in scan bin for scanning.  Pt notified and made aware. No additional needs voiced at this time.

## 2016-08-25 NOTE — Telephone Encounter (Signed)
Patient had lab work done on 08/20/16. I called and explained lab results w/ her she voiced her understanding. A CMP order ws placed and the results are in, patient suggests that her results are sent in with her paperwork and would like the forms faxed to the number provided on the forms as soon as possible. She stated they are for her job and she needs them sent via fax.   Patient request a phone call when the fax is done   Please advise

## 2016-09-10 ENCOUNTER — Encounter: Payer: Self-pay | Admitting: Medical

## 2016-09-10 ENCOUNTER — Ambulatory Visit (INDEPENDENT_AMBULATORY_CARE_PROVIDER_SITE_OTHER): Payer: 59 | Admitting: Medical

## 2016-09-10 VITALS — BP 119/51 | HR 87 | Temp 98.2°F | Resp 16 | Ht 59.0 in | Wt 113.2 lb

## 2016-09-10 DIAGNOSIS — J301 Allergic rhinitis due to pollen: Secondary | ICD-10-CM

## 2016-09-10 DIAGNOSIS — R0981 Nasal congestion: Secondary | ICD-10-CM | POA: Diagnosis not present

## 2016-09-10 DIAGNOSIS — J0100 Acute maxillary sinusitis, unspecified: Secondary | ICD-10-CM | POA: Diagnosis not present

## 2016-09-10 DIAGNOSIS — J324 Chronic pansinusitis: Secondary | ICD-10-CM

## 2016-09-10 DIAGNOSIS — R062 Wheezing: Secondary | ICD-10-CM | POA: Diagnosis not present

## 2016-09-10 MED ORDER — FLUTICASONE PROPIONATE 50 MCG/ACT NA SUSP: 2.0000 | Freq: Every day | NASAL | 1 refills | Status: DC

## 2016-09-10 MED ORDER — AZITHROMYCIN 250 MG PO TABS: ORAL_TABLET | ORAL | 0 refills | Status: DC

## 2016-09-10 MED ORDER — LEVOCETIRIZINE DIHYDROCHLORIDE 5 MG PO TABS: 5.0000 mg | ORAL_TABLET | Freq: Every evening | ORAL | 0 refills | Status: DC

## 2016-09-10 NOTE — Patient Instructions (Signed)
For allergic rhinitis symptoms and nasal congestion will rx xyzal tabs and flonase nasal spray.  For sinus pressure/infection rx azithromycin.   For rare wheeze recently offered albuterol but declined due to prior side effect. If wheezing worsens let us know.  Follow up 7 days or as needed

## 2016-09-10 NOTE — Progress Notes (Signed)
Subjective:    Patient ID: [name], female    DOB: [birth year], 51 y.o.   MRN: [id number]  HPI  Pt in for some recent ear pressure, facial pressure, productive cough, and nasal congestion. Also states some dry cough and pnd one day after working out outdoors when pollen count was high.  Pt has rt side sinus pressure recently as well.   Pt had faint minimal wheeze earlier today.  Pt states even in march she was mild congested. Conservative measures advised. Prior to that was on azithromycin.    Review of Systems  Constitutional: Negative for chills, fatigue and fever.  HENT: Positive for congestion, ear pain, postnasal drip, sinus pain and sinus pressure.   Eyes: Negative for pain.  Respiratory: Positive for cough and wheezing. Negative for chest tightness.   Cardiovascular: Negative for chest pain and palpitations.  Gastrointestinal: Negative for abdominal pain.  Musculoskeletal: Negative for back pain and myalgias.  Neurological: Negative for dizziness, speech difficulty, weakness, numbness and headaches.  Hematological: Negative for adenopathy. Does not bruise/bleed easily.  Psychiatric/Behavioral: Negative for behavioral problems, confusion and hallucinations. The patient is not nervous/anxious.     Past Medical History:  Diagnosis Date  . Allergic rhinitis   . Anemia   . Anxiety   . Asthma   . Contact dermatitis and eczema   . Diverticulitis    CT Scan  . Esophageal reflux   . Hyperlipidemia, mixed   . IBS (irritable bowel syndrome)   . IC (interstitial cystitis)   . Internal hemorrhoids   . Lumbago   . Mitral valve disorders(424.0)   . Osteoarthritis 07/08/2015  . Post concussive syndrome      Social History   Social History  . Marital status: Married    Spouse name: N/A  . Number of children: 2  . Years of education: Masters   Occupational History  . [employer]    Social History Main Topics  . Smoking status: Never  Smoker  . Smokeless tobacco: Never Used  . Alcohol use Yes     Comment: Holidays only  . Drug use: No  . 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 Not on file   Social History Narrative   Lives at home home with husband and son.   Right-handed.   1 cup caffeine daily.    Past Surgical History:  Procedure Laterality Date  . BARTHOLIN CYST MARSUPIALIZATION N/A 12/18/2013   Procedure: BARTHOLIN CYST MARSUPIALIZATION WITH BIOPSY;  Surgeon: Lovenia Kim, MD;  Location: [facility];  Service: Gynecology;  Laterality: N/A;  . CESAREAN SECTION    . DILITATION & CURRETTAGE/HYSTROSCOPY WITH NOVASURE ABLATION N/A 10/14/2012   Procedure: DILATATION & CURETTAGE/HYSTEROSCOPY WITH NOVASURE ABLATION;  Surgeon: Lovenia Kim, MD;  Location: [facility];  Service: Gynecology;  Laterality: N/A;  . MOUTH SURGERY    . SINUS SURGERY WITH INSTATRAK      Family History  Problem Relation Age of Onset  . Colon polyps Sister   . Mental illness Sister        anxiety from 9/11 survivors  . Varicose Veins Sister   . Diabetes Mother   . Hypertension Mother   . Heart attack Mother        MI at age 47  . Alcohol abuse Mother        smoker  .  Heart disease Mother   . Dementia Mother   . Hypertension Father   . Hyperlipidemia Father   . Heart attack Father   . Cancer Father        lung cancer, smoker  . Cancer Brother        acute leukemia  . Dementia Maternal Grandmother   . Heart disease Maternal Grandfather        mi  . Cancer Paternal Grandmother        GYN cancer  . Other Sister        hypoglycemia  . Varicose Veins Sister   . Asthma Sister        rheumatoid  . Fibromyalgia Sister   . Mental retardation Sister        depression  . GER disease Son   . Alcohol abuse Unknown        Family history  . Colon cancer Neg Hx     Allergies  Allergen Reactions  . Aspirin Shortness Of Breath     "irritated stomach" Ibuprofen and Naproxen do not agree with her either  . Latex Shortness Of Breath and Itching  . Other Shortness Of Breath    peanuts  . Sulfamethoxazole-Trimethoprim Shortness Of Breath  . Sulfonamide Derivatives Shortness Of Breath and Itching  . Sulphadimidine Sodium [Sulfamethazine Sodium] Shortness Of Breath    Added to food for preservative  . Amoxicillin     Doesn't remember rxn  . Azithromycin Other (See Comments)    Faintness  . Carisoprodol   . Ciprofloxacin     REACTION: SOB, "Tightness in head"  . Clarithromycin     "Doesn't agree with me"  . Codeine     Does not take percocet or hydrocodone; tylenol only  . Epinephrine     Heart racing  . Metronidazole Other (See Comments)    "cannot tolerate"  . Nsaids   . Penicillins     Doesn't remember reaction    Current Outpatient Prescriptions on File Prior to Visit  Medication Sig Dispense Refill  . fluticasone (FLONASE) 50 MCG/ACT nasal spray Place 2 sprays into both nostrils daily. (Patient not taking: Reported on 09/10/2016) 16 g 6   No current facility-administered medications on file prior to visit.     BP (!) 119/51 (BP Location: Right Arm, Patient Position: Sitting, Cuff Size: Normal)   Pulse 87   Temp 98.2 F (36.8 C) (Oral)   Resp 16   Ht 4\' 11"  (1.499 m)   Wt 113 lb 3.2 oz (51.3 kg)   SpO2 100%   BMI 22.86 kg/m       Objective:   Physical Exam  General  Mental Status - Alert. General Appearance - Well groomed. Not in acute distress.  Skin Rashes- No Rashes.  HEENT Head- Normal. Ear Auditory Canal - Left- Normal. Right - some wax in canal.Tympanic Membrane- Left- Normal. Right- Normal. Eye Sclera/Conjunctiva- Left- Normal. Right- Normal. Nose & Sinuses Nasal Mucosa- Left-  Boggy and Congested. Right-  Boggy and  Congested.Bilateral maxillary and frontal sinus pressure. Mouth & Throat Lips: Upper Lip- Normal: no dryness, cracking, pallor, cyanosis, or vesicular eruption.  Lower Lip-Normal: no dryness, cracking, pallor, cyanosis or vesicular eruption. Buccal Mucosa- Bilateral- No Aphthous ulcers. Oropharynx- No Discharge or Erythema. Tonsils: Characteristics- Bilateral- No Erythema or Congestion. Size/Enlargement- Bilateral- No enlargement. Discharge- bilateral-None.  Neck Neck- Supple. No Masses.   Chest and Lung Exam Auscultation: Breath Sounds:-Clear even and unlabored.  Cardiovascular Auscultation:Rythm- Regular, rate and rhythm. Murmurs &  Other Heart Sounds:Ausculatation of the heart reveal- No Murmurs.  Lymphatic Head & Neck General Head & Neck Lymphatics: Bilateral: Description- No Localized lymphadenopathy.       Assessment & Plan:  For allergic rhinitis symptoms and nasal congestion will rx xyzal tabs and flonase nasal spray.  For sinus pressure/infection rx azithromycin.   For rare wheeze recently offered albuterol but declined due to prior side effect. If wheezing worsens let us know.  Follow up 7 days or as needed  Elmond Poehlman, Percell Miller, PA-C   Not discussion on pt rare one second chest pain in past. None current. Prior ekg negative and negative cardiologist eval. Explained today no ekg necessary but if she has pain in future be seen and be evaluated. Pt expressed understanding.

## 2016-09-11 ENCOUNTER — Telehealth: Payer: Self-pay | Admitting: Internal Medicine

## 2016-09-11 NOTE — Telephone Encounter (Signed)
Called and spoke to pt. Informed her of that CY has left for the day and he will be able to address her concern on Monday 5/21. Pt verbalized understanding and denied any further questions or concerns at this time.

## 2016-09-11 NOTE — Telephone Encounter (Signed)
Called and spoke with pt and she stated that she was seen by her PCP and given abx and told to take an antihistamine.  She stated that CY had given her a sample of an inhaler in the past and she cannot find this inhaler now.  She stated that she trusts CY as he knows how sensitive to meds that she is.  CY are you willing to give her a sample of an inhaler or call one in for her.  PLease advise. Thanks   Last seen 08/01/15 Next ov--12/07/16   Allergies  Allergen Reactions  . Aspirin Shortness Of Breath    "irritated stomach" Ibuprofen and Naproxen do not agree with her either  . Latex Shortness Of Breath and Itching  . Other Shortness Of Breath    peanuts  . Sulfamethoxazole-Trimethoprim Shortness Of Breath  . Sulfonamide Derivatives Shortness Of Breath and Itching  . Sulphadimidine Sodium [Sulfamethazine Sodium] Shortness Of Breath    Added to food for preservative  . Amoxicillin     Doesn't remember rxn  . Azithromycin Other (See Comments)    Faintness  . Carisoprodol   . Ciprofloxacin     REACTION: SOB, "Tightness in head"  . Clarithromycin     "Doesn't agree with me"  . Codeine     Does not take percocet or hydrocodone; tylenol only  . Epinephrine     Heart racing  . Metronidazole Other (See Comments)    "cannot tolerate"  . Nsaids   . Penicillins     Doesn't remember reaction

## 2016-09-14 MED ORDER — BECLOMETHASONE DIPROPIONATE 80 MCG/ACT NA AERS: 1.0000 | INHALATION_SPRAY | Freq: Every day | NASAL | 12 refills | Status: DC

## 2016-09-14 NOTE — Telephone Encounter (Signed)
Patient called back- pt uses [facility] [address] - she also wants to ensure med is called in as a generic. She can be reached at [phone] -pr

## 2016-09-14 NOTE — Telephone Encounter (Signed)
If she is asking about a nasal spray, we had her using Flonase/ fluticasone for allergy symptoms.  If she means an inhaler for her chest, offer albuterol HFA, # 1,  And suggest she only inhale 1 puff every 6 hours if needed.

## 2016-09-14 NOTE — Telephone Encounter (Signed)
LMTCB

## 2016-09-14 NOTE — Telephone Encounter (Signed)
Left detailed message on verified voicemail letting her know that her Qnasl (generic) rx been been sent to the pharmacy requested. Nothing further needed.

## 2016-09-14 NOTE — Telephone Encounter (Signed)
Pt is requesting the nasal inhaler - Qnasl which was given to her back in 2014.  Please advise Dr Annamaria Boots with instructions. Thanks.

## 2016-09-14 NOTE — Telephone Encounter (Signed)
Ok to order Qnasl nasal spray,   1-2 puffs each nostril once daily    Refill x 12

## 2016-11-03 DIAGNOSIS — J302 Other seasonal allergic rhinitis: Secondary | ICD-10-CM | POA: Diagnosis not present

## 2016-11-03 DIAGNOSIS — M26621 Arthralgia of right temporomandibular joint: Secondary | ICD-10-CM | POA: Diagnosis not present

## 2016-11-03 DIAGNOSIS — H6983 Other specified disorders of Eustachian tube, bilateral: Secondary | ICD-10-CM | POA: Diagnosis not present

## 2016-12-07 ENCOUNTER — Encounter: Payer: Self-pay | Admitting: Internal Medicine

## 2016-12-07 ENCOUNTER — Ambulatory Visit (INDEPENDENT_AMBULATORY_CARE_PROVIDER_SITE_OTHER): Payer: 59 | Admitting: Internal Medicine

## 2016-12-07 VITALS — BP 110/70 | HR 69 | Ht 59.0 in | Wt 111.2 lb

## 2016-12-07 DIAGNOSIS — R0981 Nasal congestion: Secondary | ICD-10-CM | POA: Diagnosis not present

## 2016-12-07 DIAGNOSIS — H6980 Other specified disorders of Eustachian tube, unspecified ear: Secondary | ICD-10-CM | POA: Diagnosis not present

## 2016-12-07 DIAGNOSIS — J4521 Mild intermittent asthma with (acute) exacerbation: Secondary | ICD-10-CM | POA: Diagnosis not present

## 2016-12-07 DIAGNOSIS — J45901 Unspecified asthma with (acute) exacerbation: Secondary | ICD-10-CM

## 2016-12-07 MED ORDER — METHYLPREDNISOLONE ACETATE 80 MG/ML IJ SUSP
80.0000 mg | Freq: Once | INTRAMUSCULAR | Status: AC
  Administered 2016-12-07: 40 mg via INTRAMUSCULAR

## 2016-12-07 MED ORDER — METHYLPREDNISOLONE ACETATE 40 MG/ML IJ SUSP: 40.0000 mg | Freq: Once | INTRAMUSCULAR | Status: DC

## 2016-12-07 MED ORDER — PHENYLEPHRINE HCL 1 % NA SOLN
3.0000 [drp] | Freq: Once | NASAL | Status: AC
  Administered 2016-12-07: 3 [drp] via NASAL

## 2016-12-07 NOTE — Progress Notes (Signed)
Patient ID: [name], female    DOB: 1966/02/17, 51 y.o.   MRN: [id number]  HPI female never smoker followed for allergic rhinitis, asthma. Complicated by History anxiety Office Spirometry 08/01/2015-within normal. FVC 3.02/112%, FEV1 2.36/104%, FEV1/FVC 0.78, FEF 25-75 percent 2.26/84%. PFT 11/26/2010 within normal limits within significant response to bronchodilator. FEV1/FVC 0.79  --------------------------------------------------------------------------------  08/01/2015-51 year old female never smoker followed for allergic rhinitis, asthma. History anxiety FOLLOWS FOR: Pt states she is getting over the flu recently; continues to have heaviness in chest-went to Cardiology-scheduled to do Echo. Unsure if related to asthma or allergies. Complains still of a heavy feeling on chest but little cough and no wheeze, no sputum, no pain. Has echocardiogram scheduled by cardiology Office Spirometry 08/01/2015-within normal. FVC 3.02/112%, FEV1 2.36/104%, FEV1/FVC 0.78, FEF 25-75 percent 2.26/84%.  12/07/16- 51 year old female never smoker followed for allergic rhinitis, asthma. History anxiety FOLLOWS FOR: Pt states she is having increased heaviness with slight SOB, Left ear feels clogged-ENT gave clear bill of health.  Complains of 6 months of intermittent discomfort with left ear pressure, nasal congestion, drainage, some dry cough and chest tightness. Denies fever, wheeze, purulent discharge, adenopathy. Has had a lot of dental work on a left upper molar.  Review of Systems-see HPI     + = pos Constitutional:   No-   weight loss, night sweats, fevers, chills, fatigue, lassitude. HEENT:    headaches, difficulty swallowing, tooth/dental problems, sore throat,       No-  sneezing, itching, +ear ache, +nasal congestion, +post nasal drip,  CV:  No-   chest pain, orthopnea, PND, swelling in lower extremities, anasarca, dizziness, palpitations Resp: shortness of breath with exertion or at  rest.                non-productive cough,  No- coughing up of blood.                 change in color of mucus.  No recent  wheezing.   Skin: No-   rash or lesions. GI:  No-   heartburn, indigestion, abdominal pain, nausea, vomiting, GU:  MS:  No-   joint pain or swelling.   Neuro-     nothing unusual Psych:  No- change in mood or affect. No depression + anxiety.  No memory loss.  Objective:   Physical Exam General- Alert, Oriented, Affect-anxious/ talkative, Distress- none acute, trim, petite woman    Skin- rash-none, lesions- none, excoriation- none Lymphadenopathy- none Head- atraumatic            Eyes- Gross vision intact, PERRLA, conjunctivae clear secretions            Ears- Hearing, canals normal. +Scarring or sclerosis TMs            Nose- +Narrow with turbinate edema, clear mucus bridging, No- Septal dev, polyps, erosion, perforation             Throat- Mallampati III , mucosa clear , drainage- none, tonsils- atrophic Neck- flexible , trachea midline, no stridor , thyroid nl, carotid no bruit Chest - symmetrical excursion , unlabored           Heart/CV- RRR , no murmur , no gallop  , no rub, nl s1 s2                           - JVD- none , edema- none, stasis changes- none, varices- none  Lung- clear to P&A, wheeze- none, cough- none , dullness-none, rub- none           Chest wall-  Abd-  Br/ Gen/ Rectal- Not done, not indicated Extrem- cyanosis- none, clubbing, none, atrophy- none, strength- nl Neuro- grossly intact to observation

## 2016-12-07 NOTE — Patient Instructions (Signed)
Order- neb neo nasal      Dx exacerbation allergic rhinitis,  Eustachian dysfunction              Depo 40  Please call if we can help

## 2016-12-08 NOTE — Assessment & Plan Note (Signed)
Persistent rhinitis with eustachian dysfunction. Uncertain relationship to her left upper molar dental work. Has already seen ENT. I don't think she needs an antibiotic but we may come back to that. Plan-nasal decongestant nebulizer treatment, Depo-Medrol.

## 2016-12-08 NOTE — Assessment & Plan Note (Signed)
Mild dry cough and chest tightness without wheeze might still be a minor exacerbation of her asthma pattern. Plan-Depo-Medrol. If this persists we will look at inhalers.

## 2016-12-17 ENCOUNTER — Ambulatory Visit: Payer: 59 | Admitting: Family Medicine

## 2016-12-24 ENCOUNTER — Ambulatory Visit (INDEPENDENT_AMBULATORY_CARE_PROVIDER_SITE_OTHER): Payer: 59 | Admitting: Family Medicine

## 2016-12-24 VITALS — BP 116/78 | HR 75 | Temp 98.7°F | Ht 59.0 in | Wt 110.4 lb

## 2016-12-24 DIAGNOSIS — Z23 Encounter for immunization: Secondary | ICD-10-CM | POA: Diagnosis not present

## 2016-12-24 DIAGNOSIS — R0789 Other chest pain: Secondary | ICD-10-CM

## 2016-12-24 DIAGNOSIS — F411 Generalized anxiety disorder: Secondary | ICD-10-CM | POA: Diagnosis not present

## 2016-12-24 MED ORDER — ALPRAZOLAM 0.25 MG PO TABS: ORAL_TABLET | ORAL | 0 refills | Status: DC
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
## 2016-12-24 NOTE — Patient Instructions (Addendum)
Let's have you use the xanax as needed for severe anxiety This medication can be habit forming- use it only as needed, and remember it can make you a bit sleepy.  If you find that you are reaching for the xanax frequently I would suggest that we start on a medication such as zoloft to help PREVENT your anxiety. I think this sort of medication may really help you!     Try the nasacort and Xyzal as needed for allergies - I think this may help with your scratchy throat and feeling of needing to spit frequently Please see your neurologist about your concerns regarding persistent concussion symptoms

## 2017-01-12 ENCOUNTER — Telehealth: Payer: Self-pay | Admitting: *Deleted

## 2017-01-12 NOTE — Telephone Encounter (Signed)
Received Disability and Leave paperwork from [facility], Dept of [place], completed as much as possible and attached last OV note as patient had numerous new and old complaints; forwarded to provider/SLS 09/18

## 2017-01-13 DIAGNOSIS — H04123 Dry eye syndrome of bilateral lacrimal glands: Secondary | ICD-10-CM | POA: Diagnosis not present

## 2017-01-19 NOTE — Telephone Encounter (Signed)
Patient states she missed PCP call and she wanted to inform PCP she would like intermittent leave due to concussion.

## 2017-01-20 NOTE — Telephone Encounter (Signed)
Spoke with pt- she may need up to 5 hours per week.  She does sometimes still need to leave work early due to fatigue and other concussion sx

## 2017-01-25 ENCOUNTER — Telehealth: Payer: Self-pay | Admitting: Family Medicine

## 2017-01-25 DIAGNOSIS — M7989 Other specified soft tissue disorders: Secondary | ICD-10-CM

## 2017-01-25 DIAGNOSIS — R0789 Other chest pain: Secondary | ICD-10-CM

## 2017-01-25 NOTE — Telephone Encounter (Signed)
Pt called with two requests. First: please make corrections to FMLA forms that [employer] filled out and resend them. Corrections are on page 2, question #3 and #4 need to say "unplanned flare ups",  Page 3, question #6 ":yes (f/u appts required)". Please make corrections and resend FMLA forms. Second request: pt having Left leg pain & burning and feels knots or hard spots on veins. Pt request [place] bilateral legs. Call pt [phone].

## 2017-01-27 NOTE — Telephone Encounter (Signed)
Tried to contact pt to get more information about her leg pain. No answer, left message for pt to return call.

## 2017-01-27 NOTE — Telephone Encounter (Signed)
Here is the message from pt who wants me to change her FMLA forms Norvel Richards can you please give her a call and ask her about the issue with her lbs

## 2017-01-27 NOTE — Telephone Encounter (Signed)
Information added, Dr. Lorelei Pont approved; forms resent via fax/SLS 10/03

## 2017-01-27 NOTE — Telephone Encounter (Signed)
Pt returned call. Pt states that she has throbbing pain and a lump felt in the back of her left leg x 1-2 weeks. Also c/o the back of her left arm feeling achy. Pt states that she would like an u/s of her left leg to make sure there is no blood clot. Informed pt that provider has placed order for left leg u/s.  Pt also wanted to f/u on stress test referral. Order was placed but pt has not been called yet. Provider placed another order for stress test.   Also informed pt that her FMLA paperwork is being worked on and will be finished soon. Pt verbalized understanding.

## 2017-01-27 NOTE — Telephone Encounter (Signed)
[name] spoke with pt- I will re-order treadmill and also ordered left leg [place] We updated her FMLA paperwork as requested

## 2017-01-30 ENCOUNTER — Ambulatory Visit (HOSPITAL_BASED_OUTPATIENT_CLINIC_OR_DEPARTMENT_OTHER)
Admission: RE | Admit: 2017-01-30 | Discharge: 2017-01-30 | Disposition: A | Discharge status: Home / Self Care | Payer: 59 | Source: Ambulatory Visit | Attending: Family Medicine | Admitting: Family Medicine

## 2017-01-30 DIAGNOSIS — M25561 Pain in right knee: Secondary | ICD-10-CM | POA: Diagnosis not present

## 2017-01-30 DIAGNOSIS — M7989 Other specified soft tissue disorders: Secondary | ICD-10-CM | POA: Insufficient documentation

## 2017-01-31 ENCOUNTER — Encounter: Payer: Self-pay | Admitting: Family Medicine

## 2017-02-12 ENCOUNTER — Other Ambulatory Visit: Payer: Self-pay | Admitting: Obstetrics and Gynecology

## 2017-02-12 DIAGNOSIS — Z139 Encounter for screening, unspecified: Secondary | ICD-10-CM

## 2017-02-23 ENCOUNTER — Ambulatory Visit (INDEPENDENT_AMBULATORY_CARE_PROVIDER_SITE_OTHER): Payer: 59

## 2017-02-23 DIAGNOSIS — R0789 Other chest pain: Secondary | ICD-10-CM

## 2017-02-24 LAB — EXERCISE TOLERANCE TEST
CHL CUP MPHR: 169 {beats}/min
CHL RATE OF PERCEIVED EXERTION: 17
CSEPHR: 90 %
Estimated workload: 10.1 METS
Exercise duration (min): 9 min
Exercise duration (sec): 0 s
Peak HR: 153 {beats}/min
Rest HR: 77 {beats}/min

## 2017-02-26 ENCOUNTER — Telehealth: Payer: Self-pay | Admitting: Family Medicine

## 2017-02-26 NOTE — Telephone Encounter (Signed)
Called pt and gave her results from her recent stress- all ok.  She is pleased to hear this   Blood pressure demonstrated a hypertensive response to exercise.  No T wave inversion was noted during stress.  There was no ST segment deviation noted during stress.  Overall, the patient's exercise capacity was normal.  [facility] Treadmill Score: low risk   Negative stress test without evidence of ischemia at given workload.

## 2017-03-10 ENCOUNTER — Ambulatory Visit
Admission: RE | Admit: 2017-03-10 | Discharge: 2017-03-10 | Disposition: A | Discharge status: Home / Self Care | Payer: 59 | Source: Ambulatory Visit | Attending: Obstetrics and Gynecology | Admitting: Obstetrics and Gynecology

## 2017-03-10 DIAGNOSIS — Z139 Encounter for screening, unspecified: Secondary | ICD-10-CM

## 2017-03-10 DIAGNOSIS — Z1231 Encounter for screening mammogram for malignant neoplasm of breast: Secondary | ICD-10-CM | POA: Diagnosis not present

## 2017-03-16 ENCOUNTER — Telehealth: Payer: Self-pay | Admitting: Family Medicine

## 2017-03-16 ENCOUNTER — Ambulatory Visit: Payer: 59 | Admitting: Family Medicine

## 2017-03-16 DIAGNOSIS — I059 Rheumatic mitral valve disease, unspecified: Secondary | ICD-10-CM

## 2017-03-16 NOTE — Telephone Encounter (Signed)
Copied from [place] [id number]. Topic: Quick Communication - See Telephone Encounter >> Mar 16, 2017 12:38 PM Antonieta Iba C wrote: CRM for notification. See Telephone encounter for:  03/16/17.    Pt called in, she said that she went to the dentist and they suggested that she have a echocardiogram for a previous condition. Pt would like to have the orders placed for imaging downstairs.

## 2017-03-17 ENCOUNTER — Encounter: Payer: Self-pay | Admitting: Family Medicine

## 2017-03-17 DIAGNOSIS — J029 Acute pharyngitis, unspecified: Secondary | ICD-10-CM | POA: Diagnosis not present

## 2017-03-17 DIAGNOSIS — R1032 Left lower quadrant pain: Secondary | ICD-10-CM | POA: Diagnosis not present

## 2017-03-17 NOTE — Telephone Encounter (Signed)
Taken care of

## 2017-03-23 ENCOUNTER — Ambulatory Visit (HOSPITAL_BASED_OUTPATIENT_CLINIC_OR_DEPARTMENT_OTHER): Payer: 59

## 2017-04-06 ENCOUNTER — Other Ambulatory Visit: Payer: Self-pay | Admitting: Family Medicine

## 2017-04-07 DIAGNOSIS — R1032 Left lower quadrant pain: Secondary | ICD-10-CM | POA: Diagnosis not present

## 2017-04-09 ENCOUNTER — Other Ambulatory Visit: Payer: Self-pay

## 2017-04-09 DIAGNOSIS — J324 Chronic pansinusitis: Secondary | ICD-10-CM

## 2017-04-09 MED ORDER — FLUTICASONE PROPIONATE 50 MCG/ACT NA SUSP: 2.0000 | Freq: Every day | NASAL | 1 refills | Status: DC

## 2017-04-13 ENCOUNTER — Ambulatory Visit (HOSPITAL_BASED_OUTPATIENT_CLINIC_OR_DEPARTMENT_OTHER): Payer: 59

## 2017-04-21 DIAGNOSIS — Z6821 Body mass index (BMI) 21.0-21.9, adult: Secondary | ICD-10-CM | POA: Diagnosis not present

## 2017-04-21 DIAGNOSIS — Z01419 Encounter for gynecological examination (general) (routine) without abnormal findings: Secondary | ICD-10-CM | POA: Diagnosis not present

## 2017-04-30 ENCOUNTER — Encounter: Payer: Self-pay | Admitting: Family Medicine

## 2017-05-05 ENCOUNTER — Ambulatory Visit (HOSPITAL_BASED_OUTPATIENT_CLINIC_OR_DEPARTMENT_OTHER): Payer: 59

## 2017-06-22 DIAGNOSIS — K5904 Chronic idiopathic constipation: Secondary | ICD-10-CM | POA: Diagnosis not present

## 2017-06-22 DIAGNOSIS — R1032 Left lower quadrant pain: Secondary | ICD-10-CM | POA: Diagnosis not present

## 2017-06-22 DIAGNOSIS — K219 Gastro-esophageal reflux disease without esophagitis: Secondary | ICD-10-CM | POA: Diagnosis not present

## 2017-06-24 ENCOUNTER — Ambulatory Visit (INDEPENDENT_AMBULATORY_CARE_PROVIDER_SITE_OTHER): Payer: 59 | Admitting: Medical

## 2017-06-24 ENCOUNTER — Encounter: Payer: Self-pay | Admitting: Medical

## 2017-06-24 VITALS — BP 124/70 | HR 92 | Temp 98.0°F | Resp 16 | Ht 59.0 in | Wt 108.0 lb

## 2017-06-24 DIAGNOSIS — R5383 Other fatigue: Secondary | ICD-10-CM | POA: Diagnosis not present

## 2017-06-24 DIAGNOSIS — N924 Excessive bleeding in the premenopausal period: Secondary | ICD-10-CM | POA: Diagnosis not present

## 2017-06-24 DIAGNOSIS — M79601 Pain in right arm: Secondary | ICD-10-CM

## 2017-06-24 DIAGNOSIS — R109 Unspecified abdominal pain: Secondary | ICD-10-CM

## 2017-06-24 DIAGNOSIS — R319 Hematuria, unspecified: Secondary | ICD-10-CM

## 2017-06-24 DIAGNOSIS — K589 Irritable bowel syndrome without diarrhea: Secondary | ICD-10-CM

## 2017-06-24 LAB — POC URINALSYSI DIPSTICK (AUTOMATED)
BILIRUBIN UA: NEGATIVE
GLUCOSE UA: NEGATIVE
Ketones, UA: NEGATIVE
Leukocytes, UA: NEGATIVE
NITRITE UA: NEGATIVE
Protein, UA: NEGATIVE
Spec Grav, UA: 1.015 (ref 1.010–1.025)
Urobilinogen, UA: 0.2 E.U./dL
pH, UA: 6 (ref 5.0–8.0)

## 2017-06-24 NOTE — Patient Instructions (Signed)
The GI doctor recently appears to think you have underlying IBS.  I would take the probiotic as he recommended.  I would also recommend that she start the Zantac and the Linzess.  We discussed the timing of starting the medication and you can start that after you finish a azithromycin for your probable sinus infection.  Your EKG done today looked normal..(Normal sinus rhythm.)  Done due to recent abdomen pain and right arm pain.  Right arm pain is resolved and was very transient.  If it does come back and is constant would recommend x-ray of the right humerus.  The order was already placed.  For recent right upper quadrant pain today, I want you to get labs done tomorrow.  His labs will include CBC, CMP, amylase, lipase and H. pylori breath test.  If you do have recurrent right upper quadrant pain then would order abdomen ultrasound.  Will get urine culture to evaluate the blood in the urine.  Would ask that you get your urologist to fax over records from previous cystoscopy/workup.  Follow-up date to be determined after lab review.

## 2017-06-24 NOTE — Progress Notes (Addendum)
Subjective:    Patient ID: [name], female    DOB: 1965-12-31, 52 y.o.   MRN: [id number]  HPI   Pt in states she has some abdomen pain history. Pt saw a GI MD and they gave her linzess recently. Around time she went to GI she had flare of hemorrhoids.Pt states adbomen cramping and scattered areas of pain at times. Pt has not tried linzess. Nor did she try zantac. She did try probiotic and it did help. Pt has had colonoscopy and she states was normal.  Occasionally pt has transient intermittent back pain. This is occasional/rare.She wants to know if it is kidney stone. Pt does not smoke. No hx and no second hand smoke. Hx of blood in urine. Pt had cystoscopy in past and study was normal one year ago. Dr. Avanell Shackleton urologist did procedure.  Pt had been seeing gyn. And her fibroids have been shrinking.  Pt had some rt arm pain yesterday while driving. Pain last few seconds and then went away. No other symptoms with that. No cardiac or neurologic signs or symptoms.  Also weeks of sinus pressure and some colored nasal drainage. Other provider she saw in past month gave her zpack.She has not been taking antibiotic hoping to get better. She also tried flonase.    Review of Systems  Constitutional: Negative for chills, fatigue and fever.  HENT: Positive for sinus pressure and sinus pain. Negative for congestion, drooling, ear discharge, ear pain and sore throat.   Respiratory: Negative for cough, chest tightness, shortness of breath and wheezing.   Cardiovascular: Negative for chest pain and palpitations.  Gastrointestinal: Negative for abdominal pain, diarrhea, nausea and vomiting.       Some rt upper quadrant pain earlier today.   See hpi.  Genitourinary: Positive for hematuria. Negative for difficulty urinating, dysuria, flank pain and frequency.       By ua study.  Musculoskeletal: Positive for back pain. Negative for arthralgias, myalgias and neck stiffness.  Skin: Negative for  rash.  Hematological: Negative for adenopathy. Does not bruise/bleed easily.  Psychiatric/Behavioral: Negative for behavioral problems, confusion and decreased concentration.     Past Medical History:  Diagnosis Date  . Allergic rhinitis   . Anemia   . Anxiety   . Asthma   . Contact dermatitis and eczema   . Diverticulitis    CT Scan  . Esophageal reflux   . Hyperlipidemia, mixed   . IBS (irritable bowel syndrome)   . IC (interstitial cystitis)   . Internal hemorrhoids   . Lumbago   . Mitral valve disorders(424.0)   . Osteoarthritis 07/08/2015  . Post concussive syndrome      Social History   Socioeconomic History  . Marital status: Married    Spouse name: Not on file  . Number of children: 2  . Years of education: Masters  . Highest education level: Not on file  Social Needs  . Financial resource strain: Not on file  . Food insecurity - worry: Not on file  . Food insecurity - inability: Not on file  . Transportation needs - medical: Not on file  . Transportation needs - non-medical: Not on file  Occupational History  . Occupation: [employer]  Tobacco Use  . Smoking status: Never Smoker  . Smokeless tobacco: Never Used  Substance and Sexual Activity  . Alcohol use: Yes    Comment: Holidays only  . Drug use: No  . Sexual activity: Yes    Comment:  lives with husband and son, work with [employer] as a Clinical research associate, aoivd gluten, dairy  Other Topics Concern  . Not on file  Social History Narrative   Lives at home home with husband and son.   Right-handed.   1 cup caffeine daily.    Past Surgical History:  Procedure Laterality Date  . BARTHOLIN CYST MARSUPIALIZATION N/A 12/18/2013   Procedure: BARTHOLIN CYST MARSUPIALIZATION WITH BIOPSY;  Surgeon: Lovenia Kim, MD;  Location: [facility];  Service: Gynecology;  Laterality: N/A;  . CESAREAN SECTION    . DILITATION & CURRETTAGE/HYSTROSCOPY WITH NOVASURE ABLATION N/A 10/14/2012   Procedure: DILATATION &  CURETTAGE/HYSTEROSCOPY WITH NOVASURE ABLATION;  Surgeon: Lovenia Kim, MD;  Location: [facility];  Service: Gynecology;  Laterality: N/A;  . MOUTH SURGERY    . SINUS SURGERY WITH INSTATRAK      Family History  Problem Relation Age of Onset  . Colon polyps Sister   . Mental illness Sister        anxiety from 9/11 survivors  . Varicose Veins Sister   . Diabetes Mother   . Hypertension Mother   . Heart attack Mother        MI at age 32  . Alcohol abuse Mother        smoker  . Heart disease Mother   . Dementia Mother   . Hypertension Father   . Hyperlipidemia Father   . Heart attack Father   . Cancer Father        lung cancer, smoker  . Cancer Brother        acute leukemia  . Dementia Maternal Grandmother   . Heart disease Maternal Grandfather        mi  . Cancer Paternal Grandmother        GYN cancer  . Other Sister        hypoglycemia  . Varicose Veins Sister   . Asthma Sister        rheumatoid  . Fibromyalgia Sister   . Mental retardation Sister        depression  . GER disease Son   . Alcohol abuse Unknown        Family history  . Colon cancer Neg Hx   . Breast cancer Neg Hx     Allergies  Allergen Reactions  . Aspirin Shortness Of Breath    "irritated stomach" Ibuprofen and Naproxen do not agree with her either  . Latex Shortness Of Breath and Itching  . Other Shortness Of Breath    peanuts  . Sulfamethoxazole-Trimethoprim Shortness Of Breath  . Sulfonamide Derivatives Shortness Of Breath and Itching  . Sulphadimidine Sodium [Sulfamethazine Sodium] Shortness Of Breath    Added to food for preservative  . Amoxicillin     Doesn't remember rxn  . Azithromycin Other (See Comments)    Faintness  . Carisoprodol   . Ciprofloxacin     REACTION: SOB, "Tightness in head"  . Clarithromycin     "Doesn't agree with me"  . Codeine     Does not take percocet or hydrocodone; tylenol only  . Epinephrine     Heart racing  . Metronidazole Other (See Comments)     "cannot tolerate"  . Nsaids   . Penicillins     Doesn't remember reaction    Current Outpatient Medications on File Prior to Visit  Medication Sig Dispense Refill  . ALPRAZolam (XANAX) 0.25 MG tablet Take 1/2 or 1 every 12 hours as needed for anxiety 20  tablet 0  . fluticasone (FLONASE) 50 MCG/ACT nasal spray Place 2 sprays into both nostrils daily. 16 g 1  . hyoscyamine (LEVSIN, ANASPAZ) 0.125 MG tablet Take by mouth.    . levocetirizine (XYZAL) 5 MG tablet Take 1 tablet (5 mg total) by mouth every evening. 30 tablet 0  . omeprazole (PRILOSEC) 40 MG capsule      No current facility-administered medications on file prior to visit.     BP 124/70   Pulse 92   Temp 98 F (36.7 C) (Oral)   Resp 16   Ht 4\' 11"  (1.499 m)   Wt 108 lb (49 kg)   SpO2 100%   BMI 21.81 kg/m       Objective:   Physical Exam  General Appearance- Not in acute distress.  HEENT Eyes- Scleraeral/Conjuntiva-bilat- Not Yellow. Mouth & Throat- Normal.  Chest and Lung Exam Auscultation: Breath sounds:-Normal. Adventitious sounds:- No Adventitious sounds.  Cardiovascular Auscultation:Rythm - Regular. Heart Sounds -Normal heart sounds.  Abdomen Inspection:-Inspection Normal.  Palpation/Perucssion: Palpation and Percussion of the abdomen reveal- very faint rt lower quadrant Tender, No Rebound tenderness, No rigidity(Guarding) and No Palpable abdominal masses.  Liver:-Normal.  Spleen:- Normal.   Back- no cva tenderness.  Rt upper ext- no pain on palpation. Rt shoulder- good range of motion.     Assessment & Plan:    The GI doctor recently appears to think you have underlying IBS.  I would take the probiotic as he recommended.  I would also recommend that she start the Zantac and the Linzess.  We discussed the timing of starting the medication and you can start that after you finish a azithromycin for your probable sinus infection.  Your EKG done today looked normal..(Normal sinus rhythm.)  Done  due to recent abdomen pain and right arm pain.  Right arm pain is resolved and was very transient.  If it does come back and is constant would recommend x-ray of the right humerus.  The order was already placed.  For recent right upper quadrant pain today, I want you to get labs done tomorrow.  His labs will include CBC, CMP, amylase, lipase and H. pylori breath test.  If you do have recurrent right upper quadrant pain then would order abdomen ultrasound.  Will get urine culture to evaluate the blood in the urine.  Would ask that you get your urologist to fax over records from previous cystoscopy/workup.  Follow-up date to be determined after lab review.   40 minutes time spent with patient.  50% of time spent counseling and discussing patient various conditions ranging from IBS, possible gallbladder disease, possibility of diverticulitis,  when to start recent medications GI doctor prescribed differential diagnosis of hematuria, because of right arm pain and treatment of sinus infection.  H pylori +. Dr.Copland did discuss with pharmacist. Best plan is below. Have to leave out flagyl due to her allergy history. 1. PPI at usual H pylori dose BID  2. Bismuth subsalicylate 300 QID  3. Doxycyline 100 BID    Mackie Pai, PA-C

## 2017-06-25 ENCOUNTER — Other Ambulatory Visit (INDEPENDENT_AMBULATORY_CARE_PROVIDER_SITE_OTHER): Payer: 59

## 2017-06-25 ENCOUNTER — Telehealth: Payer: Self-pay | Admitting: Cardiology

## 2017-06-25 DIAGNOSIS — R5383 Other fatigue: Secondary | ICD-10-CM

## 2017-06-25 DIAGNOSIS — N924 Excessive bleeding in the premenopausal period: Secondary | ICD-10-CM

## 2017-06-25 DIAGNOSIS — R109 Unspecified abdominal pain: Secondary | ICD-10-CM

## 2017-06-25 LAB — CBC WITH DIFFERENTIAL/PLATELET
BASOS ABS: 0 10*3/uL (ref 0.0–0.1)
Basophils Relative: 1 % (ref 0.0–3.0)
Eosinophils Absolute: 0.1 10*3/uL (ref 0.0–0.7)
Eosinophils Relative: 2.4 % (ref 0.0–5.0)
HCT: 41.1 % (ref 36.0–46.0)
HEMOGLOBIN: 14 g/dL (ref 12.0–15.0)
LYMPHS PCT: 35.4 % (ref 12.0–46.0)
Lymphs Abs: 1.6 10*3/uL (ref 0.7–4.0)
MCHC: 34.1 g/dL (ref 30.0–36.0)
MCV: 99.9 fl (ref 78.0–100.0)
MONO ABS: 0.4 10*3/uL (ref 0.1–1.0)
Monocytes Relative: 9.6 % (ref 3.0–12.0)
Neutro Abs: 2.3 10*3/uL (ref 1.4–7.7)
Neutrophils Relative %: 51.6 % (ref 43.0–77.0)
Platelets: 323 10*3/uL (ref 150.0–400.0)
RBC: 4.11 Mil/uL (ref 3.87–5.11)
RDW: 12.2 % (ref 11.5–15.5)
WBC: 4.4 10*3/uL (ref 4.0–10.5)

## 2017-06-25 LAB — COMPREHENSIVE METABOLIC PANEL
ALBUMIN: 3.8 g/dL (ref 3.5–5.2)
ALT: 13 U/L (ref 0–35)
AST: 18 U/L (ref 0–37)
Alkaline Phosphatase: 75 U/L (ref 39–117)
BILIRUBIN TOTAL: 0.9 mg/dL (ref 0.2–1.2)
BUN: 11 mg/dL (ref 6–23)
CHLORIDE: 102 meq/L (ref 96–112)
CO2: 30 mEq/L (ref 19–32)
CREATININE: 0.61 mg/dL (ref 0.40–1.20)
Calcium: 9.5 mg/dL (ref 8.4–10.5)
GFR: 109.4 mL/min (ref >60.00)
Glucose, Bld: 80 mg/dL (ref 70–99)
Potassium: 4 mEq/L (ref 3.5–5.1)
SODIUM: 139 meq/L (ref 135–145)
Total Protein: 7.4 g/dL (ref 6.0–8.3)

## 2017-06-25 LAB — AMYLASE: Amylase: 52 U/L (ref 27–131)

## 2017-06-25 LAB — TSH: TSH: 2.29 u[IU]/mL (ref 0.35–4.50)

## 2017-06-25 LAB — LIPASE: LIPASE: 24 U/L (ref 11.0–59.0)

## 2017-06-25 NOTE — Telephone Encounter (Signed)
Routing to Dr. Jacalyn Lefevre team.

## 2017-06-25 NOTE — Telephone Encounter (Signed)
New Message   Patient is calling about having a calcification test done. She would like for Dr.Crenshaw to order this for her. Please call to discuss.

## 2017-06-25 NOTE — Addendum Note (Signed)
Addended by: Caffie Pinto on: 06/25/2017 07:12 AM   Modules accepted: Orders

## 2017-06-26 LAB — URINE CULTURE
MICRO NUMBER:: 90268453
SPECIMEN QUALITY: ADEQUATE

## 2017-06-28 LAB — H. PYLORI BREATH TEST: H. PYLORI BREATH TEST: DETECTED — AB

## 2017-06-28 NOTE — Telephone Encounter (Signed)
Left message for pt to call.

## 2017-06-30 NOTE — Telephone Encounter (Signed)
Spoke with pt, questions regarding what the gxt, echo and calcium scoring results mean and how they determine the next step in treatment. Patient voiced understanding.

## 2017-06-30 NOTE — Telephone Encounter (Signed)
Mrs. Carranza is returning your call .  Thanks

## 2017-07-01 ENCOUNTER — Telehealth: Payer: Self-pay | Admitting: Medical

## 2017-07-01 ENCOUNTER — Telehealth: Payer: Self-pay | Admitting: Family Medicine

## 2017-07-01 MED ORDER — DOXYCYCLINE HYCLATE 100 MG PO TABS: 100.0000 mg | ORAL_TABLET | Freq: Two times a day (BID) | ORAL | 0 refills | Status: DC

## 2017-07-01 NOTE — Telephone Encounter (Signed)
Copied from [place] [id number]. Topic: Quick Communication - See Telephone Encounter >> Jul 01, 2017  7:58 AM Synthia Innocent wrote: CRM for notification. See Telephone encounter for:  Patient states that she needs to speak with nurse regarding possibly repeating GI test again since she had eaten before test. And would like to discuss treatment options for h pylori.  07/01/17.

## 2017-07-01 NOTE — Telephone Encounter (Signed)
Opened erroneous.

## 2017-07-01 NOTE — Telephone Encounter (Signed)
Opened to rx antibiotic.

## 2017-07-02 NOTE — Telephone Encounter (Signed)
Spoke with patient in detail about her dx and medications.   Dr. Lorelei Pont no need to call back

## 2017-07-07 ENCOUNTER — Ambulatory Visit (HOSPITAL_BASED_OUTPATIENT_CLINIC_OR_DEPARTMENT_OTHER)
Admission: RE | Admit: 2017-07-07 | Discharge: 2017-07-07 | Disposition: A | Discharge status: Home / Self Care | Payer: 59 | Source: Ambulatory Visit | Attending: Family Medicine | Admitting: Family Medicine

## 2017-07-07 DIAGNOSIS — E785 Hyperlipidemia, unspecified: Secondary | ICD-10-CM | POA: Insufficient documentation

## 2017-07-07 DIAGNOSIS — I059 Rheumatic mitral valve disease, unspecified: Secondary | ICD-10-CM | POA: Insufficient documentation

## 2017-07-07 NOTE — Progress Notes (Signed)
  Echocardiogram 2D Echocardiogram has been performed.  Joelene Millin 07/07/2017, 4:13 PM

## 2017-07-10 ENCOUNTER — Telehealth: Payer: Self-pay | Admitting: Family Medicine

## 2017-07-10 NOTE — Telephone Encounter (Signed)
Called her re recent echo.  It appears normal.  Will also forward to Dr. Stanford Breed who is her cardiologist

## 2017-07-12 ENCOUNTER — Telehealth: Payer: Self-pay | Admitting: Medical

## 2017-07-12 ENCOUNTER — Telehealth: Payer: Self-pay | Admitting: Internal Medicine

## 2017-07-12 ENCOUNTER — Ambulatory Visit: Payer: Self-pay | Admitting: *Deleted

## 2017-07-12 NOTE — Telephone Encounter (Signed)
See triage encounter.

## 2017-07-12 NOTE — Telephone Encounter (Signed)
Copied from [place]. Topic: Quick Communication - Rx Refill/Question >> Jul 12, 2017  5:33 PM Cecelia Byars, NT wrote: Medication: doxycycline (VIBRA-TABS) 100 MG tablet Has the patient contacted their pharmacy? {yes (Agent: If no, request that the patient contact the pharmacy for the refill.) Preferred Pharmacy (with phone number or street name): [facility], [address] [phone] (Phone) [phone] (Fax Agent: Please be advised that RX refills may take up to 3 business days. We ask that you follow-up with your pharmacy.

## 2017-07-12 NOTE — Telephone Encounter (Signed)
Patient states that her pcp referred her to Dr. Collene Mares. Patient says that she didn't realize that this would mean "transfer of care". She says that she wants to stay with Dr. Henrene Pastor. Records placed on his desk for review.

## 2017-07-12 NOTE — Telephone Encounter (Signed)
Pt states she experienced a dull pressure like feeling to the left lower quadrant today. Pt was treated for the same symptoms previously and was given and antibiotic.Pt states she  Finished taking the antibiotics on yesterday. Pt states she started back on Linzess today after being off the medication for 2 week due to treatment of abdominal pain with antibiotics.Pt states she did experience some relief of the pressure feeling after having a bowel movement today.Explained to pt that Linzess was used to treat constipation and with taking the medication she should continue to drink more water as she was told by the GI doctor to prevent constipation.  Pt states she has been taking extra virgin olive oil as well to treat current symptoms after reading online that it could be used as a home remedy to help treat the pressure feeling that she was experiencing in the left lower quadrant Pt initially contacted GI doctor and was told she would not be able to come in for an appt until May and is currently waiting on a return phone call.Offered to make appt for the pt to come in for appt tomorrow to be seen for current symptoms, but the pt states she wants to wait to see if she has improvement with drinking more water and starting back on Linzess. Pt offered to make appt with another provider to be seen sooner, but pt states she will rather see Dr. Lorelei Pont and requested a late appt due to work schedule. Appt scheduled for 3/28 with Dr. Lorelei Pont as requested by pt. Pt advised to call the office back if pain became worse or new symptoms developed. Pt verbalized understanding.  Reason for Disposition . Abdominal pain is a chronic symptom (recurrent or ongoing AND present > 4 weeks)  Protocols used: ABDOMINAL PAIN - [facility]

## 2017-07-14 ENCOUNTER — Telehealth: Payer: Self-pay

## 2017-07-14 NOTE — Telephone Encounter (Signed)
Copied from [place] [phone] [phone]. Topic: Referral - Request >> Jul 14, 2017  3:03 PM Carolyn Stare wrote:  Pt call to say she is still having left side  abdominal pain and is asking for a CT scan with contrast  . Would like a call back   [phone]

## 2017-07-14 NOTE — Telephone Encounter (Signed)
Called her back- she has noted a tender area and a feeling of a lump in her LLQ for several weeks, not changing or getting worse,  Explained that I will need to see her prior to ordering a CT scan. She has an appt for next week.  Offered to have her see UC or ER now- she feels ok waiting till next week but will contact me if any change or worsening in the meantime

## 2017-07-15 ENCOUNTER — Telehealth: Payer: Self-pay | Admitting: Family Medicine

## 2017-07-15 NOTE — Telephone Encounter (Signed)
Copied from [place] [id number]. Topic: General - Other >> Jul 15, 2017  4:26 PM Darl Householder, RMA wrote: Reason for CRM: Patient is requesting a call back concerning H-Pylori medication and she has a dentist appt and dentist wants to know if it is ok for her to have her teeth cleaned, please return pt call

## 2017-07-15 NOTE — Telephone Encounter (Signed)
Please advise 

## 2017-07-16 NOTE — Telephone Encounter (Signed)
Called her, no answer.  LMOM- please call back and leave specific question about her H pylori treatment and I will try to answer I cannot think of any reason why she cannot have her teeth cleaned

## 2017-07-20 NOTE — Progress Notes (Signed)
[facility] [address] [place], [place] [address] [phone]  Date:  07/22/2017   Name:  [name]   DOB:  Nov 30, 1965   MRN:  [id number]  PCP:  Darreld Mclean, MD    Chief Complaint: Abdominal Pain (c/o abd pain that has been present since last visit Feb. )   History of Present Illness:  [name] is a 52 y.o. very pleasant female patient who presents with the following:  See phone message from 3/22: Called her back- she has noted a tender area and a feeling of a lump in her LLQ for several weeks, not changing or getting worse,  Explained that I will need to see her prior to ordering a CT scan. She has an appt for next week. Offered to have her see UC or ER now- she feels ok waiting till next week but will contact me if any change or worsening in the meantime   She was diagnosed with H pylori via breath test earlier this month- we did our best to treat her given her many allergies to abx.  Given doxycycline and omeprazole,  She does feel like her stomach is much better She also saw Dr. Collene Mares who gave her linzess that she took for a week and this did seem to help- she is no longer taking the linzess.   She continues to notice that she will have a tender and protruding area in her LLQ- this is why she came in today. She wonders if it might be a hernia.   She is able to exercise but can't do her exercise classes as they put to much pressure on this area- things like kickboxing and core work are esp bothersome  She is not aware of any hernia dx in the past  She notes that this all started after she did a kickboxing class where there was a lot of jumping- the night after this class she noted the issue in her abdomen  Her menses have been more irregular over the last several months She had a menses 2 weeks ago but she is sttill spotting a bit now  (Advised that we needed to get an HCG for CT scan and she  wished to have a UA as well- noted a small amount of blood in her urine)  Patient Active Problem List   Diagnosis Date Noted  . Nocturia 07/19/2016  . Left lower quadrant pain 07/19/2016  . Rash and nonspecific skin eruption 03/21/2016  . Post concussive syndrome 01/22/2016  . Syncope and collapse 01/08/2016  . Osteoarthritis 07/08/2015  . Hematuria 01/20/2015  . Hyperlipidemia, mild 01/20/2015  . Head congestion 01/20/2015  . Fatigue 10/24/2014  . Chest pressure 10/24/2014  . Pansinusitis 10/11/2014  . Preventative health care 06/03/2014  . Diverticulosis 07/01/2011  . Perimenopausal 03/25/2011  . Fibroids 10/24/2010  . Anemia 03/25/2010  . IRRITABLE BOWEL SYNDROME 03/25/2010  . Mitral valve disorder 07/09/2007  . Asthma, exogenous 07/09/2007  . ESOPHAGEAL REFLUX 07/09/2007  . DERMATITIS 07/09/2007  . LOW BACK PAIN 12/14/2006    Past Medical History:  Diagnosis Date  . Allergic rhinitis   . Anemia   . Anxiety   . Asthma   . Contact dermatitis and eczema   . Diverticulitis    CT Scan  . Esophageal reflux   . Hyperlipidemia, mixed   . IBS (irritable bowel syndrome)   . IC (interstitial cystitis)   .  Internal hemorrhoids   . Lumbago   . Mitral valve disorders(424.0)   . Osteoarthritis 07/08/2015  . Post concussive syndrome     Past Surgical History:  Procedure Laterality Date  . BARTHOLIN CYST MARSUPIALIZATION N/A 12/18/2013   Procedure: BARTHOLIN CYST MARSUPIALIZATION WITH BIOPSY;  Surgeon: Lovenia Kim, MD;  Location: [facility];  Service: Gynecology;  Laterality: N/A;  . CESAREAN SECTION    . DILITATION & CURRETTAGE/HYSTROSCOPY WITH NOVASURE ABLATION N/A 10/14/2012   Procedure: DILATATION & CURETTAGE/HYSTEROSCOPY WITH NOVASURE ABLATION;  Surgeon: Lovenia Kim, MD;  Location: [facility];  Service: Gynecology;  Laterality: N/A;  . MOUTH SURGERY    . SINUS SURGERY WITH INSTATRAK      Social History   Tobacco Use  . Smoking status: Never Smoker  . Smokeless  tobacco: Never Used  Substance Use Topics  . Alcohol use: Yes    Comment: Holidays only  . Drug use: No    Family History  Problem Relation Age of Onset  . Colon polyps Sister   . Mental illness Sister        anxiety from 9/11 survivors  . Varicose Veins Sister   . Diabetes Mother   . Hypertension Mother   . Heart attack Mother        MI at age 53  . Alcohol abuse Mother        smoker  . Heart disease Mother   . Dementia Mother   . Hypertension Father   . Hyperlipidemia Father   . Heart attack Father   . Cancer Father        lung cancer, smoker  . Cancer Brother        acute leukemia  . Dementia Maternal Grandmother   . Heart disease Maternal Grandfather        mi  . Cancer Paternal Grandmother        GYN cancer  . Other Sister        hypoglycemia  . Varicose Veins Sister   . Asthma Sister        rheumatoid  . Fibromyalgia Sister   . Mental retardation Sister        depression  . GER disease Son   . Alcohol abuse Unknown        Family history  . Colon cancer Neg Hx   . Breast cancer Neg Hx     Allergies  Allergen Reactions  . Aspirin Shortness Of Breath    "irritated stomach" Ibuprofen and Naproxen do not agree with her either  . Latex Shortness Of Breath and Itching  . Other Shortness Of Breath    peanuts  . Sulfamethoxazole-Trimethoprim Shortness Of Breath  . Sulfonamide Derivatives Shortness Of Breath and Itching  . Sulphadimidine Sodium [Sulfamethazine Sodium] Shortness Of Breath    Added to food for preservative  . Amoxicillin     Doesn't remember rxn  . Azithromycin Other (See Comments)    Faintness  . Carisoprodol   . Ciprofloxacin     REACTION: SOB, "Tightness in head"  . Clarithromycin     "Doesn't agree with me"  . Codeine     Does not take percocet or hydrocodone; tylenol only  . Epinephrine     Heart racing  . Metronidazole Other (See Comments)    "cannot tolerate"  . Nsaids   . Penicillins     Doesn't remember reaction     Medication list has been reviewed and updated.  Current Outpatient Medications on File Prior  to Visit  Medication Sig Dispense Refill  . ALPRAZolam (XANAX) 0.25 MG tablet Take 1/2 or 1 every 12 hours as needed for anxiety 20 tablet 0  . doxycycline (VIBRA-TABS) 100 MG tablet Take 1 tablet (100 mg total) by mouth 2 (two) times daily. Can give capsules or generic 20 tablet 0  . fluticasone (FLONASE) 50 MCG/ACT nasal spray Place 2 sprays into both nostrils daily. 16 g 1  . hyoscyamine (LEVSIN, ANASPAZ) 0.125 MG tablet Take by mouth.    . levocetirizine (XYZAL) 5 MG tablet Take 1 tablet (5 mg total) by mouth every evening. 30 tablet 0  . omeprazole (PRILOSEC) 40 MG capsule      No current facility-administered medications on file prior to visit.     Review of Systems:  As per HPI- otherwise negative   Physical Examination: Vitals:   07/22/17 1804  BP: 96/72  Pulse: 80  Temp: 98.5 F (36.9 C)  SpO2: 96%   Vitals:   07/22/17 1804  Weight: 107 lb (48.5 kg)  Height: 4\' 11"  (1.499 m)   Body mass index is 21.61 kg/m. Ideal Body Weight: Weight in (lb) to have BMI = 25: 123.5  GEN: WDWN, NAD, Non-toxic, A & O x 3, slim build, looks well  HEENT: Atraumatic, Normocephalic. Neck supple. No masses, No LAD. Bilateral TM wnl, oropharynx normal.  PEERL,EOMI.   Ears and Nose: No external deformity. CV: RRR, No M/G/R. No JVD. No thrill. No extra heart sounds. PULM: CTA B, no wheezes, crackles, rhonchi. No retractions. No resp. distress. No accessory muscle use. ABD: S, NT, ND, +BS. No rebound. No HSM. EXTR: No c/c/e NEURO Normal gait.  PSYCH: Normally interactive. Conversant. Not depressed or anxious appearing.  Calm demeanor.  Cannot palpate a definite hernia, but she may have slight fullness in the left left inguinal region and is tender in this area as well Otherwise belly is benign tenderness is more in the groin, not the LLQ- it is lower   Results for orders placed or  performed in visit on 07/22/17  POCT urinalysis dipstick  Result Value Ref Range   Color, UA yellow yellow   Clarity, UA clear clear   Glucose, UA negative negative mg/dL   Bilirubin, UA negative negative   Ketones, POC UA negative negative mg/dL   Spec Grav, UA 1.015 1.010 - 1.025   Blood, UA small (A) negative   pH, UA 6.0 5.0 - 8.0   Protein Ur, POC negative negative mg/dL   Urobilinogen, UA 0.2 0.2 or 1.0 E.U./dL   Nitrite, UA Negative Negative   Leukocytes, UA Negative Negative  POCT urine pregnancy  Result Value Ref Range   Preg Test, Ur Negative Negative   BP Readings from Last 3 Encounters:  07/22/17 96/72  06/24/17 124/70  12/24/16 116/78    Assessment and Plan: Left lower quadrant abdominal mass - Plan: CT Abdomen Pelvis W Contrast  Abdominal distension (gaseous) - Plan: POCT urinalysis dipstick, POCT urine pregnancy, CT Abdomen Pelvis W Contrast  Here today with asymmetry and bulging in her LLQ- suspect a hernia Order CT If nothing apparent, or if hernia is seen, plan referral to general surgery   Signed Lamar Blinks, MD

## 2017-07-22 ENCOUNTER — Encounter: Payer: Self-pay | Admitting: Family Medicine

## 2017-07-22 ENCOUNTER — Ambulatory Visit (INDEPENDENT_AMBULATORY_CARE_PROVIDER_SITE_OTHER): Payer: 59 | Admitting: Family Medicine

## 2017-07-22 VITALS — BP 96/72 | HR 80 | Temp 98.5°F | Ht 59.0 in | Wt 107.0 lb

## 2017-07-22 DIAGNOSIS — R1904 Left lower quadrant abdominal swelling, mass and lump: Secondary | ICD-10-CM | POA: Diagnosis not present

## 2017-07-22 DIAGNOSIS — R140 Abdominal distension (gaseous): Secondary | ICD-10-CM

## 2017-07-22 LAB — POCT URINALYSIS DIP (MANUAL ENTRY)
BILIRUBIN UA: NEGATIVE
GLUCOSE UA: NEGATIVE mg/dL
Ketones, POC UA: NEGATIVE mg/dL
LEUKOCYTES UA: NEGATIVE
NITRITE UA: NEGATIVE
Protein Ur, POC: NEGATIVE mg/dL
Spec Grav, UA: 1.015 (ref 1.010–1.025)
UROBILINOGEN UA: 0.2 U/dL
pH, UA: 6 (ref 5.0–8.0)

## 2017-07-22 LAB — POCT URINE PREGNANCY: PREG TEST UR: NEGATIVE

## 2017-07-22 NOTE — Patient Instructions (Signed)
Good to see you, we will set up a CT for you to check your belly and pelvis. Let me know if you don't get this appt soon- will be in touch with your results asap

## 2017-07-29 ENCOUNTER — Other Ambulatory Visit (HOSPITAL_BASED_OUTPATIENT_CLINIC_OR_DEPARTMENT_OTHER): Payer: 59

## 2017-07-30 ENCOUNTER — Other Ambulatory Visit (HOSPITAL_BASED_OUTPATIENT_CLINIC_OR_DEPARTMENT_OTHER): Payer: 59

## 2017-07-30 ENCOUNTER — Encounter: Payer: Self-pay | Admitting: Internal Medicine

## 2017-09-03 ENCOUNTER — Other Ambulatory Visit: Payer: Self-pay

## 2017-09-03 ENCOUNTER — Telehealth: Payer: Self-pay

## 2017-09-03 DIAGNOSIS — Z8619 Personal history of other infectious and parasitic diseases: Secondary | ICD-10-CM

## 2017-09-03 DIAGNOSIS — Z0000 Encounter for general adult medical examination without abnormal findings: Secondary | ICD-10-CM

## 2017-09-03 NOTE — Telephone Encounter (Signed)
Called patient back, she states she would like to come in to test for immunity- also would like Hypylori test done as she has had this in the past and tested positive, she would like to make sure it has cleared. I have scheduled patient to come in on Monday at 7:30 per her request. I have have signed MMR immunity order and pended the H pylori. Please advise if this is ok to check.    When explaining to her about the CT and IV she said when she was with her son and he had to have an IV placed she was concerned about the size of the needle beeing to large. She asked the nurse if they had a butterfly needle to place an IV and they told her no. She also asked them if they had any numbing medication for the IV site and states that has helped in the past and was told no about that as well. She said if they can not accommodate her needs she will not have it done.  

## 2017-09-03 NOTE — Telephone Encounter (Signed)
Copied from [place] [id number]. Topic: Inquiry >> Sep 02, 2017 12:30 PM [name] wrote: Reason for CRM: PT wants to know if Dr Lorelei Pont recommends she get the measles booster.

## 2017-09-03 NOTE — Telephone Encounter (Signed)
Copied from [place] [id number]. Topic: Referral - Question >> Sep 02, 2017 12:26 PM Aurelio Brash B wrote: Reason for CRM: PT is asking if an U/S could be done instead of the CT or her referral for CT be sent to another place in [place] that has more options for IV'S because she has small veins

## 2017-09-03 NOTE — Telephone Encounter (Signed)
Patient requested call back before 5 today. Spoke with Dr. Nani Ravens to see if patient could come in on Monday for H pylori test- he states it would just come back positive. She asked to do the breath test instead. I have ordered breath test as patient requested. She has rescheduled her appointment for 5/17.

## 2017-09-03 NOTE — Telephone Encounter (Signed)
Please give her a call back 1. She is almost certainly fully immunized against measles.  However I am glad to order a titer- blood test to check for immunity if she would like   Second message from pt; Reason for CRM: PT is asking if an U/S could be done instead of the CT or her referral for CT be sent to another place in [place] that has more options for IV'S because she has small veins  2. No, an ultrasound will not give [place] the information that we need, CT is necessary.  I am not sure what she means by "more options for IVs."  Could she be more specific?  I feel sure that any location that offers CT scanning will have an experienced nurse to place an IV for her   Thanks Mel Almond! [place]

## 2017-09-06 ENCOUNTER — Other Ambulatory Visit: Payer: 59

## 2017-09-06 ENCOUNTER — Other Ambulatory Visit (INDEPENDENT_AMBULATORY_CARE_PROVIDER_SITE_OTHER): Payer: 59

## 2017-09-06 DIAGNOSIS — Z8619 Personal history of other infectious and parasitic diseases: Secondary | ICD-10-CM | POA: Diagnosis not present

## 2017-09-06 DIAGNOSIS — Z0000 Encounter for general adult medical examination without abnormal findings: Secondary | ICD-10-CM | POA: Diagnosis not present

## 2017-09-07 LAB — H. PYLORI BREATH TEST: H. PYLORI BREATH TEST: DETECTED — AB

## 2017-09-07 LAB — MEASLES/MUMPS/RUBELLA IMMUNITY
Mumps IgG: 245 AU/mL
Rubella: 3.2 index

## 2017-09-07 NOTE — Progress Notes (Signed)
HPI: FU CP.  ABIs May 2017 normal.  Monitor October 2017 showed sinus rhythm.  Also seen for syncopal episode in October 2017.  MRI negative.  EEG normal.  Seen by neurology and felt to have postconcussive syndrome.  Exercise treadmill October 2018 was negative.  Echocardiogram March 2019 showed normal LV systolic function.  Since last seen patient denies dyspnea on exertion, orthopnea, PND, pedal edema or chest pain.  No recurrent syncope.  Current Outpatient Medications  Medication Sig Dispense Refill  . ALPRAZolam (XANAX) 0.25 MG tablet Take 1/2 or 1 every 12 hours as needed for anxiety 20 tablet 0  . fluticasone (FLONASE) 50 MCG/ACT nasal spray Place 2 sprays into both nostrils daily. 16 g 1  . Probiotic Product (PROBIOTIC DAILY PO) Take 1 tablet by mouth daily.     No current facility-administered medications for this visit.      Past Medical History:  Diagnosis Date  . Allergic rhinitis   . Anemia   . Anxiety   . Asthma   . Contact dermatitis and eczema   . Diverticulitis    CT Scan  . Esophageal reflux   . H. pylori infection   . Hyperlipidemia, mixed   . IBS (irritable bowel syndrome)   . IC (interstitial cystitis)   . Internal hemorrhoids   . Lumbago   . Mitral valve disorders(424.0)   . Osteoarthritis 07/08/2015  . Post concussive syndrome     Past Surgical History:  Procedure Laterality Date  . BARTHOLIN CYST MARSUPIALIZATION N/A 12/18/2013   Procedure: BARTHOLIN CYST MARSUPIALIZATION WITH BIOPSY;  Surgeon: Lovenia Kim, MD;  Location: [facility];  Service: Gynecology;  Laterality: N/A;  . CESAREAN SECTION    . DILITATION & CURRETTAGE/HYSTROSCOPY WITH NOVASURE ABLATION N/A 10/14/2012   Procedure: DILATATION & CURETTAGE/HYSTEROSCOPY WITH NOVASURE ABLATION;  Surgeon: Lovenia Kim, MD;  Location: [facility];  Service: Gynecology;  Laterality: N/A;  . MOUTH SURGERY    . SINUS SURGERY WITH INSTATRAK      Social History   Socioeconomic History  . Marital  status: Married    Spouse name: Not on file  . Number of children: 2  . Years of education: Masters  . Highest education level: Not on file  Occupational History  . Occupation: [employer]  Social Needs  . Financial resource strain: Not on file  . Food insecurity:    Worry: Not on file    Inability: Not on file  . Transportation needs:    Medical: Not on file    Non-medical: Not on file  Tobacco Use  . Smoking status: Never Smoker  . Smokeless tobacco: Never Used  Substance and Sexual Activity  . Alcohol use: Yes    Comment: Holidays only  . Drug use: No  . 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 Physical activity:    Days per week: Not on file    Minutes per session: Not on file  . Stress: Not on file  Relationships  . Social connections:    Talks on phone: Not on file    Gets together: Not on file    Attends religious service: Not on file    Active member of club or organization: Not on file    Attends meetings of clubs or organizations: Not on file    Relationship status: Not on file  .  Intimate partner violence:    Fear of current or ex partner: Not on file    Emotionally abused: Not on file    Physically abused: Not on file    Forced sexual activity: Not on file  Other Topics Concern  . Not on file  Social History Narrative   Lives at home home with husband and son.   Right-handed.   1 cup caffeine daily.    Family History  Problem Relation Age of Onset  . Colon polyps Sister   . Mental illness Sister        anxiety from 9/11 survivors  . Varicose Veins Sister   . Diabetes Mother   . Hypertension Mother   . Heart attack Mother        MI at age 48  . Alcohol abuse Mother        smoker  . Heart disease Mother   . Dementia Mother   . Hypertension Father   . Hyperlipidemia Father   . Heart attack Father   . Cancer Father        lung cancer,  smoker  . Cancer Brother        acute leukemia  . Dementia Maternal Grandmother   . Heart disease Maternal Grandfather        mi  . Cancer Paternal Grandmother        GYN cancer  . Other Sister        hypoglycemia  . Varicose Veins Sister   . Asthma Sister        rheumatoid  . Fibromyalgia Sister   . Mental retardation Sister        depression  . GER disease Son   . Alcohol abuse Unknown        Family history  . Colon cancer Neg Hx   . Breast cancer Neg Hx     ROS: Complains of GI discomfort but no fevers or chills, productive cough, hemoptysis, dysphasia, odynophagia, melena, hematochezia, dysuria, hematuria, rash, seizure activity, orthopnea, PND, pedal edema, claudication. Remaining systems are negative.  Physical Exam: Well-developed well-nourished in no acute distress.  Skin is warm and dry.  HEENT is normal.  Neck is supple.  Chest is clear to auscultation with normal expansion.  Cardiovascular exam is regular rate and rhythm.  Abdominal exam nontender or distended. No masses palpated. Extremities show no edema. neuro grossly intact  ECG-June 04, 2017-sinus rhythm with no ST changes.  Personally reviewed  A/P  1 history of syncope-no recurrent episodes.  2 question history of mitral valve prolapse-not evident on most recent echocardiogram.  3 hyperlipidemia-managed by primary care.  4 chest pain-no recurrent symptoms.  Previous evaluation negative.  No plans for further ischemia evaluation.  5 history of post concussive syndrome-Per neurology.  Kirk Ruths, MD

## 2017-09-10 ENCOUNTER — Other Ambulatory Visit: Payer: 59

## 2017-09-13 ENCOUNTER — Telehealth: Payer: Self-pay

## 2017-09-13 ENCOUNTER — Encounter: Payer: Self-pay | Admitting: Internal Medicine

## 2017-09-13 ENCOUNTER — Telehealth: Payer: Self-pay | Admitting: Internal Medicine

## 2017-09-13 ENCOUNTER — Encounter

## 2017-09-13 ENCOUNTER — Ambulatory Visit: Payer: 59 | Admitting: Internal Medicine

## 2017-09-13 VITALS — BP 100/72 | HR 84 | Ht 59.0 in | Wt 107.0 lb

## 2017-09-13 DIAGNOSIS — A048 Other specified bacterial intestinal infections: Secondary | ICD-10-CM

## 2017-09-13 DIAGNOSIS — K5900 Constipation, unspecified: Secondary | ICD-10-CM | POA: Diagnosis not present

## 2017-09-13 DIAGNOSIS — R1032 Left lower quadrant pain: Secondary | ICD-10-CM | POA: Diagnosis not present

## 2017-09-13 DIAGNOSIS — K219 Gastro-esophageal reflux disease without esophagitis: Secondary | ICD-10-CM

## 2017-09-13 NOTE — Telephone Encounter (Signed)
Copied from [place] [id number]. Topic: Quick Communication - Lab Results >> Sep 13, 2017  2:55 PM Lennox Solders wrote: Pt is calling and would like blood work results

## 2017-09-13 NOTE — Telephone Encounter (Signed)
Spoke with patient about lab results. Verbalized understanding.

## 2017-09-13 NOTE — Telephone Encounter (Signed)
Patient states she called [facility] and they can not use a a butterfly needle and do what she wants. Patient wants CT scan canceled and rescheduled at [facility] where she had it a couple years ago.

## 2017-09-13 NOTE — Progress Notes (Signed)
HISTORY OF PRESENT ILLNESS:  [name] is a pleasant 52 y.o. female who has been followed in this office for GERD, IBS, multiple somatic complaints, and health related anxiety. She has not been seen since November 2015 when she saw the GI physician assistant with chief complaint of GERD and dysphagia as well as irritable bowel syndrome and hemorrhoids. Esophagram was negative. Levsin prescribed for abdominal cramping. Hemorrhoids treated with Anusol. Told to follow-up in several months but did not. Last upper endoscopy July 2013 was normal. Last colonoscopy including intubation of the terminal ileum July 2013 was normal. Work from March 2019 was normal including comprehensive metabolic panel and CBC with differential. Normal TSH. Abdominal ultrasound to evaluate left lower quadrant pain August 2017 revealed negative visualized areas. Said to have diverticulitis of the sigmoid colon on a remote CT scan in 2013. Presents now with left lower quadrant pain associated with constipation. Saw another gastroenterologist, Dr. Collene Mares February 2019. I have reviewed that encounter. She was treated with linzess 72 g which helped. The patient stopped that she was worried about dehydration. For reflux she will take occasional PPI. She is concerned about medication side effects. Her PCP diagnosed Helicobacter pylori. Eventually treated with just doxycycline and omeprazole due to multiple drug intolerances. Listed allergies or intolerances include metronidazole and penicillins. Recent repeat testing via breath test was positive. The patient continues to worry about ominous diagnoses and constantly asks "do think this is anything serious" as she has in the past. No fevers or bleeding.  REVIEW OF SYSTEMS:  All non-GI ROS negative except for sinus allergy, anxiety, cough, confusion, fatigue, menstrual cramps, itching, urinary leakage, skin rash, sleeping problems  Past Medical History:  Diagnosis Date  . Allergic  rhinitis   . Anemia   . Anxiety   . Asthma   . Contact dermatitis and eczema   . Diverticulitis    CT Scan  . Esophageal reflux   . H. pylori infection   . Hyperlipidemia, mixed   . IBS (irritable bowel syndrome)   . IC (interstitial cystitis)   . Internal hemorrhoids   . Lumbago   . Mitral valve disorders(424.0)   . Osteoarthritis 07/08/2015  . Post concussive syndrome     Past Surgical History:  Procedure Laterality Date  . BARTHOLIN CYST MARSUPIALIZATION N/A 12/18/2013   Procedure: BARTHOLIN CYST MARSUPIALIZATION WITH BIOPSY;  Surgeon: Lovenia Kim, MD;  Location: [facility];  Service: Gynecology;  Laterality: N/A;  . CESAREAN SECTION    . DILITATION & CURRETTAGE/HYSTROSCOPY WITH NOVASURE ABLATION N/A 10/14/2012   Procedure: DILATATION & CURETTAGE/HYSTEROSCOPY WITH NOVASURE ABLATION;  Surgeon: Lovenia Kim, MD;  Location: [facility];  Service: Gynecology;  Laterality: N/A;  . MOUTH SURGERY    . SINUS SURGERY WITH INSTATRAK      Social History Chong January  reports that she has never smoked. She has never used smokeless tobacco. She reports that she drinks alcohol. She reports that she does not use drugs.  family history includes Alcohol abuse in her mother and unknown relative; Asthma in her sister; Cancer in her brother, father, and paternal grandmother; Colon polyps in her sister; Dementia in her maternal grandmother and mother; Diabetes in her mother; Fibromyalgia in her sister; GER disease in her son; Heart attack in her father and mother; Heart disease in her maternal grandfather and mother; Hyperlipidemia in her father; Hypertension in her father and mother; Mental illness in her sister; Mental retardation in her sister; Other in her sister; Varicose Veins in  her sister and sister.  Allergies  Allergen Reactions  . Aspirin Shortness Of Breath    "irritated stomach" Ibuprofen and Naproxen do not agree with her either  . Latex Shortness Of Breath and Itching  . Other  Shortness Of Breath    peanuts  . Sulfamethoxazole-Trimethoprim Shortness Of Breath  . Sulfonamide Derivatives Shortness Of Breath and Itching  . Sulphadimidine Sodium [Sulfamethazine Sodium] Shortness Of Breath    Added to food for preservative  . Amoxicillin     Doesn't remember rxn  . Azithromycin Other (See Comments)    Faintness  . Carisoprodol   . Ciprofloxacin     REACTION: SOB, "Tightness in head"  . Clarithromycin     "Doesn't agree with me"  . Codeine     Does not take percocet or hydrocodone; tylenol only  . Epinephrine     Heart racing  . Metronidazole Other (See Comments)    "cannot tolerate"  . Nsaids   . Penicillins     Doesn't remember reaction       PHYSICAL EXAMINATION: Vital signs: BP 100/72 (BP Location: Left Arm, Patient Position: Sitting, Cuff Size: Normal)   Pulse 84   Ht 4\' 11"  (1.499 m)   Wt 107 lb (48.5 kg)   BMI 21.61 kg/m   Constitutional: generally well-appearing, no acute distress Psychiatric: alert and oriented x3, cooperative. Anxious with pressured speech Eyes: extraocular movements intact, anicteric, conjunctiva pink Mouth: oral pharynx moist, no lesions Neck: supple no lymphadenopathy Cardiovascular: heart regular rate and rhythm, no murmur Lungs: clear to auscultation bilaterally Abdomen: soft, slight fullness with mild discomfort left lower quadrant toward the midline, nondistended, no obvious ascites, no peritoneal signs, normal bowel sounds, no organomegaly Rectal:omitted Extremities: no clubbing, cyanosis, or lower extremity edema bilaterally Skin: no lesions on visible extremities Neuro: No focal deficits. Cranial nerves intact  ASSESSMENT:  #1.Left lower quadrant pain. Has had intermittent over the years. Suspect related to constipation or spasm. Rule out diverticulitis #2. Normal EGD and colonoscopy 2013 #3. History of GERD with variable symptoms at variable times #4.Functional constipation #5. Positive Helicobacter  pylori breath test after suboptimal treatment regimen. Multiple drug intolerances #6. Health related anxiety  PLAN:  #1. Recommended MiraLAX for constipation. Hopefully this will help her pain #2. Contrast-enhanced CT scan of the abdomen and pelvis to rule out more ominous diagnoses. Fullness on exam likely stool. However, needs further evaluation with advanced imaging #3. OTC PPI on demand #4. Reflux precautions #5. Referred to infectious disease specialist to treat and monitor her Helicobacter pylori infection given her issues with medication intolerances #6. Routine colonoscopy around 2023. Sooner if needed #7. Routine GI follow-up in a few months after the above completed

## 2017-09-13 NOTE — Patient Instructions (Addendum)
You have been scheduled for a CT scan of the abdomen and pelvis at [facility] ([address]---this is in the same building as [facility]).   You are scheduled on 09/23/2017 at 2:30pm. You should arrive 15 minutes prior to your appointment time for registration. Please follow the written instructions below on the day of your exam:  WARNING: IF YOU ARE ALLERGIC TO IODINE/X-RAY DYE, PLEASE NOTIFY RADIOLOGY IMMEDIATELY AT [phone]! YOU WILL BE GIVEN A 13 HOUR PREMEDICATION PREP.  1) Do not eat anything after 10:30am (4 hours prior to your test) 2) You have been given 2 bottles of oral contrast to drink. The solution may taste               better if refrigerated, but do NOT add ice or any other liquid to this solution. Shake well before drinking.    Drink 1 bottle of contrast @ 12:30pm (2 hours prior to your exam)  Drink 1 bottle of contrast @ 1:30pm (1 hour prior to your exam)  You may take any medications as prescribed with a small amount of water except for the following: Metformin, Glucophage, Glucovance, Avandamet, Riomet, Fortamet, Actoplus Met, Janumet, Glumetza or Metaglip. The above medications must be held the day of the exam AND 48 hours after the exam.  The purpose of you drinking the oral contrast is to aid in the visualization of your intestinal tract. The contrast solution may cause some diarrhea. Before your exam is started, you will be given a small amount of fluid to drink. Depending on your individual set of symptoms, you may also receive an intravenous injection of x-ray contrast/dye. Plan on being at [facility] for 30 minutes or long, depending on the type of exam you are having performed.  If you have any questions regarding your exam or if you need to reschedule, you may call the CT department at [phone] between the hours of 8:00 am and 5:00 pm, Monday-Friday.  You will be contacted by infectious disease to have an appointment  scheduled  Take Miralax over the counter daily  ________________________________________________________________________

## 2017-09-13 NOTE — Progress Notes (Signed)
Patient states she called GI they are setting her up an appointment with Infectious Disease to found out a proper treatment.

## 2017-09-14 ENCOUNTER — Telehealth: Payer: Self-pay | Admitting: Family Medicine

## 2017-09-14 NOTE — Telephone Encounter (Signed)
Discussed with pt that the CT would be with contrast. Pt wanted to know if Dr. Henrene Pastor would give her something for her anxiety regarding the IV. Please advise.

## 2017-09-14 NOTE — Telephone Encounter (Signed)
5 mg of Valium x one 20 minutes before she arrives. However, she may not Drive

## 2017-09-14 NOTE — Telephone Encounter (Signed)
Chief Strategy Officer received a call from pt. Via [facility] regarding upcoming CT abdomen scheduled for 5/25 at 10AM.  Author phoned CT at [facility] and spoke with Barbaraann Barthel, radiology tech, regarding patient concerns per documentation. Junie Panning stated that she spoke with pt. Over the phone for 20 minutes earlier today reviewing the procedure and addressing her concerns(needle gauge, numbing of insertion site). Imaging is unable to use butterfly needles due to nature of contrast, and the risk of subsequent vein damage. Radiology cannot administer emla cream per Junie Panning, due to scope of practice and not knowing exactly what location the contrast will be administered IV prior to exam. Author phoned pt. to see reason for calling primary practice, and pt. reiterated that she was anxious about the exam, from previous bad experience with pain and nausea when receiving an IV. Author stated that there was a potential possibility, per Dr. Lillie Fragmin approval, of doing just oral contrast, but views would be limited per [facility]. Considering pt's condition, it would be best to do IV as well, author advised, and pt. agreed. Author suggested using distraction and perhaps application of ice to insertion site, and pt. Stated "oh ice, yes! That's a good idea!". Pt. Appreciative of additional reassurance and advice. Routed to Dr. Lorelei Pont and radiology tech, Junie Panning for review.

## 2017-09-14 NOTE — Telephone Encounter (Signed)
Copied from [place] [id number]. Topic: Inquiry >> Sep 14, 2017  8:23 AM [name] wrote: Reason for CRM:  PT wants to know if Dr  Lorelei Pont  can order Emla  cream to numb area where iv is going in  when she has her ct scan

## 2017-09-14 NOTE — Telephone Encounter (Signed)
Spoke with pt and she thanked Dr. Henrene Pastor for the offer but she has to drive herself to the appointment. Prescription not called in.

## 2017-09-14 NOTE — Telephone Encounter (Signed)
Will speak with Dr. Lorelei Pont when she is back in the office about patient's concerns.

## 2017-09-14 NOTE — Telephone Encounter (Signed)
[name] with [facility] calling because the pt cancelled CT that was scheduled for her yesterday. She is needing some information before she calls the pt back. Please call her at [phone].

## 2017-09-14 NOTE — Telephone Encounter (Signed)
Pt is scheduled for CT on Saturday at [facility] and wants to know if she needs the CT with or without contrast?

## 2017-09-15 ENCOUNTER — Encounter: Payer: Self-pay | Admitting: Cardiology

## 2017-09-15 ENCOUNTER — Ambulatory Visit (INDEPENDENT_AMBULATORY_CARE_PROVIDER_SITE_OTHER): Payer: 59 | Admitting: Cardiology

## 2017-09-15 VITALS — BP 105/69 | HR 81 | Ht 59.0 in | Wt 106.0 lb

## 2017-09-15 DIAGNOSIS — R079 Chest pain, unspecified: Secondary | ICD-10-CM | POA: Diagnosis not present

## 2017-09-15 DIAGNOSIS — E785 Hyperlipidemia, unspecified: Secondary | ICD-10-CM | POA: Diagnosis not present

## 2017-09-15 DIAGNOSIS — I059 Rheumatic mitral valve disease, unspecified: Secondary | ICD-10-CM | POA: Diagnosis not present

## 2017-09-15 NOTE — Telephone Encounter (Signed)
See previous note. 09/13/17 Dr. Henrene Pastor GI, offered patient 5mg  valium-explained to her that she could not drive, patient denied medication due to having to drive herself.

## 2017-09-15 NOTE — Telephone Encounter (Signed)
Copied from [place] [id number]. Topic: General - Other >> Sep 14, 2017 10:21 AM Yvette Rack wrote: Reason for CRM: Pt request that a low dosage anxiety medication be prescribed before the procedure if possible. Pt request a call back at [phone] to discuss.

## 2017-09-15 NOTE — Patient Instructions (Signed)
Your physician wants you to follow-up in: ONE YEAR WITH DR CRENSHAW You will receive a reminder letter in the mail two months in advance. If you don't receive a letter, please call our office to schedule the follow-up appointment.   If you need a refill on your cardiac medications before your next appointment, please call your pharmacy.  

## 2017-09-16 NOTE — Telephone Encounter (Signed)
Patient had an appointment with Dr. Henrene Pastor on 09/13/17

## 2017-09-17 DIAGNOSIS — R413 Other amnesia: Secondary | ICD-10-CM | POA: Diagnosis not present

## 2017-09-18 ENCOUNTER — Ambulatory Visit (HOSPITAL_BASED_OUTPATIENT_CLINIC_OR_DEPARTMENT_OTHER)
Admission: RE | Admit: 2017-09-18 | Discharge: 2017-09-18 | Disposition: A | Discharge status: Home / Self Care | Payer: 59 | Source: Ambulatory Visit | Attending: Family Medicine | Admitting: Family Medicine

## 2017-09-18 ENCOUNTER — Encounter (HOSPITAL_BASED_OUTPATIENT_CLINIC_OR_DEPARTMENT_OTHER): Payer: Self-pay

## 2017-09-18 DIAGNOSIS — R1904 Left lower quadrant abdominal swelling, mass and lump: Secondary | ICD-10-CM | POA: Diagnosis not present

## 2017-09-18 DIAGNOSIS — R140 Abdominal distension (gaseous): Secondary | ICD-10-CM | POA: Diagnosis not present

## 2017-09-18 DIAGNOSIS — R109 Unspecified abdominal pain: Secondary | ICD-10-CM | POA: Diagnosis not present

## 2017-09-18 MED ORDER — IOPAMIDOL (ISOVUE-300) INJECTION 61%
100.0000 mL | Freq: Once | INTRAVENOUS | Status: AC | PRN
  Administered 2017-09-18: 80 mL via INTRAVENOUS

## 2017-09-23 ENCOUNTER — Inpatient Hospital Stay: Admission: RE | Admit: 2017-09-23 | Payer: 59 | Source: Ambulatory Visit

## 2017-09-28 ENCOUNTER — Telehealth: Payer: Self-pay | Admitting: Internal Medicine

## 2017-09-28 ENCOUNTER — Other Ambulatory Visit: Payer: Self-pay | Admitting: Obstetrics and Gynecology

## 2017-09-28 ENCOUNTER — Telehealth: Payer: Self-pay | Admitting: Family Medicine

## 2017-09-28 NOTE — Telephone Encounter (Signed)
I had already reviewed the scan results and discussed these results with Dr. Lorelei Pont last week. No further GI testing at this point. Thank you for checking

## 2017-09-28 NOTE — Telephone Encounter (Signed)
Told patient pcp has not yet looked over results or commented on them. Will tell patient when we have the results.

## 2017-09-28 NOTE — Telephone Encounter (Signed)
Pt had her CT done at [facility], Dr. Edilia Bo ordered the CT for her. Pt wants to make sure Dr. Henrene Pastor reviews the scan to see if she needs any further GI testing. Please advise.

## 2017-09-28 NOTE — Telephone Encounter (Signed)
I had actually discussed results with pt last week, touched base with Dr. Henrene Pastor who does not require further eval and does not feel that colitis is significant. Had advised pt that I was trying to get a hold of Dr. Ronita Hipps due to findings as below:  IMPRESSION: Enlarged masslike appearance of the uterus. This may represent a large intramural fundal fibroid displacing the endometrium, however more severe pathology cannot be excluded. Further evaluation with pelvic ultrasound or pelvic MRI with contrast may be considered. Mild circumferential mucosal thickening of the sigmoid colon, may represent segmental colitis. No evidence of acute abnormalities within the solid abdominal organs. Small amount of free fluid in the right anterior hemipelvis, significance uncertain.  Have called [facility] and LM a couple of times last week, called back again today and was able to speak with a nurse and they plan to have pt in for eval.  Called pt and gave her this update, asked her to let me know if not able to get an OBG appt as expected

## 2017-09-28 NOTE — Telephone Encounter (Signed)
Spoke with pt and let her know. She will follow up with PCP.

## 2017-09-28 NOTE — Telephone Encounter (Signed)
Patient states she had her CT with contrast done on 5.25.19 at [facility] closer to her home. Patient would like Dr.Perry to review results and make sure she does not need to do any further gi testings.

## 2017-09-28 NOTE — Telephone Encounter (Signed)
Copied from [place] [phone]. Topic: Quick Communication - See Telephone Encounter >> Sep 28, 2017 10:21 AM Rutherford Nail, NT wrote: CRM for notification. See Telephone encounter for: 09/28/17. Patient calling and is wanting to know if Dr Lorelei Pont has forwarded her CT results to Dr Ronita Hipps and Dr Henrene Pastor? States that she has not received a call from either doctor and if just double checking to see that it was done. CB#: [phone]

## 2017-10-04 ENCOUNTER — Ambulatory Visit: Payer: 59 | Admitting: Infectious Diseases

## 2017-10-06 ENCOUNTER — Other Ambulatory Visit: Payer: Self-pay | Admitting: Obstetrics and Gynecology

## 2017-10-06 DIAGNOSIS — R102 Pelvic and perineal pain: Secondary | ICD-10-CM | POA: Diagnosis not present

## 2017-10-06 DIAGNOSIS — D259 Leiomyoma of uterus, unspecified: Secondary | ICD-10-CM | POA: Diagnosis not present

## 2017-10-06 DIAGNOSIS — R1032 Left lower quadrant pain: Secondary | ICD-10-CM | POA: Diagnosis not present

## 2017-10-07 ENCOUNTER — Other Ambulatory Visit: Payer: Self-pay | Admitting: Obstetrics and Gynecology

## 2017-10-18 ENCOUNTER — Encounter: Payer: Self-pay | Admitting: Infectious Diseases

## 2017-10-18 ENCOUNTER — Telehealth: Payer: Self-pay | Admitting: *Deleted

## 2017-10-18 ENCOUNTER — Ambulatory Visit (INDEPENDENT_AMBULATORY_CARE_PROVIDER_SITE_OTHER): Payer: 59 | Admitting: Infectious Diseases

## 2017-10-18 DIAGNOSIS — A048 Other specified bacterial intestinal infections: Secondary | ICD-10-CM

## 2017-10-18 MED ORDER — AMOXICILLIN 500 MG PO CAPS: 1000.0000 mg | ORAL_CAPSULE | Freq: Three times a day (TID) | ORAL | 0 refills | Status: AC

## 2017-10-18 MED ORDER — OMEPRAZOLE 40 MG PO CPDR: 40.0000 mg | DELAYED_RELEASE_CAPSULE | Freq: Two times a day (BID) | ORAL | 0 refills | Status: DC

## 2017-10-18 NOTE — Assessment & Plan Note (Addendum)
She failed ( had positive breath test after initial treatment). This is the case with 20% of patients.  She has a notable Amox allergy.  We discussed her allergy hx (she is unclear on most of her reactions) Will give her quad therapy, bismuth based, as rec by Up to date.  As I explained the components of the regimen, she has concerns about the drugs (could not tolerate pepto bismal).  Will try her on high dose dual rx as listed in up todate (PPI and Amox). She agrees to ttry amoxil. She doesn't remember her reaction to this (?stomach thing). Amoxil 1 g tid with PPI tid for 14 days.  If she is not able to tolerate this, [facility]

## 2017-10-18 NOTE — Progress Notes (Signed)
   Subjective:    Patient ID: [name], female    DOB: 05-19-1965, 52 y.o.   MRN: [id number]  HPI 52 yo F with hx of post-concussive syndrome, syncopal episode.  Was treated for H pylori in Feb of this year. She states he abd pain improved immediately.  Was given rx- PPI, bismuth, doxy.  She was seen by GI Collene Mares as well as Henrene Pastor).  She had repeat breath test that was +.  She had CT scan to eval abd pain. This showed a large fibroid.   She is concerned that she still has symptoms- intermittent nausea, burping had improved, her L sided abd achiness has improved as well.  Eats small portions, avoids spicey foods.  Her appetite has improved.   The past medical history, family history and social history were reviewed/updated in EPIC   Review of Systems  Constitutional: Negative for appetite change and unexpected weight change.  Gastrointestinal: Positive for constipation and nausea. Negative for diarrhea.  Genitourinary: Negative for difficulty urinating.  Psychiatric/Behavioral: The patient is nervous/anxious.   takes miralax regularly.  Has lost 7# intentionally. Exercising. Please see HPI. All other systems reviewed and negative.      Objective:   Physical Exam  Constitutional: She is oriented to person, place, and time. She appears well-developed and well-nourished.  HENT:  Head: Normocephalic and atraumatic.  Eyes: Pupils are equal, round, and reactive to light. EOM are normal.  Neck: Normal range of motion. Neck supple.  Cardiovascular: Normal rate, regular rhythm and normal heart sounds.  Pulmonary/Chest: Effort normal and breath sounds normal.  Abdominal: Soft. Bowel sounds are normal. She exhibits no distension and no mass. There is no tenderness. There is no rebound and no guarding.  Musculoskeletal: Normal range of motion. She exhibits no edema.  Lymphadenopathy:    She has no cervical adenopathy.  Neurological: She is alert and oriented to person, place, and  time.  Skin: Skin is warm and dry.  Psychiatric: Her mood appears anxious.       Assessment & Plan:

## 2017-10-18 NOTE — Telephone Encounter (Signed)
Patient called and advised she has concerns about taking such a large dose of Amoxicillin as she weighs so little and she may have an allergic reaction to it. Advised her will ask the provider if she can make any modifications to the dose or just change medications and give her a call back.  Asked Dr Johnnye Sima as he is in clinic today and he advised she can take the medication 1 tablet 3 times a day until complete. Patient notified and she understands how she is to take the medication.

## 2017-10-19 NOTE — Telephone Encounter (Signed)
Per response from Cassie called the patient to advise no need to separate the mediations and she should start as soon as possible. She was wondering since she is taking a lower dose if she should try to work half days or just stay out. Advised her to start taking the medication and see how she feels she may feel like going to work full time. Depending on how she feels she can let us know if she needs a different note. She agreed to wait and see.

## 2017-10-19 NOTE — Telephone Encounter (Signed)
Patient advised she was told to take the omeprazole and the antibiotic separately and wants to know how much time difference since she takes antibiotic 3 x daily and omeprazole 2 x daily. Advised her will ask provider and pharmacy staff and give her a call back.

## 2017-10-19 NOTE — Telephone Encounter (Signed)
There is no interaction between amoxicillin and omeprazole, so she does not have to separate them. Unless she is talking about a different regimen? But if amoxicillin and omeprazole, no need to worry as there is no interaction.

## 2017-10-19 NOTE — Telephone Encounter (Signed)
Thanks everyone

## 2017-10-21 ENCOUNTER — Telehealth: Payer: Self-pay | Admitting: Behavioral Health

## 2017-10-21 NOTE — Telephone Encounter (Signed)
Patient's FMLA paper work received via fax to be fiiled out.  Placed in providers box. Pricilla Riffle RN

## 2017-11-03 ENCOUNTER — Telehealth: Payer: Self-pay

## 2017-11-03 DIAGNOSIS — D259 Leiomyoma of uterus, unspecified: Secondary | ICD-10-CM | POA: Diagnosis not present

## 2017-11-03 DIAGNOSIS — R102 Pelvic and perineal pain: Secondary | ICD-10-CM | POA: Diagnosis not present

## 2017-11-03 NOTE — Telephone Encounter (Signed)
3rd call  4:00pm   Patient calling to check on status of FMLA  Form. 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  Offered to revise the current form on hand with the requested changes.   Form changed, dated and faxed to [phone]

## 2017-11-03 NOTE — Telephone Encounter (Signed)
Patient called stating FMLA form was incorrect and sent was sent home from work yesterday because form stated leave was continuous.   She is requesting intermittent leave.  Form was corrected on 11-01-17 and faxed to employer.   Patient has complaint of headache each time after taking the Prilosec along with slurred speech two days after start.  Please advise.   Laverle Patter, RN     2nd call    9:16 am  Patient states her human resources office is requiring the intermittent information to be changed to  5 times per week up to 1 day which will cover episodic and whole days when needed.  New form coming via fax.   Irvona

## 2017-11-17 ENCOUNTER — Telehealth: Payer: Self-pay | Admitting: *Deleted

## 2017-11-17 NOTE — Telephone Encounter (Signed)
Patient called, asked for Dr Johnnye Sima to adjust her FMLA end date. She stated that the way she is taking her medication, she will finish 8/2.  She states she is experiencing tiredness from her medications, that she leaves work daily around 4 pm to take a nap.  Please advise.  Additionally, she feels she had an allergic reaction to the prilosec, but "powered through" and finished it despite a tightness in her throat. She wants to know if there is another option for an acid reducer should she still need one - RN advised her to follow up with GI on this specific question.

## 2017-11-25 ENCOUNTER — Ambulatory Visit (HOSPITAL_BASED_OUTPATIENT_CLINIC_OR_DEPARTMENT_OTHER)
Admission: RE | Admit: 2017-11-25 | Discharge: 2017-11-25 | Disposition: A | Discharge status: Home / Self Care | Payer: 59 | Source: Ambulatory Visit | Attending: Medical | Admitting: Medical

## 2017-11-25 ENCOUNTER — Ambulatory Visit (INDEPENDENT_AMBULATORY_CARE_PROVIDER_SITE_OTHER): Payer: 59 | Admitting: Medical

## 2017-11-25 ENCOUNTER — Encounter: Payer: Self-pay | Admitting: Medical

## 2017-11-25 VITALS — BP 111/53 | HR 88 | Temp 98.1°F | Resp 16 | Ht 59.0 in | Wt 107.2 lb

## 2017-11-25 DIAGNOSIS — Z8619 Personal history of other infectious and parasitic diseases: Secondary | ICD-10-CM | POA: Diagnosis not present

## 2017-11-25 DIAGNOSIS — R05 Cough: Secondary | ICD-10-CM | POA: Diagnosis not present

## 2017-11-25 DIAGNOSIS — J324 Chronic pansinusitis: Secondary | ICD-10-CM

## 2017-11-25 DIAGNOSIS — R0989 Other specified symptoms and signs involving the circulatory and respiratory systems: Secondary | ICD-10-CM | POA: Insufficient documentation

## 2017-11-25 DIAGNOSIS — R059 Cough, unspecified: Secondary | ICD-10-CM

## 2017-11-25 DIAGNOSIS — R0602 Shortness of breath: Secondary | ICD-10-CM | POA: Diagnosis not present

## 2017-11-25 DIAGNOSIS — B079 Viral wart, unspecified: Secondary | ICD-10-CM

## 2017-11-25 DIAGNOSIS — M7989 Other specified soft tissue disorders: Secondary | ICD-10-CM

## 2017-11-25 DIAGNOSIS — R0981 Nasal congestion: Secondary | ICD-10-CM

## 2017-11-25 DIAGNOSIS — L989 Disorder of the skin and subcutaneous tissue, unspecified: Secondary | ICD-10-CM | POA: Diagnosis not present

## 2017-11-25 MED ORDER — FLUTICASONE PROPIONATE 50 MCG/ACT NA SUSP: 2.0000 | Freq: Every day | NASAL | 1 refills | Status: DC

## 2017-11-25 MED ORDER — BENZONATATE 100 MG PO CAPS: 100.0000 mg | ORAL_CAPSULE | Freq: Three times a day (TID) | ORAL | 0 refills | Status: DC | PRN

## 2017-11-25 NOTE — Patient Instructions (Signed)
You do have history of H. Pylori that was initially resistant to antibiotics.  You now feel better but desire testing to make sure bacteria infection resolved.  So I want you to eat healthy but stop proton pump inhibitor.  Then come in for H. pylori testing in 1 month.  You will need to schedule appointment in advance.  For skin lesions/moles, I am going to refer you to a dermatologist.  For your swollen right second digit.  It is difficult to say what injury you suffer.  You might have suffered just a jammed injury or maybe even a fracture.  But adequate time has passed that the area should be healed.  He declined x-ray today.  The area might decrease in swelling over the next months.  But it might not return to normal size.  For your finger warts, I recommend that you get over-the-counter cryo freeze and freeze areas every 7 days for 3 weeks.  Then wait a couple weeks and see if the warts are resolved.  If not then we could refer you to dermatologist.  For recent nasal congestion and dry cough, I think this might be related to allergies/dust exposure.  I prescribed Flonase and benzonatate.  I did put an x-ray order since she reports some occasional left lower rib region pain with cough.  I will make sure you do not have any walking pneumonia.  Follow-up as regular scheduled with PCP or as needed.

## 2017-11-25 NOTE — Progress Notes (Signed)
Subjective:    Patient ID: [name], female    DOB: 30-Nov-1965, 52 y.o.   MRN: [id number]  HPI  Pt in for follow up.  Pt has history of h pylori. She saw GI who eventually referred to ID. Pt was placed on amoxicillin, omeprozole. ID told her to take 1000 mg tid. Pt instead used 500 mg tid due to he concern for overdose. She just finished antibiotic.    Pt had resistance to antibiotic treatment initially. So she was referred to GI. Then they referred to ID.   Pt just finished the amoxicilin. Pt appetite increased, abdomen pain resolved and she has gained weight.   Gained back about 5 pounds.  Pt finished omeprazole one week ago.  One slammed rt index finger in door. Hard stiff finger at dip joint. About one month ago.   Also last week some nasal congestion and cough. No fever, no chills or sweats. Mentioned some mild left lower rib pain. But no sob,no wheezing. No chest pain, and no report of skin rash.  Also expresses concern for lower back moles that she expresses are beginning to itch.   Review of Systems  Constitutional: Negative for chills, fatigue and fever.  Respiratory: Negative for cough, chest tightness, shortness of breath and wheezing.   Cardiovascular: Negative for chest pain and palpitations.  Gastrointestinal: Negative for abdominal distention and anal bleeding.       Stomach feels better now.  Musculoskeletal: Negative for back pain.  Neurological: Negative for dizziness, seizures, syncope and headaches.  Hematological: Negative for adenopathy. Does not bruise/bleed easily.  Psychiatric/Behavioral: Negative for behavioral problems, confusion, dysphoric mood, sleep disturbance and suicidal ideas. The patient is not nervous/anxious.     Past Medical History:  Diagnosis Date  . Allergic rhinitis   . Anemia   . Anxiety   . Asthma   . Contact dermatitis and eczema   . Diverticulitis    CT Scan  . Esophageal reflux   . H. pylori infection   .  Hyperlipidemia, mixed   . IBS (irritable bowel syndrome)   . IC (interstitial cystitis)   . Internal hemorrhoids   . Lumbago   . Mitral valve disorders(424.0)   . Osteoarthritis 07/08/2015  . Post concussive syndrome      Social History   Socioeconomic History  . Marital status: Married    Spouse name: Not on file  . Number of children: 2  . Years of education: Masters  . Highest education level: Not on file  Occupational History  . Occupation: [employer]  Social Needs  . Financial resource strain: Not on file  . Food insecurity:    Worry: Not on file    Inability: Not on file  . Transportation needs:    Medical: Not on file    Non-medical: Not on file  Tobacco Use  . Smoking status: Never Smoker  . Smokeless tobacco: Never Used  Substance and Sexual Activity  . Alcohol use: Yes    Comment: Holidays only  . Drug use: No  . 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 Physical activity:    Days per week: Not on file    Minutes per session: Not on file  . Stress: Not on file  Relationships  . Social connections:    Talks on phone: Not on file    Gets together: Not on  file    Attends religious service: Not on file    Active member of club or organization: Not on file    Attends meetings of clubs or organizations: Not on file    Relationship status: Not on file  . Intimate partner violence:    Fear of current or ex partner: Not on file    Emotionally abused: Not on file    Physically abused: Not on file    Forced sexual activity: Not on file  Other Topics Concern  . Not on file  Social History Narrative   Lives at home home with husband and son.   Right-handed.   1 cup caffeine daily.    Past Surgical History:  Procedure Laterality Date  . BARTHOLIN CYST MARSUPIALIZATION N/A 12/18/2013   Procedure: BARTHOLIN CYST MARSUPIALIZATION WITH BIOPSY;  Surgeon:  Lovenia Kim, MD;  Location: [facility];  Service: Gynecology;  Laterality: N/A;  . CESAREAN SECTION    . DILITATION & CURRETTAGE/HYSTROSCOPY WITH NOVASURE ABLATION N/A 10/14/2012   Procedure: DILATATION & CURETTAGE/HYSTEROSCOPY WITH NOVASURE ABLATION;  Surgeon: Lovenia Kim, MD;  Location: [facility];  Service: Gynecology;  Laterality: N/A;  . MOUTH SURGERY    . SINUS SURGERY WITH INSTATRAK      Family History  Problem Relation Age of Onset  . Colon polyps Sister   . Mental illness Sister        anxiety from 9/11 survivors  . Varicose Veins Sister   . Diabetes Mother   . Hypertension Mother   . Heart attack Mother        MI at age 28  . Alcohol abuse Mother        smoker  . Heart disease Mother   . Dementia Mother   . Hypertension Father   . Hyperlipidemia Father   . Heart attack Father   . Cancer Father        lung cancer, smoker  . Cancer Brother        acute leukemia  . Dementia Maternal Grandmother   . Heart disease Maternal Grandfather        mi  . Cancer Paternal Grandmother        GYN cancer  . Other Sister        hypoglycemia  . Varicose Veins Sister   . Asthma Sister        rheumatoid  . Fibromyalgia Sister   . Mental retardation Sister        depression  . GER disease Son   . Alcohol abuse Unknown        Family history  . Colon cancer Neg Hx   . Breast cancer Neg Hx     Allergies  Allergen Reactions  . Amoxicillin Shortness Of Breath    Doesn't remember rxn  . Aspirin Shortness Of Breath    "irritated stomach" Ibuprofen and Naproxen do not agree with her either  . Codeine Hives and Shortness Of Breath    Does not take percocet or hydrocodone; tylenol only  . Latex Shortness Of Breath and Itching  . Other Shortness Of Breath    peanuts  . Sulfa Antibiotics Hives and Shortness Of Breath  . Sulfamethoxazole-Trimethoprim Shortness Of Breath  . Sulfonamide Derivatives Shortness Of Breath and Itching  . Sulphadimidine Sodium [Sulfamethazine Sodium]  Shortness Of Breath    Added to food for preservative  . Ciprofloxacin Other (See Comments)    REACTION: SOB, "Tightness in head" REACTION: SOB, "Tightness in head" Syncope   .  Azithromycin Other (See Comments)    Faintness  . Carisoprodol   . Clarithromycin     "Doesn't agree with me"  . Epinephrine     Heart racing  . Metronidazole Other (See Comments)    "cannot tolerate"  . Nsaids   . Penicillins     Doesn't remember reaction  . Tolmetin     Current Outpatient Medications on File Prior to Visit  Medication Sig Dispense Refill  . ALPRAZolam (XANAX) 0.25 MG tablet Take 1/2 or 1 every 12 hours as needed for anxiety 20 tablet 0  . fluticasone (FLONASE) 50 MCG/ACT nasal spray Place 2 sprays into both nostrils daily. 16 g 1  . Probiotic Product (PROBIOTIC DAILY PO) Take 1 tablet by mouth daily.    Marland Kitchen omeprazole (PRILOSEC) 40 MG capsule Take 1 capsule (40 mg total) by mouth 2 (two) times daily before a meal for 14 days. 28 capsule 0   No current facility-administered medications on file prior to visit.     BP (!) 111/53   Pulse 88   Temp 98.1 F (36.7 C) (Oral)   Resp 16   Ht 4\' 11"  (1.499 m)   Wt 107 lb 3.2 oz (48.6 kg)   SpO2 98%   BMI 21.65 kg/m       Objective:   Physical Exam   General- No acute distress. Pleasant patient. Neck- Full range of motion, no jvd Lungs- Clear, even and unlabored. Heart- regular rate and rhythm. Neurologic- CNII- XII grossly intact.  Abdomen- soft, non-tender, + bowel sound. No rebound or guarding.  Back- no cva tenderness.  Skin- 9 moderate large moles lower and mid back. Also rt hand 2nd digit wart. 5 th digit small wart.  Rt hand- 2nd digit. Mild swelling of dip joint. No wamth. No tenderness. Good rom.    Assessment & Plan:  You do have history of H. Pylori that was initially resistant to antibiotics.  You now feel better but desire testing to make sure bacteria infection resolved.  So I want you to eat healthy but stop  proton pump inhibitor.  Then come in for H. pylori testing in 1 month.  You will need to schedule appointment in advance.  For skin lesions/moles, I am going to refer you to a dermatologist.  For your swollen right second digit.  It is difficult to say what injury you suffer.  You might have suffered just a jammed injury or maybe even a fracture.  But adequate time has passed that the area should be healed.  He declined x-ray today.  The area might decrease in swelling over the next months.  But it might not return to normal size.  For your finger warts, I recommend that you get over-the-counter cryo freeze and freeze areas every 7 days for 3 weeks.  Then wait a couple weeks and see if the warts are resolved.  If not then we could refer you to dermatologist.  For recent nasal congestion and dry cough, I think this might be related to allergies/dust exposure.  I prescribed Flonase and benzonatate.  I did put an x-ray order since she reports some occasional left lower rib region pain with cough.  I will make sure you do not have any walking pneumonia.  Follow-up as regular scheduled with PCP or as needed.  40 minutes spent with patient. 50% of time counesling pt on her diagnosis today plan/management. Majority of time counseling on h pylori. But also other conditions warts, moles, left  rib pain, allergies and finger injury.

## 2017-12-02 ENCOUNTER — Encounter: Payer: Self-pay | Admitting: Family Medicine

## 2017-12-02 ENCOUNTER — Encounter

## 2017-12-02 ENCOUNTER — Telehealth: Payer: Self-pay | Admitting: Family Medicine

## 2017-12-02 ENCOUNTER — Ambulatory Visit: Payer: 59 | Admitting: Family Medicine

## 2017-12-02 VITALS — BP 120/76 | HR 76 | Temp 97.7°F | Ht 59.0 in | Wt 105.2 lb

## 2017-12-02 DIAGNOSIS — W57XXXA Bitten or stung by nonvenomous insect and other nonvenomous arthropods, initial encounter: Secondary | ICD-10-CM

## 2017-12-02 DIAGNOSIS — S70361A Insect bite (nonvenomous), right thigh, initial encounter: Secondary | ICD-10-CM

## 2017-12-02 DIAGNOSIS — Z13220 Encounter for screening for lipoid disorders: Secondary | ICD-10-CM

## 2017-12-02 DIAGNOSIS — D649 Anemia, unspecified: Secondary | ICD-10-CM

## 2017-12-02 DIAGNOSIS — R109 Unspecified abdominal pain: Secondary | ICD-10-CM

## 2017-12-02 DIAGNOSIS — J069 Acute upper respiratory infection, unspecified: Secondary | ICD-10-CM | POA: Diagnosis not present

## 2017-12-02 DIAGNOSIS — Z130 Encounter for screening for diseases of the blood and blood-forming organs and certain disorders involving the immune mechanism: Secondary | ICD-10-CM

## 2017-12-02 DIAGNOSIS — R21 Rash and other nonspecific skin eruption: Secondary | ICD-10-CM | POA: Diagnosis not present

## 2017-12-02 DIAGNOSIS — Z131 Encounter for screening for diabetes mellitus: Secondary | ICD-10-CM

## 2017-12-02 DIAGNOSIS — E785 Hyperlipidemia, unspecified: Secondary | ICD-10-CM

## 2017-12-02 NOTE — Telephone Encounter (Signed)
Left message to return call. Ok for pec to discuss.  

## 2017-12-02 NOTE — Telephone Encounter (Signed)
Dr. Lorelei Pont patient   Please advise

## 2017-12-02 NOTE — Progress Notes (Signed)
HPI:  Using dictation device. Unfortunately this device frequently misinterprets words/phrases.  Acute visit for rash: -went outside to walk dog with skirt on yesterday - ? mosquito bites times 2 on leg - she scratched a lot and now red - pruritis better after applying hct cr -reports has had this same rash when she scratched lesions in the past -no rash elsewhere  URI: -this past week -[place], ear pressure, mild tightness upper chest mild cough -she has been on high doses of augmentin for h. Pylori and has been very anxkious about this -no fever, chills, SOB currently, NV  ROS: See pertinent positives and negatives per HPI.  Past Medical History:  Diagnosis Date  . Allergic rhinitis   . Anemia   . Anxiety   . Asthma   . Contact dermatitis and eczema   . Diverticulitis    CT Scan  . Esophageal reflux   . H. pylori infection   . Hyperlipidemia, mixed   . IBS (irritable bowel syndrome)   . IC (interstitial cystitis)   . Internal hemorrhoids   . Lumbago   . Mitral valve disorders(424.0)   . Osteoarthritis 07/08/2015  . Post concussive syndrome     Past Surgical History:  Procedure Laterality Date  . BARTHOLIN CYST MARSUPIALIZATION N/A 12/18/2013   Procedure: BARTHOLIN CYST MARSUPIALIZATION WITH BIOPSY;  Surgeon: Lovenia Kim, MD;  Location: [facility];  Service: Gynecology;  Laterality: N/A;  . CESAREAN SECTION    . DILITATION & CURRETTAGE/HYSTROSCOPY WITH NOVASURE ABLATION N/A 10/14/2012   Procedure: DILATATION & CURETTAGE/HYSTEROSCOPY WITH NOVASURE ABLATION;  Surgeon: Lovenia Kim, MD;  Location: [facility];  Service: Gynecology;  Laterality: N/A;  . MOUTH SURGERY    . SINUS SURGERY WITH INSTATRAK      Family History  Problem Relation Age of Onset  . Colon polyps Sister   . Mental illness Sister        anxiety from 9/11 survivors  . Varicose Veins Sister   . Diabetes Mother   . Hypertension Mother   . Heart attack Mother        MI at age 34  . Alcohol abuse Mother         smoker  . Heart disease Mother   . Dementia Mother   . Hypertension Father   . Hyperlipidemia Father   . Heart attack Father   . Cancer Father        lung cancer, smoker  . Cancer Brother        acute leukemia  . Dementia Maternal Grandmother   . Heart disease Maternal Grandfather        mi  . Cancer Paternal Grandmother        GYN cancer  . Other Sister        hypoglycemia  . Varicose Veins Sister   . Asthma Sister        rheumatoid  . Fibromyalgia Sister   . Mental retardation Sister        depression  . GER disease Son   . Alcohol abuse Unknown        Family history  . Colon cancer Neg Hx   . Breast cancer Neg Hx     SOCIAL HX: see hpi   Current Outpatient Medications:  .  benzonatate (TESSALON) 100 MG capsule, Take 1 capsule (100 mg total) by mouth 3 (three) times daily as needed for cough., Disp: 30 capsule, Rfl: 0 .  fluticasone (FLONASE) 50 MCG/ACT nasal spray, Place 2  sprays into both nostrils daily., Disp: 16 g, Rfl: 1 .  OVER THE COUNTER MEDICATION, Calcium, Disp: , Rfl:  .  Probiotic Product (PROBIOTIC DAILY PO), Take 1 tablet by mouth daily., Disp: , Rfl:  .  omeprazole (PRILOSEC) 40 MG capsule, Take 1 capsule (40 mg total) by mouth 2 (two) times daily before a meal for 14 days., Disp: 28 capsule, Rfl: 0  EXAM:  Vitals:   12/02/17 1544  BP: 120/76  Pulse: 76  Temp: 97.7 F (36.5 C)  SpO2: 98%    Body mass index is 21.25 kg/m.  GENERAL: vitals reviewed and listed above, alert, oriented, appears well hydrated and in no acute distress  HEENT: atraumatic, conjunttiva clear, no obvious abnormalities on inspection of external nose and ears, normal appearance of ear canals and TMs except clear effusion R, clear nasal congestion, mild post oropharyngeal erythema with PND, no tonsillar edema or exudate, no sinus TTP  NECK: no obvious masses on inspection  LUNGS: clear to auscultation bilaterally, no wheezes, rales or rhonchi, good air  movement  CV: HRRR, no peripheral edema  SKIN: 2 small erythematous papules on the R thigh, each with some surrounding excoriations  MS: moves all extremities without noticeable abnormality  PSYCH: pleasant and cooperative, no obvious depression or anxiety  ASSESSMENT AND PLAN:  Discussed the following assessment and plan:  Insect bite of right thigh, initial encounter Skin rash -it looks like she has had some sub cutaneous capillary trauma from scratching the two small insect bites  -advised topical benadryl   Upper respiratory infection, viral -not likely bacterial given on abx/augmentin -symptomatic care -Patient advised to return or notify a doctor immediately if symptoms worsen or persist or new concerns arise.  Patient Instructions  Topical benadryl for the itching. Try not to scratch. Wear bug spray before going outside - the buzz off natural spray seems to work pretty well.  Throat lozenges, warm tea, nasal saline for the cold.   I hope you are feeling better soon! Seek care promptly if your symptoms worsen, new concerns arise or you are not improving with treatment.     Lucretia Kern, DO

## 2017-12-02 NOTE — Telephone Encounter (Signed)
Let's confirm whom patient wants to see as her PCP and if she wants to see [place] does she want to do lab work prior to visit?

## 2017-12-02 NOTE — Telephone Encounter (Signed)
Copied from [place] [phone]. Topic: Inquiry >> Dec 02, 2017 11:46 AM Mylinda Latina, NT wrote: Reason for CRM: patient called and states that she needs an order for labs. She is coming in on 12/28/17 for a lab and also for her CPE . She is wondering if Dr. Charlett Blake can put her CPE orders in so she can get it done at her 4:00 lab appt on 12/28/17. Thank you

## 2017-12-02 NOTE — Patient Instructions (Addendum)
Topical benadryl for the itching. Try not to scratch. Wear bug spray before going outside - the buzz off natural spray seems to work pretty well.  Throat lozenges, warm tea, nasal saline for the cold.   I hope you are feeling better soon! Seek care promptly if your symptoms worsen, new concerns arise or you are not improving with treatment.

## 2017-12-02 NOTE — Telephone Encounter (Signed)
Hi Princess- this pt did used to see Dr. Jacinto Reap, at some point she started seeing me but has a CPE scheduled with Dr. Jacinto Reap in September.  She has a lab visit scheduled for 15 minutes before her CPE appt for some reason?

## 2017-12-03 NOTE — Telephone Encounter (Signed)
Please advise 

## 2017-12-03 NOTE — Telephone Encounter (Signed)
Pt states that Dr. Charlett Blake has always been her PCP and she has only been seen with Dr. Lorelei Pont in Dr. Rhae Lerner absence. She is unsure why this was changed on her chart. I am going to change her PCP on her chart to help clear up the confusion. She also states she would like to do her labs prior to her visit.

## 2017-12-03 NOTE — Telephone Encounter (Signed)
Can order labs prior to visit. Lipid, cmp cbc and tsh for hyperlipidemia, anemia, abdominal pain

## 2017-12-06 NOTE — Addendum Note (Signed)
Addended by: [name] A on: 12/06/2017 03:46 PM   Modules accepted: Orders

## 2017-12-06 NOTE — Telephone Encounter (Signed)
Orders placed  Called patient unable to leave voicemail

## 2017-12-13 ENCOUNTER — Encounter: Payer: Self-pay | Admitting: Internal Medicine

## 2017-12-13 ENCOUNTER — Ambulatory Visit: Payer: 59 | Admitting: Internal Medicine

## 2017-12-13 VITALS — BP 110/64 | HR 83 | Ht 59.0 in | Wt 104.6 lb

## 2017-12-13 DIAGNOSIS — J45901 Unspecified asthma with (acute) exacerbation: Secondary | ICD-10-CM

## 2017-12-13 DIAGNOSIS — J4520 Mild intermittent asthma, uncomplicated: Secondary | ICD-10-CM | POA: Diagnosis not present

## 2017-12-13 DIAGNOSIS — R0981 Nasal congestion: Secondary | ICD-10-CM | POA: Diagnosis not present

## 2017-12-13 MED ORDER — LEVALBUTEROL HCL 0.63 MG/3ML IN NEBU
0.6300 mg | INHALATION_SOLUTION | Freq: Once | RESPIRATORY_TRACT | Status: AC
  Administered 2017-12-13: 0.63 mg via RESPIRATORY_TRACT

## 2017-12-13 NOTE — Progress Notes (Signed)
Patient ID: [name], female    DOB: 07/09/1965, 52 y.o.   MRN: [id number]  HPI female never smoker followed for allergic rhinitis, asthma. Complicated by History anxiety, GERD, IBS Office Spirometry 08/01/2015-within normal. FVC 3.02/112%, FEV1 2.36/104%, FEV1/FVC 0.78, FEF 25-75 percent 2.26/84%. PFT 11/26/2010 within normal limits within significant response to bronchodilator. FEV1/FVC 0.79  -------------------------------------------------------------------------------- 12/07/16- 52 year old female never smoker followed for allergic rhinitis, asthma. History anxiety FOLLOWS FOR: Pt states she is having increased heaviness with slight SOB, Left ear feels clogged-ENT gave clear bill of health.  Complains of 6 months of intermittent discomfort with left ear pressure, nasal congestion, drainage, some dry cough and chest tightness. Denies fever, wheeze, purulent discharge, adenopathy. Has had a lot of dental work on a left upper molar.  12/13/2017-  52 year old female never smoker followed for allergic rhinitis, asthma.  Complicated by GERD, history anxiety, IBS -----asthma: 12 month follow up. Per patient, she has some increased chest congestion for the past few weeks.  She had taken amoxicillin for H.pylori management and describes side effects including hyperventilation but could be consistent with her self-admitted chronic anxiety, but did not sound like an allergic reaction.  Since that treatment she has had some increased cough and postnasal drip with scant clear mucus over the last couple of weeks.  She describes mold exposure in her workplace.  Employer has installed an air cleaner but she is interested in getting a small one for her desk space as well.  We also discussed the current ragweed pollen season and potential for air quality problems at this time of year as additional factors that might affect her breathing.  She has not had active wheezing. CXR 11/25/2017- IMPRESSION: No  acute abnormalities.   Review of Systems-see HPI     + = positive Constitutional:   No-   weight loss, night sweats, fevers, chills, fatigue, lassitude. HEENT:    headaches, difficulty swallowing, tooth/dental problems, sore throat,       No-  sneezing, itching, ear ache, nasal congestion, +post nasal drip,  CV:  No-   chest pain, orthopnea, PND, swelling in lower extremities, anasarca, dizziness, palpitations Resp: shortness of breath with exertion or at rest.                non-productive cough,  No- coughing up of blood.                 change in color of mucus.  No recent  wheezing.   Skin: No-   rash or lesions. GI:  No-   heartburn, indigestion, abdominal pain, nausea, vomiting, GU:  MS:  No-   joint pain or swelling.   Neuro-     nothing unusual Psych:  No- change in mood or affect. No depression + anxiety.  No memory loss.  Objective:   Physical Exam General- Alert, Oriented, Affect-anxious/ talkative, Distress- none acute, trim, petite woman    Skin- rash-none, lesions- none, excoriation- none Lymphadenopathy- none Head- atraumatic            Eyes- Gross vision intact, PERRLA, conjunctivae clear secretions            Ears- Hearing, canals normal. +Scarring or sclerosis TMs            Nose- +Narrow with turbinate edema, clear mucus bridging, No- Septal dev, polyps, erosion, perforation             Throat- Mallampati III , mucosa clear , drainage- none, tonsils- atrophic Neck-  flexible , trachea midline, no stridor , thyroid nl, carotid no bruit Chest - symmetrical excursion , unlabored           Heart/CV- RRR , no murmur , no gallop  , no rub, nl s1 s2                           - JVD- none , edema- none, stasis changes- none, varices- none           Lung- clear to P&A, wheeze- none, cough- none , dullness-none, rub- none           Chest wall-  Abd-  Br/ Gen/ Rectal- Not done, not indicated Extrem- cyanosis- none, clubbing, none, atrophy- none, strength- nl Neuro- grossly  intact to observation

## 2017-12-13 NOTE — Patient Instructions (Addendum)
Order- neb xop 0.63    Dx acute bronchitis  Suggest you look for small HEPA type air cleaner for your work space   Please call if we can help

## 2017-12-14 NOTE — Assessment & Plan Note (Signed)
Describes minimal clear rhinorhea. A non-sedating antihistamine like claritin would be sufficient.

## 2017-12-14 NOTE — Assessment & Plan Note (Signed)
She has felt a mild exacerbation of airway irritability over the last few weeks that could be related to mold exposures in her workplace, outdoor air quality, ragweed season pollen, or something else.  It almost certainly is not related to the amoxicillin she took for treatment of H. Pylori. Plan-we discussed HEPA filters.  Xopenex nebulizer treatment for therapeutic trial.

## 2017-12-18 DIAGNOSIS — B078 Other viral warts: Secondary | ICD-10-CM | POA: Diagnosis not present

## 2017-12-18 DIAGNOSIS — D225 Melanocytic nevi of trunk: Secondary | ICD-10-CM | POA: Diagnosis not present

## 2017-12-28 ENCOUNTER — Encounter: Payer: Self-pay | Admitting: Family Medicine

## 2017-12-28 ENCOUNTER — Other Ambulatory Visit: Payer: 59

## 2017-12-28 ENCOUNTER — Ambulatory Visit (INDEPENDENT_AMBULATORY_CARE_PROVIDER_SITE_OTHER): Payer: 59 | Admitting: Family Medicine

## 2017-12-28 VITALS — BP 102/68 | HR 86 | Temp 98.4°F | Resp 18 | Ht 59.0 in | Wt 105.4 lb

## 2017-12-28 DIAGNOSIS — Z0000 Encounter for general adult medical examination without abnormal findings: Secondary | ICD-10-CM

## 2017-12-28 DIAGNOSIS — D649 Anemia, unspecified: Secondary | ICD-10-CM

## 2017-12-28 DIAGNOSIS — Z23 Encounter for immunization: Secondary | ICD-10-CM

## 2017-12-28 DIAGNOSIS — R109 Unspecified abdominal pain: Secondary | ICD-10-CM

## 2017-12-28 DIAGNOSIS — N951 Menopausal and female climacteric states: Secondary | ICD-10-CM

## 2017-12-28 DIAGNOSIS — Z8619 Personal history of other infectious and parasitic diseases: Secondary | ICD-10-CM | POA: Insufficient documentation

## 2017-12-28 DIAGNOSIS — K581 Irritable bowel syndrome with constipation: Secondary | ICD-10-CM

## 2017-12-28 DIAGNOSIS — Z0001 Encounter for general adult medical examination with abnormal findings: Secondary | ICD-10-CM

## 2017-12-28 DIAGNOSIS — M1990 Unspecified osteoarthritis, unspecified site: Secondary | ICD-10-CM

## 2017-12-28 DIAGNOSIS — R350 Frequency of micturition: Secondary | ICD-10-CM

## 2017-12-28 DIAGNOSIS — E785 Hyperlipidemia, unspecified: Secondary | ICD-10-CM

## 2017-12-28 NOTE — Patient Instructions (Addendum)
Systane eye drops  Witch Hazel Astringent and Miracle Oil cream Neutrogena T gel shampoo use roughly 2 x a week  Shingrix is the new shingles shot 2 shots over 2-6 months, call insurance regarding coverage if they cover. Write down the name of person spoken with and call for nurse appt to get the shot.  For the hot flashes small frequent meals with lean proteins and limited carbs, complex, eat every 4-5 hours, 64 to 84 oz of clear fluids daily  Preventive Care 40-64 Years, Female Preventive care refers to lifestyle choices and visits with your health care provider that can promote health and wellness. What does preventive care include?  A yearly physical exam. This is also called an annual well check.  Dental exams once or twice a year.  Routine eye exams. Ask your health care provider how often you should have your eyes checked.  Personal lifestyle choices, including: ? Daily care of your teeth and gums. ? Regular physical activity. ? Eating a healthy diet. ? Avoiding tobacco and drug use. ? Limiting alcohol use. ? Practicing safe sex. ? Taking low-dose aspirin daily starting at age 50. ? Taking vitamin and mineral supplements as recommended by your health care provider. What happens during an annual well check? The services and screenings done by your health care provider during your annual well check will depend on your age, overall health, lifestyle risk factors, and family history of disease. Counseling Your health care provider may ask you questions about your:  Alcohol use.  Tobacco use.  Drug use.  Emotional well-being.  Home and relationship well-being.  Sexual activity.  Eating habits.  Work and work Statistician.  Method of birth control.  Menstrual cycle.  Pregnancy history.  Screening You may have the following tests or measurements:  Height, weight, and BMI.  Blood pressure.  Lipid and cholesterol levels. These may be checked every 5 years,  or more frequently if you are over 26 years old.  Skin check.  Lung cancer screening. You may have this screening every year starting at age 65 if you have a 30-pack-year history of smoking and currently smoke or have quit within the past 15 years.  Fecal occult blood test (FOBT) of the stool. You may have this test every year starting at age 31.  Flexible sigmoidoscopy or colonoscopy. You may have a sigmoidoscopy every 5 years or a colonoscopy every 10 years starting at age 11.  Hepatitis C blood test.  Hepatitis B blood test.  Sexually transmitted disease (STD) testing.  Diabetes screening. This is done by checking your blood sugar (glucose) after you have not eaten for a while (fasting). You may have this done every 1-3 years.  Mammogram. This may be done every 1-2 years. Talk to your health care provider about when you should start having regular mammograms. This may depend on whether you have a family history of breast cancer.  BRCA-related cancer screening. This may be done if you have a family history of breast, ovarian, tubal, or peritoneal cancers.  Pelvic exam and Pap test. This may be done every 3 years starting at age 64. Starting at age 39, this may be done every 5 years if you have a Pap test in combination with an HPV test.  Bone density scan. This is done to screen for osteoporosis. You may have this scan if you are at high risk for osteoporosis.  Discuss your test results, treatment options, and if necessary, the need for more tests with  your health care provider. Vaccines Your health care provider may recommend certain vaccines, such as:  Influenza vaccine. This is recommended every year.  Tetanus, diphtheria, and acellular pertussis (Tdap, Td) vaccine. You may need a Td booster every 10 years.  Varicella vaccine. You may need this if you have not been vaccinated.  Zoster vaccine. You may need this after age 109.  Measles, mumps, and rubella (MMR) vaccine. You  may need at least one dose of MMR if you were born in 1957 or later. You may also need a second dose.  Pneumococcal 13-valent conjugate (PCV13) vaccine. You may need this if you have certain conditions and were not previously vaccinated.  Pneumococcal polysaccharide (PPSV23) vaccine. You may need one or two doses if you smoke cigarettes or if you have certain conditions.  Meningococcal vaccine. You may need this if you have certain conditions.  Hepatitis A vaccine. You may need this if you have certain conditions or if you travel or work in places where you may be exposed to hepatitis A.  Hepatitis B vaccine. You may need this if you have certain conditions or if you travel or work in places where you may be exposed to hepatitis B.  Haemophilus influenzae type b (Hib) vaccine. You may need this if you have certain conditions.  Talk to your health care provider about which screenings and vaccines you need and how often you need them. This information is not intended to replace advice given to you by your health care provider. Make sure you discuss any questions you have with your health care provider. Document Released: 05/10/2015 Document Revised: 01/01/2016 Document Reviewed: 02/12/2015 Elsevier Interactive Patient Education  [employer].

## 2017-12-28 NOTE — Assessment & Plan Note (Signed)
Some trouble with stiffness in hands.

## 2017-12-29 LAB — CBC
HEMATOCRIT: 39 % (ref 36.0–46.0)
Hemoglobin: 13.7 g/dL (ref 12.0–15.0)
MCHC: 35 g/dL (ref 30.0–36.0)
MCV: 95.9 fl (ref 78.0–100.0)
PLATELETS: 287 10*3/uL (ref 150.0–400.0)
RBC: 4.07 Mil/uL (ref 3.87–5.11)
RDW: 11.9 % (ref 11.5–15.5)
WBC: 4.1 10*3/uL (ref 4.0–10.5)

## 2017-12-29 LAB — LIPID PANEL
Cholesterol: 205 mg/dL — ABNORMAL HIGH (ref 0–200)
HDL: 47.7 mg/dL (ref >39.00)
LDL Cholesterol: 123 mg/dL — ABNORMAL HIGH (ref 0–99)
NonHDL: 157.73
TRIGLYCERIDES: 176 mg/dL — AB (ref 0.0–149.0)
Total CHOL/HDL Ratio: 4
VLDL: 35.2 mg/dL (ref 0.0–40.0)

## 2017-12-29 LAB — COMPREHENSIVE METABOLIC PANEL
ALT: 10 U/L (ref 0–35)
AST: 16 U/L (ref 0–37)
Albumin: 4.1 g/dL (ref 3.5–5.2)
Alkaline Phosphatase: 75 U/L (ref 39–117)
BILIRUBIN TOTAL: 0.5 mg/dL (ref 0.2–1.2)
BUN: 14 mg/dL (ref 6–23)
CALCIUM: 9.7 mg/dL (ref 8.4–10.5)
CO2: 32 mEq/L (ref 19–32)
Chloride: 103 mEq/L (ref 96–112)
Creatinine, Ser: 0.76 mg/dL (ref 0.40–1.20)
GFR: 84.71 mL/min (ref >60.00)
Glucose, Bld: 121 mg/dL — ABNORMAL HIGH (ref 70–99)
Potassium: 4.2 mEq/L (ref 3.5–5.1)
Sodium: 140 mEq/L (ref 135–145)
Total Protein: 6.8 g/dL (ref 6.0–8.3)

## 2017-12-29 LAB — H. PYLORI BREATH TEST: H. PYLORI BREATH TEST: DETECTED — AB

## 2017-12-29 LAB — TSH: TSH: 1.8 u[IU]/mL (ref 0.35–4.50)

## 2017-12-30 ENCOUNTER — Telehealth: Payer: Self-pay

## 2017-12-30 NOTE — Telephone Encounter (Signed)
I use both and they both do good work. She can see either and be well cared for

## 2017-12-30 NOTE — Telephone Encounter (Signed)
Copied from [place] [id number]. Topic: General - Other >> Dec 30, 2017 12:17 PM Marin Olp L wrote: Reason for CRM: Wants to know if Dr. Randel Pigg recommends Dr. Allen Norris (Gi) over [facility]? Please advise.

## 2017-12-31 MED ORDER — FOLIC ACID 1 MG PO TABS: 1.0000 mg | ORAL_TABLET | Freq: Every day | ORAL | 3 refills | Status: DC

## 2017-12-31 NOTE — Addendum Note (Signed)
Addended by: [name] A on: 12/31/2017 04:19 PM   Modules accepted: Orders

## 2017-12-31 NOTE — Telephone Encounter (Signed)
Yes please direct referral to Dr Wallis Mart patient preference

## 2017-12-31 NOTE — Telephone Encounter (Signed)
Called patient left message on her answering machione. Advised if she had any further questions to call the office.

## 2017-12-31 NOTE — Telephone Encounter (Signed)
Pt called and stated that [facility] will not accept her. Patient would like a call back. Please advise. Dr Herbie Baltimore buccini is who she would like to be referred to.

## 2017-12-31 NOTE — Telephone Encounter (Signed)
Gwen Will you send the referral to the physician patient preference

## 2018-01-02 DIAGNOSIS — N951 Menopausal and female climacteric states: Secondary | ICD-10-CM | POA: Insufficient documentation

## 2018-01-02 NOTE — Assessment & Plan Note (Signed)
Encouraged heart healthy diet, increase exercise, avoid trans fats, consider a krill oil cap daily 

## 2018-01-02 NOTE — Assessment & Plan Note (Signed)
Patient encouraged to maintain heart healthy diet, regular exercise, adequate sleep. Consider daily probiotics. Take medications as prescribed 

## 2018-01-02 NOTE — Assessment & Plan Note (Signed)
Encouraged small, frequent meals with lean proteins and minimal simple carbs. Get regular exercise, sleep and minimize caffeine and alcohol. Consider SSRIif persists.

## 2018-01-02 NOTE — Progress Notes (Signed)
Subjective:    Patient ID: [name], female    DOB: 1965-06-01, 52 y.o.   MRN: [id number]  No chief complaint on file.   HPI Patient is in today for annual preventative exam and follow pu on chronic medical concerns including hyperlipidemia, abdominal pain, H Pylori and more. She is noting some intermittent abdominal pain and dyspepsia still. No bloody or tarry stool. No recent febile illness o hospitalizations. She notes her poor appetite and GI symptoms did improve with treatment of the H Pylori but did not fully resolve. Denies CP/palp/SOB/HA/congestion/fevers or GU c/o. Taking meds as prescribed. She is staying active and maintaining a heat healthy diet. Is managing activities of daily living well.   Past Medical History:  Diagnosis Date  . Allergic rhinitis   . Anemia   . Anxiety   . Asthma   . Contact dermatitis and eczema   . Diverticulitis    CT Scan  . Esophageal reflux   . H. pylori infection   . Hyperlipidemia, mixed   . IBS (irritable bowel syndrome)   . IC (interstitial cystitis)   . Internal hemorrhoids   . Lumbago   . Mitral valve disorders(424.0)   . Osteoarthritis 07/08/2015  . Post concussive syndrome     Past Surgical History:  Procedure Laterality Date  . BARTHOLIN CYST MARSUPIALIZATION N/A 12/18/2013   Procedure: BARTHOLIN CYST MARSUPIALIZATION WITH BIOPSY;  Surgeon: Lovenia Kim, MD;  Location: [facility];  Service: Gynecology;  Laterality: N/A;  . CESAREAN SECTION    . DILITATION & CURRETTAGE/HYSTROSCOPY WITH NOVASURE ABLATION N/A 10/14/2012   Procedure: DILATATION & CURETTAGE/HYSTEROSCOPY WITH NOVASURE ABLATION;  Surgeon: Lovenia Kim, MD;  Location: [facility];  Service: Gynecology;  Laterality: N/A;  . MOUTH SURGERY    . SINUS SURGERY WITH INSTATRAK      Family History  Problem Relation Age of Onset  . Colon polyps Sister   . Mental illness Sister        anxiety from 9/11 survivors  . Varicose Veins Sister   . Diabetes Mother   .  Hypertension Mother   . Heart attack Mother        MI at age 61  . Alcohol abuse Mother        smoker  . Heart disease Mother   . Dementia Mother   . Hypertension Father   . Hyperlipidemia Father   . Heart attack Father   . Cancer Father        lung cancer, smoker  . Cancer Brother        acute leukemia  . Dementia Maternal Grandmother   . Heart disease Maternal Grandfather        mi  . Cancer Paternal Grandmother        GYN cancer  . Other Sister        hypoglycemia  . Varicose Veins Sister   . Rheum arthritis Sister   . Pleurisy Sister   . Fibromyalgia Sister   . Mental retardation Sister        depression  . GER disease Son   . Alcohol abuse Unknown        Family history  . Colon cancer Neg Hx   . Breast cancer Neg Hx     Social History   Socioeconomic History  . Marital status: Married    Spouse name: Not on file  . Number of children: 2  . Years of education: Masters  . Highest education level: Not on  file  Occupational History  . Occupation: [employer]  Social Needs  . Financial resource strain: Not on file  . Food insecurity:    Worry: Not on file    Inability: Not on file  . Transportation needs:    Medical: Not on file    Non-medical: Not on file  Tobacco Use  . Smoking status: Never Smoker  . Smokeless tobacco: Never Used  Substance and Sexual Activity  . Alcohol use: Yes    Comment: Holidays only  . Drug use: No  . 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 Physical activity:    Days per week: Not on file    Minutes per session: Not on file  . Stress: Not on file  Relationships  . Social connections:    Talks on phone: Not on file    Gets together: Not on file    Attends religious service: Not on file    Active member of club or organization: Not on file    Attends meetings of clubs or organizations: Not on file    Relationship  status: Not on file  . Intimate partner violence:    Fear of current or ex partner: Not on file    Emotionally abused: Not on file    Physically abused: Not on file    Forced sexual activity: Not on file  Other Topics Concern  . Not on file  Social History Narrative   Lives at home home with husband and son.   Right-handed.   1 cup caffeine daily.    Outpatient Medications Prior to Visit  Medication Sig Dispense Refill  . fluticasone (FLONASE) 50 MCG/ACT nasal spray Place 2 sprays into both nostrils daily. 16 g 1  . Multiple Vitamins-Minerals (MULTIVITAMIN ADULT PO) Take 1 tablet by mouth daily.    Marland Kitchen OVER THE COUNTER MEDICATION Calcium    . Probiotic Product (PROBIOTIC DAILY PO) Take 1 tablet by mouth daily.    Marland Kitchen omeprazole (PRILOSEC) 40 MG capsule Take 1 capsule (40 mg total) by mouth 2 (two) times daily before a meal for 14 days. 28 capsule 0   No facility-administered medications prior to visit.     Allergies  Allergen Reactions  . Amoxicillin Shortness Of Breath    Doesn't remember rxn  . Aspirin Shortness Of Breath    "irritated stomach" Ibuprofen and Naproxen do not agree with her either  . Codeine Hives and Shortness Of Breath    Does not take percocet or hydrocodone; tylenol only  . Latex Shortness Of Breath and Itching  . Other Shortness Of Breath    peanuts  . Sulfa Antibiotics Hives and Shortness Of Breath  . Sulfamethoxazole-Trimethoprim Shortness Of Breath  . Sulfonamide Derivatives Shortness Of Breath and Itching  . Sulphadimidine Sodium [Sulfamethazine Sodium] Shortness Of Breath    Added to food for preservative  . Ciprofloxacin Other (See Comments)    REACTION: SOB, "Tightness in head" REACTION: SOB, "Tightness in head" Syncope   . Azithromycin Other (See Comments)    Faintness  . Carisoprodol   . Clarithromycin     "Doesn't agree with me"  . Epinephrine     Heart racing  . Metronidazole Other (See Comments)    "cannot tolerate"  . Nsaids   .  Penicillins     Doesn't remember reaction  . Tolmetin   . Omeprazole Palpitations  Review of Systems  Constitutional: Negative for chills, fever and malaise/fatigue.  HENT: Negative for congestion and hearing loss.   Eyes: Negative for discharge.  Respiratory: Negative for cough, sputum production and shortness of breath.   Cardiovascular: Negative for chest pain, palpitations and leg swelling.  Gastrointestinal: Positive for abdominal pain and heartburn. Negative for blood in stool, constipation, diarrhea, nausea and vomiting.  Genitourinary: Negative for dysuria, frequency, hematuria and urgency.  Musculoskeletal: Negative for back pain, falls and myalgias.  Skin: Negative for rash.  Neurological: Negative for dizziness, sensory change, loss of consciousness, weakness and headaches.  Endo/Heme/Allergies: Negative for environmental allergies. Does not bruise/bleed easily.  Psychiatric/Behavioral: Negative for depression and suicidal ideas. The patient is nervous/anxious and has insomnia.        Objective:    Physical Exam  Constitutional: She is oriented to person, place, and time. She appears well-developed and well-nourished. No distress.  HENT:  Head: Normocephalic and atraumatic.  Eyes: Conjunctivae are normal.  Neck: Neck supple. No thyromegaly present.  Cardiovascular: Normal rate, regular rhythm and normal heart sounds.  No murmur heard. Pulmonary/Chest: Effort normal and breath sounds normal. No respiratory distress.  Abdominal: Soft. Bowel sounds are normal. She exhibits no distension and no mass. There is no tenderness.  Musculoskeletal: She exhibits no edema.  Lymphadenopathy:    She has no cervical adenopathy.  Neurological: She is alert and oriented to person, place, and time.  Skin: Skin is warm and dry.  Psychiatric: She has a normal mood and affect. Her behavior is normal.    BP 102/68 (BP Location: Left Arm, Patient Position: Sitting, Cuff Size: Normal)    Pulse 86   Temp 98.4 F (36.9 C) (Oral)   Resp 18   Ht 4\' 11"  (1.499 m)   Wt 105 lb 6.4 oz (47.8 kg)   SpO2 98%   BMI 21.29 kg/m  Wt Readings from Last 3 Encounters:  12/28/17 105 lb 6.4 oz (47.8 kg)  12/13/17 104 lb 9.6 oz (47.4 kg)  12/02/17 105 lb 3.2 oz (47.7 kg)     Lab Results  Component Value Date   WBC 4.1 12/28/2017   HGB 13.7 12/28/2017   HCT 39.0 12/28/2017   PLT 287.0 12/28/2017   GLUCOSE 121 (H) 12/28/2017   CHOL 205 (H) 12/28/2017   TRIG 176.0 (H) 12/28/2017   HDL 47.70 12/28/2017   LDLDIRECT 142.7 03/02/2012   LDLCALC 123 (H) 12/28/2017   ALT 10 12/28/2017   AST 16 12/28/2017   NA 140 12/28/2017   K 4.2 12/28/2017   CL 103 12/28/2017   CREATININE 0.76 12/28/2017   BUN 14 12/28/2017   CO2 32 12/28/2017   TSH 1.80 12/28/2017   HGBA1C 4.8 12/12/2010    Lab Results  Component Value Date   TSH 1.80 12/28/2017   Lab Results  Component Value Date   WBC 4.1 12/28/2017   HGB 13.7 12/28/2017   HCT 39.0 12/28/2017   MCV 95.9 12/28/2017   PLT 287.0 12/28/2017   Lab Results  Component Value Date   NA 140 12/28/2017   K 4.2 12/28/2017   CO2 32 12/28/2017   GLUCOSE 121 (H) 12/28/2017   BUN 14 12/28/2017   CREATININE 0.76 12/28/2017   BILITOT 0.5 12/28/2017   ALKPHOS 75 12/28/2017   AST 16 12/28/2017   ALT 10 12/28/2017   PROT 6.8 12/28/2017   ALBUMIN 4.1 12/28/2017   CALCIUM 9.7 12/28/2017   GFR 84.71 12/28/2017   Lab Results  Component Value  Date   CHOL 205 (H) 12/28/2017   Lab Results  Component Value Date   HDL 47.70 12/28/2017   Lab Results  Component Value Date   LDLCALC 123 (H) 12/28/2017   Lab Results  Component Value Date   TRIG 176.0 (H) 12/28/2017   Lab Results  Component Value Date   CHOLHDL 4 12/28/2017   Lab Results  Component Value Date   HGBA1C 4.8 12/12/2010       Assessment & Plan:   Problem List Items Addressed This Visit    Anemia   IRRITABLE BOWEL SYNDROME   Preventative health care    Patient  encouraged to maintain heart healthy diet, regular exercise, adequate sleep. Consider daily probiotics. Take medications as prescribed      Hyperlipidemia, mild    Encouraged heart healthy diet, increase exercise, avoid trans fats, consider a krill oil cap daily      Osteoarthritis    Some trouble with stiffness in hands.       History of Helicobacter pylori infection    Has tested positive will need retreatment and is referred to gastroenterology.       Perimenopausal    Encouraged small, frequent meals with lean proteins and minimal simple carbs. Get regular exercise, sleep and minimize caffeine and alcohol. Consider SSRIif persists.        Other Visit Diagnoses    Needs flu shot    -  Primary   Relevant Orders   Flu Vaccine QUAD 6+ mos PF IM (Fluarix Quad PF) (Completed)   Abdominal pain, unspecified abdominal location       Relevant Orders   Ambulatory referral to Gastroenterology   Hyperlipidemia, unspecified hyperlipidemia type       Urinary frequency       Relevant Orders   Urinalysis   Urine Culture      I have discontinued Mavi Gargis's omeprazole. I am also having her maintain her Probiotic Product (PROBIOTIC DAILY PO), fluticasone, OVER THE COUNTER MEDICATION, and Multiple Vitamins-Minerals (MULTIVITAMIN ADULT PO).  No orders of the defined types were placed in this encounter.    Penni Homans, MD

## 2018-01-02 NOTE — Assessment & Plan Note (Signed)
Has tested positive will need retreatment and is referred to gastroenterology.

## 2018-01-03 ENCOUNTER — Telehealth: Payer: Self-pay

## 2018-01-03 NOTE — Telephone Encounter (Signed)
Total cholesterol only 203. Maintain a heart healthy diet such as DASH or MIND diet increase exercise, avoid trans fats, and processed foods. The one piece of blood work that might help he is a hgba1c since her sugar was up slightly yesterday. Likely insignificant since she was likely not fasting.

## 2018-01-03 NOTE — Telephone Encounter (Signed)
I did all of those labs just last week. We did a lipid panel and a cmp please provide her with copies if she would like we would not do them again so soon insurance would never pay, can repeat in 3 months. No severe concerns or changes needed. There is no test other than a scope done by H Pyloi to evaluate severity . She either has it or not. Does she want [place] to retreat with same regimen as before or a different regimen that lasts 14 days and requires meds 4 x a day? Will change GI referal again to poviders requested.

## 2018-01-03 NOTE — Telephone Encounter (Signed)
Copied from [place] [id number]. Topic: Referral - Request >> Jan 03, 2018  9:06 AM Antonieta Iba C wrote: Reason for CRM:   Pt called in to request referral to be set up with [facility]  Erling Conte) ALSO, pt says that she would like to request lab work to test/find out the severity of her current condition [facility]) pt would also like to have her tetanus shot.   Please assist pt further.   [phone]

## 2018-01-03 NOTE — Telephone Encounter (Signed)
Spoke with patient to get clarification of what she is requesting. States she would like a Tdap which I scheduled. She would like to be referred to Dr. Lizbeth Bark or Dr. Katherine Basset with [facility] physicians (GI). Patient would also like to have a CMP for Glucose , A Lipid Panel for Cholesterol and she requests a "PH Pylori". Scheduled patient for her Tdap if ok with provider and Labs can be drawn on this day as well if approved by provider.

## 2018-01-04 ENCOUNTER — Other Ambulatory Visit: Payer: Self-pay | Admitting: Family Medicine

## 2018-01-04 DIAGNOSIS — Z0000 Encounter for general adult medical examination without abnormal findings: Secondary | ICD-10-CM

## 2018-01-04 NOTE — Telephone Encounter (Signed)
Called patient back. States she will discuss blood testing for H Pylorii with GI and will discuss treatment for it also.

## 2018-01-04 NOTE — Telephone Encounter (Signed)
She can certainly discuss with GI

## 2018-01-04 NOTE — Telephone Encounter (Signed)
There is a blood test but it is not helpful for determining severity of current infection it only confirms that she has had an active case of HP at some point

## 2018-01-04 NOTE — Telephone Encounter (Signed)
Called patient. Lab appointment scheduled for A1c. Patient insists that there is a blood test she can do for H Pylor that will let her know what stage she is in. Patient would like to know if GI will treat her or id this office is. States she cannot take Amoicillin if she is treated by this office.

## 2018-01-12 ENCOUNTER — Ambulatory Visit: Payer: 59

## 2018-01-13 ENCOUNTER — Telehealth: Payer: Self-pay | Admitting: Medical

## 2018-01-13 ENCOUNTER — Encounter: Payer: Self-pay | Admitting: Medical

## 2018-01-13 ENCOUNTER — Ambulatory Visit: Payer: 59 | Admitting: Medical

## 2018-01-13 VITALS — BP 110/68 | HR 71 | Temp 98.0°F | Resp 16 | Ht 59.0 in | Wt 104.8 lb

## 2018-01-13 DIAGNOSIS — J309 Allergic rhinitis, unspecified: Secondary | ICD-10-CM | POA: Diagnosis not present

## 2018-01-13 DIAGNOSIS — Z8619 Personal history of other infectious and parasitic diseases: Secondary | ICD-10-CM | POA: Diagnosis not present

## 2018-01-13 DIAGNOSIS — Z23 Encounter for immunization: Secondary | ICD-10-CM | POA: Diagnosis not present

## 2018-01-13 MED ORDER — FLUTICASONE PROPIONATE 50 MCG/ACT NA SUSP: 2.0000 | Freq: Every day | NASAL | 1 refills | Status: DC

## 2018-01-13 NOTE — Telephone Encounter (Signed)
Opened to review 

## 2018-01-13 NOTE — Telephone Encounter (Signed)
Would you look at referral to [facility]. What is status of that referral? Pt has appointment late this afternoon. And her need to see new GI is the primary concern. If you could find out so I could update her.

## 2018-01-13 NOTE — Patient Instructions (Addendum)
You have had very difficult time finding treatment regimen for H. pylori due to your allergic reaction/sensitivity history.  On review of up-to-date website I found some information regarding Levaquin and amoxicillin regimen.  I think you might be able to tolerate this regimen.  You report having had syncopal type episode shortly after taking Cipro in the past.  But on discussion that seems to be potentially related to dehydration.  Would recommend that you ask the GI MD that you are seeing in October regarding the above potential treatment regimen.  Also recommend that you ask him whether EGD and colonoscopy indicated.  You had question regarding of phototherapy treatment for H. pylori.  You might want to contact integrative therapies in [place] and see if they do that treatment.  If so recommend contacting your insurance to see if it is covered.  You have recent allergic rhinitis symptoms and I will prescribe Flonase nasal spray today.  If your symptoms persist then you might consider adding antihistamine.  Follow up as regularly scheduled with pcp or as needed

## 2018-01-13 NOTE — Progress Notes (Signed)
Subjective:    Patient ID: [name], female    DOB: 10-03-1965, 52 y.o.   MRN: [id number]  HPI   Pt in for follow up.  She got tdap today.  She in for discussion on her h pylori. It has been resistant to various antibiotic treatments. She had various side effects with antibiotics. She has failed some modified regimens.  Failed treatment when Dr. Lorelei Pont prescribed, GI  Prescribed and Infectious disease MD regimen.  She is interested in phototherapy treatment.   Pt has new appointment with new GI  MD Feb 04, 2018 with Dr. Cristina Gong. Dr. Charlett Blake placed that referral.   Pt states no dramatic symptoms presently with Gi illnesses. Minimal upset stomach on occasion.    Review of Systems  Constitutional: Negative for chills, fatigue and fever.  HENT: Positive for congestion and postnasal drip. Negative for mouth sores, nosebleeds, rhinorrhea, sinus pain, sneezing and tinnitus.        5 days ago.  Respiratory: Negative for cough, choking, shortness of breath and wheezing.   Cardiovascular: Negative for chest pain and palpitations.  Gastrointestinal: Negative for abdominal pain, constipation, diarrhea and vomiting.       Mid upset stomach.  Musculoskeletal: Negative for back pain, gait problem, myalgias and neck stiffness.  Skin: Negative for pallor and rash.  Hematological: Negative for adenopathy. Does not bruise/bleed easily.  Psychiatric/Behavioral: Negative for behavioral problems and confusion.   Past Medical History:  Diagnosis Date  . Allergic rhinitis   . Anemia   . Anxiety   . Asthma   . Contact dermatitis and eczema   . Diverticulitis    CT Scan  . Esophageal reflux   . H. pylori infection   . Hyperlipidemia, mixed   . IBS (irritable bowel syndrome)   . IC (interstitial cystitis)   . Internal hemorrhoids   . Lumbago   . Mitral valve disorders(424.0)   . Osteoarthritis 07/08/2015  . Post concussive syndrome      Social History   Socioeconomic History    . Marital status: Married    Spouse name: Not on file  . Number of children: 2  . Years of education: Masters  . Highest education level: Not on file  Occupational History  . Occupation: [employer]  Social Needs  . Financial resource strain: Not on file  . Food insecurity:    Worry: Not on file    Inability: Not on file  . Transportation needs:    Medical: Not on file    Non-medical: Not on file  Tobacco Use  . Smoking status: Never Smoker  . Smokeless tobacco: Never Used  Substance and Sexual Activity  . Alcohol use: Yes    Comment: Holidays only  . Drug use: No  . 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 Physical activity:    Days per week: Not on file    Minutes per session: Not on file  . Stress: Not on file  Relationships  . Social connections:    Talks on phone: Not on file    Gets together: Not on file    Attends religious service: Not on file    Active member of club or organization: Not on file    Attends meetings of clubs or organizations: Not on file    Relationship status: Not on file  . Intimate partner violence:  Fear of current or ex partner: Not on file    Emotionally abused: Not on file    Physically abused: Not on file    Forced sexual activity: Not on file  Other Topics Concern  . Not on file  Social History Narrative   Lives at home home with husband and son.   Right-handed.   1 cup caffeine daily.    Past Surgical History:  Procedure Laterality Date  . BARTHOLIN CYST MARSUPIALIZATION N/A 12/18/2013   Procedure: BARTHOLIN CYST MARSUPIALIZATION WITH BIOPSY;  Surgeon: Lovenia Kim, MD;  Location: [facility];  Service: Gynecology;  Laterality: N/A;  . CESAREAN SECTION    . DILITATION & CURRETTAGE/HYSTROSCOPY WITH NOVASURE ABLATION N/A 10/14/2012   Procedure: DILATATION & CURETTAGE/HYSTEROSCOPY WITH NOVASURE ABLATION;  Surgeon: Lovenia Kim, MD;  Location: [facility];  Service: Gynecology;  Laterality: N/A;  . MOUTH SURGERY    . SINUS SURGERY WITH INSTATRAK      Family History  Problem Relation Age of Onset  . Colon polyps Sister   . Mental illness Sister        anxiety from 9/11 survivors  . Varicose Veins Sister   . Diabetes Mother   . Hypertension Mother   . Heart attack Mother        MI at age 54  . Alcohol abuse Mother        smoker  . Heart disease Mother   . Dementia Mother   . Hypertension Father   . Hyperlipidemia Father   . Heart attack Father   . Cancer Father        lung cancer, smoker  . Cancer Brother        acute leukemia  . Dementia Maternal Grandmother   . Heart disease Maternal Grandfather        mi  . Cancer Paternal Grandmother        GYN cancer  . Other Sister        hypoglycemia  . Varicose Veins Sister   . Rheum arthritis Sister   . Pleurisy Sister   . Fibromyalgia Sister   . Mental retardation Sister        depression  . GER disease Son   . Alcohol abuse Unknown        Family history  . Colon cancer Neg Hx   . Breast cancer Neg Hx     Allergies  Allergen Reactions  . Amoxicillin Shortness Of Breath    Doesn't remember rxn  . Aspirin Shortness Of Breath    "irritated stomach" Ibuprofen and Naproxen do not agree with her either  . Codeine Hives and Shortness Of Breath    Does not take percocet or hydrocodone; tylenol only  . Latex Shortness Of Breath and Itching  . Other Shortness Of Breath    peanuts  . Sulfa Antibiotics Hives and Shortness Of Breath  . Sulfamethoxazole-Trimethoprim Shortness Of Breath  . Sulfonamide Derivatives Shortness Of Breath and Itching  . Sulphadimidine Sodium [Sulfamethazine Sodium] Shortness Of Breath    Added to food for preservative  . Ciprofloxacin Other (See Comments)    REACTION: SOB, "Tightness in head" REACTION: SOB, "Tightness in head" Syncope   . Azithromycin Other (See Comments)    Faintness  . Carisoprodol   .  Clarithromycin     "Doesn't agree with me"  . Epinephrine     Heart racing  . Metronidazole Other (See Comments)    "cannot tolerate"  . Nsaids   .  Penicillins     Doesn't remember reaction  . Tolmetin   . Omeprazole Palpitations    Current Outpatient Medications on File Prior to Visit  Medication Sig Dispense Refill  . Multiple Vitamins-Minerals (MULTIVITAMIN ADULT PO) Take 1 tablet by mouth daily.    Marland Kitchen OVER THE COUNTER MEDICATION Calcium    . Probiotic Product (PROBIOTIC DAILY PO) Take 1 tablet by mouth daily.     No current facility-administered medications on file prior to visit.     BP 110/68   Pulse 71   Temp 98 F (36.7 C) (Oral)   Resp 16   Ht 4\' 11"  (1.499 m)   Wt 104 lb 12.8 oz (47.5 kg)   SpO2 100%   BMI 21.17 kg/m       Objective:   Physical Exam  General  Mental Status - Alert. General Appearance - Well groomed. Not in acute distress.  Skin Rashes- No Rashes.  HEENT Head- Normal. Ear Auditory Canal - Left- Normal. Right - Normal.Tympanic Membrane- Left- Normal. Right- Normal. Eye Sclera/Conjunctiva- Left- Normal. Right- Normal. Nose & Sinuses Nasal Mucosa- Left-  Boggy and Congested. Right-  Boggy and  Congested.Bilateral no maxillary and no  frontal sinus pressure. Mouth & Throat Lips: Upper Lip- Normal: no dryness, cracking, pallor, cyanosis, or vesicular eruption. Lower Lip-Normal: no dryness, cracking, pallor, cyanosis or vesicular eruption. Buccal Mucosa- Bilateral- No Aphthous ulcers. Oropharynx- No Discharge or Erythema. +pnd. Tonsils: Characteristics- Bilateral- No Erythema or Congestion. Size/Enlargement- Bilateral- No enlargement. Discharge- bilateral-None.  Neck Neck- Supple. No Masses.   Chest and Lung Exam Auscultation: Breath Sounds:-Clear even and unlabored.  Cardiovascular Auscultation:Rythm- Regular, rate and rhythm. Murmurs & Other Heart Sounds:Ausculatation of the heart reveal- No Murmurs.  Lymphatic Head &  Neck General Head & Neck Lymphatics: Bilateral: Description- No Localized lymphadenopathy.  Abdomen- soft, nontender presently.       Assessment & Plan:  You have had very difficult time finding treatment regimen for H. pylori due to your allergic reaction/sensitivity history.  On review of up-to-date website I found some information regarding Levaquin and amoxicillin regimen.  I think you might be able to tolerate this regimen.  You report having had syncopal type episode shortly after taking Cipro in the past.  But on discussion that seems to be potentially related to dehydration.  Would recommend that you ask the GI MD that you are seeing in October regarding the above potential treatment regimen.  Also recommend that you ask him whether EGD and colonoscopy indicated.  You had question regarding of phototherapy treatment for H. pylori.  You might want to contact integrative therapies in [place] and see if they do that treatment.  If so recommend contacting your insurance to see if it is covered.  You have recent allergic rhinitis symptoms and I will prescribe Flonase nasal spray today.  If your symptoms persist then you might consider adding antihistamine.  Follow up as regularly scheduled with pcp or as needed  40 minutes spent with pt today. 50% of times spent with patient discussing potential treatments for h pylori and work up upcoming GI might do.

## 2018-01-14 ENCOUNTER — Other Ambulatory Visit: Payer: 59

## 2018-01-14 NOTE — Telephone Encounter (Signed)
Patient is scheduled for 02/04/18 with [facility]

## 2018-01-17 ENCOUNTER — Other Ambulatory Visit (INDEPENDENT_AMBULATORY_CARE_PROVIDER_SITE_OTHER): Payer: 59

## 2018-01-17 DIAGNOSIS — Z0000 Encounter for general adult medical examination without abnormal findings: Secondary | ICD-10-CM

## 2018-01-17 LAB — HEMOGLOBIN A1C: HEMOGLOBIN A1C: 5.4 % (ref 4.6–6.5)

## 2018-01-18 DIAGNOSIS — H04123 Dry eye syndrome of bilateral lacrimal glands: Secondary | ICD-10-CM | POA: Diagnosis not present

## 2018-01-20 ENCOUNTER — Telehealth: Payer: Self-pay

## 2018-01-20 NOTE — Telephone Encounter (Signed)
Copied from [place] [id number]. Topic: Inquiry >> Jan 20, 2018  8:40 AM Reyne Dumas L wrote: Reason for CRM:   Pt calling to find out result of A1C.  Pt can be reached at [phone]

## 2018-01-21 NOTE — Telephone Encounter (Signed)
Left message for patient to call the office

## 2018-01-28 IMAGING — DX DG CHEST 2V
2 series · 2 of 2 positions shown · non-contrast
Comparison: 11/06/2009

CLINICAL DATA: Dry cough and congestion. Chest heaviness and flu
symptoms for the past 2 weeks.

EXAM:
CHEST  2 VIEW

[chest pa]
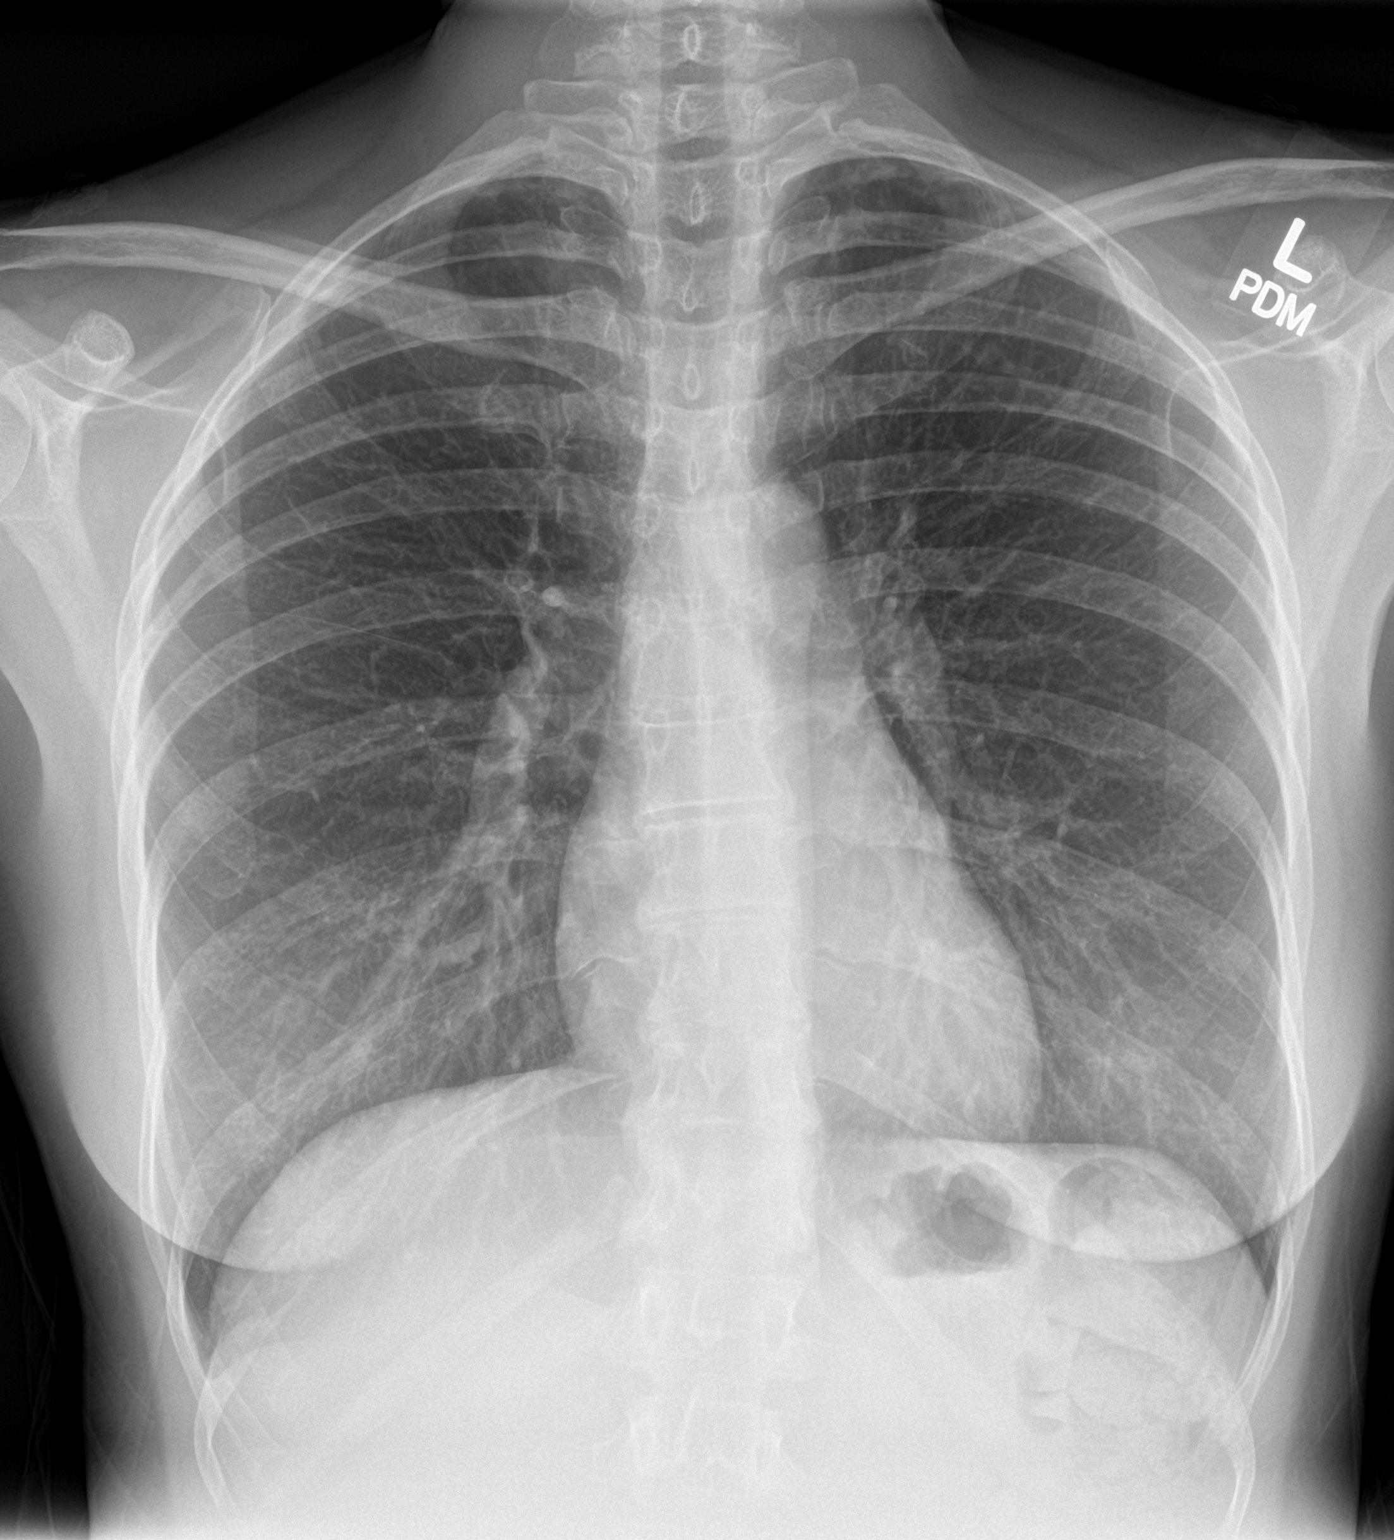

[chest lat]
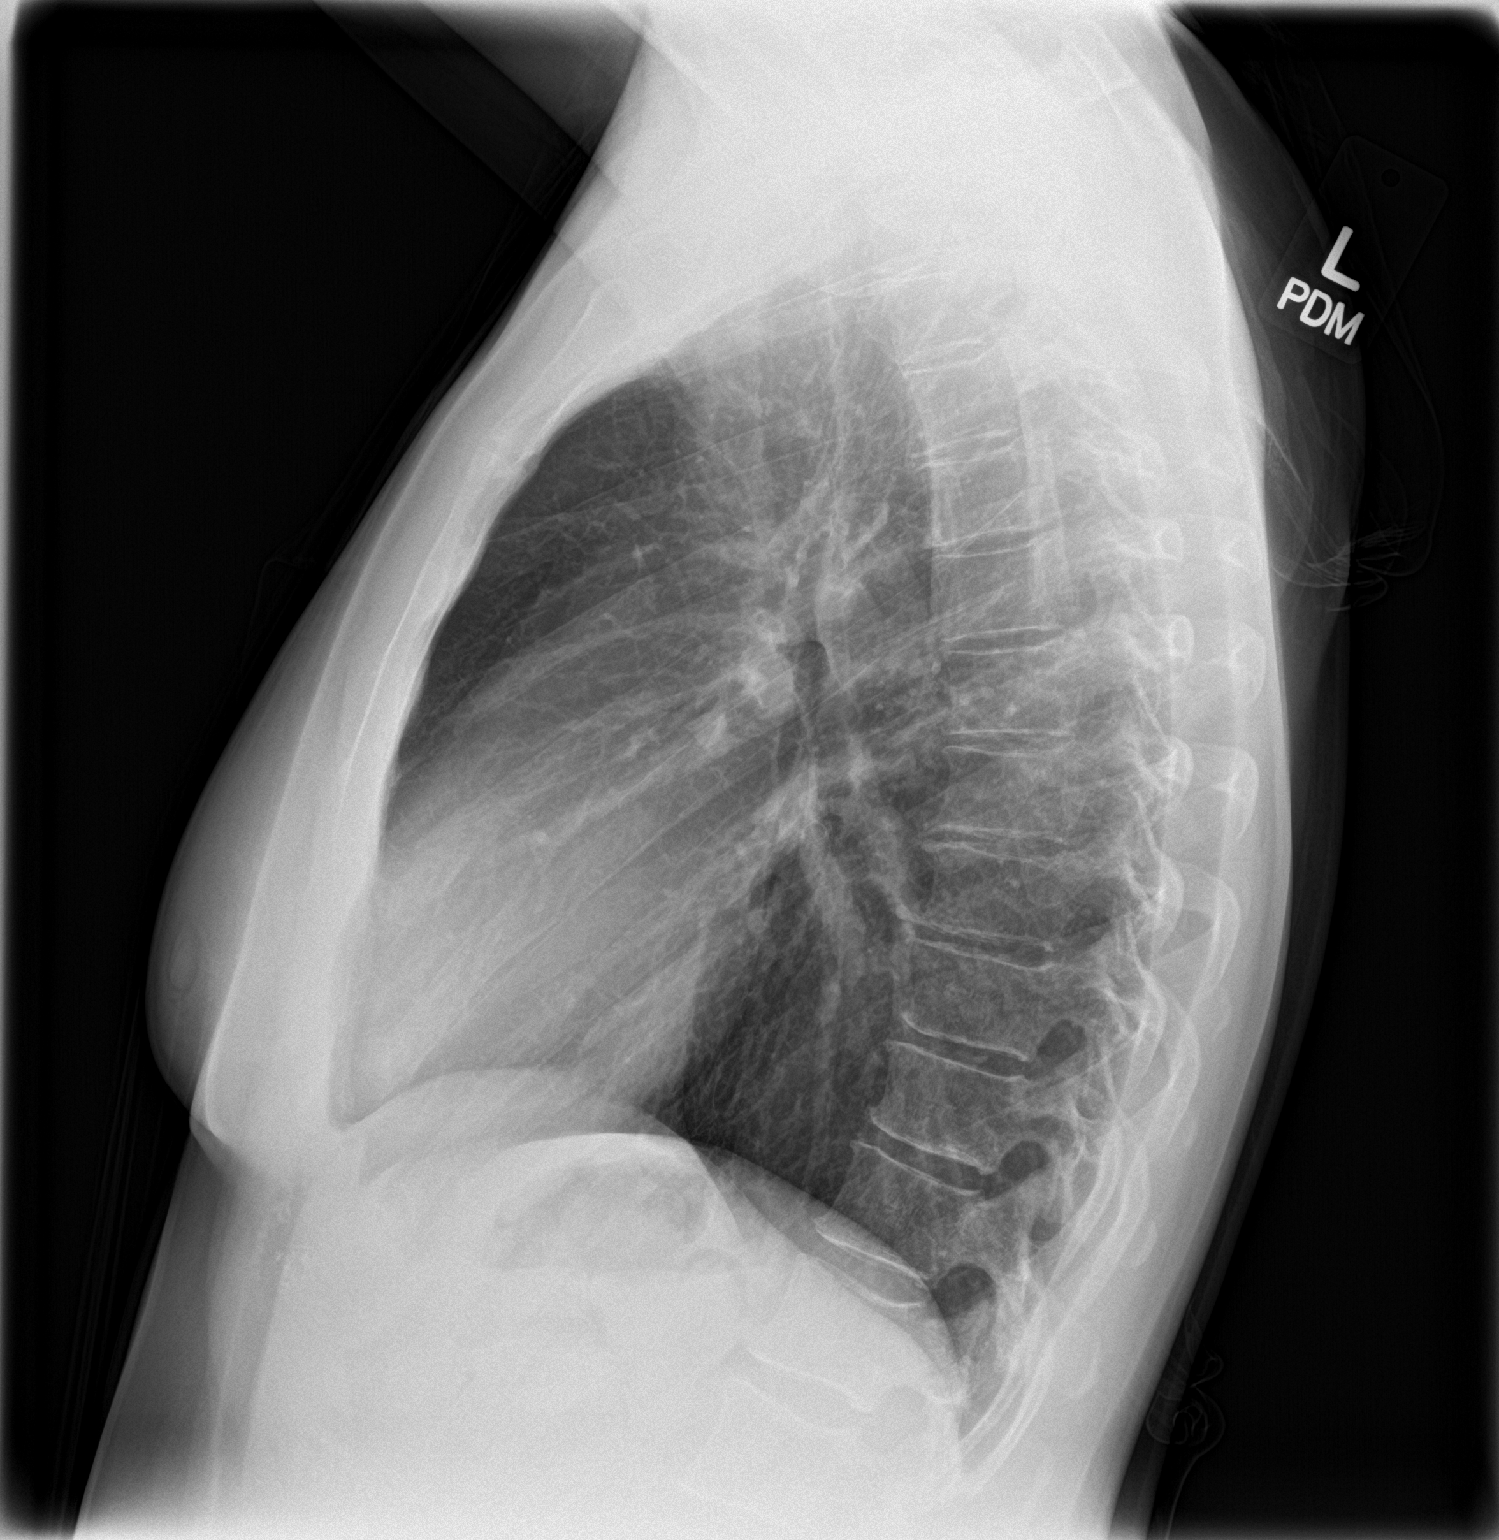

[2 of 2 positions shown; findings below may reference images not displayed]

FINDINGS: Unchanged cardiac silhouette and mediastinal contours. No focal
parenchymal opacities. No pleural effusion or pneumothorax. No
evidence of edema. No acute osseus abnormalities.
IMPRESSION: No acute cardiopulmonary disease. Specifically, no evidence of
pneumonia.

## 2018-01-31 ENCOUNTER — Telehealth: Payer: Self-pay

## 2018-01-31 ENCOUNTER — Telehealth: Payer: Self-pay | Admitting: Medical

## 2018-01-31 NOTE — Telephone Encounter (Signed)
Copied from [place] [id number]. Topic: Inquiry >> Jan 31, 2018  2:36 PM Berneta Levins wrote: Reason for CRM:   Pt was told to remind physician that he needed to send CT results to Dr. Herbie [place] Buccini - [facility].

## 2018-01-31 NOTE — Telephone Encounter (Signed)
Pt is seeing Dr. Cristina Gong GI. Will you fax over copy of pt ct abdomen and pelvis. So they can have that to review.

## 2018-02-02 ENCOUNTER — Ambulatory Visit: Payer: Self-pay | Admitting: *Deleted

## 2018-02-02 NOTE — Telephone Encounter (Signed)
Pt states that she noticed 1 bruises on her abdomen; she is concerned because she wonders if her H pylori may be affecting this; she is also concerned that she might have a hernia;the pt said that she has also been working out strenuously in the gym; recommendations made per nurse triage protocol; she verbalizes understanding;the pt is normally seen by Dr Willa Frater; will route to office for notification  Reason for Disposition . Minor bruise  Answer Assessment - Initial Assessment Questions 1. APPEARANCE of BRUISE: "Describe the bruise."      Black/blue/greenish 2. SIZE: "How large is the bruise?"      Quarter sized 3. NUMBER: "How many bruises are there?"      1 4. LOCATION: "Where is the bruise located?"      Middle of stomach 5. ONSET: "How long ago did the bruise occur?"      Noticed 01/30/18 6. CAUSE: "Tell me how it happened."     Worked out in gym; strenuous exercise 7. MEDICAL HISTORY: "Do you have any medical problems that can cause easy bruising or bleeding?" (e.g., leukemia, liver disease, recent chemotherapy)     no 8. MEDICATIONS : "Do you take any medications which thin the blood such as: aspirin, heparin, ibuprofen (NSAIDS), Plavix, or Coumadin?"     no 9. OTHER SYMPTOMS: "Do you have any other symptoms?"  (e.g., weakness, dizziness, pain, fever, nosebleed, blood in urine/stool)     Dizziness, believes due to sinus pressure; tiredness starting 02/02/18 10. PREGNANCY: "Is there any chance you are pregnant?" "When was your last menstrual period?"       No no  Protocols used: BRUISES-A-AH

## 2018-02-02 NOTE — Telephone Encounter (Signed)
H pylori would not affect bruising. Hernias do not have to be treated unless they become symptomatic. So if it gets uncomfortable she should come in for evaluation

## 2018-02-03 NOTE — Telephone Encounter (Signed)
Done sent through epic

## 2018-02-04 DIAGNOSIS — K581 Irritable bowel syndrome with constipation: Secondary | ICD-10-CM | POA: Diagnosis not present

## 2018-02-04 DIAGNOSIS — R110 Nausea: Secondary | ICD-10-CM | POA: Diagnosis not present

## 2018-02-04 DIAGNOSIS — A048 Other specified bacterial intestinal infections: Secondary | ICD-10-CM | POA: Diagnosis not present

## 2018-02-25 ENCOUNTER — Other Ambulatory Visit: Payer: Self-pay | Admitting: Obstetrics and Gynecology

## 2018-02-25 DIAGNOSIS — Z1231 Encounter for screening mammogram for malignant neoplasm of breast: Secondary | ICD-10-CM

## 2018-03-18 ENCOUNTER — Ambulatory Visit
Admission: RE | Admit: 2018-03-18 | Discharge: 2018-03-18 | Disposition: A | Discharge status: Home / Self Care | Payer: 59 | Source: Ambulatory Visit | Attending: Obstetrics and Gynecology | Admitting: Obstetrics and Gynecology

## 2018-03-18 DIAGNOSIS — Z1231 Encounter for screening mammogram for malignant neoplasm of breast: Secondary | ICD-10-CM | POA: Diagnosis not present

## 2018-03-29 DIAGNOSIS — R413 Other amnesia: Secondary | ICD-10-CM | POA: Diagnosis not present

## 2018-04-06 DIAGNOSIS — K581 Irritable bowel syndrome with constipation: Secondary | ICD-10-CM | POA: Diagnosis not present

## 2018-04-06 DIAGNOSIS — R140 Abdominal distension (gaseous): Secondary | ICD-10-CM | POA: Diagnosis not present

## 2018-04-06 DIAGNOSIS — R110 Nausea: Secondary | ICD-10-CM | POA: Diagnosis not present

## 2018-04-07 ENCOUNTER — Encounter: Payer: Self-pay | Admitting: Medical

## 2018-04-07 ENCOUNTER — Ambulatory Visit: Payer: 59 | Admitting: Medical

## 2018-04-07 ENCOUNTER — Telehealth: Payer: Self-pay | Admitting: Medical

## 2018-04-07 VITALS — BP 118/70 | HR 76 | Temp 98.1°F | Resp 16 | Ht 59.0 in | Wt 107.0 lb

## 2018-04-07 DIAGNOSIS — R5383 Other fatigue: Secondary | ICD-10-CM | POA: Diagnosis not present

## 2018-04-07 DIAGNOSIS — R0781 Pleurodynia: Secondary | ICD-10-CM | POA: Diagnosis not present

## 2018-04-07 DIAGNOSIS — Z20818 Contact with and (suspected) exposure to other bacterial communicable diseases: Secondary | ICD-10-CM

## 2018-04-07 DIAGNOSIS — J0100 Acute maxillary sinusitis, unspecified: Secondary | ICD-10-CM | POA: Diagnosis not present

## 2018-04-07 MED ORDER — BENZONATATE 100 MG PO CAPS: 100.0000 mg | ORAL_CAPSULE | Freq: Three times a day (TID) | ORAL | 0 refills | Status: DC | PRN

## 2018-04-07 MED ORDER — DOXYCYCLINE HYCLATE 100 MG PO TABS: 100.0000 mg | ORAL_TABLET | Freq: Two times a day (BID) | ORAL | 0 refills | Status: DC

## 2018-04-07 MED ORDER — FLUTICASONE PROPIONATE 50 MCG/ACT NA SUSP: 2.0000 | Freq: Every day | NASAL | 1 refills | Status: DC

## 2018-04-07 NOTE — Progress Notes (Signed)
Subjective:    Patient ID: [name], female    DOB: 1966-02-18, 52 y.o.   MRN: [id number]  HPI  Pt in with some nasal congestion and sinus pressure for about one week. Gradually getting worse. Also feeling mild transient dizzy. Some faint pain tension in left parietal area.  But she does not report a headache or any gross motor/sensory function deficits.  Early on she tried flonase.  She has colored discharge when blows her nose.   Occasional dry cough.  Pt has not sore throat.  Mild thirsty.  Pt does have on and off left lower rib pain over past month.  Pain little bit more constant over the past week.  Often times will note correlation with working out.  She also expresses concern of some fatigue.  Notes low B12 in the past and wants to make sure her iron is normal.    Review of Systems  Constitutional: Positive for fatigue. Negative for chills.  HENT: Positive for congestion, sinus pressure and sinus pain. Negative for ear pain, sneezing and sore throat.        No sore throat present but she does report exposure to friend who had strep with no symptoms.  Respiratory: Negative for cough, chest tightness, wheezing and stridor.   Cardiovascular: Negative for chest pain and palpitations.  Gastrointestinal: Negative for abdominal pain.  Musculoskeletal: Negative for back pain, myalgias and neck pain.  Skin: Negative for rash.  Neurological: Positive for dizziness. Negative for headaches.       Mild dizzy intermittent  Hematological: Negative for adenopathy. Does not bruise/bleed easily.  Psychiatric/Behavioral: Negative for agitation and behavioral problems.    Past Medical History:  Diagnosis Date  . Allergic rhinitis   . Anemia   . Anxiety   . Asthma   . Contact dermatitis and eczema   . Diverticulitis    CT Scan  . Esophageal reflux   . H. pylori infection   . Hyperlipidemia, mixed   . IBS (irritable bowel syndrome)   . IC (interstitial cystitis)   .  Internal hemorrhoids   . Lumbago   . Mitral valve disorders(424.0)   . Osteoarthritis 07/08/2015  . Post concussive syndrome      Social History   Socioeconomic History  . Marital status: Married    Spouse name: Not on file  . Number of children: 2  . Years of education: Masters  . Highest education level: Not on file  Occupational History  . Occupation: [employer]  Social Needs  . Financial resource strain: Not on file  . Food insecurity:    Worry: Not on file    Inability: Not on file  . Transportation needs:    Medical: Not on file    Non-medical: Not on file  Tobacco Use  . Smoking status: Never Smoker  . Smokeless tobacco: Never Used  Substance and Sexual Activity  . Alcohol use: Yes    Comment: Holidays only  . Drug use: No  . 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 Physical activity:    Days per week: Not on file    Minutes per session: Not on file  . Stress: Not on file  Relationships  . Social connections:    Talks on phone: Not on file    Gets together: Not on file    Attends religious service: Not on file  Active member of club or organization: Not on file    Attends meetings of clubs or organizations: Not on file    Relationship status: Not on file  . Intimate partner violence:    Fear of current or ex partner: Not on file    Emotionally abused: Not on file    Physically abused: Not on file    Forced sexual activity: Not on file  Other Topics Concern  . Not on file  Social History Narrative   Lives at home home with husband and son.   Right-handed.   1 cup caffeine daily.    Past Surgical History:  Procedure Laterality Date  . BARTHOLIN CYST MARSUPIALIZATION N/A 12/18/2013   Procedure: BARTHOLIN CYST MARSUPIALIZATION WITH BIOPSY;  Surgeon: Lovenia Kim, MD;  Location: [facility];  Service: Gynecology;  Laterality: N/A;  . CESAREAN  SECTION    . DILITATION & CURRETTAGE/HYSTROSCOPY WITH NOVASURE ABLATION N/A 10/14/2012   Procedure: DILATATION & CURETTAGE/HYSTEROSCOPY WITH NOVASURE ABLATION;  Surgeon: Lovenia Kim, MD;  Location: [facility];  Service: Gynecology;  Laterality: N/A;  . MOUTH SURGERY    . SINUS SURGERY WITH INSTATRAK      Family History  Problem Relation Age of Onset  . Colon polyps Sister   . Mental illness Sister        anxiety from 9/11 survivors  . Varicose Veins Sister   . Diabetes Mother   . Hypertension Mother   . Heart attack Mother        MI at age 43  . Alcohol abuse Mother        smoker  . Heart disease Mother   . Dementia Mother   . Hypertension Father   . Hyperlipidemia Father   . Heart attack Father   . Cancer Father        lung cancer, smoker  . Cancer Brother        acute leukemia  . Dementia Maternal Grandmother   . Heart disease Maternal Grandfather        mi  . Cancer Paternal Grandmother        GYN cancer  . Other Sister        hypoglycemia  . Varicose Veins Sister   . Rheum arthritis Sister   . Pleurisy Sister   . Fibromyalgia Sister   . Mental retardation Sister        depression  . GER disease Son   . Alcohol abuse Unknown        Family history  . Colon cancer Neg Hx   . Breast cancer Neg Hx     Allergies  Allergen Reactions  . Amoxicillin Shortness Of Breath    Doesn't remember rxn  . Aspirin Shortness Of Breath    "irritated stomach" Ibuprofen and Naproxen do not agree with her either  . Codeine Hives and Shortness Of Breath    Does not take percocet or hydrocodone; tylenol only  . Latex Shortness Of Breath and Itching  . Other Shortness Of Breath    peanuts  . Sulfa Antibiotics Hives and Shortness Of Breath  . Sulfamethoxazole-Trimethoprim Shortness Of Breath  . Sulfonamide Derivatives Shortness Of Breath and Itching  . Sulphadimidine Sodium [Sulfamethazine Sodium] Shortness Of Breath    Added to food for preservative  . Ciprofloxacin Other  (See Comments)    REACTION: SOB, "Tightness in head" REACTION: SOB, "Tightness in head" Syncope   . Azithromycin Other (See Comments)    Faintness  . Carisoprodol   .  Clarithromycin     "Doesn't agree with me"  . Epinephrine     Heart racing  . Metronidazole Other (See Comments)    "cannot tolerate"  . Nsaids   . Penicillins     Doesn't remember reaction  . Tolmetin   . Omeprazole Palpitations    Current Outpatient Medications on File Prior to Visit  Medication Sig Dispense Refill  . fluticasone (FLONASE) 50 MCG/ACT nasal spray Place 2 sprays into both nostrils daily. 16 g 1  . Multiple Vitamins-Minerals (MULTIVITAMIN ADULT PO) Take 1 tablet by mouth daily.    Marland Kitchen OVER THE COUNTER MEDICATION Calcium    . Probiotic Product (PROBIOTIC DAILY PO) Take 1 tablet by mouth daily.     No current facility-administered medications on file prior to visit.     BP (!) 102/45   Pulse 76   Temp 98.1 F (36.7 C) (Oral)   Resp 16   Ht 4\' 11"  (1.499 m)   Wt 107 lb (48.5 kg)   SpO2 100%   BMI 21.61 kg/m       Objective:   Physical Exam  General  Mental Status - Alert. General Appearance - Well groomed. Not in acute distress.  Skin Rashes- No Rashes.  HEENT Head- Normal. Ear Auditory Canal - Left- Normal. Right - Normal.Tympanic Membrane- Left- Normal. Right- Normal. Eye Sclera/Conjunctiva- Left- Normal. Right- Normal. Nose & Sinuses Nasal Mucosa- Left-  Boggy and Congested. Right-  Boggy and  Congested.Bilateral faint  maxillary and  Faint frontal sinus pressure. Mouth & Throat Lips: Upper Lip- Normal: no dryness, cracking, pallor, cyanosis, or vesicular eruption. Lower Lip-Normal: no dryness, cracking, pallor, cyanosis or vesicular eruption. Buccal Mucosa- Bilateral- No Aphthous ulcers. Oropharynx- No Discharge or Erythema. Tonsils: Characteristics- Bilateral- Erythema or Congestion. Size/Enlargement- Bilateral- No enlargement. Discharge- bilateral-None.  Neck Neck-  Supple. No Masses.   Chest and Lung Exam Auscultation: Breath Sounds:-Clear even and unlabored.  Cardiovascular Auscultation:Rythm- Regular, rate and rhythm. Murmurs & Other Heart Sounds:Ausculatation of the heart reveal- No Murmurs.  Lymphatic Head & Neck General Head & Neck Lymphatics: Bilateral: Description- No Localized lymphadenopathy.       Assessment & Plan:  Your appear to have a sinus infection. I am prescribing  doxycycline antibiotic for the infection. To help with the nasal congestion I prescribed  flonase nasal steroid.   For your associated cough, I prescribed cough medicine benzonatate  For left lower rib pain on and off for a month will get cxr with rib view. Future order placed.  Strep test done by my MA did not give me result. Will send her message.  Rest, hydrate, tylenol for fever.  B1, iron, and b12 future level.  Follow up in 7 days or as needed.  40 minutes spent with patient today.  50% of time counseling patient regarding her diagnosis today as well as her concerns regarding potential cause for her fatigue and counseled her regarding her faint parietal region of pain.  Counseled the patient that if this pain were to persist I might consider imaging studies.  Explained I did not feel like this was necessary today.  If she were to have any worse pain in this area or neurologic signs/symptoms and recommend ED evaluation.  Also got into discussion about benefit of B12 and that we can give her her monthly injections but want to get labs before ordering any B12 injections.  Mackie Pai, PA-C

## 2018-04-07 NOTE — Patient Instructions (Addendum)
Your appear to have a sinus infection. I am prescribing  doxycycline antibiotic for the infection. To help with the nasal congestion I prescribed  flonase nasal steroid.   For your associated cough, I prescribed cough medicine benzonatate  For left lower rib pain on and off for a month will get cxr with rib view. Future order placed.  Strep test done by my MA did not give me result. Will send her message.  B1, iron, and b12 future level.  Rest, hydrate, tylenol for fever.  Follow up in 7 days or as needed.

## 2018-04-07 NOTE — Telephone Encounter (Signed)
Open to review.  

## 2018-04-07 NOTE — Telephone Encounter (Deleted)
Would you please result patients strep test.  Let me know what that result was.  She states that you did the test before I got in the room.  But did not see it in result note read area.

## 2018-04-11 DIAGNOSIS — R300 Dysuria: Secondary | ICD-10-CM | POA: Diagnosis not present

## 2018-04-11 DIAGNOSIS — R5383 Other fatigue: Secondary | ICD-10-CM | POA: Diagnosis not present

## 2018-04-11 DIAGNOSIS — N644 Mastodynia: Secondary | ICD-10-CM | POA: Diagnosis not present

## 2018-04-18 DIAGNOSIS — A048 Other specified bacterial intestinal infections: Secondary | ICD-10-CM | POA: Diagnosis not present

## 2018-05-02 DIAGNOSIS — Z6821 Body mass index (BMI) 21.0-21.9, adult: Secondary | ICD-10-CM | POA: Diagnosis not present

## 2018-05-02 DIAGNOSIS — Z01419 Encounter for gynecological examination (general) (routine) without abnormal findings: Secondary | ICD-10-CM | POA: Diagnosis not present

## 2018-05-05 ENCOUNTER — Ambulatory Visit: Payer: Self-pay | Admitting: *Deleted

## 2018-05-05 ENCOUNTER — Telehealth: Payer: Self-pay

## 2018-05-05 ENCOUNTER — Ambulatory Visit (HOSPITAL_BASED_OUTPATIENT_CLINIC_OR_DEPARTMENT_OTHER)
Admission: RE | Admit: 2018-05-05 | Discharge: 2018-05-05 | Disposition: A | Discharge status: Home / Self Care | Payer: 59 | Source: Ambulatory Visit | Attending: Medical | Admitting: Medical

## 2018-05-05 DIAGNOSIS — R0781 Pleurodynia: Secondary | ICD-10-CM | POA: Insufficient documentation

## 2018-05-05 NOTE — Telephone Encounter (Signed)
Copied from [place] [id number]. Topic: General - Other >> May 05, 2018  8:30 AM [name] wrote: Reason for CRM: pt calling stating that she went to her OBGYN and had her B12 checked her level is 2000 she stop taking her B12 supplements today she also states that she was told that she can get a xray of her rib

## 2018-05-05 NOTE — Telephone Encounter (Signed)
Princess,  Would you please get pt to make appointment with Dr. Charlett Blake. I have been seeing her couple of times recently but do want her to get back on with DrBllyth.  Advise pt  to stop b12 otc supplementation and repeat b12 in 2-3 weeks. Looks like b12 future order already ordered. It should come back down to normal range. If it does not then would notify Dr. Charlett Blake and consider referral to specialist if Dr. Charlett Blake agrees. I rarely  see high b12 level unless patients are supplamenting.  See b12 order. Want to make sure that result comes to me. Can you see who put that order in?  Also advsie pt to review my chart result note for chest xray.

## 2018-05-05 NOTE — Telephone Encounter (Signed)
Contacted pt regarding her request; she states that her B12 level at her OB/GYN about 1-2 weeks ago was > 2000; she says that she is taking B12 supplements and her OB/GYN wants her to follow up with her PCP; the pt is also requesting advice on what she can do to lower this level; explained that this information will be sent to [employer] for recommendations; the pt also inquired about a chest xray that was ordered when she was seen in the office on 04/07/2018; spoke with Hoyle Sauer in [place] at [facility], and she verifies that the order has been received; she states that the pt can have this done from 0700-1730 in radiology Mon-Sat, and after and Sun  check in ED as an outpatient; relayed to pt and she verbalized understanding; will also route to office for provider recommendations; the pt is scheduled to see Mackie Pai 05/06/2018 at 1530; she can be contacted at [phone] and a message can be left at this number   Reason for Disposition . Nursing judgment or information in reference  Answer Assessment - Initial Assessment Questions 1. REASON FOR CALL: "What is your main concern right now?"     Vitamin B12 level >2000 per OB/GYN office 2. ONSET: "When did the start?"     Lab drawn about 2 weeks ago  3. SEVERITY: "How bad is the ?"     no 4. FEVER: "Do you have a fever?"     n/a 5. OTHER SYMPTOMS: "Do you have any other new symptoms?"     n/a 6. INTERVENTIONS AND RESPONSE: "What have you done so far to try to make this better? What medications have you used?"     n/a 7. PREGNANCY: "Is there any chance you are pregnant?"  Protocols used: NO GUIDELINE AVAILABLE-A-AH

## 2018-05-05 NOTE — Telephone Encounter (Signed)
Patient called in and note was routed

## 2018-05-06 ENCOUNTER — Ambulatory Visit: Payer: 59 | Admitting: Medical

## 2018-05-06 NOTE — Telephone Encounter (Signed)
Spoke with patient she has scheduled w/ PCP for March

## 2018-05-09 ENCOUNTER — Telehealth: Payer: Self-pay | Admitting: *Deleted

## 2018-05-09 NOTE — Telephone Encounter (Signed)
Received Lab Report results from [facility]; forwarded to provider/SLS 01/13

## 2018-05-27 ENCOUNTER — Other Ambulatory Visit (INDEPENDENT_AMBULATORY_CARE_PROVIDER_SITE_OTHER): Payer: 59

## 2018-05-27 ENCOUNTER — Telehealth: Payer: Self-pay | Admitting: *Deleted

## 2018-05-27 DIAGNOSIS — R5383 Other fatigue: Secondary | ICD-10-CM

## 2018-05-27 NOTE — Telephone Encounter (Signed)
Pt said she will try to be here by 3:15 but cannot be here earlier. Pt is disappointed that this happened as she took time off of work. She is checking with her boss. If pt needs to come sooner please call back to advise.

## 2018-05-27 NOTE — Telephone Encounter (Signed)
Attempted to notify pt that she does not have to r/s lab appt and if she cannot be here before 3:45 that is ok. We will still see her at 3:45. Detailed message was left on her voicemail.

## 2018-05-27 NOTE — Addendum Note (Signed)
Addended by: Caffie Pinto on: 05/27/2018 12:02 PM   Modules accepted: Orders

## 2018-05-27 NOTE — Telephone Encounter (Signed)
Left message for pt to call. PT has lab appt at 3:45pm today and we need to see if pt can come in earlier in the afternoon. Ok for [facility] /Triage to discuss / schedule with pt if she is able to come earlier.

## 2018-05-28 ENCOUNTER — Telehealth: Payer: Self-pay | Admitting: Medical

## 2018-05-28 DIAGNOSIS — R748 Abnormal levels of other serum enzymes: Secondary | ICD-10-CM

## 2018-05-28 NOTE — Telephone Encounter (Signed)
Referral to hematologist. Placed.

## 2018-05-28 NOTE — Telephone Encounter (Signed)
Let pt know her b12 level is still high. Since still high even though she has been off b12 so  will refer to hematologist to evaluate potential causes of her b12 elevation.

## 2018-05-30 DIAGNOSIS — Z1211 Encounter for screening for malignant neoplasm of colon: Secondary | ICD-10-CM | POA: Diagnosis not present

## 2018-05-30 DIAGNOSIS — Z1212 Encounter for screening for malignant neoplasm of rectum: Secondary | ICD-10-CM | POA: Diagnosis not present

## 2018-05-30 LAB — VITAMIN B12: Vitamin B-12: >2000 pg/mL — ABNORMAL HIGH (ref 200–1100)

## 2018-05-30 LAB — VITAMIN B1: Vitamin B1 (Thiamine): 15 nmol/L (ref 8–30)

## 2018-05-30 LAB — IRON: Iron: 109 ug/dL (ref 45–160)

## 2018-05-31 ENCOUNTER — Telehealth: Payer: Self-pay | Admitting: Family

## 2018-05-31 ENCOUNTER — Telehealth: Payer: Self-pay

## 2018-05-31 NOTE — Telephone Encounter (Signed)
Patient came in to get her lab results

## 2018-05-31 NOTE — Telephone Encounter (Signed)
Copied from [place] [id number]. Topic: General - Other >> May 31, 2018 11:57 AM Percell Belt A wrote: Reason for CRM:   Pt called in and wanted to know why she was being referred to [facility] - AMB REFERRAL TO HEMATOLOGY?  She is concerned as to why the provider is sending her to them.  She does know it is concerning her b12 levels but wants to know what they are testing for ?    Please advise

## 2018-05-31 NOTE — Telephone Encounter (Signed)
Pt notified. Pt is ok with referral.

## 2018-05-31 NOTE — Telephone Encounter (Signed)
Yes I do not have any idea why this is so high and they can help [place] try and figure that out.

## 2018-05-31 NOTE — Telephone Encounter (Signed)
lmom to inform pt of new patietn appointment. Mailed appt letter for 06/15/18 at 130 pm

## 2018-06-03 ENCOUNTER — Telehealth: Payer: Self-pay | Admitting: *Deleted

## 2018-06-03 NOTE — Telephone Encounter (Signed)
Patient called to ask Dr Harvie Heck does he need to see her before she go in the see the Hematologist. She also states that it is hard to get an appointment due to her late work schedule. Patient would like a call back with what to do please. Ph#  [phone]

## 2018-06-03 NOTE — Telephone Encounter (Signed)
I am seeing this on Friday at 5:39. About to leave office and was busy with pt care in office all day.   Would you print copy of pt metabolic panels and mail for her to review. Include sept 2019.  Also let her know I did see abnormality. Dr. Charlett Blake reviewed.  This is Dr. Charlett Blake pt. She has appointment in march. Please call pt and ask her to keep that appointment.  Also will you ask Gwenn to call and see if she will get update on referral to Dr. Marin Olp for very high b12 level.  She wanted me to call. But just too difficult logisticaly seeing pt all day.  Stress she needs to see pcp anyway.

## 2018-06-03 NOTE — Telephone Encounter (Signed)
Copied from [place] [id number]. Topic: General - Other >> Jun 03, 2018  9:13 AM Rayann Heman wrote: Reason for CRM: pt would like a call back regarding labs from 12/28/2017. She would like to discuss liver function. Pt would like a copy sent to home. Please advise

## 2018-06-04 ENCOUNTER — Other Ambulatory Visit (HOSPITAL_COMMUNITY)
Admit: 2018-06-04 | Discharge: 2018-06-04 | Disposition: A | Discharge status: Home / Self Care | Payer: 59 | Source: Other Acute Inpatient Hospital | Attending: Family Medicine | Admitting: Family Medicine

## 2018-06-04 ENCOUNTER — Ambulatory Visit: Payer: 59 | Admitting: Family Medicine

## 2018-06-04 ENCOUNTER — Encounter: Payer: Self-pay | Admitting: Family Medicine

## 2018-06-04 VITALS — BP 104/72 | HR 96 | Temp 98.8°F | Resp 12 | Ht 59.0 in | Wt 107.0 lb

## 2018-06-04 DIAGNOSIS — R7989 Other specified abnormal findings of blood chemistry: Secondary | ICD-10-CM | POA: Diagnosis not present

## 2018-06-04 DIAGNOSIS — R109 Unspecified abdominal pain: Secondary | ICD-10-CM

## 2018-06-04 DIAGNOSIS — F419 Anxiety disorder, unspecified: Secondary | ICD-10-CM | POA: Diagnosis not present

## 2018-06-04 LAB — POC URINALSYSI DIPSTICK (AUTOMATED)
Bilirubin, UA: NEGATIVE
Glucose, UA: NEGATIVE
Ketones, UA: NEGATIVE
LEUKOCYTES UA: NEGATIVE
Nitrite, UA: NEGATIVE
PROTEIN UA: NEGATIVE
Spec Grav, UA: 1.01 (ref 1.010–1.025)
Urobilinogen, UA: 0.2 E.U./dL
pH, UA: 8.5 — AB (ref 5.0–8.0)

## 2018-06-04 MED ORDER — HYDROXYZINE HCL 10 MG PO TABS: ORAL_TABLET | ORAL | 0 refills | Status: DC

## 2018-06-04 NOTE — Patient Instructions (Signed)
Your urine does not indicate urinary infection, we will send culture to lab to confirm that it is not a UTI.  You will get a message regarding your urine culture results.  I would suggest trying a topical muscle rub such as Biofreeze or BenGay on sore area on your back.  Also can use a heating pad on the sore area.  If feeling anxious, can use 1/2-1 full tablet of hydroxyzine as needed.  This medication can cause drowsiness, so do not take this prior to driving.  Also take deep breaths when feeling self becoming nervous.  Keep hematology appointment as planned for further investigation into your elevated B12 levels.

## 2018-06-04 NOTE — Progress Notes (Signed)
Subjective:    Patient ID: [name], female    DOB: 1966/02/26, 53 y.o.   MRN: [id number]  HPI   Patient presents to clinic complaining of left-sided flank pain.  Patient was concerned she could possibly have a kidney infection.  Denies any pain with urination or radiation of pain wrapping around to abdominal area.  Patient has no fever or chills.  No nausea or vomiting.  Patient also reports having a lot of anxiety.  States she was recently informed that she needs to see a hematologist due to elevated B12 levels in her blood work.  Patient had been taking B12 supplements 4000 mcg/day that she had bought over-the-counter at a grocery store.  Patient was advised to stop taking the supplements and her B12 level was rechecked after the original elevated level was discovered, B12 level remained high so it was then advised that she should see hematology for further investigation.  Patient states she looked up hematology and realized she was going to the cancer center; this made her extremely nervous.  States she could feel the panic rising up in her body, states she then passed out at work due to the panic - this episode occurred 1 week ago .  She had to leave work after the episode due to being very embarrassed.  Patient denies any chest pain.  Denies shortness of breath or wheezing.  Denies feeling faint or dizzy today.  Patient Active Problem List   Diagnosis Date Noted  . Perimenopausal 01/02/2018  . History of Helicobacter pylori infection 12/28/2017  . Nocturia 07/19/2016  . Left lower quadrant pain 07/19/2016  . Rash and nonspecific skin eruption 03/21/2016  . Osteoarthritis 07/08/2015  . Hematuria 01/20/2015  . Hyperlipidemia, mild 01/20/2015  . Head congestion 01/20/2015  . Fatigue 10/24/2014  . Chest pressure 10/24/2014  . Preventative health care 06/03/2014  . Diverticulosis 07/01/2011  . Menopausal state 03/25/2011  . Fibroids 10/24/2010  . Anemia 03/25/2010  .  IRRITABLE BOWEL SYNDROME 03/25/2010  . Mitral valve disorder 07/09/2007  . Allergic asthma, mild intermittent, uncomplicated 10/21/9483  . ESOPHAGEAL REFLUX 07/09/2007  . DERMATITIS 07/09/2007  . LOW BACK PAIN 12/14/2006   Social History   Tobacco Use  . Smoking status: Never Smoker  . Smokeless tobacco: Never Used  Substance Use Topics  . Alcohol use: Yes    Comment: Holidays only   Review of Systems  Constitutional: Negative for chills, fatigue and fever.  HENT: Negative for congestion, ear pain, sinus pain and sore throat.   Eyes: Negative.   Respiratory: Negative for cough, shortness of breath and wheezing.   Cardiovascular: Negative for chest pain, palpitations and leg swelling.  Gastrointestinal: Negative for abdominal pain, diarrhea, nausea and vomiting.  Genitourinary: Negative for dysuria, frequency and urgency. Musculoskeletal: +left flank/mid back pain.  Skin: Negative for color change, pallor and rash.  Neurological: Negative for syncope, light-headedness and headaches.  Psychiatric/Behavioral: The patient is nervous/anxious.       Objective:   Physical Exam Vitals signs and nursing note reviewed.  Constitutional:      General: She is not in acute distress.    Appearance: She is not ill-appearing, toxic-appearing or diaphoretic.  HENT:     Head: Normocephalic and atraumatic.     Nose: Rhinorrhea (clear nasal drainage) present.     Mouth/Throat:     Mouth: Mucous membranes are moist.  Eyes:     General: No scleral icterus.       Right eye: No  discharge.        Left eye: No discharge.     Extraocular Movements: Extraocular movements intact.     Conjunctiva/sclera: Conjunctivae normal.  Cardiovascular:     Rate and Rhythm: Normal rate and regular rhythm.  Pulmonary:     Effort: Pulmonary effort is normal. No respiratory distress.     Breath sounds: Normal breath sounds. No wheezing, rhonchi or rales.  Abdominal:     General: Abdomen is flat. Bowel sounds  are normal. There is no distension.     Palpations: Abdomen is soft. There is no mass.     Tenderness: There is no abdominal tenderness. There is no right CVA tenderness, left CVA tenderness, guarding or rebound.  Skin:    General: Skin is warm and dry.     Coloration: Skin is not pale.  Neurological:     Mental Status: She is alert and oriented to person, place, and time.     Gait: Gait normal.  Psychiatric:        Mood and Affect: Mood is anxious.        Cognition and Memory: Cognition normal.     Comments: Patient very anxious about upcoming hematology appointment due to her elevated B12 levels.  Patient states she looked up different things online, and reading all about what could possibly be found made her extremely nervous.    Vitals:   06/04/18 0946  BP: 104/72  Pulse: 96  Resp: 12  Temp: 98.8 F (37.1 C)  SpO2: 98%      Assessment & Plan:    A total of 25 minutes were spent face-to-face with the patient during this encounter and over half of that time was spent on counseling and coordination of care. The patient was counseled on explanation of need for further work up with hematology, medication for anxiety.   Left flank pain -- urinalysis does show a small amount of blood with a pH on the higher end of the spectrum.  Patient's exam is otherwise unremarkable.  We will send urine off for culture to confirm if UTI is present.  In the meantime patient advised to increase water intake, avoid excess caffeine and sugar.  Advised patient to try a muscle rub on the sore area like BenGay or Biofreeze, and use a heating pad.  Anxiety/ High serum b12-seems like patient's anxiety is stemming from having to see hematology due to high serum B12 level.  Reassured patient that seeing hematology is a good thing to do because it allows specialist to do further work-up to figure out why the B12 is high.  Explained the difference between a hematologist and oncologist, and oftentimes a  hematologist will deal with patients who have different cancers but also do blood disorders.  Reassured patient that going to the cancer center does not automatically mean she has cancer, or work-up is required to figure out because of her high serum B12.  Patient will use low-dose hydroxyzine as needed to see if this helps reduce her anxiety.  Discussed taking deep breaths as a strategy to help reduce anxiety symptoms when they occur.  Patient will keep regularly scheduled follow-up with PCP as planned.  Advised to return to clinic sooner if any issues arise.

## 2018-06-05 LAB — URINE CULTURE: Culture: <10000 — AB

## 2018-06-06 ENCOUNTER — Ambulatory Visit: Payer: 59 | Admitting: Family Medicine

## 2018-06-06 ENCOUNTER — Other Ambulatory Visit: Payer: Self-pay | Admitting: Family

## 2018-06-06 DIAGNOSIS — R319 Hematuria, unspecified: Secondary | ICD-10-CM

## 2018-06-06 NOTE — Telephone Encounter (Signed)
Pt called and stated she also needs to know her kidney function from the 12/2017 physical. Stated it is okay to leave another VM with information. Please advise. CB#[id number]

## 2018-06-06 NOTE — Telephone Encounter (Signed)
Attempted to notify pt of below and left detailed message on her voicemail that I am mailing copy of 10/4079 metabolic panel (liver functions) to her and that they were normal at that time. I also checked with Dr Antonieta Pert office and they have her scheduled for 06/15/18 at 1:30pm for labs then office visit with Laverna Peace, PA at 1:45 and left their phone number if this appt will not work for her.  Also advised her that she does not need to see Percell Miller before OV with specialist and she should keep her upcoming appt with Dr Charlett Blake in March and to call if she has further questions.

## 2018-06-06 NOTE — Telephone Encounter (Signed)
Verbal result given to pt and I reminded her that these will be included in the results that I have mailed to her.

## 2018-06-07 ENCOUNTER — Telehealth: Payer: Self-pay | Admitting: Family Medicine

## 2018-06-07 NOTE — Telephone Encounter (Signed)
Copied from [place] [phone]. Topic: General - Inquiry >> Jun 07, 2018 11:09 AM Virl Axe D wrote: Reason for CRM: Pt called for lab results. NT unavailable. Please advise. Ok to leave VM

## 2018-06-07 NOTE — Telephone Encounter (Signed)
Patient notified- see lab notes

## 2018-06-09 ENCOUNTER — Ambulatory Visit: Payer: Self-pay

## 2018-06-09 NOTE — Telephone Encounter (Signed)
Pt called back in to make provider aware that she will be in to have repeat lab completed in a week. Pt says that she know that she told Judson Roch (CMA) that she would follow up with Urology. Pt says that she would rather have primary care assistance.

## 2018-06-09 NOTE — Telephone Encounter (Signed)
ret'd call to pt.  Requested information on possible causes of elevated Vita B12 levels.  Stated she was taking 5000 mcg. Daily for about a year, and has d/c'd it since being informed of the high level.    Attempted to answer questions.  Has appt. Scheduled with Hematologist next week and verbalized feeling anxious leading up to the appt.  Encouraged to write questions down and to bring them to her Hematology appt.  Agreed with plan.     Reason for Disposition . Health Information question, no triage required and triager able to answer question  Answer Assessment - Initial Assessment Questions 1. REASON FOR CALL or QUESTION: "What is your reason for calling today?" or "How can I best help you?" or "What question do you have that I can help answer?"     Asking about causes of high B12 levels.  Protocols used: INFORMATION ONLY CALL-A-AH  Message from Jessica L Ilderton sent at 06/09/2018 12:43 PM EST   Summary: b12    Pt called and stated that she would like a call back to discuss b12. Please advise           

## 2018-06-10 NOTE — Telephone Encounter (Signed)
Noted  We will repeat urine here then.

## 2018-06-13 ENCOUNTER — Ambulatory Visit: Payer: 59 | Admitting: Family Medicine

## 2018-06-14 ENCOUNTER — Other Ambulatory Visit: Payer: Self-pay | Admitting: Family

## 2018-06-14 DIAGNOSIS — R7989 Other specified abnormal findings of blood chemistry: Secondary | ICD-10-CM

## 2018-06-15 ENCOUNTER — Other Ambulatory Visit: Payer: Self-pay | Admitting: *Deleted

## 2018-06-15 ENCOUNTER — Inpatient Hospital Stay: Payer: 59

## 2018-06-15 ENCOUNTER — Encounter: Payer: Self-pay | Admitting: Family

## 2018-06-15 ENCOUNTER — Other Ambulatory Visit: Payer: Self-pay

## 2018-06-15 ENCOUNTER — Inpatient Hospital Stay: Payer: 59 | Attending: Family | Admitting: Family

## 2018-06-15 VITALS — BP 103/56 | HR 85 | Temp 97.6°F | Ht 59.0 in | Wt 106.5 lb

## 2018-06-15 DIAGNOSIS — Z8049 Family history of malignant neoplasm of other genital organs: Secondary | ICD-10-CM | POA: Insufficient documentation

## 2018-06-15 DIAGNOSIS — F419 Anxiety disorder, unspecified: Secondary | ICD-10-CM | POA: Insufficient documentation

## 2018-06-15 DIAGNOSIS — N951 Menopausal and female climacteric states: Secondary | ICD-10-CM

## 2018-06-15 DIAGNOSIS — R790 Abnormal level of blood mineral: Secondary | ICD-10-CM | POA: Diagnosis not present

## 2018-06-15 DIAGNOSIS — Z79899 Other long term (current) drug therapy: Secondary | ICD-10-CM | POA: Diagnosis not present

## 2018-06-15 DIAGNOSIS — R7989 Other specified abnormal findings of blood chemistry: Secondary | ICD-10-CM

## 2018-06-15 DIAGNOSIS — Z801 Family history of malignant neoplasm of trachea, bronchus and lung: Secondary | ICD-10-CM | POA: Diagnosis not present

## 2018-06-15 DIAGNOSIS — Z806 Family history of leukemia: Secondary | ICD-10-CM | POA: Insufficient documentation

## 2018-06-15 LAB — CMP (CANCER CENTER ONLY)
ALBUMIN: 4.2 g/dL (ref 3.5–5.0)
ALT: 10 U/L (ref 0–44)
AST: 15 U/L (ref 15–41)
Alkaline Phosphatase: 81 U/L (ref 38–126)
Anion gap: 5 (ref 5–15)
BUN: 13 mg/dL (ref 6–20)
CHLORIDE: 104 mmol/L (ref 98–111)
CO2: 29 mmol/L (ref 22–32)
Calcium: 9.2 mg/dL (ref 8.9–10.3)
Creatinine: 0.68 mg/dL (ref 0.44–1.00)
GFR, Est AFR Am: >60 mL/min (ref >60)
GFR, Estimated: >60 mL/min (ref >60)
GLUCOSE: 159 mg/dL — AB (ref 70–99)
Potassium: 3.5 mmol/L (ref 3.5–5.1)
Sodium: 138 mmol/L (ref 135–145)
Total Bilirubin: 0.5 mg/dL (ref 0.3–1.2)
Total Protein: 6.6 g/dL (ref 6.5–8.1)

## 2018-06-15 LAB — CBC WITH DIFFERENTIAL (CANCER CENTER ONLY)
Abs Immature Granulocytes: 0.01 10*3/uL (ref 0.00–0.07)
BASOS PCT: 1 %
Basophils Absolute: 0 10*3/uL (ref 0.0–0.1)
Eosinophils Absolute: 0.1 10*3/uL (ref 0.0–0.5)
Eosinophils Relative: 2 %
HCT: 37.4 % (ref 36.0–46.0)
Hemoglobin: 12.9 g/dL (ref 12.0–15.0)
Immature Granulocytes: 0 %
Lymphocytes Relative: 31 %
Lymphs Abs: 1.4 10*3/uL (ref 0.7–4.0)
MCH: 33.3 pg (ref 26.0–34.0)
MCHC: 34.5 g/dL (ref 30.0–36.0)
MCV: 96.6 fL (ref 80.0–100.0)
Monocytes Absolute: 0.4 10*3/uL (ref 0.1–1.0)
Monocytes Relative: 8 %
NEUTROS PCT: 58 %
Neutro Abs: 2.6 10*3/uL (ref 1.7–7.7)
Platelet Count: 287 10*3/uL (ref 150–400)
RBC: 3.87 MIL/uL (ref 3.87–5.11)
RDW: 11.6 % (ref 11.5–15.5)
WBC Count: 4.6 10*3/uL (ref 4.0–10.5)
nRBC: 0 % (ref 0.0–0.2)

## 2018-06-15 LAB — VITAMIN B12: Vitamin B-12: 884 pg/mL (ref 180–914)

## 2018-06-15 LAB — SAVE SMEAR(SSMR), FOR PROVIDER SLIDE REVIEW
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
## 2018-06-16 ENCOUNTER — Telehealth: Payer: Self-pay | Admitting: Family

## 2018-06-16 LAB — LACTATE DEHYDROGENASE: LDH: 147 U/L (ref 98–192)

## 2018-06-16 NOTE — Telephone Encounter (Signed)
No los 2/19 °

## 2018-06-23 DIAGNOSIS — N950 Postmenopausal bleeding: Secondary | ICD-10-CM | POA: Diagnosis not present

## 2018-07-01 DIAGNOSIS — N950 Postmenopausal bleeding: Secondary | ICD-10-CM | POA: Diagnosis not present

## 2018-07-04 ENCOUNTER — Ambulatory Visit: Payer: 59 | Admitting: Family Medicine

## 2018-07-04 ENCOUNTER — Encounter: Payer: Self-pay | Admitting: Family Medicine

## 2018-07-04 VITALS — BP 108/62 | HR 77 | Temp 98.4°F | Resp 18 | Wt 107.6 lb

## 2018-07-04 DIAGNOSIS — D219 Benign neoplasm of connective and other soft tissue, unspecified: Secondary | ICD-10-CM

## 2018-07-04 DIAGNOSIS — N951 Menopausal and female climacteric states: Secondary | ICD-10-CM

## 2018-07-04 DIAGNOSIS — E785 Hyperlipidemia, unspecified: Secondary | ICD-10-CM | POA: Diagnosis not present

## 2018-07-04 DIAGNOSIS — K219 Gastro-esophageal reflux disease without esophagitis: Secondary | ICD-10-CM | POA: Diagnosis not present

## 2018-07-04 DIAGNOSIS — R009 Unspecified abnormalities of heart beat: Secondary | ICD-10-CM

## 2018-07-04 DIAGNOSIS — R030 Elevated blood-pressure reading, without diagnosis of hypertension: Secondary | ICD-10-CM

## 2018-07-04 NOTE — Patient Instructions (Signed)

## 2018-07-05 ENCOUNTER — Ambulatory Visit: Payer: 59 | Admitting: Family Medicine

## 2018-07-07 ENCOUNTER — Telehealth: Payer: Self-pay

## 2018-07-07 ENCOUNTER — Other Ambulatory Visit: Payer: Self-pay

## 2018-07-07 ENCOUNTER — Ambulatory Visit: Payer: 59 | Admitting: Internal Medicine

## 2018-07-07 ENCOUNTER — Encounter: Payer: Self-pay | Admitting: Internal Medicine

## 2018-07-07 VITALS — BP 100/72 | HR 81 | Ht 59.0 in | Wt 108.2 lb

## 2018-07-07 DIAGNOSIS — J302 Other seasonal allergic rhinitis: Secondary | ICD-10-CM | POA: Diagnosis not present

## 2018-07-07 DIAGNOSIS — J4520 Mild intermittent asthma, uncomplicated: Secondary | ICD-10-CM | POA: Diagnosis not present

## 2018-07-07 DIAGNOSIS — K219 Gastro-esophageal reflux disease without esophagitis: Secondary | ICD-10-CM

## 2018-07-07 DIAGNOSIS — J3089 Other allergic rhinitis: Secondary | ICD-10-CM

## 2018-07-07 MED ORDER — PHENYLEPHRINE HCL 1 % NA SOLN
3.0000 [drp] | Freq: Once | NASAL | Status: AC
  Administered 2018-07-07: 3 [drp] via NASAL

## 2018-07-07 NOTE — Patient Instructions (Signed)
Order- neb neo nasal      Dx seasonal and perennial allergic rhinitis  Ok to use flonase and a daytime non-sedating antihistamine if needed for drainage, like claritin  Ok to use the hydroxyzine at bedtime  Please call as needed

## 2018-07-07 NOTE — Progress Notes (Signed)
Patient ID: [name], female    DOB: November 08, 1965, 53 y.o.   MRN: [id number]  HPI female never smoker followed for allergic rhinitis, asthma. Complicated by History anxiety, GERD, IBS Office Spirometry 08/01/2015-within normal. FVC 3.02/112%, FEV1 2.36/104%, FEV1/FVC 0.78, FEF 25-75 percent 2.26/84%. PFT 11/26/2010 within normal limits within significant response to bronchodilator. FEV1/FVC 0.79  -------------------------------------------------------------------------------- 12/13/2017-  53 year old female never smoker followed for allergic rhinitis, asthma.  Complicated by GERD, history anxiety, IBS -----asthma: 12 month follow up. Per patient, she has some increased chest congestion for the past few weeks.  She had taken amoxicillin for H.pylori management and describes side effects including hyperventilation but could be consistent with her self-admitted chronic anxiety, but did not sound like an allergic reaction.  Since that treatment she has had some increased cough and postnasal drip with scant clear mucus over the last couple of weeks.  She describes mold exposure in her workplace.  Employer has installed an air cleaner but she is interested in getting a small one for her desk space as well.  We also discussed the current ragweed pollen season and potential for air quality problems at this time of year as additional factors that might affect her breathing.  She has not had active wheezing. CXR 11/25/2017- IMPRESSION: No acute abnormalities.  07/07/2018  53 year old female never smoker followed for allergic rhinitis, asthma.  Complicated by GERD, history anxiety, IBS -----feels heaviness in chest, dry cough; headaches & pressure in head, nasal dripping; states she thinks it's seasonal allergy-related Managed for uterine fibroid, then took abs for H.pylori.  C/O intermittent headache, rhinorrhea, nasal stuffiness. Has air cleaner at her desk, but feels worse in the musty building where  she works.  Has been given hydroxyzine for sleep an menopausal symptoms. Denies wheeze. CXR 05/05/2018 No rib fractures or bone destruction identified. No acute abnormalities.   Review of Systems-see HPI     + = positive Constitutional:   No-   weight loss, night sweats, fevers, chills, fatigue, lassitude. HEENT:    headaches, difficulty swallowing, tooth/dental problems, sore throat,       No-  sneezing, itching, ear ache, nasal congestion, +post nasal drip,  CV:  No-   chest pain, orthopnea, PND, swelling in lower extremities, anasarca, dizziness, palpitations Resp: shortness of breath with exertion or at rest.                non-productive cough,  No- coughing up of blood.                 change in color of mucus.  No recent  wheezing.   Skin: No-   rash or lesions. GI:  No-   heartburn, indigestion, abdominal pain, nausea, vomiting, GU:  MS:  No-   joint pain or swelling.   Neuro-     nothing unusual Psych:  No- change in mood or affect. No depression + anxiety.  No memory loss.  Objective:   Physical Exam General- Alert, Oriented, Affect-anxious/ talkative, Distress- none acute, trim, petite woman    Skin- rash-none, lesions- none, excoriation- none Lymphadenopathy- none Head- atraumatic            Eyes- Gross vision intact, PERRLA, conjunctivae clear secretions            Ears- Hearing, canals normal. +Scarring or sclerosis TMs            Nose- +Narrow with turbinate edema, clear mucus bridging, No- Septal dev, polyps, erosion, perforation  Throat- Mallampati III , mucosa clear , drainage- none, tonsils- atrophic Neck- flexible , trachea midline, no stridor , thyroid nl, carotid no bruit Chest - symmetrical excursion , unlabored           Heart/CV- RRR , no murmur , no gallop  , no rub, nl s1 s2                           - JVD- none , edema- none, stasis changes- none, varices- none           Lung- clear to P&A, wheeze- none, cough- none , dullness-none, rub- none            Chest wall-  Abd-  Br/ Gen/ Rectal- Not done, not indicated Extrem- cyanosis- none, clubbing, none, atrophy- none, strength- nl Neuro- grossly intact to observation

## 2018-07-07 NOTE — Telephone Encounter (Signed)
Received a call from patient she states she is having increased SOB, dry cough, and runny nose for the last month. She denies taking anything OTC. She states she normally comes in for a breathing treatment around this time. She has an app this afternoon but wanted to check and see if she needs to be seen. She states if he thinks she needs to be seen she wil keep her appt but she wanted to be cautious due to everything that is going around and her co-pay beng $50.   Call back # [phone]

## 2018-07-07 NOTE — Telephone Encounter (Signed)
ATC Patient. Left Patient detailed message with Dr Annamaria Boots recommendations. Patient is scheduled with Dr Annamaria Boots at [facility] today.

## 2018-07-07 NOTE — Telephone Encounter (Signed)
If she is concerned and feels she needs a breathing treatment we can see her. If she has had these same symptoms over the past month, without fever and without known exposure to someone sick then it is very unlikely that she has corona virus. This doesn't sound like a situation where we would give an antibiotic.

## 2018-07-07 NOTE — Telephone Encounter (Signed)
Patient has appointment today, 07/07/18, with Dr Annamaria Boots, at 3 pm.  Message routed to Dr Annamaria Boots to advise  Allergies  Allergen Reactions  . Amoxicillin Shortness Of Breath    Doesn't remember rxn  . Aspirin Shortness Of Breath    "irritated stomach" Ibuprofen and Naproxen do not agree with her either  . Codeine Hives and Shortness Of Breath    Does not take percocet or hydrocodone; tylenol only  . Latex Shortness Of Breath and Itching  . Other Shortness Of Breath    peanuts  . Sulfa Antibiotics Hives and Shortness Of Breath  . Sulfamethoxazole-Trimethoprim Shortness Of Breath  . Sulfonamide Derivatives Shortness Of Breath and Itching  . Sulphadimidine Sodium [Sulfamethazine Sodium] Shortness Of Breath    Added to food for preservative  . Ciprofloxacin Other (See Comments)    REACTION: SOB, "Tightness in head" REACTION: SOB, "Tightness in head" Syncope   . Azithromycin Other (See Comments)    Faintness  . Carisoprodol   . Clarithromycin     "Doesn't agree with me"  . Epinephrine     Heart racing  . Metronidazole Other (See Comments)    "cannot tolerate"  . Nsaids   . Penicillins     Doesn't remember reaction  . Tolmetin   . Omeprazole Palpitations   Current Outpatient Medications on File Prior to Visit  Medication Sig Dispense Refill  . fluticasone (FLONASE) 50 MCG/ACT nasal spray Place 2 sprays into both nostrils daily. 16 g 1  . hydrOXYzine (ATARAX/VISTARIL) 10 MG tablet Take 1/2 or 1 tablet every 8 hours as needed for anxiety 30 tablet 0  . Multiple Vitamins-Minerals (MULTIVITAMIN ADULT PO) Take 1 tablet by mouth daily.    Marland Kitchen OVER THE COUNTER MEDICATION Calcium    . Probiotic Product (PROBIOTIC DAILY PO) Take 1 tablet by mouth daily.    Marland Kitchen terconazole (TERAZOL 7) 0.4 % vaginal cream      No current facility-administered medications on file prior to visit.

## 2018-07-08 ENCOUNTER — Telehealth: Payer: Self-pay | Admitting: *Deleted

## 2018-07-08 NOTE — Telephone Encounter (Signed)
Received Medical records from [facility]; forwarded to provider/SLS 03/13

## 2018-07-10 NOTE — Progress Notes (Signed)
Subjective:    Patient ID: [name], female    DOB: December 11, 1965, 53 y.o.   MRN: [id number]  No chief complaint on file.   HPI Patient is in today for evaluation of worsening perimenopausal symptoms and anxiety.  She acknowledges she has been under a great deal of stress and is having more more trouble with feeling anxious and irritable.  She had an episode of syncope as well.  She also is noting some increased left lower quadrant pain which she is currently having evaluated by her OB/GYN Dr. Nancy Nordmann on.  No fevers or chills.  No severe depression.  She normally feels she can get her anxiety under control but is having trouble managing at this time. Denies CP/palp/SOB/HA/congestion/fevers/GI or GU c/o. Taking meds as prescribed  Past Medical History:  Diagnosis Date  . Allergic rhinitis   . Anemia   . Anxiety   . Asthma   . Contact dermatitis and eczema   . Diverticulitis    CT Scan  . Esophageal reflux   . H. pylori infection   . Hyperlipidemia, mixed   . IBS (irritable bowel syndrome)   . IC (interstitial cystitis)   . Internal hemorrhoids   . Lumbago   . Mitral valve disorders(424.0)   . Osteoarthritis 07/08/2015  . Post concussive syndrome     Past Surgical History:  Procedure Laterality Date  . BARTHOLIN CYST MARSUPIALIZATION N/A 12/18/2013   Procedure: BARTHOLIN CYST MARSUPIALIZATION WITH BIOPSY;  Surgeon: Lovenia Kim, MD;  Location: [facility];  Service: Gynecology;  Laterality: N/A;  . CESAREAN SECTION    . DILITATION & CURRETTAGE/HYSTROSCOPY WITH NOVASURE ABLATION N/A 10/14/2012   Procedure: DILATATION & CURETTAGE/HYSTEROSCOPY WITH NOVASURE ABLATION;  Surgeon: Lovenia Kim, MD;  Location: [facility];  Service: Gynecology;  Laterality: N/A;  . MOUTH SURGERY    . SINUS SURGERY WITH INSTATRAK      Family History  Problem Relation Age of Onset  . Colon polyps Sister   . Mental illness Sister        anxiety from 9/11 survivors  . Varicose Veins Sister   .  Diabetes Mother   . Hypertension Mother   . Heart attack Mother        MI at age 78  . Alcohol abuse Mother        smoker  . Heart disease Mother   . Dementia Mother   . Hypertension Father   . Hyperlipidemia Father   . Heart attack Father   . Cancer Father        lung cancer, smoker  . Cancer Brother        acute leukemia  . Dementia Maternal Grandmother   . Heart disease Maternal Grandfather        mi  . Cancer Paternal Grandmother        GYN cancer  . Other Sister        hypoglycemia  . Varicose Veins Sister   . Rheum arthritis Sister   . Pleurisy Sister   . Fibromyalgia Sister   . Mental retardation Sister        depression  . GER disease Son   . Alcohol abuse Other        Family history  . Colon cancer Neg Hx   . Breast cancer Neg Hx     Social History   Socioeconomic History  . Marital status: Married    Spouse name: Not on file  . Number of children: 2  .  Years of education: Masters  . Highest education level: Not on file  Occupational History  . Occupation: [employer]  Social Needs  . Financial resource strain: Not on file  . Food insecurity:    Worry: Not on file    Inability: Not on file  . Transportation needs:    Medical: Not on file    Non-medical: Not on file  Tobacco Use  . Smoking status: Never Smoker  . Smokeless tobacco: Never Used  Substance and Sexual Activity  . Alcohol use: Yes    Comment: Holidays only  . Drug use: No  . 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 Physical activity:    Days per week: Not on file    Minutes per session: Not on file  . Stress: Not on file  Relationships  . Social connections:    Talks on phone: Not on file    Gets together: Not on file    Attends religious service: Not on file    Active member of club or organization: Not on file    Attends meetings of clubs or organizations: Not on file     Relationship status: Not on file  . Intimate partner violence:    Fear of current or ex partner: Not on file    Emotionally abused: Not on file    Physically abused: Not on file    Forced sexual activity: Not on file  Other Topics Concern  . Not on file  Social History Narrative   Lives at home home with husband and son.   Right-handed.   1 cup caffeine daily.    Outpatient Medications Prior to Visit  Medication Sig Dispense Refill  . fluticasone (FLONASE) 50 MCG/ACT nasal spray Place 2 sprays into both nostrils daily. 16 g 1  . hydrOXYzine (ATARAX/VISTARIL) 10 MG tablet Take 1/2 or 1 tablet every 8 hours as needed for anxiety 30 tablet 0  . Multiple Vitamins-Minerals (MULTIVITAMIN ADULT PO) Take 1 tablet by mouth daily.    Marland Kitchen OVER THE COUNTER MEDICATION Calcium    . Probiotic Product (PROBIOTIC DAILY PO) Take 1 tablet by mouth daily.    . fluticasone (FLONASE) 50 MCG/ACT nasal spray Place 2 sprays into both nostrils daily. 16 g 1  . terconazole (TERAZOL 7) 0.4 % vaginal cream      No facility-administered medications prior to visit.     Allergies  Allergen Reactions  . Amoxicillin Shortness Of Breath    Doesn't remember rxn  . Aspirin Shortness Of Breath    "irritated stomach" Ibuprofen and Naproxen do not agree with her either  . Codeine Hives and Shortness Of Breath    Does not take percocet or hydrocodone; tylenol only  . Latex Shortness Of Breath and Itching  . Other Shortness Of Breath    peanuts  . Sulfa Antibiotics Hives and Shortness Of Breath  . Sulfamethoxazole-Trimethoprim Shortness Of Breath  . Sulfonamide Derivatives Shortness Of Breath and Itching  . Sulphadimidine Sodium [Sulfamethazine Sodium] Shortness Of Breath    Added to food for preservative  . Ciprofloxacin Other (See Comments)    REACTION: SOB, "Tightness in head" REACTION: SOB, "Tightness in head" Syncope   . Azithromycin Other (See Comments)    Faintness  . Carisoprodol   . Clarithromycin      "Doesn't agree with me"  . Epinephrine     Heart  racing  . Metronidazole Other (See Comments)    "cannot tolerate"  . Nsaids   . Penicillins     Doesn't remember reaction  . Tolmetin   . Omeprazole Palpitations    Review of Systems  Constitutional: Positive for malaise/fatigue. Negative for fever.  HENT: Negative for congestion.   Eyes: Negative for blurred vision.  Respiratory: Negative for shortness of breath.   Cardiovascular: Negative for chest pain, palpitations and leg swelling.  Gastrointestinal: Positive for abdominal pain. Negative for blood in stool and nausea.  Genitourinary: Negative for dysuria and frequency.  Musculoskeletal: Negative for falls.  Skin: Negative for rash.  Neurological: Negative for dizziness, loss of consciousness and headaches.  Endo/Heme/Allergies: Negative for environmental allergies.  Psychiatric/Behavioral: Negative for depression. The patient is nervous/anxious.        Objective:    Physical Exam Vitals signs and nursing note reviewed.  Constitutional:      General: She is not in acute distress.    Appearance: She is well-developed.  HENT:     Head: Normocephalic and atraumatic.     Nose: Nose normal.  Eyes:     General:        Right eye: No discharge.        Left eye: No discharge.  Neck:     Musculoskeletal: Normal range of motion and neck supple.  Cardiovascular:     Rate and Rhythm: Normal rate and regular rhythm.     Heart sounds: No murmur.  Pulmonary:     Effort: Pulmonary effort is normal.     Breath sounds: Normal breath sounds.  Abdominal:     General: Bowel sounds are normal. There is no distension.     Palpations: Abdomen is soft. There is no mass.     Tenderness: There is no guarding.  Skin:    General: Skin is warm and dry.  Neurological:     Mental Status: She is alert and oriented to person, place, and time.     BP 108/62 (BP Location: Left Arm, Patient Position: Sitting, Cuff Size: Normal)   Pulse  77   Temp 98.4 F (36.9 C) (Oral)   Resp 18   Wt 107 lb 9.6 oz (48.8 kg)   SpO2 98%   BMI 21.73 kg/m  Wt Readings from Last 3 Encounters:  07/07/18 108 lb 3.2 oz (49.1 kg)  07/04/18 107 lb 9.6 oz (48.8 kg)  06/15/18 106 lb 8 oz (48.3 kg)     Lab Results  Component Value Date   WBC 4.6 06/15/2018   HGB 12.9 06/15/2018   HCT 37.4 06/15/2018   PLT 287 06/15/2018   GLUCOSE 159 (H) 06/15/2018   CHOL 205 (H) 12/28/2017   TRIG 176.0 (H) 12/28/2017   HDL 47.70 12/28/2017   LDLDIRECT 142.7 03/02/2012   LDLCALC 123 (H) 12/28/2017   ALT 10 06/15/2018   AST 15 06/15/2018   NA 138 06/15/2018   K 3.5 06/15/2018   CL 104 06/15/2018   CREATININE 0.68 06/15/2018   BUN 13 06/15/2018   CO2 29 06/15/2018   TSH 1.80 12/28/2017   HGBA1C 5.4 01/17/2018    Lab Results  Component Value Date   TSH 1.80 12/28/2017   Lab Results  Component Value Date   WBC 4.6 06/15/2018   HGB 12.9 06/15/2018   HCT 37.4 06/15/2018   MCV 96.6 06/15/2018   PLT 287 06/15/2018   Lab Results  Component Value Date   NA 138 06/15/2018   K 3.5  06/15/2018   CO2 29 06/15/2018   GLUCOSE 159 (H) 06/15/2018   BUN 13 06/15/2018   CREATININE 0.68 06/15/2018   BILITOT 0.5 06/15/2018   ALKPHOS 81 06/15/2018   AST 15 06/15/2018   ALT 10 06/15/2018   PROT 6.6 06/15/2018   ALBUMIN 4.2 06/15/2018   CALCIUM 9.2 06/15/2018   ANIONGAP 5 06/15/2018   GFR 84.71 12/28/2017   Lab Results  Component Value Date   CHOL 205 (H) 12/28/2017   Lab Results  Component Value Date   HDL 47.70 12/28/2017   Lab Results  Component Value Date   LDLCALC 123 (H) 12/28/2017   Lab Results  Component Value Date   TRIG 176.0 (H) 12/28/2017   Lab Results  Component Value Date   CHOLHDL 4 12/28/2017   Lab Results  Component Value Date   HGBA1C 5.4 01/17/2018       Assessment & Plan:   Problem List Items Addressed This Visit    ESOPHAGEAL REFLUX    Avoid offending foods, start probiotics. Do not eat large meals in  late evening and consider raising head of bed.       Fibroids    Is following with her OB/GYN, Dr Si Raider and are monitoring her LLQ pain, she will let us know if worse.       Menopausal state    She acknowledges her anxiety and irritability is up with her perimenopausal state. She  Has hydroxyzine to use prn for anxiety will try this. Is encouraged to try an SSRI if symptoms persist. Consider counseling. spent 25 minutes in counseling and planning patient care.        Hyperlipidemia, mild    Encouraged heart healthy diet, increase exercise, avoid trans fats, consider a krill oil cap daily       Other Visit Diagnoses    Elevated heart rate with elevated blood pressure without diagnosis of hypertension    -  Primary   Relevant Orders   EKG 12-Lead (Completed)      I am having [name] maintain her Probiotic Product (PROBIOTIC DAILY PO), OVER THE COUNTER MEDICATION, Multiple Vitamins-Minerals (MULTIVITAMIN ADULT PO), fluticasone, and hydrOXYzine.  No orders of the defined types were placed in this encounter.    Penni Homans, MD

## 2018-07-10 NOTE — Assessment & Plan Note (Signed)
Is following with her OB/GYN, Dr Si Raider and are monitoring her LLQ pain, she will let us know if worse.

## 2018-07-10 NOTE — Assessment & Plan Note (Addendum)
She acknowledges her anxiety and irritability is up with her perimenopausal state. She  Has hydroxyzine to use prn for anxiety will try this. Is encouraged to try an SSRI if symptoms persist. Consider counseling. spent 25 minutes in counseling and planning patient care.

## 2018-07-10 NOTE — Assessment & Plan Note (Signed)
Encouraged heart healthy diet, increase exercise, avoid trans fats, consider a krill oil cap daily 

## 2018-07-10 NOTE — Assessment & Plan Note (Signed)
Avoid offending foods, start probiotics. Do not eat large meals in late evening and consider raising head of bed.  

## 2018-07-15 ENCOUNTER — Telehealth: Payer: Self-pay | Admitting: Internal Medicine

## 2018-07-15 DIAGNOSIS — J302 Other seasonal allergic rhinitis: Secondary | ICD-10-CM

## 2018-07-15 DIAGNOSIS — J3089 Other allergic rhinitis: Principal | ICD-10-CM

## 2018-07-15 MED ORDER — ALBUTEROL SULFATE (2.5 MG/3ML) 0.083% IN NEBU: 2.5000 mg | INHALATION_SOLUTION | Freq: Four times a day (QID) | RESPIRATORY_TRACT | 0 refills | Status: DC | PRN

## 2018-07-15 NOTE — Telephone Encounter (Signed)
Pharm [facility]

## 2018-07-15 NOTE — Telephone Encounter (Signed)
Ok to order compressor nebulizer misc                      Albuterol 0.083% neb solution, 75 ml, ref prn      1 neb every 6 hours if needed for wheezing, breathing

## 2018-07-15 NOTE — Telephone Encounter (Signed)
Instructions      Return in about 1 year (around 07/07/2019).  Order- neb neo nasal      Dx seasonal and perennial allergic rhinitis  Ok to use flonase and a daytime non-sedating antihistamine if needed for drainage, like claritin  Ok to use the hydroxyzine at bedtime  Please call as needed     ____________________________  Hulen Skains and spoke with patient, she stated that she has really bad seasonal allergies. Patient usually comes in for a neb treatment however due to [place] patient would like to have a prescription for a nebulizer and solutions to do at home. CY please advise, thank you.    Current Outpatient Medications on File Prior to Visit  Medication Sig Dispense Refill  . fluticasone (FLONASE) 50 MCG/ACT nasal spray Place 2 sprays into both nostrils daily. 16 g 1  . hydrOXYzine (ATARAX/VISTARIL) 10 MG tablet Take 1/2 or 1 tablet every 8 hours as needed for anxiety 30 tablet 0  . Multiple Vitamins-Minerals (MULTIVITAMIN ADULT PO) Take 1 tablet by mouth daily.    Marland Kitchen OVER THE COUNTER MEDICATION Calcium    . Probiotic Product (PROBIOTIC DAILY PO) Take 1 tablet by mouth daily.     No current facility-administered medications on file prior to visit.

## 2018-07-15 NOTE — Telephone Encounter (Signed)
Called and spoke with patient she is aware and verbalized understanding. Orders placed. Nothing further needed.

## 2018-07-18 ENCOUNTER — Telehealth: Payer: Self-pay | Admitting: Internal Medicine

## 2018-07-18 DIAGNOSIS — J4520 Mild intermittent asthma, uncomplicated: Secondary | ICD-10-CM | POA: Diagnosis not present

## 2018-07-18 NOTE — Telephone Encounter (Signed)
Spoke with pt and she stated her husband is picking up her nebulizer machine.  Pt asked for a refill of an inhaler that DR Annamaria Boots had given her.  Looked through her chart and found Qvar 80 from 12/07/16.  Dr Annamaria Boots, please advise if this refill is ok to send in.    Current Outpatient Medications on File Prior to Visit  Medication Sig Dispense Refill  . albuterol (PROVENTIL) (2.5 MG/3ML) 0.083% nebulizer solution Take 3 mLs (2.5 mg total) by nebulization every 6 (six) hours as needed for wheezing or shortness of breath. 75 mL 0  . fluticasone (FLONASE) 50 MCG/ACT nasal spray Place 2 sprays into both nostrils daily. 16 g 1  . hydrOXYzine (ATARAX/VISTARIL) 10 MG tablet Take 1/2 or 1 tablet every 8 hours as needed for anxiety 30 tablet 0  . Multiple Vitamins-Minerals (MULTIVITAMIN ADULT PO) Take 1 tablet by mouth daily.    Marland Kitchen OVER THE COUNTER MEDICATION Calcium    . Probiotic Product (PROBIOTIC DAILY PO) Take 1 tablet by mouth daily.     No current facility-administered medications on file prior to visit.     Last sample 12/07/16.

## 2018-07-19 ENCOUNTER — Encounter: Payer: Self-pay | Admitting: Internal Medicine

## 2018-07-19 MED ORDER — BECLOMETHASONE DIPROP HFA 80 MCG/ACT IN AERB: 2.0000 | INHALATION_SPRAY | Freq: Two times a day (BID) | RESPIRATORY_TRACT | 11 refills | Status: DC

## 2018-07-19 NOTE — Assessment & Plan Note (Signed)
We discussed having nebulizer machine available, anticipating intermittent need.

## 2018-07-19 NOTE — Assessment & Plan Note (Signed)
Patient reports recent treatment for H pylori Emphasized reflux precautions

## 2018-07-19 NOTE — Assessment & Plan Note (Signed)
Exacerbation of allergic rhinitis. Discussed environmental controls. Plan- nasal neb decongestant, flonase

## 2018-07-19 NOTE — Telephone Encounter (Signed)
Spoke with pt. She is aware that this medication will be refilled for her. Rx has been sent in. Nothing further was needed.

## 2018-07-19 NOTE — Telephone Encounter (Signed)
Ok to refill x 1 year if that is the med she wants.

## 2018-07-22 ENCOUNTER — Ambulatory Visit: Payer: Self-pay | Admitting: *Deleted

## 2018-07-22 ENCOUNTER — Telehealth: Payer: Self-pay | Admitting: Cardiology

## 2018-07-22 ENCOUNTER — Other Ambulatory Visit: Payer: Self-pay

## 2018-07-22 ENCOUNTER — Emergency Department (HOSPITAL_BASED_OUTPATIENT_CLINIC_OR_DEPARTMENT_OTHER)
Admission: EM | Admit: 2018-07-22 | Discharge: 2018-07-22 | Disposition: A | Discharge status: Home / Self Care | Payer: 59 | Attending: Emergency Medicine | Admitting: Emergency Medicine

## 2018-07-22 ENCOUNTER — Encounter (HOSPITAL_BASED_OUTPATIENT_CLINIC_OR_DEPARTMENT_OTHER): Payer: Self-pay | Admitting: Adult Health

## 2018-07-22 ENCOUNTER — Other Ambulatory Visit: Payer: Self-pay | Admitting: Family Medicine

## 2018-07-22 DIAGNOSIS — R05 Cough: Secondary | ICD-10-CM

## 2018-07-22 DIAGNOSIS — Z91040 Latex allergy status: Secondary | ICD-10-CM | POA: Diagnosis not present

## 2018-07-22 DIAGNOSIS — R55 Syncope and collapse: Secondary | ICD-10-CM

## 2018-07-22 DIAGNOSIS — Z79899 Other long term (current) drug therapy: Secondary | ICD-10-CM | POA: Insufficient documentation

## 2018-07-22 DIAGNOSIS — J45909 Unspecified asthma, uncomplicated: Secondary | ICD-10-CM | POA: Insufficient documentation

## 2018-07-22 DIAGNOSIS — R059 Cough, unspecified: Secondary | ICD-10-CM

## 2018-07-22 LAB — BASIC METABOLIC PANEL
Anion gap: 6 (ref 5–15)
BUN: 13 mg/dL (ref 6–20)
CO2: 25 mmol/L (ref 22–32)
Calcium: 9 mg/dL (ref 8.9–10.3)
Chloride: 104 mmol/L (ref 98–111)
Creatinine, Ser: 0.51 mg/dL (ref 0.44–1.00)
GFR calc Af Amer: >60 mL/min (ref >60)
GFR calc non Af Amer: >60 mL/min (ref >60)
Glucose, Bld: 107 mg/dL — ABNORMAL HIGH (ref 70–99)
Potassium: 4 mmol/L (ref 3.5–5.1)
Sodium: 135 mmol/L (ref 135–145)

## 2018-07-22 LAB — CBC WITH DIFFERENTIAL/PLATELET
Abs Immature Granulocytes: 0.02 10*3/uL (ref 0.00–0.07)
Basophils Absolute: 0 10*3/uL (ref 0.0–0.1)
Basophils Relative: 1 %
Eosinophils Absolute: 0.1 10*3/uL (ref 0.0–0.5)
Eosinophils Relative: 2 %
HCT: 37.2 % (ref 36.0–46.0)
Hemoglobin: 12.8 g/dL (ref 12.0–15.0)
Immature Granulocytes: 1 %
Lymphocytes Relative: 22 %
Lymphs Abs: 0.9 10*3/uL (ref 0.7–4.0)
MCH: 33.4 pg (ref 26.0–34.0)
MCHC: 34.4 g/dL (ref 30.0–36.0)
MCV: 97.1 fL (ref 80.0–100.0)
Monocytes Absolute: 0.5 10*3/uL (ref 0.1–1.0)
Monocytes Relative: 11 %
Neutro Abs: 2.7 10*3/uL (ref 1.7–7.7)
Neutrophils Relative %: 63 %
Platelets: 315 10*3/uL (ref 150–400)
RBC: 3.83 MIL/uL — AB (ref 3.87–5.11)
RDW: 11.5 % (ref 11.5–15.5)
WBC: 4.2 10*3/uL (ref 4.0–10.5)
nRBC: 0 % (ref 0.0–0.2)

## 2018-07-22 LAB — PREGNANCY, URINE: PREG TEST UR: NEGATIVE

## 2018-07-22 MED ORDER — HYDROCODONE-HOMATROPINE 5-1.5 MG/5ML PO SYRP: 5.0000 mL | ORAL_SOLUTION | Freq: Three times a day (TID) | ORAL | 0 refills | Status: DC | PRN

## 2018-07-22 NOTE — Telephone Encounter (Unsigned)
Copied from [place] [id number]. Topic: Appointment Scheduling - Scheduling Inquiry for Clinic >> Jul 22, 2018 12:09 PM Selinda Flavin B, NT wrote: Reason for CRM: Patient calling and states that she was seen in the ER today for passing out in her bathroom after having a coughing fit. States that she was advised by ER provider to follow up with PCP. Patient would like to know if Dr Gracelyn Nurse could look at the notes and then advise on whether she needs to be seen at the office? Please advise.

## 2018-07-22 NOTE — Telephone Encounter (Signed)
Can schedule event monitor if recurs; sounds likely to be cough syncope Kirk Ruths

## 2018-07-22 NOTE — Telephone Encounter (Signed)
Let her know we are unable to see respiratory cases in office presently due to no access to PPE but I could send in a cough syrup like Hydromet for the weekend and then see her in a virtual visit at the beginning of the week then inform her on isolation protocol and of course if she gets worse she should go to the ER to be reevaluated if she gets SOB or notably worse

## 2018-07-22 NOTE — ED Triage Notes (Signed)
Presents with near syncopal episode that began this AM. She woke up and ate breakfast this AM, then developed abdominal cramping, she went to the bathroom to have a BM and had diarrhea, she became lightheaded and sweaty and had to lay down on the bathroom floor. As she was feeling better she tried to get up and it happened again. She reports that she has had a cough and asthma symptoms for a few days. She denies fevers reports chills. She is endorses extreme anxiety.

## 2018-07-22 NOTE — Telephone Encounter (Signed)
New Message:     Pt says she have been having syncope episodes. This morning  She had a coughing spell and collapsed. She went to the ER and was discharged, They told her to see Dr Stanford Breed. She wants to know when can she be seen?

## 2018-07-22 NOTE — Telephone Encounter (Signed)
Patient was in the bathroom this morning- she had aggressive coughing attack ( she was spitting a lot of drainage) and she got dizzy. Patient then was using the bathroom  and felt dizzy and passed out- weakness. Patient rested and recovered- she was able to get up. No fever reported. Patient is concerned that she may have virus- even though she does not have high risk symptoms. Told patient that with her fainting - protocol directs her to be checked at office or ED- but during this time the office is doing more telephone/virtual visits and they may direct her to the ED- she states she wants to go there anyway if she can't get tested at office. Patient is going to ED.    Reason for Disposition . [1] Age > 50 years  AND [2] now alert and feels fine  Answer Assessment - Initial Assessment Questions 1. ONSET: "How long were you unconscious?" (minutes) "When did it happen?"     Few seconds- this morning-7:30 am 2. CONTENT: "What happened during period of unconsciousness?" (e.g., seizure activity)      Patient was alone- felt weak and dizzy 3. MENTAL STATUS: "Alert and oriented now?" (oriented x 3 = name, month, location)      Alert and oreinted 4. TRIGGER: "What do you think caused the fainting?" "What were you doing just before you fainted?"  (e.g., exercise, sudden standing up, prolonged standing)     Coughing , anxiety, upper respiratory 5. RECURRENT SYMPTOM: "Have you ever passed out before?" If so, ask: "When was the last time?" and "What happened that time?"      Yes- stress and anxiety- couple months ago- nerve related 6. INJURY: "Did you sustain any injury during the fall?"      No injury- patient felt it coming 7. CARDIAC SYMPTOMS: "Have you had any of the following symptoms: chest pain, difficulty breathing, palpitations?"     Feels racy, heaviness in chest at times 8. NEUROLOGIC SYMPTOMS: "Have you had any of the following symptoms: headache, numbness, vertigo, weakness?"     Headache  recently on and off- 2 weeks, weakness this morning 9. GI SYMPTOMS: "Have you had any of the following symptoms: abdominal pain, vomiting, diarrhea, blood in stools?"     Abdominal pain- loose stool this morning 10. OTHER SYMPTOMS: "Do you have any other symptoms?"       Throat feels"thight- heavy" 11. PREGNANCY: "Is there any chance you are pregnant?" "When was your last menstrual period?"       No- LMP- 1 week ago  Protocols used: [facility]

## 2018-07-22 NOTE — Telephone Encounter (Signed)
Spoke with [name], she had a coughing spell, had a stomach ache and was going to the bathroom. Coughing became so hard she passed out. She was sent to the ER and everything was fine. She was told to follow up with [place]. She was told in the ER she may need a monitor. She was unconscious for only a few seconds and she tried to get up she was not able to because her body felt so heavy. She has never felt this way from passing out before. She spit up sinus drainage. She gradually got her energy back up and her PCP sent her to hospital. This happened 1 month ago from panic and passed out. At that time she urinated on herself. She is very nervous. Will forward for dr Stanford Breed review

## 2018-07-22 NOTE — Telephone Encounter (Signed)
Spoke with patient she agreed with the cough syrup can you send in to [facility] please advise

## 2018-07-22 NOTE — Discharge Instructions (Signed)
Your lab work here was unremarkable.  Your EKG was also normal.  Please discuss your visit with your family doctor.  Eat and drink well for the next 48 hours.  Return for chest pain shortness of breath or recurrent episode.

## 2018-07-22 NOTE — Telephone Encounter (Signed)
Just an FYI

## 2018-07-22 NOTE — Telephone Encounter (Signed)
Spoke with pt, Aware of dr crenshaw's recommendations.  °

## 2018-07-22 NOTE — ED Provider Notes (Signed)
[facility] EMERGENCY DEPARTMENT Provider Note   CSN: [id number] Arrival date & time: 07/22/18  2119    History   Chief Complaint Chief Complaint  Patient presents with  . Near Syncope    HPI [name] is a 53 y.o. female.     53 yo F with a chief complaint of a syncopal event.  Patient states that she had a coughing fit this morning and then suddenly felt like she needed to use the bathroom when she got there she had a large loose bowel movement and felt like she may pass out and then laid on the ground and then did pass out for short period of time.  She then felt weak and she tried to get up and had another episode.  She denied headache neck pain shortness of breath or chest pain with this.  Now feels back to normal.  Denies vomiting but felt nauseated.  She was recently seen by her pulmonologist for her seasonal lung issues and she had deferred her steroid due to the recent coronavirus outbreak as she thought that would leave her more susceptible to infection.  She has been having a mild cough and congestion.  Has a mild diffuse headache.  Denies ear pain denies sore throat denies fever.  The history is provided by the patient.  Near Syncope  Pertinent negatives include no chest pain, no headaches and no shortness of breath.  Illness  Severity:  Moderate Onset quality:  Sudden Duration:  1 minute Timing:  Rare Progression:  Resolved Chronicity:  New Associated symptoms: congestion, cough, diarrhea and nausea   Associated symptoms: no chest pain, no fever, no headaches, no myalgias, no rhinorrhea, no shortness of breath, no vomiting and no wheezing     Past Medical History:  Diagnosis Date  . Allergic rhinitis   . Anemia   . Anxiety   . Asthma   . Contact dermatitis and eczema   . Diverticulitis    CT Scan  . Esophageal reflux   . H. pylori infection   . Hyperlipidemia, mixed   . IBS (irritable bowel syndrome)   . IC (interstitial cystitis)   .  Internal hemorrhoids   . Lumbago   . Mitral valve disorders(424.0)   . Osteoarthritis 07/08/2015  . Post concussive syndrome   . Seasonal and perennial allergic rhinitis 01/20/2015    Patient Active Problem List   Diagnosis Date Noted  . Perimenopausal 01/02/2018  . History of Helicobacter pylori infection 12/28/2017  . Nocturia 07/19/2016  . Left lower quadrant pain 07/19/2016  . Rash and nonspecific skin eruption 03/21/2016  . Osteoarthritis 07/08/2015  . Hematuria 01/20/2015  . Hyperlipidemia, mild 01/20/2015  . Seasonal and perennial allergic rhinitis 01/20/2015  . Fatigue 10/24/2014  . Chest pressure 10/24/2014  . Preventative health care 06/03/2014  . Diverticulosis 07/01/2011  . Menopausal state 03/25/2011  . Fibroids 10/24/2010  . Anemia 03/25/2010  . IRRITABLE BOWEL SYNDROME 03/25/2010  . Mitral valve disorder 07/09/2007  . Allergic asthma, mild intermittent, uncomplicated 41/74/0814  . ESOPHAGEAL REFLUX 07/09/2007  . DERMATITIS 07/09/2007  . LOW BACK PAIN 12/14/2006    Past Surgical History:  Procedure Laterality Date  . BARTHOLIN CYST MARSUPIALIZATION N/A 12/18/2013   Procedure: BARTHOLIN CYST MARSUPIALIZATION WITH BIOPSY;  Surgeon: Lovenia Kim, MD;  Location: [facility];  Service: Gynecology;  Laterality: N/A;  . CESAREAN SECTION    . DILITATION & CURRETTAGE/HYSTROSCOPY WITH NOVASURE ABLATION N/A 10/14/2012   Procedure: DILATATION & CURETTAGE/HYSTEROSCOPY WITH NOVASURE  ABLATION;  Surgeon: Lovenia Kim, MD;  Location: [facility];  Service: Gynecology;  Laterality: N/A;  . MOUTH SURGERY    . SINUS SURGERY WITH INSTATRAK       OB History   No obstetric history on file.      Home Medications    Prior to Admission medications   Medication Sig Start Date End Date Taking? Authorizing Provider  albuterol (PROVENTIL) (2.5 MG/3ML) 0.083% nebulizer solution Take 3 mLs (2.5 mg total) by nebulization every 6 (six) hours as needed for wheezing or shortness of breath.  07/15/18   Baird Lyons D, MD  beclomethasone (QVAR REDIHALER) 80 MCG/ACT inhaler Inhale 2 puffs into the lungs 2 (two) times daily. 07/19/18   Deneise Lever, MD  fluticasone (FLONASE) 50 MCG/ACT nasal spray Place 2 sprays into both nostrils daily. 01/13/18   Saguier, Percell Miller, PA-C  hydrOXYzine (ATARAX/VISTARIL) 10 MG tablet Take 1/2 or 1 tablet every 8 hours as needed for anxiety 06/04/18   Guse, Jacquelynn Cree, FNP  Multiple Vitamins-Minerals (MULTIVITAMIN ADULT PO) Take 1 tablet by mouth daily.    [provider]  OVER THE COUNTER MEDICATION Calcium    [provider]  Probiotic Product (PROBIOTIC DAILY PO) Take 1 tablet by mouth daily.    [provider]    Family History Family History  Problem Relation Age of Onset  . Colon polyps Sister   . Mental illness Sister        anxiety from 9/11 survivors  . Varicose Veins Sister   . Diabetes Mother   . Hypertension Mother   . Heart attack Mother        MI at age 67  . Alcohol abuse Mother        smoker  . Heart disease Mother   . Dementia Mother   . Hypertension Father   . Hyperlipidemia Father   . Heart attack Father   . Cancer Father        lung cancer, smoker  . Cancer Brother        acute leukemia  . Dementia Maternal Grandmother   . Heart disease Maternal Grandfather        mi  . Cancer Paternal Grandmother        GYN cancer  . Other Sister        hypoglycemia  . Varicose Veins Sister   . Rheum arthritis Sister   . Pleurisy Sister   . Fibromyalgia Sister   . Mental retardation Sister        depression  . GER disease Son   . Alcohol abuse Other        Family history  . Colon cancer Neg Hx   . Breast cancer Neg Hx     Social History Social History   Tobacco Use  . Smoking status: Never Smoker  . Smokeless tobacco: Never Used  Substance Use Topics  . Alcohol use: Yes    Comment: Holidays only  . Drug use: No     Allergies   Amoxicillin; Aspirin; Codeine; Latex; Other; Sulfa  antibiotics; Sulfamethoxazole-trimethoprim; Sulfonamide derivatives; Sulphadimidine sodium [sulfamethazine sodium]; Ciprofloxacin; Azithromycin; Carisoprodol; Clarithromycin; Epinephrine; Metronidazole; Nsaids; Penicillins; Tolmetin; and Omeprazole   Review of Systems Review of Systems  Constitutional: Negative for chills and fever.  HENT: Positive for congestion. Negative for rhinorrhea.   Eyes: Negative for redness and visual disturbance.  Respiratory: Positive for cough. Negative for shortness of breath and wheezing.   Cardiovascular: Positive for near-syncope. Negative for chest  pain and palpitations.  Gastrointestinal: Positive for diarrhea and nausea. Negative for vomiting.  Genitourinary: Negative for dysuria and urgency.  Musculoskeletal: Negative for arthralgias and myalgias.  Skin: Negative for pallor and wound.  Neurological: Positive for syncope. Negative for dizziness and headaches.     Physical Exam Updated Vital Signs BP (!) 127/53   Pulse 96   Temp 98.6 F (37 C) (Oral)   Resp 18   Ht 4\' 11"  (1.499 m)   Wt 49 kg   SpO2 100%   BMI 21.82 kg/m   Physical Exam Vitals signs and nursing note reviewed.  Constitutional:      General: She is not in acute distress.    Appearance: She is well-developed. She is not diaphoretic.  HENT:     Head: Normocephalic and atraumatic.  Eyes:     Pupils: Pupils are equal, round, and reactive to light.  Neck:     Musculoskeletal: Normal range of motion and neck supple.  Cardiovascular:     Rate and Rhythm: Normal rate and regular rhythm.     Heart sounds: No murmur. No friction rub. No gallop.   Pulmonary:     Effort: Pulmonary effort is normal.     Breath sounds: No wheezing or rales.  Abdominal:     General: There is no distension.     Palpations: Abdomen is soft.     Tenderness: There is no abdominal tenderness.     Comments: Very mild tenderness to the suprapubic region and LLQ  Musculoskeletal:        General: No  tenderness.  Skin:    General: Skin is warm and dry.  Neurological:     Mental Status: She is alert and oriented to person, place, and time.  Psychiatric:        Behavior: Behavior normal.      ED Treatments / Results  Labs (all labs ordered are listed, but only abnormal results are displayed) Labs Reviewed  CBC WITH DIFFERENTIAL/PLATELET - Abnormal; Notable for the following components:      Result Value   RBC 3.83 (*)    All other components within normal limits  BASIC METABOLIC PANEL - Abnormal; Notable for the following components:   Glucose, Bld 107 (*)    All other components within normal limits  PREGNANCY, URINE    EKG EKG Interpretation  Date/Time:  Friday July 22 2018 09:12:44 EDT Ventricular Rate:  94 PR Interval:    QRS Duration: 68 QT Interval:  346 QTC Calculation: 433 R Axis:   84 Text Interpretation:  Sinus rhythm no wpw, prolonged qt or brugada Otherwise no significant change Confirmed by Deno Etienne [id number]) on 07/22/2018 9:16:51 AM   Radiology No results found.  Procedures Procedures (including critical care time)  Medications Ordered in ED Medications - No data to display   Initial Impression / Assessment and Plan / ED Course  I have reviewed the triage vital signs and the nursing notes.  Pertinent labs & imaging results that were available during my care of the patient were reviewed by me and considered in my medical decision making (see chart for details).        53 yo F with a chief complaint of a syncopal event.  Sounds vasovagal by history.  Patient is well-appearing and nontoxic though she is very worried that she is contracted the novel coronavirus.  She has no lower respiratory symptoms.  She is having mostly nasal congestion and one episode of diarrhea.  I  discussed with her that the preliminary research suggests that this is very unlikely to be consistent with that virus.  Will obtain laboratory evaluation to rule out other causes  of syncope.  Lab work returned and is unremarkable, patient is not anemic she has no significant electrolyte abnormality.  Will discharge home.  PCP follow-up.  11:40 AM:  I have discussed the diagnosis/risks/treatment options with the patient and believe the pt to be eligible for discharge home to follow-up with PCP. We also discussed returning to the ED immediately if new or worsening sx occur. We discussed the sx which are most concerning (e.g., sudden worsening pain, fever, inability to tolerate by mouth) that necessitate immediate return. Medications administered to the patient during their visit and any new prescriptions provided to the patient are listed below.  Medications given during this visit Medications - No data to display   The patient appears reasonably screen and/or stabilized for discharge and I doubt any other medical condition or other [facility] requiring further screening, evaluation, or treatment in the ED at this time prior to discharge.    Final Clinical Impressions(s) / ED Diagnoses   Final diagnoses:  Syncope and collapse    ED Discharge Orders    None       Deno Etienne, DO 07/22/18 1140

## 2018-07-22 NOTE — Telephone Encounter (Signed)
Done

## 2018-07-25 ENCOUNTER — Ambulatory Visit (INDEPENDENT_AMBULATORY_CARE_PROVIDER_SITE_OTHER): Payer: 59 | Admitting: Family Medicine

## 2018-07-25 ENCOUNTER — Telehealth: Payer: Self-pay | Admitting: Internal Medicine

## 2018-07-25 ENCOUNTER — Other Ambulatory Visit: Payer: Self-pay

## 2018-07-25 DIAGNOSIS — R55 Syncope and collapse: Secondary | ICD-10-CM | POA: Diagnosis not present

## 2018-07-25 DIAGNOSIS — J4520 Mild intermittent asthma, uncomplicated: Secondary | ICD-10-CM | POA: Diagnosis not present

## 2018-07-25 DIAGNOSIS — R05 Cough: Secondary | ICD-10-CM | POA: Diagnosis not present

## 2018-07-25 DIAGNOSIS — R059 Cough, unspecified: Secondary | ICD-10-CM

## 2018-07-25 NOTE — Telephone Encounter (Signed)
Call made to patient, made aware of CY recommendations. Voiced understanding. Nothing further is needed at this time.

## 2018-07-25 NOTE — Assessment & Plan Note (Signed)
Struggles with cough and allergies each year but is worse with coughing spells, fatigue. Will keep her out of work for the next week. Works for [employer]. Given work note. Has not used the Hydromet. Has gotten Robitussin DM to use prn.

## 2018-07-25 NOTE — Assessment & Plan Note (Signed)
Vasovagal syncope after a coughing spell. Went to ED and was advised after EKG and labwork that her episode was likely vasovagal. No recurrent episodes of syncope. Encouraged to hydrate well and report worsening symptoms.

## 2018-07-25 NOTE — Telephone Encounter (Signed)
Since the Qvar has helped, suggest using 1 puff, twice daily. Don't forget to rinse mouth to prevent yeast irritation.

## 2018-07-25 NOTE — Progress Notes (Signed)
Virtual Visit via Video Note  I connected with [name] on 07/25/18 at  3:30 PM EDT by a video enabled telemedicine application and verified that I am speaking with the correct person using two identifiers.   I discussed the limitations of evaluation and management by telemedicine and the availability of in person appointments. The patient expressed understanding and agreed to proceed.    Subjective:    Patient ID: [name], female    DOB: 08-09-1965, 53 y.o.   MRN: [id number]  No chief complaint on file.   HPI Patient is in today for reevaluation of cough, she works at [employer] and has continued to go to work. Coughing at work. Had a bad coughing spell at home and she had a syncopal episode after a spell. She presented to ER and the work up including EKG, exam and labs was negative. She has discussed with cardiology and they agree it is likely vasovagal. She has had no more syncopal episodes but she does continue to have a cough, congestion, fatigue. Denies CP/palp/SOB/HAfevers/GI or GU c/o. Taking meds as prescribed  Past Medical History:  Diagnosis Date  . Allergic rhinitis   . Anemia   . Anxiety   . Asthma   . Contact dermatitis and eczema   . Diverticulitis    CT Scan  . Esophageal reflux   . H. pylori infection   . Hyperlipidemia, mixed   . IBS (irritable bowel syndrome)   . IC (interstitial cystitis)   . Internal hemorrhoids   . Lumbago   . Mitral valve disorders(424.0)   . Osteoarthritis 07/08/2015  . Post concussive syndrome   . Seasonal and perennial allergic rhinitis 01/20/2015    Past Surgical History:  Procedure Laterality Date  . BARTHOLIN CYST MARSUPIALIZATION N/A 12/18/2013   Procedure: BARTHOLIN CYST MARSUPIALIZATION WITH BIOPSY;  Surgeon: Lovenia Kim, MD;  Location: [facility];  Service: Gynecology;  Laterality: N/A;  . CESAREAN SECTION    . DILITATION & CURRETTAGE/HYSTROSCOPY WITH NOVASURE ABLATION N/A 10/14/2012   Procedure: DILATATION & CURETTAGE/HYSTEROSCOPY WITH NOVASURE ABLATION;  Surgeon: Lovenia Kim, MD;  Location: [facility];  Service: Gynecology;  Laterality: N/A;  . MOUTH SURGERY    . SINUS SURGERY WITH INSTATRAK      Family History  Problem Relation Age of Onset  . Colon polyps Sister   . Mental illness Sister        anxiety from 9/11 survivors  . Varicose Veins Sister   . Diabetes Mother   . Hypertension Mother   . Heart attack Mother        MI at age 48  . Alcohol abuse Mother        smoker  . Heart disease Mother   . Dementia Mother   . Hypertension Father   . Hyperlipidemia Father   . Heart attack Father   . Cancer Father        lung cancer, smoker  . Cancer Brother        acute leukemia  . Dementia Maternal Grandmother   . Heart disease Maternal Grandfather        mi  . Cancer Paternal Grandmother        GYN cancer  . Other Sister        hypoglycemia  . Varicose Veins Sister   . Rheum arthritis Sister   . Pleurisy Sister   . Fibromyalgia Sister   . Mental retardation Sister        depression  .  GER disease Son   . Alcohol abuse Other        Family history  . Colon cancer Neg Hx   . Breast cancer Neg Hx     Social History   Socioeconomic History  . Marital status: Married    Spouse name: Not on file  . Number of children: 2  . Years of education: Masters  . Highest education level: Not on file  Occupational History  . Occupation: [employer]  Social Needs  . Financial resource strain: Not on file  . Food insecurity:    Worry: Not on file    Inability: Not on file  . Transportation needs:    Medical: Not on file    Non-medical: Not on file  Tobacco Use  . Smoking status: Never Smoker  . Smokeless tobacco: Never Used  Substance and Sexual Activity  . Alcohol use: Yes    Comment: Holidays only  . Drug use: No  . Sexual activity: Yes    Comment: lives with husband and son, work with [employer] as a Clinical research associate, aoivd gluten, dairy   Lifestyle  . Physical activity:    Days per week: Not on file    Minutes per session: Not on file  . Stress: Not on file  Relationships  . Social connections:    Talks on phone: Not on file    Gets together: Not on file    Attends religious service: Not on file    Active member of club or organization: Not on file    Attends meetings of clubs or organizations: Not on file    Relationship status: Not on file  . Intimate partner violence:    Fear of current or ex partner: Not on file    Emotionally abused: Not on file    Physically abused: Not on file    Forced sexual activity: Not on file  Other Topics Concern  . Not on file  Social History Narrative   Lives at home home with husband and son.   Right-handed.   1 cup caffeine daily.    Outpatient Medications Prior to Visit  Medication Sig Dispense Refill  . albuterol (PROVENTIL) (2.5 MG/3ML) 0.083% nebulizer solution Take 3 mLs (2.5 mg total) by nebulization every 6 (six) hours as needed for wheezing or shortness of breath. 75 mL 0  . beclomethasone (QVAR REDIHALER) 80 MCG/ACT inhaler Inhale 2 puffs into the lungs 2 (two) times daily. 1 Inhaler 11  . fluticasone (FLONASE) 50 MCG/ACT nasal spray Place 2 sprays into both nostrils daily. 16 g 1  . HYDROcodone-homatropine (HYCODAN) 5-1.5 MG/5ML syrup Take 5 mLs by mouth every 8 (eight) hours as needed for cough. 120 mL 0  . hydrOXYzine (ATARAX/VISTARIL) 10 MG tablet Take 1/2 or 1 tablet every 8 hours as needed for anxiety 30 tablet 0  . Multiple Vitamins-Minerals (MULTIVITAMIN ADULT PO) Take 1 tablet by mouth daily.    Marland Kitchen OVER THE COUNTER MEDICATION Calcium    . Probiotic Product (PROBIOTIC DAILY PO) Take 1 tablet by mouth daily.     No facility-administered medications prior to visit.     Allergies  Allergen Reactions  . Amoxicillin Shortness Of Breath    Doesn't remember rxn  . Aspirin Shortness Of Breath    "irritated stomach" Ibuprofen and Naproxen do not agree with her  either  . Codeine Hives and Shortness Of Breath    Does not take percocet or hydrocodone; tylenol only  . Latex Shortness Of  Breath and Itching  . Other Shortness Of Breath    peanuts  . Sulfa Antibiotics Hives and Shortness Of Breath  . Sulfamethoxazole-Trimethoprim Shortness Of Breath  . Sulfonamide Derivatives Shortness Of Breath and Itching  . Sulphadimidine Sodium [Sulfamethazine Sodium] Shortness Of Breath    Added to food for preservative  . Ciprofloxacin Other (See Comments)    REACTION: SOB, "Tightness in head" REACTION: SOB, "Tightness in head" Syncope   . Azithromycin Other (See Comments)    Faintness  . Carisoprodol   . Clarithromycin     "Doesn't agree with me"  . Epinephrine     Heart racing  . Metronidazole Other (See Comments)    "cannot tolerate"  . Nsaids   . Penicillins     Doesn't remember reaction  . Tolmetin   . Omeprazole Palpitations    Review of Systems  Constitutional: Positive for malaise/fatigue. Negative for fever.  HENT: Positive for congestion.   Eyes: Negative for blurred vision.  Respiratory: Positive for cough, shortness of breath and wheezing.   Cardiovascular: Negative for chest pain, palpitations and leg swelling.  Gastrointestinal: Negative for abdominal pain, blood in stool and nausea.  Genitourinary: Negative for dysuria and frequency.  Musculoskeletal: Negative for falls.  Skin: Negative for rash.  Neurological: Positive for loss of consciousness. Negative for dizziness and headaches.  Endo/Heme/Allergies: Negative for environmental allergies.  Psychiatric/Behavioral: Negative for depression. The patient is not nervous/anxious.        Objective:    Physical Exam Vitals signs reviewed.  Constitutional:      Appearance: Normal appearance.  HENT:     Head: Normocephalic and atraumatic.  Pulmonary:     Effort: Pulmonary effort is normal.  Skin:    Findings: No lesion.  Neurological:     Mental Status: She is alert.      Motor: No weakness.  Psychiatric:        Mood and Affect: Mood normal.        Behavior: Behavior normal.     There were no vitals taken for this visit. Wt Readings from Last 3 Encounters:  07/22/18 108 lb 0.4 oz (49 kg)  07/07/18 108 lb 3.2 oz (49.1 kg)  07/04/18 107 lb 9.6 oz (48.8 kg)    Diabetic Foot Exam - Simple   No data filed     Lab Results  Component Value Date   WBC 4.2 07/22/2018   HGB 12.8 07/22/2018   HCT 37.2 07/22/2018   PLT 315 07/22/2018   GLUCOSE 107 (H) 07/22/2018   CHOL 205 (H) 12/28/2017   TRIG 176.0 (H) 12/28/2017   HDL 47.70 12/28/2017   LDLDIRECT 142.7 03/02/2012   LDLCALC 123 (H) 12/28/2017   ALT 10 06/15/2018   AST 15 06/15/2018   NA 135 07/22/2018   K 4.0 07/22/2018   CL 104 07/22/2018   CREATININE 0.51 07/22/2018   BUN 13 07/22/2018   CO2 25 07/22/2018   TSH 1.80 12/28/2017   HGBA1C 5.4 01/17/2018    Lab Results  Component Value Date   TSH 1.80 12/28/2017   Lab Results  Component Value Date   WBC 4.2 07/22/2018   HGB 12.8 07/22/2018   HCT 37.2 07/22/2018   MCV 97.1 07/22/2018   PLT 315 07/22/2018   Lab Results  Component Value Date   NA 135 07/22/2018   K 4.0 07/22/2018   CO2 25 07/22/2018   GLUCOSE 107 (H) 07/22/2018   BUN 13 07/22/2018   CREATININE 0.51 07/22/2018  BILITOT 0.5 06/15/2018   ALKPHOS 81 06/15/2018   AST 15 06/15/2018   ALT 10 06/15/2018   PROT 6.6 06/15/2018   ALBUMIN 4.2 06/15/2018   CALCIUM 9.0 07/22/2018   ANIONGAP 6 07/22/2018   GFR 84.71 12/28/2017   Lab Results  Component Value Date   CHOL 205 (H) 12/28/2017   Lab Results  Component Value Date   HDL 47.70 12/28/2017   Lab Results  Component Value Date   LDLCALC 123 (H) 12/28/2017   Lab Results  Component Value Date   TRIG 176.0 (H) 12/28/2017   Lab Results  Component Value Date   CHOLHDL 4 12/28/2017   Lab Results  Component Value Date   HGBA1C 5.4 01/17/2018       Assessment & Plan:   Problem List Items Addressed  This Visit    Allergic asthma, mild intermittent, uncomplicated    Using albuterol prn and Qvar 2 puffs po bid but feels shaky and and uncomforatabe with this dose. Will try to drop to 1 puff po bid. Let us know if any concerns      Syncope    Vasovagal syncope after a coughing spell. Went to ED and was advised after EKG and labwork that her episode was likely vasovagal. No recurrent episodes of syncope. Encouraged to hydrate well and report worsening symptoms.       Cough    Struggles with cough and allergies each year but is worse with coughing spells, fatigue. Will keep her out of work for the next week. Works for [employer]. Given work note. Has not used the Hydromet. Has gotten Robitussin DM to use prn.          I am having [name] maintain her Probiotic Product (PROBIOTIC DAILY PO), OVER THE COUNTER MEDICATION, Multiple Vitamins-Minerals (MULTIVITAMIN ADULT PO), fluticasone, hydrOXYzine, albuterol, beclomethasone, and HYDROcodone-homatropine.  No orders of the defined types were placed in this encounter.    Penni Homans, MD   I discussed the assessment and treatment plan with the patient. The patient was provided an opportunity to ask questions and all were answered. The patient agreed with the plan and demonstrated an understanding of the instructions. [name], CMA is able to get patient on Webex.    The patient was advised to call back or seek an in-person evaluation if the symptoms worsen or if the condition fails to improve as anticipated.  I provided 16 minutes of non-face-to-face time during this encounter.   Penni Homans, MD

## 2018-07-25 NOTE — Telephone Encounter (Signed)
Returned phone call to patient, she states she currently she is using 2 puffs once daily, she states she is having jitters and light headed. She was recently seen in ED for a coughing episode that led to a syncope episode. She states the Qvar has helped with her chest tightness but she is concerned about being light headed. She states her MD called in Hycodan which she opted not taking stating she is sensitive to medications. She brought some robitussin that has helped with the cough. She states with the Qvar her cough has improved. Informed the patient to stop using until she has heard back from [place].   CY please advise. Thanks.

## 2018-07-25 NOTE — Telephone Encounter (Signed)
lmom  For patient

## 2018-07-25 NOTE — Assessment & Plan Note (Signed)
Using albuterol prn and Qvar 2 puffs po bid but feels shaky and and uncomforatabe with this dose. Will try to drop to 1 puff po bid. Let us know if any concerns

## 2018-07-28 ENCOUNTER — Ambulatory Visit: Payer: 59 | Admitting: Family Medicine

## 2018-07-29 ENCOUNTER — Telehealth: Payer: Self-pay | Admitting: Internal Medicine

## 2018-07-29 DIAGNOSIS — K581 Irritable bowel syndrome with constipation: Secondary | ICD-10-CM | POA: Diagnosis not present

## 2018-07-29 DIAGNOSIS — J4520 Mild intermittent asthma, uncomplicated: Secondary | ICD-10-CM

## 2018-07-29 DIAGNOSIS — A048 Other specified bacterial intestinal infections: Secondary | ICD-10-CM | POA: Diagnosis not present

## 2018-07-29 DIAGNOSIS — R110 Nausea: Secondary | ICD-10-CM | POA: Diagnosis not present

## 2018-07-29 NOTE — Telephone Encounter (Signed)
Patient returning phone call.  Patient phone number is [phone].  Can be reached at 2:00 pm and after.

## 2018-07-29 NOTE — Telephone Encounter (Signed)
Attempted to call pt but unable to reach. Left message for pt to return call. 

## 2018-07-29 NOTE — Telephone Encounter (Signed)
Noted. Will hold in triage and call pt after 2pm.

## 2018-07-29 NOTE — Telephone Encounter (Signed)
Left message for patient to call back  

## 2018-08-01 ENCOUNTER — Other Ambulatory Visit: Payer: Self-pay

## 2018-08-01 ENCOUNTER — Telehealth: Payer: Self-pay

## 2018-08-01 ENCOUNTER — Ambulatory Visit (INDEPENDENT_AMBULATORY_CARE_PROVIDER_SITE_OTHER): Payer: 59 | Admitting: Family Medicine

## 2018-08-01 DIAGNOSIS — J4520 Mild intermittent asthma, uncomplicated: Secondary | ICD-10-CM

## 2018-08-01 DIAGNOSIS — J302 Other seasonal allergic rhinitis: Secondary | ICD-10-CM

## 2018-08-01 DIAGNOSIS — R059 Cough, unspecified: Secondary | ICD-10-CM

## 2018-08-01 DIAGNOSIS — J3089 Other allergic rhinitis: Secondary | ICD-10-CM

## 2018-08-01 DIAGNOSIS — R55 Syncope and collapse: Secondary | ICD-10-CM

## 2018-08-01 DIAGNOSIS — R05 Cough: Secondary | ICD-10-CM | POA: Diagnosis not present

## 2018-08-01 NOTE — Telephone Encounter (Signed)
Patient stated that she would like to know if a spacer would be helpful to get the medication in better. She also wanted to know how long she she be doing the Qvar 2 puffs once a day.    Dr. Annamaria Boots please advise

## 2018-08-01 NOTE — Progress Notes (Signed)
Virtual Visit via Video Note  I connected with [name] on 08/01/18 at  2:00 PM EDT by a video enabled telemedicine application and verified that I am speaking with the correct person using two identifiers.   I discussed the limitations of evaluation and management by telemedicine and the availability of in person appointments. The patient expressed understanding and agreed to proceed. [name], CMA was able to get the patient     Subjective:    Patient ID: [name], female    DOB: 1965-08-04, 53 y.o.   MRN: [id number]  No chief complaint on file.   HPI Patient is in today for follow up on cough and syncope. Her cough and congestion are improving with treatment. No fevers or chills. She has had some episodes of feeling like she could pass out especially after bending over. No falls or injury. Denies CP/palp/SOB/HA/fevers/GI or GU c/o. Taking meds as prescribed  Past Medical History:  Diagnosis Date  . Allergic rhinitis   . Anemia   . Anxiety   . Asthma   . Contact dermatitis and eczema   . Diverticulitis    CT Scan  . Esophageal reflux   . H. pylori infection   . Hyperlipidemia, mixed   . IBS (irritable bowel syndrome)   . IC (interstitial cystitis)   . Internal hemorrhoids   . Lumbago   . Mitral valve disorders(424.0)   . Osteoarthritis 07/08/2015  . Post concussive syndrome   . Seasonal and perennial allergic rhinitis 01/20/2015    Past Surgical History:  Procedure Laterality Date  . BARTHOLIN CYST MARSUPIALIZATION N/A 12/18/2013   Procedure: BARTHOLIN CYST MARSUPIALIZATION WITH BIOPSY;  Surgeon: Lovenia Kim, MD;  Location: [facility];  Service: Gynecology;  Laterality: N/A;  . CESAREAN SECTION    . DILITATION & CURRETTAGE/HYSTROSCOPY WITH NOVASURE ABLATION N/A 10/14/2012   Procedure: DILATATION & CURETTAGE/HYSTEROSCOPY WITH NOVASURE ABLATION;  Surgeon: Lovenia Kim, MD;  Location: [facility];  Service: Gynecology;  Laterality: N/A;  . MOUTH SURGERY     . SINUS SURGERY WITH INSTATRAK      Family History  Problem Relation Age of Onset  . Colon polyps Sister   . Mental illness Sister        anxiety from 9/11 survivors  . Varicose Veins Sister   . Diabetes Mother   . Hypertension Mother   . Heart attack Mother        MI at age 30  . Alcohol abuse Mother        smoker  . Heart disease Mother   . Dementia Mother   . Hypertension Father   . Hyperlipidemia Father   . Heart attack Father   . Cancer Father        lung cancer, smoker  . Cancer Brother        acute leukemia  . Dementia Maternal Grandmother   . Heart disease Maternal Grandfather        mi  . Cancer Paternal Grandmother        GYN cancer  . Other Sister        hypoglycemia  . Varicose Veins Sister   . Rheum arthritis Sister   . Pleurisy Sister   . Fibromyalgia Sister   . Mental retardation Sister        depression  . GER disease Son   . Alcohol abuse Other        Family history  . Colon cancer Neg Hx   . Breast cancer  Neg Hx     Social History   Socioeconomic History  . Marital status: Married    Spouse name: Not on file  . Number of children: 2  . Years of education: Masters  . Highest education level: Not on file  Occupational History  . Occupation: [name]  Social Needs  . Financial resource strain: Not on file  . Food insecurity:    Worry: Not on file    Inability: Not on file  . Transportation needs:    Medical: Not on file    Non-medical: Not on file  Tobacco Use  . Smoking status: Never Smoker  . Smokeless tobacco: Never Used  Substance and Sexual Activity  . Alcohol use: Yes    Comment: Holidays only  . Drug use: No  . 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 Physical activity:    Days per week: Not on file    Minutes per session: Not on file  . Stress: Not on file  Relationships  . Social connections:    Talks on  phone: Not on file    Gets together: Not on file    Attends religious service: Not on file    Active member of club or organization: Not on file    Attends meetings of clubs or organizations: Not on file    Relationship status: Not on file  . Intimate partner violence:    Fear of current or ex partner: Not on file    Emotionally abused: Not on file    Physically abused: Not on file    Forced sexual activity: Not on file  Other Topics Concern  . Not on file  Social History Narrative   Lives at home home with husband and son.   Right-handed.   1 cup caffeine daily.    Outpatient Medications Prior to Visit  Medication Sig Dispense Refill  . albuterol (PROVENTIL) (2.5 MG/3ML) 0.083% nebulizer solution Take 3 mLs (2.5 mg total) by nebulization every 6 (six) hours as needed for wheezing or shortness of breath. 75 mL 0  . beclomethasone (QVAR REDIHALER) 80 MCG/ACT inhaler Inhale 2 puffs into the lungs 2 (two) times daily. 1 Inhaler 11  . fluticasone (FLONASE) 50 MCG/ACT nasal spray Place 2 sprays into both nostrils daily. 16 g 1  . HYDROcodone-homatropine (HYCODAN) 5-1.5 MG/5ML syrup Take 5 mLs by mouth every 8 (eight) hours as needed for cough. 120 mL 0  . hydrOXYzine (ATARAX/VISTARIL) 10 MG tablet Take 1/2 or 1 tablet every 8 hours as needed for anxiety 30 tablet 0  . Multiple Vitamins-Minerals (MULTIVITAMIN ADULT PO) Take 1 tablet by mouth daily.    Marland Kitchen OVER THE COUNTER MEDICATION Calcium    . Probiotic Product (PROBIOTIC DAILY PO) Take 1 tablet by mouth daily.     No facility-administered medications prior to visit.     Allergies  Allergen Reactions  . Amoxicillin Shortness Of Breath    Doesn't remember rxn  . Aspirin Shortness Of Breath    "irritated stomach" Ibuprofen and Naproxen do not agree with her either  . Codeine Hives and Shortness Of Breath    Does not take percocet or hydrocodone; tylenol only  . Latex Shortness Of Breath and Itching  . Other Shortness Of Breath     peanuts  . Sulfa Antibiotics Hives and Shortness Of Breath  . Sulfamethoxazole-Trimethoprim Shortness Of Breath  .  Sulfonamide Derivatives Shortness Of Breath and Itching  . Sulphadimidine Sodium [Sulfamethazine Sodium] Shortness Of Breath    Added to food for preservative  . Ciprofloxacin Other (See Comments)    REACTION: SOB, "Tightness in head" REACTION: SOB, "Tightness in head" Syncope   . Azithromycin Other (See Comments)    Faintness  . Carisoprodol   . Clarithromycin     "Doesn't agree with me"  . Epinephrine     Heart racing  . Metronidazole Other (See Comments)    "cannot tolerate"  . Nsaids   . Penicillins     Doesn't remember reaction  . Tolmetin   . Omeprazole Palpitations    Review of Systems  Constitutional: Positive for malaise/fatigue. Negative for fever.  HENT: Positive for congestion.   Eyes: Negative for blurred vision.  Respiratory: Positive for cough and shortness of breath.   Cardiovascular: Negative for chest pain, palpitations and leg swelling.  Gastrointestinal: Negative for abdominal pain, blood in stool and nausea.  Genitourinary: Negative for dysuria and frequency.  Musculoskeletal: Negative for falls.  Skin: Negative for rash.  Neurological: Positive for dizziness. Negative for loss of consciousness and headaches.  Endo/Heme/Allergies: Negative for environmental allergies.  Psychiatric/Behavioral: Negative for depression. The patient is nervous/anxious.        Objective:    Physical Exam Constitutional:      Appearance: Normal appearance.  HENT:     Head: Normocephalic and atraumatic.     Nose: Nose normal.  Neurological:     Mental Status: She is alert and oriented to person, place, and time.  Psychiatric:        Mood and Affect: Mood normal.        Behavior: Behavior normal.     There were no vitals taken for this visit. Wt Readings from Last 3 Encounters:  07/22/18 108 lb 0.4 oz (49 kg)  07/07/18 108 lb 3.2 oz (49.1 kg)   07/04/18 107 lb 9.6 oz (48.8 kg)    Diabetic Foot Exam - Simple   No data filed     Lab Results  Component Value Date   WBC 4.2 07/22/2018   HGB 12.8 07/22/2018   HCT 37.2 07/22/2018   PLT 315 07/22/2018   GLUCOSE 107 (H) 07/22/2018   CHOL 205 (H) 12/28/2017   TRIG 176.0 (H) 12/28/2017   HDL 47.70 12/28/2017   LDLDIRECT 142.7 03/02/2012   LDLCALC 123 (H) 12/28/2017   ALT 10 06/15/2018   AST 15 06/15/2018   NA 135 07/22/2018   K 4.0 07/22/2018   CL 104 07/22/2018   CREATININE 0.51 07/22/2018   BUN 13 07/22/2018   CO2 25 07/22/2018   TSH 1.80 12/28/2017   HGBA1C 5.4 01/17/2018    Lab Results  Component Value Date   TSH 1.80 12/28/2017   Lab Results  Component Value Date   WBC 4.2 07/22/2018   HGB 12.8 07/22/2018   HCT 37.2 07/22/2018   MCV 97.1 07/22/2018   PLT 315 07/22/2018   Lab Results  Component Value Date   NA 135 07/22/2018   K 4.0 07/22/2018   CO2 25 07/22/2018   GLUCOSE 107 (H) 07/22/2018   BUN 13 07/22/2018   CREATININE 0.51 07/22/2018   BILITOT 0.5 06/15/2018   ALKPHOS 81 06/15/2018   AST 15 06/15/2018   ALT 10 06/15/2018   PROT 6.6 06/15/2018   ALBUMIN 4.2 06/15/2018   CALCIUM 9.0 07/22/2018   ANIONGAP 6 07/22/2018   GFR 84.71 12/28/2017   Lab Results  Component Value Date   CHOL 205 (H) 12/28/2017   Lab Results  Component Value Date   HDL 47.70 12/28/2017   Lab Results  Component Value Date   LDLCALC 123 (H) 12/28/2017   Lab Results  Component Value Date   TRIG 176.0 (H) 12/28/2017   Lab Results  Component Value Date   CHOLHDL 4 12/28/2017   Lab Results  Component Value Date   HGBA1C 5.4 01/17/2018       Assessment & Plan:   Problem List Items Addressed This Visit    Allergic asthma, mild intermittent, uncomplicated    Breathing is improving some with Qvar but did feels shakey on 2 puffs twice a day so she dropped to 2 puffs once daily and that has helped. She is encouraged to change to 1 puff twice daily x 2 weeks  then drop to 1 puff daily and see how she does. Increase dose back up if symptoms flare when she decreases      Seasonal and perennial allergic rhinitis    Slowly improving continue current meds for now. In about 2 weeks she can try weaning off of medications. If symptoms return will have to stay on meds      Syncope    She has had several episodes of feeling as if she was going to pass out especially after bending over and she acknowledges she does not hydrate well and often forgets to eat as well. She has experienced syncope. Encouraged to drink 60-80 ounces of fluids daily and to minimize caffeine intake. Eat small, frequent meals with lean proteins and healthy fats, notify us if no improvement. FMLA paperwork is filled out for work if these episodes persist.      Cough    Is improving but still has symptoms daily no change in treatment unless patient's symptoms worsen.          I am having [name] maintain her Probiotic Product (PROBIOTIC DAILY PO), OVER THE COUNTER MEDICATION, Multiple Vitamins-Minerals (MULTIVITAMIN ADULT PO), fluticasone, hydrOXYzine, albuterol, beclomethasone, and HYDROcodone-homatropine.  No orders of the defined types were placed in this encounter.    Penni Homans, MD      I discussed the assessment and treatment plan with the patient. The patient was provided an opportunity to ask questions and all were answered. The patient agreed with the plan and demonstrated an understanding of the instructions.   The patient was advised to call back or seek an in-person evaluation if the symptoms worsen or if the condition fails to improve as anticipated.  I provided 22 minutes of non-face-to-face time during this encounter.   Penni Homans, MD

## 2018-08-01 NOTE — Assessment & Plan Note (Signed)
She has had several episodes of feeling as if she was going to pass out especially after bending over and she acknowledges she does not hydrate well and often forgets to eat as well. She has experienced syncope. Encouraged to drink 60-80 ounces of fluids daily and to minimize caffeine intake. Eat small, frequent meals with lean proteins and healthy fats, notify us if no improvement. FMLA paperwork is filled out for work if these episodes persist.

## 2018-08-01 NOTE — Assessment & Plan Note (Signed)
Slowly improving continue current meds for now. In about 2 weeks she can try weaning off of medications. If symptoms return will have to stay on meds

## 2018-08-01 NOTE — Assessment & Plan Note (Signed)
Breathing is improving some with Qvar but did feels shakey on 2 puffs twice a day so she dropped to 2 puffs once daily and that has helped. She is encouraged to change to 1 puff twice daily x 2 weeks then drop to 1 puff daily and see how she does. Increase dose back up if symptoms flare when she decreases

## 2018-08-01 NOTE — Assessment & Plan Note (Signed)
Is improving but still has symptoms daily no change in treatment unless patient's symptoms worsen.

## 2018-08-01 NOTE — Telephone Encounter (Signed)
A) Yes, an aerochamber spacer would help get medication from a metered inhaler like Qvar into the chest a little better, and with less chance of getting yeast(thrush). Do still need to rinse mouth after use though. Ok to order her an aerochamber  B) A maintenance controller med like Qvar can be used long-term. There may be times of year when it is not needed.

## 2018-08-01 NOTE — Telephone Encounter (Signed)
Patient calling regarding FMLA paperwork. Advised per CMA papers will be faxed by days end. Patient agreed.

## 2018-08-01 NOTE — Telephone Encounter (Signed)
Called spoke with patient and discussed CDY's recommendations as stated below.  Patient is requesting the spacer be sent to [facility] in [place] as this is convenient to patient's home.  Also discussed CDY's recommendations about her QVAR use.  Patient stated that she will try to decrease her QVAR and monitor her symptoms to see if she can tolerate a lower dose.  Patient is aware to contact the office if needed.  Nothing further needed; will sign off.

## 2018-08-08 NOTE — Telephone Encounter (Signed)
How much time/days per week per fainting episode does the Pt needs off of work.  FMLA faxed paperwork back to complete this portion. /please advise / call Pt when its been done

## 2018-08-08 NOTE — Telephone Encounter (Signed)
Needs 4-8 hours weekly prn fainting episodes

## 2018-08-09 NOTE — Telephone Encounter (Signed)
Located paperwork wrote in the request and faxed back to company

## 2018-08-10 ENCOUNTER — Telehealth: Payer: Self-pay

## 2018-08-10 NOTE — Telephone Encounter (Signed)
Patient last seen on 08/01/18. States she still has cough but she is feeling better. Patient states she needed note from her Dr. If ok for her to go back to work.

## 2018-08-10 NOTE — Telephone Encounter (Signed)
Copied from [place] [id number]. Topic: General - Other >> Aug 10, 2018 10:41 AM Percell Belt A wrote: Reason for CRM: Pt called in and stated that she still has the cough.  She said that she is feeling better but she still has the cough.  No fever,her employer told her that she needs to confirm with her DR that she could go back to work ?    Please advise  Best number [phone]

## 2018-08-10 NOTE — Telephone Encounter (Signed)
As long as she is improving and has not had a fever in past 72 hours she can have a letter that simply says patient is cleared to return to work. Please mail this to  Her.

## 2018-08-11 ENCOUNTER — Encounter: Payer: Self-pay | Admitting: Family Medicine

## 2018-08-11 NOTE — Telephone Encounter (Signed)
Return to work letter mailed to patient per Dr. Frederik Pear request.

## 2018-08-12 DIAGNOSIS — Z0279 Encounter for issue of other medical certificate: Secondary | ICD-10-CM

## 2018-08-17 ENCOUNTER — Telehealth: Payer: Self-pay | Admitting: Internal Medicine

## 2018-08-17 MED ORDER — ALBUTEROL SULFATE HFA 108 (90 BASE) MCG/ACT IN AERS: 2.0000 | INHALATION_SPRAY | Freq: Four times a day (QID) | RESPIRATORY_TRACT | 6 refills | Status: DC | PRN

## 2018-08-17 NOTE — Telephone Encounter (Signed)
Called pt's pharmacy and spoke with Helyn App letting her know it would be fine to switch the ventolin inhaler to proventil. Helyn App expressed understanding. Nothing further needed.

## 2018-08-17 NOTE — Telephone Encounter (Signed)
Albuterol inhaler sent to pt's preferred pharmacy. Called and spoke with pt letting her know this had been done. Pt expressed understanding. Nothing further needed.

## 2018-08-19 ENCOUNTER — Telehealth: Payer: Self-pay | Admitting: Medical

## 2018-08-19 ENCOUNTER — Encounter: Payer: Self-pay | Admitting: Medical

## 2018-08-19 ENCOUNTER — Ambulatory Visit: Payer: Self-pay | Admitting: Family Medicine

## 2018-08-19 ENCOUNTER — Other Ambulatory Visit: Payer: Self-pay

## 2018-08-19 ENCOUNTER — Ambulatory Visit (INDEPENDENT_AMBULATORY_CARE_PROVIDER_SITE_OTHER): Payer: 59 | Admitting: Medical

## 2018-08-19 VITALS — BP 126/76 | HR 91 | Temp 98.4°F

## 2018-08-19 DIAGNOSIS — J302 Other seasonal allergic rhinitis: Secondary | ICD-10-CM

## 2018-08-19 DIAGNOSIS — R05 Cough: Secondary | ICD-10-CM | POA: Diagnosis not present

## 2018-08-19 DIAGNOSIS — J4520 Mild intermittent asthma, uncomplicated: Secondary | ICD-10-CM | POA: Diagnosis not present

## 2018-08-19 DIAGNOSIS — J3089 Other allergic rhinitis: Secondary | ICD-10-CM

## 2018-08-19 DIAGNOSIS — R059 Cough, unspecified: Secondary | ICD-10-CM

## 2018-08-19 MED ORDER — METHYLPREDNISOLONE 4 MG PO TABS: ORAL_TABLET | ORAL | 0 refills | Status: DC

## 2018-08-19 NOTE — Telephone Encounter (Signed)
Pt. Reports she works for [employer] and was sent home because a co-worker tested positive for COVID 19. She has asthma and is currently having dry cough and chest heaviness. Warm transfer to [facility] in the practice for a virtual visit with her PCP. Answer Assessment - Initial Assessment Questions 1. CLOSE CONTACT: "Who is the person with the confirmed or suspected COVID-19 infection that you were exposed to?"     Someone at work 2. PLACE of CONTACT: "Where were you when you were exposed to COVID-19?" (e.g., home, school, medical waiting room; which city?)     Work 3. TYPE of CONTACT: "How much contact was there?" (e.g., sitting next to, live in same house, work in same office, same building)     Same building 4. DURATION of CONTACT: "How long were you in contact with the COVID-19 patient?" (e.g., a few seconds, passed by person, a few minutes, live with the patient)     Work 5. DATE of CONTACT: "When did you have contact with a COVID-19 patient?" (e.g., how many days ago)     This week 6. TRAVEL: "Have you traveled out of the country recently?" If so, "When and where?"     * Also ask about out-of-state travel, since the CDC has identified some high risk cities for community spread in the [place].     * Note: Travel becomes less relevant if there is widespread community transmission where the patient lives.     No 7. COMMUNITY SPREAD: "Are there lots of cases or COVID-19 (community spread) where you live?" (See public health department website, if unsure)   * MAJOR community spread: high number of cases; numbers of cases are increasing; many people hospitalized.   * MINOR community spread: low number of cases; not increasing; few or no people hospitalized     Minor 8. SYMPTOMS: "Do you have any symptoms?" (e.g., fever, cough, breathing difficulty)     Dry cough and chest heaviness 9. PREGNANCY OR POSTPARTUM: "Is there any chance you are pregnant?" "When was your last menstrual  period?" "Did you deliver in the last 2 weeks?"     No 10. HIGH RISK: "Do you have any heart or lung problems? Do you have a weak immune system?" (e.g., CHF, COPD, asthma, HIV positive, chemotherapy, renal failure, diabetes mellitus, sickle cell anemia)       Asthma  Protocols used: CORONAVIRUS (COVID-19) EXPOSURE-A-AH

## 2018-08-19 NOTE — Telephone Encounter (Signed)
Patient was scheduled with Percell Miller for virtual visit

## 2018-08-19 NOTE — Telephone Encounter (Signed)
I printed a work note for pt on Friday. Will you mail that to her.  Should be on the printer. If not will you reprint it and I will sign.

## 2018-08-19 NOTE — Patient Instructions (Addendum)
Your symptoms recently seem to be more in line with allergic rhinitis and asthma flare.  Symptoms appear to have occurred as you were tapering down/off of the Qvar.  Symptoms also seem to resolve as a use the albuterol neb treatment.  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  I am happy to hear that she did not have fever.  Would continue to check your temperature daily and not take any antipyretics.  Particularly no Tylenol or ibuprofen.  Over the weekend if you do have a fever over 100 along with persisting or worsening signs symptoms as you described then would go to [facility] long emergency department and explain potential recent exposure and your symptoms.Expect they would do covid testing.   I am sending in low dose taper medrol dose pack. Start back on qvar one inh twice daily and use albuterol if needed. Use benzonatate for cough.  Asked pt to update me on how she is doing by Monday afternoon or Tuesday morning. Sooner if needed  Printed work note for pt. She does not use my chart. So asking MA to mail her work note on Monday.

## 2018-08-19 NOTE — Progress Notes (Signed)
Subjective:    Patient ID: [name], female    DOB: [birth year], 53 y.o.   MRN: [id number]  HPI   Virtual Visit via Video (NoteVirtual Visit via Telephone Note(due to failure to connect to [email or web address] platform. Saw patient but audio failure. So had to call pt.) Various attempts to connect  But audio did not work. But did video did work.  I connected with [name] on 08/19/18 at  2:20 PM EDT by a video enabled telemedicine application and verified that I am speaking with the correct person using two identifiers.   I discussed the limitations of evaluation and management by telemedicine and the availability of in person appointments. The patient expressed understanding and agreed to proceed.  History of Present Illness:  Pt in with some recent cough, mild slight st but no fever. In fact temp was low. Pt mild sob on exertion.Pt has some described upper area pain below neck some slight tightness. Not reporting cardiac type signs or symptoms.  Pt states she used albuterol nebulizer solution neb treatment today and it opened her up quickly.   She states if laughs or talk a lot feels winded.  Pt saw pulmonologist and he told her to take 2 puffs twice a day. But she thought made her dizzy. She was told to take 1 inh twice a day after she informed her pulmonologist it made her dizzy. She tapered down to just once a day. Pt states this Monday she finally tapered off of qvar.   Pt states someone in building tested positive for covid 19. Pt does not know who this person is.   Pt did pick up pocket inhaler.  Pt had symptoms for 2 weeks ago.  She also describes severe cough episode 2 week ago that caused her to pass out. ED evaluated her and told her vasovagal syncope    Observations/Objective: No acute distress on inspection before virtual video failure.  Assessment and Plan: Your symptoms recently seem to be more in line with allergic rhinitis and asthma flare.  Symptoms appear to  have occurred as you were tapering down/off of the Qvar.  Symptoms also seem to resolve as a use the albuterol neb treatment.  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  I am happy to hear that she did not have fever.  Would continue to check your temperature daily and not take any antipyretics.  Particularly no Tylenol or ibuprofen.  Over the weekend if you do have a fever over 100 along with persisting or worsening signs symptoms as you described then would go to [facility] emergency department and explain potential recent exposure and your symptoms.Expect they would do covid testing.   I am sending in low dose taper medrol dose pack. Start back on qvar one inh twice daily and use albuterol if needed. Use benzonatate for cough.  Asked pt to update me on how she is doing by Monday afternoon or Tuesday morning. Sooner if needed  Printed work note for pt. She does not use my chart. So asking MA to mail her work note on Monday.  Follow Up Instructions:    I discussed the assessment and treatment plan with the patient. The patient was provided an opportunity to ask questions and all were answered. The patient agreed with the plan and demonstrated an understanding of the instructions.  The patient was advised to call back or seek an in-person evaluation if the symptoms worsen or if the condition fails to improve as anticipated.     Mackie Pai, PA-C           Observations/Objective: No acute distress on inspection before virtual video failure.   Assessment and Plan: Your symptoms recently seem to be more in line with allergic rhinitis and asthma flare.  Symptoms appear to have occurred as you were tapering down/off of the Qvar.  Symptoms also seem to resolve as a use the albuterol neb treatment.  However you do have some degree of exposure to potential COVID as 1  of the many employees in your office was diagnosed yet you do not know exactly who that was.  I am happy to hear that she did not have fever.  Would continue to check your temperature daily and not take any antipyretics.  Particularly no Tylenol or ibuprofen.  Over the weekend if you do have a fever over 100 along with persisting or worsening signs symptoms as you described then would go to [facility] emergency department and explain potential recent exposure and your symptoms.Expect they would do covid testing.   I am sending in low dose taper medrol dose pack. Start back on qvar one inh twice daily and use albuterol if needed. Use benzonatate for cough.  Asked pt to update me on how she is doing by Monday afternoon or Tuesday morning. Sooner if needed  Follow Up Instructions:    I discussed the assessment and treatment plan with the patient. The patient was provided an opportunity to ask questions and all were answered. The patient agreed with the plan and demonstrated an understanding of the instructions.   The patient was advised to call back or seek an in-person evaluation if the symptoms worsen or if the condition fails to improve as anticipated.     Mackie Pai, PA-C   Review of Systems  Constitutional: Negative for chills, fatigue and fever.  HENT: Negative for congestion, postnasal drip, sinus pressure and sinus pain.   Respiratory: Positive for cough, chest tightness and shortness of breath. Negative for apnea.        Chest tightness and sob relieved quickly with nebulizer.  Cardiovascular: Negative for chest pain and palpitations.  Gastrointestinal: Negative for abdominal pain.  Musculoskeletal: Negative for back pain.  Neurological: Negative for dizziness, seizures, weakness and headaches.  Hematological: Negative for adenopathy. Does not bruise/bleed easily.  Psychiatric/Behavioral: Negative for behavioral problems and confusion.   Past Medical History:  Diagnosis  Date  . Allergic rhinitis   . Anemia   . Anxiety   . Asthma   . Contact dermatitis and eczema   . Diverticulitis    CT Scan  . Esophageal reflux   . H. pylori infection   . Hyperlipidemia, mixed   . IBS (irritable bowel syndrome)   . IC (interstitial cystitis)   . Internal hemorrhoids   . Lumbago   . Mitral valve disorders(424.0)   . Osteoarthritis 07/08/2015  . Post concussive syndrome   . Seasonal and perennial allergic rhinitis 01/20/2015     Social History   Socioeconomic History  . Marital status: Married    Spouse name: Not on file  . Number of children: 2  . Years of education: Masters  . Highest education level: Not on file  Occupational History  . Occupation: [employer]  Social Needs  . Financial resource strain: Not on file  .  Food insecurity:    Worry: Not on file    Inability: Not on file  . Transportation needs:    Medical: Not on file    Non-medical: Not on file  Tobacco Use  . Smoking status: Never Smoker  . Smokeless tobacco: Never Used  Substance and Sexual Activity  . Alcohol use: Yes    Comment: Holidays only  . Drug use: No  . 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 Physical activity:    Days per week: Not on file    Minutes per session: Not on file  . Stress: Not on file  Relationships  . Social connections:    Talks on phone: Not on file    Gets together: Not on file    Attends religious service: Not on file    Active member of club or organization: Not on file    Attends meetings of clubs or organizations: Not on file    Relationship status: Not on file  . Intimate partner violence:    Fear of current or ex partner: Not on file    Emotionally abused: Not on file    Physically abused: Not on file    Forced sexual activity: Not on file  Other Topics Concern  . Not on file  Social History Narrative   Lives at home home with  husband and son.   Right-handed.   1 cup caffeine daily.    Past Surgical History:  Procedure Laterality Date  . BARTHOLIN CYST MARSUPIALIZATION N/A 12/18/2013   Procedure: BARTHOLIN CYST MARSUPIALIZATION WITH BIOPSY;  Surgeon: Lovenia Kim, MD;  Location: [facility];  Service: Gynecology;  Laterality: N/A;  . CESAREAN SECTION    . DILITATION & CURRETTAGE/HYSTROSCOPY WITH NOVASURE ABLATION N/A 10/14/2012   Procedure: DILATATION & CURETTAGE/HYSTEROSCOPY WITH NOVASURE ABLATION;  Surgeon: Lovenia Kim, MD;  Location: [facility];  Service: Gynecology;  Laterality: N/A;  . MOUTH SURGERY    . SINUS SURGERY WITH INSTATRAK      Family History  Problem Relation Age of Onset  . Colon polyps Sister   . Mental illness Sister        anxiety from 9/11 survivors  . Varicose Veins Sister   . Diabetes Mother   . Hypertension Mother   . Heart attack Mother        MI at age 66  . Alcohol abuse Mother        smoker  . Heart disease Mother   . Dementia Mother   . Hypertension Father   . Hyperlipidemia Father   . Heart attack Father   . Cancer Father        lung cancer, smoker  . Cancer Brother        acute leukemia  . Dementia Maternal Grandmother   . Heart disease Maternal Grandfather        mi  . Cancer Paternal Grandmother        GYN cancer  . Other Sister        hypoglycemia  . Varicose Veins Sister   . Rheum arthritis Sister   . Pleurisy Sister   . Fibromyalgia Sister   . Mental retardation Sister        depression  . GER disease Son   . Alcohol abuse Other        Family history  . Colon cancer Neg Hx   .  Breast cancer Neg Hx     Allergies  Allergen Reactions  . Amoxicillin Shortness Of Breath    Doesn't remember rxn  . Aspirin Shortness Of Breath    "irritated stomach" Ibuprofen and Naproxen do not agree with her either  . Codeine Hives and Shortness Of Breath    Does not take percocet or hydrocodone; tylenol only  . Latex Shortness Of Breath and Itching  . Other  Shortness Of Breath    peanuts  . Sulfa Antibiotics Hives and Shortness Of Breath  . Sulfamethoxazole-Trimethoprim Shortness Of Breath  . Sulfonamide Derivatives Shortness Of Breath and Itching  . Sulphadimidine Sodium [Sulfamethazine Sodium] Shortness Of Breath    Added to food for preservative  . Ciprofloxacin Other (See Comments)    REACTION: SOB, "Tightness in head" REACTION: SOB, "Tightness in head" Syncope   . Azithromycin Other (See Comments)    Faintness  . Carisoprodol   . Clarithromycin     "Doesn't agree with me"  . Epinephrine     Heart racing  . Metronidazole Other (See Comments)    "cannot tolerate"  . Nsaids   . Penicillins     Doesn't remember reaction  . Tolmetin   . Omeprazole Palpitations    Current Outpatient Medications on File Prior to Visit  Medication Sig Dispense Refill  . albuterol (PROVENTIL) (2.5 MG/3ML) 0.083% nebulizer solution Take 3 mLs (2.5 mg total) by nebulization every 6 (six) hours as needed for wheezing or shortness of breath. 75 mL 0  . albuterol (VENTOLIN HFA) 108 (90 Base) MCG/ACT inhaler Inhale 2 puffs into the lungs every 6 (six) hours as needed for wheezing or shortness of breath. 1 Inhaler 6  . beclomethasone (QVAR REDIHALER) 80 MCG/ACT inhaler Inhale 2 puffs into the lungs 2 (two) times daily. 1 Inhaler 11  . fluticasone (FLONASE) 50 MCG/ACT nasal spray Place 2 sprays into both nostrils daily. 16 g 1  . HYDROcodone-homatropine (HYCODAN) 5-1.5 MG/5ML syrup Take 5 mLs by mouth every 8 (eight) hours as needed for cough. 120 mL 0  . hydrOXYzine (ATARAX/VISTARIL) 10 MG tablet Take 1/2 or 1 tablet every 8 hours as needed for anxiety 30 tablet 0  . Multiple Vitamins-Minerals (MULTIVITAMIN ADULT PO) Take 1 tablet by mouth daily.    Marland Kitchen OVER THE COUNTER MEDICATION Calcium    . Probiotic Product (PROBIOTIC DAILY PO) Take 1 tablet by mouth daily.     No current facility-administered medications on file prior to visit.     BP 126/76   Pulse  91   Temp 98.4 F (36.9 C) Comment: at her home.      Objective:   Physical Exam        Assessment & Plan:

## 2018-08-22 NOTE — Telephone Encounter (Signed)
Stamped letter and placed in mail bin.

## 2018-08-23 ENCOUNTER — Telehealth: Payer: Self-pay | Admitting: Family Medicine

## 2018-08-23 NOTE — Telephone Encounter (Signed)
Spoke w/ Pt- informed of [name] recommendations. Pt verbalized understanding, virtual visit scheduled for 08/26/2018.

## 2018-08-23 NOTE — Telephone Encounter (Signed)
Copied from [place] [id number]. Topic: General - Other >> Aug 23, 2018  2:41 PM Keene Breath wrote: Reason for CRM: Patient called to ask Dr. Harvie Heck if he thought she should go on Short term disability considering her high risk for [place].  Patient is not sure if she should apply and would need the paperwork filled out by the doctor.  Please advise and call patient to let her know.  CB# [phone]

## 2018-08-23 NOTE — Telephone Encounter (Signed)
Please advise 

## 2018-08-23 NOTE — Telephone Encounter (Signed)
Copied from [place] [id number]. Topic: Quick Communication - See Telephone Encounter >> Aug 23, 2018 10:44 AM Ahmed Prima L wrote: CRM for notification. See Telephone encounter for: 08/23/18.  Patient would like to know if Percell Miller wants to do a virtual visit with her today so that she can go back to work tomorrow or wait until the end of the week? She did visit with him on 4/24. She has no fever and her blood pressure has been good. She will take her last dose of prednisone tomorrow. Please Advise.

## 2018-08-23 NOTE — Telephone Encounter (Signed)
It would be better to have her schedule for Friday virtual visit. She had possible covid exposure and employer sent everyone home late last week. She does not know who that person was. If she had close exposure. I gave her 7 days work note per policy. So better to do visit on Friday and see if she can return on Monday.

## 2018-08-24 ENCOUNTER — Telehealth: Payer: Self-pay

## 2018-08-24 ENCOUNTER — Telehealth: Payer: Self-pay | Admitting: Medical

## 2018-08-24 DIAGNOSIS — R05 Cough: Secondary | ICD-10-CM

## 2018-08-24 DIAGNOSIS — Z20822 Contact with and (suspected) exposure to covid-19: Secondary | ICD-10-CM

## 2018-08-24 DIAGNOSIS — R0600 Dyspnea, unspecified: Secondary | ICD-10-CM

## 2018-08-24 DIAGNOSIS — Z20828 Contact with and (suspected) exposure to other viral communicable diseases: Secondary | ICD-10-CM

## 2018-08-24 DIAGNOSIS — R059 Cough, unspecified: Secondary | ICD-10-CM

## 2018-08-24 NOTE — Telephone Encounter (Signed)
Would you call [facility] And see how we can get covid order to them for patient. Also ask what turn around time for test is. Days or hours. Pt has cough and sob. Exposure to someone at work with covid probable. I want to know how quick turn around time. As might send her to [facility].

## 2018-08-24 NOTE — Telephone Encounter (Signed)
Thanks we will send info to patient and I think [facility] would be better option in light of best case scenario 2-3 days.

## 2018-08-24 NOTE — Telephone Encounter (Signed)
Will you call patient and get number to [facility] she is actually going to. I want to call that lab and get idea of turn around time. I don't want her to have to wait a long time. Some patients are waiting days for results. In addition if short of breath it would be beneficial for her to have 02 sat checked. I think likley better option is [facility]. I think there test turn around is faster and she can get physical exam, chest xray and o2 sat check. Maybe labs.  I really do this [facility] ED is best option.

## 2018-08-24 NOTE — Telephone Encounter (Signed)
I put in order for covid testing.call lab and ask see what is expected wait time presently

## 2018-08-24 NOTE — Telephone Encounter (Signed)
Can discuss on Friday after seeing how she is. Would ask he to contact her to get there opinion. Techinically her age and lung history gives her 1 point risk score complication. But some employers are being flexible on abscences. But other maybe not. So want to discuss. Then will discuss with her pcp Dr. Charlett Blake.

## 2018-08-24 NOTE — Telephone Encounter (Signed)
Pt states she does not want to go to ED and SOB is getting better. Pt states she is going to [place] to get labs done.

## 2018-08-24 NOTE — Addendum Note (Signed)
Addended by: Anabel Halon on: 08/24/2018 03:19 PM   Modules accepted: Orders

## 2018-08-24 NOTE — Telephone Encounter (Addendum)
Patient states she would like for E. Saguier PA-C to refer her to [facility] for COVID-19 test.  Patient states she has has SOB and cough that has not gone away. 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 Patient states she has appointment there today at 3:00pm and needs order in before then.

## 2018-08-24 NOTE — Telephone Encounter (Signed)
[name] -- [facility] turnaround time says 2-3 days BUT it says this may be impacted by high demand. Previously it was taking [place] anywhere from 1 to 2 weeks to get the result back with the average being about 10 days when we were doing in office testing.

## 2018-08-25 ENCOUNTER — Telehealth: Payer: Self-pay | Admitting: Medical

## 2018-08-25 LAB — SAR COV2 SEROLOGY (COVID19)AB(IGG),IA: SARS CoV2 AB IGG: NEGATIVE

## 2018-08-25 NOTE — Telephone Encounter (Signed)
[name] -- I spoke with Chrystal @ [facility]. She states they do not currently have an IgM test for Covid-19. Their current turnaround time for the nasopharyngeal swab is 3 days. She states she has been seeing very few delays beyond 3 days but it may go past 3 days if they have to do repeat / confirmation testing.

## 2018-08-25 NOTE — Telephone Encounter (Signed)
See additional phone note from 08/25/18.

## 2018-08-25 NOTE — Telephone Encounter (Signed)
I resulted pt lab work on covid. Pt has appointment on 4-31-2020. On results note I stated she has apppointment next week. I was mistaken. You don't have to call her unless she misses virtual visit.

## 2018-08-25 NOTE — Telephone Encounter (Signed)
Will you call [facility]. I put order in since pt arranged by herself to go to [facility]. I recommended ED evaluation. So order in epic vague. Antibody was resulted. I think no antibody test 100%. Some false negatives. I thought they were going to nasal swab? This is more accurate for acute infection.   So can you get order for nasal swab.  Can they add igm to lab that was drawn. Since they did antibody test should include igm.

## 2018-08-26 ENCOUNTER — Telehealth: Payer: Self-pay

## 2018-08-26 ENCOUNTER — Other Ambulatory Visit: Payer: Self-pay

## 2018-08-26 ENCOUNTER — Ambulatory Visit (INDEPENDENT_AMBULATORY_CARE_PROVIDER_SITE_OTHER): Payer: 59 | Admitting: Medical

## 2018-08-26 ENCOUNTER — Encounter: Payer: Self-pay | Admitting: Medical

## 2018-08-26 DIAGNOSIS — J301 Allergic rhinitis due to pollen: Secondary | ICD-10-CM

## 2018-08-26 DIAGNOSIS — J4520 Mild intermittent asthma, uncomplicated: Secondary | ICD-10-CM

## 2018-08-26 DIAGNOSIS — R059 Cough, unspecified: Secondary | ICD-10-CM

## 2018-08-26 DIAGNOSIS — R05 Cough: Secondary | ICD-10-CM | POA: Diagnosis not present

## 2018-08-26 MED ORDER — FLUCONAZOLE 150 MG PO TABS: 150.0000 mg | ORAL_TABLET | Freq: Once | ORAL | 0 refills | Status: AC

## 2018-08-26 NOTE — Telephone Encounter (Signed)
New letter has been mailed to employer, patient and mailed to patient for her copy as well.

## 2018-08-26 NOTE — Progress Notes (Signed)
   Subjective:    Patient ID: [name], female    DOB: 06/16/1965, 53 y.o.   MRN: [id number]  HPI  Virtual Visit via Video Note  I connected with [name] on 08/26/18 at 11:00 AM EDT by a video enabled telemedicine application and verified that I am speaking with the correct person using two identifiers.  Location: Patient: Mallory near her house. Provider: office.   I discussed the limitations of evaluation and management by telemedicine and the availability of in person appointments. The patient expressed understanding and agreed to proceed.  History of Present Illness:  Pt states that she feels a lot better. Breathing a lot better. No longer has lung tightness. Not hearing herself wheeze. No fever since last evaluated her, no chills or sweats. Pt nasal congestion has cleared.   Pt has tapered prednisone. Pt used benzonatate, used qvar as well. Never used albuterol.  Only rare intermittent cough at this point.   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  Lungs- on inspection no labored breathing. Neuro- on inspection. No gross motor/sensory function intact.    Assessment and Plan: Your symptoms have improved with treatment. Thus appears that you had allergy and asthma flare. Your cough is better and residual at this point. Continue with qvar inh twice daily.  Check T daily over next week. If any over 100 let me know. I talked with your pcp and she feels you can go back to work/meet criteria. Remember to keep mask on.  You wanted covid test early this week and I advise [name] and you went [facility]. They did igg antibody. Igm not available. I called [facility] and in hour building an they only do igg testing.  Follow up as regularly scheuled. But also give me my chart update in one week or sooner.  Follow Up Instructions:    I discussed the assessment and treatment plan with the patient. The patient was provided  an opportunity to ask questions and all were answered. The patient agreed with the plan and demonstrated an understanding of the instructions.   The patient was advised to call back or seek an in-person evaluation if the symptoms worsen or if the condition fails to improve as anticipated.  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 Sent to [place].  40 minutes spent with pt. Virtual visit and later phone call as well. 50% of time spent discussing her return to work and critieria for covid testing and well as testing in general. Answered all pt questions.     Mackie Pai, PA-C    Review of Systems     Objective:   Physical Exam        Assessment & Plan:

## 2018-08-26 NOTE — Telephone Encounter (Signed)
Copied from [place] [id number]. Topic: General - Other >> Aug 26, 2018  9:44 AM Yvette Rack wrote: Reason for CRM: Pt stated her employer is requesting more specific information regarding her return back to work on 08/29/18. Pt asked that the doctor note be prepared and e-mailed to her. E-mail address was verified.

## 2018-08-26 NOTE — Patient Instructions (Addendum)
Your symptoms have improved with treatment. Thus appears that you had allergy and asthma flare. Your cough is better and residual at this point. Continue with qvar inh twice daily.  Check T daily over next week. If any over 100 let me know. I talked with your pcp and she feels you can go back to work/meet criteria. Remember to keep mask on.  You wanted covid test early this week and I advise [facility] and you went to [facility]. They did igg antibody. Igm not available. I called [facility] and in hour building an they only do igg testing.  Follow up as regularly scheuled. But also give me my chart update in one week or sooner.

## 2018-08-28 ENCOUNTER — Telehealth: Payer: Self-pay | Admitting: Medical

## 2018-08-28 NOTE — Telephone Encounter (Signed)
Will you call patient and see how she is. Has she had any fever since Friday? She is returning to work after 7 day evaluation post potential exposure to covid. Just want to make sure no fever. If she does have fever then let me know as she would need testing. Pt early on during last week did not want to go to [facility] ED. If she does have fever I think I have found a place that will evaluate her and test her from her car. Let me know what she says.  If no fever I don't think needs testing at this point but wanted to check her status since returning to work  Also you could offer virtual visit with Dr Charlett Blake her pcp as well if she has a lot of questions or concerns. Dr. Charlett Blake is her pcp.

## 2018-08-29 ENCOUNTER — Telehealth: Payer: Self-pay | Admitting: Medical

## 2018-08-29 ENCOUNTER — Telehealth: Payer: Self-pay | Admitting: Family Medicine

## 2018-08-29 MED ORDER — MONTELUKAST SODIUM 5 MG PO CHEW: 5.0000 mg | CHEWABLE_TABLET | Freq: Every day | ORAL | 3 refills | Status: DC

## 2018-08-29 MED ORDER — FLUCONAZOLE 150 MG PO TABS: 150.0000 mg | ORAL_TABLET | Freq: Once | ORAL | 0 refills | Status: AC

## 2018-08-29 NOTE — Telephone Encounter (Signed)
Please call and confirm no fever or new symptoms over the weekend and then she can return to work. Also ok to schedule visit if need be

## 2018-08-29 NOTE — Telephone Encounter (Signed)
Rx diflucan sent to [facility]

## 2018-08-29 NOTE — Telephone Encounter (Signed)
Diflucan sent to pt pharmacy and low dose montelukast sent as well per pt request.

## 2018-08-29 NOTE — Telephone Encounter (Signed)
Copied from [place] [id number]. Topic: Quick Communication - See Telephone Encounter >> Aug 29, 2018  1:22 PM Vernona Rieger wrote: CRM for notification. See Telephone encounter for: 08/29/18.  Patient said she thought that Percell Miller was going to call her in [place]. She would like that sent to [facility] [address] - [place], [place] - [address] [place] [address] Phone: [phone] Fax: [phone]  She also states she needs a refill on Singular ( a small dose , like a child's dose )

## 2018-08-30 MED ORDER — FLUCONAZOLE 150 MG PO TABS: 150.0000 mg | ORAL_TABLET | Freq: Once | ORAL | 0 refills | Status: AC

## 2018-08-30 NOTE — Telephone Encounter (Signed)
Patient went back to work on Monday and she states she is doing well only with a dry cough.  Patient states she wants to know should she take a low dose Singulair  Please advise

## 2018-08-30 NOTE — Addendum Note (Signed)
Addended by: [name] A on: 08/30/2018 02:28 PM   Modules accepted: Orders

## 2018-08-30 NOTE — Telephone Encounter (Signed)
I sent that in at her request. If cough associates with allergies then I do think will help.

## 2018-09-08 ENCOUNTER — Telehealth: Payer: Self-pay | Admitting: Internal Medicine

## 2018-09-08 NOTE — Telephone Encounter (Signed)
Pt is calling back [phone]

## 2018-09-08 NOTE — Telephone Encounter (Signed)
Spoke with patient. She stated that she has finished the prednisone that her PCP gave her on 4/24. She is still experiencing a cough and some chest tightness. Denies any fever, SOB or body aches.  She also stated that she took the Singulair for a few days and then stopped due to the side effects. She started to feel "swimmy headed" and not like herself with the Singulair.   She stated that she was advised by CY to call back and get an appt. Advised her that CY's schedule was booked and offered an appt with one of our NPs, she declined and stated that she only wanted to see CY.   Pharmacy is [facility] in [facility].   CY, please advise. Thanks!

## 2018-09-08 NOTE — Telephone Encounter (Signed)
See if she can add on at 12 tomorrow

## 2018-09-08 NOTE — Telephone Encounter (Signed)
Spoke with patient. She has been scheduled to se CY at 1130 tomorrow (he had a cancellation). She verbalized understanding. Nothing further needed at time of call.

## 2018-09-08 NOTE — Telephone Encounter (Signed)
Left message for patient to call back  

## 2018-09-09 ENCOUNTER — Other Ambulatory Visit: Payer: Self-pay

## 2018-09-09 ENCOUNTER — Encounter: Payer: Self-pay | Admitting: Internal Medicine

## 2018-09-09 ENCOUNTER — Ambulatory Visit (INDEPENDENT_AMBULATORY_CARE_PROVIDER_SITE_OTHER): Payer: 59

## 2018-09-09 ENCOUNTER — Ambulatory Visit: Payer: 59 | Admitting: Internal Medicine

## 2018-09-09 VITALS — BP 100/82 | HR 78 | Temp 98.2°F | Ht 59.0 in | Wt 107.8 lb

## 2018-09-09 DIAGNOSIS — J45901 Unspecified asthma with (acute) exacerbation: Secondary | ICD-10-CM

## 2018-09-09 DIAGNOSIS — J4520 Mild intermittent asthma, uncomplicated: Secondary | ICD-10-CM

## 2018-09-09 LAB — CBC WITH DIFFERENTIAL/PLATELET
Basophils Absolute: 0 10*3/uL (ref 0.0–0.1)
Basophils Relative: 0.9 % (ref 0.0–3.0)
Eosinophils Absolute: 0.2 10*3/uL (ref 0.0–0.7)
Eosinophils Relative: 4.4 % (ref 0.0–5.0)
HCT: 40 % (ref 36.0–46.0)
Hemoglobin: 14 g/dL (ref 12.0–15.0)
Lymphocytes Relative: 42.7 % (ref 12.0–46.0)
Lymphs Abs: 2 10*3/uL (ref 0.7–4.0)
MCHC: 35.1 g/dL (ref 30.0–36.0)
MCV: 98.1 fl (ref 78.0–100.0)
Monocytes Absolute: 0.4 10*3/uL (ref 0.1–1.0)
Monocytes Relative: 9.2 % (ref 3.0–12.0)
Neutro Abs: 2 10*3/uL (ref 1.4–7.7)
Neutrophils Relative %: 42.8 % — ABNORMAL LOW (ref 43.0–77.0)
Platelets: 265 10*3/uL (ref 150.0–400.0)
RBC: 4.08 Mil/uL (ref 3.87–5.11)
RDW: 12.2 % (ref 11.5–15.5)
WBC: 4.6 10*3/uL (ref 4.0–10.5)

## 2018-09-09 MED ORDER — METHYLPREDNISOLONE ACETATE 80 MG/ML IJ SUSP
40.0000 mg | Freq: Once | INTRAMUSCULAR | Status: AC
  Administered 2018-09-09: 40 mg via INTRAMUSCULAR

## 2018-09-09 NOTE — Progress Notes (Signed)
Patient ID: Syncere Kaminski, female    DOB: Jul 12, 1965, 53 y.o.   MRN: [id number]  HPI female never smoker followed for allergic rhinitis, asthma. Complicated by History anxiety, GERD, IBS Office Spirometry 08/01/2015-within normal. FVC 3.02/112%, FEV1 2.36/104%, FEV1/FVC 0.78, FEF 25-75 percent 2.26/84%. PFT 11/26/2010 within normal limits within significant response to bronchodilator. FEV1/FVC 0.79  --------------------------------------------------------------------------------   07/07/2018  53 year old female never smoker followed for allergic rhinitis, asthma.  Complicated by GERD, history anxiety, IBS -----feels heaviness in chest, dry cough; headaches & pressure in head, nasal dripping; states she thinks it's seasonal allergy-related Managed for uterine fibroid, then took abs for H.pylori.  C/O intermittent headache, rhinorrhea, nasal stuffiness. 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  Has been given hydroxyzine for sleep an menopausal symptoms. Denies wheeze. CXR 05/05/2018 No rib fractures or bone destruction identified. No acute abnormalities.  09/09/2018- 53 year old female never smoker followed for allergic rhinitis, asthma.  Complicated by GERD, history anxiety, IBS Phone Note 09/08/2018- She stated that she has finished the prednisone that her PCP gave her on 4/24. She is still experiencing a cough and some chest tightness. Denies any fever, SOB or body aches.  She also stated that she took the Singulair for a few days and then stopped due to the side effects. She started to feel "swimmy headed" and not like herself with the Singulair. Qvar Redihaler, albuterol hfa, neb albuterol, Flonase,  Malaise x 1 month. Finished medrol taper 2 weeks ago- did help tight throt and chest. Some mild wheeze supine. Tessalon helped. Chest feels "weird", tight with scant clear bubbly mucus. Alavert helped. 57 yo son similar.  SarCoV2 IgG Neg 08/24/2018    Review of Systems-see HPI     + = positive Constitutional:   No-   weight loss, night sweats, fevers, chills, fatigue, lassitude. HEENT:    headaches, difficulty swallowing, tooth/dental problems, sore throat,       No-  sneezing, itching, ear ache, nasal congestion, +post nasal drip,  CV:  No-   chest pain, orthopnea, PND, swelling in lower extremities, anasarca, dizziness, palpitations Resp: shortness of breath with exertion or at rest.                +non-productive cough,  No- coughing up of blood.                 change in color of mucus.  No recent  wheezing.   Skin: No-   rash or lesions. GI:  No-   heartburn, indigestion, abdominal pain, nausea, vomiting, GU:  MS:  No-   joint pain or swelling.   Neuro-     nothing unusual Psych:  No- change in mood or affect. No depression + anxiety.  No memory loss.  Objective:   Physical Exam General- Alert, Oriented, Affect-anxious/ talkative, Distress- none acute, trim, petite woman    Skin- rash-none, lesions- none, excoriation- none Lymphadenopathy- none Head- atraumatic            Eyes- Gross vision intact, PERRLA, conjunctivae clear secretions            Ears- Hearing, canals normal. +Scarring or sclerosis TMs            Nose- +Narrow with turbinate edema, clear mucus bridging, No- Septal dev, polyps, erosion, perforation             Throat- Mallampati III , mucosa clear , drainage- none, tonsils- atrophic Neck- flexible ,  trachea midline, no stridor , thyroid nl, carotid no bruit Chest - symmetrical excursion , unlabored           Heart/CV- RRR , no murmur , no gallop  , no rub, nl s1 s2                           - JVD- none , edema- none, stasis changes- none, varices- none           Lung- clear to P&A, wheeze- none, cough- none , dullness-none, rub- none           Chest wall-  Abd-  Br/ Gen/ Rectal- Not done, not indicated Extrem- cyanosis- none, clubbing, none, atrophy- none, strength- nl Neuro- grossly intact to  observation

## 2018-09-09 NOTE — Patient Instructions (Signed)
Order- CBC w diff     dx asthma exacerbation  Order- CXR    Order- Depo [place] to try an otc antihistamine or cold/ flu remedy for symptomatic help if you want.

## 2018-09-13 ENCOUNTER — Telehealth: Payer: Self-pay | Admitting: Internal Medicine

## 2018-09-13 NOTE — Telephone Encounter (Signed)
If Qvar wasn't helpful, then ok to stay off it and wait to see how she feels. Qvar is a gentle preventative for daily use, and she won't feel it work the way she would with an active bronchodilator medicine.

## 2018-09-13 NOTE — Telephone Encounter (Signed)
Called and spoke with pt letting her know the info stated by CY. Pt expressed understanding and stated when she was not using Qvar, she did begin to cough again so she did start taking it again and pt stated once began taking Qvar the cough stopped. Pt stated she will continue to use Qvar and is only doing it once daily so she can see if that would keep her cough away. Nothing further needed.

## 2018-09-13 NOTE — Telephone Encounter (Signed)
Pt wants to know if she needs to go back on Qvar? She says she informed at ov that pcp put her on some prednisone and told her to d/c Qvar. She says she didn't feel Qvar was helpful.   Pt aware.  CXR- normal chest xray. No pneumonia or other active process seen.

## 2018-09-14 ENCOUNTER — Other Ambulatory Visit: Payer: Self-pay

## 2018-09-14 MED ORDER — BENZONATATE 200 MG PO CAPS: 200.0000 mg | ORAL_CAPSULE | Freq: Three times a day (TID) | ORAL | 0 refills | Status: DC | PRN

## 2018-10-03 ENCOUNTER — Telehealth: Payer: Self-pay | Admitting: *Deleted

## 2018-10-03 NOTE — Telephone Encounter (Signed)
Pt c/o intermittent cough ( clear mucus)/ no wheezing/ occasional sob, no chest tightness. Pt currently on Singulair and Delsym daily. Pt d/c Qvar last month as she didn't feel it was helpful. Please advise  Pharmacy:[facility]

## 2018-10-04 NOTE — Telephone Encounter (Signed)
LMTCB

## 2018-10-04 NOTE — Telephone Encounter (Signed)
Suggest we offer sample Bevespi     Inhale 1 (one ) puff, twice daily. See if this helps the cough

## 2018-10-19 ENCOUNTER — Telehealth: Payer: Self-pay | Admitting: *Deleted

## 2018-10-19 NOTE — Telephone Encounter (Signed)
A message was left, re: follow up visit. 

## 2018-10-28 NOTE — Assessment & Plan Note (Signed)
Mild asthmatic bronchitis syndrome- nonspecific viral or allergic, given time of year. Exam is benign. Plan- Depo 40, CBC w diff, CXR, fluid, otc symptomatic Rx as needed.

## 2018-10-31 NOTE — Progress Notes (Signed)
HPI: FU CP.  ABIs May 2017 normal.  Monitor October 2017 showed sinus rhythm.  Also seen for syncopal episode in October 2017.  MRI negative.  EEG normal.  Seen by neurology and felt to have postconcussive syndrome.  Exercise treadmill October 2018 was negative.  Echocardiogram March 2019 showed normal LV systolic function.    Patient was seen in the emergency room March 20 following a syncopal episode.  This occurred after coughing and having a bowel movement.  Since last seen  there is no dyspnea on exertion, orthopnea, PND, pedal edema, palpitations.  No further syncopal episodes.  Current Outpatient Medications  Medication Sig Dispense Refill  . albuterol (PROVENTIL) (2.5 MG/3ML) 0.083% nebulizer solution Take 3 mLs (2.5 mg total) by nebulization every 6 (six) hours as needed for wheezing or shortness of breath. 75 mL 0  . albuterol (VENTOLIN HFA) 108 (90 Base) MCG/ACT inhaler Inhale 2 puffs into the lungs every 6 (six) hours as needed for wheezing or shortness of breath. 1 Inhaler 6  . benzonatate (TESSALON) 200 MG capsule Take 1 capsule (200 mg total) by mouth 3 (three) times daily as needed for cough. 30 capsule 0  . fluticasone (FLONASE) 50 MCG/ACT nasal spray Place 2 sprays into both nostrils daily. 16 g 1  . hydrOXYzine (ATARAX/VISTARIL) 10 MG tablet Take 1/2 or 1 tablet every 8 hours as needed for anxiety 30 tablet 0  . Multiple Vitamins-Minerals (MULTIVITAMIN ADULT PO) Take 1 tablet by mouth daily.    Marland Kitchen OVER THE COUNTER MEDICATION Calcium    . Probiotic Product (PROBIOTIC DAILY PO) Take 1 tablet by mouth daily.     No current facility-administered medications for this visit.      Past Medical History:  Diagnosis Date  . Allergic rhinitis   . Anemia   . Anxiety   . Asthma   . Contact dermatitis and eczema   . Diverticulitis    CT Scan  . Esophageal reflux   . H. pylori infection   . Hyperlipidemia, mixed   . IBS (irritable bowel syndrome)   . IC (interstitial  cystitis)   . Internal hemorrhoids   . Lumbago   . Mitral valve disorders(424.0)   . Osteoarthritis 07/08/2015  . Post concussive syndrome   . Seasonal and perennial allergic rhinitis 01/20/2015    Past Surgical History:  Procedure Laterality Date  . BARTHOLIN CYST MARSUPIALIZATION N/A 12/18/2013   Procedure: BARTHOLIN CYST MARSUPIALIZATION WITH BIOPSY;  Surgeon: Lovenia Kim, MD;  Location: [facility];  Service: Gynecology;  Laterality: N/A;  . CESAREAN SECTION    . DILITATION & CURRETTAGE/HYSTROSCOPY WITH NOVASURE ABLATION N/A 10/14/2012   Procedure: DILATATION & CURETTAGE/HYSTEROSCOPY WITH NOVASURE ABLATION;  Surgeon: Lovenia Kim, MD;  Location: [facility];  Service: Gynecology;  Laterality: N/A;  . MOUTH SURGERY    . SINUS SURGERY WITH INSTATRAK      Social History   Socioeconomic History  . Marital status: Married    Spouse name: Not on file  . Number of children: 2  . Years of education: Masters  . Highest education level: Not on file  Occupational History  . Occupation: [employer]  Social Needs  . Financial resource strain: Not on file  . Food insecurity    Worry: Not on file    Inability: Not on file  . Transportation needs    Medical: Not on file    Non-medical: Not on file  Tobacco Use  . Smoking  status: Never Smoker  . Smokeless tobacco: Never Used  Substance and Sexual Activity  . Alcohol use: Yes    Comment: Holidays only  . Drug use: No  . 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 Physical activity    Days per week: Not on file    Minutes per session: Not on file  . Stress: Not on file  Relationships  . Social Herbalist on phone: Not on file    Gets together: Not on file    Attends religious service: Not on file    Active member of club or organization: Not on file    Attends meetings of clubs or organizations: Not on file     Relationship status: Not on file  . Intimate partner violence    Fear of current or ex partner: Not on file    Emotionally abused: Not on file    Physically abused: Not on file    Forced sexual activity: Not on file  Other Topics Concern  . Not on file  Social History Narrative   Lives at home home with husband and son.   Right-handed.   1 cup caffeine daily.    Family History  Problem Relation Age of Onset  . Colon polyps Sister   . Mental illness Sister        anxiety from 9/11 survivors  . Varicose Veins Sister   . Diabetes Mother   . Hypertension Mother   . Heart attack Mother        MI at age 38  . Alcohol abuse Mother        smoker  . Heart disease Mother   . Dementia Mother   . Hypertension Father   . Hyperlipidemia Father   . Heart attack Father   . Cancer Father        lung cancer, smoker  . Cancer Brother        acute leukemia  . Dementia Maternal Grandmother   . Heart disease Maternal Grandfather        mi  . Cancer Paternal Grandmother        GYN cancer  . Other Sister        hypoglycemia  . Varicose Veins Sister   . Rheum arthritis Sister   . Pleurisy Sister   . Fibromyalgia Sister   . Mental retardation Sister        depression  . GER disease Son   . Alcohol abuse Other        Family history  . Colon cancer Neg Hx   . Breast cancer Neg Hx     ROS: no fevers or chills, productive cough, hemoptysis, dysphasia, odynophagia, melena, hematochezia, dysuria, hematuria, rash, seizure activity, orthopnea, PND, pedal edema, claudication. Remaining systems are negative.  Physical Exam: Well-developed well-nourished in no acute distress.  Skin is warm and dry.  HEENT is normal.  Neck is supple.  Chest is clear to auscultation with normal expansion.  Cardiovascular exam is regular rate and rhythm.  Abdominal exam nontender or distended. No masses palpated. Extremities show no edema. neuro grossly intact  ECG-July 14, 2018-sinus rhythm with no ST  changes.  Personally reviewed  A/P  1 syncope-patient had a recent episode following bowel movement likely defecation syncope.  No recurrent episodes.  She has a history of vasovagal syncope.  Previous echocardiogram showed normal LV  function.  We discussed maintaining hydration and increasing sodium intake.  Therapy for cough syncope is treating cough.  2 chest pain-she has not had any recurrent chest pain.  3 hyperlipidemia-followed by primary care.  4 question history of mitral valve prolapse-not evident on most recent echo.  Kirk Ruths, MD

## 2018-11-02 ENCOUNTER — Encounter: Payer: Self-pay | Admitting: Cardiology

## 2018-11-02 ENCOUNTER — Ambulatory Visit (INDEPENDENT_AMBULATORY_CARE_PROVIDER_SITE_OTHER): Payer: 59 | Admitting: Cardiology

## 2018-11-02 ENCOUNTER — Other Ambulatory Visit: Payer: Self-pay

## 2018-11-02 VITALS — BP 128/84 | HR 93 | Ht 59.0 in | Wt 109.8 lb

## 2018-11-02 DIAGNOSIS — E785 Hyperlipidemia, unspecified: Secondary | ICD-10-CM | POA: Diagnosis not present

## 2018-11-02 DIAGNOSIS — R079 Chest pain, unspecified: Secondary | ICD-10-CM | POA: Diagnosis not present

## 2018-11-02 DIAGNOSIS — R55 Syncope and collapse: Secondary | ICD-10-CM

## 2018-11-02 NOTE — Patient Instructions (Signed)
Medication Instructions:  NO CHANGE If you need a refill on your cardiac medications before your next appointment, please call your pharmacy.   Lab work: If you have labs (blood work) drawn today and your tests are completely normal, you will receive your results only by: Marland Kitchen MyChart Message (if you have MyChart) OR . A paper copy in the mail If you have any lab test that is abnormal or we need to change your treatment, we will call you to review the results.  Follow-Up: At [facility], you and your health needs are our priority.  As part of our continuing mission to provide you with exceptional heart care, we have created designated Provider Care Teams.  These Care Teams include your primary Cardiologist (physician) and Advanced Practice Providers (APPs -  Physician Assistants and Nurse Practitioners) who all work together to provide you with the care you need, when you need it. You will need a follow up appointment in 12 months.  Please call our office 2 months in advance to schedule this appointment.  You will see Kirk Ruths MD

## 2018-12-23 ENCOUNTER — Ambulatory Visit (INDEPENDENT_AMBULATORY_CARE_PROVIDER_SITE_OTHER): Payer: 59

## 2018-12-23 ENCOUNTER — Other Ambulatory Visit: Payer: Self-pay

## 2018-12-23 DIAGNOSIS — Z23 Encounter for immunization: Secondary | ICD-10-CM | POA: Diagnosis not present

## 2018-12-28 ENCOUNTER — Ambulatory Visit: Payer: 59 | Admitting: Family Medicine

## 2018-12-28 ENCOUNTER — Other Ambulatory Visit: Payer: Self-pay

## 2018-12-29 ENCOUNTER — Other Ambulatory Visit: Payer: Self-pay

## 2018-12-29 ENCOUNTER — Telehealth: Payer: Self-pay

## 2018-12-29 DIAGNOSIS — Z20822 Contact with and (suspected) exposure to covid-19: Secondary | ICD-10-CM

## 2018-12-29 NOTE — Telephone Encounter (Signed)
Copied from [place] [id number]. Topic: General - Other >> Dec 28, 2018  6:02 PM Erick Blinks wrote: Reason for CRM: Pt requesting out of work note for being exposed to son that has tested positive for Covid 19, she feels unusual symptoms herself and is getting tested. Please advise

## 2018-12-30 LAB — NOVEL CORONAVIRUS, NAA: SARS-CoV-2, NAA: NOT DETECTED

## 2018-12-30 NOTE — Telephone Encounter (Signed)
Message sent to patient via mychart

## 2019-01-04 ENCOUNTER — Telehealth: Payer: 59 | Admitting: Family Medicine

## 2019-01-05 ENCOUNTER — Telehealth: Payer: Self-pay | Admitting: Internal Medicine

## 2019-01-05 NOTE — Telephone Encounter (Signed)
Called and spoke with Patient. Dr Annamaria Boots recommendations given. Patient stated her son was tested positive 2 weeks ago for Covid. Patient stated she and her Husband were tested for Covid and there results are both negative.  Patient stated her concern was she has clear drainage, with cough.  Patient stated she has some chest heaviness, and occasional sob, but she thought it could be related to anxiety.  Patient stated she wanted to know if she use her Albuterol inhaler, and nebs to help with cough.  Message routed to Dr Annamaria Boots

## 2019-01-05 NOTE — Telephone Encounter (Signed)
Do you mean her son had a COLD 2 weeks ago? You said covid.  I suggest we send her for nasal swab for her peace of mind. . Meanwhile she can treat herself with her usual meds as if this were a mild cold. She should try to limit her exposure to others until test results come back.

## 2019-01-05 NOTE — Telephone Encounter (Signed)
Spoke with pt, she states he son had a covid 2 weeks ago and now she is coughing with clear mucus. She hasn't had any chills, fever, or lost of taste or smell. She is thinking she may be having an asthma flare but is unsure on what to do. She is not having much SOB but does report a heavy feeling in her chest. CY please advise if she should do anything at this point.  Current Outpatient Medications on File Prior to Visit  Medication Sig Dispense Refill  . albuterol (PROVENTIL) (2.5 MG/3ML) 0.083% nebulizer solution Take 3 mLs (2.5 mg total) by nebulization every 6 (six) hours as needed for wheezing or shortness of breath. 75 mL 0  . albuterol (VENTOLIN HFA) 108 (90 Base) MCG/ACT inhaler Inhale 2 puffs into the lungs every 6 (six) hours as needed for wheezing or shortness of breath. 1 Inhaler 6  . benzonatate (TESSALON) 200 MG capsule Take 1 capsule (200 mg total) by mouth 3 (three) times daily as needed for cough. 30 capsule 0  . fluticasone (FLONASE) 50 MCG/ACT nasal spray Place 2 sprays into both nostrils daily. 16 g 1  . hydrOXYzine (ATARAX/VISTARIL) 10 MG tablet Take 1/2 or 1 tablet every 8 hours as needed for anxiety 30 tablet 0  . Multiple Vitamins-Minerals (MULTIVITAMIN ADULT PO) Take 1 tablet by mouth daily.    Marland Kitchen OVER THE COUNTER MEDICATION Calcium    . Probiotic Product (PROBIOTIC DAILY PO) Take 1 tablet by mouth daily.     No current facility-administered medications on file prior to visit.    Allergies  Allergen Reactions  . Amoxicillin Shortness Of Breath    Doesn't remember rxn  . Aspirin Shortness Of Breath    "irritated stomach" Ibuprofen and Naproxen do not agree with her either  . Codeine Hives and Shortness Of Breath    Does not take percocet or hydrocodone; tylenol only  . Latex Shortness Of Breath and Itching  . Other Shortness Of Breath    peanuts  . Sulfa Antibiotics Hives and Shortness Of Breath  . Sulfamethoxazole-Trimethoprim Shortness Of Breath  . Sulfonamide  Derivatives Shortness Of Breath and Itching  . Sulphadimidine Sodium [Sulfamethazine Sodium] Shortness Of Breath    Added to food for preservative  . Ciprofloxacin Other (See Comments)    REACTION: SOB, "Tightness in head" REACTION: SOB, "Tightness in head" Syncope   . Azithromycin Other (See Comments)    Faintness  . Carisoprodol   . Clarithromycin     "Doesn't agree with me"  . Epinephrine     Heart racing  . Metronidazole Other (See Comments)    "cannot tolerate"  . Nsaids   . Penicillins     Doesn't remember reaction  . Tolmetin   . Omeprazole Palpitations

## 2019-01-05 NOTE — Telephone Encounter (Signed)
Fine for her to use her asthma meds as needed and see if they help.   Even if she tested negative for Covid before, it may be appropriate to retest if she gets worse, or if she remains concerned early next week.

## 2019-01-05 NOTE — Telephone Encounter (Signed)
ATC pt, line went to voicemail. I have left a detailed voice message (per pt's DPR) regarding CY's response below and let her know to give our office a call back with her decision on whether to get tested again or not. Nothing further needed at this time.

## 2019-01-07 ENCOUNTER — Other Ambulatory Visit: Payer: Self-pay

## 2019-01-07 ENCOUNTER — Ambulatory Visit (INDEPENDENT_AMBULATORY_CARE_PROVIDER_SITE_OTHER): Payer: 59 | Admitting: Family Medicine

## 2019-01-07 ENCOUNTER — Encounter: Payer: Self-pay | Admitting: Family Medicine

## 2019-01-07 DIAGNOSIS — L03011 Cellulitis of right finger: Secondary | ICD-10-CM | POA: Diagnosis not present

## 2019-01-07 DIAGNOSIS — J4520 Mild intermittent asthma, uncomplicated: Secondary | ICD-10-CM

## 2019-01-07 DIAGNOSIS — B079 Viral wart, unspecified: Secondary | ICD-10-CM

## 2019-01-07 MED ORDER — MUPIROCIN 2 % EX OINT: 1.0000 "application " | TOPICAL_OINTMENT | Freq: Two times a day (BID) | CUTANEOUS | 0 refills | Status: AC

## 2019-01-07 NOTE — Progress Notes (Signed)
Virtual Visit via Video Note   I connected with [name] on 01/07/19 by a video enabled telemedicine application and verified that I am speaking with the correct person using two identifiers.  Location patient: home Location provider:work office Persons participating in the virtual visit: patient, provider  I discussed the limitations of evaluation and management by telemedicine and the availability of in person appointments. The patient expressed understanding and agreed to proceed.  HPI: [name] is a 53 yo female with Hx of asthma,back pain,and fatigue c/o right index finger pain. For the past few days she has been treated warts with OTC product, 2 fell but the one on right index got tender and dark. She denies edema or erythema, it hurts upon palpation. Denies limitation of IP joints. Problem has been stable.  Started cleaning area with iodine and has applied neosporin. Negative for associated fever, chills, body aches, or fatigue.  She is also c/o nonproductive cough and "tiny"chest tightness for about 2 days. She thinks it may be related to allergies.  Intermittent mild frontal pressure headache, nasal congestion,and rhinorrhea. Denies associated fever, chills, body aches, decreased appetite, chest pain, wheezing, abdominal pain, nausea, vomiting, diarrhea, or skin rash. She has not noted changes in the smell or taste.  Symptoms are stable.  History of asthma,she has not used Albuterol. She follows with Dr. Annamaria Boots.  Her son recently diagnosed with COVID-19 infection,10-14 days ago. She was tested about a week ago, negative.  ROS: See pertinent positives and negatives per HPI.  Past Medical History:  Diagnosis Date  . Allergic rhinitis   . Anemia   . Anxiety   . Asthma   . Contact dermatitis and eczema   . Diverticulitis    CT Scan  . Esophageal reflux   . H. pylori infection   . Hyperlipidemia, mixed   . IBS (irritable bowel syndrome)   . IC (interstitial  cystitis)   . Internal hemorrhoids   . Lumbago   . Mitral valve disorders(424.0)   . Osteoarthritis 07/08/2015  . Post concussive syndrome   . Seasonal and perennial allergic rhinitis 01/20/2015    Past Surgical History:  Procedure Laterality Date  . BARTHOLIN CYST MARSUPIALIZATION N/A 12/18/2013   Procedure: BARTHOLIN CYST MARSUPIALIZATION WITH BIOPSY;  Surgeon: Lovenia Kim, MD;  Location: [facility];  Service: Gynecology;  Laterality: N/A;  . CESAREAN SECTION    . DILITATION & CURRETTAGE/HYSTROSCOPY WITH NOVASURE ABLATION N/A 10/14/2012   Procedure: DILATATION & CURETTAGE/HYSTEROSCOPY WITH NOVASURE ABLATION;  Surgeon: Lovenia Kim, MD;  Location: [facility];  Service: Gynecology;  Laterality: N/A;  . MOUTH SURGERY    . SINUS SURGERY WITH INSTATRAK      Family History  Problem Relation Age of Onset  . Colon polyps Sister   . Mental illness Sister        anxiety from 9/11 survivors  . Varicose Veins Sister   . Diabetes Mother   . Hypertension Mother   . Heart attack Mother        MI at age 31  . Alcohol abuse Mother        smoker  . Heart disease Mother   . Dementia Mother   . Hypertension Father   . Hyperlipidemia Father   . Heart attack Father   . Cancer Father        lung cancer, smoker  . Cancer Brother        acute leukemia  . Dementia Maternal Grandmother   . Heart disease  Maternal Grandfather        mi  . Cancer Paternal Grandmother        GYN cancer  . Other Sister        hypoglycemia  . Varicose Veins Sister   . Rheum arthritis Sister   . Pleurisy Sister   . Fibromyalgia Sister   . Mental retardation Sister        depression  . GER disease Son   . Alcohol abuse Other        Family history  . Colon cancer Neg Hx   . Breast cancer Neg Hx     Social History   Socioeconomic History  . Marital status: Married    Spouse name: Not on file  . Number of children: 2  . Years of education: Masters  . Highest education level: Not on file  Occupational  History  . Occupation: [employer]  Social Needs  . Financial resource strain: Not on file  . Food insecurity    Worry: Not on file    Inability: Not on file  . Transportation needs    Medical: Not on file    Non-medical: Not on file  Tobacco Use  . Smoking status: Never Smoker  . Smokeless tobacco: Never Used  Substance and Sexual Activity  . Alcohol use: Yes    Comment: Holidays only  . Drug use: No  . 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 Physical activity    Days per week: Not on file    Minutes per session: Not on file  . Stress: Not on file  Relationships  . Social Herbalist on phone: Not on file    Gets together: Not on file    Attends religious service: Not on file    Active member of club or organization: Not on file    Attends meetings of clubs or organizations: Not on file    Relationship status: Not on file  . Intimate partner violence    Fear of current or ex partner: Not on file    Emotionally abused: Not on file    Physically abused: Not on file    Forced sexual activity: Not on file  Other Topics Concern  . Not on file  Social History Narrative   Lives at home home with husband and son.   Right-handed.   1 cup caffeine daily.    Current Outpatient Medications:  .  albuterol (PROVENTIL) (2.5 MG/3ML) 0.083% nebulizer solution, Take 3 mLs (2.5 mg total) by nebulization every 6 (six) hours as needed for wheezing or shortness of breath., Disp: 75 mL, Rfl: 0 .  albuterol (VENTOLIN HFA) 108 (90 Base) MCG/ACT inhaler, Inhale 2 puffs into the lungs every 6 (six) hours as needed for wheezing or shortness of breath., Disp: 1 Inhaler, Rfl: 6 .  benzonatate (TESSALON) 200 MG capsule, Take 1 capsule (200 mg total) by mouth 3 (three) times daily as needed for cough., Disp: 30 capsule, Rfl: 0 .  fluticasone (FLONASE) 50 MCG/ACT nasal spray, Place 2  sprays into both nostrils daily., Disp: 16 g, Rfl: 1 .  hydrOXYzine (ATARAX/VISTARIL) 10 MG tablet, Take 1/2 or 1 tablet every 8 hours as needed for anxiety, Disp: 30 tablet, Rfl: 0 .  Multiple Vitamins-Minerals (MULTIVITAMIN ADULT PO), Take 1 tablet by mouth daily., Disp: , Rfl:  .  mupirocin ointment (  BACTROBAN) 2 %, Apply 1 application topically 2 (two) times daily for 10 days., Disp: 30 g, Rfl: 0 .  OVER THE COUNTER MEDICATION, Calcium, Disp: , Rfl:  .  Probiotic Product (PROBIOTIC DAILY PO), Take 1 tablet by mouth daily., Disp: , Rfl:   EXAM:  VITALS per patient if applicable:N/A  GENERAL: alert, oriented, appears well and in no acute distress  HEENT: atraumatic, conjunctiva clear, no obvious abnormalities on inspection. No stridor appreciated. Mild nasal voice.   NECK: normal movements of the head and neck  LUNGS: on inspection no signs of respiratory distress, breathing rate appears normal, no obvious gross SOB, gasping or wheezing  CV: no obvious cyanosis  Mallory: moves all visible extremities without noticeable abnormality  SKIN: Dark depressed area radial aspect of right index. I do not appreciate edema, ? Of mild surrounded erythema (she doe snot think so). See picture.  PSYCH/NEURO: pleasant and cooperative, no obvious depression or anxiety, speech and thought processing grossly intact.     ASSESSMENT AND PLAN:  Discussed the following assessment and plan:  Cellulitis of finger of right hand Most likely area is irritated by OTC wart product,recommend he stop using product. I recommend topical Mupirocin. Keep area clean with soap and water. Avoid cleaning area with iodine peroxide. Monitor for worsening pain.  Allergic asthma, mild intermittent, uncomplicated Respiratory symptoms she is reporting can certainly being related to asthma and allergies but given the fact she was exposed to COVID 19,I recommend having test repeated.  According to patient, her  pulmonologist asked her to call his office Monday to have this arranged. Albuterol 2 puffs every 4-6 hours as needed. Clearly instructed about warning signs.  Viral wart on finger Recommend avoiding use of OTC products,possible side effects discussed. If new wart,it could be treated with liquid nitrogen in the office.  I discussed the assessment and treatment plan with the patient. She was provided an opportunity to ask questions and all were answered. She agreed with the plan and demonstrated an understanding of the instructions.   The patient was advised to call back or seek an in-person evaluation if the symptoms worsen or if the condition fails to improve as anticipated.  Return if symptoms worsen or fail to improve.    [place], MD

## 2019-01-09 ENCOUNTER — Ambulatory Visit: Payer: Self-pay

## 2019-01-09 ENCOUNTER — Encounter (HOSPITAL_BASED_OUTPATIENT_CLINIC_OR_DEPARTMENT_OTHER): Payer: Self-pay | Admitting: Emergency Medicine

## 2019-01-09 ENCOUNTER — Emergency Department (HOSPITAL_BASED_OUTPATIENT_CLINIC_OR_DEPARTMENT_OTHER)
Admission: EM | Admit: 2019-01-09 | Discharge: 2019-01-09 | Disposition: A | Discharge status: Home / Self Care | Payer: 59 | Attending: Emergency Medicine | Admitting: Emergency Medicine

## 2019-01-09 ENCOUNTER — Other Ambulatory Visit: Payer: Self-pay

## 2019-01-09 ENCOUNTER — Emergency Department (HOSPITAL_BASED_OUTPATIENT_CLINIC_OR_DEPARTMENT_OTHER): Payer: 59

## 2019-01-09 DIAGNOSIS — R5383 Other fatigue: Secondary | ICD-10-CM | POA: Diagnosis not present

## 2019-01-09 DIAGNOSIS — Z79899 Other long term (current) drug therapy: Secondary | ICD-10-CM | POA: Diagnosis not present

## 2019-01-09 DIAGNOSIS — R079 Chest pain, unspecified: Secondary | ICD-10-CM

## 2019-01-09 DIAGNOSIS — Z20828 Contact with and (suspected) exposure to other viral communicable diseases: Secondary | ICD-10-CM | POA: Insufficient documentation

## 2019-01-09 DIAGNOSIS — J45909 Unspecified asthma, uncomplicated: Secondary | ICD-10-CM | POA: Diagnosis not present

## 2019-01-09 DIAGNOSIS — R0789 Other chest pain: Secondary | ICD-10-CM | POA: Diagnosis not present

## 2019-01-09 DIAGNOSIS — Z91040 Latex allergy status: Secondary | ICD-10-CM | POA: Insufficient documentation

## 2019-01-09 LAB — BASIC METABOLIC PANEL
Anion gap: 10 (ref 5–15)
BUN: 17 mg/dL (ref 6–20)
CO2: 25 mmol/L (ref 22–32)
Calcium: 9.7 mg/dL (ref 8.9–10.3)
Chloride: 104 mmol/L (ref 98–111)
Creatinine, Ser: 0.66 mg/dL (ref 0.44–1.00)
GFR calc Af Amer: >60 mL/min (ref >60)
GFR calc non Af Amer: >60 mL/min (ref >60)
Glucose, Bld: 93 mg/dL (ref 70–99)
Potassium: 3.9 mmol/L (ref 3.5–5.1)
Sodium: 139 mmol/L (ref 135–145)

## 2019-01-09 LAB — CBC
HCT: 43 % (ref 36.0–46.0)
Hemoglobin: 14.9 g/dL (ref 12.0–15.0)
MCH: 33.3 pg (ref 26.0–34.0)
MCHC: 34.7 g/dL (ref 30.0–36.0)
MCV: 96 fL (ref 80.0–100.0)
Platelets: 282 10*3/uL (ref 150–400)
RBC: 4.48 MIL/uL (ref 3.87–5.11)
RDW: 11 % — ABNORMAL LOW (ref 11.5–15.5)
WBC: 4.6 10*3/uL (ref 4.0–10.5)
nRBC: 0 % (ref 0.0–0.2)

## 2019-01-09 LAB — TROPONIN I (HIGH SENSITIVITY): Troponin I (High Sensitivity): 2 ng/L (ref <18)

## 2019-01-09 NOTE — ED Provider Notes (Signed)
[facility] EMERGENCY DEPARTMENT Provider Note   CSN: [id number] Arrival date & time: 01/09/19  1741     History   Chief Complaint Chief Complaint  Patient presents with  . Chest Pain    HPI [name] is a 53 y.o. female.     HPI Patient was leaving work around 4:00 today.  Began to feel fluttering and some pressure in her left upper chest.  States she felt as if her heart was going very fast.  States she checked her watch and was rate of around 100.  Feeling better now.  Still feels little off.  States she felt a little dizzy with the episode.  He been doing well otherwise.  Feels some mild shortness of breath.  Feels as if she may be getting a cough but is not coughing now.  States her son had COVID-19.  She was tested for it and was negative around a week and a half ago, although that was 6 days after exposure to him. Past Medical History:  Diagnosis Date  . Allergic rhinitis   . Anemia   . Anxiety   . Asthma   . Contact dermatitis and eczema   . Diverticulitis    CT Scan  . Esophageal reflux   . H. pylori infection   . Hyperlipidemia, mixed   . IBS (irritable bowel syndrome)   . IC (interstitial cystitis)   . Internal hemorrhoids   . Lumbago   . Mitral valve disorders(424.0)   . Osteoarthritis 07/08/2015  . Post concussive syndrome   . Seasonal and perennial allergic rhinitis 01/20/2015    Patient Active Problem List   Diagnosis Date Noted  . Cough 07/25/2018  . Perimenopausal 01/02/2018  . History of Helicobacter pylori infection 12/28/2017  . Nocturia 07/19/2016  . Left lower quadrant pain 07/19/2016  . Rash and nonspecific skin eruption 03/21/2016  . Syncope 01/08/2016  . Osteoarthritis 07/08/2015  . Hematuria 01/20/2015  . Hyperlipidemia, mild 01/20/2015  . Seasonal and perennial allergic rhinitis 01/20/2015  . Fatigue 10/24/2014  . Chest pressure 10/24/2014  . Preventative health care 06/03/2014  . Diverticulosis 07/01/2011  .  Menopausal state 03/25/2011  . Fibroids 10/24/2010  . Anemia 03/25/2010  . IRRITABLE BOWEL SYNDROME 03/25/2010  . Mitral valve disorder 07/09/2007  . Allergic asthma, mild intermittent, uncomplicated 10/25/1599  . ESOPHAGEAL REFLUX 07/09/2007  . DERMATITIS 07/09/2007  . LOW BACK PAIN 12/14/2006    Past Surgical History:  Procedure Laterality Date  . BARTHOLIN CYST MARSUPIALIZATION N/A 12/18/2013   Procedure: BARTHOLIN CYST MARSUPIALIZATION WITH BIOPSY;  Surgeon: Lovenia Kim, MD;  Location: [facility];  Service: Gynecology;  Laterality: N/A;  . CESAREAN SECTION    . DILITATION & CURRETTAGE/HYSTROSCOPY WITH NOVASURE ABLATION N/A 10/14/2012   Procedure: DILATATION & CURETTAGE/HYSTEROSCOPY WITH NOVASURE ABLATION;  Surgeon: Lovenia Kim, MD;  Location: [facility];  Service: Gynecology;  Laterality: N/A;  . MOUTH SURGERY    . SINUS SURGERY WITH INSTATRAK       OB History   No obstetric history on file.      Home Medications    Prior to Admission medications   Medication Sig Start Date End Date Taking? Authorizing Provider  albuterol (PROVENTIL) (2.5 MG/3ML) 0.083% nebulizer solution Take 3 mLs (2.5 mg total) by nebulization every 6 (six) hours as needed for wheezing or shortness of breath. 07/15/18   Baird Lyons D, MD  albuterol (VENTOLIN HFA) 108 (90 Base) MCG/ACT inhaler Inhale 2 puffs into the lungs every  6 (six) hours as needed for wheezing or shortness of breath. 08/17/18   Baird Lyons D, MD  benzonatate (TESSALON) 200 MG capsule Take 1 capsule (200 mg total) by mouth 3 (three) times daily as needed for cough. 09/14/18   Deneise Lever, MD  fluticasone (FLONASE) 50 MCG/ACT nasal spray Place 2 sprays into both nostrils daily. 01/13/18   Saguier, Percell Miller, PA-C  hydrOXYzine (ATARAX/VISTARIL) 10 MG tablet Take 1/2 or 1 tablet every 8 hours as needed for anxiety 06/04/18   Guse, Jacquelynn Cree, FNP  Multiple Vitamins-Minerals (MULTIVITAMIN ADULT PO) Take 1 tablet by mouth daily.    [provider]  mupirocin ointment (BACTROBAN) 2 % Apply 1 application topically 2 (two) times daily for 10 days. 01/07/19 01/17/19  Martinique, Betty G, MD  OVER THE COUNTER MEDICATION Calcium    [provider]  Probiotic Product (PROBIOTIC DAILY PO) Take 1 tablet by mouth daily.    [provider]    Family History Family History  Problem Relation Age of Onset  . Colon polyps Sister   . Mental illness Sister        anxiety from 9/11 survivors  . Varicose Veins Sister   . Diabetes Mother   . Hypertension Mother   . Heart attack Mother        MI at age 81  . Alcohol abuse Mother        smoker  . Heart disease Mother   . Dementia Mother   . Hypertension Father   . Hyperlipidemia Father   . Heart attack Father   . Cancer Father        lung cancer, smoker  . Cancer Brother        acute leukemia  . Dementia Maternal Grandmother   . Heart disease Maternal Grandfather        mi  . Cancer Paternal Grandmother        GYN cancer  . Other Sister        hypoglycemia  . Varicose Veins Sister   . Rheum arthritis Sister   . Pleurisy Sister   . Fibromyalgia Sister   . Mental retardation Sister        depression  . GER disease Son   . Alcohol abuse Other        Family history  . Colon cancer Neg Hx   . Breast cancer Neg Hx     Social History Social History   Tobacco Use  . Smoking status: Never Smoker  . Smokeless tobacco: Never Used  Substance Use Topics  . Alcohol use: Yes    Comment: Holidays only  . Drug use: No     Allergies   Amoxicillin, Aspirin, Codeine, Latex, Other, Sulfa antibiotics, Sulfamethoxazole-trimethoprim, Sulfonamide derivatives, Sulphadimidine sodium [sulfamethazine sodium], Ciprofloxacin, Azithromycin, Carisoprodol, Clarithromycin, Epinephrine, Metronidazole, Nsaids, Penicillins, Tolmetin, and Omeprazole   Review of Systems Review of Systems  Constitutional: Negative for appetite change.  HENT: Negative for congestion.    Respiratory: Positive for shortness of breath. Negative for cough.   Cardiovascular: Positive for chest pain and palpitations.  Gastrointestinal: Negative for abdominal pain.  Genitourinary: Negative for frequency.  Musculoskeletal: Negative for back pain.  Skin: Negative for rash.  Neurological: Positive for dizziness.  Psychiatric/Behavioral: Negative for confusion.     Physical Exam Updated Vital Signs BP 120/68   Pulse 67   Temp 98.3 F (36.8 C)   Resp 14   Ht 4\' 11"  (1.499 m)   Wt 49 kg  SpO2 99%   BMI 21.81 kg/m   Physical Exam Vitals signs and nursing note reviewed.  HENT:     Head: Atraumatic.  Neck:     Musculoskeletal: Neck supple.  Pulmonary:     Breath sounds: No wheezing, rhonchi or rales.  Chest:     Chest wall: No deformity, tenderness or crepitus.  Musculoskeletal:     Right lower leg: No edema.     Left lower leg: No edema.  Skin:    General: Skin is warm.     Capillary Refill: Capillary refill takes less than 2 seconds.  Neurological:     Mental Status: She is alert and oriented to person, place, and time.      ED Treatments / Results  Labs (all labs ordered are listed, but only abnormal results are displayed) Labs Reviewed  CBC - Abnormal; Notable for the following components:      Result Value   RDW 11.0 (*)    All other components within normal limits  NOVEL CORONAVIRUS, NAA (HOSP ORDER, SEND-OUT TO REF LAB; TAT 18-24 HRS)  BASIC METABOLIC PANEL  TROPONIN I (HIGH SENSITIVITY)    EKG EKG Interpretation  Date/Time:  Monday January 09 2019 17:49:31 EDT Ventricular Rate:  98 PR Interval:  136 QRS Duration: 68 QT Interval:  340 QTC Calculation: 434 R Axis:   82 Text Interpretation:  Normal sinus rhythm Possible Left atrial enlargement Borderline ECG Confirmed by Davonna Belling [phone]) on 01/09/2019 8:06:29 PM   Radiology Dg Chest Portable 1 View  Result Date: 01/09/2019 CLINICAL DATA:  Shortness of breath EXAM: PORTABLE  CHEST 1 VIEW COMPARISON:  05/05/2018 FINDINGS: The heart size and mediastinal contours are within normal limits. Both lungs are clear. The visualized skeletal structures are unremarkable. IMPRESSION: No active disease. Electronically Signed   By: Inez Catalina M.D.   On: 01/09/2019 20:43    Procedures Procedures (including critical care time)  Medications Ordered in ED Medications - No data to display   Initial Impression / Assessment and Plan / ED Course  I have reviewed the triage vital signs and the nursing notes.  Pertinent labs & imaging results that were available during my care of the patient were reviewed by me and considered in my medical decision making (see chart for details).        Patient presents with chest pain fatigue.  Some congestion.  Has had a COVID exposure with her son having had COVID and diagnosed around 2 weeks ago.  Work-up reassuring.  X-ray reassuring.  However test has been done.  Told to isolate.  Not hypoxic.  Discharge home.  Low risk cardiac and doubt pulmonary embolism.  Final Clinical Impressions(s) / ED Diagnoses   Final diagnoses:  Nonspecific chest pain  Fatigue, unspecified type    ED Discharge Orders    None       Davonna Belling, MD 01/09/19 2325

## 2019-01-09 NOTE — ED Notes (Signed)
ED Provider at bedside. 

## 2019-01-09 NOTE — Telephone Encounter (Signed)
Pt called stating that she has a strange feeling in her chest as she was getting out of work today.  It felt like a squeezing left chest.  She states she felt lightheaded and dizzy with a little nausea.. It lasted one minute then went away. She denies other symptoms. She feels the symptoms come with activity. Patient has a cough which she states came on a few days ago Like a tickle in the back of her throat.  She also had neck pain that radiated down her left arm but she decided that it might be from yoga. She had the cold sweats today at work.  Per protocol Patient will go to ER for evaluation.  She refuses EMS transport stating that she will call someone. Care advice read to patient she verbalized understanding.  Reason for Disposition . [1] Chest pain (or "angina") comes and goes AND [2] is happening more often (increasing in frequency) or getting worse (increasing in severity)  Answer Assessment - Initial Assessment Questions 1. LOCATION: "Where does it hurt?"       Left side squeezing 2. RADIATION: "Does the pain go anywhere else?" (e.g., into neck, jaw, arms, back)     No not today left arm a few days ago was ache 3. ONSET: "When did the chest pain begin?" (Minutes, hours or days)      After work at about 4 4. PATTERN "Does the pain come and go, or has it been constant since it started?"  "Does it get worse with exertion?"      Yes with activity 5. DURATION: "How long does it last" (e.g., seconds, minutes, hours)     1 minute 6. SEVERITY: "How bad is the pain?"  (e.g., Scale 1-10; mild, moderate, or severe)    - MILD (1-3): doesn't interfere with normal activities     - MODERATE (4-7): interferes with normal activities or awakens from sleep    - SEVERE (8-10): excruciating pain, unable to do any normal activities      Some SOB 7. CARDIAC RISK FACTORS: "Do you have any history of heart problems or risk factors for heart disease?" (e.g., angina, prior heart attack; diabetes, high blood  pressure, high cholesterol, smoker, or strong family history of heart disease)    Mother angina died of MI 39. PULMONARY RISK FACTORS: "Do you have any history of lung disease?"  (e.g., blood clots in lung, asthma, emphysema, birth control pills)     asthma 9. CAUSE: "What do you think is causing the chest pain?"     Unsure used albuterol yesterday 10. OTHER SYMPTOMS: "Do you have any other symptoms?" (e.g., dizziness, nausea, vomiting, sweating, fever, difficulty breathing, cough)      Dizzy cold sweat at work cough 11. PREGNANCY: "Is there any chance you are pregnant?" "When was your last menstrual period?"       N/A  Protocols used: CHEST PAIN-A-AH

## 2019-01-09 NOTE — Discharge Instructions (Signed)
You have been tested for COVID.  The result should come back in 1 to 2 days.

## 2019-01-09 NOTE — ED Triage Notes (Signed)
Fluttering and pain in chest at 1600 today. Denies chest pain at present.

## 2019-01-09 NOTE — Telephone Encounter (Signed)
Pt wanted to inform me that she was going to ER. She will go with her husband. She will go to [place]. Patient had previously been triaged for Chest tightness.  She states that the symptom is gone and she prefers to start care at [facility] point.  Reason for Disposition . General information question, no triage required and triager able to answer question  Answer Assessment - Initial Assessment Questions 1. REASON FOR CALL or QUESTION: "What is your reason for calling today?" or "How can I best help you?" or "What question do you have that I can help answer?"     Pt wanted to inform me that she was going to ER. She will go with her husband. She will go to [place].  Protocols used: INFORMATION ONLY CALL - NO TRIAGE-A-AH

## 2019-01-10 ENCOUNTER — Telehealth: Payer: Self-pay | Admitting: Family Medicine

## 2019-01-10 DIAGNOSIS — R300 Dysuria: Secondary | ICD-10-CM

## 2019-01-10 NOTE — Telephone Encounter (Signed)
°  Relation to pt: self Call back number: [phone]    Reason for call:  Patient requesting urine orders do to urine being "warm" and "lower abdominal pain" patient states she was seen in the ED 01/11/2019 due to chest tightness and patient states she forgot to remind ED staff she wanted her urine check, patient would like a follow up call today, please advise

## 2019-01-10 NOTE — Telephone Encounter (Signed)
Please order her a UA and culture for her urine symptoms, dysuria and abdominal pain. Can set up virtual visit if she needs to discuss chest pain or other concerns.

## 2019-01-10 NOTE — Telephone Encounter (Signed)
Please advise 

## 2019-01-11 LAB — NOVEL CORONAVIRUS, NAA (HOSP ORDER, SEND-OUT TO REF LAB; TAT 18-24 HRS): SARS-CoV-2, NAA: NOT DETECTED

## 2019-01-11 NOTE — Telephone Encounter (Signed)
Patient has appt on 01/13/19

## 2019-01-13 ENCOUNTER — Other Ambulatory Visit: Payer: Self-pay

## 2019-01-13 ENCOUNTER — Ambulatory Visit (INDEPENDENT_AMBULATORY_CARE_PROVIDER_SITE_OTHER): Payer: 59 | Admitting: Family Medicine

## 2019-01-13 DIAGNOSIS — R739 Hyperglycemia, unspecified: Secondary | ICD-10-CM

## 2019-01-13 DIAGNOSIS — R300 Dysuria: Secondary | ICD-10-CM | POA: Insufficient documentation

## 2019-01-13 DIAGNOSIS — F32A Depression, unspecified: Secondary | ICD-10-CM | POA: Insufficient documentation

## 2019-01-13 DIAGNOSIS — E785 Hyperlipidemia, unspecified: Secondary | ICD-10-CM | POA: Diagnosis not present

## 2019-01-13 DIAGNOSIS — J0100 Acute maxillary sinusitis, unspecified: Secondary | ICD-10-CM

## 2019-01-13 DIAGNOSIS — D649 Anemia, unspecified: Secondary | ICD-10-CM

## 2019-01-13 DIAGNOSIS — R0789 Other chest pain: Secondary | ICD-10-CM | POA: Diagnosis not present

## 2019-01-13 DIAGNOSIS — K219 Gastro-esophageal reflux disease without esophagitis: Secondary | ICD-10-CM

## 2019-01-13 DIAGNOSIS — F419 Anxiety disorder, unspecified: Secondary | ICD-10-CM

## 2019-01-13 MED ORDER — FAMOTIDINE 40 MG PO TABS: 40.0000 mg | ORAL_TABLET | Freq: Every evening | ORAL | 3 refills | Status: DC | PRN

## 2019-01-13 MED ORDER — DOXYCYCLINE HYCLATE 100 MG PO TABS: 100.0000 mg | ORAL_TABLET | Freq: Two times a day (BID) | ORAL | 0 refills | Status: DC

## 2019-01-13 NOTE — Assessment & Plan Note (Addendum)
Had been experiencing increasing reflux symptoms over the past few weeks and then she had an episode of chest pain that took her to the ER. Fortunately her work up was negative for a cardiac cause and she believes it was reflux and anxiety. We will start her on Famotidine qhs. Avoid offending foods, start probiotics. Do not eat large meals in late evening and consider raising head of bed. If anxiety worsens she will let us know

## 2019-01-13 NOTE — Assessment & Plan Note (Signed)
Increase leafy greens, consider increased lean red meat and using cast iron cookware. Continue to monitor, report any concerns 

## 2019-01-13 NOTE — Progress Notes (Signed)
Virtual Visit via phone Note  I connected with [name] on 01/13/19 at 11:00 AM EDT by phone enabled telemedicine application and verified that I am speaking with the correct person using two identifiers.  Location: Patient: work Provider: home   I discussed the limitations of evaluation and management by telemedicine and the availability of in person appointments. The patient expressed understanding and agreed to proceed. Princess Eulas Post CMA was able to get the patient set up on phone visit after being unable to set up video visit   Subjective:    Patient ID: [name], female    DOB: 09-06-1965, 53 y.o.   MRN: [id number]  No chief complaint on file.   HPI Patient is in today for follow-up on cough recent emergency room visit.  She was experiencing chest pain and went to the emergency room.  Her chest pain has resolved and her work-up was negative for cardiac condition.  She acknowledges she has been having worsening reflux prior to this episode of severe chest pain. She was feeling anxious and SOB so she presented to ER. She is feeling better now and is reassured that her pain was a combination of reflux and anxiety. She had a negative COVID test but is still having congestion, facial pressure and congestion. She also notes some dark urine and dysuria. No abdominal or chest pain today. Denies CP/palp/SOB/HA/fevers/GI c/o. Taking meds as prescribed  Past Medical History:  Diagnosis Date  . Allergic rhinitis   . Anemia   . Anxiety   . Asthma   . Contact dermatitis and eczema   . Diverticulitis    CT Scan  . Esophageal reflux   . H. pylori infection   . Hyperlipidemia, mixed   . IBS (irritable bowel syndrome)   . IC (interstitial cystitis)   . Internal hemorrhoids   . Lumbago   . Mitral valve disorders(424.0)   . Osteoarthritis 07/08/2015  . Post concussive syndrome   . Seasonal and perennial allergic rhinitis 01/20/2015    Past Surgical History:  Procedure  Laterality Date  . BARTHOLIN CYST MARSUPIALIZATION N/A 12/18/2013   Procedure: BARTHOLIN CYST MARSUPIALIZATION WITH BIOPSY;  Surgeon: Lovenia Kim, MD;  Location: [facility];  Service: Gynecology;  Laterality: N/A;  . CESAREAN SECTION    . DILITATION & CURRETTAGE/HYSTROSCOPY WITH NOVASURE ABLATION N/A 10/14/2012   Procedure: DILATATION & CURETTAGE/HYSTEROSCOPY WITH NOVASURE ABLATION;  Surgeon: Lovenia Kim, MD;  Location: [facility];  Service: Gynecology;  Laterality: N/A;  . MOUTH SURGERY    . SINUS SURGERY WITH INSTATRAK      Family History  Problem Relation Age of Onset  . Colon polyps Sister   . Mental illness Sister        anxiety from 9/11 survivors  . Varicose Veins Sister   . Diabetes Mother   . Hypertension Mother   . Heart attack Mother        MI at age 42  . Alcohol abuse Mother        smoker  . Heart disease Mother   . Dementia Mother   . Hypertension Father   . Hyperlipidemia Father   . Heart attack Father   . Cancer Father        lung cancer, smoker  . Cancer Brother        acute leukemia  . Dementia Maternal Grandmother   . Heart disease Maternal Grandfather        mi  . Cancer Paternal Grandmother  GYN cancer  . Other Sister        hypoglycemia  . Varicose Veins Sister   . Rheum arthritis Sister   . Pleurisy Sister   . Fibromyalgia Sister   . Mental retardation Sister        depression  . GER disease Son   . Alcohol abuse Other        Family history  . Colon cancer Neg Hx   . Breast cancer Neg Hx     Social History   Socioeconomic History  . Marital status: Married    Spouse name: Not on file  . Number of children: 2  . Years of education: Masters  . Highest education level: Not on file  Occupational History  . Occupation: [employer]  Social Needs  . Financial resource strain: Not on file  . Food insecurity    Worry: Not on file    Inability: Not on file  . Transportation needs    Medical: Not on file     Non-medical: Not on file  Tobacco Use  . Smoking status: Never Smoker  . Smokeless tobacco: Never Used  Substance and Sexual Activity  . Alcohol use: Yes    Comment: Holidays only  . Drug use: No  . 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 Physical activity    Days per week: Not on file    Minutes per session: Not on file  . Stress: Not on file  Relationships  . Social Herbalist on phone: Not on file    Gets together: Not on file    Attends religious service: Not on file    Active member of club or organization: Not on file    Attends meetings of clubs or organizations: Not on file    Relationship status: Not on file  . Intimate partner violence    Fear of current or ex partner: Not on file    Emotionally abused: Not on file    Physically abused: Not on file    Forced sexual activity: Not on file  Other Topics Concern  . Not on file  Social History Narrative   Lives at home home with husband and son.   Right-handed.   1 cup caffeine daily.    Outpatient Medications Prior to Visit  Medication Sig Dispense Refill  . albuterol (PROVENTIL) (2.5 MG/3ML) 0.083% nebulizer solution Take 3 mLs (2.5 mg total) by nebulization every 6 (six) hours as needed for wheezing or shortness of breath. 75 mL 0  . albuterol (VENTOLIN HFA) 108 (90 Base) MCG/ACT inhaler Inhale 2 puffs into the lungs every 6 (six) hours as needed for wheezing or shortness of breath. 1 Inhaler 6  . benzonatate (TESSALON) 200 MG capsule Take 1 capsule (200 mg total) by mouth 3 (three) times daily as needed for cough. 30 capsule 0  . fluticasone (FLONASE) 50 MCG/ACT nasal spray Place 2 sprays into both nostrils daily. 16 g 1  . hydrOXYzine (ATARAX/VISTARIL) 10 MG tablet Take 1/2 or 1 tablet every 8 hours as needed for anxiety 30 tablet 0  . Multiple Vitamins-Minerals (MULTIVITAMIN ADULT PO) Take 1 tablet by mouth daily.    .  mupirocin ointment (BACTROBAN) 2 % Apply 1 application topically 2 (two) times daily for 10 days. 30 g 0  . OVER THE COUNTER MEDICATION Calcium    .  Probiotic Product (PROBIOTIC DAILY PO) Take 1 tablet by mouth daily.     No facility-administered medications prior to visit.     Allergies  Allergen Reactions  . Amoxicillin Shortness Of Breath    Doesn't remember rxn  . Aspirin Shortness Of Breath    "irritated stomach" Ibuprofen and Naproxen do not agree with her either  . Codeine Hives and Shortness Of Breath    Does not take percocet or hydrocodone; tylenol only  . Latex Shortness Of Breath and Itching  . Other Shortness Of Breath    peanuts  . Sulfa Antibiotics Hives and Shortness Of Breath  . Sulfamethoxazole-Trimethoprim Shortness Of Breath  . Sulfonamide Derivatives Shortness Of Breath and Itching  . Sulphadimidine Sodium [Sulfamethazine Sodium] Shortness Of Breath    Added to food for preservative  . Ciprofloxacin Other (See Comments)    REACTION: SOB, "Tightness in head" REACTION: SOB, "Tightness in head" Syncope   . Azithromycin Other (See Comments)    Faintness  . Carisoprodol   . Clarithromycin     "Doesn't agree with me"  . Epinephrine     Heart racing  . Metronidazole Other (See Comments)    "cannot tolerate"  . Nsaids   . Penicillins     Doesn't remember reaction  . Tolmetin   . Omeprazole Palpitations    Review of Systems  Constitutional: Positive for malaise/fatigue. Negative for fever.  HENT: Positive for congestion and sinus pain.   Eyes: Negative for blurred vision.  Respiratory: Negative for shortness of breath.   Cardiovascular: Negative for chest pain, palpitations and leg swelling.  Gastrointestinal: Positive for heartburn. Negative for abdominal pain, blood in stool and nausea.  Genitourinary: Negative for dysuria and frequency.  Musculoskeletal: Negative for falls.  Skin: Negative for rash.  Neurological: Positive for headaches. Negative  for dizziness and loss of consciousness.  Endo/Heme/Allergies: Negative for environmental allergies.  Psychiatric/Behavioral: Negative for depression. The patient is nervous/anxious.        Objective:    Physical Exam unable to obtain via phone  There were no vitals taken for this visit. Wt Readings from Last 3 Encounters:  01/09/19 108 lb (49 kg)  11/02/18 109 lb 12.8 oz (49.8 kg)  09/09/18 107 lb 12.8 oz (48.9 kg)    Diabetic Foot Exam - Simple   No data filed     Lab Results  Component Value Date   WBC 4.6 01/09/2019   HGB 14.9 01/09/2019   HCT 43.0 01/09/2019   PLT 282 01/09/2019   GLUCOSE 93 01/09/2019   CHOL 205 (H) 12/28/2017   TRIG 176.0 (H) 12/28/2017   HDL 47.70 12/28/2017   LDLDIRECT 142.7 03/02/2012   LDLCALC 123 (H) 12/28/2017   ALT 10 06/15/2018   AST 15 06/15/2018   NA 139 01/09/2019   K 3.9 01/09/2019   CL 104 01/09/2019   CREATININE 0.66 01/09/2019   BUN 17 01/09/2019   CO2 25 01/09/2019   TSH 1.80 12/28/2017   HGBA1C 5.4 01/17/2018    Lab Results  Component Value Date   TSH 1.80 12/28/2017   Lab Results  Component Value Date   WBC 4.6 01/09/2019   HGB 14.9 01/09/2019   HCT 43.0 01/09/2019   MCV 96.0 01/09/2019   PLT 282 01/09/2019   Lab Results  Component Value Date   NA 139 01/09/2019   K 3.9 01/09/2019   CO2 25 01/09/2019   GLUCOSE 93 01/09/2019   BUN 17 01/09/2019   CREATININE 0.66 01/09/2019  BILITOT 0.5 06/15/2018   ALKPHOS 81 06/15/2018   AST 15 06/15/2018   ALT 10 06/15/2018   PROT 6.6 06/15/2018   ALBUMIN 4.2 06/15/2018   CALCIUM 9.7 01/09/2019   ANIONGAP 10 01/09/2019   GFR 84.71 12/28/2017   Lab Results  Component Value Date   CHOL 205 (H) 12/28/2017   Lab Results  Component Value Date   HDL 47.70 12/28/2017   Lab Results  Component Value Date   LDLCALC 123 (H) 12/28/2017   Lab Results  Component Value Date   TRIG 176.0 (H) 12/28/2017   Lab Results  Component Value Date   CHOLHDL 4 12/28/2017    Lab Results  Component Value Date   HGBA1C 5.4 01/17/2018       Assessment & Plan:   Problem List Items Addressed This Visit    ESOPHAGEAL REFLUX    Had been experiencing increasing reflux symptoms over the past few weeks and then she had an episode of chest pain that took her to the ER. Fortunately her work up was negative for a cardiac cause and she believes it was reflux and anxiety. We will start her on Famotidine qhs. Avoid offending foods, start probiotics. Do not eat large meals in late evening and consider raising head of bed. If anxiety worsens she will let us know      Relevant Medications   famotidine (PEPCID) 40 MG tablet   Anemia    Increase leafy greens, consider increased lean red meat and using cast iron cookware. Continue to monitor, report any concerns      Relevant Orders   CBC   Sinusitis    covid testing in ER negative this week but congestion, malaise persists. Will allow a course of Doxycycline 100 mg po bid x 10 days.       Relevant Medications   doxycycline (VIBRA-TABS) 100 MG tablet   Chest pressure   Relevant Orders   TSH   Hyperlipidemia, mild - Primary    Encouraged heart healthy diet, increase exercise, avoid trans fats, consider a krill oil cap daily      Relevant Orders   Lipid panel   Dysuria    Check UA and culture, labs ordered at [facility] lab for today      Relevant Orders   Urinalysis   Urine Culture   Anxiety    Her son has recently recovered from [place] and she acknowledges she has been very stressed and believes that has played a role in her chest pain. She does not feel she needs a medicine for anxiety yet but will let us know if symptoms get worse.        Other Visit Diagnoses    Hyperglycemia       Relevant Orders   Hemoglobin A1c   Comprehensive metabolic panel   TSH      I am having Anaiya Meckes start on famotidine and doxycycline. I am also having her maintain her Probiotic Product (PROBIOTIC DAILY PO), OVER THE  COUNTER MEDICATION, Multiple Vitamins-Minerals (MULTIVITAMIN ADULT PO), fluticasone, hydrOXYzine, albuterol, albuterol, benzonatate, and mupirocin ointment.  Meds ordered this encounter  Medications  . famotidine (PEPCID) 40 MG tablet    Sig: Take 1 tablet (40 mg total) by mouth at bedtime as needed for heartburn or indigestion.    Dispense:  30 tablet    Refill:  3  . doxycycline (VIBRA-TABS) 100 MG tablet    Sig: Take 1 tablet (100 mg total) by mouth 2 (two) times daily.  Dispense:  20 tablet    Refill:  0     I discussed the assessment and treatment plan with the patient. The patient was provided an opportunity to ask questions and all were answered. The patient agreed with the plan and demonstrated an understanding of the instructions.   The patient was advised to call back or seek an in-person evaluation if the symptoms worsen or if the condition fails to improve as anticipated.  I provided 25 minutes of non-face-to-face time during this encounter.   Penni Homans, MD

## 2019-01-13 NOTE — Assessment & Plan Note (Signed)
Her son has recently recovered from [place] and she acknowledges she has been very stressed and believes that has played a role in her chest pain. She does not feel she needs a medicine for anxiety yet but will let us know if symptoms get worse.

## 2019-01-13 NOTE — Assessment & Plan Note (Signed)
Encouraged heart healthy diet, increase exercise, avoid trans fats, consider a krill oil cap daily 

## 2019-01-13 NOTE — Assessment & Plan Note (Signed)
Check UA and culture, labs ordered at [facility] lab for today

## 2019-01-13 NOTE — Assessment & Plan Note (Signed)
covid testing in ER negative this week but congestion, malaise persists. Will allow a course of Doxycycline 100 mg po bid x 10 days.

## 2019-01-17 ENCOUNTER — Other Ambulatory Visit (INDEPENDENT_AMBULATORY_CARE_PROVIDER_SITE_OTHER): Payer: 59

## 2019-01-17 ENCOUNTER — Other Ambulatory Visit: Payer: Self-pay

## 2019-01-17 DIAGNOSIS — R300 Dysuria: Secondary | ICD-10-CM | POA: Diagnosis not present

## 2019-01-17 DIAGNOSIS — R0789 Other chest pain: Secondary | ICD-10-CM

## 2019-01-17 DIAGNOSIS — E785 Hyperlipidemia, unspecified: Secondary | ICD-10-CM | POA: Diagnosis not present

## 2019-01-17 DIAGNOSIS — R739 Hyperglycemia, unspecified: Secondary | ICD-10-CM

## 2019-01-17 DIAGNOSIS — D649 Anemia, unspecified: Secondary | ICD-10-CM

## 2019-01-18 LAB — HEMOGLOBIN A1C: Hgb A1c MFr Bld: 5.3 % (ref 4.6–6.5)

## 2019-01-18 LAB — URINALYSIS, ROUTINE W REFLEX MICROSCOPIC
Bilirubin Urine: NEGATIVE
Ketones, ur: NEGATIVE
Leukocytes,Ua: NEGATIVE
Nitrite: NEGATIVE
Specific Gravity, Urine: <=1.005 — AB (ref 1.000–1.030)
Total Protein, Urine: NEGATIVE
Urine Glucose: NEGATIVE
Urobilinogen, UA: 0.2 (ref 0.0–1.0)
WBC, UA: NONE SEEN (ref 0–?)
pH: 6 (ref 5.0–8.0)

## 2019-01-18 LAB — CBC
HCT: 38.8 % (ref 36.0–46.0)
Hemoglobin: 13.7 g/dL (ref 12.0–15.0)
MCHC: 35.4 g/dL (ref 30.0–36.0)
MCV: 98.1 fl (ref 78.0–100.0)
Platelets: 263 10*3/uL (ref 150.0–400.0)
RBC: 3.96 Mil/uL (ref 3.87–5.11)
RDW: 11.7 % (ref 11.5–15.5)
WBC: 4.6 10*3/uL (ref 4.0–10.5)

## 2019-01-18 LAB — LIPID PANEL
Cholesterol: 206 mg/dL — ABNORMAL HIGH (ref 0–200)
HDL: 37.3 mg/dL — ABNORMAL LOW
Total CHOL/HDL Ratio: 6
Triglycerides: 422 mg/dL — ABNORMAL HIGH (ref 0.0–149.0)

## 2019-01-18 LAB — COMPREHENSIVE METABOLIC PANEL WITH GFR
ALT: 10 U/L (ref 0–35)
AST: 16 U/L (ref 0–37)
Albumin: 4.2 g/dL (ref 3.5–5.2)
Alkaline Phosphatase: 94 U/L (ref 39–117)
BUN: 18 mg/dL (ref 6–23)
CO2: 28 meq/L (ref 19–32)
Calcium: 9.3 mg/dL (ref 8.4–10.5)
Chloride: 99 meq/L (ref 96–112)
Creatinine, Ser: 0.56 mg/dL (ref 0.40–1.20)
GFR: 112.92 mL/min
Glucose, Bld: 117 mg/dL — ABNORMAL HIGH (ref 70–99)
Potassium: 4 meq/L (ref 3.5–5.1)
Sodium: 136 meq/L (ref 135–145)
Total Bilirubin: 0.6 mg/dL (ref 0.2–1.2)
Total Protein: 6.7 g/dL (ref 6.0–8.3)

## 2019-01-18 LAB — TSH: TSH: 1.07 u[IU]/mL (ref 0.35–4.50)

## 2019-01-18 LAB — LDL CHOLESTEROL, DIRECT: Direct LDL: 143 mg/dL

## 2019-01-19 ENCOUNTER — Telehealth: Payer: Self-pay | Admitting: *Deleted

## 2019-01-19 LAB — URINE CULTURE
MICRO NUMBER:: 908703
Result:: NO GROWTH
SPECIMEN QUALITY:: ADEQUATE

## 2019-01-19 NOTE — Telephone Encounter (Signed)
Copied from [place] [id number]. Topic: General - Inquiry >> Jan 19, 2019  8:08 AM Richardo Priest, NT wrote: Reason for CRM: Patient called in stating she would like to speak with PCP's CMA in regards to labs that she had done recently. Please advise as patient is wanting advice on medication she should start taking for making changes in regards to sugars and cholesterol. Patient is also wondering if the antibiotic for the blood in her urine. Patient would like to get a call back today.

## 2019-01-20 NOTE — Telephone Encounter (Signed)
Called patient left message for patient to call the office back  Nurse triage may handle 

## 2019-01-23 MED ORDER — FENOFIBRATE 145 MG PO TABS: 145.0000 mg | ORAL_TABLET | Freq: Every day | ORAL | 3 refills | Status: DC

## 2019-01-30 ENCOUNTER — Ambulatory Visit: Payer: Self-pay | Admitting: *Deleted

## 2019-01-30 DIAGNOSIS — E785 Hyperlipidemia, unspecified: Secondary | ICD-10-CM

## 2019-01-30 NOTE — Telephone Encounter (Signed)
She can stop the fenofibrate and please put it in her allergy list. She should minimize simple carbohydrates and fatty foods and recheck labs in 3 months

## 2019-01-30 NOTE — Telephone Encounter (Signed)
Patient began taking Fenofibrate 145 mg tabs on Friday, 01/27/19. On day one she had intermittent leg cramps, mild dizziness and fatigue. Fatigue has improved. Leg cramps and feeling like her arms and legs are very heavy have continued. She is concerned her dosage may be too high or should she be switched to a different medication for her triglycerides? Instructed patient to stop taking this medication until she hears back.  Routing to provider for further advice regarding medication. Pharmacy on file.  Answer Assessment - Initial Assessment Questions 1. DESCRIPTION: "Describe your dizziness."    Just dizzy 2. LIGHTHEADED: "Do you feel lightheaded?" (e.g., somewhat faint, woozy, weak upon standing)     sporatic 3. VERTIGO: "Do you feel like either you or the room is spinning or tilting?" (i.e. vertigo)     Some movement "involved" 4. SEVERITY: "How bad is it?"  "Do you feel like you are going to faint?" "Can you stand and walk?"   - MILD - walking normally   - MODERATE - interferes with normal activities (e.g., work, school)    - SEVERE - unable to stand, requires support to walk, feels like passing out now.      mild 5. ONSET:  "When did the dizziness begin?"     As soon as she started taking Tricor 6. AGGRAVATING FACTORS: "Does anything make it worse?" (e.g., standing, change in head position)     no 7. HEART RATE: "Can you tell me your heart rate?" "How many beats in 15 seconds?"  (Note: not all patients can do this)       no 8. CAUSE: "What do you think is causing the dizziness?"     TricorENT SYMPTOM: "Have you had dizziness before?" If so, ask: "When was the last time?" "What happened that time?"     no 10. OTHER SYMPTOMS: "Do you have any other symptoms?" (e.g., fever, chest pain, vomiting, diarrhea, bleeding)      Fatigue and intermittent leg cramps 11. PREGNANCY: "Is there any chance you are pregnant?" "When was your last menstrual period?"       no  Answer Assessment - Initial  Assessment Questions 1.   NAME of MEDICATION: "What medicine are you calling about?"     Fenofibrate  2.   QUESTION: "What is your question?"     Could I be on too high of a dosage. 3.   PRESCRIBING HCP: "Who prescribed it?" Reason: if prescribed by specialist, call should be referred to that group.     Dr.Blyth 4. SYMPTOMS: "Do you have any symptoms?"     Fatigue, leg aches, mild dizziness. 5. SEVERITY: If symptoms are present, ask "Are they mild, moderate or severe?"     mild 6.  PREGNANCY:  "Is there any chance that you are pregnant?" "When was your last menstrual period?"     na  Protocols used: DIZZINESS - LIGHTHEADEDNESS-A-AH, MEDICATION QUESTION CALL-A-AH

## 2019-01-31 NOTE — Telephone Encounter (Signed)
Left detailed message for patient to schedule a lab appt. I have placed the lab orders for 3 months from now

## 2019-01-31 NOTE — Addendum Note (Signed)
Addended by: [name] A on: 01/31/2019 07:22 AM   Modules accepted: Orders

## 2019-02-16 ENCOUNTER — Telehealth: Payer: Self-pay

## 2019-02-16 NOTE — Telephone Encounter (Signed)
This was sent to [place] in error. I re-routed the message to her PCP.

## 2019-02-16 NOTE — Telephone Encounter (Signed)
Not sure what this is about? Is there something new to address or is this secondary to the calls a couple of weeks ago. Check with patient and see what she needs.

## 2019-02-17 ENCOUNTER — Ambulatory Visit (INDEPENDENT_AMBULATORY_CARE_PROVIDER_SITE_OTHER): Payer: 59 | Admitting: Family Medicine

## 2019-02-17 ENCOUNTER — Other Ambulatory Visit: Payer: Self-pay

## 2019-02-17 DIAGNOSIS — Z20828 Contact with and (suspected) exposure to other viral communicable diseases: Secondary | ICD-10-CM

## 2019-02-17 DIAGNOSIS — G4700 Insomnia, unspecified: Secondary | ICD-10-CM

## 2019-02-17 DIAGNOSIS — Z20822 Contact with and (suspected) exposure to covid-19: Secondary | ICD-10-CM

## 2019-02-17 DIAGNOSIS — E785 Hyperlipidemia, unspecified: Secondary | ICD-10-CM

## 2019-02-17 DIAGNOSIS — R0981 Nasal congestion: Secondary | ICD-10-CM | POA: Diagnosis not present

## 2019-02-17 DIAGNOSIS — F419 Anxiety disorder, unspecified: Secondary | ICD-10-CM

## 2019-02-17 DIAGNOSIS — J4520 Mild intermittent asthma, uncomplicated: Secondary | ICD-10-CM

## 2019-02-17 DIAGNOSIS — R0789 Other chest pain: Secondary | ICD-10-CM

## 2019-02-17 DIAGNOSIS — R300 Dysuria: Secondary | ICD-10-CM

## 2019-02-17 DIAGNOSIS — R739 Hyperglycemia, unspecified: Secondary | ICD-10-CM

## 2019-02-17 MED ORDER — FENOFIBRATE MICRONIZED 30 MG PO CAPS: 30.0000 mg | ORAL_CAPSULE | Freq: Every day | ORAL | 3 refills | Status: DC

## 2019-02-19 DIAGNOSIS — G4700 Insomnia, unspecified: Secondary | ICD-10-CM | POA: Insufficient documentation

## 2019-02-19 NOTE — Assessment & Plan Note (Signed)
Likely related to stress. Encouraged good sleep hygiene such as dark, quiet room. No blue/green glowing lights such as computer screens in bedroom. No alcohol or stimulants in evening. Cut down on caffeine as able. Regular exercise is helpful but not just prior to bed time. Consider Melatonin or L Tryptophan

## 2019-02-19 NOTE — Assessment & Plan Note (Signed)
Continues to struggle with stress but over all feels she is managing. No changes.

## 2019-02-19 NOTE — Progress Notes (Signed)
Virtual Visit via Video Note  I connected with [name] on 02/19/19 at  9:40 AM EDT by a video enabled telemedicine application and verified that I am speaking with the correct person using two identifiers.  Location: Patient: home Provider: home   I discussed the limitations of evaluation and management by telemedicine and the availability of in person appointments. The patient expressed understanding and agreed to proceed. [name] CMA was able to get her set up on aphone visit after being unable to make a video visit work.    Subjective:    Patient ID: [name], female    DOB: 05-19-1965, 53 y.o.   MRN: [id number]  No chief complaint on file.   HPI Patient is in today for follow up on chronic medical concerns including anxiety, asthma, hyperlipidemia and more. No recent febrile illness or recent hospitalizations. She is having trouble with anxiety and insomnia. Has trouble settling down. Tried to take a fenofibrate but she had myalgias, and some light headedness. She stopped it and the myalgias resolved and her dizziness improved although it is still slightly present. Denies CP/palp/SOB/HA/congestion/fevers/GI or GU c/o. Taking meds as prescribed  Past Medical History:  Diagnosis Date  . Allergic rhinitis   . Anemia   . Anxiety   . Asthma   . Contact dermatitis and eczema   . Diverticulitis    CT Scan  . Esophageal reflux   . H. pylori infection   . Hyperlipidemia, mixed   . IBS (irritable bowel syndrome)   . IC (interstitial cystitis)   . Internal hemorrhoids   . Lumbago   . Mitral valve disorders(424.0)   . Osteoarthritis 07/08/2015  . Garcia concussive syndrome   . Seasonal and perennial allergic rhinitis 01/20/2015    Past Surgical History:  Procedure Laterality Date  . BARTHOLIN CYST MARSUPIALIZATION N/A 12/18/2013   Procedure: BARTHOLIN CYST MARSUPIALIZATION WITH BIOPSY;  Surgeon: Lovenia Kim, MD;  Location: [facility];  Service: Gynecology;   Laterality: N/A;  . CESAREAN SECTION    . DILITATION & CURRETTAGE/HYSTROSCOPY WITH NOVASURE ABLATION N/A 10/14/2012   Procedure: DILATATION & CURETTAGE/HYSTEROSCOPY WITH NOVASURE ABLATION;  Surgeon: Lovenia Kim, MD;  Location: [facility];  Service: Gynecology;  Laterality: N/A;  . MOUTH SURGERY    . SINUS SURGERY WITH INSTATRAK      Family History  Problem Relation Age of Onset  . Colon polyps Sister   . Mental illness Sister        anxiety from 9/11 survivors  . Varicose Veins Sister   . Diabetes Mother   . Hypertension Mother   . Heart attack Mother        MI at age 49  . Alcohol abuse Mother        smoker  . Heart disease Mother   . Dementia Mother   . Hypertension Father   . Hyperlipidemia Father   . Heart attack Father   . Cancer Father        lung cancer, smoker  . Cancer Brother        acute leukemia  . Dementia Maternal Grandmother   . Heart disease Maternal Grandfather        mi  . Cancer Paternal Grandmother        GYN cancer  . Other Sister        hypoglycemia  . Varicose Veins Sister   . Rheum arthritis Sister   . Pleurisy Sister   . Fibromyalgia Sister   . Mental  retardation Sister        depression  . GER disease Son   . Alcohol abuse Other        Family history  . Colon cancer Neg Hx   . Breast cancer Neg Hx     Social History   Socioeconomic History  . Marital status: Married    Spouse name: Not on file  . Number of children: 2  . Years of education: Masters  . Highest education level: Not on file  Occupational History  . Occupation: [employer]  Social Needs  . Financial resource strain: Not on file  . Food insecurity    Worry: Not on file    Inability: Not on file  . Transportation needs    Medical: Not on file    Non-medical: Not on file  Tobacco Use  . Smoking status: Never Smoker  . Smokeless tobacco: Never Used  Substance and Sexual Activity  . Alcohol use: Yes    Comment: Holidays only  . Drug use: No   . 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 Physical activity    Days per week: Not on file    Minutes per session: Not on file  . Stress: Not on file  Relationships  . Social Herbalist on phone: Not on file    Gets together: Not on file    Attends religious service: Not on file    Active member of club or organization: Not on file    Attends meetings of clubs or organizations: Not on file    Relationship status: Not on file  . Intimate partner violence    Fear of current or ex partner: Not on file    Emotionally abused: Not on file    Physically abused: Not on file    Forced sexual activity: Not on file  Other Topics Concern  . Not on file  Social History Narrative   Lives at home home with husband and son.   Right-handed.   1 cup caffeine daily.    Outpatient Medications Prior to Visit  Medication Sig Dispense Refill  . albuterol (PROVENTIL) (2.5 MG/3ML) 0.083% nebulizer solution Take 3 mLs (2.5 mg total) by nebulization every 6 (six) hours as needed for wheezing or shortness of breath. 75 mL 0  . albuterol (VENTOLIN HFA) 108 (90 Base) MCG/ACT inhaler Inhale 2 puffs into the lungs every 6 (six) hours as needed for wheezing or shortness of breath. 1 Inhaler 6  . benzonatate (TESSALON) 200 MG capsule Take 1 capsule (200 mg total) by mouth 3 (three) times daily as needed for cough. 30 capsule 0  . doxycycline (VIBRA-TABS) 100 MG tablet Take 1 tablet (100 mg total) by mouth 2 (two) times daily. 20 tablet 0  . famotidine (PEPCID) 40 MG tablet Take 1 tablet (40 mg total) by mouth at bedtime as needed for heartburn or indigestion. 30 tablet 3  . fenofibrate (TRICOR) 145 MG tablet Take 1 tablet (145 mg total) by mouth daily. 30 tablet 3  . fluticasone (FLONASE) 50 MCG/ACT nasal spray Place 2 sprays into both nostrils daily. 16 g 1  . hydrOXYzine (ATARAX/VISTARIL) 10 MG tablet Take 1/2 or 1  tablet every 8 hours as needed for anxiety 30 tablet 0  . Multiple Vitamins-Minerals (MULTIVITAMIN ADULT PO) Take 1 tablet by mouth daily.    Marland Kitchen OVER THE  COUNTER MEDICATION Calcium    . Probiotic Product (PROBIOTIC DAILY PO) Take 1 tablet by mouth daily.     No facility-administered medications prior to visit.     Allergies  Allergen Reactions  . Amoxicillin Shortness Of Breath    Doesn't remember rxn  . Aspirin Shortness Of Breath    "irritated stomach" Ibuprofen and Naproxen do not agree with her either  . Codeine Hives and Shortness Of Breath    Does not take percocet or hydrocodone; tylenol only  . Latex Shortness Of Breath and Itching  . Other Shortness Of Breath    peanuts  . Sulfa Antibiotics Hives and Shortness Of Breath  . Sulfamethoxazole-Trimethoprim Shortness Of Breath  . Sulfonamide Derivatives Shortness Of Breath and Itching  . Sulphadimidine Sodium [Sulfamethazine Sodium] Shortness Of Breath    Added to food for preservative  . Ciprofloxacin Other (See Comments)    REACTION: SOB, "Tightness in head" REACTION: SOB, "Tightness in head" Syncope   . Azithromycin Other (See Comments)    Faintness  . Carisoprodol   . Clarithromycin     "Doesn't agree with me"  . Epinephrine     Heart racing  . Fenofibrate Other (See Comments)    Leg cramps, body aches   . Metronidazole Other (See Comments)    "cannot tolerate"  . Nsaids   . Penicillins     Doesn't remember reaction  . Tolmetin   . Omeprazole Palpitations    Review of Systems  Constitutional: Positive for malaise/fatigue. Negative for fever.  HENT: Negative for congestion.   Eyes: Negative for blurred vision.  Respiratory: Negative for shortness of breath.   Cardiovascular: Negative for chest pain, palpitations and leg swelling.  Gastrointestinal: Negative for abdominal pain, blood in stool and nausea.  Genitourinary: Negative for dysuria and frequency.  Musculoskeletal: Negative for falls.  Skin:  Negative for rash.  Neurological: Positive for dizziness. Negative for loss of consciousness and headaches.  Endo/Heme/Allergies: Negative for environmental allergies.  Psychiatric/Behavioral: Negative for depression. The patient is nervous/anxious and has insomnia.        Objective:    Physical Exam unable to obtain via phone.  There were no vitals taken for this visit. Wt Readings from Last 3 Encounters:  01/09/19 108 lb (49 kg)  11/02/18 109 lb 12.8 oz (49.8 kg)  09/09/18 107 lb 12.8 oz (48.9 kg)    Diabetic Foot Exam - Simple   No data filed     Lab Results  Component Value Date   WBC 4.6 01/17/2019   HGB 13.7 01/17/2019   HCT 38.8 01/17/2019   PLT 263.0 01/17/2019   GLUCOSE 117 (H) 01/17/2019   CHOL 206 (H) 01/17/2019   TRIG (H) 01/17/2019    422.0 Triglyceride is over 400; calculations on Lipids are invalid.   HDL 37.30 (L) 01/17/2019   LDLDIRECT 143.0 01/17/2019   LDLCALC 123 (H) 12/28/2017   ALT 10 01/17/2019   AST 16 01/17/2019   NA 136 01/17/2019   K 4.0 01/17/2019   CL 99 01/17/2019   CREATININE 0.56 01/17/2019   BUN 18 01/17/2019   CO2 28 01/17/2019   TSH 1.07 01/17/2019   HGBA1C 5.3 01/17/2019    Lab Results  Component Value Date   TSH 1.07 01/17/2019   Lab Results  Component Value Date   WBC 4.6 01/17/2019   HGB 13.7 01/17/2019   HCT 38.8 01/17/2019   MCV 98.1 01/17/2019   PLT 263.0 01/17/2019   Lab Results  Component Value  Date   NA 136 01/17/2019   K 4.0 01/17/2019   CO2 28 01/17/2019   GLUCOSE 117 (H) 01/17/2019   BUN 18 01/17/2019   CREATININE 0.56 01/17/2019   BILITOT 0.6 01/17/2019   ALKPHOS 94 01/17/2019   AST 16 01/17/2019   ALT 10 01/17/2019   PROT 6.7 01/17/2019   ALBUMIN 4.2 01/17/2019   CALCIUM 9.3 01/17/2019   ANIONGAP 10 01/09/2019   GFR 112.92 01/17/2019   Lab Results  Component Value Date   CHOL 206 (H) 01/17/2019   Lab Results  Component Value Date   HDL 37.30 (L) 01/17/2019   Lab Results  Component  Value Date   LDLCALC 123 (H) 12/28/2017   Lab Results  Component Value Date   TRIG (H) 01/17/2019    422.0 Triglyceride is over 400; calculations on Lipids are invalid.   Lab Results  Component Value Date   CHOLHDL 6 01/17/2019   Lab Results  Component Value Date   HGBA1C 5.3 01/17/2019       Assessment & Plan:   Problem List Items Addressed This Visit    Allergic asthma, mild intermittent, uncomplicated    No recent flares      Chest pressure   Hyperlipidemia, mild    Encouraged heart healthy diet, increase exercise, avoid trans fats, tolerating fish oil but did not tolerate Fenofibrate 145 mg daily is willing to try a lower dose will try prescribinb 30 mg daily and see if her insurance will cover it. She will stop if symptoms return.      Relevant Medications   Fenofibrate Micronized 30 MG CAPS   Other Relevant Orders   Lipid panel   Dysuria   Relevant Orders   Urinalysis   Urine Culture   Anxiety    Continues to struggle with stress but over all feels she is managing. No changes.       Insomnia    Likely related to stress. Encouraged good sleep hygiene such as dark, quiet room. No blue/green glowing lights such as computer screens in bedroom. No alcohol or stimulants in evening. Cut down on caffeine as able. Regular exercise is helpful but not just prior to bed time. Consider Melatonin or L Tryptophan       Other Visit Diagnoses    Exposure to COVID-19 virus    -  Primary   Relevant Orders   Novel Coronavirus, NAA ([facility])   Congestion of nasal sinus       Relevant Orders   Novel Coronavirus, NAA ([facility])   Hyperglycemia       Relevant Orders   CBC   Comprehensive metabolic panel   TSH      I am having [name] start on Fenofibrate Micronized. I am also having her maintain her Probiotic Product (PROBIOTIC DAILY PO), OVER THE COUNTER MEDICATION, Multiple Vitamins-Minerals (MULTIVITAMIN ADULT PO), fluticasone, hydrOXYzine, albuterol, albuterol,  benzonatate, famotidine, doxycycline, and fenofibrate.  Meds ordered this encounter  Medications  . Fenofibrate Micronized 30 MG CAPS    Sig: Take 30 mg by mouth daily.    Dispense:  30 capsule    Refill:  3     I discussed the assessment and treatment plan with the patient. The patient was provided an opportunity to ask questions and all were answered. The patient agreed with the plan and demonstrated an understanding of the instructions.   The patient was advised to call back or seek an in-person evaluation if the symptoms worsen or if the condition  fails to improve as anticipated.  I provided 25 minutes of non-face-to-face time during this encounter.   Penni Homans, MD

## 2019-02-19 NOTE — Assessment & Plan Note (Signed)
No recent flares 

## 2019-02-19 NOTE — Assessment & Plan Note (Addendum)
Encouraged heart healthy diet, increase exercise, avoid trans fats, tolerating fish oil but did not tolerate Fenofibrate 145 mg daily is willing to try a lower dose will try prescribinb 30 mg daily and see if her insurance will cover it. She will stop if symptoms return.

## 2019-02-20 ENCOUNTER — Telehealth: Payer: Self-pay | Admitting: Family Medicine

## 2019-02-20 NOTE — Telephone Encounter (Signed)
Copied from [place] [id number]. Topic: General - Other >> Feb 20, 2019  8:28 AM Keene Breath wrote: Reason for CRM: Patient called to inform the nurse or doctor that she needs the higher dose cholesterol medication in order for the insurance to cover it.  Please advise and send to the pharmacy.  CB# [phone]

## 2019-02-20 NOTE — Telephone Encounter (Signed)
Pharmacy sent over fax stating that Antara 30mg  is not covered by insurance and would like to change to the generic fenofibrate 45mg ?

## 2019-02-20 NOTE — Telephone Encounter (Signed)
Yes please d/c the 30 mg and send in the Fenofibrate 45 mg tab, 1 tab po qd disp #30 with 3 rf

## 2019-02-21 ENCOUNTER — Ambulatory Visit: Payer: 59 | Admitting: Family Medicine

## 2019-02-21 MED ORDER — CHOLINE FENOFIBRATE 45 MG PO CPDR: 45.0000 mg | DELAYED_RELEASE_CAPSULE | Freq: Every day | ORAL | 3 refills | Status: DC

## 2019-02-21 NOTE — Telephone Encounter (Signed)
rx sent in 

## 2019-02-24 ENCOUNTER — Other Ambulatory Visit: Payer: Self-pay | Admitting: Obstetrics and Gynecology

## 2019-02-24 DIAGNOSIS — Z1231 Encounter for screening mammogram for malignant neoplasm of breast: Secondary | ICD-10-CM

## 2019-04-11 ENCOUNTER — Telehealth: Payer: Self-pay

## 2019-04-11 MED ORDER — FENOFIBRATE MICRONIZED 30 MG PO CAPS: 30.0000 mg | ORAL_CAPSULE | Freq: Every day | ORAL | 0 refills | Status: DC

## 2019-04-11 NOTE — Telephone Encounter (Signed)
Copied from [place] [id number]. Topic: General - Other >> Apr 10, 2019  5:10 PM Wynetta Emery, [place] C wrote: Reason for CRM: pt called in to discuss  Cholesterol medication, pt says that provider prescribed (pt isn't sure of the name of medication) 30 MG, pt says that [facility] pharmacy didn't have medication so provider changed to Rx fish oil. Pt would like to know if provider could send medication (30MG ) to [facility] instead? Pt says that she was suggested to send to a different pharmacy by pharmacy.   Kristopher Oppenheim on [address]   Please assist pt directly.

## 2019-04-11 NOTE — Addendum Note (Signed)
Addended by: [name] A on: 04/11/2019 03:25 PM   Modules accepted: Orders

## 2019-04-11 NOTE — Telephone Encounter (Signed)
Sent fenofibrate to patients pharmacy

## 2019-04-17 ENCOUNTER — Ambulatory Visit
Admission: RE | Admit: 2019-04-17 | Discharge: 2019-04-17 | Disposition: A | Discharge status: Home / Self Care | Payer: 59 | Source: Ambulatory Visit | Attending: Obstetrics and Gynecology | Admitting: Obstetrics and Gynecology

## 2019-04-17 ENCOUNTER — Other Ambulatory Visit: Payer: Self-pay

## 2019-04-17 DIAGNOSIS — Z1231 Encounter for screening mammogram for malignant neoplasm of breast: Secondary | ICD-10-CM

## 2019-04-17 MED ORDER — CHOLINE FENOFIBRATE 45 MG PO CPDR: 45.0000 mg | DELAYED_RELEASE_CAPSULE | Freq: Every day | ORAL | 3 refills | Status: DC

## 2019-04-26 ENCOUNTER — Ambulatory Visit: Payer: 59 | Attending: Internal Medicine

## 2019-04-26 DIAGNOSIS — Z20822 Contact with and (suspected) exposure to covid-19: Secondary | ICD-10-CM

## 2019-04-27 ENCOUNTER — Telehealth: Payer: Self-pay | Admitting: *Deleted

## 2019-04-27 LAB — NOVEL CORONAVIRUS, NAA: SARS-CoV-2, NAA: NOT DETECTED

## 2019-04-27 NOTE — Telephone Encounter (Signed)
Copied from [place] [id number]. Topic: General - Inquiry >> Apr 27, 2019 11:01 AM Mathis Bud wrote: Reason for CRM: Patient is requesting a medication for her sinuses.  Patient is pending covid test results. Patient is experiencing headaches and drainage.    [facility] [address] - [place], [place] - [address]  Phone:  [phone] Fax:  [phone]   Call back [phone]

## 2019-04-27 NOTE — Telephone Encounter (Signed)
Attempted to reach pt. Left detailed message that offices are closed this afternoon and Friday and she will need to be seen to determine if a prescription can be given. Advised visit options may be E-visit thru mychart and any [place] urgent care center. Gave locations and advised pt to contact urgent care that may be closest to her.

## 2019-06-20 ENCOUNTER — Ambulatory Visit (INDEPENDENT_AMBULATORY_CARE_PROVIDER_SITE_OTHER): Payer: 59 | Admitting: Family Medicine

## 2019-06-20 ENCOUNTER — Other Ambulatory Visit: Payer: Self-pay

## 2019-06-20 VITALS — Ht 59.0 in | Wt 105.0 lb

## 2019-06-20 DIAGNOSIS — J0100 Acute maxillary sinusitis, unspecified: Secondary | ICD-10-CM

## 2019-06-20 DIAGNOSIS — D649 Anemia, unspecified: Secondary | ICD-10-CM | POA: Diagnosis not present

## 2019-06-20 DIAGNOSIS — Z7189 Other specified counseling: Secondary | ICD-10-CM | POA: Diagnosis not present

## 2019-06-20 DIAGNOSIS — R739 Hyperglycemia, unspecified: Secondary | ICD-10-CM | POA: Diagnosis not present

## 2019-06-20 DIAGNOSIS — K589 Irritable bowel syndrome without diarrhea: Secondary | ICD-10-CM

## 2019-06-20 DIAGNOSIS — E785 Hyperlipidemia, unspecified: Secondary | ICD-10-CM | POA: Diagnosis not present

## 2019-06-20 MED ORDER — DOXYCYCLINE HYCLATE 100 MG PO TABS: 100.0000 mg | ORAL_TABLET | Freq: Two times a day (BID) | ORAL | 0 refills | Status: DC

## 2019-06-21 DIAGNOSIS — Z7189 Other specified counseling: Secondary | ICD-10-CM | POA: Insufficient documentation

## 2019-06-21 NOTE — Assessment & Plan Note (Signed)
Encouraged increased rest and hydration, add probiotics, zinc such as Coldeze or Xicam. Treat fevers as needed. If her symptoms worsens she has a prescription for doxycycline to use if her symptoms worsen.

## 2019-06-21 NOTE — Assessment & Plan Note (Addendum)
Encouraged heart healthy diet, increase exercise, avoid trans fats, she has taken fish oil and Metamucil recently. She will continue

## 2019-06-21 NOTE — Assessment & Plan Note (Signed)
Avoid offending foods, start probiotics. Do not eat large meals in late evening and consider raising head of bed. Continue fiber supplements.

## 2019-06-21 NOTE — Assessment & Plan Note (Signed)
Omron Blood Pressure cuff, upper arm, want BP 100-140/60-90 Pulse oximeter, want oxygen in 90s  Weekly vitals  Take Multivitamin with minerals, selenium Vitamin D 1000-2000 IU daily Probiotic with lactobacillus and bifidophilus Asprin EC 81 mg daily  Melatonin 2-5 mg at bedtime  [email or web address] She has had her COVID vaccinations and had some trouble with Headaches, chills, fatigue, paresthesias but she feels well now.

## 2019-06-21 NOTE — Progress Notes (Signed)
Virtual Visit via Video Note  I connected with [name] on 06/21/19 at  2:20 PM EST by a video enabled telemedicine application and verified that I am speaking with the correct person using two identifiers.  Location: Patient: home Provider: office   I discussed the limitations of evaluation and management by telemedicine and the availability of in person appointments. The patient expressed understanding and agreed to proceed. [name], CMA was able to set the patient setu p on a video visit.    Subjective:    Patient ID: [name], female    DOB: 1965/10/12, 54 y.o.   MRN: [id number]  Chief Complaint  Patient presents with  . Follow-up    cholesterol  and covid vaccine symptoms    HPI Patient is in today for follow up on chronic medical concerns. She was able to get the Covid vaccinations through her work and she did experience some side effects but she feels much better now. She had fatigue, malaise, myalgias, congestion, headache and paresthesias. She currently is having some trouble with right ear pain, congestion and pain over right sided sinuses. Denies CP/palp/SOB/fevers/GI or GU c/o. Taking meds as prescribed  Past Medical History:  Diagnosis Date  . Allergic rhinitis   . Anemia   . Anxiety   . Asthma   . Contact dermatitis and eczema   . Diverticulitis    CT Scan  . Esophageal reflux   . H. pylori infection   . Hyperlipidemia, mixed   . IBS (irritable bowel syndrome)   . IC (interstitial cystitis)   . Internal hemorrhoids   . Lumbago   . Mitral valve disorders(424.0)   . Osteoarthritis 07/08/2015  . Post concussive syndrome   . Seasonal and perennial allergic rhinitis 01/20/2015    Past Surgical History:  Procedure Laterality Date  . BARTHOLIN CYST MARSUPIALIZATION N/A 12/18/2013   Procedure: BARTHOLIN CYST MARSUPIALIZATION WITH BIOPSY;  Surgeon: Lovenia Kim, MD;  Location: [facility];  Service: Gynecology;  Laterality: N/A;  . CESAREAN SECTION     . DILITATION & CURRETTAGE/HYSTROSCOPY WITH NOVASURE ABLATION N/A 10/14/2012   Procedure: DILATATION & CURETTAGE/HYSTEROSCOPY WITH NOVASURE ABLATION;  Surgeon: Lovenia Kim, MD;  Location: [facility];  Service: Gynecology;  Laterality: N/A;  . MOUTH SURGERY    . SINUS SURGERY WITH INSTATRAK      Family History  Problem Relation Age of Onset  . Colon polyps Sister   . Mental illness Sister        anxiety from 9/11 survivors  . Varicose Veins Sister   . Diabetes Mother   . Hypertension Mother   . Heart attack Mother        MI at age 34  . Alcohol abuse Mother        smoker  . Heart disease Mother   . Dementia Mother   . Hypertension Father   . Hyperlipidemia Father   . Heart attack Father   . Cancer Father        lung cancer, smoker  . Cancer Brother        acute leukemia  . Dementia Maternal Grandmother   . Heart disease Maternal Grandfather        mi  . Cancer Paternal Grandmother        GYN cancer  . Other Sister        hypoglycemia  . Varicose Veins Sister   . Rheum arthritis Sister   . Pleurisy Sister   . Fibromyalgia Sister   .  Mental retardation Sister        depression  . GER disease Son   . Alcohol abuse Other        Family history  . Colon cancer Neg Hx   . Breast cancer Neg Hx     Social History   Socioeconomic History  . Marital status: Married    Spouse name: Not on file  . Number of children: 2  . Years of education: Masters  . Highest education level: Not on file  Occupational History  . Occupation: [employer]  Tobacco Use  . Smoking status: Never Smoker  . Smokeless tobacco: Never Used  Substance and Sexual Activity  . Alcohol use: Yes    Comment: Holidays only  . Drug use: No  . 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 Not on file  Social History Narrative   Lives at home home with husband and son.   Right-handed.    1 cup caffeine daily.   Social Determinants of Health   Financial Resource Strain:   . Difficulty of Paying Living Expenses: Not on file  Food Insecurity:   . Worried About Charity fundraiser in the Last Year: Not on file  . Ran Out of Food in the Last Year: Not on file  Transportation Needs:   . Lack of Transportation (Medical): Not on file  . Lack of Transportation (Non-Medical): Not on file  Physical Activity:   . Days of Exercise per Week: Not on file  . Minutes of Exercise per Session: Not on file  Stress:   . Feeling of Stress : Not on file  Social Connections:   . Frequency of Communication with Friends and Family: Not on file  . Frequency of Social Gatherings with Friends and Family: Not on file  . Attends Religious Services: Not on file  . Active Member of Clubs or Organizations: Not on file  . Attends Archivist Meetings: Not on file  . Marital Status: Not on file  Intimate Partner Violence:   . Fear of Current or Ex-Partner: Not on file  . Emotionally Abused: Not on file  . Physically Abused: Not on file  . Sexually Abused: Not on file    Outpatient Medications Prior to Visit  Medication Sig Dispense Refill  . albuterol (PROVENTIL) (2.5 MG/3ML) 0.083% nebulizer solution Take 3 mLs (2.5 mg total) by nebulization every 6 (six) hours as needed for wheezing or shortness of breath. 75 mL 0  . albuterol (VENTOLIN HFA) 108 (90 Base) MCG/ACT inhaler Inhale 2 puffs into the lungs every 6 (six) hours as needed for wheezing or shortness of breath. 1 Inhaler 6  . benzonatate (TESSALON) 200 MG capsule Take 1 capsule (200 mg total) by mouth 3 (three) times daily as needed for cough. 30 capsule 0  . Choline Fenofibrate 45 MG capsule Take 1 capsule (45 mg total) by mouth daily. 30 capsule 3  . famotidine (PEPCID) 40 MG tablet Take 1 tablet (40 mg total) by mouth at bedtime as needed for heartburn or indigestion. 30 tablet 3  . fluticasone (FLONASE) 50 MCG/ACT nasal spray  Place 2 sprays into both nostrils daily. 16 g 1  . hydrOXYzine (ATARAX/VISTARIL) 10 MG tablet Take 1/2 or 1 tablet every 8 hours as needed for anxiety 30 tablet 0  . Multiple Vitamins-Minerals (MULTIVITAMIN ADULT PO) Take 1 tablet  by mouth daily.    Marland Kitchen OVER THE COUNTER MEDICATION Calcium    . Probiotic Product (PROBIOTIC DAILY PO) Take 1 tablet by mouth daily.    Marland Kitchen doxycycline (VIBRA-TABS) 100 MG tablet Take 1 tablet (100 mg total) by mouth 2 (two) times daily. 20 tablet 0   No facility-administered medications prior to visit.    Allergies  Allergen Reactions  . Amoxicillin Shortness Of Breath    Doesn't remember rxn  . Aspirin Shortness Of Breath    "irritated stomach" Ibuprofen and Naproxen do not agree with her either  . Codeine Hives and Shortness Of Breath    Does not take percocet or hydrocodone; tylenol only  . Latex Shortness Of Breath and Itching  . Other Shortness Of Breath    peanuts  . Sulfa Antibiotics Hives and Shortness Of Breath  . Sulfamethoxazole-Trimethoprim Shortness Of Breath  . Sulfonamide Derivatives Shortness Of Breath and Itching  . Sulphadimidine Sodium [Sulfamethazine Sodium] Shortness Of Breath    Added to food for preservative  . Ciprofloxacin Other (See Comments)    REACTION: SOB, "Tightness in head" REACTION: SOB, "Tightness in head" Syncope   . Azithromycin Other (See Comments)    Faintness  . Carisoprodol   . Clarithromycin     "Doesn't agree with me"  . Epinephrine     Heart racing  . Fenofibrate Other (See Comments)    Leg cramps, body aches   . Metronidazole Other (See Comments)    "cannot tolerate"  . Nsaids   . Penicillins     Doesn't remember reaction  . Tolmetin   . Omeprazole Palpitations    Review of Systems  Constitutional: Negative for fever and malaise/fatigue.  HENT: Positive for congestion.   Eyes: Negative for blurred vision.  Respiratory: Positive for sputum production. Negative for shortness of breath.     Cardiovascular: Negative for chest pain, palpitations and leg swelling.  Gastrointestinal: Negative for abdominal pain, blood in stool and nausea.  Genitourinary: Negative for dysuria and frequency.  Musculoskeletal: Negative for falls.  Skin: Negative for rash.  Neurological: Positive for headaches. Negative for dizziness and loss of consciousness.  Endo/Heme/Allergies: Negative for environmental allergies.  Psychiatric/Behavioral: Negative for depression. The patient is not nervous/anxious.        Objective:    Physical Exam Constitutional:      Appearance: Normal appearance. She is not ill-appearing.  HENT:     Head: Normocephalic and atraumatic.  Eyes:     General:        Right eye: No discharge.        Left eye: No discharge.  Pulmonary:     Effort: Pulmonary effort is normal.  Neurological:     Mental Status: She is alert and oriented to person, place, and time.  Psychiatric:        Behavior: Behavior normal.     Ht 4\' 11"  (1.499 m)   Wt 105 lb (47.6 kg)   BMI 21.21 kg/m  Wt Readings from Last 3 Encounters:  06/20/19 105 lb (47.6 kg)  01/09/19 108 lb (49 kg)  11/02/18 109 lb 12.8 oz (49.8 kg)    Diabetic Foot Exam - Simple   No data filed     Lab Results  Component Value Date   WBC 4.6 01/17/2019   HGB 13.7 01/17/2019   HCT 38.8 01/17/2019   PLT 263.0 01/17/2019   GLUCOSE 117 (H) 01/17/2019   CHOL 206 (H) 01/17/2019   TRIG (H) 01/17/2019  422.0 Triglyceride is over 400; calculations on Lipids are invalid.   HDL 37.30 (L) 01/17/2019   LDLDIRECT 143.0 01/17/2019   LDLCALC 123 (H) 12/28/2017   ALT 10 01/17/2019   AST 16 01/17/2019   NA 136 01/17/2019   K 4.0 01/17/2019   CL 99 01/17/2019   CREATININE 0.56 01/17/2019   BUN 18 01/17/2019   CO2 28 01/17/2019   TSH 1.07 01/17/2019   HGBA1C 5.3 01/17/2019    Lab Results  Component Value Date   TSH 1.07 01/17/2019   Lab Results  Component Value Date   WBC 4.6 01/17/2019   HGB 13.7  01/17/2019   HCT 38.8 01/17/2019   MCV 98.1 01/17/2019   PLT 263.0 01/17/2019   Lab Results  Component Value Date   NA 136 01/17/2019   K 4.0 01/17/2019   CO2 28 01/17/2019   GLUCOSE 117 (H) 01/17/2019   BUN 18 01/17/2019   CREATININE 0.56 01/17/2019   BILITOT 0.6 01/17/2019   ALKPHOS 94 01/17/2019   AST 16 01/17/2019   ALT 10 01/17/2019   PROT 6.7 01/17/2019   ALBUMIN 4.2 01/17/2019   CALCIUM 9.3 01/17/2019   ANIONGAP 10 01/09/2019   GFR 112.92 01/17/2019   Lab Results  Component Value Date   CHOL 206 (H) 01/17/2019   Lab Results  Component Value Date   HDL 37.30 (L) 01/17/2019   Lab Results  Component Value Date   LDLCALC 123 (H) 12/28/2017   Lab Results  Component Value Date   TRIG (H) 01/17/2019    422.0 Triglyceride is over 400; calculations on Lipids are invalid.   Lab Results  Component Value Date   CHOLHDL 6 01/17/2019   Lab Results  Component Value Date   HGBA1C 5.3 01/17/2019       Assessment & Plan:   Problem List Items Addressed This Visit    Anemia - Primary   Relevant Orders   CBC   TSH   IRRITABLE BOWEL SYNDROME    Avoid offending foods, start probiotics. Do not eat large meals in late evening and consider raising head of bed. Continue fiber supplements.       Sinusitis    Encouraged increased rest and hydration, add probiotics, zinc such as Coldeze or Xicam. Treat fevers as needed. If her symptoms worsens she has a prescription for doxycycline to use if her symptoms worsen.       Relevant Medications   doxycycline (VIBRA-TABS) 100 MG tablet   Hyperlipidemia, mild    Encouraged heart healthy diet, increase exercise, avoid trans fats, she has taken fish oil and Metamucil recently. She will continue      Relevant Orders   Hemoglobin A1c   CBC   Comprehensive metabolic panel   Lipid panel   TSH   Educated about COVID-19 virus infection    Omron Blood Pressure cuff, upper arm, want BP 100-140/60-90 Pulse oximeter, want oxygen  in 90s  Weekly vitals  Take Multivitamin with minerals, selenium Vitamin D 1000-2000 IU daily Probiotic with lactobacillus and bifidophilus Asprin EC 81 mg daily  Melatonin 2-5 mg at bedtime  [email or web address] She has had her COVID vaccinations and had some trouble with Headaches, chills, fatigue, paresthesias but she feels well now.        Other Visit Diagnoses    Hyperglycemia       Relevant Orders   Hemoglobin A1c   Comprehensive metabolic panel   TSH      I have discontinued Syrianna Selover's doxycycline.  I am also having her start on doxycycline. Additionally, I am having her maintain her Probiotic Product (PROBIOTIC DAILY PO), OVER THE COUNTER MEDICATION, Multiple Vitamins-Minerals (MULTIVITAMIN ADULT PO), fluticasone, hydrOXYzine, albuterol, albuterol, benzonatate, famotidine, and Choline Fenofibrate.  Meds ordered this encounter  Medications  . doxycycline (VIBRA-TABS) 100 MG tablet    Sig: Take 1 tablet (100 mg total) by mouth 2 (two) times daily.    Dispense:  14 tablet    Refill:  0     I discussed the assessment and treatment plan with the patient. The patient was provided an opportunity to ask questions and all were answered. The patient agreed with the plan and demonstrated an understanding of the instructions.   The patient was advised to call back or seek an in-person evaluation if the symptoms worsen or if the condition fails to improve as anticipated.  I provided 25 minutes of non-face-to-face time during this encounter.   Penni Homans, MD

## 2019-06-27 ENCOUNTER — Telehealth: Payer: Self-pay | Admitting: Family Medicine

## 2019-06-27 NOTE — Telephone Encounter (Signed)
SB-How long does pt need to wait after receiving Covid Vaccine to receive Shingrix? Plz advise/thx dmf

## 2019-06-27 NOTE — Telephone Encounter (Signed)
Please would like to if it would be ok to get 2nd shingles shots, after covid vaccine.   Please advise

## 2019-06-27 NOTE — Telephone Encounter (Signed)
I tell people to wait a minimum ob 45 days since we just do not have enough info on how they interact yet.

## 2019-06-28 NOTE — Telephone Encounter (Signed)
Pt aware and is in agreement/thx dmf

## 2019-07-10 ENCOUNTER — Encounter: Payer: Self-pay | Admitting: Internal Medicine

## 2019-07-10 ENCOUNTER — Other Ambulatory Visit: Payer: Self-pay

## 2019-07-10 ENCOUNTER — Telehealth: Payer: Self-pay | Admitting: Internal Medicine

## 2019-07-10 ENCOUNTER — Ambulatory Visit: Payer: 59 | Admitting: Internal Medicine

## 2019-07-10 DIAGNOSIS — J3089 Other allergic rhinitis: Secondary | ICD-10-CM | POA: Diagnosis not present

## 2019-07-10 DIAGNOSIS — J302 Other seasonal allergic rhinitis: Secondary | ICD-10-CM | POA: Diagnosis not present

## 2019-07-10 DIAGNOSIS — J4520 Mild intermittent asthma, uncomplicated: Secondary | ICD-10-CM | POA: Diagnosis not present

## 2019-07-10 NOTE — Progress Notes (Signed)
Patient ID: [name], female    DOB: July 24, 1965, 53 y.o.   MRN: [id number]  HPI female never smoker followed for allergic rhinitis, asthma. Complicated by History anxiety, GERD, IBS Office Spirometry 08/01/2015-within normal. FVC 3.02/112%, FEV1 2.36/104%, FEV1/FVC 0.78, FEF 25-75 percent 2.26/84%. PFT 11/26/2010 within normal limits within significant response to bronchodilator. FEV1/FVC 0.79  --------------------------------------------------------------------------------   09/09/2018- 54 year old female never smoker followed for allergic rhinitis, asthma.  Complicated by GERD, history anxiety, IBS Phone Note 09/08/2018- She stated that she has finished the prednisone that her PCP gave her on 4/24. She is still experiencing a cough and some chest tightness. Denies any fever, SOB or body aches.  She also stated that she took the Singulair for a few days and then stopped due to the side effects. She started to feel "swimmy headed" and not like herself with the Singulair. Qvar Redihaler, albuterol hfa, neb albuterol, Flonase,  Malaise x 1 month. Finished medrol taper 2 weeks ago- did help tight throt and chest. Some mild wheeze supine. Tessalon helped. Chest feels "weird", tight with scant clear bubbly mucus. Alavert helped. 59 yo son similar.  SarCoV2 IgG Neg 08/24/2018  07/10/19- 54 year old female never smoker followed for allergic rhinitis, asthma.  Complicated by GERD, history anxiety, IBS Neb albuterol, albuterol hfa, benzonatate, flonase, -----f/u Exacerbation of asthma . Patient stated her breathing is at her baseline.  Had flu vax and 2 Covax Not needing bronchodilators currently Mild headache assoc w new eyeglasses. Dry R eye x few weeks. Denies sneeze, drainage, wheeze or cough. Notices sore, stiff joints.   Review of Systems-see HPI     + = positive Constitutional:   No-   weight loss, night sweats, fevers, chills, fatigue, lassitude. HEENT:    headaches, difficulty  swallowing, tooth/dental problems, sore throat,       No-  sneezing, itching, ear ache, nasal congestion, +post nasal drip,  CV:  No-   chest pain, orthopnea, PND, swelling in lower extremities, anasarca, dizziness, palpitations Resp: shortness of breath with exertion or at rest.                non-productive cough,  No- coughing up of blood.                 change in color of mucus.  No recent  wheezing.   Skin: No-   rash or lesions. GI:  No-   heartburn, indigestion, abdominal pain, nausea, vomiting, GU:  MS: + joint pain or swelling.   Neuro-     nothing unusual Psych:  No- change in mood or affect. No depression + anxiety.  No memory loss.  Objective:   Physical Exam General- Alert, Oriented, Affect-anxious/ talkative, Distress- none acute, trim, petite woman    Skin- rash-none, lesions- none, excoriation- none Lymphadenopathy- none Head- atraumatic            Eyes- Gross vision intact, PERRLA, conjunctivae clear secretions            Ears- Hearing, canals normal. +Scarring or sclerosis TMs            Nose- +Narrow with turbinate edema, clear mucus bridging, No- Septal dev, polyps, erosion, perforation             Throat- Mallampati III , mucosa clear , drainage- none, tonsils- atrophic Neck- flexible , trachea midline, no stridor , thyroid nl, carotid no bruit Chest - symmetrical excursion , unlabored           Heart/CV-  RRR , no murmur , no gallop  , no rub, nl s1 s2                           - JVD- none , edema- none, stasis changes- none, varices- none           Lung- clear to P&A, wheeze- none, cough- none , dullness-none, rub- none           Chest wall-  Abd-  Br/ Gen/ Rectal- Not done, not indicated Extrem- cyanosis- none, clubbing, none, atrophy- none, strength- nl Neuro- grossly intact to observation

## 2019-07-10 NOTE — Telephone Encounter (Signed)
Spoke with pt, states she has a cough that is typical for her this time of year d/t her asthma and seasonal allergies.  Pt notes that she has received 2 doses of the Covid vaccine.   Discussed with CY, who advised that it was ok for pt to come in to clinic.   Pt aware.  No change to appt made.  Nothing further needed at this time- will close encounter.

## 2019-07-10 NOTE — Patient Instructions (Signed)
Ok to use you bronchodilator meds as needed  Please call if we can help

## 2019-07-10 NOTE — Telephone Encounter (Signed)
ATC Patient. LM for patient to call back.

## 2019-07-11 NOTE — Telephone Encounter (Signed)
LMTCB x2 for pt 

## 2019-07-17 ENCOUNTER — Telehealth: Payer: Self-pay | Admitting: *Deleted

## 2019-07-17 ENCOUNTER — Ambulatory Visit: Payer: 59 | Attending: Internal Medicine

## 2019-07-17 DIAGNOSIS — Z20822 Contact with and (suspected) exposure to covid-19: Secondary | ICD-10-CM

## 2019-07-17 NOTE — Telephone Encounter (Signed)
Spoke with pt for COVID screening prior to lab appt tomorrow morning. Pt told me she had sinus drainage on Thursday and Friday and she has history of allergies. She states that she saw pulmonology last week and they told her they thought it was allergies too.  I advised pt I would refer to PCP recommendation as to whether we should proceed with lab appt tomorrow.  If not, pt wants to know if she should be tested for COVID?  Please advise?

## 2019-07-17 NOTE — Telephone Encounter (Signed)
Write her out of work for 7 days and then if she tests negative we can send her back sooner if she like. Thanks

## 2019-07-17 NOTE — Telephone Encounter (Signed)
Notified pt. She is agreeable to proceed with testing and does apologize for getting upset at initial call.  Now states she has not felt good on and off the last few days.  Contact information given to pt to schedule COVID test with [facility]. She states she will need a note from PCP to give to employer that she is being tested due to symptoms. Please advise how many days to write pt out of work?

## 2019-07-17 NOTE — Telephone Encounter (Signed)
I would say we should put her labs off a couple of days and help her get setup with covid testing

## 2019-07-18 ENCOUNTER — Other Ambulatory Visit: Payer: 59

## 2019-07-18 LAB — NOVEL CORONAVIRUS, NAA: SARS-CoV-2, NAA: NOT DETECTED

## 2019-07-18 LAB — SARS-COV-2, NAA 2 DAY TAT

## 2019-07-18 NOTE — Telephone Encounter (Signed)
Note created and routed to pt via mychart.

## 2019-07-18 NOTE — Telephone Encounter (Signed)
LMTCB x3 for pt. We have attempted to contact pt several times with no success or call back from pt. Per triage protocol, message will be closed.   

## 2019-07-19 ENCOUNTER — Telehealth: Payer: Self-pay | Admitting: Family Medicine

## 2019-07-19 NOTE — Telephone Encounter (Signed)
  Call : Antony Haste  Call Back # [phone]   Patient requested to come into lab this week but, due to current covid restrictions patient is not able to have lab work drawn due to symptoms she is displaying. Pt was advised to reschedule lab appointment. Patient is now wondering if she is safe to return to work or should she stay out until Friday March 24,2021.  Please advise

## 2019-07-20 ENCOUNTER — Telehealth: Payer: Self-pay

## 2019-07-20 NOTE — Telephone Encounter (Signed)
Called patient left message for patient to call the office back  

## 2019-07-20 NOTE — Telephone Encounter (Signed)
Patient called in to see if Dr. Charlett Blake could send a return back to work note to her My. Chart. The patients Covid-19 Test came back negative and she has to return back to work on Friday 07-21-2019  Please follow up with the patient at [phone]

## 2019-07-20 NOTE — Telephone Encounter (Signed)
Letter sent to patients mychart

## 2019-07-21 NOTE — Telephone Encounter (Signed)
Also, when will pt be cleared to return for lab appt?

## 2019-07-21 NOTE — Telephone Encounter (Signed)
Since her covid test was negative she can be released back to work on Monday unless she is febrile or worsening

## 2019-07-21 NOTE — Telephone Encounter (Signed)
Patient has returned to work.  Did not see that there was another phone message.

## 2019-07-21 NOTE — Telephone Encounter (Signed)
Is she good to schedule for lab appointment?

## 2019-07-23 NOTE — Telephone Encounter (Signed)
Yes she can have a lab appointment now also

## 2019-07-24 NOTE — Telephone Encounter (Signed)
Left message on machine to schedule lab only appointment

## 2019-07-27 ENCOUNTER — Telehealth: Payer: Self-pay | Admitting: Family Medicine

## 2019-07-27 NOTE — Telephone Encounter (Signed)
Patient called back today to sch lab work for her visit. Patient wants to know if this is regarding her physical in June . Patient would like to be advised if she should just do lab work for her April appt on the 13th.

## 2019-07-27 NOTE — Telephone Encounter (Signed)
Patient will go over to the walkin lab at [place] for lab draw on 4/7, since we do not have any lab openings.

## 2019-07-27 NOTE — Addendum Note (Signed)
Addended by: Kem Boroughs D on: 07/27/2019 03:07 PM   Modules accepted: Orders

## 2019-08-01 ENCOUNTER — Telehealth: Payer: Self-pay | Admitting: Family Medicine

## 2019-08-01 NOTE — Telephone Encounter (Signed)
Pt wants to switch labs from [facility] to our office

## 2019-08-01 NOTE — Telephone Encounter (Signed)
Left detailed message that we do not have any lab openings here this week  She would like to get labs drawn before appointment.    The best bet is to go to [place] to get labs drawn.

## 2019-08-02 NOTE — Assessment & Plan Note (Signed)
Uncomplicated and quiet for now.  Plan- keep meds available as spring pollen season evolves. Discussed.

## 2019-08-02 NOTE — Addendum Note (Signed)
Addended by: Kelle Darting A on: 08/02/2019 11:35 AM   Modules accepted: Orders

## 2019-08-02 NOTE — Addendum Note (Signed)
Addended by: Kelle Darting A on: 08/02/2019 11:18 AM   Modules accepted: Orders

## 2019-08-02 NOTE — Assessment & Plan Note (Signed)
Main relevant symptom might be some conjunctival irritation, but may be unrelated. Plan- symptom management with flonase/ antihistamine if needed. Ok to try artificial tears.

## 2019-08-08 ENCOUNTER — Ambulatory Visit: Payer: 59 | Admitting: Family Medicine

## 2019-08-11 ENCOUNTER — Ambulatory Visit (INDEPENDENT_AMBULATORY_CARE_PROVIDER_SITE_OTHER): Payer: 59 | Admitting: Medical

## 2019-08-11 ENCOUNTER — Other Ambulatory Visit: Payer: Self-pay

## 2019-08-11 ENCOUNTER — Ambulatory Visit (HOSPITAL_BASED_OUTPATIENT_CLINIC_OR_DEPARTMENT_OTHER)
Admission: RE | Admit: 2019-08-11 | Discharge: 2019-08-11 | Disposition: A | Discharge status: Home / Self Care | Payer: 59 | Source: Ambulatory Visit | Attending: Medical | Admitting: Medical

## 2019-08-11 VITALS — BP 112/63 | HR 80 | Resp 18 | Ht 59.0 in | Wt 106.8 lb

## 2019-08-11 DIAGNOSIS — E785 Hyperlipidemia, unspecified: Secondary | ICD-10-CM

## 2019-08-11 DIAGNOSIS — M542 Cervicalgia: Secondary | ICD-10-CM

## 2019-08-11 DIAGNOSIS — S46812A Strain of other muscles, fascia and tendons at shoulder and upper arm level, left arm, initial encounter: Secondary | ICD-10-CM

## 2019-08-11 DIAGNOSIS — M79602 Pain in left arm: Secondary | ICD-10-CM

## 2019-08-11 MED ORDER — METHYLPREDNISOLONE 4 MG PO TABS: ORAL_TABLET | ORAL | 0 refills | Status: DC

## 2019-08-11 NOTE — Progress Notes (Signed)
Subjective:    Patient ID: [name], female    DOB: Jun 30, 1965, 54 y.o.   MRN: [id number]  HPI  Pt in with some left side of neck and pain down to her left arm.   Pt states pain started on Wednesday. She states has been working out. Pt states dull ache. Pain present for 48 hours. She put heat on left trapezius area and it did feel better.   Pt pain increased when she sits up. She seems to have some positional pain. When she lies on her back seems to relieve pain. She points to area at base of the neck/left side.  Pt went to acupunture after work on Wednesday. Pain seemed to help some.  Pt has tried tylenol and notes when she took hot shower pain was less.  In morning after sleeping pain a lot less then with movement pain comes back.   Pt has high cholesterol. Mom had mi at age 7 yo. Pt labs done in past not fasting. Repeating on Monday.  No jaw pain, no sob and no abnormal sweating. But notes she thinks going thru menopause.   Pt has seen dr. Stanford Breed. Pt states her prior stress test was negative and prior echo good.     Review of Systems  Constitutional: Negative for chills, fatigue and fever.  Respiratory: Negative for cough, chest tightness, shortness of breath and wheezing.   Cardiovascular: Negative for chest pain and palpitations.  Gastrointestinal: Negative for abdominal pain.  Musculoskeletal:       See hpi.  Neurological: Negative for dizziness, speech difficulty, weakness, numbness and headaches.  Hematological: Negative for adenopathy. Does not bruise/bleed easily.  Psychiatric/Behavioral: Negative for behavioral problems and confusion.    Past Medical History:  Diagnosis Date  . Allergic rhinitis   . Anemia   . Anxiety   . Asthma   . Contact dermatitis and eczema   . Diverticulitis    CT Scan  . Esophageal reflux   . H. pylori infection   . Hyperlipidemia, mixed   . IBS (irritable bowel syndrome)   . IC (interstitial cystitis)   . Internal  hemorrhoids   . Lumbago   . Mitral valve disorders(424.0)   . Osteoarthritis 07/08/2015  . Post concussive syndrome   . Seasonal and perennial allergic rhinitis 01/20/2015     Social History   Socioeconomic History  . Marital status: Married    Spouse name: Not on file  . Number of children: 2  . Years of education: Masters  . Highest education level: Not on file  Occupational History  . Occupation: [employer]  Tobacco Use  . Smoking status: Never Smoker  . Smokeless tobacco: Never Used  Substance and Sexual Activity  . Alcohol use: Yes    Comment: Holidays only  . Drug use: No  . 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 Not on file  Social History Narrative   Lives at home home with husband and son.   Right-handed.   1 cup caffeine daily.   Social Determinants of Health   Financial Resource Strain:   . Difficulty of Paying Living Expenses:   Food Insecurity:   . Worried About Charity fundraiser in the Last Year:   . Arboriculturist in the Last Year:   Transportation Needs:   . Film/video editor (Medical):   Marland Kitchen  Lack of Transportation (Non-Medical):   Physical Activity:   . Days of Exercise per Week:   . Minutes of Exercise per Session:   Stress:   . Feeling of Stress :   Social Connections:   . Frequency of Communication with Friends and Family:   . Frequency of Social Gatherings with Friends and Family:   . Attends Religious Services:   . Active Member of Clubs or Organizations:   . Attends Archivist Meetings:   Marland Kitchen Marital Status:   Intimate Partner Violence:   . Fear of Current or Ex-Partner:   . Emotionally Abused:   Marland Kitchen Physically Abused:   . Sexually Abused:     Past Surgical History:  Procedure Laterality Date  . BARTHOLIN CYST MARSUPIALIZATION N/A 12/18/2013   Procedure: BARTHOLIN CYST MARSUPIALIZATION WITH BIOPSY;   Surgeon: Lovenia Kim, MD;  Location: [facility];  Service: Gynecology;  Laterality: N/A;  . CESAREAN SECTION    . DILITATION & CURRETTAGE/HYSTROSCOPY WITH NOVASURE ABLATION N/A 10/14/2012   Procedure: DILATATION & CURETTAGE/HYSTEROSCOPY WITH NOVASURE ABLATION;  Surgeon: Lovenia Kim, MD;  Location: [facility];  Service: Gynecology;  Laterality: N/A;  . MOUTH SURGERY    . SINUS SURGERY WITH INSTATRAK      Family History  Problem Relation Age of Onset  . Colon polyps Sister   . Mental illness Sister        anxiety from 9/11 survivors  . Varicose Veins Sister   . Diabetes Mother   . Hypertension Mother   . Heart attack Mother        MI at age 63  . Alcohol abuse Mother        smoker  . Heart disease Mother   . Dementia Mother   . Hypertension Father   . Hyperlipidemia Father   . Heart attack Father   . Cancer Father        lung cancer, smoker  . Cancer Brother        acute leukemia  . Dementia Maternal Grandmother   . Heart disease Maternal Grandfather        mi  . Cancer Paternal Grandmother        GYN cancer  . Other Sister        hypoglycemia  . Varicose Veins Sister   . Rheum arthritis Sister   . Pleurisy Sister   . Fibromyalgia Sister   . Mental retardation Sister        depression  . GER disease Son   . Alcohol abuse Other        Family history  . Colon cancer Neg Hx   . Breast cancer Neg Hx     Allergies  Allergen Reactions  . Amoxicillin Shortness Of Breath    Doesn't remember rxn  . Aspirin Shortness Of Breath    "irritated stomach" Ibuprofen and Naproxen do not agree with her either  . Codeine Hives and Shortness Of Breath    Does not take percocet or hydrocodone; tylenol only  . Latex Shortness Of Breath and Itching  . Other Shortness Of Breath    peanuts  . Sulfa Antibiotics Hives and Shortness Of Breath  . Sulfamethoxazole-Trimethoprim Shortness Of Breath  . Sulfonamide Derivatives Shortness Of Breath and Itching  . Sulphadimidine Sodium  [Sulfamethazine Sodium] Shortness Of Breath    Added to food for preservative  . Ciprofloxacin Other (See Comments)    REACTION: SOB, "Tightness in head" REACTION: SOB, "Tightness in head" Syncope   .  Azithromycin Other (See Comments)    Faintness  . Carisoprodol   . Clarithromycin     "Doesn't agree with me"  . Epinephrine     Heart racing  . Fenofibrate Other (See Comments)    Leg cramps, body aches   . Metronidazole Other (See Comments)    "cannot tolerate"  . Nsaids   . Penicillins     Doesn't remember reaction  . Tolmetin   . Omeprazole Palpitations    Current Outpatient Medications on File Prior to Visit  Medication Sig Dispense Refill  . albuterol (PROVENTIL) (2.5 MG/3ML) 0.083% nebulizer solution Take 3 mLs (2.5 mg total) by nebulization every 6 (six) hours as needed for wheezing or shortness of breath. (Patient not taking: Reported on 07/10/2019) 75 mL 0  . albuterol (VENTOLIN HFA) 108 (90 Base) MCG/ACT inhaler Inhale 2 puffs into the lungs every 6 (six) hours as needed for wheezing or shortness of breath. (Patient not taking: Reported on 07/10/2019) 1 Inhaler 6  . Multiple Vitamins-Minerals (MULTIVITAMIN ADULT PO) Take 1 tablet by mouth daily.    . Omega-3 Fatty Acids (FISH OIL) 1000 MG CAPS Take by mouth.    Marland Kitchen OVER THE COUNTER MEDICATION Calcium    . Probiotic Product (PROBIOTIC DAILY PO) Take 1 tablet by mouth daily.    . sodium chloride (OCEAN) 0.65 % SOLN nasal spray Place 1 spray into both nostrils as needed for congestion.     No current facility-administered medications on file prior to visit.    BP 112/63 (BP Location: Left Arm, Patient Position: Sitting, Cuff Size: Normal)   Pulse 80   Resp 18   Ht 4\' 11"  (1.499 m)   Wt 106 lb 12.8 oz (48.4 kg)   SpO2 98%   BMI 21.57 kg/m       Objective:   Physical Exam  General- No acute distress. Pleasant patient. Neck- Full range of motion, no jvd Lungs- Clear, even and unlabored. Heart- regular rate and  rhythm. Neurologic- CNII- XII grossly intact.  Left shoulder- no pain on palpation. No crepitus on rom. But on left upper ext rotation pain in left trapezius.  Neck- left side trapezius tenderness to palpation. Left upper- mild tender left tricep. Arm not swollen. Normal color. No dilated veins.      Assessment & Plan:  You do appear to have  pain in left trapezius/neck area with some pain radiating toward the left arm. Pain on palpation of trapezius and pain in trapezius when left arm rotated.  We gave you brief course of medrol. Use thermacare heat patch.  ekg done today as precaution but your presentation definitely is muscle type/soft tissue type pain. Ekg showed  We are approaching this weekend so if your neck and arm pain worsen despite the above treatment or if you have cardiac type associated symptoms then have to recommend ED evaluation.  Will get cspine xray to evaluate for degenerative changes and hopefully get information on foramen.  Follow up 7 days or as needed  Time spent with patient today was 30  minutes which consisted of discussing differential diagnosis, work up treatment, and documentation.  Mackie Pai, PA-C

## 2019-08-11 NOTE — Patient Instructions (Addendum)
You do appear to have  pain in left trapezius/neck area with some pain radiating toward the left arm. Pain on palpation of trapezius and pain in trapezius when left arm rotated.  We gave you brief course of medro. Use thermacare heat patch.  ekg done today as precaution but your presentation definitely is muscle type/soft tissue type pain. Ekg showed normal sinus rhythm.  We are approaching this weekend so if your neck and arm pain worsen despite the above treatment or if you have cardiac type associated symptoms then have to recommend ED evaluation.  Will get cspine xray to evaluate for degenerative changes and hopefully get information on foramen.  Follow up 7 days or as needed

## 2019-08-14 ENCOUNTER — Other Ambulatory Visit: Payer: Self-pay

## 2019-08-14 ENCOUNTER — Encounter: Payer: Self-pay | Admitting: Family Medicine

## 2019-08-14 ENCOUNTER — Telehealth: Payer: Self-pay | Admitting: Family Medicine

## 2019-08-14 ENCOUNTER — Other Ambulatory Visit (INDEPENDENT_AMBULATORY_CARE_PROVIDER_SITE_OTHER): Payer: 59

## 2019-08-14 ENCOUNTER — Other Ambulatory Visit: Payer: Self-pay | Admitting: Family Medicine

## 2019-08-14 DIAGNOSIS — R739 Hyperglycemia, unspecified: Secondary | ICD-10-CM | POA: Diagnosis not present

## 2019-08-14 DIAGNOSIS — M542 Cervicalgia: Secondary | ICD-10-CM

## 2019-08-14 DIAGNOSIS — E2839 Other primary ovarian failure: Secondary | ICD-10-CM

## 2019-08-14 DIAGNOSIS — E785 Hyperlipidemia, unspecified: Secondary | ICD-10-CM

## 2019-08-14 DIAGNOSIS — Z780 Asymptomatic menopausal state: Secondary | ICD-10-CM

## 2019-08-14 DIAGNOSIS — D649 Anemia, unspecified: Secondary | ICD-10-CM | POA: Diagnosis not present

## 2019-08-14 LAB — COMPREHENSIVE METABOLIC PANEL
ALT: 13 U/L (ref 0–35)
AST: 15 U/L (ref 0–37)
Albumin: 4.5 g/dL (ref 3.5–5.2)
Alkaline Phosphatase: 86 U/L (ref 39–117)
BUN: 17 mg/dL (ref 6–23)
CO2: 29 mEq/L (ref 19–32)
Calcium: 9.6 mg/dL (ref 8.4–10.5)
Chloride: 103 mEq/L (ref 96–112)
Creatinine, Ser: 0.62 mg/dL (ref 0.40–1.20)
GFR: 100.19 mL/min (ref >60.00)
Glucose, Bld: 72 mg/dL (ref 70–99)
Potassium: 4 mEq/L (ref 3.5–5.1)
Sodium: 140 mEq/L (ref 135–145)
Total Bilirubin: 1 mg/dL (ref 0.2–1.2)
Total Protein: 7 g/dL (ref 6.0–8.3)

## 2019-08-14 LAB — CBC
HCT: 40.2 % (ref 36.0–46.0)
Hemoglobin: 13.8 g/dL (ref 12.0–15.0)
MCHC: 34.4 g/dL (ref 30.0–36.0)
MCV: 98.4 fl (ref 78.0–100.0)
Platelets: 295 10*3/uL (ref 150.0–400.0)
RBC: 4.08 Mil/uL (ref 3.87–5.11)
RDW: 12.4 % (ref 11.5–15.5)
WBC: 5.4 10*3/uL (ref 4.0–10.5)

## 2019-08-14 LAB — LIPID PANEL
Cholesterol: 199 mg/dL (ref 0–200)
HDL: 58.1 mg/dL (ref >39.00)
LDL Cholesterol: 125 mg/dL — ABNORMAL HIGH (ref 0–99)
NonHDL: 141.27
Total CHOL/HDL Ratio: 3
Triglycerides: 82 mg/dL (ref 0.0–149.0)
VLDL: 16.4 mg/dL (ref 0.0–40.0)

## 2019-08-14 LAB — TSH: TSH: 2.27 u[IU]/mL (ref 0.35–4.50)

## 2019-08-14 LAB — HEMOGLOBIN A1C: Hgb A1c MFr Bld: 5 % (ref 4.6–6.5)

## 2019-08-14 NOTE — Telephone Encounter (Signed)
I have ordered both please let her know

## 2019-08-14 NOTE — Telephone Encounter (Signed)
Pt came in office stating is needing a referral for physical therapy and also if she can get a Bone Density referral also. Pt states saw [facility] recently for situation. Pt is wanteding to be called at

## 2019-08-14 NOTE — Telephone Encounter (Signed)
Pt is wanting to be called at [phone] to be informed about referrals. Pt mentioned if physical therapy needs to be sent to sport medicine that it would be ok.

## 2019-08-15 NOTE — Telephone Encounter (Signed)
Patient notified that both referrals was placed.

## 2019-08-17 ENCOUNTER — Ambulatory Visit: Payer: 59 | Admitting: Family Medicine

## 2019-08-18 ENCOUNTER — Telehealth: Payer: Self-pay

## 2019-08-18 NOTE — Telephone Encounter (Signed)
Patient would like for Dr. Charlett Blake or the nurse to give her a call concerning her physical therapy appoint please call the patient back at [phone]

## 2019-08-18 NOTE — Telephone Encounter (Signed)
Patient see PT on 08/23/19 and she would like to know if we can add PT of her finger.  She saw Percell Miller back 11/25/2017, she slammed her right index finger in a door.  She is still having some issues with it.

## 2019-08-20 ENCOUNTER — Other Ambulatory Visit: Payer: Self-pay | Admitting: Family Medicine

## 2019-08-20 DIAGNOSIS — S67190D Crushing injury of right index finger, subsequent encounter: Secondary | ICD-10-CM

## 2019-08-23 ENCOUNTER — Other Ambulatory Visit (HOSPITAL_BASED_OUTPATIENT_CLINIC_OR_DEPARTMENT_OTHER): Payer: 59

## 2019-08-23 ENCOUNTER — Encounter: Payer: Self-pay | Admitting: Physical Therapy

## 2019-08-23 ENCOUNTER — Ambulatory Visit: Payer: 59 | Attending: Family Medicine | Admitting: Physical Therapy

## 2019-08-23 ENCOUNTER — Other Ambulatory Visit: Payer: Self-pay

## 2019-08-23 DIAGNOSIS — R29898 Other symptoms and signs involving the musculoskeletal system: Secondary | ICD-10-CM | POA: Insufficient documentation

## 2019-08-23 DIAGNOSIS — M25641 Stiffness of right hand, not elsewhere classified: Secondary | ICD-10-CM

## 2019-08-23 DIAGNOSIS — M542 Cervicalgia: Secondary | ICD-10-CM

## 2019-08-23 DIAGNOSIS — M6281 Muscle weakness (generalized): Secondary | ICD-10-CM | POA: Insufficient documentation
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
## 2019-08-24 ENCOUNTER — Ambulatory Visit (HOSPITAL_BASED_OUTPATIENT_CLINIC_OR_DEPARTMENT_OTHER)
Admission: RE | Admit: 2019-08-24 | Discharge: 2019-08-24 | Disposition: A | Discharge status: Home / Self Care | Payer: 59 | Source: Ambulatory Visit | Attending: Family Medicine | Admitting: Family Medicine

## 2019-08-24 ENCOUNTER — Telehealth: Payer: Self-pay | Admitting: *Deleted

## 2019-08-24 DIAGNOSIS — Z780 Asymptomatic menopausal state: Secondary | ICD-10-CM | POA: Diagnosis present

## 2019-08-24 DIAGNOSIS — E2839 Other primary ovarian failure: Secondary | ICD-10-CM | POA: Diagnosis present

## 2019-08-24 NOTE — Telephone Encounter (Signed)
FMLA paperwork was received and called patient to see what it was needed for.  Patient has an appt tomorrow to discuss about being out of work due to high levels of anxiety, panic attacks, and stressed because of covid cases that they announce at work.

## 2019-08-25 ENCOUNTER — Ambulatory Visit (INDEPENDENT_AMBULATORY_CARE_PROVIDER_SITE_OTHER): Payer: 59 | Admitting: Family Medicine

## 2019-08-25 ENCOUNTER — Encounter: Payer: Self-pay | Admitting: Family Medicine

## 2019-08-25 ENCOUNTER — Telehealth: Payer: Self-pay | Admitting: *Deleted

## 2019-08-25 ENCOUNTER — Other Ambulatory Visit: Payer: Self-pay

## 2019-08-25 ENCOUNTER — Other Ambulatory Visit: Payer: Self-pay | Admitting: Family Medicine

## 2019-08-25 DIAGNOSIS — F419 Anxiety disorder, unspecified: Secondary | ICD-10-CM | POA: Diagnosis not present

## 2019-08-25 DIAGNOSIS — R413 Other amnesia: Secondary | ICD-10-CM

## 2019-08-25 MED ORDER — SERTRALINE HCL 25 MG PO TABS: 12.5000 mg | ORAL_TABLET | Freq: Every day | ORAL | 3 refills | Status: DC

## 2019-08-25 MED ORDER — HYDROXYZINE HCL 10 MG PO TABS: 10.0000 mg | ORAL_TABLET | Freq: Three times a day (TID) | ORAL | 2 refills | Status: DC | PRN

## 2019-08-25 NOTE — Telephone Encounter (Signed)
We discussed her staying out of work for next 12 weeks. We started meds for anxiety and depression today and referred her to counselor. I will also place a referral for neurology for memory loss.

## 2019-08-25 NOTE — Telephone Encounter (Signed)
Patient would like a referral to neurology for memory loss.  She state that she has trouble remembering things and was seeing an neurologist at [facility] last year.   She wanted to know if you think she should follow up on that.  She would like to see someone different.   You can see the note in care everywhere from 10/19/18 from [facility]. I see where she left a phone message with them on yesterday stating this "Patient states she is still having memory issues and feels she needs to follow up. She said she is struggling at work. She said her anxiety and depression has also gotten worse. She is asking what should she do next. She saw Dr. Verdene Rio for memory. Please advise on schedule instructions. "

## 2019-08-27 NOTE — Assessment & Plan Note (Addendum)
Patient is worsening and is how having panic attacks, difficulty concentrating, fatigue. 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 She is forgetful and has trouble with malaise and myalgias. Her last day of work was 08/23/19 so we will excuse her from work from 4/29 and she will need to be off work for next 6-12 weeks as her medications take effect and counseling to begin. Is started on Sertraline 25 mg daily x 7 days and then increase to 50 mg daily. Given Hydroyzine to use prn for anxiety and sleep and she is referred to [facility]. Spent 40 minutes counseling the patient, discussing treatment options and prescribing plan of care.

## 2019-08-27 NOTE — Progress Notes (Signed)
Virtual Visit via phone Note  I connected with [name] on 08/25/19 at  9:20 AM EDT by a video enabled telemedicine application and verified that I am speaking with the correct person using two identifiers.  Location: Patient: home Provider: home   I discussed the limitations of evaluation and management by telemedicine and the availability of in person appointments. The patient expressed understanding and agreed to proceed. Kem Boroughs, CMA was aable to get the patient set up on a phone call after being unable to set up a video call.    Subjective:    Patient ID: [name], female    DOB: 19-Sep-1965, 54 y.o.   MRN: [id number]  Chief Complaint  Patient presents with  . Panic Attack    HPI Patient is in today for evaluation of worsening anxiety. Patient is worsening and is how having panic attacks, difficulty concentrating, fatigue. 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 She is forgetful and has trouble with malaise and myalgias. Her last day of work was 08/23/19. Denies CP/palp/SOB/HA/congestion/fevers/GI or GU c/o. Taking meds as prescribed  Past Medical History:  Diagnosis Date  . Allergic rhinitis   . Anemia   . Anxiety   . Asthma   . Contact dermatitis and eczema   . Diverticulitis    CT Scan  . Esophageal reflux   . H. pylori infection   . Hyperlipidemia, mixed   . IBS (irritable bowel syndrome)   . IC (interstitial cystitis)   . Internal hemorrhoids   . Lumbago   . Mitral valve disorders(424.0)   . Osteoarthritis 07/08/2015  . Post concussive syndrome   . Seasonal and perennial allergic rhinitis 01/20/2015    Past Surgical History:  Procedure Laterality Date  . BARTHOLIN CYST MARSUPIALIZATION N/A 12/18/2013   Procedure: BARTHOLIN CYST MARSUPIALIZATION WITH BIOPSY;  Surgeon: Lovenia Kim, MD;  Location: [facility];  Service: Gynecology;  Laterality: N/A;  . CESAREAN SECTION    . DILITATION & CURRETTAGE/HYSTROSCOPY  WITH NOVASURE ABLATION N/A 10/14/2012   Procedure: DILATATION & CURETTAGE/HYSTEROSCOPY WITH NOVASURE ABLATION;  Surgeon: Lovenia Kim, MD;  Location: [facility];  Service: Gynecology;  Laterality: N/A;  . MOUTH SURGERY    . SINUS SURGERY WITH INSTATRAK      Family History  Problem Relation Age of Onset  . Colon polyps Sister   . Mental illness Sister        anxiety from 9/11 survivors  . Varicose Veins Sister   . Diabetes Mother   . Hypertension Mother   . Heart attack Mother        MI at age 62  . Alcohol abuse Mother        smoker  . Heart disease Mother   . Dementia Mother   . Hypertension Father   . Hyperlipidemia Father   . Heart attack Father   . Cancer Father        lung cancer, smoker  . Cancer Brother        acute leukemia  . Dementia Maternal Grandmother   . Heart disease Maternal Grandfather        mi  . Cancer Paternal Grandmother        GYN cancer  . Other Sister        hypoglycemia  . Varicose Veins Sister   . Rheum arthritis Sister   . Pleurisy Sister   . Fibromyalgia Sister   . Mental retardation Sister  depression  . GER disease Son   . Alcohol abuse Other        Family history  . Colon cancer Neg Hx   . Breast cancer Neg Hx     Social History   Socioeconomic History  . Marital status: Married    Spouse name: Not on file  . Number of children: 2  . Years of education: Masters  . Highest education level: Not on file  Occupational History  . Occupation: [employer]  Tobacco Use  . Smoking status: Never Smoker  . Smokeless tobacco: Never Used  Substance and Sexual Activity  . Alcohol use: Yes    Comment: Holidays only  . Drug use: No  . 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 Not on file  Social History Narrative   Lives at home home with husband and son.   Right-handed.   1 cup caffeine daily.   Social  Determinants of Health   Financial Resource Strain:   . Difficulty of Paying Living Expenses:   Food Insecurity:   . Worried About Charity fundraiser in the Last Year:   . Arboriculturist in the Last Year:   Transportation Needs:   . Film/video editor (Medical):   Marland Kitchen Lack of Transportation (Non-Medical):   Physical Activity:   . Days of Exercise per Week:   . Minutes of Exercise per Session:   Stress:   . Feeling of Stress :   Social Connections:   . Frequency of Communication with Friends and Family:   . Frequency of Social Gatherings with Friends and Family:   . Attends Religious Services:   . Active Member of Clubs or Organizations:   . Attends Archivist Meetings:   Marland Kitchen Marital Status:   Intimate Partner Violence:   . Fear of Current or Ex-Partner:   . Emotionally Abused:   Marland Kitchen Physically Abused:   . Sexually Abused:     Outpatient Medications Prior to Visit  Medication Sig Dispense Refill  . Multiple Vitamins-Minerals (MULTIVITAMIN ADULT PO) Take 1 tablet by mouth daily.    . Omega-3 Fatty Acids (FISH OIL) 1000 MG CAPS Take by mouth.    Marland Kitchen OVER THE COUNTER MEDICATION Calcium    . Probiotic Product (PROBIOTIC DAILY PO) Take 1 tablet by mouth daily.    . sodium chloride (OCEAN) 0.65 % SOLN nasal spray Place 1 spray into both nostrils as needed for congestion.    Marland Kitchen albuterol (PROVENTIL) (2.5 MG/3ML) 0.083% nebulizer solution Take 3 mLs (2.5 mg total) by nebulization every 6 (six) hours as needed for wheezing or shortness of breath. (Patient not taking: Reported on 07/10/2019) 75 mL 0  . albuterol (VENTOLIN HFA) 108 (90 Base) MCG/ACT inhaler Inhale 2 puffs into the lungs every 6 (six) hours as needed for wheezing or shortness of breath. (Patient not taking: Reported on 07/10/2019) 1 Inhaler 6  . methylPREDNISolone (MEDROL) 4 MG tablet 5 tab po day 1, 4 tab po day 2, 3 tab po day 3, 2 tab po day 4 and 1 tab po day 5. (Patient not taking: Reported on 08/23/2019) 15 tablet  0   No facility-administered medications prior to visit.    Allergies  Allergen Reactions  . Amoxicillin Shortness Of Breath    Doesn't remember rxn  . Aspirin Shortness Of Breath    "irritated  stomach" Ibuprofen and Naproxen do not agree with her either  . Codeine Hives and Shortness Of Breath    Does not take percocet or hydrocodone; tylenol only  . Latex Shortness Of Breath and Itching  . Other Shortness Of Breath    peanuts  . Sulfa Antibiotics Hives and Shortness Of Breath  . Sulfamethoxazole-Trimethoprim Shortness Of Breath  . Sulfonamide Derivatives Shortness Of Breath and Itching  . Sulphadimidine Sodium [Sulfamethazine Sodium] Shortness Of Breath    Added to food for preservative  . Ciprofloxacin Other (See Comments)    REACTION: SOB, "Tightness in head" REACTION: SOB, "Tightness in head" Syncope   . Azithromycin Other (See Comments)    Faintness  . Carisoprodol   . Clarithromycin     "Doesn't agree with me"  . Epinephrine     Heart racing  . Fenofibrate Other (See Comments)    Leg cramps, body aches   . Metronidazole Other (See Comments)    "cannot tolerate"  . Nsaids   . Penicillins     Doesn't remember reaction  . Tolmetin   . Omeprazole Palpitations    Review of Systems  Constitutional: Positive for malaise/fatigue. Negative for fever.  HENT: Negative for congestion.   Eyes: Negative for blurred vision.  Respiratory: Negative for shortness of breath.   Cardiovascular: Negative for chest pain, palpitations and leg swelling.  Gastrointestinal: Negative for abdominal pain, blood in stool and nausea.  Genitourinary: Negative for dysuria and frequency.  Musculoskeletal: Positive for joint pain and myalgias. Negative for falls.  Skin: Negative for rash.  Neurological: Negative for dizziness, loss of consciousness and headaches.  Endo/Heme/Allergies: Negative for environmental allergies.  Psychiatric/Behavioral: Positive for depression and memory loss.  Negative for hallucinations, substance abuse and suicidal ideas. The patient is nervous/anxious and has insomnia.        Objective:    Physical Exam unable to obtain via phone visit.   There were no vitals taken for this visit. Wt Readings from Last 3 Encounters:  08/11/19 106 lb 12.8 oz (48.4 kg)  07/10/19 107 lb (48.5 kg)  06/20/19 105 lb (47.6 kg)    Diabetic Foot Exam - Simple   No data filed     Lab Results  Component Value Date   WBC 5.4 08/14/2019   HGB 13.8 08/14/2019   HCT 40.2 08/14/2019   PLT 295.0 08/14/2019   GLUCOSE 72 08/14/2019   CHOL 199 08/14/2019   TRIG 82.0 08/14/2019   HDL 58.10 08/14/2019   LDLDIRECT 143.0 01/17/2019   LDLCALC 125 (H) 08/14/2019   ALT 13 08/14/2019   AST 15 08/14/2019   NA 140 08/14/2019   K 4.0 08/14/2019   CL 103 08/14/2019   CREATININE 0.62 08/14/2019   BUN 17 08/14/2019   CO2 29 08/14/2019   TSH 2.27 08/14/2019   HGBA1C 5.0 08/14/2019    Lab Results  Component Value Date   TSH 2.27 08/14/2019   Lab Results  Component Value Date   WBC 5.4 08/14/2019   HGB 13.8 08/14/2019   HCT 40.2 08/14/2019   MCV 98.4 08/14/2019   PLT 295.0 08/14/2019   Lab Results  Component Value Date   NA 140 08/14/2019   K 4.0 08/14/2019   CO2 29 08/14/2019   GLUCOSE 72 08/14/2019   BUN 17 08/14/2019   CREATININE 0.62 08/14/2019   BILITOT 1.0 08/14/2019   ALKPHOS 86 08/14/2019   AST 15 08/14/2019   ALT 13 08/14/2019   PROT 7.0 08/14/2019   ALBUMIN  4.5 08/14/2019   CALCIUM 9.6 08/14/2019   ANIONGAP 10 01/09/2019   GFR 100.19 08/14/2019   Lab Results  Component Value Date   CHOL 199 08/14/2019   Lab Results  Component Value Date   HDL 58.10 08/14/2019   Lab Results  Component Value Date   LDLCALC 125 (H) 08/14/2019   Lab Results  Component Value Date   TRIG 82.0 08/14/2019   Lab Results  Component Value Date   CHOLHDL 3 08/14/2019   Lab Results  Component Value Date   HGBA1C 5.0 08/14/2019       Assessment  & Plan:   Problem List Items Addressed This Visit    Anxiety - Primary    Patient is worsening and is how having panic attacks, difficulty concentrating, fatigue. 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 She is forgetful and has trouble with malaise and myalgias. Her last day of work was 08/23/19 so we will excuse her from work from 4/29 and she will need to be off work for next 6-12 weeks as her medications take effect and counseling to begin. Is started on Sertraline 25 mg daily x 7 days and then increase to 50 mg daily. Given Hydroyzine to use prn for anxiety and sleep and she is referred to [facility] Spent 40 minutes counseling the patient, discussing treatment options and prescribing plan of care.       Relevant Medications   sertraline (ZOLOFT) 25 MG tablet   hydrOXYzine (ATARAX/VISTARIL) 10 MG tablet   Other Relevant Orders   Ambulatory referral to [facility]      I have discontinued Analyce Neth's albuterol, albuterol, and methylPREDNISolone. I am also having her start on sertraline and hydrOXYzine. Additionally, I am having her maintain her Probiotic Product (PROBIOTIC DAILY PO), OVER THE COUNTER MEDICATION, Multiple Vitamins-Minerals (MULTIVITAMIN ADULT PO), Fish Oil, and sodium chloride.  Meds ordered this encounter  Medications  . sertraline (ZOLOFT) 25 MG tablet    Sig: Take 0.5-1 tablets (12.5-25 mg total) by mouth daily.    Dispense:  30 tablet    Refill:  3  . hydrOXYzine (ATARAX/VISTARIL) 10 MG tablet    Sig: Take 1-2 tablets (10-20 mg total) by mouth every 8 (eight) hours as needed for anxiety.    Dispense:  60 tablet    Refill:  2      I discussed the assessment and treatment plan with the patient. The patient was provided an opportunity to ask questions and all were answered. The patient agreed with the plan and demonstrated an understanding of the instructions.   The patient was advised to call back or seek an  in-person evaluation if the symptoms worsen or if the condition fails to improve as anticipated.  I provided 40 minutes of non-face-to-face time during this encounter.   Penni Homans, MD

## 2019-08-28 ENCOUNTER — Ambulatory Visit (INDEPENDENT_AMBULATORY_CARE_PROVIDER_SITE_OTHER): Payer: 59 | Admitting: Psychology

## 2019-08-28 ENCOUNTER — Encounter: Payer: Self-pay | Admitting: Neurology

## 2019-08-28 DIAGNOSIS — F4322 Adjustment disorder with anxiety: Secondary | ICD-10-CM

## 2019-08-28 NOTE — Telephone Encounter (Signed)
Pt states she was referral to [facility]. Patient sates she doesn't want to go on [address] she wants to go to Neurology - [facility], [address], [place], [place] [address].   She also states that she wants to make sure her FMLA she gave on  04/30 is being worked on. Please advise

## 2019-08-28 NOTE — Telephone Encounter (Signed)
Patient notified referral was sent, but as reviewing chart I saw that she refused appt.  She stated that she went to an office off of [address] and did not want to go back.  Advised her that it was a different office and that she will call back to schedule.  Also advised that we was still working on her paperwork.

## 2019-08-30 ENCOUNTER — Other Ambulatory Visit: Payer: Self-pay

## 2019-08-30 ENCOUNTER — Encounter: Payer: Self-pay | Admitting: Physical Therapy

## 2019-08-30 ENCOUNTER — Ambulatory Visit: Payer: 59 | Attending: Family Medicine | Admitting: Physical Therapy

## 2019-08-30 DIAGNOSIS — M6281 Muscle weakness (generalized): Secondary | ICD-10-CM | POA: Diagnosis present

## 2019-08-30 DIAGNOSIS — M25641 Stiffness of right hand, not elsewhere classified: Secondary | ICD-10-CM | POA: Diagnosis present

## 2019-08-30 DIAGNOSIS — R29898 Other symptoms and signs involving the musculoskeletal system: Secondary | ICD-10-CM | POA: Insufficient documentation

## 2019-08-30 DIAGNOSIS — M542 Cervicalgia: Secondary | ICD-10-CM | POA: Insufficient documentation

## 2019-08-30 NOTE — Therapy (Signed)
[facility] [address]  [name], [place], [address] Phone: [phone]   Fax:  [phone]  Physical Therapy Treatment  Patient Details  Name: [name] MRN: [id number] Date of Birth: 01-23-1966 Referring Provider (PT): Penni Homans, MD   Encounter Date: 08/30/2019  PT End of Session - 08/30/19 1710    Visit Number  2    Number of Visits  7    Date for PT Re-Evaluation  10/04/19    Authorization Type  [employer]    PT Start Time  1618    PT Stop Time  1704    PT Time Calculation (min)  46 min    Activity Tolerance  Patient tolerated treatment well    Behavior During Therapy  [facility] for tasks assessed/performed;Anxious       Past Medical History:  Diagnosis Date  . Allergic rhinitis   . Anemia   . Anxiety   . Asthma   . Contact dermatitis and eczema   . Diverticulitis    CT Scan  . Esophageal reflux   . H. pylori infection   . Hyperlipidemia, mixed   . IBS (irritable bowel syndrome)   . IC (interstitial cystitis)   . Internal hemorrhoids   . Lumbago   . Mitral valve disorders(424.0)   . Osteoarthritis 07/08/2015  . Post concussive syndrome   . Seasonal and perennial allergic rhinitis 01/20/2015    Past Surgical History:  Procedure Laterality Date  . BARTHOLIN CYST MARSUPIALIZATION N/A 12/18/2013   Procedure: BARTHOLIN CYST MARSUPIALIZATION WITH BIOPSY;  Surgeon: Lovenia Kim, MD;  Location: [facility];  Service: Gynecology;  Laterality: N/A;  . CESAREAN SECTION    . DILITATION & CURRETTAGE/HYSTROSCOPY WITH NOVASURE ABLATION N/A 10/14/2012   Procedure: DILATATION & CURETTAGE/HYSTEROSCOPY WITH NOVASURE ABLATION;  Surgeon: Lovenia Kim, MD;  Location: [facility];  Service: Gynecology;  Laterality: N/A;  . MOUTH SURGERY    . SINUS SURGERY WITH INSTATRAK      There were no vitals filed for this visit.  Subjective Assessment - 08/30/19 1620    Subjective  HEP going well. Noticing good improvement in  finger strength with use of putty. Having a headache radiating over her L eye this afternoon.    Pertinent History  post concussive syndrome, lumbago, mitral valve disorder, HLD, asthma, anxiety, anemia    Diagnostic tests  08/11/19 cervical xray: Multilevel degenerative change in the cervical spine. No evidence of acute osseous abnormality. Straightening of normal lordosis, also seen on 2017 cervical spine CT.    Patient Stated Goals  "make sure my alignment is not off and work on bath strength"    Currently in Pain?  Yes    Pain Score  5     Pain Location  Shoulder    Pain Orientation  Left;Posterior    Pain Descriptors / Indicators  Aching    Pain Type  Acute pain                       [facility] Adult PT Treatment/Exercise - 08/30/19 0001      Exercises   Exercises  Neck;Hand;Shoulder      Neck Exercises: Machines for Strengthening   UBE (Upper Arm Bike)  L1 x 65min forward/3 min back   pillow behind back     Neck Exercises: Seated   Neck Retraction  10 reps    Neck Retraction Limitations  cueing for form    Other Seated  Exercise  scapular retraction 10x3'"      Shoulder Exercises: Standing   Extension  Strengthening;Both;10 reps;Theraband    Theraband Level (Shoulder Extension)  Level 2 (Red)    Extension Limitations  cues to maintain elbow straight    Row  Strengthening;Both;10 reps;Theraband    Theraband Level (Shoulder Row)  Level 2 (Red)    Row Limitations  2x10; cues for proper scapular positioning      Manual Therapy   Manual Therapy  Soft tissue mobilization;Myofascial release    Manual therapy comments  sitting    Soft tissue mobilization  STM to L UT, LS, scalenes, cervical paraspinals, suboccipitals-TTP and palpable trigger point evident over L proximal UT and LS   report of relief of HA after MT   Myofascial Release  manual TPR to L UT and LS      Neck Exercises: Stretches   Upper Trapezius Stretch  Right;Left;1 rep;30 seconds    Upper Trapezius  Stretch Limitations  sitting on hands    Levator Stretch  Right;Left;1 rep;30 seconds    Levator Stretch Limitations  sitting on hands             PT Education - 08/30/19 1707    Education Details  update to HEP    Person(s) Educated  Patient    Methods  Explanation;Demonstration;Tactile cues;Verbal cues;Handout    Comprehension  Verbalized understanding;Returned demonstration       PT Short Term Goals - 08/30/19 1713      PT SHORT TERM GOAL #1   Title  Independent with initial HEP.    Time  3    Period  Weeks    Status  On-going    Target Date  09/13/19        PT Long Term Goals - 08/30/19 1713      PT LONG TERM GOAL #1   Title  Independent with advanced HEP.    Time  6    Period  Weeks    Status  On-going      PT LONG TERM GOAL #2   Title  Patient to demonstrate cervical AROM WFL and without pain limiting.    Time  6    Period  Weeks    Status  On-going      PT LONG TERM GOAL #3   Title  Patient to demonstrate B shoulder strength >/=4+/5.    Time  6    Period  Weeks    Status  On-going      PT LONG TERM GOAL #4   Title  Patient to demonstrate R full handed/3 point pinch grip strength symmetrical to opposite UE.    Time  6    Period  Weeks    Status  On-going      PT LONG TERM GOAL #5   Title  Patient to demonstrate R 4th digit AROM WFL in order to make a closed fist.    Time  6    Period  Weeks    Status  On-going      PT LONG TERM GOAL #6   Title  Patient to return to fitness regimen without pain.    Time  6    Period  Weeks    Status  On-going            Plan - 08/30/19 1710    Clinical Impression Statement  Patient reporting good improvement in finger strength with use of putty. Reviewed cervical ther-ex from HEP with intermittent  cues for proper form. Patient demonstrated good understanding and carryover of scapular retraction and depression with progressive periscapular strengthening. Ended session with STM and TPR to L UT, LS,  scalenes, cervical paraspinals, suboccipitals. Patient with c/o tenderness and palpable trigger points most evident in L UT and LS. Patient also with report of relief of HA that she had at beginning of session after MT. Updated HEP with exercises that were well-tolerated today. Patient reported understanding.    Comorbidities  post concussive syndrome, lumbago, mitral valve disorder, HLD, asthma, anxiety, anemia    PT Treatment/Interventions  ADLs/Self Care Home Management;Cryotherapy;Electrical Stimulation;Moist Heat;Traction;Therapeutic exercise;Therapeutic activities;Functional mobility training;Ultrasound;Neuromuscular re-education;Patient/family education;Manual techniques;Taping;Energy conservation;Vasopneumatic Device;Dry needling;Passive range of motion    PT Next Visit Plan  STM; progress cervical and R index ROM    Consulted and Agree with Plan of Care  Patient       Patient will benefit from skilled therapeutic intervention in order to improve the following deficits and impairments:  Decreased activity tolerance, Decreased strength, Increased fascial restricitons, Impaired UE functional use, Pain, Increased muscle spasms, Decreased range of motion, Improper body mechanics, Postural dysfunction, Impaired flexibility  Visit Diagnosis: Cervicalgia  Finger joint stiff, right  Muscle weakness (generalized)  Other symptoms and signs involving the musculoskeletal system     Problem List Patient Active Problem List   Diagnosis Date Noted  . Educated about COVID-19 virus infection 06/21/2019  . Insomnia 02/19/2019  . Dysuria 01/13/2019  . Anxiety 01/13/2019  . Cough 07/25/2018  . Perimenopausal 01/02/2018  . History of Helicobacter pylori infection 12/28/2017  . Nocturia 07/19/2016  . Rash and nonspecific skin eruption 03/21/2016  . Syncope 01/08/2016  . Osteoarthritis 07/08/2015  . Hematuria 01/20/2015  . Hyperlipidemia, mild 01/20/2015  . Seasonal and perennial allergic  rhinitis 01/20/2015  . Fatigue 10/24/2014  . Sinusitis 10/11/2014  . Preventative health care 06/03/2014  . Diverticulosis 07/01/2011  . Menopausal state 03/25/2011  . Fibroids 10/24/2010  . Anemia 03/25/2010  . IRRITABLE BOWEL SYNDROME 03/25/2010  . Mitral valve disorder 07/09/2007  . Allergic asthma, mild intermittent, uncomplicated 71/21/9758  . ESOPHAGEAL REFLUX 07/09/2007  . DERMATITIS 07/09/2007  . LOW BACK PAIN 12/14/2006     Janene Harvey, PT, DPT 08/30/19 5:15 PM   [facility] [address]  [place], [place], [address] Phone: [phone]   Fax:  [phone]  Name: Hollie Wojahn MRN: [id number] Date of Birth: 1966-01-08

## 2019-08-31 ENCOUNTER — Telehealth: Payer: Self-pay

## 2019-08-31 ENCOUNTER — Telehealth: Payer: Self-pay | Admitting: Family Medicine

## 2019-08-31 NOTE — Telephone Encounter (Signed)
Patient would like to speak with the nurse or DR. Charlett Blake about her [facility] referral that was placed. Per the patient she had a bad experience with the office when she was scheduling. Per the patient she would like a call back as soon as possible at [phone]

## 2019-08-31 NOTE — Telephone Encounter (Signed)
Patient stated that she called back to make an appt at neurologist and they gave her later appointment for 7/12.  She would like a sooner appointment while she is out on leave.

## 2019-08-31 NOTE — Telephone Encounter (Signed)
Form placed in provider folder for completion

## 2019-08-31 NOTE — Telephone Encounter (Signed)
Pt stated that FMLA states that he needs make sure her extension paperwork is filled out correctly. They said it needs to say Continuous & to have exact dates  Not just  12 weeks. Please advise

## 2019-09-04 NOTE — Telephone Encounter (Signed)
Spoke with patient on Friday and advised her that they were unable to get her in on early and they did put her on the cancellation list.  Also that appt is within her 12 weeks fmla.

## 2019-09-05 ENCOUNTER — Ambulatory Visit: Payer: 59 | Admitting: Physical Therapy

## 2019-09-05 ENCOUNTER — Encounter: Payer: Self-pay | Admitting: Physical Therapy

## 2019-09-05 ENCOUNTER — Other Ambulatory Visit: Payer: Self-pay

## 2019-09-05 DIAGNOSIS — R29898 Other symptoms and signs involving the musculoskeletal system: Secondary | ICD-10-CM

## 2019-09-05 DIAGNOSIS — M542 Cervicalgia: Secondary | ICD-10-CM | POA: Diagnosis not present

## 2019-09-05 DIAGNOSIS — M6281 Muscle weakness (generalized): Secondary | ICD-10-CM

## 2019-09-05 DIAGNOSIS — M25641 Stiffness of right hand, not elsewhere classified: Secondary | ICD-10-CM

## 2019-09-05 NOTE — Therapy (Signed)
[facility] [address]  [place], [place], [address] Phone: [phone]   Fax:  [phone]  Physical Therapy Treatment  Patient Details  Name: [name] MRN: [id number] Date of Birth: 1965-07-15 Referring Provider (PT): Penni Homans, MD   Encounter Date: 09/05/2019  PT End of Session - 09/05/19 1758    Visit Number  3    Number of Visits  7    Date for PT Re-Evaluation  10/04/19    Authorization Type  [employer]    PT Start Time  1625   pt late   PT Stop Time  1702    PT Time Calculation (min)  37 min    Activity Tolerance  Patient tolerated treatment well    Behavior During Therapy  [facility] for tasks assessed/performed;Anxious       Past Medical History:  Diagnosis Date  . Allergic rhinitis   . Anemia   . Anxiety   . Asthma   . Contact dermatitis and eczema   . Diverticulitis    CT Scan  . Esophageal reflux   . H. pylori infection   . Hyperlipidemia, mixed   . IBS (irritable bowel syndrome)   . IC (interstitial cystitis)   . Internal hemorrhoids   . Lumbago   . Mitral valve disorders(424.0)   . Osteoarthritis 07/08/2015  . Post concussive syndrome   . Seasonal and perennial allergic rhinitis 01/20/2015    Past Surgical History:  Procedure Laterality Date  . BARTHOLIN CYST MARSUPIALIZATION N/A 12/18/2013   Procedure: BARTHOLIN CYST MARSUPIALIZATION WITH BIOPSY;  Surgeon: Lovenia Kim, MD;  Location: [facility];  Service: Gynecology;  Laterality: N/A;  . CESAREAN SECTION    . DILITATION & CURRETTAGE/HYSTROSCOPY WITH NOVASURE ABLATION N/A 10/14/2012   Procedure: DILATATION & CURETTAGE/HYSTEROSCOPY WITH NOVASURE ABLATION;  Surgeon: Lovenia Kim, MD;  Location: [facility];  Service: Gynecology;  Laterality: N/A;  . MOUTH SURGERY    . SINUS SURGERY WITH INSTATRAK      There were no vitals filed for this visit.  Subjective Assessment - 09/05/19 1626    Subjective  Feeling tired d/t having her final  exam last night.    Pertinent History  post concussive syndrome, lumbago, mitral valve disorder, HLD, asthma, anxiety, anemia    Diagnostic tests  08/11/19 cervical xray: Multilevel degenerative change in the cervical spine. No evidence of acute osseous abnormality. Straightening of normal lordosis, also seen on 2017 cervical spine CT.    Patient Stated Goals  "make sure my alignment is not off and work on bath strength"    Currently in Pain?  No/denies                       [facility] Adult PT Treatment/Exercise - 09/05/19 0001      Neck Exercises: Machines for Strengthening   UBE (Upper Arm Bike)  L1 x 69min forward/3 min back   pillow behind back     Neck Exercises: Supine   Neck Retraction  10 reps    Neck Retraction Limitations  10x3" towel roll under head   limited ROM   Other Supine Exercise  B shoulder ER with yellow TB x10   c/o "tension" in the R posterior shoulder and neck   Other Supine Exercise  B shoulder horizontal abduction with yellow TB x10   cueing to depress and retract scapulae     Manual Therapy   Manual Therapy  Soft tissue mobilization;Myofascial  release;Manual Traction    Manual therapy comments  supine    Soft tissue mobilization  STM to R UT, LS, scalenes, cervical paraspinals    Myofascial Release  B suboccipital release 2x30" and manual TPR to R scalenes and UT             PT Education - 09/05/19 1758    Education Details  re-administered HEP d/t technical difficulties last session    Person(s) Educated  Patient    Methods  Explanation;Demonstration;Tactile cues;Verbal cues;Handout    Comprehension  Verbalized understanding;Returned demonstration       PT Short Term Goals - 09/05/19 1801      PT SHORT TERM GOAL #1   Title  Independent with initial HEP.    Time  3    Period  Weeks    Status  Achieved    Target Date  09/13/19        PT Long Term Goals - 08/30/19 1713      PT LONG TERM GOAL #1   Title  Independent with  advanced HEP.    Time  6    Period  Weeks    Status  On-going      PT LONG TERM GOAL #2   Title  Patient to demonstrate cervical AROM WFL and without pain limiting.    Time  6    Period  Weeks    Status  On-going      PT LONG TERM GOAL #3   Title  Patient to demonstrate B shoulder strength >/=4+/5.    Time  6    Period  Weeks    Status  On-going      PT LONG TERM GOAL #4   Title  Patient to demonstrate R full handed/3 point pinch grip strength symmetrical to opposite UE.    Time  6    Period  Weeks    Status  On-going      PT LONG TERM GOAL #5   Title  Patient to demonstrate R 4th digit AROM WFL in order to make a closed fist.    Time  6    Period  Weeks    Status  On-going      PT LONG TERM GOAL #6   Title  Patient to return to fitness regimen without pain.    Time  6    Period  Weeks    Status  On-going            Plan - 09/05/19 1759    Clinical Impression Statement  Patient without new complaints. Worked on supine postural correction and periscapular ther-ex today. Patient very responsive to cueing to depress and retract scapulae as well as corrective cues for proper form. Patient with intermittent report of tension in the neck and shoulders with today's exercises but denied pain. Initiated supine cervical retractions today with patient demonstrating slightly limited ROM compared to this exercise in sitting. Ended session with STM and manual TPR to R UT, LS, scalenes, and cervical paraspinals for relief of muscle tension. Patient reported relief at end of session.    Comorbidities  post concussive syndrome, lumbago, mitral valve disorder, HLD, asthma, anxiety, anemia    PT Treatment/Interventions  ADLs/Self Care Home Management;Cryotherapy;Electrical Stimulation;Moist Heat;Traction;Therapeutic exercise;Therapeutic activities;Functional mobility training;Ultrasound;Neuromuscular re-education;Patient/family education;Manual techniques;Taping;Energy  conservation;Vasopneumatic Device;Dry needling;Passive range of motion    PT Next Visit Plan  STM; progress cervical and R index ROM    Consulted and Agree with Plan of Care  Patient  Patient will benefit from skilled therapeutic intervention in order to improve the following deficits and impairments:  Decreased activity tolerance, Decreased strength, Increased fascial restricitons, Impaired UE functional use, Pain, Increased muscle spasms, Decreased range of motion, Improper body mechanics, Postural dysfunction, Impaired flexibility  Visit Diagnosis: Cervicalgia  Finger joint stiff, right  Muscle weakness (generalized)  Other symptoms and signs involving the musculoskeletal system     Problem List Patient Active Problem List   Diagnosis Date Noted  . Educated about COVID-19 virus infection 06/21/2019  . Insomnia 02/19/2019  . Dysuria 01/13/2019  . Anxiety 01/13/2019  . Cough 07/25/2018  . Perimenopausal 01/02/2018  . History of Helicobacter pylori infection 12/28/2017  . Nocturia 07/19/2016  . Rash and nonspecific skin eruption 03/21/2016  . Syncope 01/08/2016  . Osteoarthritis 07/08/2015  . Hematuria 01/20/2015  . Hyperlipidemia, mild 01/20/2015  . Seasonal and perennial allergic rhinitis 01/20/2015  . Fatigue 10/24/2014  . Sinusitis 10/11/2014  . Preventative health care 06/03/2014  . Diverticulosis 07/01/2011  . Menopausal state 03/25/2011  . Fibroids 10/24/2010  . Anemia 03/25/2010  . IRRITABLE BOWEL SYNDROME 03/25/2010  . Mitral valve disorder 07/09/2007  . Allergic asthma, mild intermittent, uncomplicated 19/16/6060  . ESOPHAGEAL REFLUX 07/09/2007  . DERMATITIS 07/09/2007  . LOW BACK PAIN 12/14/2006     Janene Harvey, PT, DPT 09/05/19 6:03 PM   [facility] [address]  [place], [place], [address] Phone: [phone]   Fax:  [phone]  Name: [name] MRN:  [id number] Date of Birth: [birth year]

## 2019-09-06 ENCOUNTER — Encounter: Payer: Self-pay | Admitting: *Deleted

## 2019-09-06 NOTE — Telephone Encounter (Addendum)
Patient had HR send in new paperwork.  Holding paperwork to give to provider to to upon arrival tomorrow.  FMLA phone number [phone]

## 2019-09-06 NOTE — Telephone Encounter (Signed)
Paperwork misplaced.  Lmom for patient to call back.  Need to get new paperwork.

## 2019-09-07 IMAGING — MG 2D DIGITAL SCREENING BILATERAL MAMMOGRAM WITH CAD AND ADJUNCT TO
9 of 12 series · 9 of 28 positions shown · non-contrast
Comparison: Previous exam(s).

CLINICAL DATA: Screening.

EXAM:
2D DIGITAL SCREENING BILATERAL MAMMOGRAM WITH CAD AND ADJUNCT TOMO

[L MLO]
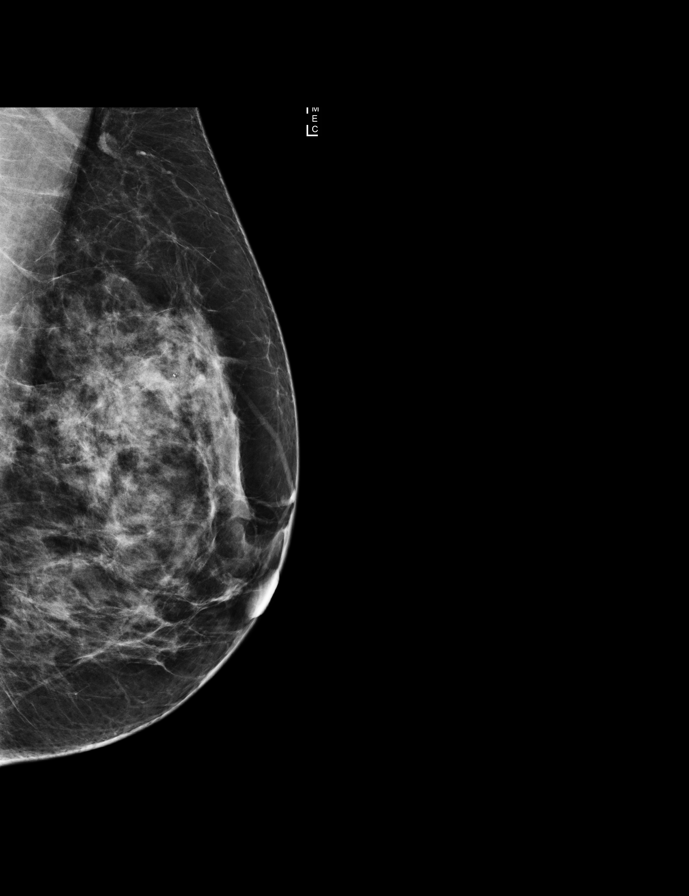

[R MLO synth-2D]
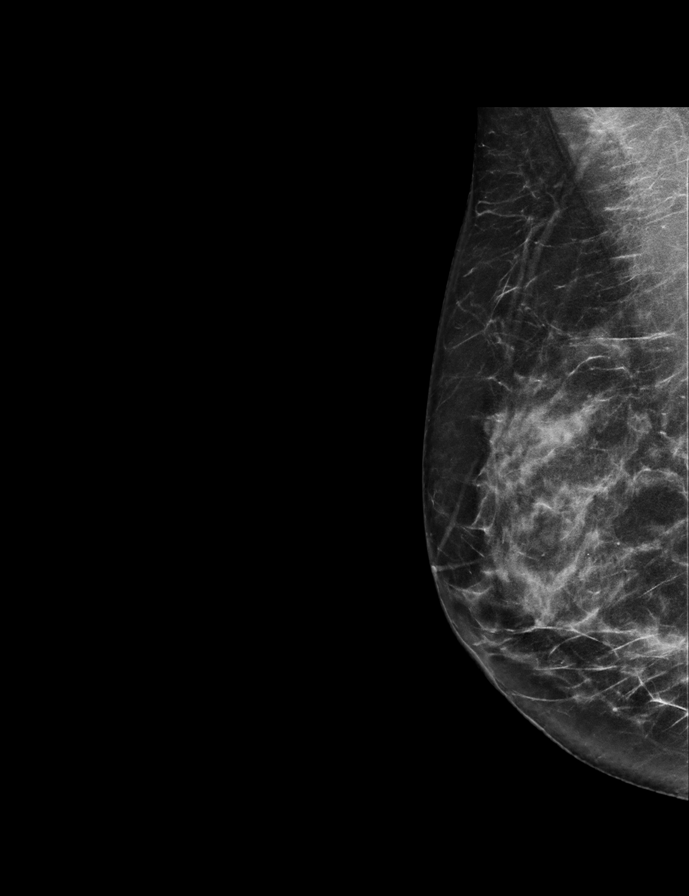

[L CC]
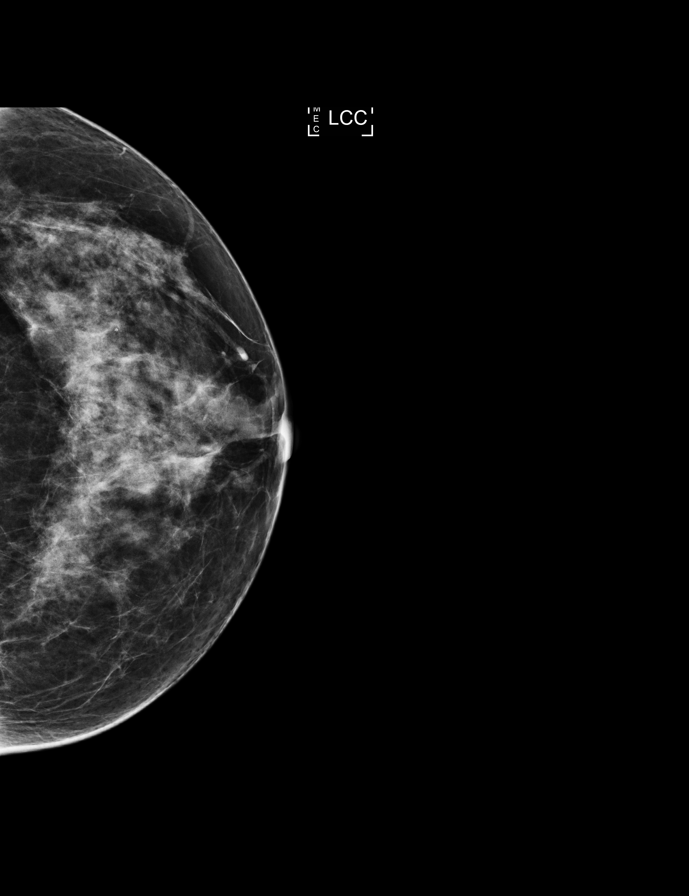

[L MLO synth-2D]
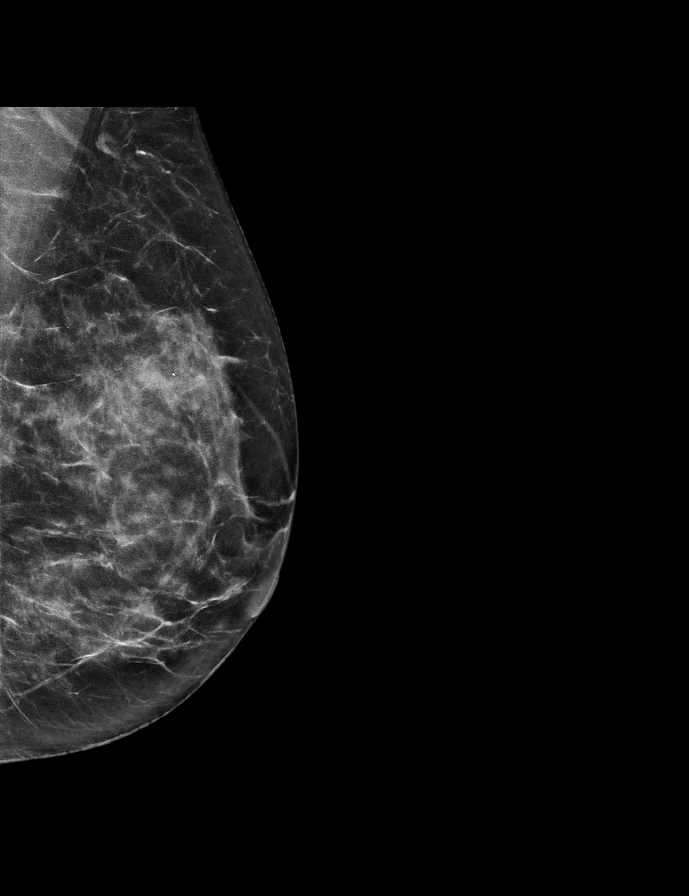

[R CC]
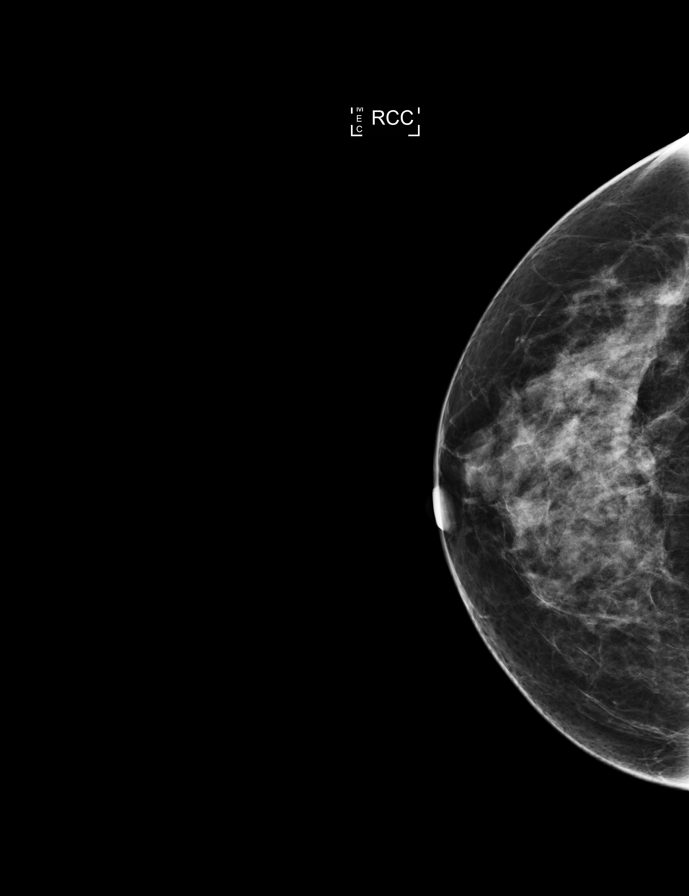

[R CC synth-2D]
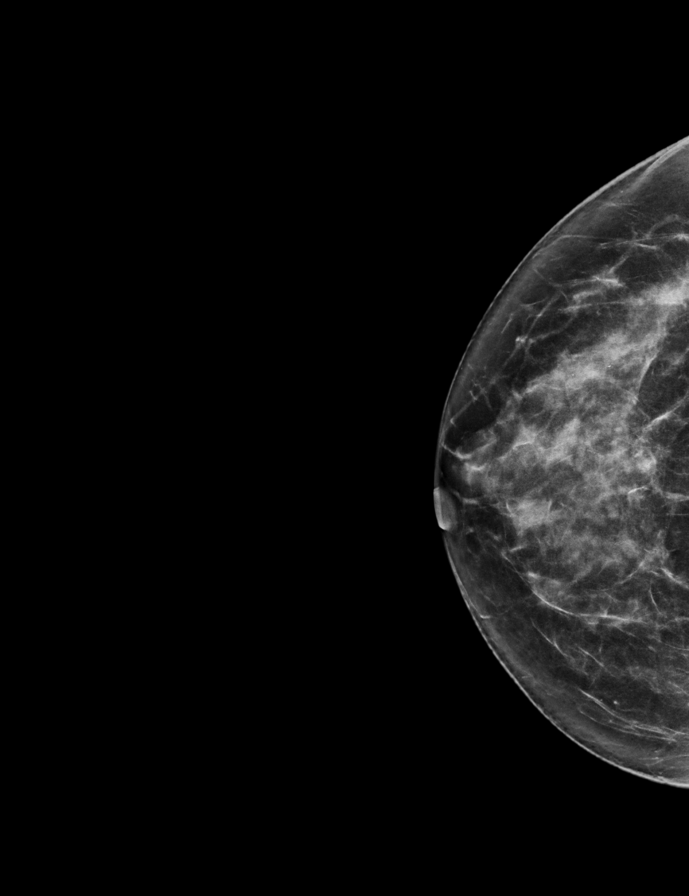

[L CC synth-2D]
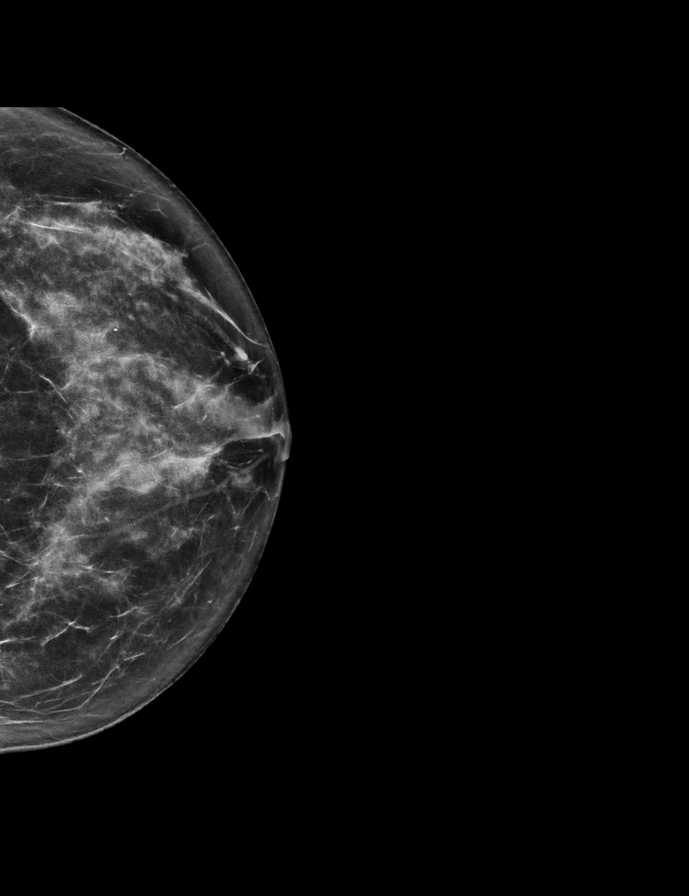

[R MLO]
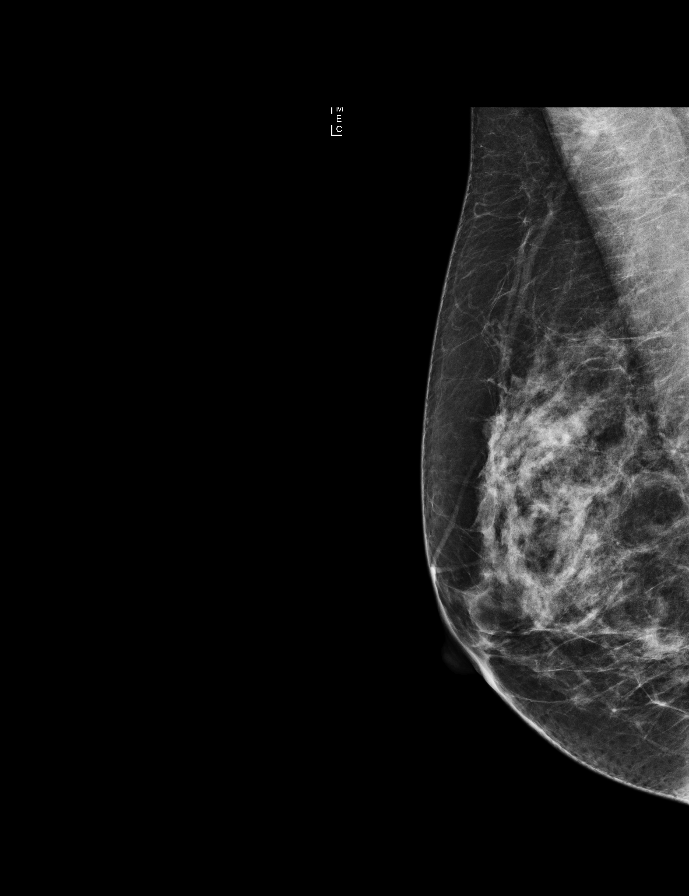

[L MLO tomo · tomo slice 31/62.0]
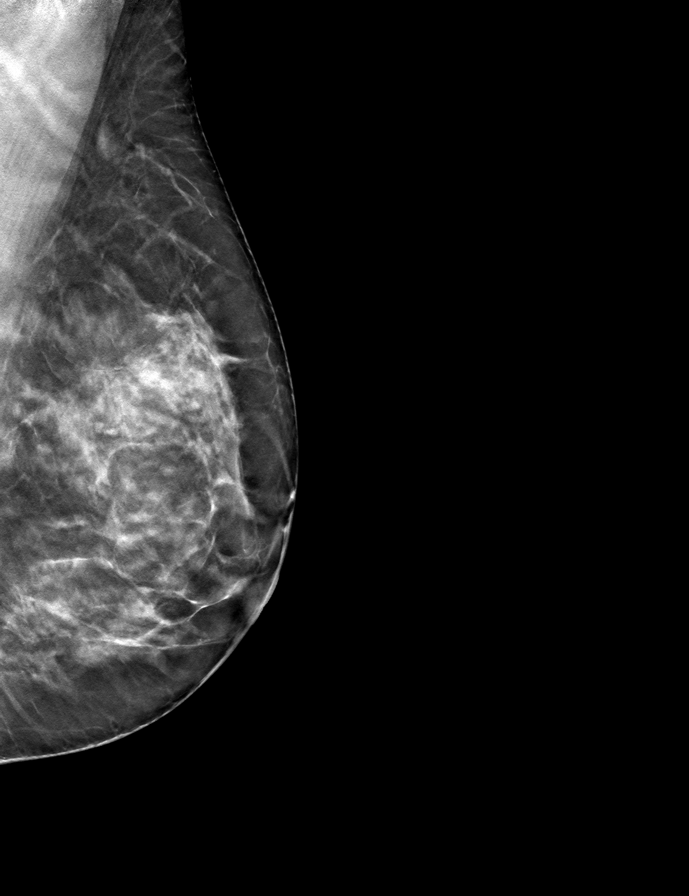

[9 of 28 positions shown; findings below may reference images not displayed]

ACR Breast Density Category c: The breast tissue is heterogeneously
dense, which may obscure small masses.
FINDINGS: There are no findings suspicious for malignancy. Images were
processed with CAD.
IMPRESSION: No mammographic evidence of malignancy. A result letter of this
screening mammogram will be mailed directly to the patient.

RECOMMENDATION:
Screening mammogram in one year. (Code:[id number])

BI-RADS CATEGORY  1: Negative.

## 2019-09-07 NOTE — Telephone Encounter (Signed)
Dr Charlett Blake will give FMLA form to you when she finishes.  Please call patient and fax. Please leave it on my desk after you fax.

## 2019-09-07 NOTE — Telephone Encounter (Signed)
Patient advised FMLA form was faxed, she will like to come in for a copy. Copy left up front for pick up.

## 2019-09-08 ENCOUNTER — Ambulatory Visit (INDEPENDENT_AMBULATORY_CARE_PROVIDER_SITE_OTHER): Payer: 59 | Admitting: Psychology

## 2019-09-08 DIAGNOSIS — F4322 Adjustment disorder with anxiety: Secondary | ICD-10-CM | POA: Diagnosis not present

## 2019-09-12 ENCOUNTER — Encounter: Payer: Self-pay | Admitting: Family Medicine

## 2019-09-12 ENCOUNTER — Ambulatory Visit (INDEPENDENT_AMBULATORY_CARE_PROVIDER_SITE_OTHER): Payer: 59 | Admitting: Family Medicine

## 2019-09-12 ENCOUNTER — Other Ambulatory Visit: Payer: Self-pay

## 2019-09-12 ENCOUNTER — Ambulatory Visit: Payer: 59

## 2019-09-12 VITALS — BP 110/70 | HR 86 | Temp 98.3°F | Resp 12 | Ht 59.0 in | Wt 103.2 lb

## 2019-09-12 DIAGNOSIS — Z0000 Encounter for general adult medical examination without abnormal findings: Secondary | ICD-10-CM | POA: Diagnosis not present

## 2019-09-12 DIAGNOSIS — K219 Gastro-esophageal reflux disease without esophagitis: Secondary | ICD-10-CM | POA: Diagnosis not present

## 2019-09-12 DIAGNOSIS — R29898 Other symptoms and signs involving the musculoskeletal system: Secondary | ICD-10-CM

## 2019-09-12 DIAGNOSIS — R3989 Other symptoms and signs involving the genitourinary system: Secondary | ICD-10-CM | POA: Diagnosis not present

## 2019-09-12 DIAGNOSIS — M6281 Muscle weakness (generalized): Secondary | ICD-10-CM

## 2019-09-12 DIAGNOSIS — J0100 Acute maxillary sinusitis, unspecified: Secondary | ICD-10-CM

## 2019-09-12 DIAGNOSIS — M25641 Stiffness of right hand, not elsewhere classified: Secondary | ICD-10-CM

## 2019-09-12 DIAGNOSIS — E785 Hyperlipidemia, unspecified: Secondary | ICD-10-CM

## 2019-09-12 DIAGNOSIS — F419 Anxiety disorder, unspecified: Secondary | ICD-10-CM | POA: Diagnosis not present

## 2019-09-12 DIAGNOSIS — M542 Cervicalgia: Secondary | ICD-10-CM | POA: Diagnosis not present

## 2019-09-12 DIAGNOSIS — R351 Nocturia: Secondary | ICD-10-CM

## 2019-09-12 LAB — POC URINALSYSI DIPSTICK (AUTOMATED)
Bilirubin, UA: NEGATIVE
Clarity, UA: NEGATIVE
Glucose, UA: NEGATIVE
Ketones, UA: NEGATIVE
Leukocytes, UA: NEGATIVE
Nitrite, UA: NEGATIVE
Protein, UA: NEGATIVE
Spec Grav, UA: 1.01 (ref 1.010–1.025)
Urobilinogen, UA: 0.2 E.U./dL
pH, UA: 7 (ref 5.0–8.0)

## 2019-09-12 NOTE — Therapy (Signed)
[facility] [address]  [place], [place], [address] Phone: [phone]   Fax:  [phone]  Physical Therapy Treatment  Patient Details  Name: [name] MRN: [id number] Date of Birth: November 24, 1965 Referring Provider (PT): Penni Homans, MD   Encounter Date: 09/12/2019  PT End of Session - 09/12/19 1629    Visit Number  4    Number of Visits  7    Date for PT Re-Evaluation  10/04/19    Authorization Type  UHC    PT Start Time  4784    PT Stop Time  1704    PT Time Calculation (min)  41 min    Activity Tolerance  Patient tolerated treatment well    Behavior During Therapy  [facility] for tasks assessed/performed;Anxious       Past Medical History:  Diagnosis Date  . Allergic rhinitis   . Anemia   . Anxiety   . Asthma   . Contact dermatitis and eczema   . Diverticulitis    CT Scan  . Esophageal reflux   . H. pylori infection   . Hyperlipidemia, mixed   . IBS (irritable bowel syndrome)   . IC (interstitial cystitis)   . Internal hemorrhoids   . Lumbago   . Mitral valve disorders(424.0)   . Osteoarthritis 07/08/2015  . Post concussive syndrome   . Seasonal and perennial allergic rhinitis 01/20/2015    Past Surgical History:  Procedure Laterality Date  . BARTHOLIN CYST MARSUPIALIZATION N/A 12/18/2013   Procedure: BARTHOLIN CYST MARSUPIALIZATION WITH BIOPSY;  Surgeon: Lovenia Kim, MD;  Location: [facility];  Service: Gynecology;  Laterality: N/A;  . CESAREAN SECTION    . DILITATION & CURRETTAGE/HYSTROSCOPY WITH NOVASURE ABLATION N/A 10/14/2012   Procedure: DILATATION & CURETTAGE/HYSTEROSCOPY WITH NOVASURE ABLATION;  Surgeon: Lovenia Kim, MD;  Location: [facility];  Service: Gynecology;  Laterality: N/A;  . MOUTH SURGERY    . SINUS SURGERY WITH INSTATRAK      There were no vitals filed for this visit.  Subjective Assessment - 09/12/19 1627    Subjective  Pt. doing ok.    Pertinent History  post  concussive syndrome, lumbago, mitral valve disorder, HLD, asthma, anxiety, anemia    Diagnostic tests  08/11/19 cervical xray: Multilevel degenerative change in the cervical spine. No evidence of acute osseous abnormality. Straightening of normal lordosis, also seen on 2017 cervical spine CT.    Patient Stated Goals  "make sure my alignment is not off and work on bath strength"    Currently in Pain?  Yes    Pain Score  3     Pain Location  Shoulder    Pain Orientation  Medial;Upper    Pain Descriptors / Indicators  Aching    Pain Type  Acute pain    Aggravating Factors   mornings    Pain Relieving Factors  moving around    Multiple Pain Sites  No                        [facility] Adult PT Treatment/Exercise - 09/12/19 0001      Neck Exercises: Machines for Strengthening   UBE (Upper Arm Bike)  L2.0 x 64min forward/3 min back    Cybex Row  B low row - 5# x 15 reps       Shoulder Exercises: Standing   Flexion  Both;10 reps;Theraband;Strengthening    Theraband Level (Shoulder Flexion)  Level 1 (  Yellow)    Flexion Limitations  0-90 dg;      Manual Therapy   Manual Therapy  Soft tissue mobilization;Myofascial release    Manual therapy comments  prone     Soft tissue mobilization  STM to B thoracic paraspinals, rhomboids, B UT, LS    Myofascial Release  TPR to L-sided rhomboids, R UT      Neck Exercises: Stretches   Upper Trapezius Stretch  Right;Left;1 rep;30 seconds    Upper Trapezius Stretch Limitations  hands anchored on table     Levator Stretch  Right;Left;1 rep;30 seconds    Levator Stretch Limitations  hands anchored on table                PT Short Term Goals - 09/05/19 1801      PT SHORT TERM GOAL #1   Title  Independent with initial HEP.    Time  3    Period  Weeks    Status  Achieved    Target Date  09/13/19        PT Long Term Goals - 08/30/19 1713      PT LONG TERM GOAL #1   Title  Independent with advanced HEP.    Time  6    Period   Weeks    Status  On-going      PT LONG TERM GOAL #2   Title  Patient to demonstrate cervical AROM WFL and without pain limiting.    Time  6    Period  Weeks    Status  On-going      PT LONG TERM GOAL #3   Title  Patient to demonstrate B shoulder strength >/=4+/5.    Time  6    Period  Weeks    Status  On-going      PT LONG TERM GOAL #4   Title  Patient to demonstrate R full handed/3 point pinch grip strength symmetrical to opposite UE.    Time  6    Period  Weeks    Status  On-going      PT LONG TERM GOAL #5   Title  Patient to demonstrate R 4th digit AROM WFL in order to make a closed fist.    Time  6    Period  Weeks    Status  On-going      PT LONG TERM GOAL #6   Title  Patient to return to fitness regimen without pain.    Time  6    Period  Weeks    Status  On-going            Plan - 09/12/19 1630    Clinical Impression Statement  Pt. with initial complaint of mid upper back/upper shoulder pain and with increased tone in L-sided rhomboids, thoracic paraspinals, B UT, LS.  MT providing some relief to upper back musculature and duration of session focused on cervical/upper shoulder stretching and gentle scapular strengthening.  Ended session with pt. noting good relief from pain.     Comorbidities  post concussive syndrome, lumbago, mitral valve disorder, HLD, asthma, anxiety, anemia    Rehab Potential  Good    PT Frequency  1x / week    PT Treatment/Interventions  ADLs/Self Care Home Management;Cryotherapy;Electrical Stimulation;Moist Heat;Traction;Therapeutic exercise;Therapeutic activities;Functional mobility training;Ultrasound;Neuromuscular re-education;Patient/family education;Manual techniques;Taping;Energy conservation;Vasopneumatic Device;Dry needling;Passive range of motion    PT Next Visit Plan  STM; progress cervical and R index ROM    Consulted and Agree with Plan of  Care  Patient       Patient will benefit from skilled therapeutic intervention in  order to improve the following deficits and impairments:  Decreased activity tolerance, Decreased strength, Increased fascial restricitons, Impaired UE functional use, Pain, Increased muscle spasms, Decreased range of motion, Improper body mechanics, Postural dysfunction, Impaired flexibility  Visit Diagnosis: Cervicalgia  Finger joint stiff, right  Muscle weakness (generalized)  Other symptoms and signs involving the musculoskeletal system     Problem List Patient Active Problem List   Diagnosis Date Noted  . Educated about COVID-19 virus infection 06/21/2019  . Insomnia 02/19/2019  . Dysuria 01/13/2019  . Anxiety 01/13/2019  . Cough 07/25/2018  . Perimenopausal 01/02/2018  . History of Helicobacter pylori infection 12/28/2017  . Nocturia 07/19/2016  . Rash and nonspecific skin eruption 03/21/2016  . Syncope 01/08/2016  . Osteoarthritis 07/08/2015  . Hematuria 01/20/2015  . Hyperlipidemia, mild 01/20/2015  . Seasonal and perennial allergic rhinitis 01/20/2015  . Fatigue 10/24/2014  . Sinusitis 10/11/2014  . Preventative health care 06/03/2014  . Diverticulosis 07/01/2011  . Menopausal state 03/25/2011  . Fibroids 10/24/2010  . Anemia 03/25/2010  . IRRITABLE BOWEL SYNDROME 03/25/2010  . Mitral valve disorder 07/09/2007  . Allergic asthma, mild intermittent, uncomplicated 14/01/3012  . ESOPHAGEAL REFLUX 07/09/2007  . DERMATITIS 07/09/2007  . LOW BACK PAIN 12/14/2006    Bess Harvest, PTA 09/12/19 6:16 PM   [facility] [address]  [place], [place], [address] Phone: [phone]   Fax:  [phone]  Name: [name] MRN: [id number] Date of Birth: [birth year]

## 2019-09-12 NOTE — Patient Instructions (Addendum)
Go back to [facility] to get the second shingrix shot  Plain mucinex twice a day, and some Alavert daily  Bone density shows osteopenia, which is thinner than normal but not as bad as osteoporosis. Recommend calcium intake of 1200 to 1500 mg daily, divided into roughly 3 doses. Best source is the diet and a single dairy serving is about 500 mg, a supplement of calcium citrate once or twice daily to balance diet is fine if not getting enough in diet. Also need Vitamin D 2000 IU caps, 1 cap daily if not already taking vitamin D. Also recommend weight baring exercise on hips and upper body to keep bones strong   Preventive Care 69-76 Years Old, Female Preventive care refers to visits with your health care provider and lifestyle choices that can promote health and wellness. This includes:  A yearly physical exam. This may also be called an annual well check.  Regular dental visits and eye exams.  Immunizations.  Screening for certain conditions.  Healthy lifestyle choices, such as eating a healthy diet, getting regular exercise, not using drugs or products that contain nicotine and tobacco, and limiting alcohol use. What can I expect for my preventive care visit? Physical exam Your health care provider will check your:  Height and weight. This may be used to calculate body mass index (BMI), which tells if you are at a healthy weight.  Heart rate and blood pressure.  Skin for abnormal spots. Counseling Your health care provider may ask you questions about your:  Alcohol, tobacco, and drug use.  Emotional well-being.  Home and relationship well-being.  Sexual activity.  Eating habits.  Work and work Statistician.  Method of birth control.  Menstrual cycle.  Pregnancy history. What immunizations do I need?  Influenza (flu) vaccine  This is recommended every year. Tetanus, diphtheria, and pertussis (Tdap) vaccine  You may need a Td booster every 10 years. Varicella  (chickenpox) vaccine  You may need this if you have not been vaccinated. Zoster (shingles) vaccine  You may need this after age 87. Measles, mumps, and rubella (MMR) vaccine  You may need at least one dose of MMR if you were born in 1957 or later. You may also need a second dose. Pneumococcal conjugate (PCV13) vaccine  You may need this if you have certain conditions and were not previously vaccinated. Pneumococcal polysaccharide (PPSV23) vaccine  You may need one or two doses if you smoke cigarettes or if you have certain conditions. Meningococcal conjugate (MenACWY) vaccine  You may need this if you have certain conditions. Hepatitis A vaccine  You may need this if you have certain conditions or if you travel or work in places where you may be exposed to hepatitis A. Hepatitis B vaccine  You may need this if you have certain conditions or if you travel or work in places where you may be exposed to hepatitis B. Haemophilus influenzae type b (Hib) vaccine  You may need this if you have certain conditions. Human papillomavirus (HPV) vaccine  If recommended by your health care provider, you may need three doses over 6 months. You may receive vaccines as individual doses or as more than one vaccine together in one shot (combination vaccines). Talk with your health care provider about the risks and benefits of combination vaccines. What tests do I need? Blood tests  Lipid and cholesterol levels. These may be checked every 5 years, or more frequently if you are over 9 years old.  Hepatitis C test.  Hepatitis B test. Screening  Lung cancer screening. You may have this screening every year starting at age 61 if you have a 30-pack-year history of smoking and currently smoke or have quit within the past 15 years.  Colorectal cancer screening. All adults should have this screening starting at age 41 and continuing until age 46. Your health care provider may recommend screening at  age 44 if you are at increased risk. You will have tests every 1-10 years, depending on your results and the type of screening test.  Diabetes screening. This is done by checking your blood sugar (glucose) after you have not eaten for a while (fasting). You may have this done every 1-3 years.  Mammogram. This may be done every 1-2 years. Talk with your health care provider about when you should start having regular mammograms. This may depend on whether you have a family history of breast cancer.  BRCA-related cancer screening. This may be done if you have a family history of breast, ovarian, tubal, or peritoneal cancers.  Pelvic exam and Pap test. This may be done every 3 years starting at age 56. Starting at age 57, this may be done every 5 years if you have a Pap test in combination with an HPV test. Other tests  Sexually transmitted disease (STD) testing.  Bone density scan. This is done to screen for osteoporosis. You may have this scan if you are at high risk for osteoporosis. Follow these instructions at home: Eating and drinking  Eat a diet that includes fresh fruits and vegetables, whole grains, lean protein, and low-fat dairy.  Take vitamin and mineral supplements as recommended by your health care provider.  Do not drink alcohol if: ? Your health care provider tells you not to drink. ? You are pregnant, may be pregnant, or are planning to become pregnant.  If you drink alcohol: ? Limit how much you have to 0-1 drink a day. ? Be aware of how much alcohol is in your drink. In the [place] one drink equals one 12 oz bottle of beer (355 mL), one 5 oz glass of wine (148 mL), or one 1 oz glass of hard liquor (44 mL). Lifestyle  Take daily care of your teeth and gums.  Stay active. Exercise for at least 30 minutes on 5 or more days each week.  Do not use any products that contain nicotine or tobacco, such as cigarettes, e-cigarettes, and chewing tobacco. If you need help quitting,  ask your health care provider.  If you are sexually active, practice safe sex. Use a condom or other form of birth control (contraception) in order to prevent pregnancy and STIs (sexually transmitted infections).  If told by your health care provider, take low-dose aspirin daily starting at age 47. What's next?  Visit your health care provider once a year for a well check visit.  Ask your health care provider how often you should have your eyes and teeth checked.  Stay up to date on all vaccines. This information is not intended to replace advice given to you by your health care provider. Make sure you discuss any questions you have with your health care provider. Document Revised: 12/23/2017 Document Reviewed: 12/23/2017 Elsevier Patient Education  2020 [employer].

## 2019-09-12 NOTE — Assessment & Plan Note (Signed)
Avoid offending foods, start probiotics. Do not eat large meals in late evening and consider raising head of bed.  

## 2019-09-12 NOTE — Assessment & Plan Note (Addendum)
Two weeks of congestion but no fever and she does feel she is improving. Encouraged increased rest and hydration, add probiotics, zinc. Report worsening symptoms
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
## 2019-09-12 NOTE — Progress Notes (Signed)
urine

## 2019-09-13 ENCOUNTER — Telehealth: Payer: Self-pay | Admitting: Family Medicine

## 2019-09-13 LAB — URINE CULTURE
MICRO NUMBER:: 10490492
Result:: NO GROWTH
SPECIMEN QUALITY:: ADEQUATE

## 2019-09-13 NOTE — Telephone Encounter (Signed)
Caller: Milaina  Call back number: [phone]  Patient states FMLA returned date is incorrect it should be 11/20/19.   Please advise

## 2019-09-14 NOTE — Telephone Encounter (Signed)
Patient notified that updated form has been faxed.

## 2019-09-15 ENCOUNTER — Encounter: Payer: Self-pay | Admitting: Family Medicine

## 2019-09-15 ENCOUNTER — Ambulatory Visit (INDEPENDENT_AMBULATORY_CARE_PROVIDER_SITE_OTHER): Payer: 59 | Admitting: Psychology

## 2019-09-15 DIAGNOSIS — F4322 Adjustment disorder with anxiety: Secondary | ICD-10-CM | POA: Diagnosis not present

## 2019-09-15 NOTE — Telephone Encounter (Signed)
Patient states the short tem disability paper needed to be filled out so patient can get paid.  Per patient provider needs to fill out [place] paper work   Patient states that form is separate from [employer] .     [place]  Fax : [phone]
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
## 2019-09-17 NOTE — Assessment & Plan Note (Signed)
And abnormal urine color. Urinalysis and culture normal

## 2019-09-17 NOTE — Assessment & Plan Note (Signed)
Patient encouraged to maintain heart healthy diet, regular exercise, adequate sleep. Consider daily probiotics. Take medications as prescribed. Labs ordered and reviewed. Colonoscopy due in 2023, MGM due in December

## 2019-09-17 NOTE — Assessment & Plan Note (Signed)
Increase leafy greens, consider increased lean red meat and using cast iron cookware. Continue to monitor, report any concernsl

## 2019-09-18 ENCOUNTER — Telehealth: Payer: Self-pay

## 2019-09-18 ENCOUNTER — Other Ambulatory Visit: Payer: Self-pay | Admitting: Family Medicine

## 2019-09-18 ENCOUNTER — Telehealth: Payer: Self-pay | Admitting: Family Medicine

## 2019-09-18 DIAGNOSIS — M79644 Pain in right finger(s): Secondary | ICD-10-CM

## 2019-09-18 NOTE — Telephone Encounter (Signed)
I have placed the xray order please let her know

## 2019-09-18 NOTE — Telephone Encounter (Signed)
Patient called in to see if Dr. Charlett Blake could put in an order for an X Ray for her right index finger. Please follow up with the patient as soon as this is done at [phone]

## 2019-09-18 NOTE — Telephone Encounter (Signed)
Paper work is in [employer] to be completed

## 2019-09-18 NOTE — Telephone Encounter (Signed)
Caller : [name]  Call Back # [phone]  Patient is requesting to know whether or not we finished her [facility] for pay. Patient sates she needs paperwork filled out ASAP please.  Patient states that her job will resend [employer] Paper work b/c patient will return back to work half days  To keep tendered.  Patient states that she will return June 14,2021

## 2019-09-19 ENCOUNTER — Encounter: Payer: Self-pay | Admitting: Physical Therapy

## 2019-09-19 ENCOUNTER — Other Ambulatory Visit: Payer: Self-pay

## 2019-09-19 ENCOUNTER — Ambulatory Visit (HOSPITAL_BASED_OUTPATIENT_CLINIC_OR_DEPARTMENT_OTHER)
Admission: RE | Admit: 2019-09-19 | Discharge: 2019-09-19 | Disposition: A | Discharge status: Home / Self Care | Payer: 59 | Source: Ambulatory Visit | Attending: Family Medicine | Admitting: Family Medicine

## 2019-09-19 ENCOUNTER — Ambulatory Visit: Payer: 59 | Admitting: Physical Therapy

## 2019-09-19 DIAGNOSIS — M542 Cervicalgia: Secondary | ICD-10-CM

## 2019-09-19 DIAGNOSIS — M79644 Pain in right finger(s): Secondary | ICD-10-CM | POA: Insufficient documentation

## 2019-09-19 DIAGNOSIS — M25641 Stiffness of right hand, not elsewhere classified: Secondary | ICD-10-CM

## 2019-09-19 DIAGNOSIS — R29898 Other symptoms and signs involving the musculoskeletal system: Secondary | ICD-10-CM

## 2019-09-19 DIAGNOSIS — M6281 Muscle weakness (generalized): Secondary | ICD-10-CM

## 2019-09-19 NOTE — Telephone Encounter (Signed)
Patient notified & she stated she will be getting an Xray today & also mentioned she dropped off FMLA paperwork

## 2019-09-19 NOTE — Therapy (Signed)
[facility] [address]  [place], [place], [address] Phone: [phone]   Fax:  [phone]  Physical Therapy Treatment  Patient Details  Name: [name] MRN: [id number] Date of Birth: [birth year] Referring Provider (PT): Penni Homans, MD   Encounter Date: 09/19/2019  PT End of Session - 09/19/19 1659    Visit Number  5    Number of Visits  7    Date for PT Re-Evaluation  10/04/19    Authorization Type  UHC    PT Start Time  1093    PT Stop Time  1657    PT Time Calculation (min)  41 min    Activity Tolerance  Patient tolerated treatment well    Behavior During Therapy  [facility] for tasks assessed/performed;Anxious       Past Medical History:  Diagnosis Date  . Allergic rhinitis   . Anemia   . Anxiety   . Asthma   . Contact dermatitis and eczema   . Diverticulitis    CT Scan  . Esophageal reflux   . H. pylori infection   . Hyperlipidemia, mixed   . IBS (irritable bowel syndrome)   . IC (interstitial cystitis)   . Internal hemorrhoids   . Lumbago   . Mitral valve disorders(424.0)   . Osteoarthritis 07/08/2015  . Post concussive syndrome   . Seasonal and perennial allergic rhinitis 01/20/2015    Past Surgical History:  Procedure Laterality Date  . BARTHOLIN CYST MARSUPIALIZATION N/A 12/18/2013   Procedure: BARTHOLIN CYST MARSUPIALIZATION WITH BIOPSY;  Surgeon: Lovenia Kim, MD;  Location: [facility];  Service: Gynecology;  Laterality: N/A;  . CESAREAN SECTION    . DILITATION & CURRETTAGE/HYSTROSCOPY WITH NOVASURE ABLATION N/A 10/14/2012   Procedure: DILATATION & CURETTAGE/HYSTEROSCOPY WITH NOVASURE ABLATION;  Surgeon: Lovenia Kim, MD;  Location: [facility];  Service: Gynecology;  Laterality: N/A;  . MOUTH SURGERY    . SINUS SURGERY WITH INSTATRAK      There were no vitals filed for this visit.  Subjective Assessment - 09/19/19 1621    Subjective  Had an xray for her R 2nd digit d/t pain over  the DIP joint. Feels that she is doing better with her neck and will be ready to wrap up within the remaining visits.    Pertinent History  post concussive syndrome, lumbago, mitral valve disorder, HLD, asthma, anxiety, anemia    Diagnostic tests  08/11/19 cervical xray: Multilevel degenerative change in the cervical spine. No evidence of acute osseous abnormality. Straightening of normal lordosis, also seen on 2017 cervical spine CT.    Patient Stated Goals  "make sure my alignment is not off and work on bath strength"    Currently in Pain?  No/denies                        [facility] Adult PT Treatment/Exercise - 09/19/19 0001      Neck Exercises: Machines for Strengthening   UBE (Upper Arm Bike)  L2.0 x 85min forward/3 min back    Cybex Row  B low row 20# x10    Lat Pull  5# x10   attempted 15# but with difficulty d/t positioning     Neck Exercises: Standing   Neck Retraction  10 reps    Neck Retraction Limitations  into ball on wall 10x3"      Neck Exercises: Prone   Other Prone Exercise  B prone Y over  orange pbaall 10x 0#, 10x 1#; prone B T over orange pball x10 0#, x10 1#      Shoulder Exercises: Stretch   Corner Stretch  2 reps;30 seconds    Corner Stretch Limitations  cueing for proper positioning             PT Education - 09/19/19 1659    Education Details  update to HEP    Person(s) Educated  Patient    Methods  Explanation;Demonstration;Tactile cues;Verbal cues;Handout    Comprehension  Returned demonstration;Verbalized understanding       PT Short Term Goals - 09/05/19 1801      PT SHORT TERM GOAL #1   Title  Independent with initial HEP.    Time  3    Period  Weeks    Status  Achieved    Target Date  09/13/19        PT Long Term Goals - 08/30/19 1713      PT LONG TERM GOAL #1   Title  Independent with advanced HEP.    Time  6    Period  Weeks    Status  On-going      PT LONG TERM GOAL #2   Title  Patient to demonstrate cervical  AROM WFL and without pain limiting.    Time  6    Period  Weeks    Status  On-going      PT LONG TERM GOAL #3   Title  Patient to demonstrate B shoulder strength >/=4+/5.    Time  6    Period  Weeks    Status  On-going      PT LONG TERM GOAL #4   Title  Patient to demonstrate R full handed/3 point pinch grip strength symmetrical to opposite UE.    Time  6    Period  Weeks    Status  On-going      PT LONG TERM GOAL #5   Title  Patient to demonstrate R 4th digit AROM WFL in order to make a closed fist.    Time  6    Period  Weeks    Status  On-going      PT LONG TERM GOAL #6   Title  Patient to return to fitness regimen without pain.    Time  6    Period  Weeks    Status  On-going            Plan - 09/19/19 1700    Clinical Impression Statement  Patient noting that she had an x-ray over her R 2nd digit d/t pain over the DIP joint. Feels that she is doing better with her neck and will be ready to wrap up within the remaining visits. Worked on progressive periscapular strengthening with patient demonstrating good form and no c/o pain. Able to increase challenge with light weighted resistance with good maintenance of form. Patient with good awareness of positioning and form with ther-ex, allowing her to carryover cues quite well. Continued with machine strengthening for back musculature with patient struggling slightly with lat pulldown d/t positioning, with better ability to tolerate this exercise at light weight. Updated HEP with machine strengthening for continue practice at patient's gym. Patient reported understanding and without complaints at end of session.    Comorbidities  post concussive syndrome, lumbago, mitral valve disorder, HLD, asthma, anxiety, anemia    Rehab Potential  Good    PT Frequency  1x / week    PT  Treatment/Interventions  ADLs/Self Care Home Management;Cryotherapy;Electrical Stimulation;Moist Heat;Traction;Therapeutic exercise;Therapeutic  activities;Functional mobility training;Ultrasound;Neuromuscular re-education;Patient/family education;Manual techniques;Taping;Energy conservation;Vasopneumatic Device;Dry needling;Passive range of motion    PT Next Visit Plan  periscapular strengthening; STM; progress cervical and R index ROM    Consulted and Agree with Plan of Care  Patient       Patient will benefit from skilled therapeutic intervention in order to improve the following deficits and impairments:  Decreased activity tolerance, Decreased strength, Increased fascial restricitons, Impaired UE functional use, Pain, Increased muscle spasms, Decreased range of motion, Improper body mechanics, Postural dysfunction, Impaired flexibility  Visit Diagnosis: Cervicalgia  Finger joint stiff, right  Muscle weakness (generalized)  Other symptoms and signs involving the musculoskeletal system     Problem List Patient Active Problem List   Diagnosis Date Noted  . Educated about COVID-19 virus infection 06/21/2019  . Insomnia 02/19/2019  . Dysuria 01/13/2019  . Anxiety 01/13/2019  . Cough 07/25/2018  . Perimenopausal 01/02/2018  . History of Helicobacter pylori infection 12/28/2017  . Nocturia 07/19/2016  . Rash and nonspecific skin eruption 03/21/2016  . Syncope 01/08/2016  . Osteoarthritis 07/08/2015  . Hematuria 01/20/2015  . Hyperlipidemia, mild 01/20/2015  . Seasonal and perennial allergic rhinitis 01/20/2015  . Fatigue 10/24/2014  . Sinusitis 10/11/2014  . Preventative health care 06/03/2014  . Diverticulosis 07/01/2011  . Menopausal state 03/25/2011  . Fibroids 10/24/2010  . Anemia 03/25/2010  . IRRITABLE BOWEL SYNDROME 03/25/2010  . Mitral valve disorder 07/09/2007  . Allergic asthma, mild intermittent, uncomplicated 64/38/3779  . ESOPHAGEAL REFLUX 07/09/2007  . DERMATITIS 07/09/2007  . LOW BACK PAIN 12/14/2006     [name], PT, DPT 09/19/19 5:01 PM   [facility] [address]  [place], [place], [address] Phone: [phone]   Fax:  [phone]  Name: [name] MRN: [id number] Date of Birth: 10-May-1965

## 2019-09-19 NOTE — Telephone Encounter (Signed)
Patient states FMLA return to work restriction paper work sent to PCP. Patient states that she starts work at 7:30 am and will finish at 12:30pm.

## 2019-09-20 NOTE — Telephone Encounter (Signed)
See phone notes.

## 2019-09-20 NOTE — Telephone Encounter (Signed)
Form placed in yellow folder for provider to fill out.  Patient needs the [facility] form filled out to get paid.  She stated that was suppose to have been given to her in the beginning but was not.

## 2019-09-20 NOTE — Telephone Encounter (Signed)
Form placed in yellow folder for provider to fill out.  Spoke with patient.  She stated that she spoke with her HR and they told her that she would loose her tenure and would not be able to get the disability retirement unless she comes back and work part-time for 2 months.  She would like for [place] to fill out form for her to return 10/09/19-12/26/19 working Monday thru Friday from 730am to 1230am

## 2019-09-21 NOTE — Telephone Encounter (Signed)
Patient notified that she needed appointment.  Appointment made for tomorrow, okayed by Dr. Charlett Blake.

## 2019-09-22 ENCOUNTER — Telehealth (INDEPENDENT_AMBULATORY_CARE_PROVIDER_SITE_OTHER): Payer: 59 | Admitting: Family Medicine

## 2019-09-22 ENCOUNTER — Encounter: Payer: Self-pay | Admitting: Family Medicine

## 2019-09-22 ENCOUNTER — Other Ambulatory Visit: Payer: Self-pay

## 2019-09-22 ENCOUNTER — Ambulatory Visit (INDEPENDENT_AMBULATORY_CARE_PROVIDER_SITE_OTHER): Payer: 59 | Admitting: Psychology

## 2019-09-22 DIAGNOSIS — G4700 Insomnia, unspecified: Secondary | ICD-10-CM

## 2019-09-22 DIAGNOSIS — F4322 Adjustment disorder with anxiety: Secondary | ICD-10-CM | POA: Diagnosis not present

## 2019-09-22 DIAGNOSIS — F419 Anxiety disorder, unspecified: Secondary | ICD-10-CM | POA: Diagnosis not present

## 2019-09-22 DIAGNOSIS — E785 Hyperlipidemia, unspecified: Secondary | ICD-10-CM

## 2019-09-22 DIAGNOSIS — F321 Major depressive disorder, single episode, moderate: Secondary | ICD-10-CM | POA: Diagnosis not present

## 2019-09-22 DIAGNOSIS — M542 Cervicalgia: Secondary | ICD-10-CM

## 2019-09-22 NOTE — Assessment & Plan Note (Addendum)
Patient is feeling better with a few days off and is ready to go back to work on a part time visit. She is getting weekly counseling and is going to start Sertraline after she gets an immunization this week. Forms for her disability and work limitations are filed out today.

## 2019-09-25 DIAGNOSIS — M542 Cervicalgia: Secondary | ICD-10-CM | POA: Insufficient documentation

## 2019-09-25 NOTE — Assessment & Plan Note (Signed)
Encouraged good sleep hygiene such as dark, quiet room. No blue/green glowing lights such as computer screens in bedroom. No alcohol or stimulants in evening. Cut down on caffeine as able. Regular exercise is helpful but not just prior to bed time.  

## 2019-09-25 NOTE — Assessment & Plan Note (Signed)
Encouraged moist heat and gentle stretching as tolerated. May try NSAIDs and prescription meds as directed and report if symptoms worsen or seek immediate care. This is worse with stress and this has improved while away from work and receiving weekly PT

## 2019-09-25 NOTE — Assessment & Plan Note (Signed)
Encouraged heart healthy diet, increase exercise, avoid trans fats, consider a krill oil cap daily 

## 2019-09-25 NOTE — Progress Notes (Signed)
.Virtual Visit via Video Note  I connected with [name] on 09/22/19 at  9:00 AM EDT by a video enabled telemedicine application and verified that I am speaking with the correct person using two identifiers.  Location: Patient: home Provider: home, patient and provider were in visit   I discussed the limitations of evaluation and management by telemedicine and the availability of in person appointments. The patient expressed understanding and agreed to proceed. [name], CMA was able to get the patient set up on a video visit   Subjective:    Patient ID: [name], female    DOB: [birth year], 54 y.o.   MRN: [id number]  Chief Complaint  Patient presents with  . Form Completion    HPI Patient is in today for follow up on her anxiety, depression and more. Her time away from work and therapy have been helpful. Her anxiety is still heightened but improved some with time away from work and with weekly counseling sessions. She has also been struggling with neck pain and is going to physical therapy weekly for this and this has been helpful. She feels she is able to go back to work part time. She struggles with insomnia but is getting more rest while off work. Denies CP/palp/SOB/HA/congestion/fevers/GI or GU c/o. Taking meds as prescribed  Past Medical History:  Diagnosis Date  . Allergic rhinitis   . Anemia   . Anxiety   . Asthma   . Contact dermatitis and eczema   . Diverticulitis    CT Scan  . Esophageal reflux   . H. pylori infection   . Hyperlipidemia, mixed   . IBS (irritable bowel syndrome)   . IC (interstitial cystitis)   . Internal hemorrhoids   . Lumbago   . Mitral valve disorders(424.0)   . Osteoarthritis 07/08/2015  . Post concussive syndrome   . Seasonal and perennial allergic rhinitis 01/20/2015    Past Surgical History:  Procedure Laterality Date  . BARTHOLIN CYST MARSUPIALIZATION N/A 12/18/2013   Procedure: BARTHOLIN CYST MARSUPIALIZATION  WITH BIOPSY;  Surgeon: Lovenia Kim, MD;  Location: [facility];  Service: Gynecology;  Laterality: N/A;  . CESAREAN SECTION    . DILITATION & CURRETTAGE/HYSTROSCOPY WITH NOVASURE ABLATION N/A 10/14/2012   Procedure: DILATATION & CURETTAGE/HYSTEROSCOPY WITH NOVASURE ABLATION;  Surgeon: Lovenia Kim, MD;  Location: [facility];  Service: Gynecology;  Laterality: N/A;  . MOUTH SURGERY    . SINUS SURGERY WITH INSTATRAK      Family History  Problem Relation Age of Onset  . Colon polyps Sister   . Mental illness Sister        anxiety from 9/11 survivors  . Varicose Veins Sister   . Osteoporosis Sister   . Diabetes Mother   . Hypertension Mother   . Heart attack Mother        MI at age 70  . Alcohol abuse Mother        smoker  . Heart disease Mother   . Dementia Mother   . Hypertension Father   . Hyperlipidemia Father   . Heart attack Father   . Cancer Father        lung cancer, smoker  . Cancer Brother        acute leukemia  . Dementia Maternal Grandmother   . Heart disease Maternal Grandfather        mi  . Cancer Paternal Grandmother        GYN cancer  . Other Sister  hypoglycemia  . Varicose Veins Sister   . Rheum arthritis Sister   . Pleurisy Sister   . Fibromyalgia Sister   . Mental retardation Sister        depression  . GER disease Son   . Alcohol abuse Other        Family history  . Colon cancer Neg Hx   . Breast cancer Neg Hx     Social History   Socioeconomic History  . Marital status: Married    Spouse name: Not on file  . Number of children: 2  . Years of education: Masters  . Highest education level: Not on file  Occupational History  . Occupation: [employer]  Tobacco Use  . Smoking status: Never Smoker  . Smokeless tobacco: Never Used  Substance and Sexual Activity  . Alcohol use: Not Currently  . Drug use: No  . Sexual activity: Yes    Comment: lives with husband and son, work with [employer] as a Clinical research associate, aoivd gluten,  dairy  Other Topics Concern  . Not on file  Social History Narrative   Lives at home home with husband and son.   Right-handed.   1 cup caffeine daily.   Social Determinants of Health   Financial Resource Strain:   . Difficulty of Paying Living Expenses:   Food Insecurity:   . Worried About Charity fundraiser in the Last Year:   . Arboriculturist in the Last Year:   Transportation Needs:   . Film/video editor (Medical):   Marland Kitchen Lack of Transportation (Non-Medical):   Physical Activity:   . Days of Exercise per Week:   . Minutes of Exercise per Session:   Stress:   . Feeling of Stress :   Social Connections:   . Frequency of Communication with Friends and Family:   . Frequency of Social Gatherings with Friends and Family:   . Attends Religious Services:   . Active Member of Clubs or Organizations:   . Attends Archivist Meetings:   Marland Kitchen Marital Status:   Intimate Partner Violence:   . Fear of Current or Ex-Partner:   . Emotionally Abused:   Marland Kitchen Physically Abused:   . Sexually Abused:     Outpatient Medications Prior to Visit  Medication Sig Dispense Refill  . hydrOXYzine (ATARAX/VISTARIL) 10 MG tablet Take 1-2 tablets (10-20 mg total) by mouth every 8 (eight) hours as needed for anxiety. 60 tablet 2  . Multiple Vitamins-Minerals (MULTIVITAMIN ADULT PO) Take 1 tablet by mouth daily.    . Omega-3 Fatty Acids (FISH OIL) 1000 MG CAPS Take by mouth.    Marland Kitchen OVER THE COUNTER MEDICATION Calcium    . Probiotic Product (PROBIOTIC DAILY PO) Take 1 tablet by mouth daily.    . sertraline (ZOLOFT) 25 MG tablet Take 0.5-1 tablets (12.5-25 mg total) by mouth daily. 30 tablet 3  . sodium chloride (OCEAN) 0.65 % SOLN nasal spray Place 1 spray into both nostrils as needed for congestion.     No facility-administered medications prior to visit.    Allergies  Allergen Reactions  . Amoxicillin Shortness Of Breath    Doesn't remember rxn  . Aspirin Shortness Of Breath     "irritated stomach" Ibuprofen and Naproxen do not agree with her either  . Codeine Hives and Shortness Of Breath    Does not take percocet or hydrocodone; tylenol only  . Latex Shortness Of Breath and Itching  . Other Shortness Of  Breath    peanuts  . Sulfa Antibiotics Hives and Shortness Of Breath  . Sulfamethoxazole-Trimethoprim Shortness Of Breath  . Sulfonamide Derivatives Shortness Of Breath and Itching  . Sulphadimidine Sodium [Sulfamethazine Sodium] Shortness Of Breath    Added to food for preservative  . Ciprofloxacin Other (See Comments)    REACTION: SOB, "Tightness in head" REACTION: SOB, "Tightness in head" Syncope   . Azithromycin Other (See Comments)    Faintness  . Carisoprodol   . Clarithromycin     "Doesn't agree with me"  . Epinephrine     Heart racing  . Fenofibrate Other (See Comments)    Leg cramps, body aches   . Metronidazole Other (See Comments)    "cannot tolerate"  . Nsaids   . Penicillins     Doesn't remember reaction  . Tolmetin   . Omeprazole Palpitations    Review of Systems  Constitutional: Positive for malaise/fatigue. Negative for fever.  HENT: Negative for congestion.   Eyes: Negative for blurred vision.  Respiratory: Negative for shortness of breath.   Cardiovascular: Negative for chest pain, palpitations and leg swelling.  Gastrointestinal: Negative for abdominal pain, blood in stool and nausea.  Genitourinary: Negative for dysuria and frequency.  Musculoskeletal: Positive for myalgias and neck pain. Negative for falls.  Skin: Negative for rash.  Neurological: Negative for dizziness, loss of consciousness and headaches.  Endo/Heme/Allergies: Negative for environmental allergies.  Psychiatric/Behavioral: Positive for depression. The patient is nervous/anxious and has insomnia.        Objective:    Physical Exam Constitutional:      Appearance: Normal appearance. She is not ill-appearing.  HENT:     Head: Normocephalic and  atraumatic.     Right Ear: External ear normal.     Left Ear: External ear normal.     Nose: Nose normal.  Eyes:     General:        Right eye: No discharge.        Left eye: No discharge.  Pulmonary:     Effort: Pulmonary effort is normal.  Neurological:     Mental Status: She is alert and oriented to person, place, and time.  Psychiatric:        Behavior: Behavior normal.     BP (!) 108/56   Pulse 78   SpO2 95%  Wt Readings from Last 3 Encounters:  09/12/19 103 lb 3.2 oz (46.8 kg)  08/11/19 106 lb 12.8 oz (48.4 kg)  07/10/19 107 lb (48.5 kg)    Diabetic Foot Exam - Simple   No data filed     Lab Results  Component Value Date   WBC 5.4 08/14/2019   HGB 13.8 08/14/2019   HCT 40.2 08/14/2019   PLT 295.0 08/14/2019   GLUCOSE 72 08/14/2019   CHOL 199 08/14/2019   TRIG 82.0 08/14/2019   HDL 58.10 08/14/2019   LDLDIRECT 143.0 01/17/2019   LDLCALC 125 (H) 08/14/2019   ALT 13 08/14/2019   AST 15 08/14/2019   NA 140 08/14/2019   K 4.0 08/14/2019   CL 103 08/14/2019   CREATININE 0.62 08/14/2019   BUN 17 08/14/2019   CO2 29 08/14/2019   TSH 2.27 08/14/2019   HGBA1C 5.0 08/14/2019    Lab Results  Component Value Date   TSH 2.27 08/14/2019   Lab Results  Component Value Date   WBC 5.4 08/14/2019   HGB 13.8 08/14/2019   HCT 40.2 08/14/2019   MCV 98.4 08/14/2019   PLT 295.0  08/14/2019   Lab Results  Component Value Date   NA 140 08/14/2019   K 4.0 08/14/2019   CO2 29 08/14/2019   GLUCOSE 72 08/14/2019   BUN 17 08/14/2019   CREATININE 0.62 08/14/2019   BILITOT 1.0 08/14/2019   ALKPHOS 86 08/14/2019   AST 15 08/14/2019   ALT 13 08/14/2019   PROT 7.0 08/14/2019   ALBUMIN 4.5 08/14/2019   CALCIUM 9.6 08/14/2019   ANIONGAP 10 01/09/2019   GFR 100.19 08/14/2019   Lab Results  Component Value Date   CHOL 199 08/14/2019   Lab Results  Component Value Date   HDL 58.10 08/14/2019   Lab Results  Component Value Date   LDLCALC 125 (H) 08/14/2019    Lab Results  Component Value Date   TRIG 82.0 08/14/2019   Lab Results  Component Value Date   CHOLHDL 3 08/14/2019   Lab Results  Component Value Date   HGBA1C 5.0 08/14/2019       Assessment & Plan:   Problem List Items Addressed This Visit    Hyperlipidemia, mild    Encouraged heart healthy diet, increase exercise, avoid trans fats, consider a krill oil cap daily      Anxiety    Patient is feeling better with a few days off and is ready to go back to work on a part time visit. She is getting weekly counseling and is going to start Sertraline after she gets an immunization this week. Forms for her disability and work limitations are filed out today.       Insomnia    Encouraged good sleep hygiene such as dark, quiet room. No blue/green glowing lights such as computer screens in bedroom. No alcohol or stimulants in evening. Cut down on caffeine as able. Regular exercise is helpful but not just prior to bed time.       Depression, major, single episode, moderate ([place])    She acknowledges worsening anhedonia. She will start Sertraline and continue counseling      Neck pain    Encouraged moist heat and gentle stretching as tolerated. May try NSAIDs and prescription meds as directed and report if symptoms worsen or seek immediate care. This is worse with stress and this has improved while away from work and receiving weekly PT         I am having [name] maintain her Probiotic Product (PROBIOTIC DAILY PO), OVER THE COUNTER MEDICATION, Multiple Vitamins-Minerals (MULTIVITAMIN ADULT PO), Fish Oil, sodium chloride, sertraline, and hydrOXYzine.  No orders of the defined types were placed in this encounter.     I discussed the assessment and treatment plan with the patient. The patient was provided an opportunity to ask questions and all were answered. The patient agreed with the plan and demonstrated an understanding of the instructions.   The patient was advised  to call back or seek an in-person evaluation if the symptoms worsen or if the condition fails to improve as anticipated.  I provided 20 minutes of non-face-to-face time during this encounter.   Penni Homans, MD

## 2019-09-25 NOTE — Assessment & Plan Note (Signed)
She acknowledges worsening anhedonia. She will start Sertraline and continue counseling

## 2019-09-26 ENCOUNTER — Telehealth: Payer: Self-pay | Admitting: *Deleted

## 2019-09-26 ENCOUNTER — Other Ambulatory Visit: Payer: Self-pay

## 2019-09-26 ENCOUNTER — Ambulatory Visit: Payer: 59 | Attending: Family Medicine

## 2019-09-26 DIAGNOSIS — M542 Cervicalgia: Secondary | ICD-10-CM | POA: Insufficient documentation

## 2019-09-26 DIAGNOSIS — M6281 Muscle weakness (generalized): Secondary | ICD-10-CM

## 2019-09-26 DIAGNOSIS — R29898 Other symptoms and signs involving the musculoskeletal system: Secondary | ICD-10-CM

## 2019-09-26 DIAGNOSIS — M25641 Stiffness of right hand, not elsewhere classified: Secondary | ICD-10-CM | POA: Diagnosis present

## 2019-09-26 NOTE — Telephone Encounter (Signed)
Patient notified that [place] and fmla paperwork has been faxed and copy placed up front for her to pickup.

## 2019-09-26 NOTE — Therapy (Addendum)
[facility] [address]  [place], [place], [address] Phone: [phone]   Fax:  [phone]  Physical Therapy Treatment  Patient Details  Name: [name] MRN: [id number] Date of Birth: 11-11-1965 Referring Provider (PT): Penni Homans, MD   Encounter Date: 09/26/2019  PT End of Session - 09/26/19 1628    Visit Number  6    Number of Visits  7    Date for PT Re-Evaluation  10/04/19    Authorization Type  [employer]    PT Start Time  2542    PT Stop Time  7062    PT Time Calculation (min)  54 min    Activity Tolerance  Patient tolerated treatment well    Behavior During Therapy  [facility] for tasks assessed/performed;Anxious       Past Medical History:  Diagnosis Date  . Allergic rhinitis   . Anemia   . Anxiety   . Asthma   . Contact dermatitis and eczema   . Diverticulitis    CT Scan  . Esophageal reflux   . H. pylori infection   . Hyperlipidemia, mixed   . IBS (irritable bowel syndrome)   . IC (interstitial cystitis)   . Internal hemorrhoids   . Lumbago   . Mitral valve disorders(424.0)   . Osteoarthritis 07/08/2015  . Post concussive syndrome   . Seasonal and perennial allergic rhinitis 01/20/2015    Past Surgical History:  Procedure Laterality Date  . BARTHOLIN CYST MARSUPIALIZATION N/A 12/18/2013   Procedure: BARTHOLIN CYST MARSUPIALIZATION WITH BIOPSY;  Surgeon: Lovenia Kim, MD;  Location: [facility];  Service: Gynecology;  Laterality: N/A;  . CESAREAN SECTION    . DILITATION & CURRETTAGE/HYSTROSCOPY WITH NOVASURE ABLATION N/A 10/14/2012   Procedure: DILATATION & CURETTAGE/HYSTEROSCOPY WITH NOVASURE ABLATION;  Surgeon: Lovenia Kim, MD;  Location: [facility];  Service: Gynecology;  Laterality: N/A;  . MOUTH SURGERY    . SINUS SURGERY WITH INSTATRAK      There were no vitals filed for this visit.  Subjective Assessment - 09/26/19 1625    Subjective  Pt. reports 50% improvement in her neck pain  since starting therapy.    Pertinent History  post concussive syndrome, lumbago, mitral valve disorder, HLD, asthma, anxiety, anemia    Diagnostic tests  08/11/19 cervical xray: Multilevel degenerative change in the cervical spine. No evidence of acute osseous abnormality. Straightening of normal lordosis, also seen on 2017 cervical spine CT.    Patient Stated Goals  "make sure my alignment is not off and work on bath strength"    Currently in Pain?  No/denies    Pain Score  0-No pain    Multiple Pain Sites  No                        [facility] Adult PT Treatment/Exercise - 09/26/19 0001      Neck Exercises: Machines for Strengthening   UBE (Upper Arm Bike)  L2.0 x 28min forward/3 min back      Neck Exercises: Theraband   Rows  15 reps    Rows Limitations  red TB       Shoulder Exercises: ROM/Strengthening   Cybex Row  10 reps    Cybex Row Limitations  10# - low row       Modalities   Modalities  Moist Heat      Moist Heat Therapy   Number Minutes Moist Heat  10 Minutes  Moist Heat Location  Cervical      Manual Therapy   Manual Therapy  Soft tissue mobilization    Manual therapy comments  supine     Soft tissue mobilization  STM to cervical paraspinals, suboccipitals       Neck Exercises: Stretches   Upper Trapezius Stretch  Right;Left;1 rep;30 seconds    Upper Trapezius Stretch Limitations  hands anchored on table     Levator Stretch  Right;Left;1 rep;30 seconds    Levator Stretch Limitations  hands anchored on table     Corner Stretch  2 reps;30 seconds   mid in Secretary/administrator Limitations  mid and low              PT Education - 09/26/19 1734    Education Details  HEP update;  LS stretch    Person(s) Educated  Patient    Methods  Explanation;Demonstration;Verbal cues;Handout    Comprehension  Verbalized understanding;Returned demonstration;Verbal cues required       PT Short Term Goals - 09/05/19 1801      PT SHORT TERM GOAL #1    Title  Independent with initial HEP.    Time  3    Period  Weeks    Status  Achieved    Target Date  09/13/19        PT Long Term Goals - 08/30/19 1713      PT LONG TERM GOAL #1   Title  Independent with advanced HEP.    Time  6    Period  Weeks    Status  On-going      PT LONG TERM GOAL #2   Title  Patient to demonstrate cervical AROM WFL and without pain limiting.    Time  6    Period  Weeks    Status  On-going      PT LONG TERM GOAL #3   Title  Patient to demonstrate B shoulder strength >/=4+/5.    Time  6    Period  Weeks    Status  On-going      PT LONG TERM GOAL #4   Title  Patient to demonstrate R full handed/3 point pinch grip strength symmetrical to opposite UE.    Time  6    Period  Weeks    Status  On-going      PT LONG TERM GOAL #5   Title  Patient to demonstrate R 4th digit AROM WFL in order to make a closed fist.    Time  6    Period  Weeks    Status  On-going      PT LONG TERM GOAL #6   Title  Patient to return to fitness regimen without pain.    Time  6    Period  Weeks    Status  On-going            Plan - 09/26/19 1629    Clinical Impression Statement  Pt. noting 50% improvement in overall neck pain since starting therapy.  Notes a sense of neck "stiffness" with HEP activities thus addressed technique alteration with LS stretch and instructed in proper hold times with cervical stretching to address "stiffness".  Did express that she wishes to continue with therapy for a few visits after end of current POC to continue improving neck pain and postural training.  Tolerated all anterior chest stretching, cervical stretching, and scapular stabilization activities well today. Ended visit with moist heat to  cervical spine to reduce tension/tone.  Left session pain free.    Comorbidities  post concussive syndrome, lumbago, mitral valve disorder, HLD, asthma, anxiety, anemia    Rehab Potential  Good    PT Frequency  1x / week    PT  Treatment/Interventions  ADLs/Self Care Home Management;Cryotherapy;Electrical Stimulation;Moist Heat;Traction;Therapeutic exercise;Therapeutic activities;Functional mobility training;Ultrasound;Neuromuscular re-education;Patient/family education;Manual techniques;Taping;Energy conservation;Vasopneumatic Device;Dry needling;Passive range of motion    PT Next Visit Plan  periscapular strengthening; STM; progress cervical and R index ROM    Consulted and Agree with Plan of Care  Patient       Patient will benefit from skilled therapeutic intervention in order to improve the following deficits and impairments:  Decreased activity tolerance, Decreased strength, Increased fascial restricitons, Impaired UE functional use, Pain, Increased muscle spasms, Decreased range of motion, Improper body mechanics, Postural dysfunction, Impaired flexibility  Visit Diagnosis: Cervicalgia  Finger joint stiff, right  Muscle weakness (generalized)  Other symptoms and signs involving the musculoskeletal system     Problem List Patient Active Problem List   Diagnosis Date Noted  . Neck pain 09/25/2019  . Depression, major, single episode, moderate ([place]) 09/22/2019  . Educated about COVID-19 virus infection 06/21/2019  . Insomnia 02/19/2019  . Dysuria 01/13/2019  . Anxiety 01/13/2019  . Cough 07/25/2018  . Perimenopausal 01/02/2018  . History of Helicobacter pylori infection 12/28/2017  . Nocturia 07/19/2016  . Rash and nonspecific skin eruption 03/21/2016  . Syncope 01/08/2016  . Osteoarthritis 07/08/2015  . Hematuria 01/20/2015  . Hyperlipidemia, mild 01/20/2015  . Seasonal and perennial allergic rhinitis 01/20/2015  . Fatigue 10/24/2014  . Sinusitis 10/11/2014  . Preventative health care 06/03/2014  . Diverticulosis 07/01/2011  . Menopausal state 03/25/2011  . Fibroids 10/24/2010  . Anemia 03/25/2010  . IRRITABLE BOWEL SYNDROME 03/25/2010  . Mitral valve disorder 07/09/2007  . Allergic  asthma, mild intermittent, uncomplicated 56/31/4970  . ESOPHAGEAL REFLUX 07/09/2007  . DERMATITIS 07/09/2007  . LOW BACK PAIN 12/14/2006    Bess Harvest, PTA 09/26/19 5:35 PM   [facility] [address]  [place], [place], [address] Phone: [phone]   Fax:  [phone]  Name: [name] MRN: [id number] Date of Birth: July 02, 1965

## 2019-09-29 ENCOUNTER — Telehealth: Payer: 59 | Admitting: Family Medicine

## 2019-10-02 ENCOUNTER — Telehealth: Payer: Self-pay | Admitting: Family Medicine

## 2019-10-02 NOTE — Telephone Encounter (Signed)
Caller : [name]  Call Back # [phone]  Patient states her employer will send paperwork for Intermitted FMLA from April 25-July 25. The employer will also send Physical Fitness paper work to be filled out by Provider as well. Patient will return to work June 2.   Patient also states she has shingle shot, experiencing  a rash/swelling in her arm  below in injection site. Patient states that the rash is fading .

## 2019-10-04 ENCOUNTER — Telehealth: Payer: Self-pay | Admitting: Family Medicine

## 2019-10-04 NOTE — Telephone Encounter (Signed)
Patient spoke with GI doctor and they went over results and details of what to do next.

## 2019-10-04 NOTE — Telephone Encounter (Signed)
Caller: Cobi Call back phone number [phone]  Patient states her GI doctor told her H Pyloric was positive. Patient states she would like a test that you drink some type of liquid and they take x-ray afterwards. She would like test to be schedule before Friday since she goes back to work on Monday.

## 2019-10-05 NOTE — Telephone Encounter (Signed)
Paper work done

## 2019-10-06 ENCOUNTER — Ambulatory Visit (INDEPENDENT_AMBULATORY_CARE_PROVIDER_SITE_OTHER): Payer: 59 | Admitting: Psychology

## 2019-10-06 DIAGNOSIS — F4322 Adjustment disorder with anxiety: Secondary | ICD-10-CM | POA: Diagnosis not present

## 2019-10-06 NOTE — Telephone Encounter (Signed)
Paperwork has been faxed and confirmation fax sheet is on sheketia's desk.

## 2019-10-10 ENCOUNTER — Ambulatory Visit: Payer: 59 | Admitting: Physical Therapy

## 2019-10-10 NOTE — Telephone Encounter (Signed)
Notified that paperwork has been sent.  Patient stated that she would like for [place] to resend and state on paperwork that we authorize her to work half days.  Advised that her job has to make that determination and if they have those hours available.  She will call FMLA back to check status on paperwork and let us know if anything needs to be done.

## 2019-10-12 ENCOUNTER — Ambulatory Visit: Payer: 59 | Admitting: Physical Therapy

## 2019-10-12 ENCOUNTER — Telehealth: Payer: Self-pay | Admitting: Family Medicine

## 2019-10-12 ENCOUNTER — Encounter: Payer: Self-pay | Admitting: Physical Therapy

## 2019-10-12 ENCOUNTER — Other Ambulatory Visit: Payer: Self-pay

## 2019-10-12 DIAGNOSIS — M542 Cervicalgia: Secondary | ICD-10-CM | POA: Diagnosis not present

## 2019-10-12 DIAGNOSIS — M6281 Muscle weakness (generalized): Secondary | ICD-10-CM

## 2019-10-12 DIAGNOSIS — M25641 Stiffness of right hand, not elsewhere classified: Secondary | ICD-10-CM

## 2019-10-12 DIAGNOSIS — R29898 Other symptoms and signs involving the musculoskeletal system: Secondary | ICD-10-CM

## 2019-10-12 NOTE — Telephone Encounter (Signed)
Caller : [name] Call Back # [phone]   Patient states she is returning to work tomorrow. Patient needs a note stating that she can return to work for 8 hours daily . [employer] will not accept her FMLA Paper work for a reduced schedule. Patient is requesting this note to be done today.   Please Advise

## 2019-10-12 NOTE — Telephone Encounter (Signed)
Have not received anything yet from her FMLA folks, but are you ok with letter?

## 2019-10-12 NOTE — Therapy (Addendum)
[facility] [address]  [place], [place], [address] Phone: [phone]   Fax:  [phone]  Physical Therapy Treatment  Patient Details  Name: Marcellina Jonsson MRN: [id number] Date of Birth: [birth year] Referring Provider (PT): Penni Homans, MD   Encounter Date: 10/12/2019   PT End of Session - 10/12/19 1614    Visit Number 7    Number of Visits 7    Date for PT Re-Evaluation 10/04/19    Authorization Type [employer]    PT Start Time 3474    PT Stop Time 1611    PT Time Calculation (min) 38 min    Activity Tolerance Patient tolerated treatment well    Behavior During Therapy [facility] for tasks assessed/performed           Past Medical History:  Diagnosis Date  . Allergic rhinitis   . Anemia   . Anxiety   . Asthma   . Contact dermatitis and eczema   . Diverticulitis    CT Scan  . Esophageal reflux   . H. pylori infection   . Hyperlipidemia, mixed   . IBS (irritable bowel syndrome)   . IC (interstitial cystitis)   . Internal hemorrhoids   . Lumbago   . Mitral valve disorders(424.0)   . Osteoarthritis 07/08/2015  . Post concussive syndrome   . Seasonal and perennial allergic rhinitis 01/20/2015    Past Surgical History:  Procedure Laterality Date  . BARTHOLIN CYST MARSUPIALIZATION N/A 12/18/2013   Procedure: BARTHOLIN CYST MARSUPIALIZATION WITH BIOPSY;  Surgeon: Lovenia Kim, MD;  Location: [facility];  Service: Gynecology;  Laterality: N/A;  . CESAREAN SECTION    . DILITATION & CURRETTAGE/HYSTROSCOPY WITH NOVASURE ABLATION N/A 10/14/2012   Procedure: DILATATION & CURETTAGE/HYSTEROSCOPY WITH NOVASURE ABLATION;  Surgeon: Lovenia Kim, MD;  Location: [facility];  Service: Gynecology;  Laterality: N/A;  . MOUTH SURGERY    . SINUS SURGERY WITH INSTATRAK      There were no vitals filed for this visit.   Subjective Assessment - 10/12/19 1538    Subjective Patient reporting that she feels that her back and neck  are getting stronger. Still having some issues with her finger and noticing some R thumb pain.    Pertinent History post concussive syndrome, lumbago, mitral valve disorder, HLD, asthma, anxiety, anemia    Diagnostic tests 08/11/19 cervical xray: Multilevel degenerative change in the cervical spine. No evidence of acute osseous abnormality. Straightening of normal lordosis, also seen on 2017 cervical spine CT.    Patient Stated Goals "make sure my alignment is not off and work on bath strength"    Currently in Pain? No/denies              [facility] PT Assessment - 10/12/19 0001      AROM   Cervical Flexion 42    Cervical Extension 55    Cervical - Right Side Bend 37    Cervical - Left Side Bend 31    Cervical - Right Rotation 55    Cervical - Left Rotation 60      Strength   Right Shoulder Flexion 4+/5    Right Shoulder ABduction 4+/5    Right Shoulder Internal Rotation 4+/5    Right Shoulder External Rotation 4/5    Left Shoulder Flexion 4/5    Left Shoulder ABduction 4+/5    Left Shoulder Internal Rotation 4+/5    Left Shoulder External Rotation 4/5    Right Hand Grip (  lbs) 8.66   12, 9, 5   Right Hand 3 Point Pinch 3.33 lbs   4, 4, 2   Left Hand Grip (lbs) 7.33   13, 5, 4   Left Hand 3 Point Pinch 3.67 lbs   3, 4, 4           R 4th digit flexion AROM:   MCP: 80 degrees PIP: 75 degrees DIP: 35 degrees              [facility] Adult PT Treatment/Exercise - 10/12/19 0001      Neck Exercises: Machines for Strengthening   UBE (Upper Arm Bike) L2.0 x 43mn forward/3 min back      Shoulder Exercises: Seated   Horizontal ABduction Strengthening;Both;10 reps;Theraband    Theraband Level (Shoulder Horizontal ABduction) Level 1 (Yellow)    Horizontal ABduction Weight (lbs) cues to depress shoulders    External Rotation Strengthening;Both;10 reps;Theraband    Theraband Level (Shoulder External Rotation) Level 2 (Red)                  PT Education - 10/12/19  1613    Education Details update to HEP; discussion on objective progress and remaining impairments    Person(s) Educated Patient    Methods Explanation;Demonstration;Tactile cues;Verbal cues;Handout    Comprehension Verbalized understanding;Returned demonstration            PT Short Term Goals - 10/12/19 1542      PT SHORT TERM GOAL #1   Title Independent with initial HEP.    Time 3    Period Weeks    Status Achieved    Target Date 09/13/19             PT Long Term Goals - 10/12/19 1542      PT LONG TERM GOAL #1   Title Independent with advanced HEP.    Time 6    Period Weeks    Status Achieved      PT LONG TERM GOAL #2   Title Patient to demonstrate cervical AROM WFL and without pain limiting.    Time 6    Period Weeks    Status Achieved      PT LONG TERM GOAL #3   Title Patient to demonstrate B shoulder strength >/=4+/5.    Time 6    Period Weeks    Status Partially Met   improvement in B shoulder abduction, R shoulder flexion, L shoulder IR     PT LONG TERM GOAL #4   Title Patient to demonstrate R full handed/3 point pinch grip strength symmetrical to opposite UE.    Time 6    Period Weeks    Status Partially Met   small decline in full handed grip, improvement in 3 pt pinch on R     PT LONG TERM GOAL #5   Title Patient to demonstrate R 4th digit AROM WFL in order to make a closed fist.    Time 6    Period Weeks    Status Not Met      PT LONG TERM GOAL #6   Title Patient to return to fitness regimen without pain.    Time 6    Period Weeks    Status Achieved   been practicing "yoga sporadically throughout the day"                Plan - 10/12/19 1803    Clinical Impression Statement Patient reporting that she feels that her back and  neck are getting stronger. Still having some issues with her finger and noticing some R thumb pain. However, notes that she feels ready to transition to home program at this time as she is returning to work  tomorrow. Advised patient to continue with consistent HEP compliance despite being busy with work. Patient has now met cervical AROM goal. Strength revealed improvement in B shoulder abduction, R shoulder flexion, and L shoulder IR, with most limitation still remaining in B shoulder ER. Full handed grip strength seemed to decline in B hands, however with slight improvement in 3 point pinch grip strength on R hand. R 4th digit flexion AROM has shown slight decline. Worked on reviewing and updating HEP to address patient's remaining impairments. Patient reported understanding and without complaints at end of session. Patient has shown some improvement with therapy and now on 30 day hold per her request.    Comorbidities post concussive syndrome, lumbago, mitral valve disorder, HLD, asthma, anxiety, anemia    Rehab Potential Good    PT Frequency 1x / week    PT Treatment/Interventions ADLs/Self Care Home Management;Cryotherapy;Electrical Stimulation;Moist Heat;Traction;Therapeutic exercise;Therapeutic activities;Functional mobility training;Ultrasound;Neuromuscular re-education;Patient/family education;Manual techniques;Taping;Energy conservation;Vasopneumatic Device;Dry needling;Passive range of motion    PT Next Visit Plan 30 day hold at this time    Consulted and Agree with Plan of Care Patient           Patient will benefit from skilled therapeutic intervention in order to improve the following deficits and impairments:  Decreased activity tolerance, Decreased strength, Increased fascial restricitons, Impaired UE functional use, Pain, Increased muscle spasms, Decreased range of motion, Improper body mechanics, Postural dysfunction, Impaired flexibility  Visit Diagnosis: Cervicalgia  Finger joint stiff, right  Muscle weakness (generalized)  Other symptoms and signs involving the musculoskeletal system     Problem List Patient Active Problem List   Diagnosis Date Noted  . Neck pain  09/25/2019  . Depression, major, single episode, moderate ([place]) 09/22/2019  . Educated about COVID-19 virus infection 06/21/2019  . Insomnia 02/19/2019  . Dysuria 01/13/2019  . Anxiety 01/13/2019  . Cough 07/25/2018  . Perimenopausal 01/02/2018  . History of Helicobacter pylori infection 12/28/2017  . Nocturia 07/19/2016  . Rash and nonspecific skin eruption 03/21/2016  . Syncope 01/08/2016  . Osteoarthritis 07/08/2015  . Hematuria 01/20/2015  . Hyperlipidemia, mild 01/20/2015  . Seasonal and perennial allergic rhinitis 01/20/2015  . Fatigue 10/24/2014  . Sinusitis 10/11/2014  . Preventative health care 06/03/2014  . Diverticulosis 07/01/2011  . Menopausal state 03/25/2011  . Fibroids 10/24/2010  . Anemia 03/25/2010  . IRRITABLE BOWEL SYNDROME 03/25/2010  . Mitral valve disorder 07/09/2007  . Allergic asthma, mild intermittent, uncomplicated 22/33/6122  . ESOPHAGEAL REFLUX 07/09/2007  . DERMATITIS 07/09/2007  . LOW BACK PAIN 12/14/2006     Janene Harvey, PT, DPT 10/12/19 6:06 PM   [facility] [address]  [place], [place], [address] Phone: [phone]   Fax:  [phone]  Name: Julian Askin MRN: [id number] Date of Birth: Nov 30, 1965   PHYSICAL THERAPY DISCHARGE SUMMARY  Visits from Start of Care: 7  Current functional level related to goals / functional outcomes: See above clinical impression   Remaining deficits: Decreased shoulder and grip strength, decreased finger ROM   Education / Equipment: HEP  Plan: Patient agrees to discharge.  Patient goals were partially met. Patient is being discharged due to the patient's request.  ?????     Janene Harvey, PT, DPT 11/30/19 3:56 PM

## 2019-10-12 NOTE — Telephone Encounter (Signed)
Letter created by sheketia & placed at front desk for patient to pick up, she plans on picking up letter today(6/17)

## 2019-10-12 NOTE — Telephone Encounter (Signed)
Yes she can have the letter. Can fix the dates when form gets here.

## 2019-10-12 NOTE — Telephone Encounter (Signed)
Caller : [name] Call Back # [phone]  Patient states her company is faxing FMLA paperwork back so the intermittent days could be fixed should be 08-23-19 - 11-19-19. Patient states she needs a return to work note today by 2:00pm.

## 2019-10-19 ENCOUNTER — Encounter: Payer: 59 | Admitting: Family Medicine

## 2019-10-20 ENCOUNTER — Ambulatory Visit: Payer: 59 | Admitting: Psychology

## 2019-10-23 ENCOUNTER — Encounter: Payer: Self-pay | Admitting: Family Medicine

## 2019-10-23 ENCOUNTER — Telehealth: Payer: Self-pay | Admitting: Family Medicine

## 2019-10-23 ENCOUNTER — Other Ambulatory Visit: Payer: Self-pay

## 2019-10-23 ENCOUNTER — Telehealth (INDEPENDENT_AMBULATORY_CARE_PROVIDER_SITE_OTHER): Payer: 59 | Admitting: Family Medicine

## 2019-10-23 DIAGNOSIS — F419 Anxiety disorder, unspecified: Secondary | ICD-10-CM

## 2019-10-23 DIAGNOSIS — R413 Other amnesia: Secondary | ICD-10-CM | POA: Diagnosis not present

## 2019-10-23 DIAGNOSIS — K219 Gastro-esophageal reflux disease without esophagitis: Secondary | ICD-10-CM | POA: Diagnosis not present

## 2019-10-23 DIAGNOSIS — G4700 Insomnia, unspecified: Secondary | ICD-10-CM

## 2019-10-23 NOTE — Telephone Encounter (Signed)
Patient dropped off results from test. Patient inquiring If breathe test is required / please advise

## 2019-10-24 NOTE — Telephone Encounter (Signed)
Patient notified

## 2019-10-24 NOTE — Telephone Encounter (Signed)
Negative result does not need breath test

## 2019-10-24 NOTE — Telephone Encounter (Signed)
Test results placed in yellow folder.

## 2019-10-30 DIAGNOSIS — R413 Other amnesia: Secondary | ICD-10-CM | POA: Insufficient documentation

## 2019-10-30 NOTE — Assessment & Plan Note (Signed)
She has an appointment with neurology on August 15

## 2019-10-30 NOTE — Assessment & Plan Note (Signed)
Avoid offending foods, start probiotics. Do not eat large meals in late evening and consider raising head of bed.  

## 2019-10-30 NOTE — Assessment & Plan Note (Signed)
Encouraged good sleep hygiene such as dark, quiet room. No blue/green glowing lights such as computer screens in bedroom. No alcohol or stimulants in evening. Cut down on caffeine as able. Regular exercise is helpful but not just prior to bed time.  

## 2019-10-30 NOTE — Assessment & Plan Note (Signed)
She continues to struggle with stress especially at work. She is trying to manage the stress and return to work part time

## 2019-10-30 NOTE — Progress Notes (Signed)
Virtual Visit via phone Note  I connected with [name] on 10/23/19 at  2:20 PM EDT by a phone enabled telemedicine application and verified that I am speaking with the correct person using two identifiers.  Location: Patient: patient Provider: provider, both were in the visit   I discussed the limitations of evaluation and management by telemedicine and the availability of in person appointments. The patient expressed understanding and agreed to proceed. Kem Boroughs, CMA was able to get the patient set up on a phone visit after being unable to set up on video visit    Subjective:    Patient ID: [name], female    DOB: 10/27/1965, 54 y.o.   MRN: [id number]  Chief Complaint  Patient presents with  . follow up panic attacks    HPI Patient is in today for follow up on chronic medical concerns. No recent febrile illness or hospitalizations. She continues to struggle with anxiety, depression, insomnia and is apprehensive about working more. Denies CP/palp/SOB/HA/congestion/fevers/GI or GU c/o. Taking meds as prescribed. She has an appointment  With neurology on 8/15 for her memory concerns.   Past Medical History:  Diagnosis Date  . Allergic rhinitis   . Anemia   . Anxiety   . Asthma   . Contact dermatitis and eczema   . Diverticulitis    CT Scan  . Esophageal reflux   . H. pylori infection   . Hyperlipidemia, mixed   . IBS (irritable bowel syndrome)   . IC (interstitial cystitis)   . Internal hemorrhoids   . Lumbago   . Mitral valve disorders(424.0)   . Osteoarthritis 07/08/2015  . Post concussive syndrome   . Seasonal and perennial allergic rhinitis 01/20/2015    Past Surgical History:  Procedure Laterality Date  . BARTHOLIN CYST MARSUPIALIZATION N/A 12/18/2013   Procedure: BARTHOLIN CYST MARSUPIALIZATION WITH BIOPSY;  Surgeon: Lovenia Kim, MD;  Location: [facility];  Service: Gynecology;  Laterality: N/A;  . CESAREAN SECTION    . DILITATION &  CURRETTAGE/HYSTROSCOPY WITH NOVASURE ABLATION N/A 10/14/2012   Procedure: DILATATION & CURETTAGE/HYSTEROSCOPY WITH NOVASURE ABLATION;  Surgeon: Lovenia Kim, MD;  Location: [facility];  Service: Gynecology;  Laterality: N/A;  . MOUTH SURGERY    . SINUS SURGERY WITH INSTATRAK      Family History  Problem Relation Age of Onset  . Colon polyps Sister   . Mental illness Sister        anxiety from 9/11 survivors  . Varicose Veins Sister   . Osteoporosis Sister   . Diabetes Mother   . Hypertension Mother   . Heart attack Mother        MI at age 36  . Alcohol abuse Mother        smoker  . Heart disease Mother   . Dementia Mother   . Hypertension Father   . Hyperlipidemia Father   . Heart attack Father   . Cancer Father        lung cancer, smoker  . Cancer Brother        acute leukemia  . Dementia Maternal Grandmother   . Heart disease Maternal Grandfather        mi  . Cancer Paternal Grandmother        GYN cancer  . Other Sister        hypoglycemia  . Varicose Veins Sister   . Rheum arthritis Sister   . Pleurisy Sister   . Fibromyalgia Sister   . Mental retardation  Sister        depression  . GER disease Son   . Alcohol abuse Other        Family history  . Colon cancer Neg Hx   . Breast cancer Neg Hx     Social History   Socioeconomic History  . Marital status: Married    Spouse name: Not on file  . Number of children: 2  . Years of education: Masters  . Highest education level: Not on file  Occupational History  . Occupation: [employer]  Tobacco Use  . Smoking status: Never Smoker  . Smokeless tobacco: Never Used  Vaping Use  . Vaping Use: Never used  Substance and Sexual Activity  . Alcohol use: Not Currently  . Drug use: No  . 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 Not on file  Social History Narrative   Lives at home home with husband  and son.   Right-handed.   1 cup caffeine daily.   Social Determinants of Health   Financial Resource Strain:   . Difficulty of Paying Living Expenses:   Food Insecurity:   . Worried About Charity fundraiser in the Last Year:   . Arboriculturist in the Last Year:   Transportation Needs:   . Film/video editor (Medical):   Marland Kitchen Lack of Transportation (Non-Medical):   Physical Activity:   . Days of Exercise per Week:   . Minutes of Exercise per Session:   Stress:   . Feeling of Stress :   Social Connections:   . Frequency of Communication with Friends and Family:   . Frequency of Social Gatherings with Friends and Family:   . Attends Religious Services:   . Active Member of Clubs or Organizations:   . Attends Archivist Meetings:   Marland Kitchen Marital Status:   Intimate Partner Violence:   . Fear of Current or Ex-Partner:   . Emotionally Abused:   Marland Kitchen Physically Abused:   . Sexually Abused:     Outpatient Medications Prior to Visit  Medication Sig Dispense Refill  . hydrOXYzine (ATARAX/VISTARIL) 10 MG tablet Take 1-2 tablets (10-20 mg total) by mouth every 8 (eight) hours as needed for anxiety. 60 tablet 2  . Multiple Vitamins-Minerals (MULTIVITAMIN ADULT PO) Take 1 tablet by mouth daily.    . Omega-3 Fatty Acids (FISH OIL) 1000 MG CAPS Take by mouth.    Marland Kitchen OVER THE COUNTER MEDICATION Calcium    . Probiotic Product (PROBIOTIC DAILY PO) Take 1 tablet by mouth daily.    . sertraline (ZOLOFT) 25 MG tablet Take 0.5-1 tablets (12.5-25 mg total) by mouth daily. 30 tablet 3  . sodium chloride (OCEAN) 0.65 % SOLN nasal spray Place 1 spray into both nostrils as needed for congestion.     No facility-administered medications prior to visit.    Allergies  Allergen Reactions  . Amoxicillin Shortness Of Breath    Doesn't remember rxn  . Aspirin Shortness Of Breath    "irritated stomach" Ibuprofen and Naproxen do not agree with her either  . Codeine Hives and Shortness Of Breath      Does not take percocet or hydrocodone; tylenol only  . Latex Shortness Of Breath and Itching  . Other Shortness Of Breath    peanuts  . Sulfa Antibiotics Hives and Shortness Of Breath  . Sulfamethoxazole-Trimethoprim  Shortness Of Breath  . Sulfonamide Derivatives Shortness Of Breath and Itching  . Sulphadimidine Sodium [Sulfamethazine Sodium] Shortness Of Breath    Added to food for preservative  . Ciprofloxacin Other (See Comments)    REACTION: SOB, "Tightness in head" REACTION: SOB, "Tightness in head" Syncope   . Azithromycin Other (See Comments)    Faintness  . Carisoprodol   . Clarithromycin     "Doesn't agree with me"  . Epinephrine     Heart racing  . Fenofibrate Other (See Comments)    Leg cramps, body aches   . Metronidazole Other (See Comments)    "cannot tolerate"  . Nsaids   . Penicillins     Doesn't remember reaction  . Tolmetin   . Omeprazole Palpitations    Review of Systems  Constitutional: Positive for malaise/fatigue. Negative for fever.  HENT: Negative for congestion.   Eyes: Negative for blurred vision.  Respiratory: Negative for shortness of breath.   Cardiovascular: Negative for chest pain, palpitations and leg swelling.  Gastrointestinal: Negative for abdominal pain, blood in stool and nausea.  Genitourinary: Negative for dysuria and frequency.  Musculoskeletal: Negative for falls.  Skin: Negative for rash.  Neurological: Negative for dizziness, loss of consciousness and headaches.  Endo/Heme/Allergies: Negative for environmental allergies.  Psychiatric/Behavioral: Positive for depression and memory loss. The patient is nervous/anxious.        Objective:    Physical Exam  BP 115/66   Pulse 80   Temp 98.4 F (36.9 C)  Wt Readings from Last 3 Encounters:  09/12/19 103 lb 3.2 oz (46.8 kg)  08/11/19 106 lb 12.8 oz (48.4 kg)  07/10/19 107 lb (48.5 kg)    Diabetic Foot Exam - Simple   No data filed     Lab Results  Component  Value Date   WBC 5.4 08/14/2019   HGB 13.8 08/14/2019   HCT 40.2 08/14/2019   PLT 295.0 08/14/2019   GLUCOSE 72 08/14/2019   CHOL 199 08/14/2019   TRIG 82.0 08/14/2019   HDL 58.10 08/14/2019   LDLDIRECT 143.0 01/17/2019   LDLCALC 125 (H) 08/14/2019   ALT 13 08/14/2019   AST 15 08/14/2019   NA 140 08/14/2019   K 4.0 08/14/2019   CL 103 08/14/2019   CREATININE 0.62 08/14/2019   BUN 17 08/14/2019   CO2 29 08/14/2019   TSH 2.27 08/14/2019   HGBA1C 5.0 08/14/2019    Lab Results  Component Value Date   TSH 2.27 08/14/2019   Lab Results  Component Value Date   WBC 5.4 08/14/2019   HGB 13.8 08/14/2019   HCT 40.2 08/14/2019   MCV 98.4 08/14/2019   PLT 295.0 08/14/2019   Lab Results  Component Value Date   NA 140 08/14/2019   K 4.0 08/14/2019   CO2 29 08/14/2019   GLUCOSE 72 08/14/2019   BUN 17 08/14/2019   CREATININE 0.62 08/14/2019   BILITOT 1.0 08/14/2019   ALKPHOS 86 08/14/2019   AST 15 08/14/2019   ALT 13 08/14/2019   PROT 7.0 08/14/2019   ALBUMIN 4.5 08/14/2019   CALCIUM 9.6 08/14/2019   ANIONGAP 10 01/09/2019   GFR 100.19 08/14/2019   Lab Results  Component Value Date   CHOL 199 08/14/2019   Lab Results  Component Value Date   HDL 58.10 08/14/2019   Lab Results  Component Value Date   LDLCALC 125 (H) 08/14/2019   Lab Results  Component Value Date   TRIG 82.0 08/14/2019   Lab Results  Component Value Date   CHOLHDL 3 08/14/2019   Lab Results  Component Value Date   HGBA1C 5.0 08/14/2019       Assessment & Plan:   Problem List Items Addressed This Visit    ESOPHAGEAL REFLUX    Avoid offending foods, start probiotics. Do not eat large meals in late evening and consider raising head of bed.       Anxiety    She continues to struggle with stress especially at work. She is trying to manage the stress and return to work part time      Insomnia    Encouraged good sleep hygiene such as dark, quiet room. No blue/green glowing lights such  as computer screens in bedroom. No alcohol or stimulants in evening. Cut down on caffeine as able. Regular exercise is helpful but not just prior to bed time.       Memory loss    She has an appointment with neurology on August 15         I am having [name] maintain her Probiotic Product (PROBIOTIC DAILY PO), OVER THE COUNTER MEDICATION, Multiple Vitamins-Minerals (MULTIVITAMIN ADULT PO), Fish Oil, sodium chloride, sertraline, and hydrOXYzine.  No orders of the defined types were placed in this encounter.  I discussed the assessment and treatment plan with the patient. The patient was provided an opportunity to ask questions and all were answered. The patient agreed with the plan and demonstrated an understanding of the instructions.   The patient was advised to call back or seek an in-person evaluation if the symptoms worsen or if the condition fails to improve as anticipated.  I provided 15 minutes of non-face-to-face time during this encounter.  Penni Homans, MD

## 2019-11-01 ENCOUNTER — Ambulatory Visit: Payer: 59 | Admitting: Psychology

## 2019-11-06 ENCOUNTER — Ambulatory Visit: Payer: 59 | Admitting: Neurology

## 2019-11-07 ENCOUNTER — Other Ambulatory Visit: Payer: Self-pay

## 2019-11-07 ENCOUNTER — Telehealth: Payer: 59 | Admitting: Family Medicine

## 2019-11-08 ENCOUNTER — Ambulatory Visit (INDEPENDENT_AMBULATORY_CARE_PROVIDER_SITE_OTHER): Payer: 59 | Admitting: Psychology

## 2019-11-08 DIAGNOSIS — F4322 Adjustment disorder with anxiety: Secondary | ICD-10-CM

## 2019-11-16 NOTE — Telephone Encounter (Signed)
Close this encounter

## 2019-11-17 ENCOUNTER — Other Ambulatory Visit: Payer: Self-pay

## 2019-11-17 ENCOUNTER — Telehealth (INDEPENDENT_AMBULATORY_CARE_PROVIDER_SITE_OTHER): Payer: 59 | Admitting: Family Medicine

## 2019-11-17 DIAGNOSIS — R55 Syncope and collapse: Secondary | ICD-10-CM

## 2019-11-17 DIAGNOSIS — F321 Major depressive disorder, single episode, moderate: Secondary | ICD-10-CM | POA: Diagnosis not present

## 2019-11-17 DIAGNOSIS — E785 Hyperlipidemia, unspecified: Secondary | ICD-10-CM | POA: Diagnosis not present

## 2019-11-17 DIAGNOSIS — K5900 Constipation, unspecified: Secondary | ICD-10-CM

## 2019-11-17 DIAGNOSIS — K589 Irritable bowel syndrome without diarrhea: Secondary | ICD-10-CM

## 2019-11-17 DIAGNOSIS — G4700 Insomnia, unspecified: Secondary | ICD-10-CM

## 2019-11-17 DIAGNOSIS — F419 Anxiety disorder, unspecified: Secondary | ICD-10-CM | POA: Diagnosis not present

## 2019-11-17 DIAGNOSIS — F329 Major depressive disorder, single episode, unspecified: Secondary | ICD-10-CM

## 2019-11-17 DIAGNOSIS — M25561 Pain in right knee: Secondary | ICD-10-CM

## 2019-11-17 NOTE — Assessment & Plan Note (Signed)
Continue current therapy and report any new concerns.

## 2019-11-17 NOTE — Progress Notes (Signed)
Virtual Visit via Video Note  I connected with [name] on 11/19/19 at  9:40 AM EDT by a video enabled telemedicine application and verified that I am speaking with the correct person using two identifiers.  Location: Patient: home, patient and provider in visit Provider: home   I discussed the limitations of evaluation and management by telemedicine and the availability of in person appointments. The patient expressed understanding and agreed to proceed. Kem Boroughs, CMA was able to get the patient set up on a video visit    Subjective:    Patient ID: [name], female    DOB: 03-22-1966, 54 y.o.   MRN: [id number]  Chief Complaint  Patient presents with  . Follow-up    HPI Patient is in today for follow up on chronic medical concerns. No recent febrile illness or hospitalizations. She has returned to work and is struggling with depression, anxiety, malaise but is managing most days. She has had a couple of episodes of her right knee buckling and feeling weak but she denies any reent fall or trauma. She has recently had an endoscopy with Dr Wallis Mart of GI still have some abdominal discomfort at times. No bloody or tarry stool. Denies CP/palp/SOB/HA/congestion/fevers or GU c/o. Taking meds as prescribed  Past Medical History:  Diagnosis Date  . Allergic rhinitis   . Anemia   . Anxiety   . Asthma   . Contact dermatitis and eczema   . Diverticulitis    CT Scan  . Esophageal reflux   . H. pylori infection   . Hyperlipidemia, mixed   . IBS (irritable bowel syndrome)   . IC (interstitial cystitis)   . Internal hemorrhoids   . Lumbago   . Mitral valve disorders(424.0)   . Osteoarthritis 07/08/2015  . Post concussive syndrome   . Seasonal and perennial allergic rhinitis 01/20/2015    Past Surgical History:  Procedure Laterality Date  . BARTHOLIN CYST MARSUPIALIZATION N/A 12/18/2013   Procedure: BARTHOLIN CYST MARSUPIALIZATION WITH BIOPSY;  Surgeon: Lovenia Kim, MD;  Location: [facility];  Service: Gynecology;  Laterality: N/A;  . CESAREAN SECTION    . DILITATION & CURRETTAGE/HYSTROSCOPY WITH NOVASURE ABLATION N/A 10/14/2012   Procedure: DILATATION & CURETTAGE/HYSTEROSCOPY WITH NOVASURE ABLATION;  Surgeon: Lovenia Kim, MD;  Location: [facility];  Service: Gynecology;  Laterality: N/A;  . MOUTH SURGERY    . SINUS SURGERY WITH INSTATRAK      Family History  Problem Relation Age of Onset  . Colon polyps Sister   . Mental illness Sister        anxiety from 9/11 survivors  . Varicose Veins Sister   . Osteoporosis Sister   . Diabetes Mother   . Hypertension Mother   . Heart attack Mother        MI at age 43  . Alcohol abuse Mother        smoker  . Heart disease Mother   . Dementia Mother   . Hypertension Father   . Hyperlipidemia Father   . Heart attack Father   . Cancer Father        lung cancer, smoker  . Cancer Brother        acute leukemia  . Dementia Maternal Grandmother   . Heart disease Maternal Grandfather        mi  . Cancer Paternal Grandmother        GYN cancer  . Other Sister        hypoglycemia  . Varicose  Veins Sister   . Rheum arthritis Sister   . Pleurisy Sister   . Fibromyalgia Sister   . Mental retardation Sister        depression  . GER disease Son   . Alcohol abuse Other        Family history  . Colon cancer Neg Hx   . Breast cancer Neg Hx     Social History   Socioeconomic History  . Marital status: Married    Spouse name: Not on file  . Number of children: 2  . Years of education: Masters  . Highest education level: Not on file  Occupational History  . Occupation: [employer]  Tobacco Use  . Smoking status: Never Smoker  . Smokeless tobacco: Never Used  Vaping Use  . Vaping Use: Never used  Substance and Sexual Activity  . Alcohol use: Not Currently  . Drug use: No  . 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 Not on file  Social History Narrative   Lives at home home with husband and son.   Right-handed.   1 cup caffeine daily.   Social Determinants of Health   Financial Resource Strain:   . Difficulty of Paying Living Expenses:   Food Insecurity:   . Worried About Charity fundraiser in the Last Year:   . Arboriculturist in the Last Year:   Transportation Needs:   . Film/video editor (Medical):   Marland Kitchen Lack of Transportation (Non-Medical):   Physical Activity:   . Days of Exercise per Week:   . Minutes of Exercise per Session:   Stress:   . Feeling of Stress :   Social Connections:   . Frequency of Communication with Friends and Family:   . Frequency of Social Gatherings with Friends and Family:   . Attends Religious Services:   . Active Member of Clubs or Organizations:   . Attends Archivist Meetings:   Marland Kitchen Marital Status:   Intimate Partner Violence:   . Fear of Current or Ex-Partner:   . Emotionally Abused:   Marland Kitchen Physically Abused:   . Sexually Abused:     Outpatient Medications Prior to Visit  Medication Sig Dispense Refill  . hydrOXYzine (ATARAX/VISTARIL) 10 MG tablet Take 1-2 tablets (10-20 mg total) by mouth every 8 (eight) hours as needed for anxiety. 60 tablet 2  . Multiple Vitamins-Minerals (MULTIVITAMIN ADULT PO) Take 1 tablet by mouth daily.    . Omega-3 Fatty Acids (FISH OIL) 1000 MG CAPS Take by mouth.    Marland Kitchen OVER THE COUNTER MEDICATION Calcium    . Probiotic Product (PROBIOTIC DAILY PO) Take 1 tablet by mouth daily.    . sertraline (ZOLOFT) 25 MG tablet Take 0.5-1 tablets (12.5-25 mg total) by mouth daily. 30 tablet 3  . sodium chloride (OCEAN) 0.65 % SOLN nasal spray Place 1 spray into both nostrils as needed for congestion.     No facility-administered medications prior to visit.    Allergies  Allergen Reactions  . Amoxicillin Shortness Of Breath    Doesn't remember rxn  . Aspirin Shortness Of Breath     "irritated stomach" Ibuprofen and Naproxen do not agree with her either  . Codeine Hives and Shortness Of Breath    Does not take percocet or hydrocodone; tylenol only  . Latex Shortness Of Breath and Itching  .  Other Shortness Of Breath    peanuts  . Sulfa Antibiotics Hives and Shortness Of Breath  . Sulfamethoxazole-Trimethoprim Shortness Of Breath  . Sulfonamide Derivatives Shortness Of Breath and Itching  . Sulphadimidine Sodium [Sulfamethazine Sodium] Shortness Of Breath    Added to food for preservative  . Ciprofloxacin Other (See Comments)    REACTION: SOB, "Tightness in head" REACTION: SOB, "Tightness in head" Syncope   . Azithromycin Other (See Comments)    Faintness  . Carisoprodol   . Clarithromycin     "Doesn't agree with me"  . Epinephrine     Heart racing  . Fenofibrate Other (See Comments)    Leg cramps, body aches   . Metronidazole Other (See Comments)    "cannot tolerate"  . Nsaids   . Penicillins     Doesn't remember reaction  . Tolmetin   . Omeprazole Palpitations    Review of Systems  Constitutional: Positive for malaise/fatigue. Negative for fever.  HENT: Negative for congestion.   Eyes: Negative for blurred vision.  Respiratory: Negative for shortness of breath.   Cardiovascular: Negative for chest pain, palpitations and leg swelling.  Gastrointestinal: Positive for constipation and heartburn. Negative for abdominal pain, blood in stool and nausea.  Genitourinary: Negative for dysuria and frequency.  Musculoskeletal: Positive for joint pain. Negative for falls.  Skin: Negative for rash.  Neurological: Negative for dizziness, loss of consciousness and headaches.  Endo/Heme/Allergies: Negative for environmental allergies.  Psychiatric/Behavioral: Positive for depression. The patient is nervous/anxious.        Objective:    Physical Exam Constitutional:      Appearance: Normal appearance. She is not ill-appearing.  HENT:     Head:  Normocephalic and atraumatic.     Right Ear: External ear normal.     Left Ear: External ear normal.  Eyes:     General:        Right eye: No discharge.        Left eye: No discharge.  Pulmonary:     Effort: Pulmonary effort is normal.  Neurological:     Mental Status: She is alert and oriented to person, place, and time.  Psychiatric:        Behavior: Behavior normal.     There were no vitals taken for this visit. Wt Readings from Last 3 Encounters:  09/12/19 103 lb 3.2 oz (46.8 kg)  08/11/19 106 lb 12.8 oz (48.4 kg)  07/10/19 107 lb (48.5 kg)    Diabetic Foot Exam - Simple   No data filed     Lab Results  Component Value Date   WBC 5.4 08/14/2019   HGB 13.8 08/14/2019   HCT 40.2 08/14/2019   PLT 295.0 08/14/2019   GLUCOSE 72 08/14/2019   CHOL 199 08/14/2019   TRIG 82.0 08/14/2019   HDL 58.10 08/14/2019   LDLDIRECT 143.0 01/17/2019   LDLCALC 125 (H) 08/14/2019   ALT 13 08/14/2019   AST 15 08/14/2019   NA 140 08/14/2019   K 4.0 08/14/2019   CL 103 08/14/2019   CREATININE 0.62 08/14/2019   BUN 17 08/14/2019   CO2 29 08/14/2019   TSH 2.27 08/14/2019   HGBA1C 5.0 08/14/2019    Lab Results  Component Value Date   TSH 2.27 08/14/2019   Lab Results  Component Value Date   WBC 5.4 08/14/2019   HGB 13.8 08/14/2019   HCT 40.2 08/14/2019   MCV 98.4 08/14/2019   PLT 295.0 08/14/2019   Lab Results  Component Value  Date   NA 140 08/14/2019   K 4.0 08/14/2019   CO2 29 08/14/2019   GLUCOSE 72 08/14/2019   BUN 17 08/14/2019   CREATININE 0.62 08/14/2019   BILITOT 1.0 08/14/2019   ALKPHOS 86 08/14/2019   AST 15 08/14/2019   ALT 13 08/14/2019   PROT 7.0 08/14/2019   ALBUMIN 4.5 08/14/2019   CALCIUM 9.6 08/14/2019   ANIONGAP 10 01/09/2019   GFR 100.19 08/14/2019   Lab Results  Component Value Date   CHOL 199 08/14/2019   Lab Results  Component Value Date   HDL 58.10 08/14/2019   Lab Results  Component Value Date   LDLCALC 125 (H) 08/14/2019    Lab Results  Component Value Date   TRIG 82.0 08/14/2019   Lab Results  Component Value Date   CHOLHDL 3 08/14/2019   Lab Results  Component Value Date   HGBA1C 5.0 08/14/2019       Assessment & Plan:   Problem List Items Addressed This Visit    IRRITABLE BOWEL SYNDROME    Continue Miralax and consider benefiber, has been seen by Dr Wallis Mart of GI      Hyperlipidemia, mild    Encouraged heart healthy diet, increase exercise, avoid trans fats, consider a krill oil cap daily      Syncope    No recent episodes      Anxiety and depression - Primary    Is back at work and still struggling but managing. Is considering coming out of work on disability but she is trying to get by. No changes today. Continue current meds and counseling and referred to psychiatry      Insomnia   RESOLVED: Depression, major, single episode, moderate ([place])    Continue current therapy and report any new concerns.       Constipation    Good response to Miralax      Right knee pain    Has had some brief episodes of knee feeling weak and buckling. Encouraged moist heat and gentle stretching as tolerated. May try NSAIDs and prescription meds as directed and report if symptoms worsen or seek immediate care. If no improvement consider referral to sports medicine.       Other Visit Diagnoses    Depression, unspecified depression type       Relevant Orders   Ambulatory referral to Psychiatry      I am having [name] maintain her Probiotic Product (PROBIOTIC DAILY PO), OVER THE COUNTER MEDICATION, Multiple Vitamins-Minerals (MULTIVITAMIN ADULT PO), Fish Oil, sodium chloride, sertraline, and hydrOXYzine.  No orders of the defined types were placed in this encounter.   I discussed the assessment and treatment plan with the patient. The patient was provided an opportunity to ask questions and all were answered. The patient agreed with the plan and demonstrated an understanding of the  instructions.   The patient was advised to call back or seek an in-person evaluation if the symptoms worsen or if the condition fails to improve as anticipated.  I provided 20 minutes of non-face-to-face time during this encounter.   Penni Homans, MD

## 2019-11-17 NOTE — Assessment & Plan Note (Signed)
Good response to Miralax

## 2019-11-17 NOTE — Assessment & Plan Note (Signed)
Encouraged heart healthy diet, increase exercise, avoid trans fats, consider a krill oil cap daily 

## 2019-11-17 NOTE — Assessment & Plan Note (Signed)
No recent episodes

## 2019-11-19 DIAGNOSIS — M25561 Pain in right knee: Secondary | ICD-10-CM | POA: Insufficient documentation

## 2019-11-19 DIAGNOSIS — M79604 Pain in right leg: Secondary | ICD-10-CM | POA: Insufficient documentation

## 2019-11-19 NOTE — Assessment & Plan Note (Addendum)
Continue Miralax and consider benefiber, has been seen by Dr Wallis Mart of GI

## 2019-11-19 NOTE — Assessment & Plan Note (Addendum)
Is back at work and still struggling but managing. Is considering coming out of work on disability but she is trying to get by. No changes today. Continue current meds and counseling and referred to psychiatry

## 2019-11-19 NOTE — Assessment & Plan Note (Signed)
Has had some brief episodes of knee feeling weak and buckling. Encouraged moist heat and gentle stretching as tolerated. May try NSAIDs and prescription meds as directed and report if symptoms worsen or seek immediate care. If no improvement consider referral to sports medicine.

## 2019-11-30 ENCOUNTER — Other Ambulatory Visit: Payer: Self-pay

## 2019-11-30 ENCOUNTER — Encounter: Payer: Self-pay | Admitting: Counselor

## 2019-11-30 ENCOUNTER — Ambulatory Visit (INDEPENDENT_AMBULATORY_CARE_PROVIDER_SITE_OTHER): Payer: 59 | Admitting: Counselor

## 2019-11-30 ENCOUNTER — Ambulatory Visit: Payer: 59

## 2019-11-30 DIAGNOSIS — F418 Other specified anxiety disorders: Secondary | ICD-10-CM | POA: Diagnosis not present

## 2019-11-30 DIAGNOSIS — L568 Other specified acute skin changes due to ultraviolet radiation: Secondary | ICD-10-CM

## 2019-11-30 DIAGNOSIS — F0781 Postconcussional syndrome: Secondary | ICD-10-CM

## 2019-11-30 DIAGNOSIS — F09 Unspecified mental disorder due to known physiological condition: Secondary | ICD-10-CM

## 2019-11-30 NOTE — Progress Notes (Signed)
° °  Psychometrist Note   Cognitive testing was administered to [employer] by Lamar Benes, B.S. (Technician) under the supervision of Alphonzo Severance, Psy.D., ABN. Ms. Cake was able to tolerate all test procedures. Dr. Nicole Kindred met with the patient as needed to manage any emotional reactions to the testing procedures. Rest breaks were offered.    The battery of tests administered was selected by Dr. Nicole Kindred with consideration to the patient's current level of functioning, the nature of her symptoms, emotional and behavioral responses during the interview, level of literacy, observed level of motivation/effort, and the nature of the referral question. This battery was communicated to the psychometrist. Communication between Dr. Nicole Kindred and the psychometrist was ongoing throughout the evaluation and Dr. Nicole Kindred was immediately accessible at all times. Dr. Nicole Kindred provided supervision to the technician on the date of this service, to the extent necessary to assure the quality of all services provided.    Ms. Geeslin will return in approximately one week for an interactive feedback session with Dr. Nicole Kindred, at which time female test performance, clinical impressions, and treatment recommendations will be reviewed in detail. The patient understands she can contact our office should she require our assistance before this time.   A total of 120 minutes of billable time were spent with [name] by the technician, including test administration and scoring time. Billing for these services is reflected in Dr. Les Pou note.   This note reflects time spent with the psychometrician and does not include test scores, clinical history, or any interpretations made by Dr. Nicole Kindred. The full report will follow in a separate note.
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
## 2019-12-05 NOTE — Progress Notes (Signed)
[facility]  Patient Name: [name] MRN: [id number] Date of Birth: 05/16/1965 Age: 53 y.o. Education: 18 years  Measurement properties of test scores: IQ, Index, and Standard Scores (SS): Mean = 100; Standard Deviation = 15 Scaled Scores (Ss): Mean = 10; Standard Deviation = 3 Z scores (Z): Mean = 0; Standard Deviation = 1 T scores (T); Mean = 50; Standard Deviation = 10  TEST SCORES:    Note: This summary of test scores accompanies the interpretive report and should not be interpreted by unqualified individuals or in isolation without reference to the report. Test scores are relative to age, gender, and educational history as available and appropriate.   Performance Validity        ACS: Raw Descriptor      Word Choice: 45 Within Expectation      MSVT: Raw Descriptor      IR 100 Within Expectation      DR 100 Within Expectation      CNS 100 Within Expectation      PA 90 ---      FR 80 ---      The Dot Counting Test: Raw Descriptor      E-Score 9 Within Expectation      Embedded Measures: Raw Descriptor      RBANS Effort Index: 2 Within Expectation      WAIS-IV Reliable Digit Span 9 Within Expectation      WAIS-IV Reliable Digit Span Revised 13 Within Expectation      Expected Functioning        Wide Range Achievement Test (Word Reading): Standard/Scaled Score Percentile       Word Reading 95 37      Cognitive Testing        RBANS, Form : Standard/Scaled Score Percentile  Total Score 64 1  Immediate Memory 61 <1      List Learning 5 5      Story Memory 2 <1  Visuospatial/Constructional 69 2      Figure Copy   (15) 5 5      Judgment of Line Orientation   (10) --- 3-9  Language 75 5      Picture Naming --- 3-9      Semantic Fluency 6 9  Attention 88 21      Digit Span 11 63      Coding 5 5  Delayed Memory 64 1      List Recall   (3) --- 3-9      List Recognition   (15) --- <2      Story Recall   (2) 2 <1       Figure Recall   (10) 7 16      Wechsler Adult Intelligence Scale - IV: Standard/Scaled Score Percentile  Working Memory Index 92 30      Digit Span 8 25          Digit Span Forward 7 16          Digit Span Backward 10 50          Digit Span Sequencing 8 25      Arithmetic 9 37  Processing Speed Index 89 23      Symbol Search 7 16      Coding 9 37      Neuropsychological Assessment Battery (Language Module): T-score Percentile      Naming   (29) 39 14      Verbal Fluency:  T-score Percentile      Controlled Oral Word Association (F-A-S) 42 21      Semantic Fluency (Animals) 25 1      Trail Making Test: T-Score Percentile      Part A 38 12      Part B 42 21      Modified Wisconsin Card Sorting Test (MWCST): Standard/T-Score Percentile      Number of Categories Correct 21 <1      Number of Perseverative Errors 33 5      Number of Total Errors 25 1      Percent Perseverative Errors 50 50  Executive Function Composite 62 1      Boston Diagnostic Aphasia Exam: Raw Score Scaled Score      Complex Ideational Material 10 7      Rating Scales         Raw Score Descriptor  Patient Health Questionnaire - 9 14 Moderate  GAD-7 14 Moderate   Jeydi Klingel V. Nicole Kindred PsyD, [place] Clinical Neuropsychologist

## 2019-12-07 ENCOUNTER — Ambulatory Visit (INDEPENDENT_AMBULATORY_CARE_PROVIDER_SITE_OTHER): Payer: 59 | Admitting: Psychology

## 2019-12-07 ENCOUNTER — Encounter: Payer: 59 | Admitting: Counselor

## 2019-12-07 DIAGNOSIS — F4322 Adjustment disorder with anxiety: Secondary | ICD-10-CM

## 2019-12-11 ENCOUNTER — Encounter: Payer: Self-pay | Admitting: Counselor

## 2019-12-11 ENCOUNTER — Ambulatory Visit (INDEPENDENT_AMBULATORY_CARE_PROVIDER_SITE_OTHER): Payer: 59 | Admitting: Counselor

## 2019-12-11 ENCOUNTER — Other Ambulatory Visit: Payer: Self-pay

## 2019-12-11 DIAGNOSIS — F0781 Postconcussional syndrome: Secondary | ICD-10-CM | POA: Diagnosis not present

## 2019-12-11 DIAGNOSIS — G4700 Insomnia, unspecified: Secondary | ICD-10-CM

## 2019-12-11 DIAGNOSIS — F339 Major depressive disorder, recurrent, unspecified: Secondary | ICD-10-CM

## 2019-12-11 NOTE — Progress Notes (Signed)
[facility]  Telemedicine statement:  I discussed the limitations of neuropsychological care via telemedicine and the availability of in person appointments. The patient expressed understanding and agreed to proceed. The patient was verified with two identifiers.  The visit modality was: telephonic The patient location was: home The provider location was: office  The following individuals participated: [name]  Feedback Note: I met with [name] to review the findings resulting from her neuropsychological evaluation. Since the last appointment, she has been about the same. Time was spent reviewing the impressions and recommendations that are detailed in the evaluation report. We discussed the fact that concussion is a phenomenological experience, and that ongoing postconcussive symptoms are often as much due to lingering worries about full recovery, misattribution of normal cognitive errors, sleep problems, and anxiety/depression as they are related to a brain problem, per se. We discussed sleep hygiene and treatment for her depression/anxiety. She is working with GI on stomach upset and plans to initiate an antidepressant after that is cleared up, in coordination with Dr. Charlett Blake. Other recommendations as reflected in the patient instructions. I took time to explain the findings and answer all the patient's questions. I encouraged [name] to contact me should she have any further questions or if further follow up is desired.   Current Medications and Medical History   Current Outpatient Medications  Medication Sig Dispense Refill  . hydrOXYzine (ATARAX/VISTARIL) 10 MG tablet Take 1-2 tablets (10-20 mg total) by mouth every 8 (eight) hours as needed for anxiety. 60 tablet 2  . Multiple Vitamins-Minerals (MULTIVITAMIN ADULT PO) Take 1 tablet by mouth daily.    . Omega-3 Fatty Acids (FISH OIL) 1000 MG CAPS Take by mouth.    Marland Kitchen OVER THE  COUNTER MEDICATION Calcium    . Probiotic Product (PROBIOTIC DAILY PO) Take 1 tablet by mouth daily.    . sertraline (ZOLOFT) 25 MG tablet Take 0.5-1 tablets (12.5-25 mg total) by mouth daily. 30 tablet 3  . sodium chloride (OCEAN) 0.65 % SOLN nasal spray Place 1 spray into both nostrils as needed for congestion.     No current facility-administered medications for this visit.    Patient Active Problem List   Diagnosis Date Noted  . Right knee pain 11/19/2019  . Constipation 11/17/2019  . Memory loss 10/30/2019  . Neck pain 09/25/2019  . Educated about COVID-19 virus infection 06/21/2019  . Insomnia 02/19/2019  . Dysuria 01/13/2019  . Anxiety and depression 01/13/2019  . Cough 07/25/2018  . Perimenopausal 01/02/2018  . History of Helicobacter pylori infection 12/28/2017  . Nocturia 07/19/2016  . Rash and nonspecific skin eruption 03/21/2016  . Syncope 01/08/2016  . Osteoarthritis 07/08/2015  . Hematuria 01/20/2015  . Hyperlipidemia, mild 01/20/2015  . Seasonal and perennial allergic rhinitis 01/20/2015  . Fatigue 10/24/2014  . Sinusitis 10/11/2014  . Preventative health care 06/03/2014  . Diverticulosis 07/01/2011  . Menopausal state 03/25/2011  . Fibroids 10/24/2010  . Anemia 03/25/2010  . IRRITABLE BOWEL SYNDROME 03/25/2010  . Mitral valve disorder 07/09/2007  . Allergic asthma, mild intermittent, uncomplicated 77/82/4235  . ESOPHAGEAL REFLUX 07/09/2007  . DERMATITIS 07/09/2007  . LOW BACK PAIN 12/14/2006    Mental Status and Behavioral Observations  [name] was available at the prespecified time for this telephonic appointment and was alert and generally oriented (orientation not formally assessed). Speech was normal in rate, rhythm, volume, and prosody. Self-reported mood was "fine" and affect as assessed by vocal quality was mainly neutral.  Thought process was logical and goal oriented and thought content was appropriate. There were no safety concerns  identified at today's encounter, such as thoughts of harming self or others.   Plan  Feedback provided regarding the patient's neuropsychological evaluation. We had a long discussion about factors that can give rise to and maintain postconcussive symptoms, including difficulties with depression/anxiety, sleep problems, and worry about recovery. She presented as reassured by the conversation. I was also frank with her that she did have a number of low scores and it would not be inappropriate to order an updated MRI of the brain just to make sure nothing is being missed. [name] was encouraged to contact me if any questions arise or if further follow up is desired.   Viviano Simas Nicole Kindred, PsyD, ABN Clinical Neuropsychologist  Service(s) Provided at This Encounter: 33 minutes [phone]; Psychotherapy with patient/family)
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
## 2019-12-11 NOTE — Patient Instructions (Signed)
Your performance and presentation on assessment today were consistent with less than expected performance on some tests yet fairly good performance on other analogous tests. You did have consistent difficulties in some areas such as with measures of memory and semantic verbal fluency. I have a low index of suspicion for an underlying condition such as a progressive brain disease given your young age, the pattern of test scores, and your description of day-to-day symptoms. I think it is more likely that your ongoing issues with depression, anxiety, and post-concussive symptoms (e.g., difficulty working on screens, light sensitivity, etc.) are to blame.   Despite the fact that an underlying "organic" cause seems unlikely, it would not be unreasonable for one of your medical providers to order an updated MRI of the brain to make sure that nothing is being missed.   When an individual strikes their head and experiences an alteration of consciousness or a brief loss of consciousness without complications like a brain bleed, this is called a concussion or "mild traumatic brain injury." This is not a brain injury per se in terms of causing structural damage to the brain, but it does negatively impact the brain's functioning in terms of energy metabolism (i.e, it is a metabolic injury). Most people experience cognitive symptoms following a concussion, and these typically resolve within hours to days. The vast majority of individuals recover completely in a matter of time, without any measurable lasting cognitive impairments. A small minority of people may experience persisting symptoms, the so-called "postconcussion syndrome," and there is debate about why this is the case. Oftentimes, the symptoms that people report are things that happen with a relatively high frequency in the general population (e.g., things like irritability, difficulties concentrating, and headaches). These things are also associated with  depression and anxiety, which are two of the biggest risk factors risk factors for postconcussion syndrome. Things like changes in routines, pain related to headaches, difficulties readjusting to work and normal activities, financial stress, depression, and anxiety about full recovery may be more important precipitants of postconcussive symptoms than the brain injury itself. My best recommendation is to resume normal levels of activity as tolerated after you are medically cleared to do so and not to overly focus on cognitive problems, which will only make them worse by distracting you and detracting attention from the task at hand.    Depression can affect cognitive functioning in several ways. For one, there are neurobiological changes in depression that can contribute to attention, concentration, and memory problems. These changes are so common they are part of the criteria we use to diagnose depressive disorders. Depression can also negatively impact your own appraisal of your cognitive abilities, leading you to feel like you are performing more poorly than is objectively warranted.  If your difficulties are related to psychiatric problems, then getting your depression and anxiety under better control are the best way to procede in terms of treatment. You are already working with Dennison Bulla in psychotherapy, and I suggest that you continue. You may also wish to consult with Dr. Charlett Blake to see if your medication can be optimized.   There are few things as disruptive to brain functioning as not getting a good night's sleep. For sleep, I recommend against using medications, which can have lingering sedating effects on the brain and rob your brain of restful REM sleep. Instead, consider trying some of the following sleep hygiene recommendations. They may not work at once and may take effort, but the effort you spend  is likely to be rewarded with better sleep eventually:   Stick to a sleep schedule of the  same bedtime and wake time even on the weekends, which can help to regulate your body's internal clock so that you fall asleep and stay asleep.   Practice a relaxing bedtime ritual (conducted away from bright lights) which will help separate your sleep from stimulating activities and prepare your body to fall asleep when you go to bed.   Avoid naps, especially in the afternoon.   Evaluate your room and create conditions that will promote sleep such as keeping it cool (between 60 - 67 degrees), quiet, and free from any lights. Consider using blackout curtains, a "white noise" generator, or fan that will help mask any noises that might prevent you from going to sleep or awaken you during the night.   Sleep on a comfortable mattress and pillows.   Avoid bright light in the evening and excessive use of portable electronic devices right before bed that may contain light frequencies that can contribute to sleep problems.   Avoid alcohol, cigarettes, or heavy meals in the evening. If you must eat, consume a light snack 45 minutes before bed.   Use your bed only for sleep to strengthen the association between your bed and sleep.   If you can't go to sleep within 30 minutes, go into another room and do something relaxing until you feel tired. Then, come back and try to go to sleep again for 30 minutes and repeat until sleep is achieved.   Some people find over the counter melatonin to be helpful for sleep, which you could discuss with a pharmacist or prescribing provider.   Perhaps most importantly, I suggest that you avoid overfocusing on cognitive performance. Memory and cognition are notoriously fallible and if you are looking for cognitive problems, you are bound to find them. Once someone gets worried about their memory and thinking, they may overfocus on how they are doing day-to-day, and then when normal day-to-day cognitive errors are made, this becomes a cause for more concern. This concern and  anxiety then decreases focus from the task at hand, reducing concentration, causing more cognitive problems, and creating a vicious cycle. Rather than critiquing your performance, I would encourage you to remain present minded and focus on the task at hand. Have reasonable expectations for yourself.  Healthy people forget things, lose focus, and do not perform 100% correctly all the time. Some cognitive errors are normal and are not necessarily a sign that there is something wrong with your brain.

## 2019-12-12 ENCOUNTER — Telehealth: Payer: Self-pay | Admitting: Family Medicine

## 2019-12-12 NOTE — Telephone Encounter (Signed)
OK to write patient letter stating unable to sit for jury duty due to severe anxiety and depression.

## 2019-12-12 NOTE — Telephone Encounter (Signed)
Pt needs a letter from pcp stating she can't do jury duty because she is under dr's care for anxiety and depression. She needs it as soon as possible so she can send with the form.

## 2019-12-13 NOTE — Telephone Encounter (Signed)
Letter placed in Mychart and sent to patient's home per patient request

## 2019-12-19 ENCOUNTER — Encounter: Payer: Self-pay | Admitting: *Deleted

## 2019-12-22 ENCOUNTER — Telehealth: Payer: Self-pay | Admitting: Family Medicine

## 2019-12-22 NOTE — Telephone Encounter (Signed)
Caller: [name] Call Back # [phone]  Patient states she saw neurologist. Patient wants to make sure that we have the report.   Please Advise  Patient would like to discuss report with [name].

## 2019-12-22 NOTE — Telephone Encounter (Signed)
Patient saw Neurologist on 8/16 and she wanted your feedback on his notes.

## 2019-12-27 ENCOUNTER — Ambulatory Visit (INDEPENDENT_AMBULATORY_CARE_PROVIDER_SITE_OTHER): Payer: 59 | Admitting: Psychology

## 2019-12-27 DIAGNOSIS — F4322 Adjustment disorder with anxiety: Secondary | ICD-10-CM | POA: Diagnosis not present

## 2019-12-28 ENCOUNTER — Telehealth: Payer: Self-pay | Admitting: Family Medicine

## 2019-12-28 NOTE — Telephone Encounter (Signed)
Please write her requested work note to work from home for the week while under treatment for GI condition. Also Ok to release requested info

## 2019-12-28 NOTE — Telephone Encounter (Signed)
Caller : [name]  Call Back # [phone]  Patient is requesting that Dr, Charlett Blake write a note for her to work from home while she is under treatment for gastro condition. Patient states has to take  strong medicine unable to drive to work. Patient is already working from home a few days out of the week but, needs to but out for the whole week.   Please Advise

## 2019-12-28 NOTE — Telephone Encounter (Signed)
Patient called a second time to let Dr Charlett Blake, that her employer will be sending in a release of information. To finalize her short term disability.

## 2019-12-29 ENCOUNTER — Encounter: Payer: Self-pay | Admitting: *Deleted

## 2019-12-29 ENCOUNTER — Telehealth (INDEPENDENT_AMBULATORY_CARE_PROVIDER_SITE_OTHER): Payer: 59 | Admitting: Family Medicine

## 2019-12-29 ENCOUNTER — Other Ambulatory Visit: Payer: Self-pay

## 2019-12-29 DIAGNOSIS — F32A Depression, unspecified: Secondary | ICD-10-CM

## 2019-12-29 DIAGNOSIS — F0781 Postconcussional syndrome: Secondary | ICD-10-CM | POA: Diagnosis not present

## 2019-12-29 DIAGNOSIS — F329 Major depressive disorder, single episode, unspecified: Secondary | ICD-10-CM | POA: Diagnosis not present

## 2019-12-29 DIAGNOSIS — Z8619 Personal history of other infectious and parasitic diseases: Secondary | ICD-10-CM

## 2019-12-29 DIAGNOSIS — F419 Anxiety disorder, unspecified: Secondary | ICD-10-CM | POA: Diagnosis not present

## 2019-12-29 DIAGNOSIS — R519 Headache, unspecified: Secondary | ICD-10-CM | POA: Diagnosis not present

## 2019-12-29 NOTE — Telephone Encounter (Signed)
Work note completed and sent thru [employer].

## 2019-12-31 DIAGNOSIS — R519 Headache, unspecified: Secondary | ICD-10-CM | POA: Insufficient documentation
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
## 2019-12-31 NOTE — Progress Notes (Signed)
Virtual Visit via Video Note  I connected with [name] on 12/29/19 at 10:00 AM EDT by a video enabled telemedicine application and verified that I am speaking with the correct person using two identifiers.  Location: Patient: work, patient and provider are in visit Provider: home   I discussed the limitations of evaluation and management by telemedicine and the availability of in person appointments. The patient expressed understanding and agreed to proceed. Kem Boroughs, CMA was able to get the patient set upon a video visit    Subjective:    Patient ID: [name], female    DOB: 06-19-1965, 54 y.o.   MRN: [id number]  Chief Complaint  Patient presents with  . Follow-up    HPI Patient is in today for follow up on chronic medical concerns. No recent febrile illness or hospitalizations. She is still very anxious and struggling with the stress at work. The neuropsych testing she just completely confirmed the likelihood that she is still struggling with post concussive symptoms after her 2017 head trauma. No recent fall or injury. She is also following with Dr Wallis Mart of GI and they note an overgrowth in her colon and are going to start treatment soon. She is considering going on disability due to her worsening health concerns and the stress at work. Denies CP/palp/SOB/HA/congestion/fevers/GI or GU c/o. Taking meds as prescribed  Past Medical History:  Diagnosis Date  . Allergic rhinitis   . Anemia   . Anxiety   . Asthma   . Contact dermatitis and eczema   . Diverticulitis    CT Scan  . Esophageal reflux   . H. pylori infection   . Hyperlipidemia, mixed   . IBS (irritable bowel syndrome)   . IC (interstitial cystitis)   . Internal hemorrhoids   . Lumbago   . Mitral valve disorders(424.0)   . Osteoarthritis 07/08/2015  . Post concussive syndrome   . Seasonal and perennial allergic rhinitis 01/20/2015    Past Surgical History:  Procedure Laterality Date  .  BARTHOLIN CYST MARSUPIALIZATION N/A 12/18/2013   Procedure: BARTHOLIN CYST MARSUPIALIZATION WITH BIOPSY;  Surgeon: Lovenia Kim, MD;  Location: [facility];  Service: Gynecology;  Laterality: N/A;  . CESAREAN SECTION    . DILITATION & CURRETTAGE/HYSTROSCOPY WITH NOVASURE ABLATION N/A 10/14/2012   Procedure: DILATATION & CURETTAGE/HYSTEROSCOPY WITH NOVASURE ABLATION;  Surgeon: Lovenia Kim, MD;  Location: [facility];  Service: Gynecology;  Laterality: N/A;  . MOUTH SURGERY    . SINUS SURGERY WITH INSTATRAK      Family History  Problem Relation Age of Onset  . Colon polyps Sister   . Mental illness Sister        anxiety from 9/11 survivors  . Varicose Veins Sister   . Osteoporosis Sister   . Diabetes Mother   . Hypertension Mother   . Heart attack Mother        MI at age 2  . Alcohol abuse Mother        smoker  . Heart disease Mother   . Dementia Mother   . Hypertension Father   . Hyperlipidemia Father   . Heart attack Father   . Cancer Father        lung cancer, smoker  . Cancer Brother        acute leukemia  . Dementia Maternal Grandmother   . Heart disease Maternal Grandfather        mi  . Cancer Paternal Grandmother  GYN cancer  . Other Sister        hypoglycemia  . Varicose Veins Sister   . Rheum arthritis Sister   . Pleurisy Sister   . Fibromyalgia Sister   . Mental retardation Sister        depression  . GER disease Son   . Alcohol abuse Other        Family history  . Colon cancer Neg Hx   . Breast cancer Neg Hx     Social History   Socioeconomic History  . Marital status: Married    Spouse name: Not on file  . Number of children: 2  . Years of education: Masters  . Highest education level: Not on file  Occupational History  . Occupation: [employer]  Tobacco Use  . Smoking status: Never Smoker  . Smokeless tobacco: Never Used  Vaping Use  . Vaping Use: Never used  Substance and Sexual Activity  . Alcohol use: Not  Currently  . Drug use: No  . 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 Not on file  Social History Narrative   Lives at home home with husband and son.   Right-handed.   1 cup caffeine daily.   Social Determinants of Health   Financial Resource Strain:   . Difficulty of Paying Living Expenses: Not on file  Food Insecurity:   . Worried About Charity fundraiser in the Last Year: Not on file  . Ran Out of Food in the Last Year: Not on file  Transportation Needs:   . Lack of Transportation (Medical): Not on file  . Lack of Transportation (Non-Medical): Not on file  Physical Activity:   . Days of Exercise per Week: Not on file  . Minutes of Exercise per Session: Not on file  Stress:   . Feeling of Stress : Not on file  Social Connections:   . Frequency of Communication with Friends and Family: Not on file  . Frequency of Social Gatherings with Friends and Family: Not on file  . Attends Religious Services: Not on file  . Active Member of Clubs or Organizations: Not on file  . Attends Archivist Meetings: Not on file  . Marital Status: Not on file  Intimate Partner Violence:   . Fear of Current or Ex-Partner: Not on file  . Emotionally Abused: Not on file  . Physically Abused: Not on file  . Sexually Abused: Not on file    Outpatient Medications Prior to Visit  Medication Sig Dispense Refill  . hydrOXYzine (ATARAX/VISTARIL) 10 MG tablet Take 1-2 tablets (10-20 mg total) by mouth every 8 (eight) hours as needed for anxiety. 60 tablet 2  . Multiple Vitamins-Minerals (MULTIVITAMIN ADULT PO) Take 1 tablet by mouth daily.    . Omega-3 Fatty Acids (FISH OIL) 1000 MG CAPS Take by mouth.    Marland Kitchen OVER THE COUNTER MEDICATION Calcium    . Probiotic Product (PROBIOTIC DAILY PO) Take 1 tablet by mouth daily.    . sertraline (ZOLOFT) 25 MG tablet Take 0.5-1 tablets (12.5-25 mg total) by  mouth daily. 30 tablet 3  . sodium chloride (OCEAN) 0.65 % SOLN nasal spray Place 1 spray into both nostrils as needed for congestion.     No facility-administered medications prior to visit.    Allergies  Allergen Reactions  . Amoxicillin Shortness Of Breath  Doesn't remember rxn  . Aspirin Shortness Of Breath    "irritated stomach" Ibuprofen and Naproxen do not agree with her either  . Codeine Hives and Shortness Of Breath    Does not take percocet or hydrocodone; tylenol only  . Latex Shortness Of Breath and Itching  . Other Shortness Of Breath    peanuts  . Sulfa Antibiotics Hives and Shortness Of Breath  . Sulfamethoxazole-Trimethoprim Shortness Of Breath  . Sulfonamide Derivatives Shortness Of Breath and Itching  . Sulphadimidine Sodium [Sulfamethazine Sodium] Shortness Of Breath    Added to food for preservative  . Ciprofloxacin Other (See Comments)    REACTION: SOB, "Tightness in head" REACTION: SOB, "Tightness in head" Syncope   . Azithromycin Other (See Comments)    Faintness  . Carisoprodol   . Clarithromycin     "Doesn't agree with me"  . Epinephrine     Heart racing  . Fenofibrate Other (See Comments)    Leg cramps, body aches   . Metronidazole Other (See Comments)    "cannot tolerate"  . Nsaids   . Penicillins     Doesn't remember reaction  . Tolmetin   . Omeprazole Palpitations    Review of Systems  Constitutional: Positive for malaise/fatigue. Negative for fever.  HENT: Negative for congestion.   Eyes: Negative for blurred vision.  Respiratory: Negative for shortness of breath.   Cardiovascular: Negative for chest pain, palpitations and leg swelling.  Gastrointestinal: Negative for abdominal pain, blood in stool and nausea.  Genitourinary: Negative for dysuria and frequency.  Musculoskeletal: Negative for falls.  Skin: Negative for rash.  Neurological: Negative for dizziness, loss of consciousness and headaches.  Endo/Heme/Allergies: Negative  for environmental allergies.  Psychiatric/Behavioral: Positive for depression and memory loss. Negative for suicidal ideas. The patient is nervous/anxious.        Objective:    Physical Exam Constitutional:      Appearance: Normal appearance. She is not diaphoretic.  HENT:     Head: Normocephalic and atraumatic.     Right Ear: External ear normal.     Left Ear: External ear normal.     Nose: Nose normal.  Eyes:     General:        Right eye: No discharge.        Left eye: No discharge.  Pulmonary:     Effort: Pulmonary effort is normal.  Neurological:     Mental Status: She is alert and oriented to person, place, and time.  Psychiatric:        Behavior: Behavior normal.     There were no vitals taken for this visit. Wt Readings from Last 3 Encounters:  09/12/19 103 lb 3.2 oz (46.8 kg)  08/11/19 106 lb 12.8 oz (48.4 kg)  07/10/19 107 lb (48.5 kg)    Diabetic Foot Exam - Simple   No data filed     Lab Results  Component Value Date   WBC 5.4 08/14/2019   HGB 13.8 08/14/2019   HCT 40.2 08/14/2019   PLT 295.0 08/14/2019   GLUCOSE 72 08/14/2019   CHOL 199 08/14/2019   TRIG 82.0 08/14/2019   HDL 58.10 08/14/2019   LDLDIRECT 143.0 01/17/2019   LDLCALC 125 (H) 08/14/2019   ALT 13 08/14/2019   AST 15 08/14/2019   NA 140 08/14/2019   K 4.0 08/14/2019   CL 103 08/14/2019   CREATININE 0.62 08/14/2019   BUN 17 08/14/2019   CO2 29 08/14/2019   TSH 2.27 08/14/2019  HGBA1C 5.0 08/14/2019    Lab Results  Component Value Date   TSH 2.27 08/14/2019   Lab Results  Component Value Date   WBC 5.4 08/14/2019   HGB 13.8 08/14/2019   HCT 40.2 08/14/2019   MCV 98.4 08/14/2019   PLT 295.0 08/14/2019   Lab Results  Component Value Date   NA 140 08/14/2019   K 4.0 08/14/2019   CO2 29 08/14/2019   GLUCOSE 72 08/14/2019   BUN 17 08/14/2019   CREATININE 0.62 08/14/2019   BILITOT 1.0 08/14/2019   ALKPHOS 86 08/14/2019   AST 15 08/14/2019   ALT 13 08/14/2019    PROT 7.0 08/14/2019   ALBUMIN 4.5 08/14/2019   CALCIUM 9.6 08/14/2019   ANIONGAP 10 01/09/2019   GFR 100.19 08/14/2019   Lab Results  Component Value Date   CHOL 199 08/14/2019   Lab Results  Component Value Date   HDL 58.10 08/14/2019   Lab Results  Component Value Date   LDLCALC 125 (H) 08/14/2019   Lab Results  Component Value Date   TRIG 82.0 08/14/2019   Lab Results  Component Value Date   CHOLHDL 3 08/14/2019   Lab Results  Component Value Date   HGBA1C 5.0 08/14/2019       Assessment & Plan:   Problem List Items Addressed This Visit    Post concussive syndrome - Primary    She experienced her concussion back in 2017 and after recent neuropsychiatric testing she is noted to likely still be struggling with post concussive syndrome. She may not be able to continue to keep working in a high stress environment if she hopes to get any better. She will let her know her final decision and we will support her move to go on disability if need be. For now minimize stress, get adequate sleep, eat a heart healthy and low inflammation diet. MR of brain ordered today to assess changes since last image.       Relevant Orders   MR Brain Wo Contrast   History of Helicobacter pylori infection    She is following with Dr Wallis Mart of gastroenterology and after endoscopy they discovered colonic bacterial overgrowth and she requires a 2 week course of treatment which will make it difficult for her to work sos she will take some short term leave. Letter will be supplied      Anxiety and depression    She continues to try and control her stress and anxiety but she has daily trouble with it, spent 40 minutes in visit discussing strategies and further plans to manage her symptoms to improve her health and quality of life. No change in meds but consider disability due to her brain injury and symptoms      Nonintractable headache    Still has frequent headaches. Encouraged increased  hydration, 64 ounces of clear fluids daily. Minimize alcohol and caffeine. Eat small frequent meals with lean proteins and complex carbs. Avoid high and low blood sugars. Get adequate sleep, 7-8 hours a night. Needs exercise daily preferably in the morning.      Relevant Orders   MR Brain Wo Contrast      I am having Takyia Ringer maintain her Probiotic Product (PROBIOTIC DAILY PO), OVER THE COUNTER MEDICATION, Multiple Vitamins-Minerals (MULTIVITAMIN ADULT PO), Fish Oil, sodium chloride, sertraline, and hydrOXYzine.  No orders of the defined types were placed in this encounter.    I discussed the assessment and treatment plan with the patient. The patient was  provided an opportunity to ask questions and all were answered. The patient agreed with the plan and demonstrated an understanding of the instructions.   The patient was advised to call back or seek an in-person evaluation if the symptoms worsen or if the condition fails to improve as anticipated.  I provided 40 minutes of non-face-to-face time during this encounter.   Penni Homans, MD

## 2019-12-31 NOTE — Assessment & Plan Note (Signed)
She continues to try and control her stress and anxiety but she has daily trouble with it, spent 40 minutes in visit discussing strategies and further plans to manage her symptoms to improve her health and quality of life. No change in meds but consider disability due to her brain injury and symptoms

## 2019-12-31 NOTE — Assessment & Plan Note (Signed)
She is following with Dr Wallis Mart of gastroenterology and after endoscopy they discovered colonic bacterial overgrowth and she requires a 2 week course of treatment which will make it difficult for her to work sos she will take some short term leave. Letter will be supplied

## 2019-12-31 NOTE — Assessment & Plan Note (Signed)
Still has frequent headaches. Encouraged increased hydration, 64 ounces of clear fluids daily. Minimize alcohol and caffeine. Eat small frequent meals with lean proteins and complex carbs. Avoid high and low blood sugars. Get adequate sleep, 7-8 hours a night. Needs exercise daily preferably in the morning.

## 2020-01-02 ENCOUNTER — Telehealth: Payer: Self-pay | Admitting: Family Medicine

## 2020-01-02 NOTE — Telephone Encounter (Signed)
She wants to make sure that you include in the letter that she need to limit her driving due to treatment there for is more suitable for her to work from home.

## 2020-01-02 NOTE — Telephone Encounter (Signed)
Patient stats that she needs another letter written stating she needs to work from home while taking this new med.  She will pick up meds on Friday she would like for dates to reflect 09/10-09/24 Please advise

## 2020-01-02 NOTE — Telephone Encounter (Signed)
Patient notified that letter has been sent.

## 2020-01-02 NOTE — Telephone Encounter (Signed)
Left message on machine that new letter has been sent.

## 2020-01-04 ENCOUNTER — Telehealth: Payer: Self-pay | Admitting: Family Medicine

## 2020-01-04 NOTE — Telephone Encounter (Signed)
Caller: cary ([employer] Call back # [phone]  Checking on the status of papework

## 2020-01-04 NOTE — Telephone Encounter (Signed)
Called patient and paperwork will be discussed on tomorrow via video call MyChart.

## 2020-01-05 ENCOUNTER — Other Ambulatory Visit: Payer: Self-pay

## 2020-01-05 ENCOUNTER — Telehealth (INDEPENDENT_AMBULATORY_CARE_PROVIDER_SITE_OTHER): Payer: 59 | Admitting: Family Medicine

## 2020-01-05 DIAGNOSIS — F0781 Postconcussional syndrome: Secondary | ICD-10-CM | POA: Diagnosis not present

## 2020-01-05 DIAGNOSIS — F329 Major depressive disorder, single episode, unspecified: Secondary | ICD-10-CM | POA: Diagnosis not present

## 2020-01-05 DIAGNOSIS — F419 Anxiety disorder, unspecified: Secondary | ICD-10-CM | POA: Diagnosis not present

## 2020-01-05 DIAGNOSIS — G4700 Insomnia, unspecified: Secondary | ICD-10-CM | POA: Diagnosis not present

## 2020-01-05 DIAGNOSIS — F32A Depression, unspecified: Secondary | ICD-10-CM

## 2020-01-06 ENCOUNTER — Other Ambulatory Visit: Payer: Self-pay

## 2020-01-06 ENCOUNTER — Ambulatory Visit (HOSPITAL_BASED_OUTPATIENT_CLINIC_OR_DEPARTMENT_OTHER)
Admission: RE | Admit: 2020-01-06 | Discharge: 2020-01-06 | Disposition: A | Discharge status: Home / Self Care | Payer: 59 | Source: Ambulatory Visit | Attending: Family Medicine | Admitting: Family Medicine

## 2020-01-06 DIAGNOSIS — R519 Headache, unspecified: Secondary | ICD-10-CM | POA: Insufficient documentation

## 2020-01-06 DIAGNOSIS — F0781 Postconcussional syndrome: Secondary | ICD-10-CM

## 2020-01-08 ENCOUNTER — Telehealth: Payer: Self-pay

## 2020-01-08 NOTE — Telephone Encounter (Signed)
Patient would like to know if there is any medication that can be taken for prevention of strokes, cholesterol, etc aiding with her dx of possible brain injury.  Basically, one pill can she take for everything.

## 2020-01-08 NOTE — Assessment & Plan Note (Signed)
Continues in counseling with Dennison Bulla and on Sertraline 25 mg. These are helping some but her anxiety still debilitates her. She is unable to manage high stress environments and has rapid thoughts, feels tremulousness, tearfulness, difficulty concentrating.

## 2020-01-08 NOTE — Telephone Encounter (Signed)
There is not just one medicine. She should be taking ECASA 81 mg daily but otherwise no medicine that can prevent stroke for her. Her cholesterol is OK so no med to add. Does she want the referral to neurology?

## 2020-01-08 NOTE — Progress Notes (Signed)
Virtual Visit via Video Note  I connected with [name] on 01/05/20 at 12:20 PM EDT by a video enabled telemedicine application and verified that I am speaking with the correct person using two identifiers.  Location: Patient: work, patient and provider in visit Provider: home   I discussed the limitations of evaluation and management by telemedicine and the availability of in person appointments. The patient expressed understanding and agreed to proceed. Kem Boroughs, CMA was able to set up a video visit      Subjective:    Patient ID: [name], female    DOB: 1966/02/22, 54 y.o.   MRN: [id number]  Chief Complaint  Patient presents with  . [place] Paperwork-following    HPI Patient is in today for follow up on chronic medical concerns and to complete some insurance paperwork. She continues to struggle with significant anxiety with tremors, palpitations, racing thoughts difficulty concentrating and more. Work is a Primary school teacher. Recent MRI confirms likelihood of persistent symptoms form Post Concussive Syndrome. No recent febrile illness or hospitalizations. Denies CP/palp/SOB/HA/congestion/fevers/GI or GU c/o. Taking meds as prescribed  Past Medical History:  Diagnosis Date  . Allergic rhinitis   . Anemia   . Anxiety   . Asthma   . Contact dermatitis and eczema   . Diverticulitis    CT Scan  . Esophageal reflux   . H. pylori infection   . Hyperlipidemia, mixed   . IBS (irritable bowel syndrome)   . IC (interstitial cystitis)   . Internal hemorrhoids   . Lumbago   . Mitral valve disorders(424.0)   . Osteoarthritis 07/08/2015  . Post concussive syndrome   . Seasonal and perennial allergic rhinitis 01/20/2015    Past Surgical History:  Procedure Laterality Date  . BARTHOLIN CYST MARSUPIALIZATION N/A 12/18/2013   Procedure: BARTHOLIN CYST MARSUPIALIZATION WITH BIOPSY;  Surgeon: Lovenia Kim, MD;  Location: [facility];  Service: Gynecology;   Laterality: N/A;  . CESAREAN SECTION    . DILITATION & CURRETTAGE/HYSTROSCOPY WITH NOVASURE ABLATION N/A 10/14/2012   Procedure: DILATATION & CURETTAGE/HYSTEROSCOPY WITH NOVASURE ABLATION;  Surgeon: Lovenia Kim, MD;  Location: [facility];  Service: Gynecology;  Laterality: N/A;  . MOUTH SURGERY    . SINUS SURGERY WITH INSTATRAK      Family History  Problem Relation Age of Onset  . Colon polyps Sister   . Mental illness Sister        anxiety from 9/11 survivors  . Varicose Veins Sister   . Osteoporosis Sister   . Diabetes Mother   . Hypertension Mother   . Heart attack Mother        MI at age 7  . Alcohol abuse Mother        smoker  . Heart disease Mother   . Dementia Mother   . Hypertension Father   . Hyperlipidemia Father   . Heart attack Father   . Cancer Father        lung cancer, smoker  . Cancer Brother        acute leukemia  . Dementia Maternal Grandmother   . Heart disease Maternal Grandfather        mi  . Cancer Paternal Grandmother        GYN cancer  . Other Sister        hypoglycemia  . Varicose Veins Sister   . Rheum arthritis Sister   . Pleurisy Sister   . Fibromyalgia Sister   . Mental retardation Sister  depression  . GER disease Son   . Alcohol abuse Other        Family history  . Colon cancer Neg Hx   . Breast cancer Neg Hx     Social History   Socioeconomic History  . Marital status: Married    Spouse name: Not on file  . Number of children: 2  . Years of education: Masters  . Highest education level: Not on file  Occupational History  . Occupation: [employer]  Tobacco Use  . Smoking status: Never Smoker  . Smokeless tobacco: Never Used  Vaping Use  . Vaping Use: Never used  Substance and Sexual Activity  . Alcohol use: Not Currently  . Drug use: No  . 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 Not on file    Social History Narrative   Lives at home home with husband and son.   Right-handed.   1 cup caffeine daily.   Social Determinants of Health   Financial Resource Strain:   . Difficulty of Paying Living Expenses: Not on file  Food Insecurity:   . Worried About Charity fundraiser in the Last Year: Not on file  . Ran Out of Food in the Last Year: Not on file  Transportation Needs:   . Lack of Transportation (Medical): Not on file  . Lack of Transportation (Non-Medical): Not on file  Physical Activity:   . Days of Exercise per Week: Not on file  . Minutes of Exercise per Session: Not on file  Stress:   . Feeling of Stress : Not on file  Social Connections:   . Frequency of Communication with Friends and Family: Not on file  . Frequency of Social Gatherings with Friends and Family: Not on file  . Attends Religious Services: Not on file  . Active Member of Clubs or Organizations: Not on file  . Attends Archivist Meetings: Not on file  . Marital Status: Not on file  Intimate Partner Violence:   . Fear of Current or Ex-Partner: Not on file  . Emotionally Abused: Not on file  . Physically Abused: Not on file  . Sexually Abused: Not on file    Outpatient Medications Prior to Visit  Medication Sig Dispense Refill  . hydrOXYzine (ATARAX/VISTARIL) 10 MG tablet Take 1-2 tablets (10-20 mg total) by mouth every 8 (eight) hours as needed for anxiety. 60 tablet 2  . Multiple Vitamins-Minerals (MULTIVITAMIN ADULT PO) Take 1 tablet by mouth daily.    . Omega-3 Fatty Acids (FISH OIL) 1000 MG CAPS Take by mouth.    Marland Kitchen OVER THE COUNTER MEDICATION Calcium    . Probiotic Product (PROBIOTIC DAILY PO) Take 1 tablet by mouth daily.    . sertraline (ZOLOFT) 25 MG tablet Take 0.5-1 tablets (12.5-25 mg total) by mouth daily. 30 tablet 3  . sodium chloride (OCEAN) 0.65 % SOLN nasal spray Place 1 spray into both nostrils as needed for congestion.     No facility-administered medications prior  to visit.    Allergies  Allergen Reactions  . Amoxicillin Shortness Of Breath    Doesn't remember rxn  . Aspirin Shortness Of Breath    "irritated stomach" Ibuprofen and Naproxen do not agree with her either  . Codeine Hives and Shortness Of Breath    Does not take percocet or hydrocodone; tylenol only  .  Latex Shortness Of Breath and Itching  . Other Shortness Of Breath    peanuts  . Sulfa Antibiotics Hives and Shortness Of Breath  . Sulfamethoxazole-Trimethoprim Shortness Of Breath  . Sulfonamide Derivatives Shortness Of Breath and Itching  . Sulphadimidine Sodium [Sulfamethazine Sodium] Shortness Of Breath    Added to food for preservative  . Ciprofloxacin Other (See Comments)    REACTION: SOB, "Tightness in head" REACTION: SOB, "Tightness in head" Syncope   . Azithromycin Other (See Comments)    Faintness  . Carisoprodol   . Clarithromycin     "Doesn't agree with me"  . Epinephrine     Heart racing  . Fenofibrate Other (See Comments)    Leg cramps, body aches   . Metronidazole Other (See Comments)    "cannot tolerate"  . Nsaids   . Penicillins     Doesn't remember reaction  . Tolmetin   . Omeprazole Palpitations    Review of Systems  Constitutional: Positive for malaise/fatigue. Negative for fever.  HENT: Negative for congestion.   Eyes: Negative for blurred vision.  Respiratory: Negative for shortness of breath.   Cardiovascular: Positive for palpitations. Negative for chest pain and leg swelling.  Gastrointestinal: Negative for abdominal pain, blood in stool and nausea.  Genitourinary: Negative for dysuria and frequency.  Musculoskeletal: Negative for falls.  Skin: Negative for rash.  Neurological: Positive for tremors. Negative for dizziness, loss of consciousness and headaches.  Endo/Heme/Allergies: Negative for environmental allergies.  Psychiatric/Behavioral: Positive for depression. Negative for suicidal ideas. The patient is nervous/anxious and has  insomnia.        Objective:    Physical Exam Constitutional:      Appearance: Normal appearance. She is not ill-appearing.  HENT:     Head: Normocephalic and atraumatic.     Right Ear: External ear normal.     Left Ear: External ear normal.     Nose: Nose normal.  Eyes:     General:        Right eye: No discharge.        Left eye: No discharge.  Pulmonary:     Effort: Pulmonary effort is normal.  Neurological:     Mental Status: She is alert and oriented to person, place, and time.  Psychiatric:        Behavior: Behavior normal.     There were no vitals taken for this visit. Wt Readings from Last 3 Encounters:  09/12/19 103 lb 3.2 oz (46.8 kg)  08/11/19 106 lb 12.8 oz (48.4 kg)  07/10/19 107 lb (48.5 kg)    Diabetic Foot Exam - Simple   No data filed     Lab Results  Component Value Date   WBC 5.4 08/14/2019   HGB 13.8 08/14/2019   HCT 40.2 08/14/2019   PLT 295.0 08/14/2019   GLUCOSE 72 08/14/2019   CHOL 199 08/14/2019   TRIG 82.0 08/14/2019   HDL 58.10 08/14/2019   LDLDIRECT 143.0 01/17/2019   LDLCALC 125 (H) 08/14/2019   ALT 13 08/14/2019   AST 15 08/14/2019   NA 140 08/14/2019   K 4.0 08/14/2019   CL 103 08/14/2019   CREATININE 0.62 08/14/2019   BUN 17 08/14/2019   CO2 29 08/14/2019   TSH 2.27 08/14/2019   HGBA1C 5.0 08/14/2019    Lab Results  Component Value Date   TSH 2.27 08/14/2019   Lab Results  Component Value Date   WBC 5.4 08/14/2019   HGB 13.8 08/14/2019   HCT 40.2  08/14/2019   MCV 98.4 08/14/2019   PLT 295.0 08/14/2019   Lab Results  Component Value Date   NA 140 08/14/2019   K 4.0 08/14/2019   CO2 29 08/14/2019   GLUCOSE 72 08/14/2019   BUN 17 08/14/2019   CREATININE 0.62 08/14/2019   BILITOT 1.0 08/14/2019   ALKPHOS 86 08/14/2019   AST 15 08/14/2019   ALT 13 08/14/2019   PROT 7.0 08/14/2019   ALBUMIN 4.5 08/14/2019   CALCIUM 9.6 08/14/2019   ANIONGAP 10 01/09/2019   GFR 100.19 08/14/2019   Lab Results    Component Value Date   CHOL 199 08/14/2019   Lab Results  Component Value Date   HDL 58.10 08/14/2019   Lab Results  Component Value Date   LDLCALC 125 (H) 08/14/2019   Lab Results  Component Value Date   TRIG 82.0 08/14/2019   Lab Results  Component Value Date   CHOLHDL 3 08/14/2019   Lab Results  Component Value Date   HGBA1C 5.0 08/14/2019       Assessment & Plan:   Problem List Items Addressed This Visit    Post concussive syndrome    Recent imaging confirms persistent findings c/w post concussive syndrome. She has seen neurology at [facility] before and she agrees to return there for further evaluation. Her persistent injury makes it difficult for her to manage in a fast paced, high stress environments. She will not be able to maintain employment as the this stress is making it difficult for her to progress.       Anxiety and depression    Continues in counseling with Dennison Bulla and on Sertraline 25 mg. These are helping some but her anxiety still debilitates her. She is unable to manage high stress environments and has rapid thoughts, feels tremulousness, tearfulness, difficulty concentrating.       Insomnia    Encouraged good sleep hygiene such as dark, quiet room. No blue/green glowing lights such as computer screens in bedroom. No alcohol or stimulants in evening. Cut down on caffeine as able. Regular exercise is helpful but not just prior to bed time.          I am having [name] maintain her Probiotic Product (PROBIOTIC DAILY PO), OVER THE COUNTER MEDICATION, Multiple Vitamins-Minerals (MULTIVITAMIN ADULT PO), Fish Oil, sodium chloride, sertraline, and hydrOXYzine.  No orders of the defined types were placed in this encounter. I discussed the assessment and treatment plan with the patient. The patient was provided an opportunity to ask questions and all were answered. The patient agreed with the plan and demonstrated an understanding of the  instructions.   The patient was advised to call back or seek an in-person evaluation if the symptoms worsen or if the condition fails to improve as anticipated.  I provided 20 minutes of non-face-to-face time during this encounter.   Penni Homans, MD

## 2020-01-08 NOTE — Telephone Encounter (Signed)
Patient has been notified and will take aspirin 81 mg. Daily, as advised per provider.

## 2020-01-08 NOTE — Assessment & Plan Note (Signed)
Encouraged good sleep hygiene such as dark, quiet room. No blue/green glowing lights such as computer screens in bedroom. No alcohol or stimulants in evening. Cut down on caffeine as able. Regular exercise is helpful but not just prior to bed time.  

## 2020-01-08 NOTE — Assessment & Plan Note (Addendum)
Recent imaging confirms persistent findings c/w post concussive syndrome. She has seen neurology at [facility] before and she agrees to return there for further evaluation. Her persistent injury makes it difficult for her to manage in a fast paced, high stress environments. She will not be able to maintain employment as the this stress is making it difficult for her to progress.

## 2020-01-08 NOTE — Telephone Encounter (Signed)
-----   Message from Mosie Lukes, MD sent at 01/07/2020  8:15 PM EDT ----- Stable changes consistent with traumatic brain injury but other conditions as well. If she is not currently seeing neurology she should let us refer her back to them for consideration.

## 2020-01-11 ENCOUNTER — Telehealth: Payer: Self-pay | Admitting: Family Medicine

## 2020-01-11 NOTE — Telephone Encounter (Signed)
Caller : [name]  Call Back # [phone]   Patient is requesting Dr Charlett Blake to write a letter (for employer) in order for her to continue to work from home permanently due to her  post concussion syndrome.    Please Advise

## 2020-01-12 NOTE — Telephone Encounter (Signed)
Dr. Charlett Blake would you be okay writing letter to employer for this?

## 2020-01-12 NOTE — Telephone Encounter (Signed)
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

## 2020-01-16 ENCOUNTER — Telehealth: Payer: Self-pay | Admitting: Counselor

## 2020-01-16 DIAGNOSIS — F0781 Postconcussional syndrome: Secondary | ICD-10-CM

## 2020-01-16 NOTE — Telephone Encounter (Signed)
Patient called in stating her PCP forwarded the patient's MRI results to [place]. She would like your opinion on where to go from here?

## 2020-01-16 NOTE — Telephone Encounter (Signed)
I spoke with Ms. Reddin by phone regarding her MRI. To my eye, there do not appear to be any acute or chronic changes that are concerning. The MRI was ordered primarily to exclude concerning etiologies, such as strokes, significant progression of vascular disease, or neurodegeneration and thus, this is reassuring. There were some minimal areas of white matter change, which can be associated with a number of different etiologies as noted by radiology, but are typically nonspecific and are likely incidental in my opinion. I have no further recommendations for her and encouraged her to continue to follow with Dr. Charlett Blake. We reviewed recommendation for sleep hygiene practices and regular counseling and I directed her back to the patient instructions from her follow up visit. I think that the most likely contributors to her problems continue to be her significant anxiety and depression and recommended that she continue to work with Dennison Bulla on those issues.   Services associated with this encounter: 16 minutes (40981; Health and behavior intervention)

## 2020-01-16 NOTE — Telephone Encounter (Signed)
Patient has a scheduled virtual appointment on 9/29 with provider and will discuss options regarding Dx for starting Disability.

## 2020-01-22 ENCOUNTER — Ambulatory Visit (INDEPENDENT_AMBULATORY_CARE_PROVIDER_SITE_OTHER): Payer: 59 | Admitting: Psychology

## 2020-01-22 ENCOUNTER — Telehealth: Payer: Self-pay | Admitting: *Deleted

## 2020-01-22 DIAGNOSIS — F4322 Adjustment disorder with anxiety: Secondary | ICD-10-CM

## 2020-01-22 NOTE — Telephone Encounter (Signed)
Patient States 9:20 will work on Wednesday.

## 2020-01-22 NOTE — Telephone Encounter (Signed)
Left message on machine to call back  Need to discuss appt time for tomorrow.  Was it 10 or 920.

## 2020-01-22 NOTE — Telephone Encounter (Signed)
Appt changed

## 2020-01-24 ENCOUNTER — Telehealth (INDEPENDENT_AMBULATORY_CARE_PROVIDER_SITE_OTHER): Payer: 59 | Admitting: Family Medicine

## 2020-01-24 ENCOUNTER — Other Ambulatory Visit: Payer: Self-pay

## 2020-01-24 DIAGNOSIS — R519 Headache, unspecified: Secondary | ICD-10-CM

## 2020-01-24 DIAGNOSIS — J4520 Mild intermittent asthma, uncomplicated: Secondary | ICD-10-CM | POA: Diagnosis not present

## 2020-01-24 DIAGNOSIS — F419 Anxiety disorder, unspecified: Secondary | ICD-10-CM | POA: Diagnosis not present

## 2020-01-24 DIAGNOSIS — F0781 Postconcussional syndrome: Secondary | ICD-10-CM | POA: Diagnosis not present

## 2020-01-24 DIAGNOSIS — F32A Depression, unspecified: Secondary | ICD-10-CM

## 2020-01-24 DIAGNOSIS — F329 Major depressive disorder, single episode, unspecified: Secondary | ICD-10-CM

## 2020-01-24 MED ORDER — MONTELUKAST SODIUM 5 MG PO CHEW: 5.0000 mg | CHEWABLE_TABLET | Freq: Every evening | ORAL | 2 refills | Status: DC | PRN

## 2020-01-24 NOTE — Assessment & Plan Note (Signed)
Encouraged increased hydration, 64 ounces of clear fluids daily. Minimize alcohol and caffeine. Eat small frequent meals with lean proteins and complex carbs. Avoid high and low blood sugars. Get adequate sleep, 7-8 hours a night. Needs exercise daily preferably in the morning. Avoid harsh lighting which serves as a trigger for this patient

## 2020-01-24 NOTE — Assessment & Plan Note (Addendum)
Work continues to struggle with high anxiety and anhedonia at work. She has considered disability due to the combination anxiety, depression, post concussive syndrome and headaches but is going to try and continue working if she can find a balance between her work and her health. She would benefit from working more from home. She will be supplied with a note to try and increase at home work days from 2 days a week to 3 days a week.

## 2020-01-24 NOTE — Assessment & Plan Note (Signed)
Has been seen by neurology per patient there is no need for further work with them in this regard

## 2020-01-24 NOTE — Progress Notes (Signed)
Virtual Visit via Video Note  I connected with [name] on 01/24/20 at  9:20 AM EDT by a video enabled telemedicine application and verified that I am speaking with the correct person using two identifiers.  Location: Patient: home, patient and provider are in the visit Provider: home   I discussed the limitations of evaluation and management by telemedicine and the availability of in person appointments. The patient expressed understanding and agreed to proceed. [name], CMA was able to get the patient set up on a virtual visit.     Subjective:    Patient ID: [name], female    DOB: 03-14-1966, 54 y.o.   MRN: [id number]  Chief Complaint  Patient presents with  . Follow-up    HPI Patient is in today for follow up on chronic medical concerns. No recent febrile illness. Her sinuses are some better since taking antibiotic for her GI tract but she still has some pruritus and congestion in nasal passages and some dry cough. Her anxiety is still flared with work but she is managing. Headaches are intermittent and are worse when working at this office. Denies CP/palp/SOB/fevers or GU c/o. Taking meds as prescribed  Past Medical History:  Diagnosis Date  . Allergic rhinitis   . Anemia   . Anxiety   . Asthma   . Contact dermatitis and eczema   . Diverticulitis    CT Scan  . Esophageal reflux   . H. pylori infection   . Hyperlipidemia, mixed   . IBS (irritable bowel syndrome)   . IC (interstitial cystitis)   . Internal hemorrhoids   . Lumbago   . Mitral valve disorders(424.0)   . Osteoarthritis 07/08/2015  . Post concussive syndrome   . Seasonal and perennial allergic rhinitis 01/20/2015    Past Surgical History:  Procedure Laterality Date  . BARTHOLIN CYST MARSUPIALIZATION N/A 12/18/2013   Procedure: BARTHOLIN CYST MARSUPIALIZATION WITH BIOPSY;  Surgeon: Mallory Kim, MD;  Location: [facility];  Service: Gynecology;  Laterality: N/A;  . CESAREAN SECTION    .  DILITATION & CURRETTAGE/HYSTROSCOPY WITH NOVASURE ABLATION N/A 10/14/2012   Procedure: DILATATION & CURETTAGE/HYSTEROSCOPY WITH NOVASURE ABLATION;  Surgeon: Mallory Kim, MD;  Location: [facility];  Service: Gynecology;  Laterality: N/A;  . MOUTH SURGERY    . SINUS SURGERY WITH INSTATRAK      Family History  Problem Relation Age of Onset  . Colon polyps Sister   . Mental illness Sister        anxiety from 9/11 survivors  . Varicose Veins Sister   . Osteoporosis Sister   . Diabetes Mother   . Hypertension Mother   . Heart attack Mother        MI at age 35  . Alcohol abuse Mother        smoker  . Heart disease Mother   . Dementia Mother   . Hypertension Father   . Hyperlipidemia Father   . Heart attack Father   . Cancer Father        lung cancer, smoker  . Cancer Brother        acute leukemia  . Dementia Maternal Grandmother   . Heart disease Maternal Grandfather        mi  . Cancer Paternal Grandmother        GYN cancer  . Other Sister        hypoglycemia  . Varicose Veins Sister   . Rheum arthritis Sister   . Pleurisy Sister   .  Fibromyalgia Sister   . Mental retardation Sister        depression  . GER disease Son   . Alcohol abuse Other        Family history  . Colon cancer Neg Hx   . Breast cancer Neg Hx     Social History   Socioeconomic History  . Marital status: Married    Spouse name: Not on file  . Number of children: 2  . Years of education: Masters  . Highest education level: Not on file  Occupational History  . Occupation: [employer]  Tobacco Use  . Smoking status: Never Smoker  . Smokeless tobacco: Never Used  Vaping Use  . Vaping Use: Never used  Substance and Sexual Activity  . Alcohol use: Not Currently  . Drug use: No  . 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 Not on file  Social History Narrative   Lives at home home  with husband and son.   Right-handed.   1 cup caffeine daily.   Social Determinants of Health   Financial Resource Strain:   . Difficulty of Paying Living Expenses: Not on file  Food Insecurity:   . Worried About Charity fundraiser in the Last Year: Not on file  . Ran Out of Food in the Last Year: Not on file  Transportation Needs:   . Lack of Transportation (Medical): Not on file  . Lack of Transportation (Non-Medical): Not on file  Physical Activity:   . Days of Exercise per Week: Not on file  . Minutes of Exercise per Session: Not on file  Stress:   . Feeling of Stress : Not on file  Social Connections:   . Frequency of Communication with Friends and Family: Not on file  . Frequency of Social Gatherings with Friends and Family: Not on file  . Attends Religious Services: Not on file  . Active Member of Clubs or Organizations: Not on file  . Attends Archivist Meetings: Not on file  . Marital Status: Not on file  Intimate Partner Violence:   . Fear of Current or Ex-Partner: Not on file  . Emotionally Abused: Not on file  . Physically Abused: Not on file  . Sexually Abused: Not on file    Outpatient Medications Prior to Visit  Medication Sig Dispense Refill  . hydrOXYzine (ATARAX/VISTARIL) 10 MG tablet Take 1-2 tablets (10-20 mg total) by mouth every 8 (eight) hours as needed for anxiety. 60 tablet 2  . Multiple Vitamins-Minerals (MULTIVITAMIN ADULT PO) Take 1 tablet by mouth daily.    . Omega-3 Fatty Acids (FISH OIL) 1000 MG CAPS Take by mouth.    Marland Kitchen OVER THE COUNTER MEDICATION Calcium    . Probiotic Product (PROBIOTIC DAILY PO) Take 1 tablet by mouth daily.    . sertraline (ZOLOFT) 25 MG tablet Take 0.5-1 tablets (12.5-25 mg total) by mouth daily. 30 tablet 3  . sodium chloride (OCEAN) 0.65 % SOLN nasal spray Place 1 spray into both nostrils as needed for congestion.     No facility-administered medications prior to visit.    Allergies  Allergen Reactions   . Amoxicillin Shortness Of Breath    Doesn't remember rxn  . Aspirin Shortness Of Breath    "irritated stomach" Ibuprofen and Naproxen do not agree with her either  . Codeine Hives and Shortness Of  Breath    Does not take percocet or hydrocodone; tylenol only  . Latex Shortness Of Breath and Itching  . Other Shortness Of Breath    peanuts  . Sulfa Antibiotics Hives and Shortness Of Breath  . Sulfamethoxazole-Trimethoprim Shortness Of Breath  . Sulfonamide Derivatives Shortness Of Breath and Itching  . Sulphadimidine Sodium [Sulfamethazine Sodium] Shortness Of Breath    Added to food for preservative  . Ciprofloxacin Other (See Comments)    REACTION: SOB, "Tightness in head" REACTION: SOB, "Tightness in head" Syncope   . Azithromycin Other (See Comments)    Faintness  . Carisoprodol   . Clarithromycin     "Doesn't agree with me"  . Epinephrine     Heart racing  . Fenofibrate Other (See Comments)    Leg cramps, body aches   . Metronidazole Other (See Comments)    "cannot tolerate"  . Nsaids   . Penicillins     Doesn't remember reaction  . Tolmetin   . Omeprazole Palpitations    Review of Systems  Constitutional: Positive for malaise/fatigue. Negative for fever.  HENT: Positive for congestion.   Eyes: Negative for blurred vision.  Respiratory: Positive for cough. Negative for sputum production and shortness of breath.   Cardiovascular: Negative for chest pain, palpitations and leg swelling.  Gastrointestinal: Negative for abdominal pain, blood in stool and nausea.  Genitourinary: Negative for dysuria and frequency.  Musculoskeletal: Negative for falls.  Skin: Negative for rash.  Neurological: Positive for headaches. Negative for dizziness and loss of consciousness.  Endo/Heme/Allergies: Negative for environmental allergies.  Psychiatric/Behavioral: Positive for depression. The patient is nervous/anxious.        Objective:    Physical Exam Constitutional:       Appearance: Normal appearance. She is not ill-appearing.  HENT:     Head: Normocephalic and atraumatic.     Right Ear: External ear normal.     Left Ear: External ear normal.  Eyes:     General:        Right eye: No discharge.        Left eye: No discharge.  Pulmonary:     Effort: Pulmonary effort is normal.  Neurological:     Mental Status: She is alert and oriented to person, place, and time.  Psychiatric:        Behavior: Behavior normal.     BP 108/61 (BP Location: Left Arm, Patient Position: Sitting, Cuff Size: Normal)   Pulse 84   Temp (!) 97.3 F (36.3 C) (Oral)   Ht 4\' 11"  (1.499 m)   Wt 106 lb (48.1 kg)   SpO2 96%   BMI 21.41 kg/m  Wt Readings from Last 3 Encounters:  01/24/20 106 lb (48.1 kg)  09/12/19 103 lb 3.2 oz (46.8 kg)  08/11/19 106 lb 12.8 oz (48.4 kg)    Diabetic Foot Exam - Simple   No data filed     Lab Results  Component Value Date   WBC 5.4 08/14/2019   HGB 13.8 08/14/2019   HCT 40.2 08/14/2019   PLT 295.0 08/14/2019   GLUCOSE 72 08/14/2019   CHOL 199 08/14/2019   TRIG 82.0 08/14/2019   HDL 58.10 08/14/2019   LDLDIRECT 143.0 01/17/2019   LDLCALC 125 (H) 08/14/2019   ALT 13 08/14/2019   AST 15 08/14/2019   NA 140 08/14/2019   K 4.0 08/14/2019   CL 103 08/14/2019   CREATININE 0.62 08/14/2019   BUN 17 08/14/2019   CO2 29 08/14/2019  TSH 2.27 08/14/2019   HGBA1C 5.0 08/14/2019    Lab Results  Component Value Date   TSH 2.27 08/14/2019   Lab Results  Component Value Date   WBC 5.4 08/14/2019   HGB 13.8 08/14/2019   HCT 40.2 08/14/2019   MCV 98.4 08/14/2019   PLT 295.0 08/14/2019   Lab Results  Component Value Date   NA 140 08/14/2019   K 4.0 08/14/2019   CO2 29 08/14/2019   GLUCOSE 72 08/14/2019   BUN 17 08/14/2019   CREATININE 0.62 08/14/2019   BILITOT 1.0 08/14/2019   ALKPHOS 86 08/14/2019   AST 15 08/14/2019   ALT 13 08/14/2019   PROT 7.0 08/14/2019   ALBUMIN 4.5 08/14/2019   CALCIUM 9.6 08/14/2019    ANIONGAP 10 01/09/2019   GFR 100.19 08/14/2019   Lab Results  Component Value Date   CHOL 199 08/14/2019   Lab Results  Component Value Date   HDL 58.10 08/14/2019   Lab Results  Component Value Date   LDLCALC 125 (H) 08/14/2019   Lab Results  Component Value Date   TRIG 82.0 08/14/2019   Lab Results  Component Value Date   CHOLHDL 3 08/14/2019   Lab Results  Component Value Date   HGBA1C 5.0 08/14/2019       Assessment & Plan:   Problem List Items Addressed This Visit    Allergic asthma, mild intermittent, uncomplicated    Congestion and cough still present at time, she has taken Singulair in the past but it caused some GI upset. We will try 5 mg instead of 10 and she will continue nasal saline and add Xclear. If not enough response will try adding Nasonex. If cough worsens can consider CXR and further treatment      Relevant Medications   montelukast (SINGULAIR) 5 MG chewable tablet   Post concussive syndrome    Has been seen by neurology per patient there is no need for further work with them in this regard      Anxiety and depression    Work continues to struggle with high anxiety and anhedonia at work. She has considered disability due to the combination anxiety, depression, post concussive syndrome and headaches but is going to try and continue working if she can find a balance between her work and her health. She would benefit from working more from home. She will be supplied with a note to try and increase at home work days from 2 days a week to 3 days a week.       Nonintractable headache    Encouraged increased hydration, 64 ounces of clear fluids daily. Minimize alcohol and caffeine. Eat small frequent meals with lean proteins and complex carbs. Avoid high and low blood sugars. Get adequate sleep, 7-8 hours a night. Needs exercise daily preferably in the morning. Avoid harsh lighting which serves as a trigger for this patient         I am having Hansini  Redford start on montelukast. I am also having her maintain her Probiotic Product (PROBIOTIC DAILY PO), OVER THE COUNTER MEDICATION, Multiple Vitamins-Minerals (MULTIVITAMIN ADULT PO), Fish Oil, sodium chloride, sertraline, and hydrOXYzine.  Meds ordered this encounter  Medications  . montelukast (SINGULAIR) 5 MG chewable tablet    Sig: Chew 1 tablet (5 mg total) by mouth at bedtime as needed.    Dispense:  30 tablet    Refill:  2    I discussed the assessment and treatment plan with the patient. The  patient was provided an opportunity to ask questions and all were answered. The patient agreed with the plan and demonstrated an understanding of the instructions.   The patient was advised to call back or seek an in-person evaluation if the symptoms worsen or if the condition fails to improve as anticipated.  I provided 20 minutes of non-face-to-face time during this encounter.   Penni Homans, MD

## 2020-01-24 NOTE — Assessment & Plan Note (Signed)
Congestion and cough still present at time, she has taken Singulair in the past but it caused some GI upset. We will try 5 mg instead of 10 and she will continue nasal saline and add Xclear. If not enough response will try adding Nasonex. If cough worsens can consider CXR and further treatment

## 2020-01-25 ENCOUNTER — Encounter: Payer: Self-pay | Admitting: *Deleted

## 2020-01-26 ENCOUNTER — Telehealth: Payer: Self-pay | Admitting: Family Medicine

## 2020-01-26 NOTE — Telephone Encounter (Signed)
Caller: [name]  Call Back # [phone]   Patient states her insurance company need office notes from April 2021-October 09, 2019  In order to approve her claim for April    [employer]- [facility]  Fax number [phone]

## 2020-01-27 NOTE — Telephone Encounter (Signed)
Form faxed

## 2020-01-27 NOTE — Telephone Encounter (Signed)
Called patient and notified have received paperwork and will pull medical record request  (Office Visit Notes) for the dates of 07/2019 through June 2021.  Once information has been faxed will notify patient.

## 2020-01-30 ENCOUNTER — Telehealth: Payer: Self-pay | Admitting: Family Medicine

## 2020-01-30 NOTE — Telephone Encounter (Signed)
Error

## 2020-01-30 NOTE — Telephone Encounter (Signed)
[place]  Fax Number: [phone]  Per Webb Silversmith, She needs office notes from 07/2019-09/2019 sent to the [facility] @ [phone] to complete patient claim. Per Webb Silversmith, she does not need AVS(after visit summary).

## 2020-01-30 NOTE — Telephone Encounter (Signed)
OV notes faxed  

## 2020-02-07 ENCOUNTER — Ambulatory Visit (INDEPENDENT_AMBULATORY_CARE_PROVIDER_SITE_OTHER): Payer: 59 | Admitting: Psychology

## 2020-02-07 DIAGNOSIS — F4322 Adjustment disorder with anxiety: Secondary | ICD-10-CM

## 2020-02-14 ENCOUNTER — Telehealth: Payer: Self-pay | Admitting: Family Medicine

## 2020-02-14 NOTE — Telephone Encounter (Signed)
See previous note on pt paperwork

## 2020-02-14 NOTE — Telephone Encounter (Signed)
Caller name: Thomasena Edis [facility] Disability) Call back [place] for a form that was fax on 02/02/20 & 02/09/20

## 2020-02-18 ENCOUNTER — Encounter: Payer: Self-pay | Admitting: Family Medicine

## 2020-02-18 NOTE — Telephone Encounter (Signed)
Form completed.

## 2020-02-19 ENCOUNTER — Ambulatory Visit (INDEPENDENT_AMBULATORY_CARE_PROVIDER_SITE_OTHER): Payer: 59 | Admitting: Psychology

## 2020-02-19 DIAGNOSIS — F4322 Adjustment disorder with anxiety: Secondary | ICD-10-CM

## 2020-02-19 NOTE — Telephone Encounter (Signed)
Spoke with pt and was given June 14 paperwork already faxed in with 10/09/19 as return to [facility] date. Physical form Has been updated and sent to medical records for scan.

## 2020-02-19 NOTE — Telephone Encounter (Signed)
Caller : Roaslie   Patient wanted Dr Charlett Blake to know that : For [employer] forms    Patient states she returned back to work October 13, 2019 (Friday) .Marland Kitchen    Patient states she also had the covid arm when getting  The covid  booster shot. Patient state she got injection at [facility]. Patient arm was swollen and red.

## 2020-02-19 NOTE — Telephone Encounter (Signed)
The form was faxed this morning prior to her calling with the corrected return to work date.

## 2020-02-19 NOTE — Telephone Encounter (Signed)
Patient states she just received a call from insurance. Insurance has denied the case due to some paperwork send from Dr. Dennison Bulla. Patient states do not fax the form.

## 2020-03-08 ENCOUNTER — Other Ambulatory Visit: Payer: Self-pay | Admitting: Obstetrics and Gynecology

## 2020-03-08 DIAGNOSIS — Z1231 Encounter for screening mammogram for malignant neoplasm of breast: Secondary | ICD-10-CM

## 2020-03-11 ENCOUNTER — Ambulatory Visit (INDEPENDENT_AMBULATORY_CARE_PROVIDER_SITE_OTHER): Payer: 59 | Admitting: Psychology

## 2020-03-11 DIAGNOSIS — F4322 Adjustment disorder with anxiety: Secondary | ICD-10-CM | POA: Diagnosis not present

## 2020-03-12 ENCOUNTER — Other Ambulatory Visit: Payer: Self-pay

## 2020-03-12 ENCOUNTER — Telehealth: Payer: Self-pay | Admitting: Family Medicine

## 2020-03-12 DIAGNOSIS — K5900 Constipation, unspecified: Secondary | ICD-10-CM

## 2020-03-12 NOTE — Telephone Encounter (Signed)
She should start with plain mucinex twice a day, Flonase 2 sprays each nostril daily, nasal saline twice a day and Cetirizine (or another antihistamine ie Allegra/Fexofenadine or Claritin/Loratadine 10 mg) twice a day. Consider Vitamin C, zinc and elderberry and increase hydration. What are her major symptoms and how long has she had them. If no improvement can do a virtual visit in next week to consider further treatment

## 2020-03-12 NOTE — Telephone Encounter (Signed)
Patient is requesting a Cologuard test ordered, patient states having a tuff time using bathroom, pebbles and blood in stool   Please Advise

## 2020-03-12 NOTE — Telephone Encounter (Signed)
Yes let her know we should do an ifit kit first as we have to rule out bleeding not cancer first.  

## 2020-03-12 NOTE — Telephone Encounter (Signed)
Patient states she needs medicine sent to pharmacy for ear and headache, sinus related . Patient feels ear is clogged.

## 2020-03-13 NOTE — Telephone Encounter (Signed)
Vm meaasge and mychart message sent to pt

## 2020-03-13 NOTE — Telephone Encounter (Signed)
My chart message sent to pt for reply from PCP

## 2020-03-13 NOTE — Telephone Encounter (Signed)
Kit placed in mail

## 2020-03-26 NOTE — Telephone Encounter (Signed)
Patient states she has received a stool kit in the mail and would like someone to send a new cologaurd kit, no envelope can with the kit she received.

## 2020-03-26 NOTE — Telephone Encounter (Signed)
attempted to contact pt for clarify envelopes are not apart of the stool kit that samples need to be delivered to lab. No answer when contacted

## 2020-04-01 ENCOUNTER — Ambulatory Visit: Payer: 59 | Admitting: Psychology

## 2020-04-01 ENCOUNTER — Ambulatory Visit: Payer: 59 | Admitting: Family Medicine

## 2020-04-01 ENCOUNTER — Other Ambulatory Visit: Payer: Self-pay

## 2020-04-01 VITALS — BP 110/60 | HR 87 | Temp 95.9°F | Resp 16 | Wt 108.0 lb

## 2020-04-01 DIAGNOSIS — S6990XA Unspecified injury of unspecified wrist, hand and finger(s), initial encounter: Secondary | ICD-10-CM | POA: Diagnosis not present

## 2020-04-01 DIAGNOSIS — M2550 Pain in unspecified joint: Secondary | ICD-10-CM

## 2020-04-01 DIAGNOSIS — K5900 Constipation, unspecified: Secondary | ICD-10-CM

## 2020-04-01 DIAGNOSIS — E785 Hyperlipidemia, unspecified: Secondary | ICD-10-CM

## 2020-04-01 DIAGNOSIS — R1031 Right lower quadrant pain: Secondary | ICD-10-CM | POA: Insufficient documentation

## 2020-04-01 DIAGNOSIS — R739 Hyperglycemia, unspecified: Secondary | ICD-10-CM

## 2020-04-01 DIAGNOSIS — R350 Frequency of micturition: Secondary | ICD-10-CM

## 2020-04-01 DIAGNOSIS — J4520 Mild intermittent asthma, uncomplicated: Secondary | ICD-10-CM

## 2020-04-01 MED ORDER — AZELASTINE HCL 0.1 % NA SOLN: 2.0000 | Freq: Two times a day (BID) | NASAL | 5 refills | Status: DC

## 2020-04-01 MED ORDER — MOMETASONE FUROATE 50 MCG/ACT NA SUSP: 2.0000 | Freq: Every day | NASAL | 12 refills | Status: DC

## 2020-04-01 MED ORDER — HYDROCORTISONE ACETATE 25 MG RE SUPP: 25.0000 mg | Freq: Every evening | RECTAL | 1 refills | Status: DC | PRN

## 2020-04-01 NOTE — Patient Instructions (Signed)
Curcumen/Turmeric twice a day.  COVID-19 COVID-19 is a respiratory infection that is caused by a virus called severe acute respiratory syndrome coronavirus 2 (SARS-CoV-2). The disease is also known as coronavirus disease or novel coronavirus. In some people, the virus may not cause any symptoms. In others, it may cause a serious infection. The infection can get worse quickly and can lead to complications, such as:  Pneumonia, or infection of the lungs.  Acute respiratory distress syndrome or ARDS. This is a condition in which fluid build-up in the lungs prevents the lungs from filling with air and passing oxygen into the blood.  Acute respiratory failure. This is a condition in which there is not enough oxygen passing from the lungs to the body or when carbon dioxide is not passing from the lungs out of the body.  Sepsis or septic shock. This is a serious bodily reaction to an infection.  Blood clotting problems.  Secondary infections due to bacteria or fungus.  Organ failure. This is when your body's organs stop working. The virus that causes COVID-19 is contagious. This means that it can spread from person to person through droplets from coughs and sneezes (respiratory secretions). What are the causes? This illness is caused by a virus. You may catch the virus by:  Breathing in droplets from an infected person. Droplets can be spread by a person breathing, speaking, singing, coughing, or sneezing.  Touching something, like a table or a doorknob, that was exposed to the virus (contaminated) and then touching your mouth, nose, or eyes. What increases the risk? Risk for infection You are more likely to be infected with this virus if you:  Are within 6 feet (2 meters) of a person with COVID-19.  Provide care for or live with a person who is infected with COVID-19.  Spend time in crowded indoor spaces or live in shared housing. Risk for serious illness You are more likely to become  seriously ill from the virus if you:  Are 19 years of age or older. The higher your age, the more you are at risk for serious illness.  Live in a nursing home or long-term care facility.  Have cancer.  Have a long-term (chronic) disease such as: ? Chronic lung disease, including chronic obstructive pulmonary disease or asthma. ? A long-term disease that lowers your body's ability to fight infection (immunocompromised). ? Heart disease, including heart failure, a condition in which the arteries that lead to the heart become narrow or blocked (coronary artery disease), a disease which makes the heart muscle thick, weak, or stiff (cardiomyopathy). ? Diabetes. ? Chronic kidney disease. ? Sickle cell disease, a condition in which red blood cells have an abnormal "sickle" shape. ? Liver disease.  Are obese. What are the signs or symptoms? Symptoms of this condition can range from mild to severe. Symptoms may appear any time from 2 to 14 days after being exposed to the virus. They include:  A fever or chills.  A cough.  Difficulty breathing.  Headaches, body aches, or muscle aches.  Runny or stuffy (congested) nose.  A sore throat.  New loss of taste or smell. Some people may also have stomach problems, such as nausea, vomiting, or diarrhea. Other people may not have any symptoms of COVID-19. How is this diagnosed? This condition may be diagnosed based on:  Your signs and symptoms, especially if: ? You live in an area with a COVID-19 outbreak. ? You recently traveled to or from an area where the  virus is common. ? You provide care for or live with a person who was diagnosed with COVID-19. ? You were exposed to a person who was diagnosed with COVID-19.  A physical exam.  Lab tests, which may include: ? Taking a sample of fluid from the back of your nose and throat (nasopharyngeal fluid), your nose, or your throat using a swab. ? A sample of mucus from your lungs  (sputum). ? Blood tests.  Imaging tests, which may include, X-rays, CT scan, or ultrasound. How is this treated? At present, there is no medicine to treat COVID-19. Medicines that treat other diseases are being used on a trial basis to see if they are effective against COVID-19. Your health care provider will talk with you about ways to treat your symptoms. For most people, the infection is mild and can be managed at home with rest, fluids, and over-the-counter medicines. Treatment for a serious infection usually takes places in a hospital intensive care unit (ICU). It may include one or more of the following treatments. These treatments are given until your symptoms improve.  Receiving fluids and medicines through an IV.  Supplemental oxygen. Extra oxygen is given through a tube in the nose, a face mask, or a hood.  Positioning you to lie on your stomach (prone position). This makes it easier for oxygen to get into the lungs.  Continuous positive airway pressure (CPAP) or bi-level positive airway pressure (BPAP) machine. This treatment uses mild air pressure to keep the airways open. A tube that is connected to a motor delivers oxygen to the body.  Ventilator. This treatment moves air into and out of the lungs by using a tube that is placed in your windpipe.  Tracheostomy. This is a procedure to create a hole in the neck so that a breathing tube can be inserted.  Extracorporeal membrane oxygenation (ECMO). This procedure gives the lungs a chance to recover by taking over the functions of the heart and lungs. It supplies oxygen to the body and removes carbon dioxide. Follow these instructions at home: Lifestyle  If you are sick, stay home except to get medical care. Your health care provider will tell you how long to stay home. Call your health care provider before you go for medical care.  Rest at home as told by your health care provider.  Do not use any products that contain nicotine  or tobacco, such as cigarettes, e-cigarettes, and chewing tobacco. If you need help quitting, ask your health care provider.  Return to your normal activities as told by your health care provider. Ask your health care provider what activities are safe for you. General instructions  Take over-the-counter and prescription medicines only as told by your health care provider.  Drink enough fluid to keep your urine pale yellow.  Keep all follow-up visits as told by your health care provider. This is important. How is this prevented?  There is no vaccine to help prevent COVID-19 infection. However, there are steps you can take to protect yourself and others from this virus. To protect yourself:   Do not travel to areas where COVID-19 is a risk. The areas where COVID-19 is reported change often. To identify high-risk areas and travel restrictions, check the CDC travel website: FatFares.com.br  If you live in, or must travel to, an area where COVID-19 is a risk, take precautions to avoid infection. ? Stay away from people who are sick. ? Wash your hands often with soap and water for 20  seconds. If soap and water are not available, use an alcohol-based hand sanitizer. ? Avoid touching your mouth, face, eyes, or nose. ? Avoid going out in public, follow guidance from your state and local health authorities. ? If you must go out in public, wear a cloth face covering or face mask. Make sure your mask covers your nose and mouth. ? Avoid crowded indoor spaces. Stay at least 6 feet (2 meters) away from others. ? Disinfect objects and surfaces that are frequently touched every day. This may include:  Counters and tables.  Doorknobs and light switches.  Sinks and faucets.  Electronics, such as phones, remote controls, keyboards, computers, and tablets. To protect others: If you have symptoms of COVID-19, take steps to prevent the virus from spreading to others.  If you think you have  a COVID-19 infection, contact your health care provider right away. Tell your health care team that you think you may have a COVID-19 infection.  Stay home. Leave your house only to seek medical care. Do not use public transport.  Do not travel while you are sick.  Wash your hands often with soap and water for 20 seconds. If soap and water are not available, use alcohol-based hand sanitizer.  Stay away from other members of your household. Let healthy household members care for children and pets, if possible. If you have to care for children or pets, wash your hands often and wear a mask. If possible, stay in your own room, separate from others. Use a different bathroom.  Make sure that all people in your household wash their hands well and often.  Cough or sneeze into a tissue or your sleeve or elbow. Do not cough or sneeze into your hand or into the air.  Wear a cloth face covering or face mask. Make sure your mask covers your nose and mouth. Where to find more information  Centers for Disease Control and Prevention: PurpleGadgets.be  World Health Organization: https://www.castaneda.info/ Contact a health care provider if:  You live in or have traveled to an area where COVID-19 is a risk and you have symptoms of the infection.  You have had contact with someone who has COVID-19 and you have symptoms of the infection. Get help right away if:  You have trouble breathing.  You have pain or pressure in your chest.  You have confusion.  You have bluish lips and fingernails.  You have difficulty waking from sleep.  You have symptoms that get worse. These symptoms may represent a serious problem that is an emergency. Do not wait to see if the symptoms will go away. Get medical help right away. Call your local emergency services (911 in the [place] Do not drive yourself to the hospital. Let the emergency medical personnel know if you think you have  COVID-19. Summary  COVID-19 is a respiratory infection that is caused by a virus. It is also known as coronavirus disease or novel coronavirus. It can cause serious infections, such as pneumonia, acute respiratory distress syndrome, acute respiratory failure, or sepsis.  The virus that causes COVID-19 is contagious. This means that it can spread from person to person through droplets from breathing, speaking, singing, coughing, or sneezing.  You are more likely to develop a serious illness if you are 101 years of age or older, have a weak immune system, live in a nursing home, or have chronic disease.  There is no medicine to treat COVID-19. Your health care provider will talk with you about  ways to treat your symptoms.  Take steps to protect yourself and others from infection. Wash your hands often and disinfect objects and surfaces that are frequently touched every day. Stay away from people who are sick and wear a mask if you are sick. This information is not intended to replace advice given to you by your health care provider. Make sure you discuss any questions you have with your health care provider. Document Revised: 02/10/2019 Document Reviewed: 05/19/2018 Elsevier Patient Education  [place].

## 2020-04-01 NOTE — Assessment & Plan Note (Signed)
hgba1c acceptable, minimize simple carbs. Increase exercise as tolerated.  

## 2020-04-01 NOTE — Assessment & Plan Note (Addendum)
Hurt her hand at work on her desk, right hand, it has been a couple weeks and while it is improving but still hurts at times. Encouraged topical treatments, tylenol and keep moving

## 2020-04-01 NOTE — Assessment & Plan Note (Signed)
Notes PND and clear rhinorrhea. Start Astelin 2 sprays bid and Nasonex bid.

## 2020-04-01 NOTE — Progress Notes (Signed)
Subjective:    Patient ID: [name], female    DOB: Sep 28, 1965, 54 y.o.   MRN: [id number]  Chief Complaint  Patient presents with  . Hand Injury    HPI Patient is in today for follow up on chronic medical concerns. No recent febrile illness or hospitalizations. She has returned to work and did injure her hand on her desk a couple of weeks ago. No pain is improving but still present some. She denies polyuria or polydipsia. She notes increased arthralgias with increased work. Denies CP/palp/SOB/HA/congestion/fevers/GI or GU c/o. Taking meds as prescribed  Past Medical History:  Diagnosis Date  . Allergic rhinitis   . Anemia   . Anxiety   . Asthma   . Contact dermatitis and eczema   . Diverticulitis    CT Scan  . Esophageal reflux   . H. pylori infection   . Hyperlipidemia, mixed   . IBS (irritable bowel syndrome)   . IC (interstitial cystitis)   . Internal hemorrhoids   . Lumbago   . Mitral valve disorders(424.0)   . Osteoarthritis 07/08/2015  . Post concussive syndrome   . Seasonal and perennial allergic rhinitis 01/20/2015    Past Surgical History:  Procedure Laterality Date  . BARTHOLIN CYST MARSUPIALIZATION N/A 12/18/2013   Procedure: BARTHOLIN CYST MARSUPIALIZATION WITH BIOPSY;  Surgeon: Lovenia Kim, MD;  Location: [facility];  Service: Gynecology;  Laterality: N/A;  . CESAREAN SECTION    . DILITATION & CURRETTAGE/HYSTROSCOPY WITH NOVASURE ABLATION N/A 10/14/2012   Procedure: DILATATION & CURETTAGE/HYSTEROSCOPY WITH NOVASURE ABLATION;  Surgeon: Lovenia Kim, MD;  Location: [facility];  Service: Gynecology;  Laterality: N/A;  . MOUTH SURGERY    . SINUS SURGERY WITH INSTATRAK      Family History  Problem Relation Age of Onset  . Colon polyps Sister   . Mental illness Sister        anxiety from 9/11 survivors  . Varicose Veins Sister   . Osteoporosis Sister   . Diabetes Mother   . Hypertension Mother   . Heart attack Mother        MI at age 23  . Alcohol  abuse Mother        smoker  . Heart disease Mother   . Dementia Mother   . Hypertension Father   . Hyperlipidemia Father   . Heart attack Father   . Cancer Father        lung cancer, smoker  . Cancer Brother        acute leukemia  . Dementia Maternal Grandmother   . Heart disease Maternal Grandfather        mi  . Cancer Paternal Grandmother        GYN cancer  . Other Sister        hypoglycemia  . Varicose Veins Sister   . Rheum arthritis Sister   . Pleurisy Sister   . Fibromyalgia Sister   . Mental retardation Sister        depression  . GER disease Son   . Alcohol abuse Other        Family history  . Colon cancer Neg Hx   . Breast cancer Neg Hx     Social History   Socioeconomic History  . Marital status: Married    Spouse name: Not on file  . Number of children: 2  . Years of education: Masters  . Highest education level: Not on file  Occupational History  . Occupation: [employer]  Social Services  Tobacco Use  . Smoking status: Never Smoker  . Smokeless tobacco: Never Used  Vaping Use  . Vaping Use: Never used  Substance and Sexual Activity  . Alcohol use: Not Currently  . Drug use: No  . 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 Not on file  Social History Narrative   Lives at home home with husband and son.   Right-handed.   1 cup caffeine daily.   Social Determinants of Health   Financial Resource Strain:   . Difficulty of Paying Living Expenses: Not on file  Food Insecurity:   . Worried About Charity fundraiser in the Last Year: Not on file  . Ran Out of Food in the Last Year: Not on file  Transportation Needs:   . Lack of Transportation (Medical): Not on file  . Lack of Transportation (Non-Medical): Not on file  Physical Activity:   . Days of Exercise per Week: Not on file  . Minutes of Exercise per Session: Not on file  Stress:   . Feeling  of Stress : Not on file  Social Connections:   . Frequency of Communication with Friends and Family: Not on file  . Frequency of Social Gatherings with Friends and Family: Not on file  . Attends Religious Services: Not on file  . Active Member of Clubs or Organizations: Not on file  . Attends Archivist Meetings: Not on file  . Marital Status: Not on file  Intimate Partner Violence:   . Fear of Current or Ex-Partner: Not on file  . Emotionally Abused: Not on file  . Physically Abused: Not on file  . Sexually Abused: Not on file    Outpatient Medications Prior to Visit  Medication Sig Dispense Refill  . hydrOXYzine (ATARAX/VISTARIL) 10 MG tablet Take 1-2 tablets (10-20 mg total) by mouth every 8 (eight) hours as needed for anxiety. 60 tablet 2  . montelukast (SINGULAIR) 5 MG chewable tablet Chew 1 tablet (5 mg total) by mouth at bedtime as needed. 30 tablet 2  . Multiple Vitamins-Minerals (MULTIVITAMIN ADULT PO) Take 1 tablet by mouth daily.    . Omega-3 Fatty Acids (FISH OIL) 1000 MG CAPS Take by mouth.    Marland Kitchen OVER THE COUNTER MEDICATION Calcium    . Probiotic Product (PROBIOTIC DAILY PO) Take 1 tablet by mouth daily.    . sertraline (ZOLOFT) 25 MG tablet Take 0.5-1 tablets (12.5-25 mg total) by mouth daily. 30 tablet 3  . sodium chloride (OCEAN) 0.65 % SOLN nasal spray Place 1 spray into both nostrils as needed for congestion.     No facility-administered medications prior to visit.    Allergies  Allergen Reactions  . Amoxicillin Shortness Of Breath    Doesn't remember rxn  . Aspirin Shortness Of Breath    "irritated stomach" Ibuprofen and Naproxen do not agree with her either  . Codeine Hives and Shortness Of Breath    Does not take percocet or hydrocodone; tylenol only  . Latex Shortness Of Breath and Itching  . Other Shortness Of Breath    peanuts  . Sulfa Antibiotics Hives and Shortness Of Breath  . Sulfamethoxazole-Trimethoprim Shortness Of Breath  .  Sulfonamide Derivatives Shortness Of Breath and Itching  . Sulphadimidine Sodium [Sulfamethazine Sodium] Shortness Of Breath    Added to food for preservative  . Ciprofloxacin Other (See Comments)  REACTION: SOB, "Tightness in head" REACTION: SOB, "Tightness in head" Syncope   . Azithromycin Other (See Comments)    Faintness  . Carisoprodol   . Clarithromycin     "Doesn't agree with me"  . Epinephrine     Heart racing  . Fenofibrate Other (See Comments)    Leg cramps, body aches   . Metronidazole Other (See Comments)    "cannot tolerate"  . Nsaids   . Penicillins     Doesn't remember reaction  . Tolmetin   . Omeprazole Palpitations    Review of Systems  Constitutional: Negative for fever and malaise/fatigue.  HENT: Negative for congestion.   Eyes: Negative for blurred vision.  Respiratory: Negative for shortness of breath.   Cardiovascular: Negative for chest pain, palpitations and leg swelling.  Gastrointestinal: Negative for abdominal pain, blood in stool and nausea.  Genitourinary: Negative for dysuria and frequency.  Musculoskeletal: Positive for joint pain. Negative for falls.  Skin: Negative for rash.  Neurological: Negative for dizziness, loss of consciousness and headaches.  Endo/Heme/Allergies: Negative for environmental allergies.  Psychiatric/Behavioral: Negative for depression. The patient is not nervous/anxious.        Objective:    Physical Exam Vitals and nursing note reviewed.  Constitutional:      General: She is not in acute distress.    Appearance: She is well-developed.  HENT:     Head: Normocephalic and atraumatic.     Nose: Nose normal.  Eyes:     General:        Right eye: No discharge.        Left eye: No discharge.  Cardiovascular:     Rate and Rhythm: Normal rate and regular rhythm.     Heart sounds: No murmur heard.   Pulmonary:     Effort: Pulmonary effort is normal.     Breath sounds: Normal breath sounds.  Abdominal:      General: Bowel sounds are normal.     Palpations: Abdomen is soft.     Tenderness: There is no abdominal tenderness.  Musculoskeletal:     Cervical back: Normal range of motion and neck supple.  Skin:    General: Skin is warm and dry.  Neurological:     Mental Status: She is alert and oriented to person, place, and time.     Wt 108 lb (49 kg)   BMI 21.81 kg/m  Wt Readings from Last 3 Encounters:  04/01/20 108 lb (49 kg)  01/24/20 106 lb (48.1 kg)  09/12/19 103 lb 3.2 oz (46.8 kg)    Diabetic Foot Exam - Simple   No data filed     Lab Results  Component Value Date   WBC 5.4 08/14/2019   HGB 13.8 08/14/2019   HCT 40.2 08/14/2019   PLT 295.0 08/14/2019   GLUCOSE 72 08/14/2019   CHOL 199 08/14/2019   TRIG 82.0 08/14/2019   HDL 58.10 08/14/2019   LDLDIRECT 143.0 01/17/2019   LDLCALC 125 (H) 08/14/2019   ALT 13 08/14/2019   AST 15 08/14/2019   NA 140 08/14/2019   K 4.0 08/14/2019   CL 103 08/14/2019   CREATININE 0.62 08/14/2019   BUN 17 08/14/2019   CO2 29 08/14/2019   TSH 2.27 08/14/2019   HGBA1C 5.0 08/14/2019    Lab Results  Component Value Date   TSH 2.27 08/14/2019   Lab Results  Component Value Date   WBC 5.4 08/14/2019   HGB 13.8 08/14/2019   HCT 40.2 08/14/2019  MCV 98.4 08/14/2019   PLT 295.0 08/14/2019   Lab Results  Component Value Date   NA 140 08/14/2019   K 4.0 08/14/2019   CO2 29 08/14/2019   GLUCOSE 72 08/14/2019   BUN 17 08/14/2019   CREATININE 0.62 08/14/2019   BILITOT 1.0 08/14/2019   ALKPHOS 86 08/14/2019   AST 15 08/14/2019   ALT 13 08/14/2019   PROT 7.0 08/14/2019   ALBUMIN 4.5 08/14/2019   CALCIUM 9.6 08/14/2019   ANIONGAP 10 01/09/2019   GFR 100.19 08/14/2019   Lab Results  Component Value Date   CHOL 199 08/14/2019   Lab Results  Component Value Date   HDL 58.10 08/14/2019   Lab Results  Component Value Date   LDLCALC 125 (H) 08/14/2019   Lab Results  Component Value Date   TRIG 82.0 08/14/2019   Lab  Results  Component Value Date   CHOLHDL 3 08/14/2019   Lab Results  Component Value Date   HGBA1C 5.0 08/14/2019       Assessment & Plan:   Problem List Items Addressed This Visit    Hand injury    Hurt her hand at work         I am having [name] maintain her Probiotic Product (PROBIOTIC DAILY PO), OVER THE COUNTER MEDICATION, Multiple Vitamins-Minerals (MULTIVITAMIN ADULT PO), Fish Oil, sodium chloride, sertraline, hydrOXYzine, and montelukast.  No orders of the defined types were placed in this encounter.    Penni Homans, MD

## 2020-04-01 NOTE — Assessment & Plan Note (Addendum)
Has had to strain with small pebbles at times. Encouraged increased hydration and fiber in diet. Daily probiotics. If bowels not moving can use MOM 2 tbls po in 4 oz of warm prune juice by mouth every 2-3 days. If no results then repeat in 4 hours with  Dulcolax suppository pr, may repeat again in 4 more hours as needed. Seek care if symptoms worsen. Consider daily Miralax and/or Dulcolax if symptoms persist.

## 2020-04-02 ENCOUNTER — Other Ambulatory Visit: Payer: Self-pay

## 2020-04-02 DIAGNOSIS — R350 Frequency of micturition: Secondary | ICD-10-CM

## 2020-04-02 LAB — COMPREHENSIVE METABOLIC PANEL
ALT: 14 U/L (ref 0–35)
AST: 15 U/L (ref 0–37)
Albumin: 4.4 g/dL (ref 3.5–5.2)
Alkaline Phosphatase: 88 U/L (ref 39–117)
BUN: 16 mg/dL (ref 6–23)
CO2: 29 mEq/L (ref 19–32)
Calcium: 9.4 mg/dL (ref 8.4–10.5)
Chloride: 102 mEq/L (ref 96–112)
Creatinine, Ser: 0.61 mg/dL (ref 0.40–1.20)
GFR: 100.98 mL/min (ref >60.00)
Glucose, Bld: 83 mg/dL (ref 70–99)
Potassium: 3.7 mEq/L (ref 3.5–5.1)
Sodium: 138 mEq/L (ref 135–145)
Total Bilirubin: 0.5 mg/dL (ref 0.2–1.2)
Total Protein: 7 g/dL (ref 6.0–8.3)

## 2020-04-02 LAB — LIPID PANEL
Cholesterol: 212 mg/dL — ABNORMAL HIGH (ref 0–200)
HDL: 51.4 mg/dL (ref >39.00)
LDL Cholesterol: 132 mg/dL — ABNORMAL HIGH (ref 0–99)
NonHDL: 160.24
Total CHOL/HDL Ratio: 4
Triglycerides: 139 mg/dL (ref 0.0–149.0)
VLDL: 27.8 mg/dL (ref 0.0–40.0)

## 2020-04-02 LAB — CBC
HCT: 39.7 % (ref 36.0–46.0)
Hemoglobin: 13.6 g/dL (ref 12.0–15.0)
MCHC: 34.2 g/dL (ref 30.0–36.0)
MCV: 97.6 fl (ref 78.0–100.0)
Platelets: 280 10*3/uL (ref 150.0–400.0)
RBC: 4.06 Mil/uL (ref 3.87–5.11)
RDW: 12.5 % (ref 11.5–15.5)
WBC: 5.1 10*3/uL (ref 4.0–10.5)

## 2020-04-02 LAB — HEMOGLOBIN A1C: Hgb A1c MFr Bld: 5.2 % (ref 4.6–6.5)

## 2020-04-02 LAB — TSH: TSH: 1.8 u[IU]/mL (ref 0.35–4.50)

## 2020-04-02 LAB — HIGH SENSITIVITY CRP: CRP, High Sensitivity: 2.28 mg/L (ref 0.000–5.000)

## 2020-04-02 LAB — SEDIMENTATION RATE: Sed Rate: 16 mm/hr (ref 0–30)

## 2020-04-02 NOTE — Addendum Note (Signed)
Addended by: Kelle Darting A on: 04/02/2020 09:50 AM   Modules accepted: Orders

## 2020-04-03 LAB — URINE CULTURE
MICRO NUMBER:: 11286396
Result:: NO GROWTH
SPECIMEN QUALITY:: ADEQUATE

## 2020-04-06 ENCOUNTER — Ambulatory Visit (HOSPITAL_BASED_OUTPATIENT_CLINIC_OR_DEPARTMENT_OTHER)
Admission: RE | Admit: 2020-04-06 | Discharge: 2020-04-06 | Disposition: A | Discharge status: Home / Self Care | Payer: 59 | Source: Ambulatory Visit | Attending: Family Medicine | Admitting: Family Medicine

## 2020-04-06 ENCOUNTER — Other Ambulatory Visit: Payer: Self-pay

## 2020-04-06 DIAGNOSIS — R1031 Right lower quadrant pain: Secondary | ICD-10-CM | POA: Insufficient documentation

## 2020-04-09 ENCOUNTER — Other Ambulatory Visit (HOSPITAL_BASED_OUTPATIENT_CLINIC_OR_DEPARTMENT_OTHER): Payer: 59

## 2020-04-18 ENCOUNTER — Other Ambulatory Visit: Payer: Self-pay

## 2020-04-18 ENCOUNTER — Ambulatory Visit
Admission: RE | Admit: 2020-04-18 | Discharge: 2020-04-18 | Disposition: A | Discharge status: Home / Self Care | Payer: 59 | Source: Ambulatory Visit | Attending: Obstetrics and Gynecology | Admitting: Obstetrics and Gynecology

## 2020-04-18 DIAGNOSIS — Z1231 Encounter for screening mammogram for malignant neoplasm of breast: Secondary | ICD-10-CM

## 2020-04-18 LAB — HM MAMMOGRAPHY

## 2020-05-15 NOTE — Progress Notes (Signed)
HPI: FU CP.ABIs May 2017 normal. Monitor October 2017 showed sinus rhythm. Also seen for syncopal episode in October 2017. MRI negative. EEG normal. Seen by neurology and felt to have postconcussive syndrome. Exercise treadmill October 2018 was negative. Echocardiogram March 2019 showed normal LV systolic function.   Since last seen there is no dyspnea on exertion, orthopnea, PND, pedal edema, chest pain or syncope.  She did have a near syncopal episode while having a bowel movement we will oriented.  Current Outpatient Medications  Medication Sig Dispense Refill  . azelastine (ASTELIN) 0.1 % nasal spray Place 2 sprays into both nostrils 2 (two) times daily. Use in each nostril as directed 30 mL 5  . Flaxseed, Linseed, (FLAX SEED OIL PO) Take 1,000 mg by mouth as directed.    . hydrocortisone (ANUSOL-HC) 25 MG suppository Place 1 suppository (25 mg total) rectally at bedtime as needed for hemorrhoids or anal itching. 12 suppository 1  . hydrOXYzine (ATARAX/VISTARIL) 10 MG tablet Take 1-2 tablets (10-20 mg total) by mouth every 8 (eight) hours as needed for anxiety. 60 tablet 2  . mometasone (NASONEX) 50 MCG/ACT nasal spray Place 2 sprays into the nose daily. 1 each 12  . Multiple Vitamins-Minerals (MULTIVITAMIN ADULT PO) Take 1 tablet by mouth daily.    Marland Kitchen OVER THE COUNTER MEDICATION Calcium    . OVER THE COUNTER MEDICATION Omega 3 2100    . Probiotic Product (PROBIOTIC DAILY PO) Take 1 tablet by mouth daily.    . sertraline (ZOLOFT) 25 MG tablet Take 0.5-1 tablets (12.5-25 mg total) by mouth daily. 30 tablet 3  . sodium chloride (OCEAN) 0.65 % SOLN nasal spray Place 1 spray into both nostrils as needed for congestion.     No current facility-administered medications for this visit.     Past Medical History:  Diagnosis Date  . Allergic rhinitis   . Anemia   . Anxiety   . Asthma   . Contact dermatitis and eczema   . Diverticulitis    CT Scan  . Esophageal reflux   . H.  pylori infection   . Hyperlipidemia, mixed   . IBS (irritable bowel syndrome)   . IC (interstitial cystitis)   . Internal hemorrhoids   . Lumbago   . Mitral valve disorders(424.0)   . Osteoarthritis 07/08/2015  . Post concussive syndrome   . Seasonal and perennial allergic rhinitis 01/20/2015    Past Surgical History:  Procedure Laterality Date  . BARTHOLIN CYST MARSUPIALIZATION N/A 12/18/2013   Procedure: BARTHOLIN CYST MARSUPIALIZATION WITH BIOPSY;  Surgeon: Lovenia Kim, MD;  Location: [facility];  Service: Gynecology;  Laterality: N/A;  . CESAREAN SECTION    . DILITATION & CURRETTAGE/HYSTROSCOPY WITH NOVASURE ABLATION N/A 10/14/2012   Procedure: DILATATION & CURETTAGE/HYSTEROSCOPY WITH NOVASURE ABLATION;  Surgeon: Lovenia Kim, MD;  Location: [facility];  Service: Gynecology;  Laterality: N/A;  . MOUTH SURGERY    . SINUS SURGERY WITH INSTATRAK      Social History   Socioeconomic History  . Marital status: Married    Spouse name: Not on file  . Number of children: 2  . Years of education: Masters  . Highest education level: Not on file  Occupational History  . Occupation: [employer]  Tobacco Use  . Smoking status: Never Smoker  . Smokeless tobacco: Never Used  Vaping Use  . Vaping Use: Never used  Substance and Sexual Activity  . Alcohol use: Not Currently  . Drug  use: No  . 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 Not on file  Social History Narrative   Lives at home home with husband and son.   Right-handed.   1 cup caffeine daily.   Social Determinants of Health   Financial Resource Strain: Not on file  Food Insecurity: Not on file  Transportation Needs: Not on file  Physical Activity: Not on file  Stress: Not on file  Social Connections: Not on file  Intimate Partner Violence: Not on file    Family History  Problem Relation Age of Onset   . Colon polyps Sister   . Mental illness Sister        anxiety from 9/11 survivors  . Varicose Veins Sister   . Osteoporosis Sister   . Diabetes Mother   . Hypertension Mother   . Heart attack Mother        MI at age 40  . Alcohol abuse Mother        smoker  . Heart disease Mother   . Dementia Mother   . Hypertension Father   . Hyperlipidemia Father   . Heart attack Father   . Cancer Father        lung cancer, smoker  . Cancer Brother        acute leukemia  . Dementia Maternal Grandmother   . Heart disease Maternal Grandfather        mi  . Cancer Paternal Grandmother        GYN cancer  . Other Sister        hypoglycemia  . Varicose Veins Sister   . Rheum arthritis Sister   . Pleurisy Sister   . Fibromyalgia Sister   . Mental retardation Sister        depression  . GER disease Son   . Alcohol abuse Other        Family history  . Colon cancer Neg Hx   . Breast cancer Neg Hx     ROS: no fevers or chills, productive cough, hemoptysis, dysphasia, odynophagia, melena, hematochezia, dysuria, hematuria, rash, seizure activity, orthopnea, PND, pedal edema, claudication. Remaining systems are negative.  Physical Exam: Well-developed well-nourished in no acute distress.  Skin is warm and dry.  HEENT is normal.  Neck is supple.  Chest is clear to auscultation with normal expansion.  Cardiovascular exam is regular rate and rhythm.  Abdominal exam nontender or distended. No masses palpated. Extremities show no edema. neuro grossly intact  ECG-normal sinus rhythm at a rate of 82, no ST changes.  Personally reviewed  A/P  1 history of syncope-previous episodes felt likely vagal in etiology.  Previous echocardiogram showed normal LV function.  2 chest pain-no recurrences.  3 hyperlipidemia-managed by primary care.  Given family history of coronary disease we will arrange a calcium score for risk stratification.  4 question history of mitral valve prolapse-this was not  evident on her most recent echocardiogram.  Kirk Ruths, MD

## 2020-05-22 ENCOUNTER — Other Ambulatory Visit: Payer: Self-pay

## 2020-05-22 ENCOUNTER — Ambulatory Visit: Payer: 59 | Admitting: Cardiology

## 2020-05-22 ENCOUNTER — Encounter: Payer: Self-pay | Admitting: Cardiology

## 2020-05-22 VITALS — BP 104/60 | HR 82 | Ht 59.0 in | Wt 104.1 lb

## 2020-05-22 DIAGNOSIS — R079 Chest pain, unspecified: Secondary | ICD-10-CM | POA: Diagnosis not present

## 2020-05-22 DIAGNOSIS — I059 Rheumatic mitral valve disease, unspecified: Secondary | ICD-10-CM | POA: Diagnosis not present

## 2020-05-22 DIAGNOSIS — R55 Syncope and collapse: Secondary | ICD-10-CM | POA: Diagnosis not present

## 2020-05-22 DIAGNOSIS — E785 Hyperlipidemia, unspecified: Secondary | ICD-10-CM | POA: Diagnosis not present

## 2020-05-22 NOTE — Patient Instructions (Signed)
  Testing/Procedures:  CARDIAC CT CALCIUM SCORING AT [place]: At [facility], you and your health needs are our priority.  As part of our continuing mission to provide you with exceptional heart care, we have created designated Provider Care Teams.  These Care Teams include your primary Cardiologist (physician) and Advanced Practice Providers (APPs -  Physician Assistants and Nurse Practitioners) who all work together to provide you with the care you need, when you need it.  We recommend signing up for the patient portal called "MyChart".  Sign up information is provided on this After Visit Summary.  MyChart is used to connect with patients for Virtual Visits (Telemedicine).  Patients are able to view lab/test results, encounter notes, upcoming appointments, etc.  Non-urgent messages can be sent to your provider as well.   To learn more about what you can do with MyChart, go to [email or web address].    Your next appointment:   12 month(s)  The format for your next appointment:   In Person  Provider:   Kirk Ruths, MD

## 2020-05-27 ENCOUNTER — Ambulatory Visit (INDEPENDENT_AMBULATORY_CARE_PROVIDER_SITE_OTHER)
Admission: RE | Admit: 2020-05-27 | Discharge: 2020-05-27 | Disposition: A | Discharge status: Home / Self Care | Payer: Self-pay | Source: Ambulatory Visit | Attending: Cardiology | Admitting: Cardiology

## 2020-05-27 ENCOUNTER — Other Ambulatory Visit: Payer: Self-pay

## 2020-05-27 DIAGNOSIS — R55 Syncope and collapse: Secondary | ICD-10-CM

## 2020-07-11 ENCOUNTER — Ambulatory Visit: Payer: 59 | Admitting: Internal Medicine

## 2020-07-11 LAB — HM COLONOSCOPY

## 2020-07-30 NOTE — Progress Notes (Signed)
Patient ID: [name], female    DOB: April 04, 1966, 55 y.o.   MRN: [id number]  HPI female never smoker followed for allergic rhinitis, asthma. Complicated by History anxiety, GERD, IBS Office Spirometry 08/01/2015-within normal. FVC 3.02/112%, FEV1 2.36/104%, FEV1/FVC 0.78, FEF 25-75 percent 2.26/84%. PFT 11/26/2010 within normal limits within significant response to bronchodilator. FEV1/FVC 0.79  --------------------------------------------------------------------------------  07/10/19- 55 year old female never smoker followed for allergic rhinitis, asthma.  Complicated by GERD, history anxiety, IBS Neb albuterol, albuterol hfa, benzonatate, flonase, -----f/u Exacerbation of asthma . Patient stated her breathing is at her baseline.  Had flu vax and 2 Covax Not needing bronchodilators currently Mild headache assoc w new eyeglasses. Dry R eye x few weeks. Denies sneeze, drainage, wheeze or cough. Notices sore, stiff joints.   07/31/20- 55 year old female never smoker followed for Allergic Rhinitis, Asthma.  Complicated by GERD, history Anxiety, IBS -Neb albuterol, albuterol hfa, benzonatate, flonase, Covid vax-3 Phizer,  Flu vax-had -----Congestion for last few weeks.  Since pollen started noting dry cough back of throat, minimal stuffiness, no chest congestion or wheeze. Increased nasal congestion. Had fairly intense local reaction on arm to her 3rd covid vax. Now resolved, but she asks if this could have inflamed her internal hemorrhoids. Admits constipation, insufficient water, has started Miralax.  Asks depomedrol.  Review of Systems-see HPI     + = positive Constitutional:   No-   weight loss, night sweats, fevers, chills, fatigue, lassitude. HEENT:    headaches, difficulty swallowing, tooth/dental problems, sore throat,       No-  sneezing, itching, ear ache, +nasal congestion, +post nasal drip,  CV:  No-   chest pain, orthopnea, PND, swelling in lower extremities, anasarca,  dizziness, palpitations Resp: shortness of breath with exertion or at rest.                +non-productive cough,  No- coughing up of blood.                 change in color of mucus.  No recent  wheezing.   Skin: No-   rash or lesions. GI:  No-   heartburn, indigestion, abdominal pain, nausea, vomiting, GU:  MS: + joint pain or swelling.   Neuro-     nothing unusual Psych:  No- change in mood or affect. No depression + anxiety.  No memory loss.  Objective:   Physical Exam General- Alert, Oriented, Affect-anxious/ talkative, Distress- none acute, trim, petite woman    Skin- rash-none, lesions- none, excoriation- none Lymphadenopathy- none Head- atraumatic            Eyes- Gross vision intact, PERRLA, conjunctivae clear secretions            Ears- Hearing, canals normal. +Scarring or sclerosis TMs            Nose- +Narrow with turbinate edema, clear mucus bridging, No- Septal dev, polyps, erosion, perforation             Throat- Mallampati III , mucosa clear , drainage- none, tonsils- atrophic Neck- flexible , trachea midline, no stridor , thyroid nl, carotid no bruit Chest - symmetrical excursion , unlabored           Heart/CV- RRR , no murmur , no gallop  , no rub, nl s1 s2                           - JVD- none , edema- none, stasis changes-  none, varices- none           Lung- clear to P&A, wheeze- none, cough- none , dullness-none, rub- none           Chest wall-  Abd-  Br/ Gen/ Rectal- Not done, not indicated Extrem- cyanosis- none, clubbing, none, atrophy- none, strength- nl Neuro- grossly intact to observation

## 2020-07-31 ENCOUNTER — Other Ambulatory Visit: Payer: Self-pay

## 2020-07-31 ENCOUNTER — Encounter: Payer: Self-pay | Admitting: Internal Medicine

## 2020-07-31 ENCOUNTER — Ambulatory Visit: Payer: 59 | Admitting: Internal Medicine

## 2020-07-31 VITALS — BP 112/70 | HR 86 | Temp 98.0°F | Ht 59.0 in | Wt 108.2 lb

## 2020-07-31 DIAGNOSIS — J302 Other seasonal allergic rhinitis: Secondary | ICD-10-CM

## 2020-07-31 DIAGNOSIS — J4520 Mild intermittent asthma, uncomplicated: Secondary | ICD-10-CM

## 2020-07-31 DIAGNOSIS — K5900 Constipation, unspecified: Secondary | ICD-10-CM | POA: Diagnosis not present

## 2020-07-31 DIAGNOSIS — J3089 Other allergic rhinitis: Secondary | ICD-10-CM

## 2020-07-31 MED ORDER — METHYLPREDNISOLONE ACETATE 80 MG/ML IJ SUSP
40.0000 mg | Freq: Once | INTRAMUSCULAR | Status: AC
  Administered 2020-07-31: 40 mg via INTRAMUSCULAR

## 2020-07-31 NOTE — Patient Instructions (Signed)
Order- Depo 40     Dx Seasonal and perennial allergic rhinitis  Try otc Nasalcrom (cromolyn) nasal spray  Try Zero Sugar Jolly Rancher candies ([employer], [employer], etc)  To soothe throat and help with cough  Please call if we can help

## 2020-07-31 NOTE — Assessment & Plan Note (Signed)
Discuss w PCP

## 2020-07-31 NOTE — Assessment & Plan Note (Signed)
Cough equivalent minimal exacerbation related to pollen Plan- depo40, steroid talk

## 2020-07-31 NOTE — Assessment & Plan Note (Signed)
Slight dry cough- postnasal drip or asthma equivalent in current pollen season. Plan- Discussed nasal sprays, antihistamines. Depo 40 w discussion.  Try soothing lozenges/ Veterinary surgeon.

## 2020-10-11 ENCOUNTER — Telehealth: Payer: Self-pay | Admitting: Family Medicine

## 2020-10-11 ENCOUNTER — Other Ambulatory Visit: Payer: Self-pay | Admitting: Family Medicine

## 2020-10-11 DIAGNOSIS — K219 Gastro-esophageal reflux disease without esophagitis: Secondary | ICD-10-CM

## 2020-10-11 DIAGNOSIS — M545 Low back pain, unspecified: Secondary | ICD-10-CM

## 2020-10-11 DIAGNOSIS — D649 Anemia, unspecified: Secondary | ICD-10-CM

## 2020-10-11 DIAGNOSIS — E785 Hyperlipidemia, unspecified: Secondary | ICD-10-CM

## 2020-10-11 DIAGNOSIS — R739 Hyperglycemia, unspecified: Secondary | ICD-10-CM

## 2020-10-11 NOTE — Telephone Encounter (Signed)
Pt, called requesting Brca gene testing because her younger sister was diagnosis with breast cancer she also wants lab order so she can come in prior to visit to have it done   Please advice

## 2020-10-14 NOTE — Telephone Encounter (Signed)
Pt is scheduled for 10/22/20

## 2020-10-22 ENCOUNTER — Other Ambulatory Visit: Payer: Self-pay

## 2020-10-22 ENCOUNTER — Ambulatory Visit: Payer: 59 | Admitting: Family Medicine

## 2020-10-22 VITALS — BP 100/76 | HR 81 | Resp 12 | Ht 59.0 in | Wt 102.0 lb

## 2020-10-22 DIAGNOSIS — R519 Headache, unspecified: Secondary | ICD-10-CM

## 2020-10-22 DIAGNOSIS — Z1371 Encounter for nonprocreative screening for genetic disease carrier status: Secondary | ICD-10-CM

## 2020-10-22 DIAGNOSIS — R350 Frequency of micturition: Secondary | ICD-10-CM

## 2020-10-22 DIAGNOSIS — R739 Hyperglycemia, unspecified: Secondary | ICD-10-CM | POA: Diagnosis not present

## 2020-10-22 DIAGNOSIS — E785 Hyperlipidemia, unspecified: Secondary | ICD-10-CM | POA: Diagnosis not present

## 2020-10-22 DIAGNOSIS — K219 Gastro-esophageal reflux disease without esophagitis: Secondary | ICD-10-CM

## 2020-10-22 DIAGNOSIS — Z1159 Encounter for screening for other viral diseases: Secondary | ICD-10-CM

## 2020-10-22 DIAGNOSIS — A048 Other specified bacterial intestinal infections: Secondary | ICD-10-CM

## 2020-10-22 DIAGNOSIS — K5900 Constipation, unspecified: Secondary | ICD-10-CM

## 2020-10-22 DIAGNOSIS — R079 Chest pain, unspecified: Secondary | ICD-10-CM | POA: Diagnosis not present

## 2020-10-22 DIAGNOSIS — J329 Chronic sinusitis, unspecified: Secondary | ICD-10-CM

## 2020-10-22 DIAGNOSIS — D649 Anemia, unspecified: Secondary | ICD-10-CM | POA: Diagnosis not present

## 2020-10-22 DIAGNOSIS — K649 Unspecified hemorrhoids: Secondary | ICD-10-CM

## 2020-10-22 DIAGNOSIS — J029 Acute pharyngitis, unspecified: Secondary | ICD-10-CM

## 2020-10-22 DIAGNOSIS — R059 Cough, unspecified: Secondary | ICD-10-CM

## 2020-10-22 DIAGNOSIS — I8393 Asymptomatic varicose veins of bilateral lower extremities: Secondary | ICD-10-CM

## 2020-10-22 LAB — POC URINALSYSI DIPSTICK (AUTOMATED)
Bilirubin, UA: NEGATIVE
Glucose, UA: NEGATIVE
Ketones, UA: NEGATIVE
Leukocytes, UA: NEGATIVE
Nitrite, UA: NEGATIVE
Protein, UA: NEGATIVE
Spec Grav, UA: <=1.005 — AB (ref 1.010–1.025)
Urobilinogen, UA: 0.2 E.U./dL
pH, UA: 6 (ref 5.0–8.0)

## 2020-10-22 MED ORDER — LINACLOTIDE 72 MCG PO CAPS: 72.0000 ug | ORAL_CAPSULE | Freq: Every day | ORAL | 3 refills | Status: DC

## 2020-10-22 NOTE — Patient Instructions (Addendum)
[facility], Dr Harl Bowie for hemorrhoids  Hemorrhoids Hemorrhoids are swollen veins in and around the rectum or anus. There are two types of hemorrhoids: Internal hemorrhoids. These occur in the veins that are just inside the rectum. They may poke through to the outside and become irritated and painful. External hemorrhoids. These occur in the veins that are outside the anus and can be felt as a painful swelling or hard lump near the anus. Most hemorrhoids do not cause serious problems, and they can be managed with home treatments such as diet and lifestyle changes. If home treatments do nothelp the symptoms, procedures can be done to shrink or remove the hemorrhoids. What are the causes? This condition is caused by increased pressure in the anal area. This pressure may result from various things, including: Constipation. Straining to have a bowel movement. Diarrhea. Pregnancy. Obesity. Sitting for long periods of time. Heavy lifting or other activity that causes you to strain. Anal sex. Riding a bike for a long period of time. What are the signs or symptoms? Symptoms of this condition include: Pain. Anal itching or irritation. Rectal bleeding. Leakage of stool (feces). Anal swelling. One or more lumps around the anus. How is this diagnosed? This condition can often be diagnosed through a visual exam. Other exams or tests may also be done, such as: An exam that involves feeling the rectal area with a gloved hand (digital rectal exam). An exam of the anal canal that is done using a small tube (anoscope). A blood test, if you have lost a significant amount of blood. A test to look inside the colon using a flexible tube with a camera on the end (sigmoidoscopy or colonoscopy). How is this treated? This condition can usually be treated at home. However, various procedures may be done if dietary changes, lifestyle changes, and other home treatments do not help your  symptoms. These procedures can help make the hemorrhoids smaller or remove them completely. Some of these procedures involve surgery, and others do not. Common procedures include: Rubber band ligation. Rubber bands are placed at the base of the hemorrhoids to cut off their blood supply. Sclerotherapy. Medicine is injected into the hemorrhoids to shrink them. Infrared coagulation. A type of light energy is used to get rid of the hemorrhoids. Hemorrhoidectomy surgery. The hemorrhoids are surgically removed, and the veins that supply them are tied off. Stapled hemorrhoidopexy surgery. The surgeon staples the base of the hemorrhoid to the rectal wall. Follow these instructions at home: Eating and drinking  Eat foods that have a lot of fiber in them, such as whole grains, beans, nuts, fruits, and vegetables. Ask your health care provider about taking products that have added fiber (fiber supplements). Reduce the amount of fat in your diet. You can do this by eating low-fat dairy products, eating less red meat, and avoiding processed foods. Drink enough fluid to keep your urine pale yellow.  Managing pain and swelling  Take warm sitz baths for 20 minutes, 3-4 times a day to ease pain and discomfort. You may do this in a bathtub or using a portable sitz bath that fits over the toilet. If directed, apply ice to the affected area. Using ice packs between sitz baths may be helpful. Put ice in a plastic bag. Place a towel between your skin and the bag. Leave the ice on for 20 minutes, 2-3 times a day.  General instructions Take over-the-counter and prescription medicines only as told by your health care provider. Use  medicated creams or suppositories as told. Get regular exercise. Ask your health care provider how much and what kind of exercise is best for you. In general, you should do moderate exercise for at least 30 minutes on most days of the week (150 minutes each week). This can include  activities such as walking, biking, or yoga. Go to the bathroom when you have the urge to have a bowel movement. Do not wait. Avoid straining to have bowel movements. Keep the anal area dry and clean. Use wet toilet paper or moist towelettes after a bowel movement. Do not sit on the toilet for long periods of time. This increases blood pooling and pain. Keep all follow-up visits as told by your health care provider. This is important. Contact a health care provider if you have: Increasing pain and swelling that are not controlled by treatment or medicine. Difficulty having a bowel movement, or you are unable to have a bowel movement. Pain or inflammation outside the area of the hemorrhoids. Get help right away if you have: Uncontrolled bleeding from your rectum. Summary Hemorrhoids are swollen veins in and around the rectum or anus. Most hemorrhoids can be managed with home treatments such as diet and lifestyle changes. Taking warm sitz baths can help ease pain and discomfort. In severe cases, procedures or surgery can be done to shrink or remove the hemorrhoids. This information is not intended to replace advice given to you by your health care provider. Make sure you discuss any questions you have with your healthcare provider. Document Revised: 09/09/2018 Document Reviewed: 09/02/2017 Elsevier Patient Education  [employer].

## 2020-10-22 NOTE — Progress Notes (Signed)
Patient ID: [name], female    DOB: Sep 17, 1965  Age: 55 y.o. MRN: [id number]    Subjective:  Subjective  HPI [name] presents for office visit today for follow up on hemorrhoids and IBS. She states that MiraLax did not work for her constipation and as a result has switched to Fiber Care which she states has worked. She states that if she does not drink enough water or goes running, then she notices more blood in her stool. She reports that she had taken linzess in the past which she states has worked, but she experienced vertigo and dizziness as a side effect. Therefore, she expresses interest in getting linzess on a lower dosage. She denies CP/palp/SOB/congestion/fevers or GU c/o. Taking meds as prescribed.   She expresses interest in getting a genetic test for concerns of breast cancer. She states that the same sister was dx with fatty liver. She expresses concern regarding her chest discomfort. She denies feeling CP or palpitations. She states that the chest discomfort frequency is sporadic. She states that she experiences sharp HA's on the left side of head that comes and goes. She states that she is not sure whether it is due to stress or lack of drinking water.  She reports going to a vein specialist because she has been experiencing her left leg locking up. She states that a problem with her leg was found, but she did not like her current specialist and would like another referral.  Review of Systems  Constitutional:  Negative for chills, fatigue and fever.  HENT:  Negative for congestion, rhinorrhea, sinus pressure, sinus pain and sore throat.        (+) scratchy and itchy throat  Eyes:  Negative for pain.  Respiratory:  Negative for cough and shortness of breath.        (+) chest discomfort  Cardiovascular:  Negative for chest pain, palpitations and leg swelling.  Gastrointestinal:  Positive for anal bleeding, blood in stool and constipation. Negative for abdominal pain,  diarrhea, nausea and vomiting.  Genitourinary:  Positive for frequency. Negative for decreased urine volume, flank pain, vaginal bleeding and vaginal discharge.  Musculoskeletal:  Negative for back pain.  Neurological:  Positive for headaches (sporadic and sharp).   History Past Medical History:  Diagnosis Date   Allergic rhinitis    Anemia    Anxiety    Asthma    Contact dermatitis and eczema    Diverticulitis    CT Scan   Esophageal reflux    H. pylori infection    Hyperlipidemia, mixed    IBS (irritable bowel syndrome)    IC (interstitial cystitis)    Internal hemorrhoids    Lumbago    Mitral valve disorders(424.0)    Osteoarthritis 07/08/2015   Post concussive syndrome    Seasonal and perennial allergic rhinitis 01/20/2015    She has a past surgical history that includes Cesarean section; Sinus surgery with Instatrak; Mouth surgery; Dilatation & currettage/hysteroscopy with novasure ablation (N/A, 10/14/2012); and Bartholin cyst marsupialization (N/A, 12/18/2013).   Her family history includes Alcohol abuse in her mother and another family member; Cancer in her brother, father, and paternal grandmother; Colon polyps in her sister; Dementia in her maternal grandmother and mother; Diabetes in her mother; Fibromyalgia in her sister; GER disease in her son; Heart attack in her father and mother; Heart disease in her maternal grandfather and mother; Hyperlipidemia in her father; Hypertension in her father and mother; Mental illness in her sister; Mental retardation  in her sister; Osteoporosis in her sister; Other in her sister; Pleurisy in her sister; Rheum arthritis in her sister; Varicose Veins in her sister and sister.She reports that she has never smoked. She has never used smokeless tobacco. She reports previous alcohol use. She reports that she does not use drugs.  Current Outpatient Medications on File Prior to Visit  Medication Sig Dispense Refill   Multiple Vitamins-Minerals  (MULTIVITAMIN ADULT PO) Take 1 tablet by mouth daily.     OVER THE COUNTER MEDICATION Calcium     OVER THE COUNTER MEDICATION Omega 3 2100     Probiotic Product (PROBIOTIC DAILY PO) Take 1 tablet by mouth daily.     sertraline (ZOLOFT) 25 MG tablet Take 0.5-1 tablets (12.5-25 mg total) by mouth daily. 30 tablet 3   sodium chloride (OCEAN) 0.65 % SOLN nasal spray Place 1 spray into both nostrils as needed for congestion.     No current facility-administered medications on file prior to visit.     Objective:  Objective  Physical Exam Constitutional:      General: She is not in acute distress.    Appearance: Normal appearance. She is not ill-appearing or toxic-appearing.  HENT:     Head: Normocephalic and atraumatic.     Right Ear: Tympanic membrane, ear canal and external ear normal.     Left Ear: Tympanic membrane, ear canal and external ear normal.     Nose: No congestion or rhinorrhea.     Mouth/Throat:     Mouth: Mucous membranes are moist.  Eyes:     Extraocular Movements: Extraocular movements intact.     Pupils: Pupils are equal, round, and reactive to light.  Cardiovascular:     Rate and Rhythm: Normal rate and regular rhythm.     Pulses: Normal pulses.     Heart sounds: Normal heart sounds. No murmur heard. Pulmonary:     Effort: Pulmonary effort is normal. No respiratory distress.     Breath sounds: Normal breath sounds. No wheezing, rhonchi or rales.  Abdominal:     General: Bowel sounds are normal.     Palpations: Abdomen is soft. There is no mass.     Tenderness: no abdominal tenderness There is no guarding.     Hernia: No hernia is present.  Musculoskeletal:        General: Normal range of motion.     Cervical back: Normal range of motion and neck supple.  Skin:    General: Skin is warm and dry.  Neurological:     Mental Status: She is alert and oriented to person, place, and time.  Psychiatric:        Behavior: Behavior normal.   BP 100/76 (BP Location:  Left Arm, Cuff Size: Normal) Comment: manual  Pulse 81   Resp 12   Ht 4\' 11"  (1.499 m)   Wt 102 lb (46.3 kg)   LMP 06/30/2016   SpO2 100%   BMI 20.60 kg/m  Wt Readings from Last 3 Encounters:  10/22/20 102 lb (46.3 kg)  07/31/20 108 lb 3.2 oz (49.1 kg)  05/22/20 104 lb 1.9 oz (47.2 kg)     Lab Results  Component Value Date   WBC 4.6 10/22/2020   HGB 13.3 10/22/2020   HCT 38.2 10/22/2020   PLT 273.0 10/22/2020   GLUCOSE 93 10/22/2020   CHOL 222 (H) 10/22/2020   TRIG 141.0 10/22/2020   HDL 54.90 10/22/2020   LDLDIRECT 143.0 01/17/2019   LDLCALC 139 (H) 10/22/2020  ALT 13 10/22/2020   AST 18 10/22/2020   NA 140 10/22/2020   K 3.5 10/22/2020   CL 102 10/22/2020   CREATININE 0.59 10/22/2020   BUN 15 10/22/2020   CO2 29 10/22/2020   TSH 3.00 10/22/2020   HGBA1C 5.3 10/22/2020    CT CARDIAC SCORING (SELF PAY ONLY)  Addendum Date: 05/27/2020   ADDENDUM REPORT: 05/27/2020 10:29 CLINICAL DATA:  Risk stratification EXAM: Coronary Calcium Score TECHNIQUE: The patient was scanned on a Siemens Somatom 64 slice scanner. Axial non-contrast 3 mm slices were carried out through the heart. The data set was analyzed on a dedicated work station and scored using the [place]. FINDINGS: Non-cardiac: See separate report from [facility]. Ascending aorta: Normal size Pericardium: Normal Coronary arteries: Normal origin IMPRESSION: Coronary calcium score of 0. This was 0 percentile for age and sex matched control. Kirk Ruths Electronically Signed   By: Kirk Ruths M.D.   On: 05/27/2020 10:29   Result Date: 05/27/2020 EXAM: OVER-READ INTERPRETATION  CT CHEST The following report is an over-read performed by radiologist Dr. Vinnie Langton of [facility], [place] on 05/27/2020. This over-read does not include interpretation of cardiac or coronary anatomy or pathology. The coronary calcium score interpretation by the cardiologist is attached. COMPARISON:  No priors.  FINDINGS: Within the visualized portions of the thorax there are no suspicious appearing pulmonary nodules or masses, there is no acute consolidative airspace disease, no pleural effusions, no pneumothorax and no lymphadenopathy. Visualized portions of the upper abdomen are unremarkable. There are no aggressive appearing lytic or blastic lesions noted in the visualized portions of the skeleton. IMPRESSION: No significant incidental noncardiac findings are noted. Electronically Signed: By: Vinnie Langton M.D. On: 05/27/2020 09:09     Assessment & Plan:  Plan    Meds ordered this encounter  Medications   linaclotide (LINZESS) 72 MCG capsule    Sig: Take 1 capsule (72 mcg total) by mouth daily before breakfast.    Dispense:  30 capsule    Refill:  3     Problem List Items Addressed This Visit     ESOPHAGEAL REFLUX    Avoid offending foods, start probiotics. Do not eat large meals in late evening and consider raising head of bed. Check H Pylori which she has had in the past.       Relevant Medications   linaclotide (LINZESS) 72 MCG capsule   Anemia   Relevant Orders   CBC with Differential/Platelet (Completed)   Testing of female for genetic disease carrier status    Patient reports her sister has been diagnosed with precancerous condition in her breast and she is requesting genetic testing as that is what her sister's doctor recommended. She also noted her PGM had either ovarian or uterine cancer she is unsure which one. Referred to Hem/onc for evalaution       Relevant Orders   Ambulatory referral to Hematology / Oncology   Sinusitis   Relevant Orders   Novel Coronavirus, NAA ([facility]) (Completed)   Hyperlipidemia, mild    Encourage heart healthy diet such as MIND or DASH diet, increase exercise, avoid trans fats, simple carbohydrates and processed foods, consider a krill or fish or flaxseed oil cap daily.        Relevant Orders   TSH (Completed)   Lipid panel (Completed)    Cough    Encouraged increased rest and hydration, add probiotics, zinc such as Coldeze or Xicam. Treat fevers as needed  Relevant Orders   QuantiFERON-TB Gold Plus (Completed)   Constipation    Encouraged increased hydration and fiber in diet. Daily probiotics. If bowels not moving can use MOM 2 tbls po in 4 oz of warm prune juice by mouth every 2-3 days. If no results then repeat in 4 hours with  Dulcolax suppository pr, may repeat again in 4 more hours as needed. Seek care if symptoms worsen. Consider daily Miralax and/or Dulcolax if symptoms persist. Is given a presciption for Linzess which she has done well with in the past.        Nonintractable headache   Relevant Orders   Novel Coronavirus, NAA ([facility]) (Completed)   Hyperglycemia    hgba1c acceptable, minimize simple carbs. Increase exercise as tolerated.        Relevant Orders   TSH (Completed)   Comprehensive metabolic panel (Completed)   Hemoglobin A1c (Completed)   Urinary frequency - Primary   Relevant Orders   POCT Urinalysis Dipstick (Automated) (Completed)   Urine Culture (Completed)   Chest pain   Relevant Orders   EKG 12-Lead (Completed)   Varicose veins of both lower extremities    Is wearing compression hose but requests a referral to vascular surgeon for consideration       Relevant Orders   Ambulatory referral to Vascular Surgery   Hemorrhoids    Intermittently symptomatic. Encouraged high fiber and fluid intake and daily Miralax. Consider referral to gastroenterolgy for treatment.        Relevant Orders   Ambulatory referral to Gastroenterology   Sore throat   Relevant Orders   Novel Coronavirus, NAA ([facility]) (Completed)   Other Visit Diagnoses     Encounter for hepatitis C screening test for low risk patient       Relevant Orders   Hepatitis C Antibody (Completed)   H. pylori infection       Relevant Orders   H. pylori breath test       Follow-up: Return in about 3 months  (around 01/22/2021), or f/u.  I, Suezanne Jacquet, acting as a scribe for Penni Homans, MD, have documented all relevent documentation on behalf of Penni Homans, MD, as directed by Penni Homans, MD while in the presence of Penni Homans, MD.  I, Mosie Lukes, MD personally performed the services described in this documentation. All medical record entries made by the scribe were at my direction and in my presence. I have reviewed the chart and agree that the record reflects my personal performance and is accurate and complete

## 2020-10-23 LAB — CBC WITH DIFFERENTIAL/PLATELET
Basophils Absolute: 0 10*3/uL (ref 0.0–0.1)
Basophils Relative: 0.8 % (ref 0.0–3.0)
Eosinophils Absolute: 0.1 10*3/uL (ref 0.0–0.7)
Eosinophils Relative: 2.1 % (ref 0.0–5.0)
HCT: 38.2 % (ref 36.0–46.0)
Hemoglobin: 13.3 g/dL (ref 12.0–15.0)
Lymphocytes Relative: 48.3 % — ABNORMAL HIGH (ref 12.0–46.0)
Lymphs Abs: 2.2 10*3/uL (ref 0.7–4.0)
MCHC: 34.8 g/dL (ref 30.0–36.0)
MCV: 99.4 fl (ref 78.0–100.0)
Monocytes Absolute: 0.4 10*3/uL (ref 0.1–1.0)
Monocytes Relative: 8.3 % (ref 3.0–12.0)
Neutro Abs: 1.9 10*3/uL (ref 1.4–7.7)
Neutrophils Relative %: 40.5 % — ABNORMAL LOW (ref 43.0–77.0)
Platelets: 273 10*3/uL (ref 150.0–400.0)
RBC: 3.84 Mil/uL — ABNORMAL LOW (ref 3.87–5.11)
RDW: 12.1 % (ref 11.5–15.5)
WBC: 4.6 10*3/uL (ref 4.0–10.5)

## 2020-10-23 LAB — COMPREHENSIVE METABOLIC PANEL
ALT: 13 U/L (ref 0–35)
AST: 18 U/L (ref 0–37)
Albumin: 4.7 g/dL (ref 3.5–5.2)
Alkaline Phosphatase: 90 U/L (ref 39–117)
BUN: 15 mg/dL (ref 6–23)
CO2: 29 mEq/L (ref 19–32)
Calcium: 9.7 mg/dL (ref 8.4–10.5)
Chloride: 102 mEq/L (ref 96–112)
Creatinine, Ser: 0.59 mg/dL (ref 0.40–1.20)
GFR: 101.4 mL/min (ref >60.00)
Glucose, Bld: 93 mg/dL (ref 70–99)
Potassium: 3.5 mEq/L (ref 3.5–5.1)
Sodium: 140 mEq/L (ref 135–145)
Total Bilirubin: 0.6 mg/dL (ref 0.2–1.2)
Total Protein: 7.8 g/dL (ref 6.0–8.3)

## 2020-10-23 LAB — HEMOGLOBIN A1C: Hgb A1c MFr Bld: 5.3 % (ref 4.6–6.5)

## 2020-10-23 LAB — TSH: TSH: 3 u[IU]/mL (ref 0.35–4.50)

## 2020-10-23 LAB — URINE CULTURE
MICRO NUMBER:: 12059431
Result:: NO GROWTH
SPECIMEN QUALITY:: ADEQUATE

## 2020-10-23 LAB — LIPID PANEL
Cholesterol: 222 mg/dL — ABNORMAL HIGH (ref 0–200)
HDL: 54.9 mg/dL (ref >39.00)
LDL Cholesterol: 139 mg/dL — ABNORMAL HIGH (ref 0–99)
NonHDL: 166.83
Total CHOL/HDL Ratio: 4
Triglycerides: 141 mg/dL (ref 0.0–149.0)
VLDL: 28.2 mg/dL (ref 0.0–40.0)

## 2020-10-23 LAB — NOVEL CORONAVIRUS, NAA: SARS-CoV-2, NAA: NOT DETECTED

## 2020-10-23 LAB — SARS-COV-2, NAA 2 DAY TAT

## 2020-10-24 ENCOUNTER — Other Ambulatory Visit: Payer: Self-pay | Admitting: Family Medicine

## 2020-10-24 ENCOUNTER — Encounter: Payer: Self-pay | Admitting: *Deleted

## 2020-10-24 ENCOUNTER — Telehealth: Payer: Self-pay

## 2020-10-24 LAB — QUANTIFERON-TB GOLD PLUS
Mitogen-NIL: >10 IU/mL
NIL: 0.02 IU/mL
QuantiFERON-TB Gold Plus: NEGATIVE
TB1-NIL: 0.01 IU/mL
TB2-NIL: 0.01 IU/mL

## 2020-10-24 LAB — HEPATITIS C ANTIBODY
Hepatitis C Ab: NONREACTIVE
SIGNAL TO CUT-OFF: 0.01 (ref <1.00)

## 2020-10-24 MED ORDER — DOXYCYCLINE HYCLATE 100 MG PO TABS: 100.0000 mg | ORAL_TABLET | Freq: Two times a day (BID) | ORAL | 0 refills | Status: DC

## 2020-10-24 NOTE — Telephone Encounter (Signed)
Patient notified

## 2020-10-24 NOTE — Telephone Encounter (Signed)
Pt states she is still having white discharge from nose , congestion and wants an antibiotic , negative covid .

## 2020-10-25 ENCOUNTER — Encounter: Payer: Self-pay | Admitting: *Deleted

## 2020-10-28 DIAGNOSIS — J029 Acute pharyngitis, unspecified: Secondary | ICD-10-CM | POA: Insufficient documentation

## 2020-10-28 DIAGNOSIS — R079 Chest pain, unspecified: Secondary | ICD-10-CM | POA: Insufficient documentation

## 2020-10-28 DIAGNOSIS — R350 Frequency of micturition: Secondary | ICD-10-CM | POA: Insufficient documentation

## 2020-10-28 DIAGNOSIS — K649 Unspecified hemorrhoids: Secondary | ICD-10-CM | POA: Insufficient documentation

## 2020-10-28 DIAGNOSIS — R0789 Other chest pain: Secondary | ICD-10-CM | POA: Insufficient documentation

## 2020-10-28 DIAGNOSIS — I8393 Asymptomatic varicose veins of bilateral lower extremities: Secondary | ICD-10-CM | POA: Insufficient documentation

## 2020-10-28 NOTE — Assessment & Plan Note (Signed)
Intermittently symptomatic. Encouraged high fiber and fluid intake and daily Miralax. Consider referral to gastroenterolgy for treatment.

## 2020-10-28 NOTE — Assessment & Plan Note (Signed)
Encourage heart healthy diet such as MIND or DASH diet, increase exercise, avoid trans fats, simple carbohydrates and processed foods, consider a krill or fish or flaxseed oil cap daily.  °

## 2020-10-28 NOTE — Assessment & Plan Note (Signed)
Encouraged increased rest and hydration, add probiotics, zinc such as Coldeze or Xicam. Treat fevers as needed 

## 2020-10-28 NOTE — Assessment & Plan Note (Addendum)
Avoid offending foods, start probiotics. Do not eat large meals in late evening and consider raising head of bed. Check H Pylori which she has had in the past.

## 2020-10-28 NOTE — Assessment & Plan Note (Signed)
hgba1c acceptable, minimize simple carbs. Increase exercise as tolerated.  

## 2020-10-28 NOTE — Assessment & Plan Note (Signed)
Patient reports her sister has been diagnosed with precancerous condition in her breast and she is requesting genetic testing as that is what her sister's doctor recommended. She also noted her PGM had either ovarian or uterine cancer she is unsure which one. Referred to Hem/onc for evalaution

## 2020-10-28 NOTE — Assessment & Plan Note (Signed)
Sis infrequent without any associated symptoms and is more likely related to GI or musculoskeletal concerns. EKG is unremarkable she will report if symptoms worsen or change and will seek care if symptoms are unremitting

## 2020-10-28 NOTE — Assessment & Plan Note (Signed)
Is wearing compression hose but requests a referral to vascular surgeon for consideration

## 2020-10-28 NOTE — Assessment & Plan Note (Signed)
Encouraged increased hydration and fiber in diet. Daily probiotics. If bowels not moving can use MOM 2 tbls po in 4 oz of warm prune juice by mouth every 2-3 days. If no results then repeat in 4 hours with  Dulcolax suppository pr, may repeat again in 4 more hours as needed. Seek care if symptoms worsen. Consider daily Miralax and/or Dulcolax if symptoms persist. Is given a presciption for Linzess which she has done well with in the past.

## 2020-11-04 ENCOUNTER — Telehealth: Payer: Self-pay | Admitting: Genetic Counselor

## 2020-11-04 NOTE — Telephone Encounter (Signed)
Received a genetic counseling referral from Dr. Charlett Blake for fhx of cancer. [name] has been cld and scheduled for a mychart video visit on 7/14 at 11am. I verified the pt has an active mychart acct.

## 2020-11-07 ENCOUNTER — Inpatient Hospital Stay: Payer: 59 | Attending: Genetic Counselor | Admitting: Genetic Counselor

## 2020-11-07 DIAGNOSIS — Z801 Family history of malignant neoplasm of trachea, bronchus and lung: Secondary | ICD-10-CM | POA: Diagnosis not present

## 2020-11-07 DIAGNOSIS — Z8041 Family history of malignant neoplasm of ovary: Secondary | ICD-10-CM

## 2020-11-07 DIAGNOSIS — Z803 Family history of malignant neoplasm of breast: Secondary | ICD-10-CM

## 2020-11-07 DIAGNOSIS — Z800 Family history of malignant neoplasm of digestive organs: Secondary | ICD-10-CM | POA: Diagnosis not present

## 2020-11-08 ENCOUNTER — Encounter: Payer: Self-pay | Admitting: Genetic Counselor

## 2020-11-08 NOTE — Progress Notes (Signed)
REFERRING PROVIDER: Mosie Lukes, MD [address] [place],  [place] [address]  PRIMARY PROVIDER:  Mosie Lukes, MD  PRIMARY REASON FOR VISIT:  1. Family history of breast cancer   2. Family history of ovarian cancer   3. Family history of colon cancer   4. Family history of lung cancer    I connected with [name] on 11/07/2020 at 11am EDT by [place] video conference and verified that I am speaking with the correct person using two identifiers.   Patient location: Home Provider location: [facility]  HISTORY OF PRESENT ILLNESS:   [name], a 55 y.o. female, was seen for a [place] cancer genetics consultation at the request of Dr. Charlett Blake due to a family history of cancer.  [name] presents to clinic today to discuss the possibility of a hereditary predisposition to cancer, to discuss genetic testing, and to further clarify her future cancer risks, as well as potential cancer risks for family members.   Ms. Yordy is a 55 y.o. female with no personal history of cancer.    CANCER HISTORY:  Oncology History   No history exists.    RISK FACTORS:  Menarche was at age 41 or 26.  First live birth at age 49.  OCP use for approximately 3 years.  Ovaries intact: yes.  Hysterectomy: no.  Menopausal status: postmenopausal.  HRT use: 0 years. Colonoscopy: yes;  most recent in March 2022, plans to f/u in 10 years . Mammogram within the last year: yes. Number of breast biopsies: 0. Up to date with pelvic exams: yes. Any excessive radiation exposure in the past: no  Past Medical History:  Diagnosis Date   Allergic rhinitis    Anemia    Anxiety    Asthma    Contact dermatitis and eczema    Diverticulitis    CT Scan   Esophageal reflux    H. pylori infection    Hyperlipidemia, mixed    IBS (irritable bowel syndrome)    IC (interstitial cystitis)    Internal hemorrhoids    Lumbago    Mitral valve disorders(424.0)    Osteoarthritis 07/08/2015    Post concussive syndrome    Seasonal and perennial allergic rhinitis 01/20/2015    Past Surgical History:  Procedure Laterality Date   BARTHOLIN CYST MARSUPIALIZATION N/A 12/18/2013   Procedure: BARTHOLIN CYST MARSUPIALIZATION WITH BIOPSY;  Surgeon: Lovenia Kim, MD;  Location: [facility];  Service: Gynecology;  Laterality: N/A;   CESAREAN SECTION     DILITATION & CURRETTAGE/HYSTROSCOPY WITH NOVASURE ABLATION N/A 10/14/2012   Procedure: DILATATION & CURETTAGE/HYSTEROSCOPY WITH NOVASURE ABLATION;  Surgeon: Lovenia Kim, MD;  Location: [facility];  Service: Gynecology;  Laterality: N/A;   MOUTH SURGERY     SINUS SURGERY WITH INSTATRAK      Social History   Socioeconomic History   Marital status: Married    Spouse name: Not on file   Number of children: 2   Years of education: Masters   Highest education level: Not on file  Occupational History   Occupation: Careers adviser  Tobacco Use   Smoking status: Never   Smokeless tobacco: Never  Vaping Use   Vaping Use: Never used  Substance and Sexual Activity   Alcohol use: Not Currently   Drug use: No   Sexual activity: Yes    Comment: lives with husband and son, work with [employer] as a Clinical research associate, aoivd gluten, dairy  Other Topics  Concern   Not on file  Social History Narrative   Lives at home home with husband and son.   Right-handed.   1 cup caffeine daily.   Social Determinants of Health   Financial Resource Strain: Not on file  Food Insecurity: Not on file  Transportation Needs: Not on file  Physical Activity: Not on file  Stress: Not on file  Social Connections: Not on file     FAMILY HISTORY:  We obtained a detailed, 4-generation family history.  Significant diagnoses are listed below: Family History  Problem Relation Age of Onset   Lung cancer Father        smoking hx; dx > 21   Leukemia Brother        acute; dx 94s   Colon cancer Paternal Uncle        dx unknown age   Cancer Paternal 36         breast/ovairan before age 79     Ms. Closser is unaware of previous family history of genetic testing for hereditary cancer risks. Patient's maternal ancestors are of [place] descent, and paternal ancestors are of [place] descent. She  reported possible Jewish ancestry in her maternal family. There is no known consanguinity.  GENETIC COUNSELING ASSESSMENT: Ms. Mick is a 55 y.o. female with a family history of cancer which is somewhat suggestive of a hereditary cancer syndrome and predisposition to cancer given the presence of cancer at a young age in her paternal grandmother. We, therefore, discussed and recommended the following at today's visit.   DISCUSSION: We discussed that 5 - 10% of cancer is hereditary, with most cases of hereditary breast and ovarian cancer associated with mutations in BRCA1/2.  There are other genes that can be associated with hereditary breast and ovarian cancer  syndromes.  Type of cancer risk and level of risk are gene-specific.  We discussed that testing is beneficial for several reasons, including knowing about other cancer risks, identifying potential screening and risk-reduction options that may be appropriate, and to understanding if other family members could be at risk for cancer and allowing them to undergo genetic testing.  We reviewed the characteristics, features and inheritance patterns of hereditary cancer syndromes. We also discussed genetic testing, including the appropriate family members to test, the process of testing, insurance coverage and turn-around-time for results. We discussed the implications of a negative, positive, carrier and/or variant of uncertain significant result. We discussed that negative results would be uninformative given that Ms. Duerksen does not have a personal history of cancer. We recommended Ms. Shappell pursue genetic testing for a panel that contains genes associated with breast, ovarian, and colon  cancer.  Ms. Shook was offered a common hereditary cancer panel (47 genes) and an expanded pan-cancer panel (84 genes). Ms. Yohe was informed of the benefits and limitations of each panel, including that expanded pan-cancer panels contain several genes that do not have clear management guidelines at this point in time.  We also discussed that as the number of genes included on a panel increases, the chances of variants of uncertain significance increases.  After considering the benefits and limitations of each gene panel, Ms. Player elected to have an expaned Radio broadcast assistant through [employer].  The Multi-Cancer Panel offered by [facility] includes sequencing and/or deletion duplication testing of the following 84 genes: AIP, ALK, APC, ATM, AXIN2,BAP1,  BARD1, BLM, BMPR1A, BRCA1, BRCA2, BRIP1, CASR, CDC73, CDH1, CDK4, CDKN1B, CDKN1C, CDKN2A (p14ARF), CDKN2A (p16INK4a), CEBPA, CHEK2, CTNNA1, DICER1, DIS3L2, EGFR (  c.2369C>T, p.Thr790Met variant only), EPCAM (Deletion/duplication testing only), FH, FLCN, GATA2, GPC3, GREM1 (Promoter region deletion/duplication testing only), HOXB13 (c.251G>A, p.Gly84Glu), HRAS, KIT, MAX, MEN1, MET, MITF (c.952G>A, p.Glu318Lys variant only), MLH1, MSH2, MSH3, MSH6, MUTYH, NBN, NF1, NF2, NTHL1, PALB2, PDGFRA, PHOX2B, PMS2, POLD1, POLE, POT1, PRKAR1A, PTCH1, PTEN, RAD50, RAD51C, RAD51D, RB1, RECQL4, RET, RUNX1, SDHAF2, SDHA (sequence changes only), SDHB, SDHC, SDHD, SMAD4, SMARCA4, SMARCB1, SMARCE1, STK11, SUFU, TERC, TERT, TMEM127, TP53, TSC1, TSC2, VHL, WRN and WT1.    Based on Ms. Javier's family history of breast/ovarian cancer, she meets medical criteria for genetic testing. Despite that she meets criteria, she may still have an out of pocket cost. We discussed that if her out of pocket cost for testing, the laboratory should reach out to discuss self-pay options or patient pay assistance programs.   We discussed that some people do not want to undergo genetic testing due  to fear of genetic discrimination.  A federal law called the Genetic Information Non-Discrimination Act (GINA) of 2008 helps protect individuals against genetic discrimination based on their genetic test results.  It impacts both health insurance and employment.  With health insurance, it protects against increased premiums, being kicked off insurance or being forced to take a test in order to be insured.  For employment it protects against hiring, firing and promoting decisions based on genetic test results.  GINA does not apply to those in the [employer], those who work for companies with less than 15 employees, and new life insurance or long-term disability insurance policies.  Health status due to a cancer diagnosis is not protected under GINA.  PLAN: After considering the risks, benefits, and limitations, Ms. Slight provided informed consent to pursue genetic testing.  A saliva kit was sent to her home address with directions to collect the sample and send to [facility] for analysis of the Multi-Cancer Panel. Results should be available within approximately 3 weeks of the sample being received by the laboratory, at which point they will be disclosed by telephone to Ms. Bhardwaj, as will any additional recommendations warranted by these results. Ms. Mckenny will receive a summary of her genetic counseling visit and a copy of her results once available. This information will also be available in Epic.   Lastly, we encouraged Ms. Herbison to remain in contact with cancer genetics annually so that we can continuously update the family history and inform her of any changes in cancer genetics and testing that may be of benefit for this family.   Ms. Bjorklund's questions were answered to her satisfaction today. Our contact information was provided should additional questions or concerns arise. Thank you for the referral and allowing [place] to share in the care of your patient.   Shyleigh Daughtry M. Joette Catching, [place],  [facility] Genetic Counselor [email or web address] (P) [phone]   The patient was seen for a total of 35 minutes in face-to-face genetic counseling.  The patient was seen alone.  Drs. Magrinat, Lindi Adie and/or Burr Medico were available to discuss this case as needed.  _______________________________________________________________________ For Office Staff:  Number of people involved in session: 1 Was an Intern/ student involved with case: no

## 2020-11-26 ENCOUNTER — Encounter: Payer: Self-pay | Admitting: Genetic Counselor

## 2020-11-26 ENCOUNTER — Telehealth: Payer: Self-pay | Admitting: Genetic Counselor

## 2020-11-26 DIAGNOSIS — Z0000 Encounter for general adult medical examination without abnormal findings: Secondary | ICD-10-CM | POA: Insufficient documentation

## 2020-11-26 DIAGNOSIS — Z1379 Encounter for other screening for genetic and chromosomal anomalies: Secondary | ICD-10-CM | POA: Insufficient documentation

## 2020-11-26 NOTE — Telephone Encounter (Signed)
Contacted patient in attempt to disclose results of genetic testing.  LVM with contact information requesting a call back.  

## 2020-11-28 ENCOUNTER — Telehealth: Payer: Self-pay | Admitting: Genetic Counselor

## 2020-11-28 NOTE — Telephone Encounter (Signed)
LM on VM that results are back and to please call. 

## 2020-11-29 ENCOUNTER — Telehealth: Payer: Self-pay

## 2020-11-29 NOTE — Telephone Encounter (Signed)
Pt called in states she had a genetic testing done and would like it reviewed. Pt states that it show some kidney concerns and would like a screen done.

## 2020-12-02 ENCOUNTER — Telehealth: Payer: Self-pay | Admitting: Genetic Counselor

## 2020-12-02 ENCOUNTER — Ambulatory Visit: Payer: Self-pay | Admitting: Genetic Counselor

## 2020-12-02 DIAGNOSIS — Z1379 Encounter for other screening for genetic and chromosomal anomalies: Secondary | ICD-10-CM

## 2020-12-02 DIAGNOSIS — Z8041 Family history of malignant neoplasm of ovary: Secondary | ICD-10-CM

## 2020-12-02 DIAGNOSIS — Z803 Family history of malignant neoplasm of breast: Secondary | ICD-10-CM

## 2020-12-02 DIAGNOSIS — Z800 Family history of malignant neoplasm of digestive organs: Secondary | ICD-10-CM

## 2020-12-02 NOTE — Telephone Encounter (Addendum)
Revealed negative genetic testing and variant of uncertain significance in FLCN.  Discussed that we do not know why there is cancer in the family. It could be sporadic/familial, due to a different gene that we are not testing, or maybe our current technology may not be able to pick something up.  It will be important for her to keep in contact with genetics to keep up with whether additional testing may be needed.

## 2020-12-02 NOTE — Telephone Encounter (Signed)
LVM to let her know it could wait until next visit in September

## 2020-12-02 NOTE — Progress Notes (Signed)
HPI:  [name] was previously seen in the [facility] clinic due to a family history of cancer and concerns regarding a hereditary predisposition to cancer. Please refer to our prior cancer genetics clinic note for more information regarding our discussion, assessment and recommendations, at the time. [name] recent genetic test results were disclosed to her, as were recommendations warranted by these results. These results and recommendations are discussed in more detail below.  CANCER HISTORY:  Oncology History   No history exists.    FAMILY HISTORY:  We obtained a detailed, 4-generation family history.  Significant diagnoses are listed below: Family History  Problem Relation Age of Onset   Lung cancer Father          smoking hx; dx > 18   Leukemia Brother          acute; dx 70s   Colon cancer Paternal Uncle          dx unknown age   Cancer Paternal 62          breast/ovairan before age 67       [name] is unaware of previous family history of genetic testing for hereditary cancer risks. Patient's maternal ancestors are of [place] descent, and paternal ancestors are of [place] descent. She  reported possible Jewish ancestry in her maternal family. There is no known consanguinity.    GENETIC TEST RESULTS: Genetic testing reported out on November 25, 2020.  The [facility] Multi-Cancer Panel cancer panel found no pathogenic mutations. The Multi-Cancer Panel offered by [facility] includes sequencing and/or deletion duplication testing of the following 84 genes: AIP, ALK, APC, ATM, AXIN2,BAP1,  BARD1, BLM, BMPR1A, BRCA1, BRCA2, BRIP1, CASR, CDC73, CDH1, CDK4, CDKN1B, CDKN1C, CDKN2A (p14ARF), CDKN2A (p16INK4a), CEBPA, CHEK2, CTNNA1, DICER1, DIS3L2, EGFR (c.2369C>T, p.Thr790Met variant only), EPCAM (Deletion/duplication testing only), FH, FLCN, GATA2, GPC3, GREM1 (Promoter region deletion/duplication testing only), HOXB13 (c.251G>A, p.Gly84Glu), HRAS, KIT,  MAX, MEN1, MET, MITF (c.952G>A, p.Glu318Lys variant only), MLH1, MSH2, MSH3, MSH6, MUTYH, NBN, NF1, NF2, NTHL1, PALB2, PDGFRA, PHOX2B, PMS2, POLD1, POLE, POT1, PRKAR1A, PTCH1, PTEN, RAD50, RAD51C, RAD51D, RB1, RECQL4, RET, RUNX1, SDHAF2, SDHA (sequence changes only), SDHB, SDHC, SDHD, SMAD4, SMARCA4, SMARCB1, SMARCE1, STK11, SUFU, TERC, TERT, TMEM127, TP53, TSC1, TSC2, VHL, WRN and WT1.   The test report has been scanned into EPIC and is located under the Molecular Pathology section of the Results Review tab.  A portion of the result report is included below for reference.     We discussed with [name] that because current genetic testing is not perfect, it is possible there may be a gene mutation in one of these genes that current testing cannot detect, but that chance is small.  We also discussed, that there could be another gene that has not yet been discovered, or that we have not yet tested, that is responsible for the cancer diagnoses in the family. It is also possible there is a hereditary cause for the cancer in the family that [name] did not inherit and therefore was not identified in her testing.  Therefore, it is important to remain in touch with cancer genetics in the future so that we can continue to offer [name] the most up to date genetic testing.   Genetic testing did identify a variant of uncertain significance (VUS) in the FLCN gene called c.644G>A (p.Cys215Tyr).  At this time, it is unknown if this variant is associated with increased cancer risk or if this is a normal finding,  but most variants such as this get reclassified to being inconsequential. It should not be used to make medical management decisions. With time, we suspect the lab will determine the significance of this variant, if any. If we do learn more about it, we will try to contact [name] to discuss it further. However, it is important to stay in touch with [place] periodically and keep the address and phone  number up to date.  ADDITIONAL GENETIC TESTING: We discussed with [name] that her genetic testing was fairly extensive.  If there are genes identified to increase cancer risk that can be analyzed in the future, we would be happy to discuss and coordinate this testing at that time.    CANCER SCREENING RECOMMENDATIONS: [name] test result is considered negative (normal).  This means that we have not identified a hereditary cause for her family history of cancer at this time.   While reassuring, this does not definitively Garcia out a hereditary predisposition to cancer. It is still possible that there could be genetic mutations that are undetectable by current technology. There could be genetic mutations in genes that have not been tested or identified to increase cancer risk.  Therefore, it is recommended she continue to follow the cancer management and screening guidelines provided by her primary healthcare provider.   An individual's cancer risk and medical management are not determined by genetic test results alone. Overall cancer risk assessment incorporates additional factors, including personal medical history, family history, and any available genetic information that may result in a personalized plan for cancer prevention and surveillance  RECOMMENDATIONS FOR FAMILY MEMBERS:  Individuals in this family might be at some increased risk of developing cancer, over the general population risk, simply due to the family history of cancer.  We recommended women in this family have a yearly mammogram beginning at age 60, or 84 years younger than the earliest onset of cancer, an annual clinical breast exam, and perform monthly breast self-exams. Women in this family should also have a gynecological exam as recommended by their primary provider. Family members should be referred for colonoscopy starting at age 85, or earlier, as recommended by their providers.    It is also possible there is a  hereditary cause for the cancer in [name] family that she did not inherit and therefore was not identified in her.  Based on [name] family history of breast/ovarian cancer in her paternal grandmother, we recommended her siblings have genetic counseling and testing. [name] will let us know if we can be of any assistance in coordinating genetic counseling and/or testing for this family member.   FOLLOW-UP: Lastly, we discussed with [name] that cancer genetics is a rapidly advancing field and it is possible that new genetic tests will be appropriate for her and/or her family members in the future. We encouraged her to remain in contact with cancer genetics on an annual basis so we can update her personal and family histories and let her know of advances in cancer genetics that may benefit this family.   Our contact number was provided. [name]'s questions were answered to her satisfaction, and she knows she is welcome to call us at anytime with additional questions or concerns.    Akaiya Touchette M. Joette Catching, [place], [facility] Genetic Counselor [email or web address] (P) [phone]

## 2021-01-02 ENCOUNTER — Telehealth (INDEPENDENT_AMBULATORY_CARE_PROVIDER_SITE_OTHER): Payer: 59 | Admitting: Family Medicine

## 2021-01-02 ENCOUNTER — Other Ambulatory Visit: Payer: Self-pay

## 2021-01-02 VITALS — BP 116/73 | HR 87 | Temp 97.6°F

## 2021-01-02 DIAGNOSIS — F32A Depression, unspecified: Secondary | ICD-10-CM

## 2021-01-02 DIAGNOSIS — R109 Unspecified abdominal pain: Secondary | ICD-10-CM | POA: Diagnosis not present

## 2021-01-02 DIAGNOSIS — N186 End stage renal disease: Secondary | ICD-10-CM

## 2021-01-02 DIAGNOSIS — M545 Low back pain, unspecified: Secondary | ICD-10-CM | POA: Diagnosis not present

## 2021-01-02 DIAGNOSIS — R519 Headache, unspecified: Secondary | ICD-10-CM

## 2021-01-02 DIAGNOSIS — D649 Anemia, unspecified: Secondary | ICD-10-CM | POA: Diagnosis not present

## 2021-01-02 DIAGNOSIS — J3089 Other allergic rhinitis: Secondary | ICD-10-CM

## 2021-01-02 DIAGNOSIS — E785 Hyperlipidemia, unspecified: Secondary | ICD-10-CM | POA: Diagnosis not present

## 2021-01-02 DIAGNOSIS — R059 Cough, unspecified: Secondary | ICD-10-CM

## 2021-01-02 DIAGNOSIS — J302 Other seasonal allergic rhinitis: Secondary | ICD-10-CM

## 2021-01-02 DIAGNOSIS — J4520 Mild intermittent asthma, uncomplicated: Secondary | ICD-10-CM

## 2021-01-02 DIAGNOSIS — H6090 Unspecified otitis externa, unspecified ear: Secondary | ICD-10-CM

## 2021-01-02 DIAGNOSIS — F419 Anxiety disorder, unspecified: Secondary | ICD-10-CM

## 2021-01-02 DIAGNOSIS — R739 Hyperglycemia, unspecified: Secondary | ICD-10-CM

## 2021-01-02 DIAGNOSIS — K219 Gastro-esophageal reflux disease without esophagitis: Secondary | ICD-10-CM

## 2021-01-02 DIAGNOSIS — M2550 Pain in unspecified joint: Secondary | ICD-10-CM

## 2021-01-02 MED ORDER — NEOMYCIN-POLYMYXIN-HC 3.5-10000-1 OT SOLN: 3.0000 [drp] | Freq: Three times a day (TID) | OTIC | 0 refills | Status: DC

## 2021-01-02 NOTE — Progress Notes (Signed)
MyChart Video Visit    Virtual Visit via Video Note   This visit type was conducted due to national recommendations for restrictions regarding the COVID-19 Pandemic (e.g. social distancing) in an effort to limit this patient's exposure and mitigate transmission in our community. This patient is at least at moderate risk for complications without adequate follow up. This format is felt to be most appropriate for this patient at this time. Physical exam was limited by quality of the video and audio technology used for the visit. S. Chism, CMA was able to get the patient set up on a video visit.  Patient location: Home Patient and provider in visit Provider location: Office  I discussed the limitations of evaluation and management by telemedicine and the availability of in person appointments. The patient expressed understanding and agreed to proceed.  Visit Date: 01/07/21  Today's healthcare provider: Penni Homans, MD     Subjective:    Patient ID: [name], female    DOB: [birth year], 55 y.o.   MRN: [id number]  No chief complaint on file.   HPI Patient is in today for video visit for follow up on HA's and hemorrhoids. She is still getting HA's and is thinking about taking early retirement. Confusion and trouble with finding words are symptoms that accompany her HA's. Denies CP/palp/SOB/HA/congestion/fevers or GU c/o. Taking meds as prescribed  Woke up few nights ago to loud doorbell ringing and nobody was there. Sound she states was in her head. She was cleaning out her right ear with q-tip, but now her right ear hurts constantly. Denies any itching or discharge of ear. Her hemorrhoids have improved as she has been incorporating more fiber into her diet and is staying well hydrated. She notes still getting pebbles but no black or sticky stool.  She is still getting episodes of fatigue and hot flashes with sweating due to menopause. She experiences lateral side  She is  worried that she might have Birt-Hogg-Dub Syndrome and would like to get genetic testing. She is experiencing symptoms of cloudy urine, abdominal pain, and back pain.  Lateral side of neck felt electrical sensation that comes and goes. She also has right hip pain.   Past Medical History:  Diagnosis Date   Allergic rhinitis    Anemia    Anxiety    Asthma    Contact dermatitis and eczema    Diverticulitis    CT Scan   Esophageal reflux    H. pylori infection    Hyperlipidemia, mixed    IBS (irritable bowel syndrome)    IC (interstitial cystitis)    Internal hemorrhoids    Lumbago    Mitral valve disorders(424.0)    Osteoarthritis 07/08/2015   Post concussive syndrome    Seasonal and perennial allergic rhinitis 01/20/2015    Past Surgical History:  Procedure Laterality Date   BARTHOLIN CYST MARSUPIALIZATION N/A 12/18/2013   Procedure: BARTHOLIN CYST MARSUPIALIZATION WITH BIOPSY;  Surgeon: Lovenia Kim, MD;  Location: [facility];  Service: Gynecology;  Laterality: N/A;   CESAREAN SECTION     DILITATION & CURRETTAGE/HYSTROSCOPY WITH NOVASURE ABLATION N/A 10/14/2012   Procedure: DILATATION & CURETTAGE/HYSTEROSCOPY WITH NOVASURE ABLATION;  Surgeon: Lovenia Kim, MD;  Location: [facility];  Service: Gynecology;  Laterality: N/A;   MOUTH SURGERY     SINUS SURGERY WITH INSTATRAK      Family History  Problem Relation Age of Onset   Diabetes Mother    Hypertension Mother    Heart attack  Mother        MI at age 5   Alcohol abuse Mother        smoker   Heart disease Mother    Dementia Mother    Hypertension Father    Hyperlipidemia Father    Heart attack Father    Lung cancer Father        smoking hx; dx > 32   Colon polyps Sister    Mental illness Sister        anxiety from 9/11 survivors   Varicose Veins Sister    Osteoporosis Sister    Other Sister        hypoglycemia   Varicose Veins Sister    Rheum arthritis Sister    Pleurisy Sister    Fibromyalgia Sister     Mental retardation Sister        depression   Leukemia Brother        acute; dx 38s   Colon cancer Paternal Uncle        dx unknown age   Dementia Maternal Grandmother    Heart disease Maternal Grandfather        mi   Cancer Paternal Grandmother        breast/ovairan before age 83   GER disease Son    Alcohol abuse Other        Family history   Breast cancer Neg Hx     Social History   Socioeconomic History   Marital status: Married    Spouse name: Not on file   Number of children: 2   Years of education: Masters   Highest education level: Not on file  Occupational History   Occupation: Careers adviser  Tobacco Use   Smoking status: Never   Smokeless tobacco: Never  Vaping Use   Vaping Use: Never used  Substance and Sexual Activity   Alcohol use: Not Currently   Drug use: No   Sexual activity: Yes    Comment: lives with husband and son, 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   Right-handed.   1 cup caffeine daily.   Social Determinants of Health   Financial Resource Strain: Not on file  Food Insecurity: Not on file  Transportation Needs: Not on file  Physical Activity: Not on file  Stress: Not on file  Social Connections: Not on file  Intimate Partner Violence: Not on file    Outpatient Medications Prior to Visit  Medication Sig Dispense Refill   Multiple Vitamins-Minerals (MULTIVITAMIN ADULT PO) Take 1 tablet by mouth daily.     OVER THE COUNTER MEDICATION Calcium     OVER THE COUNTER MEDICATION Omega 3 2100     Probiotic Product (PROBIOTIC DAILY PO) Take 1 tablet by mouth daily.     sodium chloride (OCEAN) 0.65 % SOLN nasal spray Place 1 spray into both nostrils as needed for congestion.     doxycycline (VIBRA-TABS) 100 MG tablet Take 1 tablet (100 mg total) by mouth 2 (two) times daily. 14 tablet 0   linaclotide (LINZESS)  72 MCG capsule Take 1 capsule (72 mcg total) by mouth daily before breakfast. 30 capsule 3   sertraline (ZOLOFT) 25 MG tablet Take 0.5-1 tablets (12.5-25 mg total) by mouth daily. 30 tablet 3   No facility-administered medications prior to visit.    Allergies  Allergen  Reactions   Amoxicillin Shortness Of Breath    Doesn't remember rxn   Aspirin Shortness Of Breath    "irritated stomach" Ibuprofen and Naproxen do not agree with her either   Codeine Hives, Shortness Of Breath and Other (See Comments)    Does not take percocet or hydrocodone; tylenol only   Latex Shortness Of Breath, Itching and Other (See Comments)   Other Shortness Of Breath and Other (See Comments)    peanuts   Sulfa Antibiotics Hives and Shortness Of Breath   Sulfamethoxazole-Trimethoprim Shortness Of Breath   Sulfonamide Derivatives Shortness Of Breath and Itching   Sulphadimidine Sodium [Sulfamethazine Sodium] Shortness Of Breath    Added to food for preservative   Ciprofloxacin Other (See Comments)    REACTION: SOB, "Tightness in head" REACTION: SOB, "Tightness in head" Syncope    Azithromycin Other (See Comments)    Faintness   Carisoprodol    Clarithromycin     "Doesn't agree with me"   Epinephrine     Heart racing   Fenofibrate Other (See Comments)    Leg cramps, body aches    Metronidazole Other (See Comments)    "cannot tolerate"   Nsaids    Penicillins     Doesn't remember reaction   Tolmetin    Omeprazole Palpitations    Review of Systems  Constitutional:  Positive for malaise/fatigue. Negative for chills and fever.  HENT:  Positive for ear pain. Negative for congestion, ear discharge, hearing loss, sinus pain, sore throat and tinnitus.   Eyes:  Negative for blurred vision.  Respiratory:  Negative for cough and shortness of breath.   Cardiovascular:  Negative for chest pain, palpitations and leg swelling.  Gastrointestinal:  Positive for abdominal pain. Negative for blood in stool,  diarrhea, nausea and vomiting.  Genitourinary:  Negative for flank pain and frequency.       (+) cloudy urine  Musculoskeletal:  Positive for back pain and myalgias.  Skin:  Negative for rash.  Neurological:  Negative for headaches.      Objective:    Physical Exam Constitutional:      General: She is not in acute distress.    Appearance: Normal appearance. She is not ill-appearing or toxic-appearing.  HENT:     Head: Normocephalic and atraumatic.     Right Ear: External ear normal.     Left Ear: External ear normal.     Nose: Nose normal.  Eyes:     General:        Right eye: No discharge.        Left eye: No discharge.  Pulmonary:     Effort: Pulmonary effort is normal.  Skin:    Findings: No rash.  Neurological:     Mental Status: She is alert and oriented to person, place, and time.  Psychiatric:        Behavior: Behavior normal.    BP 116/73   Pulse 87   Temp 97.6 F (36.4 C)   LMP 06/30/2016  Wt Readings from Last 3 Encounters:  10/22/20 102 lb (46.3 kg)  07/31/20 108 lb 3.2 oz (49.1 kg)  05/22/20 104 lb 1.9 oz (47.2 kg)    Diabetic Foot Exam - Simple   No data filed    Lab Results  Component Value Date   WBC 4.6 10/22/2020   HGB 13.3 10/22/2020   HCT 38.2 10/22/2020   PLT 273.0 10/22/2020   GLUCOSE 93 10/22/2020   CHOL 222 (H) 10/22/2020   TRIG  141.0 10/22/2020   HDL 54.90 10/22/2020   LDLDIRECT 143.0 01/17/2019   LDLCALC 139 (H) 10/22/2020   ALT 13 10/22/2020   AST 18 10/22/2020   NA 140 10/22/2020   K 3.5 10/22/2020   CL 102 10/22/2020   CREATININE 0.59 10/22/2020   BUN 15 10/22/2020   CO2 29 10/22/2020   TSH 3.00 10/22/2020   HGBA1C 5.3 10/22/2020    Lab Results  Component Value Date   TSH 3.00 10/22/2020   Lab Results  Component Value Date   WBC 4.6 10/22/2020   HGB 13.3 10/22/2020   HCT 38.2 10/22/2020   MCV 99.4 10/22/2020   PLT 273.0 10/22/2020   Lab Results  Component Value Date   NA 140 10/22/2020   K 3.5  10/22/2020   CO2 29 10/22/2020   GLUCOSE 93 10/22/2020   BUN 15 10/22/2020   CREATININE 0.59 10/22/2020   BILITOT 0.6 10/22/2020   ALKPHOS 90 10/22/2020   AST 18 10/22/2020   ALT 13 10/22/2020   PROT 7.8 10/22/2020   ALBUMIN 4.7 10/22/2020   CALCIUM 9.7 10/22/2020   ANIONGAP 10 01/09/2019   GFR 101.40 10/22/2020   Lab Results  Component Value Date   CHOL 222 (H) 10/22/2020   Lab Results  Component Value Date   HDL 54.90 10/22/2020   Lab Results  Component Value Date   LDLCALC 139 (H) 10/22/2020   Lab Results  Component Value Date   TRIG 141.0 10/22/2020   Lab Results  Component Value Date   CHOLHDL 4 10/22/2020   Lab Results  Component Value Date   HGBA1C 5.3 10/22/2020       Assessment & Plan:   Problem List Items Addressed This Visit     Allergic asthma, mild intermittent, uncomplicated   ESOPHAGEAL REFLUX    Avoid offending foods, start probiotics. Do not eat large meals in late evening and consider raising head of bed.       LOW BACK PAIN - Primary    Encouraged moist heat and gentle stretching as tolerated. May try NSAIDs and prescription meds as directed and report if symptoms worsen or seek immediate care      Anemia    Increase leafy greens, consider increased lean red meat and using cast iron cookware. Continue to monitor, report any concerns      Arthralgia   Relevant Orders   Antinuclear Antib (ANA)   Hyperlipidemia, mild   Relevant Orders   Comprehensive metabolic panel   Lipid panel   Seasonal and perennial allergic rhinitis   Cough   Relevant Orders   DG Chest 2 View   Anxiety and depression    Continues to function at work despite the high stress environs. No changes      Nonintractable headache   Hyperglycemia    hgba1c acceptable, minimize simple carbs. Increase exercise as tolerated.       Relevant Orders   Comprehensive metabolic panel   TSH   Hemoglobin A1c   renal insufficiency    Hydrate and monitor, check renal  ultrasound      Relevant Orders   VAS US RENAL ARTERY DUPLEX   Otitis externa    Cortisporin otic      Other Visit Diagnoses     Abdominal pain, unspecified abdominal location       Relevant Orders   CBC         Meds ordered this encounter  Medications   neomycin-polymyxin-hydrocortisone (CORTISPORIN) OTIC solution  Sig: Place 3 drops into the right ear 3 (three) times daily.    Dispense:  10 mL    Refill:  0    I discussed the assessment and treatment plan with the patient. The patient was provided an opportunity to ask questions and all were answered. The patient agreed with the plan and demonstrated an understanding of the instructions.   The patient was advised to call back or seek an in-person evaluation if the symptoms worsen or if the condition fails to improve as anticipated.  I provided 37 minutes of face-to-face time during this encounter.   Penni Homans, MD [facility] [phone] (phone) [phone] (fax)  [place], Suezanne Jacquet, acting as a scribe for Penni Homans, MD, have documented all relevent documentation on behalf of Penni Homans, MD, as directed by Penni Homans, MD while in the presence of Penni Homans, MD. DO:01/07/21.  27 minutes

## 2021-01-05 NOTE — Assessment & Plan Note (Signed)
Avoid offending foods, start probiotics. Do not eat large meals in late evening and consider raising head of bed.  

## 2021-01-05 NOTE — Assessment & Plan Note (Signed)
hgba1c acceptable, minimize simple carbs. Increase exercise as tolerated.  

## 2021-01-05 NOTE — Assessment & Plan Note (Signed)
Continues to function at work despite the high stress environs. No changes

## 2021-01-05 NOTE — Assessment & Plan Note (Signed)
Increase leafy greens, consider increased lean red meat and using cast iron cookware. Continue to monitor, report any concerns 

## 2021-01-07 DIAGNOSIS — H6090 Unspecified otitis externa, unspecified ear: Secondary | ICD-10-CM | POA: Insufficient documentation

## 2021-01-07 DIAGNOSIS — N186 End stage renal disease: Secondary | ICD-10-CM | POA: Insufficient documentation

## 2021-01-07 NOTE — Assessment & Plan Note (Signed)
Cortisporin otic

## 2021-01-07 NOTE — Assessment & Plan Note (Addendum)
Hydrate and monitor, check renal ultrasound

## 2021-01-07 NOTE — Assessment & Plan Note (Signed)
Encouraged moist heat and gentle stretching as tolerated. May try NSAIDs and prescription meds as directed and report if symptoms worsen or seek immediate care 

## 2021-01-09 ENCOUNTER — Ambulatory Visit (HOSPITAL_BASED_OUTPATIENT_CLINIC_OR_DEPARTMENT_OTHER)
Admission: RE | Admit: 2021-01-09 | Discharge: 2021-01-09 | Disposition: A | Discharge status: Home / Self Care | Payer: 59 | Source: Ambulatory Visit | Attending: Family Medicine | Admitting: Family Medicine

## 2021-01-09 ENCOUNTER — Other Ambulatory Visit: Payer: Self-pay

## 2021-01-09 DIAGNOSIS — R059 Cough, unspecified: Secondary | ICD-10-CM | POA: Diagnosis not present

## 2021-01-10 ENCOUNTER — Ambulatory Visit: Payer: 59 | Admitting: Medical

## 2021-01-10 ENCOUNTER — Other Ambulatory Visit (INDEPENDENT_AMBULATORY_CARE_PROVIDER_SITE_OTHER): Payer: 59

## 2021-01-10 ENCOUNTER — Other Ambulatory Visit: Payer: 59

## 2021-01-10 DIAGNOSIS — R739 Hyperglycemia, unspecified: Secondary | ICD-10-CM | POA: Diagnosis not present

## 2021-01-10 DIAGNOSIS — E785 Hyperlipidemia, unspecified: Secondary | ICD-10-CM

## 2021-01-10 DIAGNOSIS — R109 Unspecified abdominal pain: Secondary | ICD-10-CM | POA: Diagnosis not present

## 2021-01-10 DIAGNOSIS — M2550 Pain in unspecified joint: Secondary | ICD-10-CM

## 2021-01-10 LAB — COMPREHENSIVE METABOLIC PANEL
ALT: 14 U/L (ref 0–35)
AST: 18 U/L (ref 0–37)
Albumin: 4.3 g/dL (ref 3.5–5.2)
Alkaline Phosphatase: 93 U/L (ref 39–117)
BUN: 13 mg/dL (ref 6–23)
CO2: 30 mEq/L (ref 19–32)
Calcium: 9.7 mg/dL (ref 8.4–10.5)
Chloride: 102 mEq/L (ref 96–112)
Creatinine, Ser: 0.61 mg/dL (ref 0.40–1.20)
GFR: 100.43 mL/min (ref >60.00)
Glucose, Bld: 86 mg/dL (ref 70–99)
Potassium: 4.6 mEq/L (ref 3.5–5.1)
Sodium: 139 mEq/L (ref 135–145)
Total Bilirubin: 1.2 mg/dL (ref 0.2–1.2)
Total Protein: 7 g/dL (ref 6.0–8.3)

## 2021-01-10 LAB — LIPID PANEL
Cholesterol: 198 mg/dL (ref 0–200)
HDL: 48.1 mg/dL (ref >39.00)
NonHDL: 149.45
Total CHOL/HDL Ratio: 4
Triglycerides: 225 mg/dL — ABNORMAL HIGH (ref 0.0–149.0)
VLDL: 45 mg/dL — ABNORMAL HIGH (ref 0.0–40.0)

## 2021-01-10 LAB — CBC
HCT: 39.7 % (ref 36.0–46.0)
Hemoglobin: 13.7 g/dL (ref 12.0–15.0)
MCHC: 34.4 g/dL (ref 30.0–36.0)
MCV: 99 fl (ref 78.0–100.0)
Platelets: 277 10*3/uL (ref 150.0–400.0)
RBC: 4.01 Mil/uL (ref 3.87–5.11)
RDW: 12.2 % (ref 11.5–15.5)
WBC: 3.7 10*3/uL — ABNORMAL LOW (ref 4.0–10.5)

## 2021-01-10 LAB — HEMOGLOBIN A1C: Hgb A1c MFr Bld: 5.2 % (ref 4.6–6.5)

## 2021-01-10 LAB — TSH: TSH: 1.44 u[IU]/mL (ref 0.35–5.50)

## 2021-01-10 LAB — LDL CHOLESTEROL, DIRECT: Direct LDL: 130 mg/dL

## 2021-01-10 NOTE — Addendum Note (Signed)
Addended by: Kelle Darting A on: 01/10/2021 10:23 AM   Modules accepted: Orders

## 2021-01-14 LAB — ANA: Anti Nuclear Antibody (ANA): NEGATIVE

## 2021-01-17 ENCOUNTER — Other Ambulatory Visit: Payer: Self-pay

## 2021-01-17 ENCOUNTER — Encounter (INDEPENDENT_AMBULATORY_CARE_PROVIDER_SITE_OTHER): Payer: 59

## 2021-01-17 ENCOUNTER — Encounter (HOSPITAL_BASED_OUTPATIENT_CLINIC_OR_DEPARTMENT_OTHER): Payer: Self-pay

## 2021-01-17 ENCOUNTER — Encounter (HOSPITAL_BASED_OUTPATIENT_CLINIC_OR_DEPARTMENT_OTHER): Payer: 59

## 2021-01-17 DIAGNOSIS — N186 End stage renal disease: Secondary | ICD-10-CM | POA: Diagnosis not present

## 2021-01-21 ENCOUNTER — Telehealth: Payer: Self-pay

## 2021-01-21 NOTE — Telephone Encounter (Signed)
Pt called stating she does not want to go back to [facility] for the veinous duplex insufficient scan, follow up untrasound  She is requesting that imaging order be placed at the [place] location.

## 2021-01-24 NOTE — Telephone Encounter (Signed)
Are you ok with this? 

## 2021-01-27 NOTE — Telephone Encounter (Signed)
Spoke with patient and she was seen by [facility] on 01/16/21 and she waited for 2 hours and she does not want to go back to them.  They ordered that test.  After reviewing chart it looks like we did a referral because patient wanted a second opinion after she was Vascular Vein Specialist.    Here is the assessment/plan from the vein specialist from 01/16/21 Assessment/Plan 1. Varicose veins of both lower extremities, unspecified whether complicated  - Ambulatory referral to Vascular Surgery  2. Varicose veins of bilateral lower extremities with pain  - CLIN VAS LAB-VENOUS DUPLEX-LOWER EXTREMITY(INSUFFICIENCY) BILATERAL; Future

## 2021-01-28 ENCOUNTER — Other Ambulatory Visit: Payer: Self-pay | Admitting: Family Medicine

## 2021-01-28 DIAGNOSIS — I739 Peripheral vascular disease, unspecified: Secondary | ICD-10-CM

## 2021-01-28 NOTE — Telephone Encounter (Signed)
Advised of note below.  She stated that the doctor at [facility] felt something with one her veins, "it felt bubbly".  So she would like the doppler done here.

## 2021-01-29 NOTE — Telephone Encounter (Signed)
Patient notified that order has been placed.

## 2021-02-05 ENCOUNTER — Ambulatory Visit (HOSPITAL_BASED_OUTPATIENT_CLINIC_OR_DEPARTMENT_OTHER)
Admission: RE | Admit: 2021-02-05 | Discharge: 2021-02-05 | Disposition: A | Discharge status: Home / Self Care | Payer: 59 | Source: Ambulatory Visit | Attending: Family Medicine | Admitting: Family Medicine

## 2021-02-05 ENCOUNTER — Other Ambulatory Visit: Payer: Self-pay

## 2021-02-05 DIAGNOSIS — I739 Peripheral vascular disease, unspecified: Secondary | ICD-10-CM | POA: Insufficient documentation

## 2021-02-06 ENCOUNTER — Telehealth: Payer: Self-pay | Admitting: Family Medicine

## 2021-02-06 ENCOUNTER — Other Ambulatory Visit (HOSPITAL_BASED_OUTPATIENT_CLINIC_OR_DEPARTMENT_OTHER): Payer: Self-pay

## 2021-02-06 MED ORDER — FLUARIX QUADRIVALENT 0.5 ML IM SUSY
PREFILLED_SYRINGE | INTRAMUSCULAR | 0 refills | Status: DC
  Filled 2021-02-06: qty 0.5, 1d supply, fill #0

## 2021-02-06 NOTE — Telephone Encounter (Signed)
Pt stated when she received the last Covid booster she had the Covid arm (hot to touch and swollen). She was advised to see if booster could be given low doses little by little. She contacted [facility]ergy [facility] Ast[facility] how ever first appointment is late November/December. Pt stated she could not wait that long working in [employer]. Please advise.  Pt also would like to inform [facility] Post did a stool test- negative

## 2021-02-07 NOTE — Telephone Encounter (Signed)
Pt aware.

## 2021-02-28 ENCOUNTER — Ambulatory Visit: Payer: 59 | Admitting: Family

## 2021-03-28 ENCOUNTER — Other Ambulatory Visit: Payer: Self-pay | Admitting: Obstetrics and Gynecology

## 2021-03-28 DIAGNOSIS — Z1231 Encounter for screening mammogram for malignant neoplasm of breast: Secondary | ICD-10-CM

## 2021-04-01 NOTE — Progress Notes (Signed)
Virtual Visit via Video Note   This visit type was conducted due to national recommendations for restrictions regarding the COVID-19 Pandemic (e.g. social distancing) in an effort to limit this patient's exposure and mitigate transmission in our community.  Due to her co-morbid illnesses, this patient is at least at moderate risk for complications without adequate follow up.  This format is felt to be most appropriate for this patient at this time.  All issues noted in this document were discussed and addressed.  A limited physical exam was performed with this format.  Please refer to the patient's chart for her consent to telehealth for [facility].      Date:  04/08/2021   ID:  [name], DOB 04/18/1966, MRN [id number]  Patient Location:Home Provider Location: Home  PCP:  Mosie Lukes, MD  Cardiologist:  Dr Stanford Breed  Evaluation Performed:  Follow-Up Visit  Chief Complaint:  FU syncope  History of Present Illness:    FU CP and syncope.  ABIs May 2017 normal.  Monitor October 2017 showed sinus rhythm.  Also seen for syncopal episode in October 2017.  MRI negative.  EEG normal.  Seen by neurology and felt to have postconcussive syndrome.  Exercise treadmill October 2018 was negative.  Echocardiogram March 2019 showed normal LV systolic function.  Calcium score January 2022 0.  Renal Dopplers September 2022 showed no renal artery stenosis.  Lower extremity venous Dopplers October 2022 showed no DVT.  Patient recently contacted the office with "dizzy spells".  She was added to my schedule.  Since last seen patient states she recently had her COVID-vaccine.  The following day she felt lightheaded.  She stood and went downstairs and was in the kitchen and her symptoms worsen.  She then became nauseated.  She felt as though she may pass out and she laid down on the floor with her feet up and her symptoms resolved.  She did not have frank syncope.  No preceding palpitations, dyspnea or  chest pain.  She otherwise denies dyspnea on exertion, orthopnea, PND, pedal edema, exertional chest pain.  The patient does not have symptoms concerning for COVID-19 infection (fever, chills, cough, or new shortness of breath).    Past Medical History:  Diagnosis Date   Allergic rhinitis    Anemia    Anxiety    Asthma    Contact dermatitis and eczema    Diverticulitis    CT Scan   Esophageal reflux    H. pylori infection    Hyperlipidemia, mixed    IBS (irritable bowel syndrome)    IC (interstitial cystitis)    Internal hemorrhoids    Lumbago    Mitral valve disorders(424.0)    Osteoarthritis 07/08/2015   Post concussive syndrome    Seasonal and perennial allergic rhinitis 01/20/2015   Past Surgical History:  Procedure Laterality Date   BARTHOLIN CYST MARSUPIALIZATION N/A 12/18/2013   Procedure: BARTHOLIN CYST MARSUPIALIZATION WITH BIOPSY;  Surgeon: Lovenia Kim, MD;  Location: [facility];  Service: Gynecology;  Laterality: N/A;   CESAREAN SECTION     DILITATION & CURRETTAGE/HYSTROSCOPY WITH NOVASURE ABLATION N/A 10/14/2012   Procedure: DILATATION & CURETTAGE/HYSTEROSCOPY WITH NOVASURE ABLATION;  Surgeon: Lovenia Kim, MD;  Location: [facility];  Service: Gynecology;  Laterality: N/A;   MOUTH SURGERY     SINUS SURGERY WITH INSTATRAK       Current Meds  Medication Sig   Multiple Vitamins-Minerals (MULTIVITAMIN ADULT PO) Take 1 tablet by mouth daily.   OVER  THE COUNTER MEDICATION Calcium   OVER THE COUNTER MEDICATION Omega 3 2100   Probiotic Product (PROBIOTIC DAILY PO) Take 1 tablet by mouth daily.   sodium chloride (OCEAN) 0.65 % SOLN nasal spray Place 1 spray into both nostrils as needed for congestion.     Allergies:   Amoxicillin, Aspirin, Codeine, Latex, Other, Sulfa antibiotics, Sulfamethoxazole-trimethoprim, Sulfonamide derivatives, Sulphadimidine sodium [sulfamethazine sodium], Ciprofloxacin, Azithromycin, Carisoprodol, Clarithromycin, Epinephrine, Fenofibrate,  Metronidazole, Nsaids, Penicillins, Tolmetin, and Omeprazole   Social History   Tobacco Use   Smoking status: Never   Smokeless tobacco: Never  Vaping Use   Vaping Use: Never used  Substance Use Topics   Alcohol use: Not Currently   Drug use: No     Family Hx: The patient's family history includes Alcohol abuse in her mother and another family member; Cancer in her paternal grandmother; Colon cancer in her paternal uncle; Colon polyps in her sister; Dementia in her maternal grandmother and mother; Diabetes in her mother; Fibromyalgia in her sister; GER disease in her son; Heart attack in her father and mother; Heart disease in her maternal grandfather and mother; Hyperlipidemia in her father; Hypertension in her father and mother; Leukemia in her brother; Lung cancer in her father; Mental illness in her sister; Mental retardation in her sister; Osteoporosis in her sister; Other in her sister; Pleurisy in her sister; Rheum arthritis in her sister; Varicose Veins in her sister and sister. There is no history of Breast cancer.  ROS:   Please see the history of present illness.    No Fever, chills  or productive cough All other systems reviewed and are negative.  Recent Labs: 01/10/2021: ALT 14; BUN 13; Creatinine, Ser 0.61; Hemoglobin 13.7; Platelets 277.0; Potassium 4.6; Sodium 139; TSH 1.44   Recent Lipid Panel Lab Results  Component Value Date/Time   CHOL 198 01/10/2021 10:23 AM   TRIG 225.0 (H) 01/10/2021 10:23 AM   HDL 48.10 01/10/2021 10:23 AM   CHOLHDL 4 01/10/2021 10:23 AM   LDLCALC 139 (H) 10/22/2020 05:16 PM   LDLDIRECT 130.0 01/10/2021 10:23 AM    Wt Readings from Last 3 Encounters:  04/08/21 100 lb (45.4 kg)  10/22/20 102 lb (46.3 kg)  07/31/20 108 lb 3.2 oz (49.1 kg)     Objective:    Vital Signs:  BP 104/73   Pulse 90   Ht 4\' 11"  (1.499 m)   Wt 100 lb (45.4 kg)   LMP 06/30/2016   BMI 20.20 kg/m    VITAL SIGNS:  reviewed NAD Answers questions  appropriately Normal affect Remainder of physical examination not performed (telehealth visit; coronavirus pandemic)  ASSESSMENT & PLAN:    Dizzy spell-likely orthostatic mediated with possible contribution from increased vagal tone.  She did not have frank syncope.  I have discussed with her the need to stay hydrated and increase sodium intake.  We will consider further evaluation if she has additional symptoms in the future. History of syncope-previous cardiac evaluation unrevealing.  Episodes felt likely vagal in etiology. Hyperlipidemia-managed by primary care.  Note previous calcium score 0. Question history of mitral valve prolapse-not evident on most recent echocardiogram. Chest pain-no recent exertional chest pain.  Previous calcium score normal.  COVID-19 Education: The importance of social distancing was discussed today.  Time:   Today, I have spent 16 minutes with the patient with telehealth technology discussing the above problems.     Medication Adjustments/Labs and Tests Ordered: Current medicines are reviewed at length with the patient today.  Concerns regarding medicines are outlined above.   Tests Ordered: No orders of the defined types were placed in this encounter.   Medication Changes: No orders of the defined types were placed in this encounter.   Follow Up:  In Person in 6 month(s)  Signed, Kirk Ruths, MD  04/08/2021 7:58 AM    [place]

## 2021-04-08 ENCOUNTER — Encounter: Payer: Self-pay | Admitting: Cardiology

## 2021-04-08 ENCOUNTER — Telehealth (INDEPENDENT_AMBULATORY_CARE_PROVIDER_SITE_OTHER): Payer: 59 | Admitting: Cardiology

## 2021-04-08 ENCOUNTER — Other Ambulatory Visit: Payer: Self-pay

## 2021-04-08 VITALS — BP 104/73 | HR 90 | Ht 59.0 in | Wt 100.0 lb

## 2021-04-08 DIAGNOSIS — R42 Dizziness and giddiness: Secondary | ICD-10-CM

## 2021-04-08 DIAGNOSIS — R55 Syncope and collapse: Secondary | ICD-10-CM

## 2021-04-08 DIAGNOSIS — E785 Hyperlipidemia, unspecified: Secondary | ICD-10-CM

## 2021-04-08 NOTE — Patient Instructions (Signed)
°  Follow-Up: °At [facility], you and your health needs are our priority.  As part of our continuing mission to provide you with exceptional heart care, we have created designated Provider Care Teams.  These Care Teams include your primary Cardiologist (physician) and Advanced Practice Providers (APPs -  Physician Assistants and Nurse Practitioners) who all work together to provide you with the care you need, when you need it. ° °We recommend signing up for the patient portal called "MyChart".  Sign up information is provided on this After Visit Summary.  MyChart is used to connect with patients for Virtual Visits (Telemedicine).  Patients are able to view lab/test results, encounter notes, upcoming appointments, etc.  Non-urgent messages can be sent to your provider as well.   °To learn more about what you can do with MyChart, go to [email or web address].   ° °Your next appointment:   °6 month(s) ° °The format for your next appointment:   °In Person ° °Provider:  Brian Crenshaw MD ° °}  ° ° °

## 2021-04-25 ENCOUNTER — Telehealth (INDEPENDENT_AMBULATORY_CARE_PROVIDER_SITE_OTHER): Payer: 59 | Admitting: Family

## 2021-04-25 ENCOUNTER — Encounter: Payer: Self-pay | Admitting: Family

## 2021-04-25 ENCOUNTER — Ambulatory Visit (INDEPENDENT_AMBULATORY_CARE_PROVIDER_SITE_OTHER): Payer: 59 | Admitting: *Deleted

## 2021-04-25 ENCOUNTER — Other Ambulatory Visit (HOSPITAL_BASED_OUTPATIENT_CLINIC_OR_DEPARTMENT_OTHER): Payer: Self-pay

## 2021-04-25 ENCOUNTER — Telehealth: Payer: Self-pay | Admitting: Family

## 2021-04-25 VITALS — BP 105/63 | HR 91 | Temp 97.8°F | Ht 59.0 in | Wt 100.0 lb

## 2021-04-25 DIAGNOSIS — J329 Chronic sinusitis, unspecified: Secondary | ICD-10-CM

## 2021-04-25 DIAGNOSIS — B9789 Other viral agents as the cause of diseases classified elsewhere: Secondary | ICD-10-CM

## 2021-04-25 DIAGNOSIS — R0981 Nasal congestion: Secondary | ICD-10-CM

## 2021-04-25 LAB — POCT INFLUENZA A/B
Influenza A, POC: NEGATIVE
Influenza B, POC: NEGATIVE

## 2021-04-25 LAB — POC COVID19 BINAXNOW: SARS Coronavirus 2 Ag: NEGATIVE

## 2021-04-25 MED ORDER — FLUTICASONE PROPIONATE 50 MCG/ACT NA SUSP: 2.0000 | Freq: Every day | NASAL | 0 refills | Status: DC

## 2021-04-25 MED ORDER — FLUTICASONE PROPIONATE 50 MCG/ACT NA SUSP
NASAL | 0 refills | Status: DC
  Filled 2021-04-25: qty 16, 30d supply, fill #0

## 2021-04-25 MED ORDER — METHYLPREDNISOLONE 4 MG PO TBPK
ORAL_TABLET | ORAL | 0 refills | Status: DC
  Filled 2021-04-25: qty 21, 6d supply, fill #0

## 2021-04-25 NOTE — Progress Notes (Signed)
Virtual Visit via Video   I connected with patient on 04/25/21 at  8:20 AM EST by a video enabled telemedicine application and verified that I am speaking with the correct person using two identifiers.  Location patient: Home Location provider: [employer], Office Persons participating in the virtual visit: Patient, Provider, CMA   I discussed the limitations of evaluation and management by telemedicine and the availability of in person appointments. The patient expressed understanding and agreed to proceed.  Subjective:   HPI:   55 year old female presents with concerns of sinus pressure, congestion that has been ongoing despite taking antibiotic therapy. Symptoms ongoing x 4 days without releif. She has been tested for COVID that was negative. She is concerned about influenza. No bodyaches.   ROS:   See pertinent positives and negatives per HPI.  Patient Active Problem List   Diagnosis Date Noted   renal insufficiency 01/07/2021   Otitis externa 01/07/2021   Genetic testing 11/26/2020   Urinary frequency 10/28/2020   Chest pain 10/28/2020   Varicose veins of both lower extremities 10/28/2020   Hemorrhoids 10/28/2020   Sore throat 10/28/2020   Hand injury 04/01/2020   Hyperglycemia 04/01/2020   RLQ abdominal pain 04/01/2020   Nonintractable headache 12/31/2019   Right knee pain 11/19/2019   Constipation 11/17/2019   Memory loss 10/30/2019   Neck pain 09/25/2019   Educated about COVID-19 virus infection 06/21/2019   Insomnia 02/19/2019   Dysuria 01/13/2019   Anxiety and depression 01/13/2019   Cough 07/25/2018   Perimenopausal 49/70/2637   History of Helicobacter pylori infection 12/28/2017   Nocturia 07/19/2016   Rash and nonspecific skin eruption 03/21/2016   Post concussive syndrome 01/22/2016   Syncope 01/08/2016   Osteoarthritis 07/08/2015   Hematuria 01/20/2015   Hyperlipidemia, mild 01/20/2015   Seasonal and perennial allergic rhinitis  01/20/2015   Fatigue 10/24/2014   Sinusitis 10/11/2014   Testing of female for genetic disease carrier status 06/03/2014   Arthralgia 08/20/2013   Diverticulosis 07/01/2011   Menopausal state 03/25/2011   Fibroids 10/24/2010   Anemia 03/25/2010   IRRITABLE BOWEL SYNDROME 03/25/2010   Mitral valve disorder 07/09/2007   Allergic asthma, mild intermittent, uncomplicated 85/88/5027   ESOPHAGEAL REFLUX 07/09/2007   DERMATITIS 07/09/2007   LOW BACK PAIN 12/14/2006    Social History   Tobacco Use   Smoking status: Never   Smokeless tobacco: Never  Substance Use Topics   Alcohol use: Not Currently    Current Outpatient Medications:    fluticasone (FLONASE) 50 MCG/ACT nasal spray, Place 2 sprays into both nostrils daily., Disp: 16 g, Rfl: 0   methylPREDNISolone (MEDROL DOSEPAK) 4 MG TBPK tablet, Take 6 tablets by mouth on day 1, then 5 tablets on day 2, then 4 tablets on day 3, then 3 tablets on day 4, then 2 tablets on day 5, then 1 tablet on day 6, Disp: 21 tablet, Rfl: 0   Multiple Vitamins-Minerals (MULTIVITAMIN ADULT PO), Take 1 tablet by mouth daily., Disp: , Rfl:    OVER THE COUNTER MEDICATION, Calcium, Disp: , Rfl:    OVER THE COUNTER MEDICATION, Omega 3 2100, Disp: , Rfl:    Probiotic Product (PROBIOTIC DAILY PO), Take 1 tablet by mouth daily., Disp: , Rfl:    sodium chloride (OCEAN) 0.65 % SOLN nasal spray, Place 1 spray into both nostrils as needed for congestion., Disp: , Rfl:    fluticasone (FLONASE) 50 MCG/ACT nasal spray, Place 2 sprays into both nostrils once daily,  Disp: 16 g, Rfl: 0   neomycin-polymyxin-hydrocortisone (CORTISPORIN) OTIC solution, Place 3 drops into the right ear 3 (three) times daily. (Patient not taking: Reported on 04/08/2021), Disp: 10 mL, Rfl: 0  Allergies  Allergen Reactions   Amoxicillin Shortness Of Breath    Doesn't remember rxn   Aspirin Shortness Of Breath    "irritated stomach" Ibuprofen and Naproxen do not agree with her either    Codeine Hives, Shortness Of Breath and Other (See Comments)    Does not take percocet or hydrocodone; tylenol only   Latex Shortness Of Breath, Itching and Other (See Comments)   Other Shortness Of Breath and Other (See Comments)    peanuts   Sulfa Antibiotics Hives and Shortness Of Breath   Sulfamethoxazole-Trimethoprim Shortness Of Breath   Sulfonamide Derivatives Shortness Of Breath and Itching   Sulphadimidine Sodium [Sulfamethazine Sodium] Shortness Of Breath    Added to food for preservative   Ciprofloxacin Other (See Comments)    REACTION: SOB, "Tightness in head" REACTION: SOB, "Tightness in head" Syncope    Azithromycin Other (See Comments)    Faintness   Carisoprodol    Clarithromycin     "Doesn't agree with me"   Epinephrine     Heart racing   Fenofibrate Other (See Comments)    Leg cramps, body aches    Metronidazole Other (See Comments)    "cannot tolerate"   Nsaids    Penicillins     Doesn't remember reaction   Tolmetin    Omeprazole Palpitations    Objective:   BP 105/63 (BP Location: Left Arm, Patient Position: Sitting, Cuff Size: Normal)    Pulse 91    Temp 97.8 F (36.6 C) (Temporal)    Ht 4\' 11"  (1.499 m)    Wt 100 lb (45.4 kg)    LMP 06/30/2016    BMI 20.20 kg/m   Patient is well-developed, well-nourished in no acute distress.  Resting comfortably at home.  Head is normocephalic, atraumatic.  No labored breathing.  Speech is clear and coherent with logical content.  Patient is alert and oriented at baseline.    Assessment and Plan:    Dorina was seen today for acute visit.  Diagnoses and all orders for this visit:  Viral sinusitis  Nasal congestion -     Cancel: POC Influenza A&B (Binax test) -     Cancel: POC COVID-19 BinaxNow  Other orders -     methylPREDNISolone (MEDROL DOSEPAK) 4 MG TBPK tablet; Take 6 tablets by mouth on day 1, then 5 tablets on day 2, then 4 tablets on day 3, then 3 tablets on day 4, then 2 tablets on day 5, then  1 tablet on day 6 -     fluticasone (FLONASE) 50 MCG/ACT nasal spray; Place 2 sprays into both nostrils daily.    Screened for COVID and Flu and both are negative at [place] location, negative. Patient indecisive on whether she would prefer oral prednisone or flonase. Says prednisone has caused her depression and anxiety and Flonase has caused headaches. Initially, I sent low dose. Short course of oral prednisone but she has decided that she prefers the nasal spray. Call the office if symptome worsen or persist. Recheck as scheduled and sooner as needed.   Kennyth Arnold, FNP 04/25/2021

## 2021-04-25 NOTE — Progress Notes (Signed)
Patient had virtual visit with Bebe Shaggy, FNP and needed point of care testing done for flu and covid.

## 2021-04-25 NOTE — Telephone Encounter (Signed)
Medication is not offered in spray form. Padonda spoke with patient and addressed this issue.

## 2021-04-25 NOTE — Telephone Encounter (Signed)
Pt was seen today 04/25/21 for flu like symptoms by [facility] by Mychart. She is wanting her  Rx #: [id number]  methylPREDNISolone (MEDROL DOSEPAK) 4 MG TBPK tablet [[id number]] in spray form and not the tablet.   [facility]  [address] [place], [place] [address]  Phone:  [phone]  Fax:  [phone]  DEA #:  [id number]

## 2021-05-01 ENCOUNTER — Ambulatory Visit: Payer: 59

## 2021-05-12 ENCOUNTER — Encounter: Payer: Self-pay | Admitting: Family Medicine

## 2021-05-12 ENCOUNTER — Ambulatory Visit: Payer: 59 | Admitting: Family Medicine

## 2021-05-12 VITALS — BP 108/70 | HR 82 | Temp 98.1°F | Resp 16 | Ht 59.0 in | Wt 105.0 lb

## 2021-05-12 DIAGNOSIS — R0789 Other chest pain: Secondary | ICD-10-CM

## 2021-05-12 DIAGNOSIS — K219 Gastro-esophageal reflux disease without esophagitis: Secondary | ICD-10-CM

## 2021-05-12 DIAGNOSIS — E785 Hyperlipidemia, unspecified: Secondary | ICD-10-CM | POA: Diagnosis not present

## 2021-05-12 DIAGNOSIS — D649 Anemia, unspecified: Secondary | ICD-10-CM

## 2021-05-12 DIAGNOSIS — R350 Frequency of micturition: Secondary | ICD-10-CM

## 2021-05-12 DIAGNOSIS — J4520 Mild intermittent asthma, uncomplicated: Secondary | ICD-10-CM

## 2021-05-12 DIAGNOSIS — R739 Hyperglycemia, unspecified: Secondary | ICD-10-CM

## 2021-05-12 DIAGNOSIS — N186 End stage renal disease: Secondary | ICD-10-CM

## 2021-05-12 DIAGNOSIS — R059 Cough, unspecified: Secondary | ICD-10-CM

## 2021-05-12 LAB — POCT URINALYSIS DIPSTICK
Bilirubin, UA: NEGATIVE
Glucose, UA: NEGATIVE
Ketones, UA: NEGATIVE
Leukocytes, UA: NEGATIVE
Nitrite, UA: NEGATIVE
Protein, UA: NEGATIVE
Spec Grav, UA: <=1.005 — AB (ref 1.010–1.025)
Urobilinogen, UA: 0.2 E.U./dL
pH, UA: 6 (ref 5.0–8.0)

## 2021-05-12 MED ORDER — ALBUTEROL SULFATE HFA 108 (90 BASE) MCG/ACT IN AERS: 2.0000 | INHALATION_SPRAY | Freq: Four times a day (QID) | RESPIRATORY_TRACT | 1 refills | Status: DC | PRN

## 2021-05-12 NOTE — Assessment & Plan Note (Signed)
Encourage heart healthy diet such as MIND or DASH diet, increase exercise, avoid trans fats, simple carbohydrates and processed foods, consider a krill or fish or flaxseed oil cap daily.  °

## 2021-05-12 NOTE — Patient Instructions (Signed)

## 2021-05-12 NOTE — Assessment & Plan Note (Signed)
Hydrate and monitor 

## 2021-05-12 NOTE — Progress Notes (Signed)
Subjective:    Patient ID: Lilliahna Schubring, female    DOB: 11-15-1965, 56 y.o.   MRN: [id number]  Chief Complaint  Patient presents with   4 months follow up    HPI Patient is in today for follow up on chronic medical concerns. No recent febrile illness or hospitalizations. She is struggling with some increased head and chest congestion and some cough. No fevers or chills. She does note some occasional chest discomfort without associated symptoms. No symptoms today. She notes some recent urinary frequency but no urgency or dysuria. Denies CP/palp/SOB/HA/congestion/fevers/GI or GU c/o. Taking meds as prescribed   Past Medical History:  Diagnosis Date   Allergic rhinitis    Anemia    Anxiety    Asthma    Contact dermatitis and eczema    Diverticulitis    CT Scan   Esophageal reflux    H. pylori infection    Hyperlipidemia, mixed    IBS (irritable bowel syndrome)    IC (interstitial cystitis)    Internal hemorrhoids    Lumbago    Mitral valve disorders(424.0)    Osteoarthritis 07/08/2015   Post concussive syndrome    Seasonal and perennial allergic rhinitis 01/20/2015    Past Surgical History:  Procedure Laterality Date   BARTHOLIN CYST MARSUPIALIZATION N/A 12/18/2013   Procedure: BARTHOLIN CYST MARSUPIALIZATION WITH BIOPSY;  Surgeon: Lovenia Kim, MD;  Location: [facility];  Service: Gynecology;  Laterality: N/A;   CESAREAN SECTION     DILITATION & CURRETTAGE/HYSTROSCOPY WITH NOVASURE ABLATION N/A 10/14/2012   Procedure: DILATATION & CURETTAGE/HYSTEROSCOPY WITH NOVASURE ABLATION;  Surgeon: Lovenia Kim, MD;  Location: [facility];  Service: Gynecology;  Laterality: N/A;   MOUTH SURGERY     SINUS SURGERY WITH INSTATRAK      Family History  Problem Relation Age of Onset   Diabetes Mother    Hypertension Mother    Heart attack Mother        MI at age 57   Alcohol abuse Mother        smoker   Heart disease Mother    Dementia Mother    Hypertension Father     Hyperlipidemia Father    Heart attack Father    Lung cancer Father        smoking hx; dx > 33   Colon polyps Sister    Mental illness Sister        anxiety from 9/11 survivors   Varicose Veins Sister    Osteoporosis Sister    Other Sister        hypoglycemia   Varicose Veins Sister    Rheum arthritis Sister    Pleurisy Sister    Fibromyalgia Sister    Mental retardation Sister        depression   Leukemia Brother        acute; dx 34s   Colon cancer Paternal Uncle        dx unknown age   Dementia Maternal Grandmother    Heart disease Maternal Grandfather        mi   Cancer Paternal Grandmother        breast/ovairan before age 55   GER disease Son    Alcohol abuse Other        Family history   Breast cancer Neg Hx     Social History   Socioeconomic History   Marital status: Married    Spouse name: Not on file   Number of children: 2  Years of education: Masters   Highest education level: Not on file  Occupational History   Occupation: Careers adviser  Tobacco Use   Smoking status: Never   Smokeless tobacco: Never  Vaping Use   Vaping Use: Never used  Substance and Sexual Activity   Alcohol use: Not Currently   Drug use: No   Sexual activity: Yes    Comment: lives with husband and son, 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   Right-handed.   1 cup caffeine daily.   Social Determinants of Health   Financial Resource Strain: Not on file  Food Insecurity: Not on file  Transportation Needs: Not on file  Physical Activity: Not on file  Stress: Not on file  Social Connections: Not on file  Intimate Partner Violence: Not on file    Outpatient Medications Prior to Visit  Medication Sig Dispense Refill   fluticasone (FLONASE) 50 MCG/ACT nasal spray Place 2 sprays into both nostrils daily. 16 g 0   fluticasone (FLONASE) 50  MCG/ACT nasal spray Place 2 sprays into both nostrils once daily 16 g 0   Multiple Vitamins-Minerals (MULTIVITAMIN ADULT PO) Take 1 tablet by mouth daily.     neomycin-polymyxin-hydrocortisone (CORTISPORIN) OTIC solution Place 3 drops into the right ear 3 (three) times daily. 10 mL 0   OVER THE COUNTER MEDICATION Calcium     OVER THE COUNTER MEDICATION Omega 3 2100     Probiotic Product (PROBIOTIC DAILY PO) Take 1 tablet by mouth daily.     sodium chloride (OCEAN) 0.65 % SOLN nasal spray Place 1 spray into both nostrils as needed for congestion.     methylPREDNISolone (MEDROL DOSEPAK) 4 MG TBPK tablet Take 6 tablets by mouth on day 1, then 5 tablets on day 2, then 4 tablets on day 3, then 3 tablets on day 4, then 2 tablets on day 5, then 1 tablet on day 6 21 tablet 0   No facility-administered medications prior to visit.    Allergies  Allergen Reactions   Amoxicillin Shortness Of Breath    Doesn't remember rxn   Aspirin Shortness Of Breath    "irritated stomach" Ibuprofen and Naproxen do not agree with her either   Codeine Hives, Shortness Of Breath and Other (See Comments)    Does not take percocet or hydrocodone; tylenol only   Latex Shortness Of Breath, Itching and Other (See Comments)   Other Shortness Of Breath and Other (See Comments)    peanuts   Sulfa Antibiotics Hives and Shortness Of Breath   Sulfamethoxazole-Trimethoprim Shortness Of Breath   Sulfonamide Derivatives Shortness Of Breath and Itching   Sulphadimidine Sodium [Sulfamethazine Sodium] Shortness Of Breath    Added to food for preservative   Ciprofloxacin Other (See Comments)    REACTION: SOB, "Tightness in head" REACTION: SOB, "Tightness in head" Syncope    Azithromycin Other (See Comments)    Faintness   Carisoprodol    Clarithromycin     "Doesn't agree with me"   Epinephrine     Heart racing   Fenofibrate Other (See Comments)    Leg cramps, body aches    Metronidazole Other (See Comments)    "cannot  tolerate"   Nsaids    Penicillins     Doesn't remember reaction   Tolmetin    Omeprazole Palpitations    Review  of Systems  Constitutional:  Positive for malaise/fatigue. Negative for fever.  HENT:  Positive for congestion.   Eyes:  Negative for blurred vision.  Respiratory:  Positive for cough. Negative for shortness of breath.   Cardiovascular:  Positive for chest pain. Negative for palpitations and leg swelling.  Gastrointestinal:  Negative for abdominal pain, blood in stool and nausea.  Genitourinary:  Positive for frequency and urgency. Negative for dysuria.  Musculoskeletal:  Negative for falls.  Skin:  Negative for rash.  Neurological:  Positive for dizziness. Negative for loss of consciousness and headaches.  Endo/Heme/Allergies:  Negative for environmental allergies.  Psychiatric/Behavioral:  Negative for depression. The patient is not nervous/anxious.       Objective:    Physical Exam Constitutional:      General: She is not in acute distress.    Appearance: She is well-developed.  HENT:     Head: Normocephalic and atraumatic.  Eyes:     Conjunctiva/sclera: Conjunctivae normal.  Neck:     Thyroid: No thyromegaly.  Cardiovascular:     Rate and Rhythm: Normal rate and regular rhythm.     Heart sounds: Normal heart sounds. No murmur heard. Pulmonary:     Effort: Pulmonary effort is normal. No respiratory distress.     Breath sounds: Normal breath sounds.  Abdominal:     General: Bowel sounds are normal. There is no distension.     Palpations: Abdomen is soft. There is no mass.     Tenderness: There is no abdominal tenderness.  Musculoskeletal:     Cervical back: Neck supple.  Lymphadenopathy:     Cervical: No cervical adenopathy.  Skin:    General: Skin is warm and dry.  Neurological:     Mental Status: She is alert and oriented to person, place, and time.  Psychiatric:        Behavior: Behavior normal.    BP 108/70    Pulse 82    Temp 98.1 F (36.7 C)     Resp 16    Ht 4\' 11"  (1.499 m)    Wt 105 lb (47.6 kg)    LMP 06/30/2016    SpO2 95%    BMI 21.21 kg/m  Wt Readings from Last 3 Encounters:  05/12/21 105 lb (47.6 kg)  04/25/21 100 lb (45.4 kg)  04/08/21 100 lb (45.4 kg)    Diabetic Foot Exam - Simple   No data filed    Lab Results  Component Value Date   WBC 3.7 (L) 01/10/2021   HGB 13.7 01/10/2021   HCT 39.7 01/10/2021   PLT 277.0 01/10/2021   GLUCOSE 86 01/10/2021   CHOL 198 01/10/2021   TRIG 225.0 (H) 01/10/2021   HDL 48.10 01/10/2021   LDLDIRECT 130.0 01/10/2021   LDLCALC 139 (H) 10/22/2020   ALT 14 01/10/2021   AST 18 01/10/2021   NA 139 01/10/2021   K 4.6 01/10/2021   CL 102 01/10/2021   CREATININE 0.61 01/10/2021   BUN 13 01/10/2021   CO2 30 01/10/2021   TSH 1.44 01/10/2021   HGBA1C 5.2 01/10/2021    Lab Results  Component Value Date   TSH 1.44 01/10/2021   Lab Results  Component Value Date   WBC 3.7 (L) 01/10/2021   HGB 13.7 01/10/2021   HCT 39.7 01/10/2021   MCV 99.0 01/10/2021   PLT 277.0 01/10/2021   Lab Results  Component Value Date   NA 139 01/10/2021   K 4.6 01/10/2021   CO2 30 01/10/2021  GLUCOSE 86 01/10/2021   BUN 13 01/10/2021   CREATININE 0.61 01/10/2021   BILITOT 1.2 01/10/2021   ALKPHOS 93 01/10/2021   AST 18 01/10/2021   ALT 14 01/10/2021   PROT 7.0 01/10/2021   ALBUMIN 4.3 01/10/2021   CALCIUM 9.7 01/10/2021   ANIONGAP 10 01/09/2019   GFR 100.43 01/10/2021   Lab Results  Component Value Date   CHOL 198 01/10/2021   Lab Results  Component Value Date   HDL 48.10 01/10/2021   Lab Results  Component Value Date   LDLCALC 139 (H) 10/22/2020   Lab Results  Component Value Date   TRIG 225.0 (H) 01/10/2021   Lab Results  Component Value Date   CHOLHDL 4 01/10/2021   Lab Results  Component Value Date   HGBA1C 5.2 01/10/2021       Assessment & Plan:   Problem List Items Addressed This Visit     Allergic asthma, mild intermittent, uncomplicated    Given a  refill on Albuterol to use prn she will report if her symptoms worsen, can use Mucinex bid as well.      Relevant Medications   albuterol (VENTOLIN HFA) 108 (90 Base) MCG/ACT inhaler   ESOPHAGEAL REFLUX   Relevant Orders   Comprehensive metabolic panel   TSH   Anemia - Primary   Hyperlipidemia, mild    Encourage heart healthy diet such as MIND or DASH diet, increase exercise, avoid trans fats, simple carbohydrates and processed foods, consider a krill or fish or flaxseed oil cap daily.       Relevant Orders   TSH   Lipid panel   Cough   Relevant Orders   CBC with Differential/Platelet   TSH   Hyperglycemia    hgba1c acceptable, minimize simple carbs. Increase exercise as tolerated.       Relevant Orders   Hemoglobin A1c   Comprehensive metabolic panel   TSH   Frequent urination    Check UA and culture      Relevant Orders   POC Urinalysis Dipstick (Completed)   Urine Culture   Atypical chest pain    No pain today but she has had some intermittent episodes of vague chest discomfort without associated symptoms this past month. EKG shows NSR at 78 bpm and no acute ST or T wave changes      Relevant Orders   EKG 12-Lead (Completed)   renal insufficiency    Hydrate and monitor       I have discontinued Janine Waldrep's methylPREDNISolone. I am also having her start on albuterol. Additionally, I am having her maintain her Probiotic Product (PROBIOTIC DAILY PO), OVER THE COUNTER MEDICATION, Multiple Vitamins-Minerals (MULTIVITAMIN ADULT PO), sodium chloride, OVER THE COUNTER MEDICATION, neomycin-polymyxin-hydrocortisone, fluticasone, and fluticasone.  Meds ordered this encounter  Medications   albuterol (VENTOLIN HFA) 108 (90 Base) MCG/ACT inhaler    Sig: Inhale 2 puffs into the lungs every 6 (six) hours as needed for wheezing or shortness of breath.    Dispense:  18 g    Refill:  1     Penni Homans, MD

## 2021-05-12 NOTE — Assessment & Plan Note (Signed)
Check UA and culture 

## 2021-05-12 NOTE — Assessment & Plan Note (Addendum)
hgba1c acceptable, minimize simple carbs. Increase exercise as tolerated.  

## 2021-05-12 NOTE — Assessment & Plan Note (Signed)
Given a refill on Albuterol to use prn she will report if her symptoms worsen, can use Mucinex bid as well.

## 2021-05-12 NOTE — Assessment & Plan Note (Signed)
No pain today but she has had some intermittent episodes of vague chest discomfort without associated symptoms this past month. EKG shows NSR at 78 bpm and no acute ST or T wave changes

## 2021-05-20 ENCOUNTER — Other Ambulatory Visit (INDEPENDENT_AMBULATORY_CARE_PROVIDER_SITE_OTHER): Payer: 59

## 2021-05-20 ENCOUNTER — Encounter: Payer: Self-pay | Admitting: Family Medicine

## 2021-05-20 ENCOUNTER — Other Ambulatory Visit: Payer: Self-pay

## 2021-05-20 DIAGNOSIS — R059 Cough, unspecified: Secondary | ICD-10-CM

## 2021-05-20 DIAGNOSIS — R300 Dysuria: Secondary | ICD-10-CM

## 2021-05-20 DIAGNOSIS — R739 Hyperglycemia, unspecified: Secondary | ICD-10-CM

## 2021-05-20 DIAGNOSIS — K219 Gastro-esophageal reflux disease without esophagitis: Secondary | ICD-10-CM

## 2021-05-20 DIAGNOSIS — E785 Hyperlipidemia, unspecified: Secondary | ICD-10-CM

## 2021-05-20 LAB — CBC WITH DIFFERENTIAL/PLATELET
Basophils Absolute: 0 10*3/uL (ref 0.0–0.1)
Basophils Relative: 0.8 % (ref 0.0–3.0)
Eosinophils Absolute: 0.1 10*3/uL (ref 0.0–0.7)
Eosinophils Relative: 3.3 % (ref 0.0–5.0)
HCT: 40.3 % (ref 36.0–46.0)
Hemoglobin: 13.4 g/dL (ref 12.0–15.0)
Lymphocytes Relative: 56.6 % — ABNORMAL HIGH (ref 12.0–46.0)
Lymphs Abs: 2 10*3/uL (ref 0.7–4.0)
MCHC: 33.4 g/dL (ref 30.0–36.0)
MCV: 98.2 fl (ref 78.0–100.0)
Monocytes Absolute: 0.4 10*3/uL (ref 0.1–1.0)
Monocytes Relative: 10.7 % (ref 3.0–12.0)
Neutro Abs: 1 10*3/uL — ABNORMAL LOW (ref 1.4–7.7)
Neutrophils Relative %: 28.6 % — ABNORMAL LOW (ref 43.0–77.0)
Platelets: 272 10*3/uL (ref 150.0–400.0)
RBC: 4.1 Mil/uL (ref 3.87–5.11)
RDW: 12.3 % (ref 11.5–15.5)
WBC: 3.5 10*3/uL — ABNORMAL LOW (ref 4.0–10.5)

## 2021-05-20 LAB — HEMOGLOBIN A1C: Hgb A1c MFr Bld: 5.1 % (ref 4.6–6.5)

## 2021-05-20 LAB — LIPID PANEL
Cholesterol: 220 mg/dL — ABNORMAL HIGH (ref 0–200)
HDL: 52.8 mg/dL (ref >39.00)
LDL Cholesterol: 142 mg/dL — ABNORMAL HIGH (ref 0–99)
NonHDL: 167.38
Total CHOL/HDL Ratio: 4
Triglycerides: 125 mg/dL (ref 0.0–149.0)
VLDL: 25 mg/dL (ref 0.0–40.0)

## 2021-05-20 LAB — COMPREHENSIVE METABOLIC PANEL
ALT: 13 U/L (ref 0–35)
AST: 16 U/L (ref 0–37)
Albumin: 4.4 g/dL (ref 3.5–5.2)
Alkaline Phosphatase: 100 U/L (ref 39–117)
BUN: 11 mg/dL (ref 6–23)
CO2: 30 mEq/L (ref 19–32)
Calcium: 9.5 mg/dL (ref 8.4–10.5)
Chloride: 101 mEq/L (ref 96–112)
Creatinine, Ser: 0.62 mg/dL (ref 0.40–1.20)
GFR: 99.79 mL/min (ref >60.00)
Glucose, Bld: 80 mg/dL (ref 70–99)
Potassium: 3.8 mEq/L (ref 3.5–5.1)
Sodium: 139 mEq/L (ref 135–145)
Total Bilirubin: 1.1 mg/dL (ref 0.2–1.2)
Total Protein: 6.9 g/dL (ref 6.0–8.3)

## 2021-05-20 LAB — TSH: TSH: 2.35 u[IU]/mL (ref 0.35–5.50)

## 2021-05-20 NOTE — Addendum Note (Signed)
Addended by: Manuela Schwartz on: 05/20/2021 09:13 AM   Modules accepted: Orders

## 2021-05-21 ENCOUNTER — Telehealth: Payer: Self-pay | Admitting: Family Medicine

## 2021-05-21 ENCOUNTER — Other Ambulatory Visit: Payer: Self-pay

## 2021-05-21 DIAGNOSIS — R0789 Other chest pain: Secondary | ICD-10-CM

## 2021-05-21 LAB — URINE CULTURE
MICRO NUMBER:: 12911730
SPECIMEN QUALITY:: ADEQUATE

## 2021-05-21 NOTE — Telephone Encounter (Signed)
Cxr placed

## 2021-05-21 NOTE — Telephone Encounter (Signed)
pt would like a referral for x-ray on lungs and  for someone to contact her regarding meds after lab results. Please advise.

## 2021-05-21 NOTE — Telephone Encounter (Signed)
Pt states that she been having pain left lower rib and it feels warms. Also wanted to know if she should take prednisone due her labs.

## 2021-05-24 ENCOUNTER — Other Ambulatory Visit: Payer: Self-pay

## 2021-05-24 ENCOUNTER — Ambulatory Visit (HOSPITAL_BASED_OUTPATIENT_CLINIC_OR_DEPARTMENT_OTHER)
Admission: RE | Admit: 2021-05-24 | Discharge: 2021-05-24 | Disposition: A | Discharge status: Home / Self Care | Payer: 59 | Source: Ambulatory Visit | Attending: Family Medicine | Admitting: Family Medicine

## 2021-05-24 DIAGNOSIS — R0789 Other chest pain: Secondary | ICD-10-CM | POA: Diagnosis present

## 2021-05-28 ENCOUNTER — Telehealth: Payer: Self-pay | Admitting: Family Medicine

## 2021-05-28 NOTE — Telephone Encounter (Signed)
Patient would like referral for genic testing to test different meds.  Patient also mentioned her ldl cholesterol levels were high and would like recommendations.

## 2021-05-29 NOTE — Telephone Encounter (Signed)
Pt aware.

## 2021-06-26 ENCOUNTER — Ambulatory Visit
Admission: RE | Admit: 2021-06-26 | Discharge: 2021-06-26 | Disposition: A | Discharge status: Home / Self Care | Payer: 59 | Source: Ambulatory Visit | Attending: Obstetrics and Gynecology | Admitting: Obstetrics and Gynecology

## 2021-06-26 DIAGNOSIS — Z1231 Encounter for screening mammogram for malignant neoplasm of breast: Secondary | ICD-10-CM

## 2021-06-26 LAB — HM MAMMOGRAPHY

## 2021-07-02 LAB — HM PAP SMEAR: HM Pap smear: NEGATIVE

## 2021-07-02 LAB — RESULTS CONSOLE HPV: CHL HPV: NEGATIVE

## 2021-07-02 LAB — HM HIV SCREENING LAB: HM HIV Screening: NEGATIVE

## 2021-07-02 LAB — HM HEPATITIS C SCREENING LAB: HM Hepatitis Screen: NEGATIVE

## 2021-07-23 LAB — COLOGUARD: Cologuard: NEGATIVE

## 2021-07-24 NOTE — Progress Notes (Signed)
? ? Patient ID: [name], female    DOB: 12-Sep-1965, 56 y.o.   MRN: [id number] ? ?HPI ?female never smoker followed for allergic rhinitis, asthma. Complicated by History anxiety, GERD, IBS ?Office Spirometry 08/01/2015-within normal. FVC 3.02/112%, FEV1 2.36/104%, FEV1/FVC 0.78, FEF 25-75 percent 2.26/84%. ?PFT 11/26/2010 within normal limits within significant response to bronchodilator. FEV1/FVC 0.79 ? ?-------------------------------------------------------------------------------- ? ?07/31/20- 56 year old female never smoker followed for Allergic Rhinitis, Asthma.  Complicated by GERD, history Anxiety, IBS ?-Neb albuterol, albuterol hfa, benzonatate, flonase, ?Covid vax-3 Phizer,  ?Flu vax-had ?-----Congestion for last few weeks.  ?Since pollen started noting dry cough back of throat, minimal stuffiness, no chest congestion or wheeze. Increased nasal congestion. ?Had fairly intense local reaction on arm to her 3rd covid vax. Now resolved, but she asks if this could have inflamed her internal hemorrhoids. Admits constipation, insufficient water, has started Miralax.  ?Asks depomedrol. ? ?07/25/21- 56 year old female never smoker followed for Allergic Rhinitis, Asthma.  Complicated by GERD, history Anxiety, IBS ?-Neb albuterol, albuterol hfa, benzonatate,  ?Covid vax-4 Phizer,  ?Flu vax-had ?ACT score 24 ?Asked about lymphocytosis. Has hx nasal polypectomy, inviting consideration of Biologic therapy. ?Emphasized use of nasal steroid. ?CXR 05/25/21- ?IMPRESSION: ?No active cardiopulmonary disease. ? ?Review of Systems-see HPI     + = positive ?Constitutional:   No-   weight loss, night sweats, fevers, chills, fatigue, lassitude. ?HEENT:    headaches, difficulty swallowing, tooth/dental problems, sore throat,  ?     No-  sneezing, itching, ear ache, +nasal congestion, +post nasal drip,  ?CV:  No-   chest pain, orthopnea, PND, swelling in lower extremities, anasarca, dizziness, palpitations ?Resp: shortness of  breath with exertion or at rest.   ?             +non-productive cough,  No- coughing up of blood.   ?              change in color of mucus.  No recent  wheezing.   ?Skin: No-   rash or lesions. ?GI:  No-   heartburn, indigestion, abdominal pain, nausea, vomiting, ?GU:  ?MS: + joint pain or swelling.   ?Neuro-     nothing unusual ?Psych:  No- change in mood or affect. No depression + anxiety.  No memory loss. ? ?Objective:  ? Physical Exam ?General- Alert, Oriented, Affect-anxious/ talkative, Distress- none acute, trim, petite woman    ?Skin- rash-none, lesions- none, excoriation- none ?Lymphadenopathy- none ?Head- atraumatic ?           Eyes- Gross vision intact, PERRLA, conjunctivae clear secretions ?           Ears- Hearing, canals normal. +Scarring or sclerosis TMs ?           Nose- +Narrow with turbinate edema, clear mucus bridging, No- Septal dev, polyps, erosion, perforation  ?           Throat- Mallampati III , mucosa clear , drainage- none, tonsils- atrophic ?Neck- flexible , trachea midline, no stridor , thyroid nl, carotid no bruit ?Chest - symmetrical excursion , unlabored ?          Heart/CV- RRR , no murmur , no gallop  , no rub, nl s1 s2 ?                          - JVD- none , edema- none, stasis changes- none, varices- none ?          Lung-  clear to P&A, wheeze- none, cough- none , dullness-none, rub- none ?          Chest wall-  ?Abd-  ?Br/ Gen/ Rectal- Not done, not indicated ?Extrem- cyanosis- none, clubbing, none, atrophy- none, strength- nl ?Neuro- grossly intact to observation ? ? ? ? ? ?

## 2021-07-25 ENCOUNTER — Encounter: Payer: Self-pay | Admitting: Internal Medicine

## 2021-07-25 ENCOUNTER — Ambulatory Visit (INDEPENDENT_AMBULATORY_CARE_PROVIDER_SITE_OTHER): Payer: 59 | Admitting: Internal Medicine

## 2021-07-25 DIAGNOSIS — J302 Other seasonal allergic rhinitis: Secondary | ICD-10-CM

## 2021-07-25 DIAGNOSIS — J3089 Other allergic rhinitis: Secondary | ICD-10-CM | POA: Diagnosis not present

## 2021-07-25 DIAGNOSIS — J069 Acute upper respiratory infection, unspecified: Secondary | ICD-10-CM

## 2021-07-25 MED ORDER — PORTABLE COMPRESSOR NEBULIZER MISC
99 refills | Status: DC
Start: 2021-07-25 — End: 2022-11-25

## 2021-07-25 MED ORDER — CROMOLYN SODIUM 5.2 MG/ACT NA AERS: 1.0000 | INHALATION_SPRAY | Freq: Four times a day (QID) | NASAL | 12 refills | Status: DC

## 2021-07-25 MED ORDER — ALBUTEROL SULFATE (2.5 MG/3ML) 0.083% IN NEBU
2.5000 mg | INHALATION_SOLUTION | Freq: Four times a day (QID) | RESPIRATORY_TRACT | 12 refills | Status: DC | PRN
Start: 2021-07-25 — End: 2022-07-14

## 2021-07-25 MED ORDER — ALBUTEROL SULFATE HFA 108 (90 BASE) MCG/ACT IN AERS: 2.0000 | INHALATION_SPRAY | Freq: Four times a day (QID) | RESPIRATORY_TRACT | 1 refills | Status: DC | PRN

## 2021-07-25 NOTE — Patient Instructions (Addendum)
Refills sent for Nasalcrom, Albuterol rescue inhaler and neb solution ? ?Script printed for portable nebulizer machine ? ?Please call if we can help ? ? ?

## 2021-07-30 LAB — COLOGUARD
COLOGUARD: NEGATIVE
COLOGUARD: NEGATIVE

## 2021-07-30 LAB — EXTERNAL GENERIC LAB PROCEDURE: COLOGUARD: NEGATIVE

## 2021-07-31 ENCOUNTER — Ambulatory Visit: Payer: 59 | Admitting: Internal Medicine

## 2021-08-24 ENCOUNTER — Encounter: Payer: Self-pay | Admitting: Internal Medicine

## 2021-08-24 NOTE — Assessment & Plan Note (Signed)
Discussed nasal steroid vs cromolyn and suggested trial of Nasalcrom ?Watch for recurrence of nasal polyps ?

## 2021-08-24 NOTE — Assessment & Plan Note (Signed)
Watch need for Biologic therapy ?Refilling albuterol hfa and neb solution ?

## 2021-08-25 NOTE — Progress Notes (Signed)
? ? ?MyChart Video Visit ? ? ? ?Virtual Visit via Video Note  ? ?This visit type was conducted due to national recommendations for restrictions regarding the COVID-19 Pandemic (e.g. social distancing) in an effort to limit this patient's exposure and mitigate transmission in our community. This patient is at least at moderate risk for complications without adequate follow up. This format is felt to be most appropriate for this patient at this time. Physical exam was limited by quality of the video and audio technology used for the visit. Verdell Carmine., CMA was able to get the patient set up on a video visit. ? ?Patient location: Home Patient and provider in visit ?Provider location: Office ? ?I discussed the limitations of evaluation and management by telemedicine and the availability of in person appointments. The patient expressed understanding and agreed to proceed. ? ?Visit Date: 08/26/2021 ? ?Today's healthcare provider: Penni Homans, MD  ? ? ? ?Subjective:  ? ? Patient ID: [name], female    DOB: [birth year], 56 y.o.   MRN: [id number] ? ?No chief complaint on file. ? ? ?HPI ?Patient is in today for follow up on chronic medical concerns. No recent febrile illness or recent hospitalizations. She continues to have stress at work but is managing well. Denies CP/palp/SOB/HA/congestion/fevers/GI or GU c/o. Taking meds as prescribed  ? ?Past Medical History:  ?Diagnosis Date  ? Allergic rhinitis   ? Anemia   ? Anxiety   ? Asthma   ? Contact dermatitis and eczema   ? Diverticulitis   ? CT Scan  ? Esophageal reflux   ? H. pylori infection   ? Hyperlipidemia, mixed   ? IBS (irritable bowel syndrome)   ? IC (interstitial cystitis)   ? Internal hemorrhoids   ? Lumbago   ? Mitral valve disorders(424.0)   ? Osteoarthritis 07/08/2015  ? Post concussive syndrome   ? Seasonal and perennial allergic rhinitis 01/20/2015  ? ? ?Past Surgical History:  ?Procedure Laterality Date  ? BARTHOLIN CYST MARSUPIALIZATION N/A 12/18/2013  ?  Procedure: BARTHOLIN CYST MARSUPIALIZATION WITH BIOPSY;  Surgeon: Lovenia Kim, MD;  Location: [facility];  Service: Gynecology;  Laterality: N/A;  ? CESAREAN SECTION    ? DILITATION & CURRETTAGE/HYSTROSCOPY WITH NOVASURE ABLATION N/A 10/14/2012  ? Procedure: DILATATION & CURETTAGE/HYSTEROSCOPY WITH NOVASURE ABLATION;  Surgeon: Lovenia Kim, MD;  Location: [facility];  Service: Gynecology;  Laterality: N/A;  ? MOUTH SURGERY    ? SINUS SURGERY WITH INSTATRAK    ? ? ?Family History  ?Problem Relation Age of Onset  ? Diabetes Mother   ? Hypertension Mother   ? Heart attack Mother   ?     MI at age 76  ? Alcohol abuse Mother   ?     smoker  ? Heart disease Mother   ? Dementia Mother   ? Hypertension Father   ? Hyperlipidemia Father   ? Heart attack Father   ? Lung cancer Father   ?     smoking hx; dx > 50  ? Colon polyps Sister   ? Mental illness Sister   ?     anxiety from 9/11 survivors  ? Varicose Veins Sister   ? Osteoporosis Sister   ? Other Sister   ?     hypoglycemia  ? Varicose Veins Sister   ? Rheum arthritis Sister   ? Pleurisy Sister   ? Fibromyalgia Sister   ? Mental retardation Sister   ?     depression  ?  Leukemia Brother   ?     acute; dx 91s  ? Colon cancer Paternal Uncle   ?     dx unknown age  ? Dementia Maternal Grandmother   ? Heart disease Maternal Grandfather   ?     mi  ? Cancer Paternal Grandmother   ?     breast/ovairan before age 58  ? GER disease Son   ? Alcohol abuse Other   ?     Family history  ? Breast cancer Neg Hx   ? ? ?Social History  ? ?Socioeconomic History  ? Marital status: Married  ?  Spouse name: Not on file  ? Number of children: 2  ? Years of education: Masters  ? Highest education level: Not on file  ?Occupational History  ? Occupation: [employer]  ?Tobacco Use  ? Smoking status: Never  ? Smokeless tobacco: Never  ?Vaping Use  ? Vaping Use: Never used  ?Substance and Sexual Activity  ? Alcohol use: Not Currently  ? Drug use: No  ? Sexual activity: Yes  ?   Comment: lives with husband and son, work with [employer] as a Clinical research associate, aoivd gluten, dairy  ?Other Topics Concern  ? Not on file  ?Social History Narrative  ? Lives at home home with husband and son.  ? Right-handed.  ? 1 cup caffeine daily.  ? ?Social Determinants of Health  ? ?Financial Resource Strain: Not on file  ?Food Insecurity: Not on file  ?Transportation Needs: Not on file  ?Physical Activity: Not on file  ?Stress: Not on file  ?Social Connections: Not on file  ?Intimate Partner Violence: Not on file  ? ? ?Outpatient Medications Prior to Visit  ?Medication Sig Dispense Refill  ? albuterol (PROVENTIL) (2.5 MG/3ML) 0.083% nebulizer solution Take 3 mLs (2.5 mg total) by nebulization every 6 (six) hours as needed for wheezing or shortness of breath. 75 mL 12  ? albuterol (VENTOLIN HFA) 108 (90 Base) MCG/ACT inhaler Inhale 2 puffs into the lungs every 6 (six) hours as needed for wheezing or shortness of breath. 18 g 1  ? cromolyn (NASALCROM) 5.2 MG/ACT nasal spray Place 1 spray into both nostrils 4 (four) times daily. 26 mL 12  ? fluticasone (FLONASE) 50 MCG/ACT nasal spray Place 2 sprays into both nostrils daily. 16 g 0  ? Multiple Vitamins-Minerals (MULTIVITAMIN ADULT PO) Take 1 tablet by mouth daily.    ? Nebulizers (PORTABLE COMPRESSOR NEBULIZER) MISC Use as directed 1 each PRN  ? neomycin-polymyxin-hydrocortisone (CORTISPORIN) OTIC solution Place 3 drops into the right ear 3 (three) times daily. 10 mL 0  ? OVER THE COUNTER MEDICATION Calcium    ? OVER THE COUNTER MEDICATION Omega 3 2100    ? Probiotic Product (PROBIOTIC DAILY PO) Take 1 tablet by mouth daily.    ? sodium chloride (OCEAN) 0.65 % SOLN nasal spray Place 1 spray into both nostrils as needed for congestion.    ? ?No facility-administered medications prior to visit.  ? ? ?Allergies  ?Allergen Reactions  ? Amoxicillin Shortness Of Breath  ?  Doesn't remember rxn  ? Aspirin Shortness Of Breath  ?  "irritated stomach" ?Ibuprofen and Naproxen do not  agree with her either  ? Codeine Hives, Shortness Of Breath and Other (See Comments)  ?  Does not take percocet or hydrocodone; tylenol only  ? Latex Shortness Of Breath, Itching and Other (See Comments)  ? Other Shortness Of Breath and Other (See Comments)  ?  peanuts  ? Sulfa Antibiotics Hives and Shortness Of Breath  ? Sulfamethoxazole-Trimethoprim Shortness Of Breath  ? Sulfonamide Derivatives Shortness Of Breath and Itching  ? Sulphadimidine Sodium [Sulfamethazine Sodium] Shortness Of Breath  ?  Added to food for preservative  ? Ciprofloxacin Other (See Comments)  ?  REACTION: SOB, "Tightness in head" ?REACTION: SOB, "Tightness in head" ?Syncope ?  ? Azithromycin Other (See Comments)  ?  Faintness  ? Carisoprodol   ? Clarithromycin   ?  "Doesn't agree with me"  ? Epinephrine   ?  Heart racing  ? Fenofibrate Other (See Comments)  ?  Leg cramps, body aches   ? Metronidazole Other (See Comments)  ?  "cannot tolerate"  ? Nsaids   ? Penicillins   ?  Doesn't remember reaction  ? Tolmetin   ? Omeprazole Palpitations  ? ? ?Review of Systems  ?Constitutional:  Negative for fever and malaise/fatigue.  ?HENT:  Negative for congestion.   ?Eyes:  Negative for blurred vision.  ?Respiratory:  Negative for shortness of breath.   ?Cardiovascular:  Negative for chest pain, palpitations and leg swelling.  ?Gastrointestinal:  Negative for abdominal pain, blood in stool and nausea.  ?Genitourinary:  Negative for dysuria and frequency.  ?Musculoskeletal:  Negative for falls.  ?Skin:  Negative for rash.  ?Neurological:  Negative for dizziness, loss of consciousness and headaches.  ?Endo/Heme/Allergies:  Negative for environmental allergies.  ?Psychiatric/Behavioral:  Negative for depression. The patient is nervous/anxious.   ? ?   ?Objective:  ?  ?Physical Exam ?Constitutional:   ?   General: She is not in acute distress. ?   Appearance: Normal appearance. She is not ill-appearing or toxic-appearing.  ?HENT:  ?   Head: Normocephalic  and atraumatic.  ?   Right Ear: External ear normal.  ?   Left Ear: External ear normal.  ?   Nose: Nose normal.  ?Eyes:  ?   General:     ?   Right eye: No discharge.     ?   Left eye: No discharge.  ?P

## 2021-08-26 ENCOUNTER — Encounter: Payer: Self-pay | Admitting: Family Medicine

## 2021-08-26 ENCOUNTER — Telehealth (INDEPENDENT_AMBULATORY_CARE_PROVIDER_SITE_OTHER): Payer: 59 | Admitting: Family Medicine

## 2021-08-26 VITALS — BP 107/63

## 2021-08-26 DIAGNOSIS — J4520 Mild intermittent asthma, uncomplicated: Secondary | ICD-10-CM

## 2021-08-26 DIAGNOSIS — I7300 Raynaud's syndrome without gangrene: Secondary | ICD-10-CM | POA: Insufficient documentation

## 2021-08-26 DIAGNOSIS — D649 Anemia, unspecified: Secondary | ICD-10-CM

## 2021-08-26 DIAGNOSIS — R739 Hyperglycemia, unspecified: Secondary | ICD-10-CM | POA: Diagnosis not present

## 2021-08-26 DIAGNOSIS — K219 Gastro-esophageal reflux disease without esophagitis: Secondary | ICD-10-CM

## 2021-08-26 DIAGNOSIS — F32A Depression, unspecified: Secondary | ICD-10-CM

## 2021-08-26 DIAGNOSIS — N186 End stage renal disease: Secondary | ICD-10-CM

## 2021-08-26 DIAGNOSIS — E785 Hyperlipidemia, unspecified: Secondary | ICD-10-CM | POA: Diagnosis not present

## 2021-08-26 DIAGNOSIS — F419 Anxiety disorder, unspecified: Secondary | ICD-10-CM

## 2021-08-26 NOTE — Patient Instructions (Signed)
Raynaud's Phenomenon  Raynaud's phenomenon is a condition that affects the blood vessels (arteries) that carry blood to the fingers and toes. The arteries that supply blood to the ears, lips, nipples, or the tip of the nose might also be affected. Raynaud's phenomenon causes the arteries to become narrow temporarily (spasm). As a result, the flow of blood to the affected areas is temporarily decreased. This usually occurs in response to cold temperatures or stress. During an attack, the skin in the affected areas turns white, then blue, and finally red. A person may also feel tingling or numbness in those areas. Attacks usually last for only a brief period, and then the blood flow to the area returns to normal. In most cases, Raynaud's phenomenon does not cause serious health problems. What are the causes? In many cases, the cause of this condition is not known. The condition may occur on its own (primary Raynaud's phenomenon) or may be associated with other diseases or factors (secondary Raynaud's phenomenon). Possible causes may include: Diseases or medical conditions that damage the arteries. Injuries and repetitive actions that hurt the hands or feet. Being exposed to certain chemicals. Taking medicines that narrow the arteries. Other medical conditions, such as lupus, scleroderma, rheumatoid arthritis, thyroid problems, blood disorders, Sjogren syndrome, or atherosclerosis. What increases the risk? The following factors may make you more likely to develop this condition: Being 20-40 years old. Being female. Having a family history of Raynaud's phenomenon. Living in a cold climate. Smoking. What are the signs or symptoms? Symptoms of this condition usually occur when you are exposed to cold temperatures or when you have emotional stress. The symptoms may last for a few minutes or up to several hours. They usually affect your fingers but may also affect your toes, nipples, lips, ears, or the  tip of your nose. Symptoms may include: Changes in skin color. The skin in the affected areas will turn pale or white. The skin may then change from white to bluish to red as normal blood flow returns to the area. Numbness, tingling, or pain in the affected areas. In severe cases, symptoms may include: Skin sores. Tissues decaying and dying (gangrene). How is this diagnosed? This condition may be diagnosed based on: Your symptoms and medical history. A physical exam. During the exam, you may be asked to put your hands in cold water to check for a reaction to cold temperature. Tests, such as: Blood tests to check for other diseases or conditions. A test to check the movement of blood through your arteries and veins (vascular ultrasound). A test in which the skin at the base of your fingernail is examined under a microscope (nailfold capillaroscopy). How is this treated? During an episode, you can take actions to help symptoms go away faster. Options include moving your arms around in a windmill pattern, warming your fingers under warm water, or placing your fingers in a warm body fold, such as your armpit. Long-term treatment for this condition often involves making lifestyle changes and taking steps to control your exposure to cold temperature. For more severe cases, medicine (calcium channel blockers) may be used to improve blood circulation. Follow these instructions at home: Avoiding cold temperatures Take these steps to avoid exposure to cold: If possible, stay indoors during cold weather. When you go outside during cold weather, dress in layers and wear mittens, a hat, a scarf, and warm footwear. Wear mittens or gloves when handling ice or frozen food. Use holders for glasses or cans containing   cold drinks. Let warm water run for a while before taking a shower or bath. Warm up the car before driving in cold weather. Lifestyle If possible, avoid stressful and emotional situations. Try  to find ways to manage your stress, such as: Exercise. Yoga. Meditation. Biofeedback. Do not use any products that contain nicotine or tobacco. These products include cigarettes, chewing tobacco, and vaping devices, such as e-cigarettes. If you need help quitting, ask your health care provider. Avoid secondhand smoke. Limit your use of caffeine. Switch to decaffeinated coffee, tea, and soda. Avoid chocolate. Avoid vibrating tools and machinery. General instructions Protect your hands and feet from injuries, cuts, or bruises. Avoid wearing tight rings or wristbands. Wear loose fitting socks and comfortable, roomy shoes. Take over-the-counter and prescription medicines only as told by your health care provider. Where to find support Raynaud's Association: www.raynauds.org Where to find more information National Institute of Arthritis and Musculoskeletal and Skin Diseases: www.niams.nih.gov Contact a health care provider if: Your discomfort becomes worse despite lifestyle changes. You develop sores on your fingers or toes that do not heal. You have breaks in the skin on your fingers or toes. You have a fever. You have pain or swelling in your joints. You have a rash. Your symptoms occur on only one side of your body. Get help right away if: Your fingers or toes turn black. You have severe pain in the affected areas. These symptoms may represent a serious problem that is an emergency. Do not wait to see if the symptoms will go away. Get medical help right away. Call your local emergency services (911 in the [place] Do not drive yourself to the hospital. Summary Raynaud's phenomenon is a condition that affects the arteries that carry blood to the fingers, toes, ears, lips, nipples, or the tip of the nose. In many cases, the cause of this condition is not known. Symptoms of this condition include changes in skin color along with numbness and tingling in the affected area. Treatment for  this condition includes lifestyle changes and reducing exposure to cold temperatures. Medicines may be used for severe cases of the condition. Contact your health care provider if your condition worsens despite treatment. This information is not intended to replace advice given to you by your health care provider. Make sure you discuss any questions you have with your health care provider. Document Revised: 06/18/2020 Document Reviewed: 06/18/2020 Elsevier Patient Education  2023 Elsevier Inc.  

## 2021-08-26 NOTE — Assessment & Plan Note (Signed)
Hydrate and monitor 

## 2021-08-27 NOTE — Assessment & Plan Note (Signed)
hgba1c acceptable, minimize simple carbs. Increase exercise as tolerated.  

## 2021-08-27 NOTE — Assessment & Plan Note (Signed)
No recent exacerbation 

## 2021-08-27 NOTE — Assessment & Plan Note (Signed)
Is managing well at this time.  ?

## 2021-08-27 NOTE — Assessment & Plan Note (Signed)
Encourage heart healthy diet such as MIND or DASH diet, increase exercise, avoid trans fats, simple carbohydrates and processed foods, consider a krill or fish or flaxseed oil cap daily.  °

## 2021-09-05 ENCOUNTER — Telehealth (INDEPENDENT_AMBULATORY_CARE_PROVIDER_SITE_OTHER): Payer: 59 | Admitting: Medical

## 2021-09-05 ENCOUNTER — Telehealth: Payer: Self-pay | Admitting: Family Medicine

## 2021-09-05 VITALS — BP 116/72 | HR 85 | Temp 97.9°F

## 2021-09-05 DIAGNOSIS — J302 Other seasonal allergic rhinitis: Secondary | ICD-10-CM

## 2021-09-05 DIAGNOSIS — R059 Cough, unspecified: Secondary | ICD-10-CM

## 2021-09-05 DIAGNOSIS — R0981 Nasal congestion: Secondary | ICD-10-CM | POA: Diagnosis not present

## 2021-09-05 DIAGNOSIS — G4734 Idiopathic sleep related nonobstructive alveolar hypoventilation: Secondary | ICD-10-CM

## 2021-09-05 MED ORDER — LEVOCETIRIZINE DIHYDROCHLORIDE 5 MG PO TABS: 5.0000 mg | ORAL_TABLET | Freq: Every evening | ORAL | 3 refills | Status: DC

## 2021-09-05 MED ORDER — DOXYCYCLINE HYCLATE 100 MG PO TABS: 100.0000 mg | ORAL_TABLET | Freq: Two times a day (BID) | ORAL | 0 refills | Status: DC

## 2021-09-05 NOTE — Telephone Encounter (Signed)
Pt had virtual with [name] this morning and was informed she may have sleep apnea. Pt would like to know if he would write a referral, as well as if her pulmonary provider could do it if it can be sent there dr. young  ?

## 2021-09-05 NOTE — Progress Notes (Addendum)
? ?  Subjective:  ? ? Patient ID: [name], female    DOB: [birth year], 56 y.o.   MRN: [id number] ? ?HPI ?Virtual Visit via Video Note ? ?I connected with [name] on 09/05/21 at  8:40 AM EDT by a video enabled telemedicine application and verified that I am speaking with the correct person using two identifiers. ? ?Location: ?Patient: home ?Provider: office ?  ?I discussed the limitations of evaluation and management by telemedicine and the availability of in person appointments. The patient expressed understanding and agreed to proceed. ? ?History of Present Illness: ? ? Pt states she had onset of mild chills, fatigue and cough that started 2 days ago. Cough is dry presently. She states her nose was very dry and stuffy at onset. Some sneezing on back of throat as well.  Pt used nasal crom and states nasal congestion improved. ? ?No sinus pain. But getting some yellow mucus from her nose. ? ?Mild achiness in various joints. ? ? ?Vitals taken. 02 sat percentage by [employer] 99% day of office visit. But reported in high 80% at night recently during illness. ? ?Pt has not done covid test yet. ? ? ?Observations/Objective: ? ?General-no acute distress, pleasant, oriented. ?Lungs- on inspection lungs appear unlabored. ?Neck- no tracheal deviation or jvd on inspection. ?Neuro- gross motor function appears intact.  ? ? ?Assessment and Plan: ?Patient Instructions  ?Recent allergic rhinitis signs and symptoms with some features of myalgia and pt expresses concern for sinus infection.  Presently not fully convinced of sinus infection.  Some concern for COVID since she does report some body aches. ? ?Prescribe Xyzal antihistamine and advised can use her Nasalcrom spray.  If she has developed sinus pressure over the weekend or chest congestion/bronchitis type symptoms then can start doxycycline antibiotic.  Making available to use only if needed.  Patient expressed understanding. ? ?Advised patient also to check to do  COVID test.  If positive would give antiviral.  Explained let us know by afternoon and recommended calling office rather than my chart message.  ? ?Follow up in 7-10 days or sooner if needed  ? ?Follow Up Instructions: ? ?  ?I discussed the assessment and treatment plan with the patient. The patient was provided an opportunity to ask questions and all were answered. The patient agreed with the plan and demonstrated an understanding of the instructions. ?  ?The patient was advised to call back or seek an in-person evaluation if the symptoms worsen or if the condition fails to improve as anticipated. ? ?Time spent with patient today was 31 minutes which consisted of chart revdiew, discussing diagnosis, work up ,treatment, answering pt question and documentation.  ? ? ?Mackie Pai, PA-C  ? ? ?Review of Systems ? ?   ?Objective:  ? Physical Exam ? ? ? ? ?   ?Assessment & Plan:  ? ? ?

## 2021-09-05 NOTE — Patient Instructions (Addendum)
Recent allergic rhinitis signs and symptoms with some features of myalgia and pt expresses concern for sinus infection.  Presently not fully convinced of sinus infection.  Some concern for COVID since she does report some body aches. ? ?Prescribe Xyzal antihistamine and advised can use her Nasalcrom spray.  If she has developed sinus pressure over the weekend or chest congestion/bronchitis type symptoms then can start doxycycline antibiotic.  Making available to use only if needed.  Patient expressed understanding. ? ?Advised patient also to check to do COVID test.  If positive would give antiviral.  Explained let us know by afternoon and recommended calling office rather than my chart message.  ? ?Follow up in 7-10 days or sooner if needed ? ? ?Pt called back requesting referral to specialist. She expressed concern during office visit about sleep apnea do to 02 sat level in 87% range at night while sleeping. ?

## 2021-09-05 NOTE — Telephone Encounter (Signed)
Opened in error

## 2021-09-05 NOTE — Telephone Encounter (Signed)
Pt states saguier informed her to tell him her results covid test was negative.  ?

## 2021-09-08 NOTE — Addendum Note (Signed)
Addended by: Anabel Halon on: 09/08/2021 06:41 PM ? ? Modules accepted: Orders ? ?

## 2021-09-08 NOTE — Telephone Encounter (Signed)
Pt states she doesn't know if she snores at night ? ? ?And she wants to know if she is able to stop taking the amitotics around day 5 or 6 and not the full 10 day course since the medication is causing her head to hurt ?

## 2021-09-09 NOTE — Telephone Encounter (Signed)
Dr.Young is with [facility]  ?

## 2021-09-09 NOTE — Telephone Encounter (Signed)
Pt notified and pt stated it was her sinuses no longer headache ?

## 2021-09-09 NOTE — Telephone Encounter (Signed)
Pt.notified

## 2021-09-09 NOTE — Telephone Encounter (Signed)
Pt states she sees Dr.Young for pulmonology and wants to know if you can send over the sleep apnea ? ? ?Pt wants to know if she can stop the antibiotic on day 5 or 7 since she is starting to feel better ?

## 2021-09-10 NOTE — Telephone Encounter (Signed)
Pt.notified

## 2021-09-24 NOTE — Progress Notes (Signed)
HPI: FU CP and syncope.  ABIs May 2017 normal.  Monitor October 2017 showed sinus rhythm.  Also seen for syncopal episode in October 2017.  MRI negative.  EEG normal.  Seen by neurology and felt to have postconcussive syndrome. Exercise treadmill October 2018 was negative. Echocardiogram March 2019 showed normal LV systolic function.  Calcium score January 2022 0.  Renal Dopplers September 2022 showed no renal artery stenosis.  Lower extremity venous Dopplers October 2022 showed no DVT. Since last seen, the patient has dyspnea with more extreme activities but not with routine activities. It is relieved with rest. It is not associated with chest pain. There is no orthopnea, PND or pedal edema. There is no syncope or palpitations. There is no exertional chest pain.   Current Outpatient Medications  Medication Sig Dispense Refill   albuterol (PROVENTIL) (2.5 MG/3ML) 0.083% nebulizer solution Take 3 mLs (2.5 mg total) by nebulization every 6 (six) hours as needed for wheezing or shortness of breath. 75 mL 12   albuterol (VENTOLIN HFA) 108 (90 Base) MCG/ACT inhaler Inhale 2 puffs into the lungs every 6 (six) hours as needed for wheezing or shortness of breath. 18 g 1   cromolyn (NASALCROM) 5.2 MG/ACT nasal spray Place 1 spray into both nostrils 4 (four) times daily. 26 mL 12   fluticasone (FLONASE) 50 MCG/ACT nasal spray Place 2 sprays into both nostrils daily. 16 g 0   Multiple Vitamins-Minerals (MULTIVITAMIN ADULT PO) Take 1 tablet by mouth daily.     Nebulizers (PORTABLE COMPRESSOR NEBULIZER) MISC Use as directed 1 each PRN   neomycin-polymyxin-hydrocortisone (CORTISPORIN) OTIC solution Place 3 drops into the right ear 3 (three) times daily. 10 mL 0   OVER THE COUNTER MEDICATION Calcium     OVER THE COUNTER MEDICATION Omega 3 2100     Probiotic Product (PROBIOTIC DAILY PO) Take 1 tablet by mouth daily.     sodium chloride (OCEAN) 0.65 % SOLN nasal spray Place 1 spray into both nostrils as  needed for congestion.     No current facility-administered medications for this visit.     Past Medical History:  Diagnosis Date   Allergic rhinitis    Anemia    Anxiety    Asthma    Contact dermatitis and eczema    Diverticulitis    CT Scan   Esophageal reflux    H. pylori infection    Hyperlipidemia, mixed    IBS (irritable bowel syndrome)    IC (interstitial cystitis)    Internal hemorrhoids    Lumbago    Mitral valve disorders(424.0)    Osteoarthritis 07/08/2015   Post concussive syndrome    Seasonal and perennial allergic rhinitis 01/20/2015    Past Surgical History:  Procedure Laterality Date   BARTHOLIN CYST MARSUPIALIZATION N/A 12/18/2013   Procedure: BARTHOLIN CYST MARSUPIALIZATION WITH BIOPSY;  Surgeon: Lovenia Kim, MD;  Location: [facility];  Service: Gynecology;  Laterality: N/A;   CESAREAN SECTION     DILITATION & CURRETTAGE/HYSTROSCOPY WITH NOVASURE ABLATION N/A 10/14/2012   Procedure: DILATATION & CURETTAGE/HYSTEROSCOPY WITH NOVASURE ABLATION;  Surgeon: Lovenia Kim, MD;  Location: [facility];  Service: Gynecology;  Laterality: N/A;   MOUTH SURGERY     SINUS SURGERY WITH INSTATRAK      Social History   Socioeconomic History   Marital status: Married    Spouse name: Not on file   Number of children: 2   Years of education: Masters   Highest education level:  Not on file  Occupational History   Occupation: [employer]  Tobacco Use   Smoking status: Never   Smokeless tobacco: Never  Vaping Use   Vaping Use: Never used  Substance and Sexual Activity   Alcohol use: Not Currently   Drug use: No   Sexual activity: Yes    Comment: lives with husband and son, 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   Right-handed.   1 cup caffeine daily.   Social Determinants of Health   Financial Resource Strain: Not on file   Food Insecurity: Not on file  Transportation Needs: Not on file  Physical Activity: Not on file  Stress: Not on file  Social Connections: Not on file  Intimate Partner Violence: Not on file    Family History  Problem Relation Age of Onset   Diabetes Mother    Hypertension Mother    Heart attack Mother        MI at age 77   Alcohol abuse Mother        smoker   Heart disease Mother    Dementia Mother    Hypertension Father    Hyperlipidemia Father    Heart attack Father    Lung cancer Father        smoking hx; dx > 50   Colon polyps Sister    Mental illness Sister        anxiety from 9/11 survivors   Varicose Veins Sister    Osteoporosis Sister    Other Sister        hypoglycemia   Varicose Veins Sister    Rheum arthritis Sister    Pleurisy Sister    Fibromyalgia Sister    Mental retardation Sister        depression   Leukemia Brother        acute; dx 51s   Colon cancer Paternal Uncle        dx unknown age   Dementia Maternal Grandmother    Heart disease Maternal Grandfather        mi   Cancer Paternal Grandmother        breast/ovairan before age 43   GER disease Son    Alcohol abuse Other        Family history   Breast cancer Neg Hx     ROS: pain from recent broken toe but no fevers or chills, productive cough, hemoptysis, dysphasia, odynophagia, melena, hematochezia, dysuria, hematuria, rash, seizure activity, orthopnea, PND, pedal edema, claudication. Remaining systems are negative.  Physical Exam: Well-developed well-nourished in no acute distress.  Skin is warm and dry.  HEENT is normal.  Neck is supple.  Chest is clear to auscultation with normal expansion.  Cardiovascular exam is regular rate and rhythm.  Abdominal exam nontender or distended. No masses palpated. Extremities show no edema. neuro grossly intact  A/P  1 history of syncope-no recurrent episodes since last visit.  Previous episodes felt likely to be vagal in etiology.  We have  discussed the importance of continuing hydration, increasing sodium intake.  We will continue to follow.  2 question history of mitral valve prolapse-not evident on most recent echocardiogram.  3 history of chest pain-no recent symptoms.  Previous calcium score 0.  Will not pursue further evaluation at this time.  4 hyperlipidemia-LDL 142; no documented vascular disease; we  discussed importance of diet.  Kirk Ruths, MD

## 2021-09-26 ENCOUNTER — Encounter: Payer: Self-pay | Admitting: Family Medicine

## 2021-09-26 ENCOUNTER — Ambulatory Visit (HOSPITAL_BASED_OUTPATIENT_CLINIC_OR_DEPARTMENT_OTHER)
Admission: RE | Admit: 2021-09-26 | Discharge: 2021-09-26 | Disposition: A | Discharge status: Home / Self Care | Payer: 59 | Source: Ambulatory Visit | Attending: Family Medicine | Admitting: Family Medicine

## 2021-09-26 ENCOUNTER — Ambulatory Visit (INDEPENDENT_AMBULATORY_CARE_PROVIDER_SITE_OTHER): Payer: 59 | Admitting: Family Medicine

## 2021-09-26 VITALS — BP 100/80 | HR 77 | Temp 98.6°F | Resp 16 | Ht 59.0 in | Wt 102.2 lb

## 2021-09-26 DIAGNOSIS — H9209 Otalgia, unspecified ear: Secondary | ICD-10-CM | POA: Insufficient documentation

## 2021-09-26 DIAGNOSIS — M79674 Pain in right toe(s): Secondary | ICD-10-CM | POA: Insufficient documentation

## 2021-09-26 NOTE — Progress Notes (Signed)
Subjective:   By signing my name below, I, Carylon Perches, attest that this documentation has been prepared under the direction and in the presence of Roma Schanz DO, 09/26/2021   Patient ID: [name], female    DOB: 01-07-1966, 56 y.o.   MRN: [id number]  Chief Complaint  Patient presents with   Toe Injury    Pt states she fell yesterday and would like a x-ray. Right foot, big toe. Pt states some pain with walking and movement.    HPI Patient is in today for an office visit.  Patient complains of pain at base of her right toe that is exacerbated by physical activity. She reports experiencing an injury due to a recent fall. She was putting her body weight on her toes to try and maintain her balance.  She's experienced aching pain in her hips after the fall as well. When she moves her toes she reports pain at the bottom. Right toes base of the toes hurt the rest of the hurt does not hurt. Patient is interested in customized shoe that relieves the pain. She is interested in getting an xray of her knees and hip bones. She denies current pain from in hip and knee areas.   She complains of intermittent irritation of left ear.   Past Medical History:  Diagnosis Date   Allergic rhinitis    Anemia    Anxiety    Asthma    Contact dermatitis and eczema    Diverticulitis    CT Scan   Esophageal reflux    H. pylori infection    Hyperlipidemia, mixed    IBS (irritable bowel syndrome)    IC (interstitial cystitis)    Internal hemorrhoids    Lumbago    Mitral valve disorders(424.0)    Osteoarthritis 07/08/2015   Post concussive syndrome    Seasonal and perennial allergic rhinitis 01/20/2015    Past Surgical History:  Procedure Laterality Date   BARTHOLIN CYST MARSUPIALIZATION N/A 12/18/2013   Procedure: BARTHOLIN CYST MARSUPIALIZATION WITH BIOPSY;  Surgeon: Lovenia Kim, MD;  Location: [facility];  Service: Gynecology;  Laterality: N/A;   CESAREAN SECTION     DILITATION &  CURRETTAGE/HYSTROSCOPY WITH NOVASURE ABLATION N/A 10/14/2012   Procedure: DILATATION & CURETTAGE/HYSTEROSCOPY WITH NOVASURE ABLATION;  Surgeon: Lovenia Kim, MD;  Location: [facility];  Service: Gynecology;  Laterality: N/A;   MOUTH SURGERY     SINUS SURGERY WITH INSTATRAK      Family History  Problem Relation Age of Onset   Diabetes Mother    Hypertension Mother    Heart attack Mother        MI at age 1   Alcohol abuse Mother        smoker   Heart disease Mother    Dementia Mother    Hypertension Father    Hyperlipidemia Father    Heart attack Father    Lung cancer Father        smoking hx; dx > 50   Colon polyps Sister    Mental illness Sister        anxiety from 9/11 survivors   Varicose Veins Sister    Osteoporosis Sister    Other Sister        hypoglycemia   Varicose Veins Sister    Rheum arthritis Sister    Pleurisy Sister    Fibromyalgia Sister    Mental retardation Sister        depression   Leukemia Brother  acute; dx 82s   Colon cancer Paternal Uncle        dx unknown age   Dementia Maternal Grandmother    Heart disease Maternal Grandfather        mi   Cancer Paternal Grandmother        breast/ovairan before age 77   GER disease Son    Alcohol abuse Other        Family history   Breast cancer Neg Hx     Social History   Socioeconomic History   Marital status: Married    Spouse name: Not on file   Number of children: 2   Years of education: Masters   Highest education level: Not on file  Occupational History   Occupation: Careers adviser  Tobacco Use   Smoking status: Never   Smokeless tobacco: Never  Vaping Use   Vaping Use: Never used  Substance and Sexual Activity   Alcohol use: Not Currently   Drug use: No   Sexual activity: Yes    Comment: lives with husband and son, 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   Right-handed.   1 cup caffeine daily.   Social Determinants of Health   Financial Resource Strain: Not on file  Food Insecurity: Not on file  Transportation Needs: Not on file  Physical Activity: Not on file  Stress: Not on file  Social Connections: Not on file  Intimate Partner Violence: Not on file    Outpatient Medications Prior to Visit  Medication Sig Dispense Refill   albuterol (PROVENTIL) (2.5 MG/3ML) 0.083% nebulizer solution Take 3 mLs (2.5 mg total) by nebulization every 6 (six) hours as needed for wheezing or shortness of breath. 75 mL 12   albuterol (VENTOLIN HFA) 108 (90 Base) MCG/ACT inhaler Inhale 2 puffs into the lungs every 6 (six) hours as needed for wheezing or shortness of breath. 18 g 1   cromolyn (NASALCROM) 5.2 MG/ACT nasal spray Place 1 spray into both nostrils 4 (four) times daily. 26 mL 12   fluticasone (FLONASE) 50 MCG/ACT nasal spray Place 2 sprays into both nostrils daily. 16 g 0   levocetirizine (XYZAL) 5 MG tablet Take 1 tablet (5 mg total) by mouth every evening. 30 tablet 3   Multiple Vitamins-Minerals (MULTIVITAMIN ADULT PO) Take 1 tablet by mouth daily.     Nebulizers (PORTABLE COMPRESSOR NEBULIZER) MISC Use as directed 1 each PRN   neomycin-polymyxin-hydrocortisone (CORTISPORIN) OTIC solution Place 3 drops into the right ear 3 (three) times daily. 10 mL 0   OVER THE COUNTER MEDICATION Calcium     OVER THE COUNTER MEDICATION Omega 3 2100     Probiotic Product (PROBIOTIC DAILY PO) Take 1 tablet by mouth daily.     sodium chloride (OCEAN) 0.65 % SOLN nasal spray Place 1 spray into both nostrils as needed for congestion.     doxycycline (VIBRA-TABS) 100 MG tablet Take 1 tablet (100 mg total) by mouth 2 (two) times daily. (Patient not taking: Reported on 09/26/2021) 20 tablet 0   No facility-administered medications prior to visit.    Allergies  Allergen Reactions   Amoxicillin Shortness Of Breath    Doesn't remember rxn   Aspirin  Shortness Of Breath    "irritated stomach" Ibuprofen and Naproxen do not agree with her either   Codeine Hives, Shortness Of  Breath and Other (See Comments)    Does not take percocet or hydrocodone; tylenol only   Latex Shortness Of Breath, Itching and Other (See Comments)   Other Shortness Of Breath and Other (See Comments)    peanuts   Sulfa Antibiotics Hives and Shortness Of Breath   Sulfamethoxazole-Trimethoprim Shortness Of Breath   Sulfonamide Derivatives Shortness Of Breath and Itching   Sulphadimidine Sodium [Sulfamethazine Sodium] Shortness Of Breath    Added to food for preservative   Ciprofloxacin Other (See Comments)    REACTION: SOB, "Tightness in head" REACTION: SOB, "Tightness in head" Syncope    Azithromycin Other (See Comments)    Faintness   Carisoprodol    Clarithromycin     "Doesn't agree with me"   Epinephrine     Heart racing   Fenofibrate Other (See Comments)    Leg cramps, body aches    Metronidazole Other (See Comments)    "cannot tolerate"   Nsaids    Penicillins     Doesn't remember reaction   Tolmetin    Omeprazole Palpitations    Review of Systems  Constitutional:  Negative for fever and malaise/fatigue.  HENT:  Positive for ear pain (Left Ear). Negative for congestion.   Eyes:  Negative for blurred vision.  Respiratory:  Negative for cough and shortness of breath.   Cardiovascular:  Negative for chest pain, palpitations and leg swelling.  Gastrointestinal:  Negative for vomiting.  Musculoskeletal:  Negative for back pain and joint pain (Hip and Knee).       (+) Right toe pain  Skin:  Negative for rash.  Neurological:  Negative for loss of consciousness and headaches.      Objective:    Physical Exam Vitals and nursing note reviewed.  Constitutional:      Appearance: Normal appearance.  HENT:     Head: Normocephalic and atraumatic.     Right Ear: External ear normal.     Left Ear: External ear normal.  Eyes:     Extraocular  Movements: Extraocular movements intact.     Pupils: Pupils are equal, round, and reactive to light.  Cardiovascular:     Rate and Rhythm: Normal rate and regular rhythm.     Heart sounds: Normal heart sounds. No murmur heard.   No gallop.  Pulmonary:     Effort: Pulmonary effort is normal. No respiratory distress.     Breath sounds: Normal breath sounds. No wheezing or rales.  Musculoskeletal:     Comments: Toe pain R foot --- + bruising top and bottom of toe  Min tenderness to touch  Skin:    General: Skin is warm and dry.  Neurological:     Mental Status: She is alert and oriented to person, place, and time.  Psychiatric:        Judgment: Judgment normal.    BP 100/80 (BP Location: Right Arm, Patient Position: Sitting, Cuff Size: Small)   Pulse 77   Temp 98.6 F (37 C) (Oral)   Resp 16   Ht '4\' 11"'$  (1.499 m)   Wt 102 lb 3.2 oz (46.4 kg)   LMP 06/30/2016   SpO2 97%   BMI 20.64 kg/m  Wt Readings from Last 3 Encounters:  09/26/21 102 lb 3.2 oz (46.4 kg)  07/25/21 107 lb 3.2 oz (48.6 kg)  05/12/21 105 lb (47.6 kg)    Diabetic Foot Exam - Simple   No data filed    Lab Results  Component Value Date   WBC  3.5 (L) 05/20/2021   HGB 13.4 05/20/2021   HCT 40.3 05/20/2021   PLT 272.0 05/20/2021   GLUCOSE 80 05/20/2021   CHOL 220 (H) 05/20/2021   TRIG 125.0 05/20/2021   HDL 52.80 05/20/2021   LDLDIRECT 130.0 01/10/2021   LDLCALC 142 (H) 05/20/2021   ALT 13 05/20/2021   AST 16 05/20/2021   NA 139 05/20/2021   K 3.8 05/20/2021   CL 101 05/20/2021   CREATININE 0.62 05/20/2021   BUN 11 05/20/2021   CO2 30 05/20/2021   TSH 2.35 05/20/2021   HGBA1C 5.1 05/20/2021    Lab Results  Component Value Date   TSH 2.35 05/20/2021   Lab Results  Component Value Date   WBC 3.5 (L) 05/20/2021   HGB 13.4 05/20/2021   HCT 40.3 05/20/2021   MCV 98.2 05/20/2021   PLT 272.0 05/20/2021   Lab Results  Component Value Date   NA 139 05/20/2021   K 3.8 05/20/2021   CO2 30  05/20/2021   GLUCOSE 80 05/20/2021   BUN 11 05/20/2021   CREATININE 0.62 05/20/2021   BILITOT 1.1 05/20/2021   ALKPHOS 100 05/20/2021   AST 16 05/20/2021   ALT 13 05/20/2021   PROT 6.9 05/20/2021   ALBUMIN 4.4 05/20/2021   CALCIUM 9.5 05/20/2021   ANIONGAP 10 01/09/2019   GFR 99.79 05/20/2021   Lab Results  Component Value Date   CHOL 220 (H) 05/20/2021   Lab Results  Component Value Date   HDL 52.80 05/20/2021   Lab Results  Component Value Date   LDLCALC 142 (H) 05/20/2021   Lab Results  Component Value Date   TRIG 125.0 05/20/2021   Lab Results  Component Value Date   CHOLHDL 4 05/20/2021   Lab Results  Component Value Date   HGBA1C 5.1 05/20/2021       Assessment & Plan:   Problem List Items Addressed This Visit       Unprioritized   Pain of toe of right foot - Primary    Xray today-- suspect fracture Post op shoe Buddy taper 1st and 2nd toe together        Relevant Orders   DG Toe Great Right   Otalgia    Ear normal F/u pcp if no improvement          No orders of the defined types were placed in this encounter.   IAnn Held, DO, personally preformed the services described in this documentation.  All medical record entries made by the scribe were at my direction and in my presence.  I have reviewed the chart and discharge instructions (if applicable) and agree that the record reflects my personal performance and is accurate and complete. 09/26/2021   I,Amber Collins,acting as a scribe for Ann Held, DO.,have documented all relevant documentation on the behalf of Ann Held, DO,as directed by  Ann Held, DO while in the presence of Ann Held, DO.    Ann Held, DO

## 2021-09-26 NOTE — Assessment & Plan Note (Signed)
Xray today-- suspect fracture Post op shoe Buddy taper 1st and 2nd toe together

## 2021-09-26 NOTE — Assessment & Plan Note (Signed)
Ear normal F/u pcp if no improvement

## 2021-09-26 NOTE — Patient Instructions (Signed)
Toe Fracture A toe fracture is a break in one of the toe bones (phalanges). A toe fracture may be: A crack in the surface of the bone (stress fracture). This often occurs in athletes. A break all the way through the bone (complete fracture). What are the causes? This condition may be caused by: Direct impact, such as from dropping a heavy object on your toe. Stubbing your toe. Twisting or stretching your toe out of place. Overuse or repetitive exercise. What increases the risk? You are more likely to develop this condition if you: Play contact sports. Have a condition that causes the bones to become thin and brittle (osteoporosis). Have a low calcium level. What are the signs or symptoms? The main symptoms of this condition are swelling and pain in the toe. Other symptoms include: Bruising. Stiffness. Numbness. A change in the way the toe looks. Broken bones that poke through the skin. Blood beneath the toenail. How is this diagnosed? This condition is diagnosed with a physical exam. You may also have X-rays. How is this treated? Treatment for this condition depends on the type of fracture and its severity. Treatment may include: Taping the broken toe to a toe that is next to it (buddy taping). This is the most common treatment for fractures in which the bone has not moved out of place (non-displaced fracture). Wearing a shoe that has a wide, rigid sole to protect the toe and to limit its movement. Wearing a walking cast. Having a procedure to move the toe back into place. Surgery. This may be needed if the: Pieces of broken bone are out of place (displaced). Bone breaks through the skin. Physical therapy. This is done to help regain movement and strength in the toe. You may need follow-up X-rays to make sure that the bone is healing well and staying in position. Follow these instructions at home: If you have a shoe: Wear the shoe as told by your health care provider. Remove it  only as told by your health care provider. Loosen the shoe if your toes tingle, become numb, or turn cold and blue. Keep the shoe clean and dry. If you have a cast: Do not put pressure on any part of the cast until it is fully hardened. This may take several hours. Do not stick anything inside the cast to scratch your skin. Doing that increases your risk of infection. Check the skin around the cast every day. Tell your health care provider about any concerns. You may put lotion on dry skin around the edges of the cast. Do not put lotion on the skin underneath the cast. Keep the cast clean and dry. Bathing Do not take baths, swim, or use a hot tub until your health care provider approves. Ask your health care provider if you can take showers. If the shoe or cast is not waterproof: Do not let it get wet. Cover it with a watertight covering when you take a bath or a shower. Activity Do not use the injured foot to support your body weight until your health care provider says that you can. Use crutches as directed. Ask your health care provider: What activities are safe for you during recovery. What activities you need to avoid. Do physical therapy exercises as directed. Driving Do not drive or use heavy machinery while taking pain medicine. Do not drive while wearing a cast on a foot that you use for driving. Managing pain, stiffness, and swelling  If directed, put ice on painful   areas: Put ice in a plastic bag. Place a towel between your skin and the bag. If you have a shoe, remove it as told by your health care provider. If you have a cast, place a towel between your cast and the bag. Leave the ice on for 20 minutes, 2-3 times per day. Raise (elevate) the injured area above the level of your heart while you are sitting or lying down. General instructions If your toe was treated with buddy taping, follow your health care provider's instructions for changing the gauze and tape. Change it  more often if: The gauze and tape get wet. If this happens, dry the space between the toes. The gauze and tape are too tight and cause your toe to become pale or numb. If you were not given a protective shoe, wear sturdy, supportive shoes. Your shoes should not pinch your toes and should not fit tightly against your toes. Do not use any products that contain nicotine or tobacco, such as cigarettes and e-cigarettes. These can delay bone healing. If you need help quitting, ask your health care provider. Take over-the-counter and prescription medicines only as told by your health care provider. Keep all follow-up visits as told by your health care provider. This is important. Contact a health care provider if you have: Pain that gets worse or does not get better with medicine. A fever. A bad smell coming from your cast. Get help right away if you have: Any of the following in your toes or your foot: Numbness that gets worse. Tingling. Coldness. Blue skin. Redness or swelling that gets worse. Pain that suddenly becomes severe. Summary A toe fracture is a break in one of the toe bones (phalanges). Treatment depends on how severe your fracture is and how the pieces of the broken bone line up with each other. Treatment may include buddy taping, wearing a shoe or a cast, or using crutches. Ice and elevate your foot to help lessen the pain and swelling. This information is not intended to replace advice given to you by your health care provider. Make sure you discuss any questions you have with your health care provider. Document Revised: 09/16/2020 Document Reviewed: 09/16/2020 Elsevier Patient Education  2023 [employer]  

## 2021-09-29 ENCOUNTER — Telehealth: Payer: Self-pay | Admitting: Family Medicine

## 2021-09-29 ENCOUNTER — Other Ambulatory Visit: Payer: Self-pay | Admitting: Family Medicine

## 2021-09-29 ENCOUNTER — Telehealth: Payer: Self-pay | Admitting: *Deleted

## 2021-09-29 ENCOUNTER — Telehealth: Payer: Self-pay

## 2021-09-29 DIAGNOSIS — S92424A Nondisplaced fracture of distal phalanx of right great toe, initial encounter for closed fracture: Secondary | ICD-10-CM

## 2021-09-29 NOTE — Telephone Encounter (Signed)
Patient saw Dr. Etter Sjogren on 6/2 and would like her work from home note to be extended to 4-6 weeks based on what she read online regarding her toe fracture. She would also like to know if she is needing to have physical therapy for her toe since that was also recommended online. Patient also stated that per her visit with [facility], she might need an ortho referral and would like to know if it can go ahead and be sent. She also stated that her rib is sore and didn't know if she needs to talk about it with ortho. Patient was advised to make a follow appointment so all of this can be discussed but patient declined and said she already had one last week. Please advise.

## 2021-09-29 NOTE — Telephone Encounter (Signed)
[facility] to make sure we got xray report.  Report is in epic, from 09/26/21, toe xray.

## 2021-09-29 NOTE — Telephone Encounter (Signed)
Pt called in to get an extension on her leave absence. Pt would like it extended for 4-6 weeks instead of the 2 weeks.

## 2021-09-30 NOTE — Telephone Encounter (Signed)
I meant to send this to you yesterday. Please advise

## 2021-10-01 ENCOUNTER — Ambulatory Visit: Payer: 59 | Admitting: Cardiology

## 2021-10-01 ENCOUNTER — Encounter: Payer: Self-pay | Admitting: Cardiology

## 2021-10-01 ENCOUNTER — Telehealth: Payer: Self-pay | Admitting: Family Medicine

## 2021-10-01 VITALS — BP 104/60 | HR 87 | Ht 59.0 in | Wt 104.0 lb

## 2021-10-01 DIAGNOSIS — R55 Syncope and collapse: Secondary | ICD-10-CM

## 2021-10-01 DIAGNOSIS — I059 Rheumatic mitral valve disease, unspecified: Secondary | ICD-10-CM

## 2021-10-01 DIAGNOSIS — E785 Hyperlipidemia, unspecified: Secondary | ICD-10-CM

## 2021-10-01 NOTE — Telephone Encounter (Signed)
Forms faxed into front office Placed in blyth bin up front 

## 2021-10-01 NOTE — Telephone Encounter (Signed)
You saw patient on 06/02 for a fractured toe. You originally had patient working from home for 2 weeks. Pt is wanting to extend the leave from work to 4 to 6 weeks

## 2021-10-01 NOTE — Patient Instructions (Signed)

## 2021-10-01 NOTE — Telephone Encounter (Signed)
Letter placed at the front per pt request

## 2021-10-02 ENCOUNTER — Ambulatory Visit (HOSPITAL_BASED_OUTPATIENT_CLINIC_OR_DEPARTMENT_OTHER): Payer: 59 | Admitting: Orthopaedic Surgery

## 2021-10-02 DIAGNOSIS — S92414A Nondisplaced fracture of proximal phalanx of right great toe, initial encounter for closed fracture: Secondary | ICD-10-CM | POA: Diagnosis not present

## 2021-10-02 NOTE — Progress Notes (Signed)
Chief Complaint: First toe fracture     History of Present Illness:    [name] is a 56 y.o. female presents today with a right first great toe fracture after she had a fall 1 week prior where she slipped on the laminated piece of paper.  Since that time she has had throbbing pain in the right great toe.  She has been buddy taping and taking Tylenol as needed.  She does experience some bruising particular about the second toe.  She is here today for further assessment.  She went to her primary care doctor and was told that she had a fracture involving the first toe.  She has been weightbearing as tolerated without difficulty    Surgical History:   None  PMH/PSH/Family History/Social History/Meds/Allergies:    Past Medical History:  Diagnosis Date   Allergic rhinitis    Anemia    Anxiety    Asthma    Contact dermatitis and eczema    Diverticulitis    CT Scan   Esophageal reflux    H. pylori infection    Hyperlipidemia, mixed    IBS (irritable bowel syndrome)    IC (interstitial cystitis)    Internal hemorrhoids    Lumbago    Mitral valve disorders(424.0)    Osteoarthritis 07/08/2015   Post concussive syndrome    Seasonal and perennial allergic rhinitis 01/20/2015   Past Surgical History:  Procedure Laterality Date   BARTHOLIN CYST MARSUPIALIZATION N/A 12/18/2013   Procedure: BARTHOLIN CYST MARSUPIALIZATION WITH BIOPSY;  Surgeon: Lovenia Kim, MD;  Location: [facility];  Service: Gynecology;  Laterality: N/A;   CESAREAN SECTION     DILITATION & CURRETTAGE/HYSTROSCOPY WITH NOVASURE ABLATION N/A 10/14/2012   Procedure: DILATATION & CURETTAGE/HYSTEROSCOPY WITH NOVASURE ABLATION;  Surgeon: Lovenia Kim, MD;  Location: [facility];  Service: Gynecology;  Laterality: N/A;   MOUTH SURGERY     SINUS SURGERY WITH INSTATRAK     Social History   Socioeconomic History   Marital status: Married    Spouse name: Not on file   Number of  children: 2   Years of education: Masters   Highest education level: Not on file  Occupational History   Occupation: Careers adviser  Tobacco Use   Smoking status: Never   Smokeless tobacco: Never  Vaping Use   Vaping Use: Never used  Substance and Sexual Activity   Alcohol use: Not Currently   Drug use: No   Sexual activity: Yes    Comment: lives with husband and son, 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   Right-handed.   1 cup caffeine daily.   Social Determinants of Health   Financial Resource Strain: Not on file  Food Insecurity: Not on file  Transportation Needs: Not on file  Physical Activity: Not on file  Stress: Not on file  Social Connections: Not on file   Family History  Problem Relation Age of Onset   Diabetes Mother    Hypertension Mother    Heart attack Mother        MI at age 78   Alcohol abuse Mother        smoker  Heart disease Mother    Dementia Mother    Hypertension Father    Hyperlipidemia Father    Heart attack Father    Lung cancer Father        smoking hx; dx > 40   Colon polyps Sister    Mental illness Sister        anxiety from 9/11 survivors   Varicose Veins Sister    Osteoporosis Sister    Other Sister        hypoglycemia   Varicose Veins Sister    Rheum arthritis Sister    Pleurisy Sister    Fibromyalgia Sister    Mental retardation Sister        depression   Leukemia Brother        acute; dx 58s   Colon cancer Paternal Uncle        dx unknown age   Dementia Maternal Grandmother    Heart disease Maternal Grandfather        mi   Cancer Paternal Grandmother        breast/ovairan before age 16   GER disease Son    Alcohol abuse Other        Family history   Breast cancer Neg Hx    Allergies  Allergen Reactions   Amoxicillin Shortness Of Breath    Doesn't remember rxn   Aspirin  Shortness Of Breath    "irritated stomach" Ibuprofen and Naproxen do not agree with her either   Codeine Hives, Shortness Of Breath and Other (See Comments)    Does not take percocet or hydrocodone; tylenol only   Latex Shortness Of Breath, Itching and Other (See Comments)   Other Shortness Of Breath and Other (See Comments)    peanuts   Sulfa Antibiotics Hives and Shortness Of Breath   Sulfamethoxazole-Trimethoprim Shortness Of Breath   Sulfonamide Derivatives Shortness Of Breath and Itching   Sulphadimidine Sodium [Sulfamethazine Sodium] Shortness Of Breath    Added to food for preservative   Ciprofloxacin Other (See Comments)    REACTION: SOB, "Tightness in head" REACTION: SOB, "Tightness in head" Syncope    Azithromycin Other (See Comments)    Faintness   Carisoprodol    Clarithromycin     "Doesn't agree with me"   Epinephrine     Heart racing   Fenofibrate Other (See Comments)    Leg cramps, body aches    Metronidazole Other (See Comments)    "cannot tolerate"   Nsaids    Penicillins     Doesn't remember reaction   Tolmetin    Omeprazole Palpitations   Current Outpatient Medications  Medication Sig Dispense Refill   albuterol (PROVENTIL) (2.5 MG/3ML) 0.083% nebulizer solution Take 3 mLs (2.5 mg total) by nebulization every 6 (six) hours as needed for wheezing or shortness of breath. 75 mL 12   albuterol (VENTOLIN HFA) 108 (90 Base) MCG/ACT inhaler Inhale 2 puffs into the lungs every 6 (six) hours as needed for wheezing or shortness of breath. 18 g 1   cromolyn (NASALCROM) 5.2 MG/ACT nasal spray Place 1 spray into both nostrils 4 (four) times daily. 26 mL 12   fluticasone (FLONASE) 50 MCG/ACT nasal spray Place 2 sprays into both nostrils daily. 16 g 0   Multiple Vitamins-Minerals (MULTIVITAMIN ADULT PO) Take 1 tablet by mouth daily.     Nebulizers (PORTABLE COMPRESSOR NEBULIZER) MISC Use as directed 1 each PRN   neomycin-polymyxin-hydrocortisone (CORTISPORIN) OTIC  solution Place 3 drops into the right ear 3 (three)  times daily. 10 mL 0   OVER THE COUNTER MEDICATION Calcium     OVER THE COUNTER MEDICATION Omega 3 2100     Probiotic Product (PROBIOTIC DAILY PO) Take 1 tablet by mouth daily.     sodium chloride (OCEAN) 0.65 % SOLN nasal spray Place 1 spray into both nostrils as needed for congestion.     No current facility-administered medications for this visit.   No results found.  Review of Systems:   A ROS was performed including pertinent positives and negatives as documented in the HPI.  Physical Exam :   Constitutional: NAD and appears stated age Neurological: Alert and oriented Psych: Appropriate affect and cooperative Last menstrual period 06/30/2016.   Comprehensive Musculoskeletal Exam:    Tenderness palpation about the right first toe as well as the right second toe with some bruising.  Otherwise painless range of motion about the midfoot and ankle.  Neurosensory exam is intact  Imaging:   Xray (right great toe 3 views): Nondisplaced first proximal phalanx base fracture    I personally reviewed and interpreted the radiographs.   Assessment:   56 y.o. female with right volar plate avulsion off of the proximal phalanx of the right great toe as well as a volar plate sprain of the second toe.  At this time she may be activity as tolerated.  At this time I would just treat this symptomatically.  She will follow-up as needed  Plan :    -Return to clinic as needed     I personally saw and evaluated the patient, and participated in the management and treatment plan.  Vanetta Mulders, MD Attending Physician, Orthopedic Surgery  This document was dictated using Dragon voice recognition software. A reasonable attempt at proof reading has been made to minimize errors.

## 2021-10-15 NOTE — Telephone Encounter (Signed)
FMLA forms completed for work from home accommodations and intermittent leave for ortho appointments.

## 2021-10-23 ENCOUNTER — Telehealth: Payer: Self-pay | Admitting: Family Medicine

## 2021-10-23 NOTE — Telephone Encounter (Signed)
Pt stated she thought she was supposed to have labs done. Also she would like a copy of her fmla paper mailed. Please advise.

## 2021-10-27 NOTE — Telephone Encounter (Signed)
Copy mailed.

## 2021-10-29 ENCOUNTER — Telehealth: Payer: Self-pay | Admitting: Family Medicine

## 2021-10-29 NOTE — Telephone Encounter (Signed)
Pt will be seeing her physical therapist until the end of July and would like to work from home until then. She asked if her fmla could be extended till the 31st.

## 2021-10-31 ENCOUNTER — Telehealth: Payer: Self-pay | Admitting: Orthopaedic Surgery

## 2021-10-31 ENCOUNTER — Encounter (HOSPITAL_BASED_OUTPATIENT_CLINIC_OR_DEPARTMENT_OTHER): Payer: Self-pay | Admitting: Orthopaedic Surgery

## 2021-10-31 ENCOUNTER — Encounter (HOSPITAL_BASED_OUTPATIENT_CLINIC_OR_DEPARTMENT_OTHER): Payer: Self-pay

## 2021-10-31 NOTE — Telephone Encounter (Signed)
Patient states she is needing a new letter for her job asking for an extension to work from home til the end of July until she is reevaluated again for PT. Would also like to know if she needs to come back for another xray to check and see if she is healing correctly, and if so when she would need to come back. She would like to know if the letter could be uploaded to mychart if possible or emailed to her. CB # [id number]

## 2021-10-31 NOTE — Telephone Encounter (Signed)
Work note uploaded to [employer] and responded to patient's question on MyChart message labeled "Work Note"

## 2021-11-03 NOTE — Telephone Encounter (Signed)
Forms with end date, Aug 31st mailed.

## 2021-11-04 ENCOUNTER — Telehealth: Payer: Self-pay | Admitting: Orthopaedic Surgery

## 2021-11-04 NOTE — Telephone Encounter (Signed)
Pt called requesting an updated work note. Pt states she need note to say work from home until end of month due to broken toes and limited driving. Also add that when return to work only work 2 days of of the week and remaining days work from home. Please call pt if any questions. Pt phone number is [phone]. Please send to pt's mychart

## 2021-11-05 ENCOUNTER — Encounter (HOSPITAL_BASED_OUTPATIENT_CLINIC_OR_DEPARTMENT_OTHER): Payer: Self-pay

## 2021-11-05 ENCOUNTER — Telehealth: Payer: Self-pay | Admitting: Family Medicine

## 2021-11-05 NOTE — Telephone Encounter (Signed)
Pt states on page 4 of accomodation papers, for intermittent leave it can be up to a year so she would like it changed to May 2024 and faxed back. Please advise.

## 2021-11-05 NOTE — Telephone Encounter (Signed)
Responded via MyChart Dr. Eddie Dibbles response

## 2021-11-06 NOTE — Telephone Encounter (Signed)
Form adjusted and faxed.

## 2021-11-10 ENCOUNTER — Telehealth: Payer: Self-pay | Admitting: Family Medicine

## 2021-11-10 DIAGNOSIS — E785 Hyperlipidemia, unspecified: Secondary | ICD-10-CM

## 2021-11-10 DIAGNOSIS — D649 Anemia, unspecified: Secondary | ICD-10-CM

## 2021-11-10 DIAGNOSIS — K219 Gastro-esophageal reflux disease without esophagitis: Secondary | ICD-10-CM

## 2021-11-10 NOTE — Telephone Encounter (Signed)
Pt called stating that she was under the impression that she was supposed to have lab work done in reference to her upcoming follow up appt. After reviewing chart, did not see any active orders for lab work. Pt asked if a note could be sent back to see if lab work was needed and if so, would it need to be done before or after her appt.

## 2021-11-12 NOTE — Telephone Encounter (Signed)
Labs ordered and lab appointment made.

## 2021-11-17 ENCOUNTER — Other Ambulatory Visit (INDEPENDENT_AMBULATORY_CARE_PROVIDER_SITE_OTHER): Payer: 59

## 2021-11-17 DIAGNOSIS — E785 Hyperlipidemia, unspecified: Secondary | ICD-10-CM | POA: Diagnosis not present

## 2021-11-17 DIAGNOSIS — K219 Gastro-esophageal reflux disease without esophagitis: Secondary | ICD-10-CM

## 2021-11-17 DIAGNOSIS — D649 Anemia, unspecified: Secondary | ICD-10-CM

## 2021-11-17 LAB — CBC
HCT: 37.5 % (ref 36.0–46.0)
Hemoglobin: 13 g/dL (ref 12.0–15.0)
MCHC: 34.6 g/dL (ref 30.0–36.0)
MCV: 98.7 fl (ref 78.0–100.0)
Platelets: 248 10*3/uL (ref 150.0–400.0)
RBC: 3.8 Mil/uL — ABNORMAL LOW (ref 3.87–5.11)
RDW: 12.3 % (ref 11.5–15.5)
WBC: 3.4 10*3/uL — ABNORMAL LOW (ref 4.0–10.5)

## 2021-11-17 LAB — LIPID PANEL
Cholesterol: 201 mg/dL — ABNORMAL HIGH (ref 0–200)
HDL: 51.3 mg/dL (ref >39.00)
LDL Cholesterol: 130 mg/dL — ABNORMAL HIGH (ref 0–99)
NonHDL: 150.03
Total CHOL/HDL Ratio: 4
Triglycerides: 101 mg/dL (ref 0.0–149.0)
VLDL: 20.2 mg/dL (ref 0.0–40.0)

## 2021-11-17 LAB — COMPREHENSIVE METABOLIC PANEL
ALT: 12 U/L (ref 0–35)
AST: 16 U/L (ref 0–37)
Albumin: 4.3 g/dL (ref 3.5–5.2)
Alkaline Phosphatase: 83 U/L (ref 39–117)
BUN: 14 mg/dL (ref 6–23)
CO2: 28 mEq/L (ref 19–32)
Calcium: 8.9 mg/dL (ref 8.4–10.5)
Chloride: 101 mEq/L (ref 96–112)
Creatinine, Ser: 0.64 mg/dL (ref 0.40–1.20)
GFR: 98.69 mL/min (ref >60.00)
Glucose, Bld: 138 mg/dL — ABNORMAL HIGH (ref 70–99)
Potassium: 3.5 mEq/L (ref 3.5–5.1)
Sodium: 137 mEq/L (ref 135–145)
Total Bilirubin: 1.3 mg/dL — ABNORMAL HIGH (ref 0.2–1.2)
Total Protein: 6.6 g/dL (ref 6.0–8.3)

## 2021-11-17 LAB — TSH: TSH: 2.04 u[IU]/mL (ref 0.35–5.50)

## 2021-11-18 ENCOUNTER — Encounter: Payer: Self-pay | Admitting: Family Medicine

## 2021-11-18 ENCOUNTER — Telehealth (INDEPENDENT_AMBULATORY_CARE_PROVIDER_SITE_OTHER): Payer: 59 | Admitting: Family Medicine

## 2021-11-18 DIAGNOSIS — F419 Anxiety disorder, unspecified: Secondary | ICD-10-CM

## 2021-11-18 DIAGNOSIS — S92404D Nondisplaced unspecified fracture of right great toe, subsequent encounter for fracture with routine healing: Secondary | ICD-10-CM | POA: Diagnosis not present

## 2021-11-18 DIAGNOSIS — E785 Hyperlipidemia, unspecified: Secondary | ICD-10-CM

## 2021-11-18 DIAGNOSIS — R739 Hyperglycemia, unspecified: Secondary | ICD-10-CM | POA: Diagnosis not present

## 2021-11-18 DIAGNOSIS — F32A Depression, unspecified: Secondary | ICD-10-CM

## 2021-11-18 NOTE — Progress Notes (Signed)
MyChart Video Visit    Virtual Visit via Video Note   This visit type was conducted due to national recommendations for restrictions regarding the COVID-19 Pandemic (e.g. social distancing) in an effort to limit this patient's exposure and mitigate transmission in our community. This patient is at least at moderate risk for complications without adequate follow up. This format is felt to be most appropriate for this patient at this time. Physical exam was limited by quality of the video and audio technology used for the visit. Tomekia, CMA was able to get the patient set up on a video visit.  Patient location: Home patient and provider in visit Provider location: Office  I discussed the limitations of evaluation and management by telemedicine and the availability of in person appointments. The patient expressed understanding and agreed to proceed.  Visit Date: 11/18/2021.  Today's healthcare provider: Penni Homans, MD     Subjective:    Patient ID: [name], female    DOB: 04-05-1966, 56 y.o.   MRN: [id number]  No chief complaint on file.  HPI Patient is in today for a virtual visit.  She is requesting a note for work to allow her to temporarily park closer to her office due to her toe injury.   Refills: She is not requesting refills on any medications today.  Fall: She states that she fell and fractured her great toe half way through and this also causes a hairline fracture on her second toe. She was unable to exercise for 2 months to her injuries. She is currently undergoing physical therapy to manage her toe injuries.  She reports that her toes become stiff and harden. She suspects arthritis and states that she experiences pain in her leg. She reports that she feels a heated sensation and stiffness in her neck, arms, legs and hands.   Blood sugar: She complains that her blood sugar was elevated. She states that she feels sleepy after consuming carbohydrates and also  experiences a slight pain on her side, close to the pancreas.  Lab Results  Component Value Date   HGBA1C 5.1 05/20/2021   RBC and WBC: She states that her red blood cell count and white cell count were low.   Interstitial cystitis: She reports that she was diagnosed with interstitial cystitis. She reports that she does find trace amounts of blood in her urine. She as on and off pain and scratchiness when she urinates.   Diet: She reports that she has not been eating well after her toe injuries. She states that she is taking probiotics daily.  Past Medical History:  Diagnosis Date   Allergic rhinitis    Anemia    Anxiety    Asthma    Contact dermatitis and eczema    Diverticulitis    CT Scan   Esophageal reflux    H. pylori infection    Hyperlipidemia, mixed    IBS (irritable bowel syndrome)    IC (interstitial cystitis)    Internal hemorrhoids    Lumbago    Mitral valve disorders(424.0)    Osteoarthritis 07/08/2015   Post concussive syndrome    Seasonal and perennial allergic rhinitis 01/20/2015   Past Surgical History:  Procedure Laterality Date   BARTHOLIN CYST MARSUPIALIZATION N/A 12/18/2013   Procedure: BARTHOLIN CYST MARSUPIALIZATION WITH BIOPSY;  Surgeon: Lovenia Kim, MD;  Location: [facility];  Service: Gynecology;  Laterality: N/A;   CESAREAN SECTION     DILITATION & CURRETTAGE/HYSTROSCOPY WITH NOVASURE ABLATION N/A 10/14/2012  Procedure: DILATATION & CURETTAGE/HYSTEROSCOPY WITH NOVASURE ABLATION;  Surgeon: Lovenia Kim, MD;  Location: [facility];  Service: Gynecology;  Laterality: N/A;   MOUTH SURGERY     SINUS SURGERY WITH INSTATRAK     Family History  Problem Relation Age of Onset   Diabetes Mother    Hypertension Mother    Heart attack Mother        MI at age 57   Alcohol abuse Mother        smoker   Heart disease Mother    Dementia Mother    Hypertension Father    Hyperlipidemia Father    Heart attack Father    Lung cancer Father        smoking hx;  dx > 71   Colon polyps Sister    Mental illness Sister        anxiety from 9/11 survivors   Varicose Veins Sister    Osteoporosis Sister    Other Sister        hypoglycemia   Varicose Veins Sister    Rheum arthritis Sister    Pleurisy Sister    Fibromyalgia Sister    Mental retardation Sister        depression   Leukemia Brother        acute; dx 11s   Colon cancer Paternal Uncle        dx unknown age   Dementia Maternal Grandmother    Heart disease Maternal Grandfather        mi   Cancer Paternal Grandmother        breast/ovairan before age 60   GER disease Son    Alcohol abuse Other        Family history   Breast cancer Neg Hx    Social History   Socioeconomic History   Marital status: Married    Spouse name: Not on file   Number of children: 2   Years of education: Masters   Highest education level: Not on file  Occupational History   Occupation: Careers adviser  Tobacco Use   Smoking status: Never   Smokeless tobacco: Never  Vaping Use   Vaping Use: Never used  Substance and Sexual Activity   Alcohol use: Not Currently   Drug use: No   Sexual activity: Yes    Comment: lives with husband and son, 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   Right-handed.   1 cup caffeine daily.   Social Determinants of Health   Financial Resource Strain: Not on file  Food Insecurity: Not on file  Transportation Needs: Not on file  Physical Activity: Not on file  Stress: Not on file  Social Connections: Not on file  Intimate Partner Violence: Not on file   Outpatient Medications Prior to Visit  Medication Sig Dispense Refill   albuterol (PROVENTIL) (2.5 MG/3ML) 0.083% nebulizer solution Take 3 mLs (2.5 mg total) by nebulization every 6 (six) hours as needed for wheezing or shortness of breath. 75 mL 12   albuterol (VENTOLIN HFA) 108 (90  Base) MCG/ACT inhaler Inhale 2 puffs into the lungs every 6 (six) hours as needed for wheezing or shortness of breath. 18 g 1   cromolyn (NASALCROM) 5.2 MG/ACT nasal spray Place 1 spray into both nostrils 4 (four) times daily. 26 mL 12   fluticasone (FLONASE) 50  MCG/ACT nasal spray Place 2 sprays into both nostrils daily. 16 g 0   Multiple Vitamins-Minerals (MULTIVITAMIN ADULT PO) Take 1 tablet by mouth daily.     Nebulizers (PORTABLE COMPRESSOR NEBULIZER) MISC Use as directed 1 each PRN   neomycin-polymyxin-hydrocortisone (CORTISPORIN) OTIC solution Place 3 drops into the right ear 3 (three) times daily. 10 mL 0   OVER THE COUNTER MEDICATION Calcium     OVER THE COUNTER MEDICATION Omega 3 2100     Probiotic Product (PROBIOTIC DAILY PO) Take 1 tablet by mouth daily.     sodium chloride (OCEAN) 0.65 % SOLN nasal spray Place 1 spray into both nostrils as needed for congestion.     No facility-administered medications prior to visit.   Allergies  Allergen Reactions   Amoxicillin Shortness Of Breath    Doesn't remember rxn   Aspirin Shortness Of Breath    "irritated stomach" Ibuprofen and Naproxen do not agree with her either   Codeine Hives, Shortness Of Breath and Other (See Comments)    Does not take percocet or hydrocodone; tylenol only   Latex Shortness Of Breath, Itching and Other (See Comments)   Other Shortness Of Breath and Other (See Comments)    peanuts   Sulfa Antibiotics Hives and Shortness Of Breath   Sulfamethoxazole-Trimethoprim Shortness Of Breath   Sulfonamide Derivatives Shortness Of Breath and Itching   Sulphadimidine Sodium [Sulfamethazine Sodium] Shortness Of Breath    Added to food for preservative   Ciprofloxacin Other (See Comments)    REACTION: SOB, "Tightness in head" REACTION: SOB, "Tightness in head" Syncope    Azithromycin Other (See Comments)    Faintness   Carisoprodol    Clarithromycin     "Doesn't agree with me"   Epinephrine     Heart racing    Fenofibrate Other (See Comments)    Leg cramps, body aches    Metronidazole Other (See Comments)    "cannot tolerate"   Nsaids    Penicillins     Doesn't remember reaction   Tolmetin    Omeprazole Palpitations   ROS    Objective:    Physical Exam  LMP 06/30/2016  Wt Readings from Last 3 Encounters:  10/01/21 104 lb 0.6 oz (47.2 kg)  09/26/21 102 lb 3.2 oz (46.4 kg)  07/25/21 107 lb 3.2 oz (48.6 kg)   Diabetic Foot Exam - Simple   No data filed    Lab Results  Component Value Date   WBC 3.4 (L) 11/17/2021   HGB 13.0 11/17/2021   HCT 37.5 11/17/2021   PLT 248.0 11/17/2021   GLUCOSE 138 (H) 11/17/2021   CHOL 201 (H) 11/17/2021   TRIG 101.0 11/17/2021   HDL 51.30 11/17/2021   LDLDIRECT 130.0 01/10/2021   LDLCALC 130 (H) 11/17/2021   ALT 12 11/17/2021   AST 16 11/17/2021   NA 137 11/17/2021   K 3.5 11/17/2021   CL 101 11/17/2021   CREATININE 0.64 11/17/2021   BUN 14 11/17/2021   CO2 28 11/17/2021   TSH 2.04 11/17/2021   HGBA1C 5.1 05/20/2021   Lab Results  Component Value Date   TSH 2.04 11/17/2021   Lab Results  Component Value Date   WBC 3.4 (L) 11/17/2021   HGB 13.0 11/17/2021   HCT 37.5 11/17/2021   MCV 98.7 11/17/2021   PLT 248.0 11/17/2021   Lab Results  Component Value Date   NA 137 11/17/2021   K 3.5 11/17/2021   CO2 28 11/17/2021   GLUCOSE 138 (  H) 11/17/2021   BUN 14 11/17/2021   CREATININE 0.64 11/17/2021   BILITOT 1.3 (H) 11/17/2021   ALKPHOS 83 11/17/2021   AST 16 11/17/2021   ALT 12 11/17/2021   PROT 6.6 11/17/2021   ALBUMIN 4.3 11/17/2021   CALCIUM 8.9 11/17/2021   ANIONGAP 10 01/09/2019   GFR 98.69 11/17/2021   Lab Results  Component Value Date   CHOL 201 (H) 11/17/2021   Lab Results  Component Value Date   HDL 51.30 11/17/2021   Lab Results  Component Value Date   LDLCALC 130 (H) 11/17/2021   Lab Results  Component Value Date   TRIG 101.0 11/17/2021   Lab Results  Component Value Date   CHOLHDL 4 11/17/2021    Lab Results  Component Value Date   HGBA1C 5.1 05/20/2021   Colonoscopy: Last completed on 07/11/2020.   Pap smear: Last completed on 07/02/2021. Results were normal.    Assessment & Plan:   Problem List Items Addressed This Visit       Other   Anxiety and depression - Primary   No orders of the defined types were placed in this encounter.  I discussed the assessment and treatment plan with the patient. The patient was provided an opportunity to ask questions and all were answered. The patient agreed with the plan and demonstrated an understanding of the instructions.   The patient was advised to call back or seek an in-person evaluation if the symptoms worsen or if the condition fails to improve as anticipated.  I provided 21 minutes of face-to-face time during this encounter.  I,Mohammed Iqbal,acting as a scribe for Penni Homans, MD.,have documented all relevant documentation on the behalf of Penni Homans, MD,as directed by  Penni Homans, MD while in the presence of Penni Homans, MD.  Penni Homans, MD [facility] [phone] (phone) [phone] (fax)  [place]

## 2021-11-19 ENCOUNTER — Other Ambulatory Visit (INDEPENDENT_AMBULATORY_CARE_PROVIDER_SITE_OTHER): Payer: 59

## 2021-11-19 DIAGNOSIS — E785 Hyperlipidemia, unspecified: Secondary | ICD-10-CM

## 2021-11-19 DIAGNOSIS — R739 Hyperglycemia, unspecified: Secondary | ICD-10-CM

## 2021-11-19 DIAGNOSIS — S92401A Displaced unspecified fracture of right great toe, initial encounter for closed fracture: Secondary | ICD-10-CM | POA: Insufficient documentation

## 2021-11-19 LAB — HEMOGLOBIN A1C: Hgb A1c MFr Bld: 5.2 % (ref 4.6–6.5)

## 2021-11-19 NOTE — Assessment & Plan Note (Signed)
hgba1c acceptable, minimize simple carbs. Increase exercise as tolerated.  

## 2021-11-19 NOTE — Assessment & Plan Note (Signed)
She broke her toe last month and it is healing but still causes her some pain. Can try topical treatments and stiff bottom shoes.

## 2021-11-19 NOTE — Assessment & Plan Note (Signed)
She has been under a great deal of stress at work but she is managing without medications at this time. She will let us know if she changes her mind

## 2021-11-19 NOTE — Assessment & Plan Note (Signed)
Encourage heart healthy diet such as MIND or DASH diet, increase exercise, avoid trans fats, simple carbohydrates and processed foods, consider a krill or fish or flaxseed oil cap daily.  °

## 2021-11-20 ENCOUNTER — Telehealth: Payer: Self-pay | Admitting: Family Medicine

## 2021-11-20 NOTE — Telephone Encounter (Signed)
She also wanted a letter sent to explaining it is recommended she continue physical therapy at [facility].

## 2021-11-20 NOTE — Telephone Encounter (Signed)
Patient is calling to request a few things from PCP:  Patient states she needs a letter from Dr. Charlett Blake stating acupuncture and massage therapy is recommended in order to help the healing of her broken toe. She would like a copy of her recent blood work printed and put upfront so she can pick it up today if possible.  Patient would also like to know if Dr. Charlett Blake wants to do further labs to check for Lupus, and other thing previously discussed on their last VV.    Please advise.

## 2021-11-20 NOTE — Telephone Encounter (Signed)
Patient also wanted to state that she chose [facility] but if Dr. Charlett Blake thinks she should go somewhere else, she will.

## 2021-11-21 NOTE — Telephone Encounter (Signed)
Patient called to follow up on the forms. Please call her once they are ready for her to pick up.

## 2021-11-24 NOTE — Telephone Encounter (Signed)
Patient came by to pick up letter, she stated she also wanted to know if Dr. Charlett Blake thinks she should get another bone density scan since she hasn't gotten one done her toe accident. Please advise.

## 2021-11-24 NOTE — Telephone Encounter (Signed)
Printed off lab work and put up front for pt to pick up.  Called pt, no answer and VM is full. Sent [employer]. Need additional information for the letters as the last note from her ortho doctor states she can go back to work the first of August. Need to know who recommended additional pt, acupuncture and massage therapy.

## 2021-11-25 NOTE — Telephone Encounter (Signed)
Patient called stating she also needs a letter stating that the lighting in her work area needs to be limited due to the post concussion symptoms she suffered years ago. She states the old letter she had expired, and they are requiring a new one. She stated she need the letter as soon as possible. Please advise.

## 2021-11-26 ENCOUNTER — Ambulatory Visit (HOSPITAL_BASED_OUTPATIENT_CLINIC_OR_DEPARTMENT_OTHER): Payer: 59 | Admitting: Orthopaedic Surgery

## 2021-11-26 NOTE — Telephone Encounter (Signed)
Pt has upcoming appointment, will address bone scan with Dr Charlett Blake at that time.

## 2021-11-26 NOTE — Telephone Encounter (Signed)
Requested letter sent to pt via mychart. Copy put up front for pt to pick up.

## 2021-12-09 NOTE — Telephone Encounter (Signed)
Did you ever do this

## 2021-12-10 NOTE — Telephone Encounter (Signed)
Spoke with patient to get a little more info for letter and she stated that she did not need this time.  She may be working from home soon.

## 2021-12-11 ENCOUNTER — Telehealth: Payer: Self-pay | Admitting: Family Medicine

## 2021-12-11 NOTE — Telephone Encounter (Signed)
Pt called requesting a referral/order for imaging on her Knee. Pt stated she had a fall a while back where her knee eventually got better but now when she exerts the knee it has a considerable amount of pain. She would like to have this done prior to her apt with Dr. Charlett Blake on 8.22.23 so the results can be discussed.

## 2021-12-12 ENCOUNTER — Other Ambulatory Visit: Payer: Self-pay

## 2021-12-12 DIAGNOSIS — R936 Abnormal findings on diagnostic imaging of limbs: Secondary | ICD-10-CM

## 2021-12-12 NOTE — Telephone Encounter (Signed)
Called pt and she stated left knee and x-ray was scheduled

## 2021-12-16 ENCOUNTER — Ambulatory Visit (HOSPITAL_BASED_OUTPATIENT_CLINIC_OR_DEPARTMENT_OTHER)
Admission: RE | Admit: 2021-12-16 | Discharge: 2021-12-16 | Disposition: A | Discharge status: Home / Self Care | Payer: 59 | Source: Ambulatory Visit | Attending: Family Medicine | Admitting: Family Medicine

## 2021-12-16 ENCOUNTER — Ambulatory Visit: Payer: 59 | Admitting: Family Medicine

## 2021-12-16 VITALS — BP 108/62 | HR 64 | Temp 98.0°F | Resp 16 | Ht 59.0 in | Wt 106.6 lb

## 2021-12-16 DIAGNOSIS — R739 Hyperglycemia, unspecified: Secondary | ICD-10-CM | POA: Diagnosis not present

## 2021-12-16 DIAGNOSIS — E785 Hyperlipidemia, unspecified: Secondary | ICD-10-CM

## 2021-12-16 DIAGNOSIS — R936 Abnormal findings on diagnostic imaging of limbs: Secondary | ICD-10-CM | POA: Insufficient documentation

## 2021-12-16 DIAGNOSIS — M25571 Pain in right ankle and joints of right foot: Secondary | ICD-10-CM | POA: Diagnosis not present

## 2021-12-16 DIAGNOSIS — K219 Gastro-esophageal reflux disease without esophagitis: Secondary | ICD-10-CM

## 2021-12-16 DIAGNOSIS — N186 End stage renal disease: Secondary | ICD-10-CM | POA: Diagnosis not present

## 2021-12-16 DIAGNOSIS — M79604 Pain in right leg: Secondary | ICD-10-CM

## 2021-12-16 NOTE — Progress Notes (Signed)
Subjective:   By signing my name below, I, [name], attest that this documentation has been prepared under the direction and in the presence of Mallory Lukes, MD 12/16/2021.    Patient ID: [name], female    DOB: 1965-07-24, 56 y.o.   MRN: [id number]  No chief complaint on file.  HPI Patient is in today for an office visit.  Tremor: She reports that she has been feeling tremors across her entire body. She states that she feels these tremors primarily during midday.   Big toe fracture: She reports that since fracturing her big toe, the pain in her foot is improving and has subsided, but not entirely. She states that her big toe will become inflamed if she runs or walks too much. She states that her right knee is protruding outwards when she walks. She is currently seeing [facility] to help manage this.   Urology: She states that she was prescribed Nitrofurantoin to help manage a scratchy sensation when urinating. She says that the medication was effective but she is now having urgency.   Supplements: She is currently taking fish oil, flaxseeds and red yeast rice.    Stress: She reports that she is still dealing with stress at work, but it is not as severe as it was several years ago.   Allergist: She states that she has seen an allergist after having an allergic reaction to a COVID-19 vaccination.   Past Medical History:  Diagnosis Date   Allergic rhinitis    Anemia    Anxiety    Asthma    Contact dermatitis and eczema    Diverticulitis    CT Scan   Esophageal reflux    H. pylori infection    Hyperlipidemia, mixed    IBS (irritable bowel syndrome)    IC (interstitial cystitis)    Internal hemorrhoids    Lumbago    Mitral valve disorders(424.0)    Osteoarthritis 07/08/2015   Post concussive syndrome    Seasonal and perennial allergic rhinitis 01/20/2015   Past Surgical History:  Procedure Laterality Date   BARTHOLIN CYST MARSUPIALIZATION N/A  12/18/2013   Procedure: BARTHOLIN CYST MARSUPIALIZATION WITH BIOPSY;  Surgeon: Lovenia Kim, MD;  Location: [facility];  Service: Gynecology;  Laterality: N/A;   CESAREAN SECTION     DILITATION & CURRETTAGE/HYSTROSCOPY WITH NOVASURE ABLATION N/A 10/14/2012   Procedure: DILATATION & CURETTAGE/HYSTEROSCOPY WITH NOVASURE ABLATION;  Surgeon: Lovenia Kim, MD;  Location: [facility];  Service: Gynecology;  Laterality: N/A;   MOUTH SURGERY     SINUS SURGERY WITH INSTATRAK     Family History  Problem Relation Age of Onset   Diabetes Mother    Hypertension Mother    Heart attack Mother        MI at age 23   Alcohol abuse Mother        smoker   Heart disease Mother    Dementia Mother    Hypertension Father    Hyperlipidemia Father    Heart attack Father    Lung cancer Father        smoking hx; dx > 50   Colon polyps Sister    Mental illness Sister        anxiety from 9/11 survivors   Varicose Veins Sister    Osteoporosis Sister    Other Sister        hypoglycemia   Varicose Veins Sister    Rheum arthritis Sister    Pleurisy Sister  Fibromyalgia Sister    Mental retardation Sister        depression   Leukemia Brother        acute; dx 35s   Colon cancer Paternal Uncle        dx unknown age   Dementia Maternal Grandmother    Heart disease Maternal Grandfather        mi   Cancer Paternal Grandmother        breast/ovairan before age 9   GER disease Son    Alcohol abuse Other        Family history   Breast cancer Neg Hx    Social History   Socioeconomic History   Marital status: Married    Spouse name: Not on file   Number of children: 2   Years of education: Masters   Highest education level: Not on file  Occupational History   Occupation: Careers adviser  Tobacco Use   Smoking status: Never   Smokeless tobacco: Never  Vaping Use   Vaping Use: Never used  Substance and Sexual Activity   Alcohol use: Not Currently   Drug use: No   Sexual activity:  Yes    Comment: lives with husband and son, 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   Right-handed.   1 cup caffeine daily.   Social Determinants of Health   Financial Resource Strain: Not on file  Food Insecurity: Not on file  Transportation Needs: Not on file  Physical Activity: Not on file  Stress: Not on file  Social Connections: Not on file  Intimate Partner Violence: Not on file   Outpatient Medications Prior to Visit  Medication Sig Dispense Refill   albuterol (PROVENTIL) (2.5 MG/3ML) 0.083% nebulizer solution Take 3 mLs (2.5 mg total) by nebulization every 6 (six) hours as needed for wheezing or shortness of breath. 75 mL 12   albuterol (VENTOLIN HFA) 108 (90 Base) MCG/ACT inhaler Inhale 2 puffs into the lungs every 6 (six) hours as needed for wheezing or shortness of breath. 18 g 1   Multiple Vitamins-Minerals (MULTIVITAMIN ADULT PO) Take 1 tablet by mouth daily.     Nebulizers (PORTABLE COMPRESSOR NEBULIZER) MISC Use as directed 1 each PRN   OVER THE COUNTER MEDICATION Calcium     OVER THE COUNTER MEDICATION Omega 3 2100     Probiotic Product (PROBIOTIC DAILY PO) Take 1 tablet by mouth daily.     sodium chloride (OCEAN) 0.65 % SOLN nasal spray Place 1 spray into both nostrils as needed for congestion.     No facility-administered medications prior to visit.   Allergies  Allergen Reactions   Amoxicillin Shortness Of Breath    Doesn't remember rxn   Aspirin Shortness Of Breath    "irritated stomach" Ibuprofen and Naproxen do not agree with her either   Codeine Hives, Shortness Of Breath and Other (See Comments)    Does not take percocet or hydrocodone; tylenol only   Latex Shortness Of Breath, Itching and Other (See Comments)   Other Shortness Of Breath and Other (See Comments)    peanuts   Sulfa Antibiotics Hives and Shortness Of Breath    Sulfamethoxazole-Trimethoprim Shortness Of Breath   Sulfonamide Derivatives Shortness Of Breath and Itching   Sulphadimidine Sodium [Sulfamethazine Sodium] Shortness Of Breath    Added to food for preservative  Ciprofloxacin Other (See Comments)    REACTION: SOB, "Tightness in head" REACTION: SOB, "Tightness in head" Syncope    Azithromycin Other (See Comments)    Faintness   Carisoprodol    Clarithromycin     "Doesn't agree with me"   Epinephrine     Heart racing   Fenofibrate Other (See Comments)    Leg cramps, body aches    Metronidazole Other (See Comments)    "cannot tolerate"   Nsaids    Penicillins     Doesn't remember reaction   Tolmetin    Omeprazole Palpitations   Review of Systems  Genitourinary:  Positive for urgency.      Objective:    Physical Exam Constitutional:      General: She is not in acute distress.    Appearance: Normal appearance. She is not ill-appearing.  HENT:     Head: Normocephalic and atraumatic.     Right Ear: External ear normal.     Left Ear: External ear normal.     Mouth/Throat:     Mouth: Mucous membranes are moist.     Pharynx: Oropharynx is clear.  Eyes:     Extraocular Movements: Extraocular movements intact.     Pupils: Pupils are equal, round, and reactive to light.  Cardiovascular:     Rate and Rhythm: Normal rate and regular rhythm.     Pulses: Normal pulses.     Heart sounds: Normal heart sounds. No murmur heard.    No gallop.  Pulmonary:     Effort: Pulmonary effort is normal. No respiratory distress.     Breath sounds: Normal breath sounds. No wheezing or rales.  Abdominal:     General: Bowel sounds are normal.  Skin:    General: Skin is warm and dry.  Neurological:     Mental Status: She is alert and oriented to person, place, and time.  Psychiatric:        Mood and Affect: Mood normal.        Behavior: Behavior normal.        Judgment: Judgment normal.    LMP 06/30/2016  Wt Readings from Last 3  Encounters:  10/01/21 104 lb 0.6 oz (47.2 kg)  09/26/21 102 lb 3.2 oz (46.4 kg)  07/25/21 107 lb 3.2 oz (48.6 kg)   Diabetic Foot Exam - Simple   No data filed    Lab Results  Component Value Date   WBC 3.4 (L) 11/17/2021   HGB 13.0 11/17/2021   HCT 37.5 11/17/2021   PLT 248.0 11/17/2021   GLUCOSE 138 (H) 11/17/2021   CHOL 201 (H) 11/17/2021   TRIG 101.0 11/17/2021   HDL 51.30 11/17/2021   LDLDIRECT 130.0 01/10/2021   LDLCALC 130 (H) 11/17/2021   ALT 12 11/17/2021   AST 16 11/17/2021   NA 137 11/17/2021   K 3.5 11/17/2021   CL 101 11/17/2021   CREATININE 0.64 11/17/2021   BUN 14 11/17/2021   CO2 28 11/17/2021   TSH 2.04 11/17/2021   HGBA1C 5.2 11/19/2021   Lab Results  Component Value Date   TSH 2.04 11/17/2021   Lab Results  Component Value Date   WBC 3.4 (L) 11/17/2021   HGB 13.0 11/17/2021   HCT 37.5 11/17/2021   MCV 98.7 11/17/2021   PLT 248.0 11/17/2021   Lab Results  Component Value Date   NA 137 11/17/2021   K 3.5 11/17/2021   CO2 28 11/17/2021   GLUCOSE 138 (H) 11/17/2021   BUN 14  11/17/2021   CREATININE 0.64 11/17/2021   BILITOT 1.3 (H) 11/17/2021   ALKPHOS 83 11/17/2021   AST 16 11/17/2021   ALT 12 11/17/2021   PROT 6.6 11/17/2021   ALBUMIN 4.3 11/17/2021   CALCIUM 8.9 11/17/2021   ANIONGAP 10 01/09/2019   GFR 98.69 11/17/2021   Lab Results  Component Value Date   CHOL 201 (H) 11/17/2021   Lab Results  Component Value Date   HDL 51.30 11/17/2021   Lab Results  Component Value Date   LDLCALC 130 (H) 11/17/2021   Lab Results  Component Value Date   TRIG 101.0 11/17/2021   Lab Results  Component Value Date   CHOLHDL 4 11/17/2021   Lab Results  Component Value Date   HGBA1C 5.2 11/19/2021      Assessment & Plan:   Problem List Items Addressed This Visit   None  No orders of the defined types were placed in this encounter.  I, [name], personally preformed the services described in this documentation.  All  medical record entries made by the scribe were at my direction and in my presence.  I have reviewed the chart and discharge instructions (if applicable) and agree that the record reflects my personal performance and is accurate and complete. 12/16/2021  I,Mohammed Iqbal,acting as a scribe for Penni Homans, MD.,have documented all relevant documentation on the behalf of Penni Homans, MD,as directed by  Penni Homans, MD while in the presence of Penni Homans, MD.  [name]

## 2021-12-16 NOTE — Patient Instructions (Signed)
Toe Fracture A toe fracture is a break in one of the toe bones (phalanges). A toe fracture may be: A crack in the surface of the bone (stress fracture). This often occurs in athletes. A break all the way through the bone (complete fracture). What are the causes? This condition may be caused by: Direct impact, such as from dropping a heavy object on your toe. Stubbing your toe. Twisting or stretching your toe out of place. Overuse or repetitive exercise. What increases the risk? You are more likely to develop this condition if you: Play contact sports. Have a condition that causes the bones to become thin and brittle (osteoporosis). Have a low calcium level. What are the signs or symptoms? The main symptoms of this condition are swelling and pain in the toe. Other symptoms include: Bruising. Stiffness. Numbness. A change in the way the toe looks. Broken bones that poke through the skin. Blood beneath the toenail. How is this diagnosed? This condition is diagnosed with a physical exam. You may also have X-rays. How is this treated? Treatment for this condition depends on the type of fracture and its severity. Treatment may include: Taping the broken toe to a toe that is next to it (buddy taping). This is the most common treatment for fractures in which the bone has not moved out of place (non-displaced fracture). Wearing a shoe that has a wide, rigid sole to protect the toe and to limit its movement. Wearing a walking cast. Having a procedure to move the toe back into place. Surgery. This may be needed if the: Pieces of broken bone are out of place (displaced). Bone breaks through the skin. Physical therapy. This is done to help regain movement and strength in the toe. You may need follow-up X-rays to make sure that the bone is healing well and staying in position. Follow these instructions at home: If you have a shoe: Wear the shoe as told by your health care provider. Remove it  only as told by your health care provider. Loosen the shoe if your toes tingle, become numb, or turn cold and blue. Keep the shoe clean and dry. If you have a cast: Do not put pressure on any part of the cast until it is fully hardened. This may take several hours. Do not stick anything inside the cast to scratch your skin. Doing that increases your risk of infection. Check the skin around the cast every day. Tell your health care provider about any concerns. You may put lotion on dry skin around the edges of the cast. Do not put lotion on the skin underneath the cast. Keep the cast clean and dry. Bathing Do not take baths, swim, or use a hot tub until your health care provider approves. Ask your health care provider if you can take showers. If the shoe or cast is not waterproof: Do not let it get wet. Cover it with a watertight covering when you take a bath or a shower. Activity Do not use the injured foot to support your body weight until your health care provider says that you can. Use crutches as directed. Ask your health care provider: What activities are safe for you during recovery. What activities you need to avoid. Do physical therapy exercises as directed. Driving Do not drive or use heavy machinery while taking pain medicine. Do not drive while wearing a cast on a foot that you use for driving. Managing pain, stiffness, and swelling  If directed, put ice on painful   areas: Put ice in a plastic bag. Place a towel between your skin and the bag. If you have a shoe, remove it as told by your health care provider. If you have a cast, place a towel between your cast and the bag. Leave the ice on for 20 minutes, 2-3 times per day. Raise (elevate) the injured area above the level of your heart while you are sitting or lying down. General instructions If your toe was treated with buddy taping, follow your health care provider's instructions for changing the gauze and tape. Change it  more often if: The gauze and tape get wet. If this happens, dry the space between the toes. The gauze and tape are too tight and cause your toe to become pale or numb. If you were not given a protective shoe, wear sturdy, supportive shoes. Your shoes should not pinch your toes and should not fit tightly against your toes. Do not use any products that contain nicotine or tobacco, such as cigarettes and e-cigarettes. These can delay bone healing. If you need help quitting, ask your health care provider. Take over-the-counter and prescription medicines only as told by your health care provider. Keep all follow-up visits as told by your health care provider. This is important. Contact a health care provider if you have: Pain that gets worse or does not get better with medicine. A fever. A bad smell coming from your cast. Get help right away if you have: Any of the following in your toes or your foot: Numbness that gets worse. Tingling. Coldness. Blue skin. Redness or swelling that gets worse. Pain that suddenly becomes severe. Summary A toe fracture is a break in one of the toe bones (phalanges). Treatment depends on how severe your fracture is and how the pieces of the broken bone line up with each other. Treatment may include buddy taping, wearing a shoe or a cast, or using crutches. Ice and elevate your foot to help lessen the pain and swelling. This information is not intended to replace advice given to you by your health care provider. Make sure you discuss any questions you have with your health care provider. Document Revised: 09/16/2020 Document Reviewed: 09/16/2020 Elsevier Patient Education  2023 [employer]  

## 2021-12-16 NOTE — Assessment & Plan Note (Signed)
Encourage heart healthy diet such as MIND or DASH diet, increase exercise, avoid trans fats, simple carbohydrates and processed foods, consider a krill or fish or flaxseed oil cap daily.  °

## 2021-12-16 NOTE — Assessment & Plan Note (Signed)
Hydrate and monitor 

## 2021-12-16 NOTE — Assessment & Plan Note (Signed)
Avoid offending foods, start probiotics. Do not eat large meals in late evening and consider raising head of bed.  

## 2021-12-16 NOTE — Assessment & Plan Note (Signed)
hgba1c acceptable, minimize simple carbs. Increase exercise as tolerated.  

## 2021-12-17 NOTE — Assessment & Plan Note (Addendum)
After fracturing her right great toe she has struggled with increased pain and weakness in the right leg, most notably in the knee. She has gotten some therapy but is still struggling will refer for PT at the [facility] for further treatment

## 2021-12-24 ENCOUNTER — Ambulatory Visit: Payer: 59 | Attending: Family Medicine | Admitting: Physical Therapy

## 2021-12-24 DIAGNOSIS — M79604 Pain in right leg: Secondary | ICD-10-CM | POA: Insufficient documentation

## 2021-12-24 DIAGNOSIS — M25561 Pain in right knee: Secondary | ICD-10-CM | POA: Diagnosis present

## 2021-12-24 DIAGNOSIS — M6281 Muscle weakness (generalized): Secondary | ICD-10-CM | POA: Diagnosis present

## 2021-12-24 DIAGNOSIS — M79671 Pain in right foot: Secondary | ICD-10-CM | POA: Insufficient documentation

## 2021-12-24 DIAGNOSIS — M25571 Pain in right ankle and joints of right foot: Secondary | ICD-10-CM | POA: Diagnosis not present

## 2021-12-24 NOTE — Therapy (Signed)
OUTPATIENT PHYSICAL THERAPY LOWER EXTREMITY EVALUATION   Patient Name: Laurielle Selmon MRN: [id number] DOB:1965-05-04, 56 y.o., female Today's Date: 12/24/2021   PT End of Session - 12/24/21 1704     Visit Number 1    Authorization Type [id number] 7    PT Start Time 1705             Past Medical History:  Diagnosis Date   Allergic rhinitis    Anemia    Anxiety    Asthma    Contact dermatitis and eczema    Diverticulitis    CT Scan   Esophageal reflux    H. pylori infection    Hyperlipidemia, mixed    IBS (irritable bowel syndrome)    IC (interstitial cystitis)    Internal hemorrhoids    Lumbago    Mitral valve disorders(424.0)    Osteoarthritis 07/08/2015   Post concussive syndrome    Seasonal and perennial allergic rhinitis 01/20/2015   Past Surgical History:  Procedure Laterality Date   BARTHOLIN CYST MARSUPIALIZATION N/A 12/18/2013   Procedure: BARTHOLIN CYST MARSUPIALIZATION WITH BIOPSY;  Surgeon: Lovenia Kim, MD;  Location: [facility];  Service: Gynecology;  Laterality: N/A;   CESAREAN SECTION     DILITATION & CURRETTAGE/HYSTROSCOPY WITH NOVASURE ABLATION N/A 10/14/2012   Procedure: DILATATION & CURETTAGE/HYSTEROSCOPY WITH NOVASURE ABLATION;  Surgeon: Lovenia Kim, MD;  Location: [facility];  Service: Gynecology;  Laterality: N/A;   MOUTH SURGERY     SINUS SURGERY WITH INSTATRAK     Patient Active Problem List   Diagnosis Date Noted   Closed fracture of phalanx of right great toe 11/19/2021   Raynaud disease 08/26/2021   renal insufficiency 01/07/2021   Varicose veins of both lower extremities 10/28/2020   Hemorrhoids 10/28/2020   Hyperglycemia 04/01/2020   Nonintractable headache 12/31/2019   Right leg pain 11/19/2019   Constipation 11/17/2019   Neck pain 09/25/2019   Educated about COVID-19 virus infection 06/21/2019   Insomnia 02/19/2019   Anxiety and depression 01/13/2019   Perimenopausal 39/53/2023   History of Helicobacter pylori  infection 12/28/2017   Nocturia 07/19/2016   Rash and nonspecific skin eruption 03/21/2016   Post concussive syndrome 01/22/2016   Osteoarthritis 07/08/2015   Hematuria 01/20/2015   Hyperlipidemia, mild 01/20/2015   Seasonal and perennial allergic rhinitis 01/20/2015   Fatigue 10/24/2014   Arthralgia 08/20/2013   Diverticulosis 07/01/2011   Menopausal state 03/25/2011   Fibroids 10/24/2010   Anemia 03/25/2010   IRRITABLE BOWEL SYNDROME 03/25/2010   Mitral valve disorder 07/09/2007   Allergic asthma, mild intermittent, uncomplicated 34/35/6861   ESOPHAGEAL REFLUX 07/09/2007   LOW BACK PAIN 12/14/2006    PCP: Mosie Lukes., MD  REFERRING PROVIDER: Mosie Lukes., MD  REFERRING DIAG: [phone] (ICD-10-CM) - Arthralgia of right foot M79.604 (ICD-10-CM) - Right leg pain  THERAPY DIAG:  Acute pain of right knee  Pain in right foot  Muscle weakness (generalized)  Rationale for Evaluation and Treatment Rehabilitation  ONSET DATE: 09/25/2021  SUBJECTIVE:   SUBJECTIVE STATEMENT: Patient fell on 09/25/2021, fractured R great toe. That has been getting better, but her knee had started to hurt.  She has been getting PT at [facility], and said they were mostly icing her leg.   Only has pain now if tries to run or strenuous activity.    PERTINENT HISTORY: post concussion syndrome, R great toe fracture, history migraines,  PAIN:  Are you having pain? Yes: NPRS scale: 0-7/10 Pain location: R knee Pain  description: uncomfortable Aggravating factors: running, yoga, bending knee, going down stairs Relieving factors: ice and rest  PRECAUTIONS: None  WEIGHT BEARING RESTRICTIONS No  FALLS:  Has patient fallen in last 6 months? Yes. Number of falls 1, slipped turning around.   LIVING ENVIRONMENT: Lives with: lives with their family Lives in: House/apartment Stairs: Yes: Internal: 12 steps; on right going up Has following equipment at home: None  OCCUPATION: social  services/medicaid eligibility specialist  PLOF: Independent used to do Pilates and run daily  PATIENT GOALS build up strength in right leg, be able to run again.    OBJECTIVE:   DIAGNOSTIC FINDINGS:  09/26/2021 Right great toe Xray IMPRESSION: Acute nondisplaced intra-articular fracture involving the base of the first distal phalanx.  12/16/2021 Right knee Xray: FINDINGS: No evidence of fracture, dislocation, or joint effusion. No evidence of arthropathy or other focal bone abnormality. Soft tissues are unremarkable.    PATIENT SURVEYS:  LEFS 47/80  COGNITION:  Overall cognitive status: Within functional limits for tasks assessed     SENSATION: WFL  EDEMA:  NA  MUSCLE LENGTH: Hamstrings: Right 90 deg; Left 90 deg  POSTURE: rounded shoulders and forward head  PALPATION: Tenderness R LCL, R vastus lateralis  LOWER EXTREMITY MMT:  MMT Right eval Left eval  Hip flexion 4+ 4+  Hip extension 4 4  Hip abduction 4+ 4+  Hip adduction 4+ 4+   Knee flexion 4+ 5  Knee extension 5 5  Ankle dorsiflexion 5 5  Ankle plantarflexion 4+* 5  Ankle inversion    Ankle eversion     (Blank rows = not tested)  LOWER EXTREMITY ROM:  AROM Right eval Left eval  Hip flexion    Hip extension    Hip abduction    Hip adduction    Hip internal rotation 10 20  Hip external rotation    Knee flexion    Knee extension    Ankle dorsiflexion 0 20  Ankle plantarflexion    Ankle inversion    Ankle eversion     (Blank rows = not tested)  LOWER EXTREMITY SPECIAL TESTS:  Knee special tests: Anterior drawer test: negative, Posterior drawer test: negative, and McMurray's test: negative  FUNCTIONAL TESTS:  5 times sit to stand: 11 seconds  GAIT: Distance walked: 41' Assistive device utilized: None Level of assistance: Complete Independence Comments: no significant deviation or device    TODAY'S TREATMENT: 12/24/2021 Modalities: CP to R foot x 10 min     PATIENT EDUCATION:   Education details: findings, POC, education on imaging and her X-rays - no signs of arthritis or injury to bone.  Person educated: Patient Education method: Explanation Education comprehension: verbalized understanding   HOME EXERCISE PROGRAM: TBD  ASSESSMENT:  CLINICAL IMPRESSION: Patient is a 56 y.o. female who was seen today for physical therapy evaluation and treatment for right foot and leg pain.  She reports her R great toe was fractured, but as pain has improved she has been having more R knee pain.  She also feels the quad muscle has atrophied.  Xrays of knee were negative.  She does demonstrate weakness in hip musculature, tenderness in R vastus and LCL, and also significant tightness in R ankle for dorsiflexion, which can also impact her knee.  She would benefit from skilled physical therapy to decrease R knee pain and improve bil LE strength in order to be able to return to running.     OBJECTIVE IMPAIRMENTS decreased activity tolerance, decreased endurance, decreased ROM,  decreased strength, increased fascial restrictions, increased muscle spasms, impaired flexibility, and pain.   ACTIVITY LIMITATIONS bending, standing, squatting, stairs, and locomotion level  PARTICIPATION LIMITATIONS: community activity and sports  PERSONAL FACTORS 1-2 comorbidities: history of post-concussion syndrome and vertigo, IBS, migraines  are also affecting patient's functional outcome.   REHAB POTENTIAL: Good  CLINICAL DECISION MAKING: Stable/uncomplicated  EVALUATION COMPLEXITY: Low   GOALS: Goals reviewed with patient? Yes  SHORT TERM GOALS: Target date: 01/07/2022   Patient will be independent with initial HEP. Baseline: no HEP Goal status: INITIAL    LONG TERM GOALS: Target date: 02/04/2022   Patient will be independent with advanced/ongoing HEP to improve outcomes and carryover.  Baseline:  Goal status: INITIAL  2.  Patient will report at least 75% improvement in R knee  pain with activity. Baseline: up to 7/10 pain with activity Goal status: INITIAL  3.  Patient will demonstrate 5/5 hip strength to support knee and improve kinematics.  Baseline: see objective Goal status: INITIAL  4. Patient will be able to ascend/descend stairs with 1 HR and reciprocal step pattern  without increased knee pain to access.  Baseline: pain descending Goal status: INITIAL  5.  Patient will report >56/80 on LEFS to demonstrate improved functional ability. Baseline: 47/80 Goal status: INITIAL  6. Patient will demonstrate R hip and ankle AROM = LLE AROM.   Baseline: tightness R hip IR, R ankle DF Goal status: INITIAL   7.  Patient will be able to run x 50' without increased R knee/foot pain.  Baseline: unable Goal status: INITIAL    PLAN: PT FREQUENCY: 1-2x/week  PT DURATION: 6 weeks  PLANNED INTERVENTIONS: Therapeutic exercises, Therapeutic activity, Neuromuscular re-education, Balance training, Gait training, Patient/Family education, Self Care, Joint mobilization, Stair training, Orthotic/Fit training, Dry Needling, Electrical stimulation, Cryotherapy, Moist heat, scar mobilization, Taping, Vasopneumatic device, Ultrasound, Ionotophoresis '4mg'$ /ml Dexamethasone, Manual therapy, and Re-evaluation  PLAN FOR NEXT SESSION: needs HEP for LE strengthening, focusing on hip strength, R ankle ROM, consider [place] R knee, TrDN, manual therapy and modalities PRN.    Rennie Natter, PT 12/24/2021, 6:14 PM

## 2021-12-31 ENCOUNTER — Ambulatory Visit: Payer: 59 | Admitting: Physical Therapy

## 2022-01-06 ENCOUNTER — Ambulatory Visit: Payer: 59 | Attending: Family Medicine

## 2022-01-06 DIAGNOSIS — M6281 Muscle weakness (generalized): Secondary | ICD-10-CM | POA: Insufficient documentation

## 2022-01-06 DIAGNOSIS — M25561 Pain in right knee: Secondary | ICD-10-CM | POA: Diagnosis present

## 2022-01-06 DIAGNOSIS — M79671 Pain in right foot: Secondary | ICD-10-CM | POA: Diagnosis present

## 2022-01-06 NOTE — Therapy (Signed)
OUTPATIENT PHYSICAL THERAPY TREATMENT   Patient Name: [name] MRN: [id number] DOB:Nov 15, 1965, 56 y.o., female Today's Date: 01/06/2022   PT End of Session - 01/06/22 1752     Visit Number 2    Authorization Type U[employer] [id number] used 7    PT Start Time 1706   pt arrived late   PT Stop Time 1745    PT Time Calculation (min) 39 min    Activity Tolerance Patient tolerated treatment well    Behavior During Therapy WFL for tasks assessed/performed              Past Medical History:  Diagnosis Date   Allergic rhinitis    Anemia    Anxiety    Asthma    Contact dermatitis and eczema    Diverticulitis    CT Scan   Esophageal reflux    H. pylori infection    Hyperlipidemia, mixed    IBS (irritable bowel syndrome)    IC (interstitial cystitis)    Internal hemorrhoids    Lumbago    Mitral valve disorders(424.0)    Osteoarthritis 07/08/2015   Post concussive syndrome    Seasonal and perennial allergic rhinitis 01/20/2015   Past Surgical History:  Procedure Laterality Date   BARTHOLIN CYST MARSUPIALIZATION N/A 12/18/2013   Procedure: BARTHOLIN CYST MARSUPIALIZATION WITH BIOPSY;  Surgeon: Lovenia Kim, MD;  Location: [facility];  Service: Gynecology;  Laterality: N/A;   CESAREAN SECTION     DILITATION & CURRETTAGE/HYSTROSCOPY WITH NOVASURE ABLATION N/A 10/14/2012   Procedure: DILATATION & CURETTAGE/HYSTEROSCOPY WITH NOVASURE ABLATION;  Surgeon: Lovenia Kim, MD;  Location: [facility];  Service: Gynecology;  Laterality: N/A;   MOUTH SURGERY     SINUS SURGERY WITH INSTATRAK     Patient Active Problem List   Diagnosis Date Noted   Closed fracture of phalanx of right great toe 11/19/2021   Raynaud disease 08/26/2021   renal insufficiency 01/07/2021   Varicose veins of both lower extremities 10/28/2020   Hemorrhoids 10/28/2020   Hyperglycemia 04/01/2020   Nonintractable headache 12/31/2019   Right leg pain 11/19/2019   Constipation 11/17/2019   Neck pain 09/25/2019    Educated about COVID-19 virus infection 06/21/2019   Insomnia 02/19/2019   Anxiety and depression 01/13/2019   Perimenopausal 82/50/0370   History of Helicobacter pylori infection 12/28/2017   Nocturia 07/19/2016   Rash and nonspecific skin eruption 03/21/2016   Post concussive syndrome 01/22/2016   Osteoarthritis 07/08/2015   Hematuria 01/20/2015   Hyperlipidemia, mild 01/20/2015   Seasonal and perennial allergic rhinitis 01/20/2015   Fatigue 10/24/2014   Arthralgia 08/20/2013   Diverticulosis 07/01/2011   Menopausal state 03/25/2011   Fibroids 10/24/2010   Anemia 03/25/2010   IRRITABLE BOWEL SYNDROME 03/25/2010   Mitral valve disorder 07/09/2007   Allergic asthma, mild intermittent, uncomplicated 48/88/9169   ESOPHAGEAL REFLUX 07/09/2007   LOW BACK PAIN 12/14/2006    PCP: Mosie Lukes., MD  REFERRING PROVIDER: Mosie Lukes., MD  REFERRING DIAG: [phone] (ICD-10-CM) - Arthralgia of right foot M79.604 (ICD-10-CM) - Right leg pain  THERAPY DIAG:  Acute pain of right knee  Pain in right foot  Muscle weakness (generalized)  Rationale for Evaluation and Treatment Rehabilitation  ONSET DATE: 09/25/2021  SUBJECTIVE:   SUBJECTIVE STATEMENT:  Pt reports 5/10 pain in R ankle and knee when walking, no pain at rest.  PERTINENT HISTORY: post concussion syndrome, R great toe fracture, history migraines,  PAIN:  Are you having pain? Yes: NPRS scale: 0/10 Pain location:  R knee and R ankle Pain description: uncomfortable Aggravating factors: running, yoga, bending knee, going down stairs Relieving factors: ice and rest  PRECAUTIONS: None  WEIGHT BEARING RESTRICTIONS No  FALLS:  Has patient fallen in last 6 months? Yes. Number of falls 1, slipped turning around.   LIVING ENVIRONMENT: Lives with: lives with their family Lives in: House/apartment Stairs: Yes: Internal: 12 steps; on right going up Has following equipment at home: None  OCCUPATION: social  services/medicaid eligibility specialist  PLOF: Independent used to do Pilates and run daily  PATIENT GOALS build up strength in right leg, be able to run again.    OBJECTIVE:   DIAGNOSTIC FINDINGS:  09/26/2021 Right great toe Xray IMPRESSION: Acute nondisplaced intra-articular fracture involving the base of the first distal phalanx.  12/16/2021 Right knee Xray: FINDINGS: No evidence of fracture, dislocation, or joint effusion. No evidence of arthropathy or other focal bone abnormality. Soft tissues are unremarkable.    PATIENT SURVEYS:  LEFS 47/80  COGNITION:  Overall cognitive status: Within functional limits for tasks assessed     SENSATION: WFL  EDEMA:  NA  MUSCLE LENGTH: Hamstrings: Right 90 deg; Left 90 deg  POSTURE: rounded shoulders and forward head  PALPATION: Tenderness R LCL, R vastus lateralis  LOWER EXTREMITY MMT:  MMT Right eval Left eval  Hip flexion 4+ 4+  Hip extension 4 4  Hip abduction 4+ 4+  Hip adduction 4+ 4+   Knee flexion 4+ 5  Knee extension 5 5  Ankle dorsiflexion 5 5  Ankle plantarflexion 4+* 5  Ankle inversion    Ankle eversion     (Blank rows = not tested)  LOWER EXTREMITY ROM:  AROM Right eval Left eval  Hip flexion    Hip extension    Hip abduction    Hip adduction    Hip internal rotation 10 20  Hip external rotation    Knee flexion    Knee extension    Ankle dorsiflexion 0 20  Ankle plantarflexion    Ankle inversion    Ankle eversion     (Blank rows = not tested)  LOWER EXTREMITY SPECIAL TESTS:  Knee special tests: Anterior drawer test: negative, Posterior drawer test: negative, and McMurray's test: negative  FUNCTIONAL TESTS:  5 times sit to stand: 11 seconds  GAIT: Distance walked: 78' Assistive device utilized: None Level of assistance: Complete Independence Comments: no significant deviation or device    TODAY'S TREATMENT: 01/06/22 TherEx: Seated hamstring stretch x 30 sec bil Standing gastroc  stretch at step x 30 sec bil Seated heel/toe raise x 10 bil Supine bridge + red TB x 10 Supine clam x 10 alt bent knee fallout red TB Supine SLR x 10 bil LTR 3x10" bil Supine figure 4 stretch 2x30 sec  12/24/2021 Modalities: CP to R foot x 10 min     PATIENT EDUCATION:  Education details: findings, POC, education on imaging and her X-rays - no signs of arthritis or injury to bone.  Person educated: Patient Education method: Explanation Education comprehension: verbalized understanding   HOME EXERCISE PROGRAM: Access Code: [email or web address] URL: [email or web address] Date: 01/06/2022 Prepared by: Clarene Essex  Exercises - Supine Lower Trunk Rotation  - 1 x daily - 7 x weekly - 2 sets - 5 reps - 10 sec hold - Seated Hamstring Stretch  - 2 x daily - 7 x weekly - 2 sets - 30 sec hold - Gastroc Stretch on Step  - 2 x daily - 7 x weekly -  2 sets - 30 sec hold - Supine Bridge with Resistance Band  - 1 x daily - 7 x weekly - 2 sets - 10 reps - Hooklying Isometric Clamshell  - 1 x daily - 7 x weekly - 2 sets - 10 reps - Active Straight Leg Raise with Quad Set  - 1 x daily - 7 x weekly - 2 sets - 10 reps  ASSESSMENT:  CLINICAL IMPRESSION: Pt responded well to treatment, although throughout session noting some discomfort along the R SIJ area with hooklying exercises. She did note great relief with LTR with holds. Pt had tendency to fatigue rather quickly in both LE with exercises. Gave initial HEP today and reviewed each exercise with her to ensure understanding. Pt today demonstrated decreased muscle endurance at hip level, would benefit from more progression of proximal strengthening.   OBJECTIVE IMPAIRMENTS decreased activity tolerance, decreased endurance, decreased ROM, decreased strength, increased fascial restrictions, increased muscle spasms, impaired flexibility, and pain.   ACTIVITY LIMITATIONS bending, standing, squatting, stairs, and locomotion level  PARTICIPATION  LIMITATIONS: community activity and sports  PERSONAL FACTORS 1-2 comorbidities: history of post-concussion syndrome and vertigo, IBS, migraines  are also affecting patient's functional outcome.   REHAB POTENTIAL: Good  CLINICAL DECISION MAKING: Stable/uncomplicated  EVALUATION COMPLEXITY: Low   GOALS: Goals reviewed with patient? Yes  SHORT TERM GOALS: Target date: 01/07/2022   Patient will be independent with initial HEP. Baseline: no HEP Goal status: IN PROGRESS    LONG TERM GOALS: Target date: 02/04/2022   Patient will be independent with advanced/ongoing HEP to improve outcomes and carryover.  Baseline:  Goal status: IN PROGRESS  2.  Patient will report at least 75% improvement in R knee pain with activity. Baseline: up to 7/10 pain with activity Goal status: IN PROGRESS  3.  Patient will demonstrate 5/5 hip strength to support knee and improve kinematics.  Baseline: see objective Goal status: IN PROGRESS  4. Patient will be able to ascend/descend stairs with 1 HR and reciprocal step pattern  without increased knee pain to access.  Baseline: pain descending Goal status: IN PROGRESS  5.  Patient will report >56/80 on LEFS to demonstrate improved functional ability. Baseline: 47/80 Goal status: IN PROGRESS  6. Patient will demonstrate R hip and ankle AROM = LLE AROM.   Baseline: tightness R hip IR, R ankle DF Goal status: IN PROGRESS   7.  Patient will be able to run x 50' without increased R knee/foot pain.  Baseline: unable Goal status: IN PROGRESS    PLAN: PT FREQUENCY: 1-2x/week  PT DURATION: 6 weeks  PLANNED INTERVENTIONS: Therapeutic exercises, Therapeutic activity, Neuromuscular re-education, Balance training, Gait training, Patient/Family education, Self Care, Joint mobilization, Stair training, Orthotic/Fit training, Dry Needling, Electrical stimulation, Cryotherapy, Moist heat, scar mobilization, Taping, Vasopneumatic device, Ultrasound,  Ionotophoresis '4mg'$ /ml Dexamethasone, Manual therapy, and Re-evaluation  PLAN FOR NEXT SESSION: review HEP for LE strengthening, focusing on hip strength, R ankle ROM, consider [place] R knee, TrDN, manual therapy and modalities PRN.    Artist Pais, PTA 01/06/2022, 5:53 PM

## 2022-01-08 ENCOUNTER — Encounter: Payer: Self-pay | Admitting: Physical Therapy

## 2022-01-08 ENCOUNTER — Ambulatory Visit: Payer: 59 | Admitting: Physical Therapy

## 2022-01-08 DIAGNOSIS — M25561 Pain in right knee: Secondary | ICD-10-CM | POA: Diagnosis not present

## 2022-01-08 DIAGNOSIS — M6281 Muscle weakness (generalized): Secondary | ICD-10-CM

## 2022-01-08 DIAGNOSIS — M79671 Pain in right foot: Secondary | ICD-10-CM

## 2022-01-08 NOTE — Therapy (Signed)
OUTPATIENT PHYSICAL THERAPY TREATMENT   Patient Name: [name] MRN: [id number] DOB:February 13, 1966, 56 y.o., female Today's Date: 01/08/2022   PT End of Session - 01/08/22 1620     Visit Number 3    Number of Visits 12    Date for PT Re-Evaluation 02/04/22    Authorization Type UHC [id number] used 7 previously    PT Start Time 1618    PT Stop Time 1704    PT Time Calculation (min) 46 min    Activity Tolerance Patient tolerated treatment well    Behavior During Therapy WFL for tasks assessed/performed              Past Medical History:  Diagnosis Date   Allergic rhinitis    Anemia    Anxiety    Asthma    Contact dermatitis and eczema    Diverticulitis    CT Scan   Esophageal reflux    H. pylori infection    Hyperlipidemia, mixed    IBS (irritable bowel syndrome)    IC (interstitial cystitis)    Internal hemorrhoids    Lumbago    Mitral valve disorders(424.0)    Osteoarthritis 07/08/2015   Post concussive syndrome    Seasonal and perennial allergic rhinitis 01/20/2015   Past Surgical History:  Procedure Laterality Date   BARTHOLIN CYST MARSUPIALIZATION N/A 12/18/2013   Procedure: BARTHOLIN CYST MARSUPIALIZATION WITH BIOPSY;  Surgeon: Lovenia Kim, MD;  Location: [facility];  Service: Gynecology;  Laterality: N/A;   CESAREAN SECTION     DILITATION & CURRETTAGE/HYSTROSCOPY WITH NOVASURE ABLATION N/A 10/14/2012   Procedure: DILATATION & CURETTAGE/HYSTEROSCOPY WITH NOVASURE ABLATION;  Surgeon: Lovenia Kim, MD;  Location: [facility];  Service: Gynecology;  Laterality: N/A;   MOUTH SURGERY     SINUS SURGERY WITH INSTATRAK     Patient Active Problem List   Diagnosis Date Noted   Closed fracture of phalanx of right great toe 11/19/2021   Raynaud disease 08/26/2021   renal insufficiency 01/07/2021   Varicose veins of both lower extremities 10/28/2020   Hemorrhoids 10/28/2020   Hyperglycemia 04/01/2020   Nonintractable headache 12/31/2019   Right leg pain 11/19/2019    Constipation 11/17/2019   Neck pain 09/25/2019   Educated about COVID-19 virus infection 06/21/2019   Insomnia 02/19/2019   Anxiety and depression 01/13/2019   Perimenopausal 76/73/4193   History of Helicobacter pylori infection 12/28/2017   Nocturia 07/19/2016   Rash and nonspecific skin eruption 03/21/2016   Post concussive syndrome 01/22/2016   Osteoarthritis 07/08/2015   Hematuria 01/20/2015   Hyperlipidemia, mild 01/20/2015   Seasonal and perennial allergic rhinitis 01/20/2015   Fatigue 10/24/2014   Arthralgia 08/20/2013   Diverticulosis 07/01/2011   Menopausal state 03/25/2011   Fibroids 10/24/2010   Anemia 03/25/2010   IRRITABLE BOWEL SYNDROME 03/25/2010   Mitral valve disorder 07/09/2007   Allergic asthma, mild intermittent, uncomplicated 79/05/4095   ESOPHAGEAL REFLUX 07/09/2007   LOW BACK PAIN 12/14/2006    PCP: Mosie Lukes., MD  REFERRING PROVIDER: Mosie Lukes., MD  REFERRING DIAG: [phone] (ICD-10-CM) - Arthralgia of right foot M79.604 (ICD-10-CM) - Right leg pain  THERAPY DIAG:  Acute pain of right knee  Pain in right foot  Muscle weakness (generalized)  Rationale for Evaluation and Treatment Rehabilitation  ONSET DATE: 09/25/2021  SUBJECTIVE:   SUBJECTIVE STATEMENT: [name] noted some achiness in R knee after prolonged sitting "I think it may be that adult arthritis."  Able to go down stairs a little better now.  PERTINENT HISTORY: post concussion syndrome, R great toe fracture, history migraines,  PAIN:  Are you having pain? Yes: NPRS scale: 3-4/10 Pain location: R knee and R ankle Pain description: uncomfortable Aggravating factors: running, yoga, bending knee, going down stairs Relieving factors: ice and rest  PRECAUTIONS: None  WEIGHT BEARING RESTRICTIONS No  FALLS:  Has patient fallen in last 6 months? Yes. Number of falls 1, slipped turning around.   LIVING ENVIRONMENT: Lives with: lives with their family Lives  in: House/apartment Stairs: Yes: Internal: 12 steps; on right going up Has following equipment at home: None  OCCUPATION: social services/medicaid eligibility specialist  PLOF: Independent used to do Pilates and run daily  PATIENT GOALS build up strength in right leg, be able to run again.    OBJECTIVE:   DIAGNOSTIC FINDINGS:  09/26/2021 Right great toe Xray IMPRESSION: Acute nondisplaced intra-articular fracture involving the base of the first distal phalanx.  12/16/2021 Right knee Xray: FINDINGS: No evidence of fracture, dislocation, or joint effusion. No evidence of arthropathy or other focal bone abnormality. Soft tissues are unremarkable.    PATIENT SURVEYS:  LEFS 47/80  COGNITION:  Overall cognitive status: Within functional limits for tasks assessed     SENSATION: WFL  EDEMA:  NA  MUSCLE LENGTH: Hamstrings: Right 90 deg; Left 90 deg  POSTURE: rounded shoulders and forward head  PALPATION: Tenderness R LCL, R vastus lateralis  LOWER EXTREMITY MMT:  MMT Right eval Left eval  Hip flexion 4+ 4+  Hip extension 4 4  Hip abduction 4+ 4+  Hip adduction 4+ 4+   Knee flexion 4+ 5  Knee extension 5 5  Ankle dorsiflexion 5 5  Ankle plantarflexion 4+* 5  Ankle inversion    Ankle eversion     (Blank rows = not tested)  LOWER EXTREMITY ROM:  AROM Right eval Left eval  Hip flexion    Hip extension    Hip abduction    Hip adduction    Hip internal rotation 10 20  Hip external rotation    Knee flexion    Knee extension    Ankle dorsiflexion 0 20  Ankle plantarflexion    Ankle inversion    Ankle eversion     (Blank rows = not tested)  LOWER EXTREMITY SPECIAL TESTS:  Knee special tests: Anterior drawer test: negative, Posterior drawer test: negative, and McMurray's test: negative  FUNCTIONAL TESTS:  5 times sit to stand: 11 seconds  GAIT: Distance walked: 58' Assistive device utilized: None Level of assistance: Complete Independence Comments: no  significant deviation or device    TODAY'S TREATMENT: 01/08/22 Therapeutic Exercise: to improve strength and mobility.  Demo, verbal and tactile cues throughout for technique. Bike L2 x 6 min  Heel stretch on step 2 x 30 sec SL taps on 1" step x 10 forward each side Squats with RTB x 20  Bridges with RTB x 20 S/L Clamshells RTB x 15 bil  SLR 2 x 10 bil  Bird dogs x 10 bil  Prone extension 2 x 10 bil  Seated - toe spreading, yoga toes (extending great toe, extending lesser toes) great toe abduction.    01/06/22 TherEx: Seated hamstring stretch x 30 sec bil Standing gastroc stretch at step x 30 sec bil Seated heel/toe raise x 10 bil Supine bridge + red TB x 10 Supine clam x 10 alt bent knee fallout red TB Supine SLR x 10 bil LTR 3x10" bil Supine figure 4 stretch 2x30 sec  12/24/2021 Modalities: CP  to R foot x 10 min     PATIENT EDUCATION:  Education details: progressed HEP, issued RTB Person educated: Patient Education method: Consulting civil engineer, Demonstration, Verbal cues, and Handouts Education comprehension: verbalized understanding and returned demonstration   HOME EXERCISE PROGRAM: Access Code: [email or web address]  ASSESSMENT:  CLINICAL IMPRESSION: Kaislee Chao reports partial compliance with HEP, not yet consistent, and still reporting achiness in R knee and great toe.  Today progressed exercises with emphasis on hip strengthening, also added intrinsic foot exercises as well.  She tolerated exercises well without complaint and expressed optimism for improved recovery with exercises.  Jailey Booton continues to demonstrate potential for improvement and would benefit from continued skilled therapy to address impairments.      OBJECTIVE IMPAIRMENTS decreased activity tolerance, decreased endurance, decreased ROM, decreased strength, increased fascial restrictions, increased muscle spasms, impaired flexibility, and pain.   ACTIVITY LIMITATIONS bending, standing, squatting, stairs,  and locomotion level  PARTICIPATION LIMITATIONS: community activity and sports  PERSONAL FACTORS 1-2 comorbidities: history of post-concussion syndrome and vertigo, IBS, migraines  are also affecting patient's functional outcome.   REHAB POTENTIAL: Good  CLINICAL DECISION MAKING: Stable/uncomplicated  EVALUATION COMPLEXITY: Low   GOALS: Goals reviewed with patient? Yes  SHORT TERM GOALS: Target date: 01/07/2022   Patient will be independent with initial HEP. Baseline: no HEP Goal status: IN PROGRESS    LONG TERM GOALS: Target date: 02/04/2022   Patient will be independent with advanced/ongoing HEP to improve outcomes and carryover.  Baseline:  Goal status: IN PROGRESS  2.  Patient will report at least 75% improvement in R knee pain with activity. Baseline: up to 7/10 pain with activity Goal status: IN PROGRESS  3.  Patient will demonstrate 5/5 hip strength to support knee and improve kinematics.  Baseline: see objective Goal status: IN PROGRESS  4. Patient will be able to ascend/descend stairs with 1 HR and reciprocal step pattern  without increased knee pain to access.  Baseline: pain descending Goal status: IN PROGRESS  5.  Patient will report >56/80 on LEFS to demonstrate improved functional ability. Baseline: 47/80 Goal status: IN PROGRESS  6. Patient will demonstrate R hip and ankle AROM = LLE AROM.   Baseline: tightness R hip IR, R ankle DF Goal status: IN PROGRESS   7.  Patient will be able to run x 50' without increased R knee/foot pain.  Baseline: unable Goal status: IN PROGRESS    PLAN: PT FREQUENCY: 1-2x/week  PT DURATION: 6 weeks  PLANNED INTERVENTIONS: Therapeutic exercises, Therapeutic activity, Neuromuscular re-education, Balance training, Gait training, Patient/Family education, Self Care, Joint mobilization, Stair training, Orthotic/Fit training, Dry Needling, Electrical stimulation, Cryotherapy, Moist heat, scar mobilization, Taping,  Vasopneumatic device, Ultrasound, Ionotophoresis '4mg'$ /ml Dexamethasone, Manual therapy, and Re-evaluation  PLAN FOR NEXT SESSION: review HEP for LE strengthening, focusing on hip strength, R ankle ROM, manual therapy and modalities PRN.    Rennie Natter, PT, DPT  01/08/2022, 5:12 PM

## 2022-01-12 ENCOUNTER — Telehealth: Payer: Self-pay | Admitting: Family Medicine

## 2022-01-12 NOTE — Telephone Encounter (Signed)
Letter sent.

## 2022-01-12 NOTE — Telephone Encounter (Signed)
Patient called to see if she can get a doctor's note from Dr Charlett Blake. She has been wearing sneakers to help with balance so her broken toes do not squeeze together while at work. Her employer will allow her to wear sneakers but she needs a doctors note asking for them to allow her to do that. Okay to place in [place]

## 2022-01-13 ENCOUNTER — Ambulatory Visit: Payer: 59

## 2022-01-13 DIAGNOSIS — M25561 Pain in right knee: Secondary | ICD-10-CM | POA: Diagnosis not present

## 2022-01-13 DIAGNOSIS — M6281 Muscle weakness (generalized): Secondary | ICD-10-CM

## 2022-01-13 DIAGNOSIS — M79671 Pain in right foot: Secondary | ICD-10-CM

## 2022-01-13 NOTE — Therapy (Signed)
OUTPATIENT PHYSICAL THERAPY TREATMENT   Patient Name: [name] MRN: [id number] DOB:August 05, 1965, 56 y.o., female Today's Date: 01/13/2022   PT End of Session - 01/13/22 1713     Visit Number 4    Number of Visits 12    Date for PT Re-Evaluation 02/04/22    Authorization Type [employer] [id number] used 7 previously    PT Start Time 1621    PT Stop Time 9326    PT Time Calculation (min) 44 min    Activity Tolerance Patient tolerated treatment well    Behavior During Therapy WFL for tasks assessed/performed               Past Medical History:  Diagnosis Date   Allergic rhinitis    Anemia    Anxiety    Asthma    Contact dermatitis and eczema    Diverticulitis    CT Scan   Esophageal reflux    H. pylori infection    Hyperlipidemia, mixed    IBS (irritable bowel syndrome)    IC (interstitial cystitis)    Internal hemorrhoids    Lumbago    Mitral valve disorders(424.0)    Osteoarthritis 07/08/2015   Post concussive syndrome    Seasonal and perennial allergic rhinitis 01/20/2015   Past Surgical History:  Procedure Laterality Date   BARTHOLIN CYST MARSUPIALIZATION N/A 12/18/2013   Procedure: BARTHOLIN CYST MARSUPIALIZATION WITH BIOPSY;  Surgeon: Lovenia Kim, MD;  Location: [facility];  Service: Gynecology;  Laterality: N/A;   CESAREAN SECTION     DILITATION & CURRETTAGE/HYSTROSCOPY WITH NOVASURE ABLATION N/A 10/14/2012   Procedure: DILATATION & CURETTAGE/HYSTEROSCOPY WITH NOVASURE ABLATION;  Surgeon: Lovenia Kim, MD;  Location: [facility];  Service: Gynecology;  Laterality: N/A;   MOUTH SURGERY     SINUS SURGERY WITH INSTATRAK     Patient Active Problem List   Diagnosis Date Noted   Closed fracture of phalanx of right great toe 11/19/2021   Raynaud disease 08/26/2021   renal insufficiency 01/07/2021   Varicose veins of both lower extremities 10/28/2020   Hemorrhoids 10/28/2020   Hyperglycemia 04/01/2020   Nonintractable headache 12/31/2019   Right leg pain 11/19/2019    Constipation 11/17/2019   Neck pain 09/25/2019   Educated about COVID-19 virus infection 06/21/2019   Insomnia 02/19/2019   Anxiety and depression 01/13/2019   Perimenopausal 71/24/5809   History of Helicobacter pylori infection 12/28/2017   Nocturia 07/19/2016   Rash and nonspecific skin eruption 03/21/2016   Post concussive syndrome 01/22/2016   Osteoarthritis 07/08/2015   Hematuria 01/20/2015   Hyperlipidemia, mild 01/20/2015   Seasonal and perennial allergic rhinitis 01/20/2015   Fatigue 10/24/2014   Arthralgia 08/20/2013   Diverticulosis 07/01/2011   Menopausal state 03/25/2011   Fibroids 10/24/2010   Anemia 03/25/2010   IRRITABLE BOWEL SYNDROME 03/25/2010   Mitral valve disorder 07/09/2007   Allergic asthma, mild intermittent, uncomplicated 98/33/8250   ESOPHAGEAL REFLUX 07/09/2007   LOW BACK PAIN 12/14/2006    PCP: Mosie Lukes., MD  REFERRING PROVIDER: Mosie Lukes., MD  REFERRING DIAG: [phone] (ICD-10-CM) - Arthralgia of right foot M79.604 (ICD-10-CM) - Right leg pain  THERAPY DIAG:  Acute pain of right knee  Pain in right foot  Muscle weakness (generalized)  Rationale for Evaluation and Treatment Rehabilitation  ONSET DATE: 09/25/2021  SUBJECTIVE:   SUBJECTIVE STATEMENT: Pt reports she is having more tension in neck/shoulders today, probably from carrying her bag at work. NO ankle pain but some discomfort along lateral R knee.  PERTINENT HISTORY: post concussion syndrome, R great toe fracture, history migraines,  PAIN:  Are you having pain? Yes: NPRS scale: 6/10 Pain location: lateral R knee  Pain description: uncomfortable Aggravating factors: running, yoga, bending knee, going down stairs Relieving factors: ice and rest  PRECAUTIONS: None  WEIGHT BEARING RESTRICTIONS No  FALLS:  Has patient fallen in last 6 months? Yes. Number of falls 1, slipped turning around.   LIVING ENVIRONMENT: Lives with: lives with their family Lives in:  House/apartment Stairs: Yes: Internal: 12 steps; on right going up Has following equipment at home: None  OCCUPATION: social services/medicaid eligibility specialist  PLOF: Independent used to do Pilates and run daily  PATIENT GOALS build up strength in right leg, be able to run again.    OBJECTIVE:   DIAGNOSTIC FINDINGS:  09/26/2021 Right great toe Xray IMPRESSION: Acute nondisplaced intra-articular fracture involving the base of the first distal phalanx.  12/16/2021 Right knee Xray: FINDINGS: No evidence of fracture, dislocation, or joint effusion. No evidence of arthropathy or other focal bone abnormality. Soft tissues are unremarkable.    PATIENT SURVEYS:  LEFS 47/80  COGNITION:  Overall cognitive status: Within functional limits for tasks assessed     SENSATION: WFL  EDEMA:  NA  MUSCLE LENGTH: Hamstrings: Right 90 deg; Left 90 deg  POSTURE: rounded shoulders and forward head  PALPATION: Tenderness R LCL, R vastus lateralis  LOWER EXTREMITY MMT:  MMT Right eval Left eval  Hip flexion 4+ 4+  Hip extension 4 4  Hip abduction 4+ 4+  Hip adduction 4+ 4+   Knee flexion 4+ 5  Knee extension 5 5  Ankle dorsiflexion 5 5  Ankle plantarflexion 4+* 5  Ankle inversion    Ankle eversion     (Blank rows = not tested)  LOWER EXTREMITY ROM:  AROM Right eval Left eval  Hip flexion    Hip extension    Hip abduction    Hip adduction    Hip internal rotation 10 20  Hip external rotation    Knee flexion    Knee extension    Ankle dorsiflexion 0 20  Ankle plantarflexion    Ankle inversion    Ankle eversion     (Blank rows = not tested)  LOWER EXTREMITY SPECIAL TESTS:  Knee special tests: Anterior drawer test: negative, Posterior drawer test: negative, and McMurray's test: negative  FUNCTIONAL TESTS:  5 times sit to stand: 11 seconds  GAIT: Distance walked: 58' Assistive device utilized: None Level of assistance: Complete Independence Comments: no  significant deviation or device    TODAY'S TREATMENT: 01/13/22 Therapeutic Exercise: Recumbent Bike L3x81mn Standing hip abduction x 10 red TB above knees Standing hip extension red TB above knees x 10 Standing hip flexion red TB above knee x 10 Side steps 2x20 ft red TB above knees  Step ups on 6' step x 10 bil Seated R ankle PF, DF, EV with red TB x 10  Manual Therapy: STM and TPR to distal lateral quads, peroneals 01/08/22 Therapeutic Exercise: to improve strength and mobility.  Demo, verbal and tactile cues throughout for technique. Bike L2 x 6 min  Heel stretch on step 2 x 30 sec SL taps on 1" step x 10 forward each side Squats with RTB x 20  Bridges with RTB x 20 S/L Clamshells RTB x 15 bil  SLR 2 x 10 bil  Bird dogs x 10 bil  Prone extension 2 x 10 bil  Seated - toe spreading, yoga toes (  extending great toe, extending lesser toes) great toe abduction.    01/06/22 TherEx: Seated hamstring stretch x 30 sec bil Standing gastroc stretch at step x 30 sec bil Seated heel/toe raise x 10 bil Supine bridge + red TB x 10 Supine clam x 10 alt bent knee fallout red TB Supine SLR x 10 bil LTR 3x10" bil Supine figure 4 stretch 2x30 sec  12/24/2021 Modalities: CP to R foot x 10 min     PATIENT EDUCATION:  Education details: progressed HEP, issued RTB Person educated: Patient Education method: Consulting civil engineer, Demonstration, Verbal cues, and Handouts Education comprehension: verbalized understanding and returned demonstration   HOME EXERCISE PROGRAM: Access Code: [email or web address]  ASSESSMENT:  CLINICAL IMPRESSION:  Pt demonstrated good response to treatment. Initially had reports of discomfort along lateral R knee, with palpation TP were found along distal VL and peroneals. After MT, pt noted improvement in discomfort and more flexibility in R knee. TE focused on proximal strengthening, functional strengthening, and isolated ankle strengthening. No issues with exercises, pt would be  fine with more progression of TE.    OBJECTIVE IMPAIRMENTS decreased activity tolerance, decreased endurance, decreased ROM, decreased strength, increased fascial restrictions, increased muscle spasms, impaired flexibility, and pain.   ACTIVITY LIMITATIONS bending, standing, squatting, stairs, and locomotion level  PARTICIPATION LIMITATIONS: community activity and sports  PERSONAL FACTORS 1-2 comorbidities: history of post-concussion syndrome and vertigo, IBS, migraines  are also affecting patient's functional outcome.   REHAB POTENTIAL: Good  CLINICAL DECISION MAKING: Stable/uncomplicated  EVALUATION COMPLEXITY: Low   GOALS: Goals reviewed with patient? Yes  SHORT TERM GOALS: Target date: 01/07/2022   Patient will be independent with initial HEP. Baseline: no HEP Goal status: IN PROGRESS - 01/13/22 partial HEP compliance    LONG TERM GOALS: Target date: 02/04/2022   Patient will be independent with advanced/ongoing HEP to improve outcomes and carryover.  Baseline:  Goal status: IN PROGRESS  2.  Patient will report at least 75% improvement in R knee pain with activity. Baseline: up to 7/10 pain with activity Goal status: IN PROGRESS  3.  Patient will demonstrate 5/5 hip strength to support knee and improve kinematics.  Baseline: see objective Goal status: IN PROGRESS  4. Patient will be able to ascend/descend stairs with 1 HR and reciprocal step pattern  without increased knee pain to access.  Baseline: pain descending Goal status: IN PROGRESS  5.  Patient will report >56/80 on LEFS to demonstrate improved functional ability. Baseline: 47/80 Goal status: IN PROGRESS  6. Patient will demonstrate R hip and ankle AROM = LLE AROM.   Baseline: tightness R hip IR, R ankle DF Goal status: IN PROGRESS   7.  Patient will be able to run x 50' without increased R knee/foot pain.  Baseline: unable Goal status: IN PROGRESS    PLAN: PT FREQUENCY: 1-2x/week  PT  DURATION: 6 weeks  PLANNED INTERVENTIONS: Therapeutic exercises, Therapeutic activity, Neuromuscular re-education, Balance training, Gait training, Patient/Family education, Self Care, Joint mobilization, Stair training, Orthotic/Fit training, Dry Needling, Electrical stimulation, Cryotherapy, Moist heat, scar mobilization, Taping, Vasopneumatic device, Ultrasound, Ionotophoresis '4mg'$ /ml Dexamethasone, Manual therapy, and Re-evaluation  PLAN FOR NEXT SESSION: review HEP for LE strengthening, focusing on hip strength, R ankle ROM, manual therapy and modalities PRN.    Artist Pais, PTA 01/13/2022, 5:13 PM

## 2022-01-15 ENCOUNTER — Encounter: Payer: Self-pay | Admitting: Physical Therapy

## 2022-01-15 ENCOUNTER — Ambulatory Visit: Payer: 59 | Admitting: Physical Therapy

## 2022-01-15 DIAGNOSIS — M79671 Pain in right foot: Secondary | ICD-10-CM

## 2022-01-15 DIAGNOSIS — M25561 Pain in right knee: Secondary | ICD-10-CM | POA: Diagnosis not present

## 2022-01-15 DIAGNOSIS — M6281 Muscle weakness (generalized): Secondary | ICD-10-CM

## 2022-01-15 NOTE — Therapy (Signed)
OUTPATIENT PHYSICAL THERAPY TREATMENT   Patient Name: [name] MRN: [id number] DOB:1966/01/13, 56 y.o., female Today's Date: 01/15/2022   PT End of Session - 01/15/22 1621     Visit Number 5    Number of Visits 12    Date for PT Re-Evaluation 02/04/22    Authorization Type [employer] [id number] used 7 previously    PT Start Time 1620    PT Stop Time 1884    PT Time Calculation (min) 39 min    Activity Tolerance Patient tolerated treatment well    Behavior During Therapy WFL for tasks assessed/performed               Past Medical History:  Diagnosis Date   Allergic rhinitis    Anemia    Anxiety    Asthma    Contact dermatitis and eczema    Diverticulitis    CT Scan   Esophageal reflux    H. pylori infection    Hyperlipidemia, mixed    IBS (irritable bowel syndrome)    IC (interstitial cystitis)    Internal hemorrhoids    Lumbago    Mitral valve disorders(424.0)    Osteoarthritis 07/08/2015   Post concussive syndrome    Seasonal and perennial allergic rhinitis 01/20/2015   Past Surgical History:  Procedure Laterality Date   BARTHOLIN CYST MARSUPIALIZATION N/A 12/18/2013   Procedure: BARTHOLIN CYST MARSUPIALIZATION WITH BIOPSY;  Surgeon: Lovenia Kim, MD;  Location: [facility];  Service: Gynecology;  Laterality: N/A;   CESAREAN SECTION     DILITATION & CURRETTAGE/HYSTROSCOPY WITH NOVASURE ABLATION N/A 10/14/2012   Procedure: DILATATION & CURETTAGE/HYSTEROSCOPY WITH NOVASURE ABLATION;  Surgeon: Lovenia Kim, MD;  Location: [facility];  Service: Gynecology;  Laterality: N/A;   MOUTH SURGERY     SINUS SURGERY WITH INSTATRAK     Patient Active Problem List   Diagnosis Date Noted   Closed fracture of phalanx of right great toe 11/19/2021   Raynaud disease 08/26/2021   renal insufficiency 01/07/2021   Varicose veins of both lower extremities 10/28/2020   Hemorrhoids 10/28/2020   Hyperglycemia 04/01/2020   Nonintractable headache 12/31/2019   Right leg pain 11/19/2019    Constipation 11/17/2019   Neck pain 09/25/2019   Educated about COVID-19 virus infection 06/21/2019   Insomnia 02/19/2019   Anxiety and depression 01/13/2019   Perimenopausal 16/60/6301   History of Helicobacter pylori infection 12/28/2017   Nocturia 07/19/2016   Rash and nonspecific skin eruption 03/21/2016   Post concussive syndrome 01/22/2016   Osteoarthritis 07/08/2015   Hematuria 01/20/2015   Hyperlipidemia, mild 01/20/2015   Seasonal and perennial allergic rhinitis 01/20/2015   Fatigue 10/24/2014   Arthralgia 08/20/2013   Diverticulosis 07/01/2011   Menopausal state 03/25/2011   Fibroids 10/24/2010   Anemia 03/25/2010   IRRITABLE BOWEL SYNDROME 03/25/2010   Mitral valve disorder 07/09/2007   Allergic asthma, mild intermittent, uncomplicated 60/01/9322   ESOPHAGEAL REFLUX 07/09/2007   LOW BACK PAIN 12/14/2006    PCP: Mosie Lukes., MD  REFERRING PROVIDER: Mosie Lukes., MD  REFERRING DIAG: [phone] (ICD-10-CM) - Arthralgia of right foot M79.604 (ICD-10-CM) - Right leg pain  THERAPY DIAG:  Acute pain of right knee  Pain in right foot  Muscle weakness (generalized)  Rationale for Evaluation and Treatment Rehabilitation  ONSET DATE: 09/25/2021  SUBJECTIVE:   SUBJECTIVE STATEMENT: Just has pain in the tip of her R great toe today.  No knee pain, feels stiff after exercise.   PERTINENT HISTORY: post concussion syndrome, R  great toe fracture, history migraines,  PAIN:  Are you having pain? Yes: NPRS scale: 0/10 Pain location: R great toe Pain description: uncomfortable Aggravating factors: running, yoga, bending knee, going down stairs Relieving factors: ice and rest  PRECAUTIONS: None  WEIGHT BEARING RESTRICTIONS No  FALLS:  Has patient fallen in last 6 months? Yes. Number of falls 1, slipped turning around.   LIVING ENVIRONMENT: Lives with: lives with their family Lives in: House/apartment Stairs: Yes: Internal: 12 steps; on right going  up Has following equipment at home: None  OCCUPATION: social services/medicaid eligibility specialist  PLOF: Independent used to do Pilates and run daily  PATIENT GOALS build up strength in right leg, be able to run again.    OBJECTIVE:   DIAGNOSTIC FINDINGS:  09/26/2021 Right great toe Xray IMPRESSION: Acute nondisplaced intra-articular fracture involving the base of the first distal phalanx.  12/16/2021 Right knee Xray: FINDINGS: No evidence of fracture, dislocation, or joint effusion. No evidence of arthropathy or other focal bone abnormality. Soft tissues are unremarkable.    PATIENT SURVEYS:  LEFS 47/80  COGNITION:  Overall cognitive status: Within functional limits for tasks assessed     SENSATION: WFL  EDEMA:  NA  MUSCLE LENGTH: Hamstrings: Right 90 deg; Left 90 deg  POSTURE: rounded shoulders and forward head  PALPATION: Tenderness R LCL, R vastus lateralis  LOWER EXTREMITY MMT:  MMT Right eval Left eval  Hip flexion 4+ 4+  Hip extension 4 4  Hip abduction 4+ 4+  Hip adduction 4+ 4+   Knee flexion 4+ 5  Knee extension 5 5  Ankle dorsiflexion 5 5  Ankle plantarflexion 4+* 5  Ankle inversion    Ankle eversion     (Blank rows = not tested)  LOWER EXTREMITY ROM:  AROM Right eval Left eval  Hip flexion    Hip extension    Hip abduction    Hip adduction    Hip internal rotation 10 20  Hip external rotation    Knee flexion    Knee extension    Ankle dorsiflexion 0 20  Ankle plantarflexion    Ankle inversion    Ankle eversion     (Blank rows = not tested)  LOWER EXTREMITY SPECIAL TESTS:  Knee special tests: Anterior drawer test: negative, Posterior drawer test: negative, and McMurray's test: negative  FUNCTIONAL TESTS:  5 times sit to stand: 11 seconds  GAIT: Distance walked: 71' Assistive device utilized: None Level of assistance: Complete Independence Comments: no significant deviation or device    TODAY'S TREATMENT: 01/15/2022  Therapeutic Exercise: to improve strength and mobility.  Bike L3 x 6 min Lunges with foot on slider - lateral x 10 bil, forward x 10 bil, backwards x 15 bil  Standing quad stretch 2 x 30 sec bil  Single leg touch down squats on 2" step 2 x 10 bil  Bird dogs 2 x 10  Hip 90/90s x 10 alternating sides   01/13/22 Therapeutic Exercise: Recumbent Bike L3x9mn Standing hip abduction x 10 red TB above knees Standing hip extension red TB above knees x 10 Standing hip flexion red TB above knee x 10 Side steps 2x20 ft red TB above knees  Step ups on 6' step x 10 bil Seated R ankle PF, DF, EV with red TB x 10  Manual Therapy: STM and TPR to distal lateral quads, peroneals 01/08/22 Therapeutic Exercise: to improve strength and mobility.  Demo, verbal and tactile cues throughout for technique. Bike L2 x 6 min  Heel stretch on step 2 x 30 sec SL taps on 1" step x 10 forward each side Squats with RTB x 20  Bridges with RTB x 20 S/L Clamshells RTB x 15 bil  SLR 2 x 10 bil  Bird dogs x 10 bil  Prone extension 2 x 10 bil  Seated - toe spreading, yoga toes (extending great toe, extending lesser toes) great toe abduction.    01/06/22 TherEx: Seated hamstring stretch x 30 sec bil Standing gastroc stretch at step x 30 sec bil Seated heel/toe raise x 10 bil Supine bridge + red TB x 10 Supine clam x 10 alt bent knee fallout red TB Supine SLR x 10 bil LTR 3x10" bil Supine figure 4 stretch 2x30 sec  12/24/2021 Modalities: CP to R foot x 10 min     PATIENT EDUCATION:  Education details: progressed HEP 01/15/2022 Person educated: Patient Education method: Explanation, Demonstration, Verbal cues, and Handouts Education comprehension: verbalized understanding and returned demonstration   HOME EXERCISE PROGRAM: Access Code: [email or web address]  ASSESSMENT:  CLINICAL IMPRESSION: Lori-Ann Lindfors is making good progress, reporting no knee pain only stiffness now.  She tolerated progression of exercises,  able to step down 2" step today without any knee pain.  Added some hip/knee stretches as also reported stiffness in hips with yoga poses.  Dora Simeone continues to demonstrate potential for improvement and would benefit from continued skilled therapy to address impairments.     OBJECTIVE IMPAIRMENTS decreased activity tolerance, decreased endurance, decreased ROM, decreased strength, increased fascial restrictions, increased muscle spasms, impaired flexibility, and pain.   ACTIVITY LIMITATIONS bending, standing, squatting, stairs, and locomotion level  PARTICIPATION LIMITATIONS: community activity and sports  PERSONAL FACTORS 1-2 comorbidities: history of post-concussion syndrome and vertigo, IBS, migraines  are also affecting patient's functional outcome.   REHAB POTENTIAL: Good  CLINICAL DECISION MAKING: Stable/uncomplicated  EVALUATION COMPLEXITY: Low   GOALS: Goals reviewed with patient? Yes  SHORT TERM GOALS: Target date: 01/07/2022   Patient will be independent with initial HEP. Baseline: no HEP Goal status: IN PROGRESS - 01/13/22 partial HEP compliance    LONG TERM GOALS: Target date: 02/04/2022   Patient will be independent with advanced/ongoing HEP to improve outcomes and carryover.  Baseline:  Goal status: IN PROGRESS  2.  Patient will report at least 75% improvement in R knee pain with activity. Baseline: up to 7/10 pain with activity Goal status: IN PROGRESS  3.  Patient will demonstrate 5/5 hip strength to support knee and improve kinematics.  Baseline: see objective Goal status: IN PROGRESS  4. Patient will be able to ascend/descend stairs with 1 HR and reciprocal step pattern  without increased knee pain to access.  Baseline: pain descending Goal status: IN PROGRESS  5.  Patient will report >56/80 on LEFS to demonstrate improved functional ability. Baseline: 47/80 Goal status: IN PROGRESS  6. Patient will demonstrate R hip and ankle AROM = LLE AROM.    Baseline: tightness R hip IR, R ankle DF Goal status: IN PROGRESS   7.  Patient will be able to run x 50' without increased R knee/foot pain.  Baseline: unable Goal status: IN PROGRESS    PLAN: PT FREQUENCY: 1-2x/week  PT DURATION: 6 weeks  PLANNED INTERVENTIONS: Therapeutic exercises, Therapeutic activity, Neuromuscular re-education, Balance training, Gait training, Patient/Family education, Self Care, Joint mobilization, Stair training, Orthotic/Fit training, Dry Needling, Electrical stimulation, Cryotherapy, Moist heat, scar mobilization, Taping, Vasopneumatic device, Ultrasound, Ionotophoresis '4mg'$ /ml Dexamethasone, Manual therapy, and Re-evaluation  PLAN FOR NEXT  SESSION: review HEP for LE strengthening, focusing on hip strength, R ankle ROM, manual therapy and modalities PRN.    Rennie Natter, PT 01/15/2022, 5:06 PM

## 2022-01-19 ENCOUNTER — Encounter: Payer: Self-pay | Admitting: Physical Therapy

## 2022-01-19 ENCOUNTER — Ambulatory Visit: Payer: 59 | Admitting: Physical Therapy

## 2022-01-19 DIAGNOSIS — M6281 Muscle weakness (generalized): Secondary | ICD-10-CM

## 2022-01-19 DIAGNOSIS — M25561 Pain in right knee: Secondary | ICD-10-CM

## 2022-01-19 DIAGNOSIS — M79671 Pain in right foot: Secondary | ICD-10-CM

## 2022-01-19 NOTE — Therapy (Signed)
OUTPATIENT PHYSICAL THERAPY TREATMENT   Patient Name: [name] MRN: [id number] DOB:1965-05-07, 56 y.o., female Today's Date: 01/19/2022   PT End of Session - 01/19/22 1622     Visit Number 6    Number of Visits 12    Date for PT Re-Evaluation 02/04/22    Authorization Type U[employer] [id number] used 7 previously    PT Start Time 1619    PT Stop Time 1703    PT Time Calculation (min) 44 min    Activity Tolerance Patient tolerated treatment well    Behavior During Therapy WFL for tasks assessed/performed               Past Medical History:  Diagnosis Date   Allergic rhinitis    Anemia    Anxiety    Asthma    Contact dermatitis and eczema    Diverticulitis    CT Scan   Esophageal reflux    H. pylori infection    Hyperlipidemia, mixed    IBS (irritable bowel syndrome)    IC (interstitial cystitis)    Internal hemorrhoids    Lumbago    Mitral valve disorders(424.0)    Osteoarthritis 07/08/2015   Post concussive syndrome    Seasonal and perennial allergic rhinitis 01/20/2015   Past Surgical History:  Procedure Laterality Date   BARTHOLIN CYST MARSUPIALIZATION N/A 12/18/2013   Procedure: BARTHOLIN CYST MARSUPIALIZATION WITH BIOPSY;  Surgeon: Lovenia Kim, MD;  Location: [facility];  Service: Gynecology;  Laterality: N/A;   CESAREAN SECTION     DILITATION & CURRETTAGE/HYSTROSCOPY WITH NOVASURE ABLATION N/A 10/14/2012   Procedure: DILATATION & CURETTAGE/HYSTEROSCOPY WITH NOVASURE ABLATION;  Surgeon: Lovenia Kim, MD;  Location: [facility];  Service: Gynecology;  Laterality: N/A;   MOUTH SURGERY     SINUS SURGERY WITH INSTATRAK     Patient Active Problem List   Diagnosis Date Noted   Closed fracture of phalanx of right great toe 11/19/2021   Raynaud disease 08/26/2021   renal insufficiency 01/07/2021   Varicose veins of both lower extremities 10/28/2020   Hemorrhoids 10/28/2020   Hyperglycemia 04/01/2020   Nonintractable headache 12/31/2019   Right leg pain 11/19/2019    Constipation 11/17/2019   Neck pain 09/25/2019   Educated about COVID-19 virus infection 06/21/2019   Insomnia 02/19/2019   Anxiety and depression 01/13/2019   Perimenopausal 41/96/2229   History of Helicobacter pylori infection 12/28/2017   Nocturia 07/19/2016   Rash and nonspecific skin eruption 03/21/2016   Post concussive syndrome 01/22/2016   Osteoarthritis 07/08/2015   Hematuria 01/20/2015   Hyperlipidemia, mild 01/20/2015   Seasonal and perennial allergic rhinitis 01/20/2015   Fatigue 10/24/2014   Arthralgia 08/20/2013   Diverticulosis 07/01/2011   Menopausal state 03/25/2011   Fibroids 10/24/2010   Anemia 03/25/2010   IRRITABLE BOWEL SYNDROME 03/25/2010   Mitral valve disorder 07/09/2007   Allergic asthma, mild intermittent, uncomplicated 79/89/2119   ESOPHAGEAL REFLUX 07/09/2007   LOW BACK PAIN 12/14/2006    PCP: Mosie Lukes., MD  REFERRING PROVIDER: Mosie Lukes., MD  REFERRING DIAG: [id number] (ICD-10-CM) - Arthralgia of right foot M79.604 (ICD-10-CM) - Right leg pain  THERAPY DIAG:  Acute pain of right knee  Pain in right foot  Muscle weakness (generalized)  Rationale for Evaluation and Treatment Rehabilitation  ONSET DATE: 09/25/2021  SUBJECTIVE:   SUBJECTIVE STATEMENT: Did a pilates class on Saturday and Sunday, today reporting soreness in both quads.    PERTINENT HISTORY: post concussion syndrome, R great toe fracture, history migraines,  PAIN:  Are you having pain? No occasion pinching feeling in R toe.   PRECAUTIONS: None  WEIGHT BEARING RESTRICTIONS No  FALLS:  Has patient fallen in last 6 months? Yes. Number of falls 1, slipped turning around.   LIVING ENVIRONMENT: Lives with: lives with their family Lives in: House/apartment Stairs: Yes: Internal: 12 steps; on right going up Has following equipment at home: None  OCCUPATION: social services/medicaid eligibility specialist  PLOF: Independent used to do Pilates and run  daily  PATIENT GOALS build up strength in right leg, be able to run again.    OBJECTIVE:   DIAGNOSTIC FINDINGS:  09/26/2021 Right great toe Xray IMPRESSION: Acute nondisplaced intra-articular fracture involving the base of the first distal phalanx.  12/16/2021 Right knee Xray: FINDINGS: No evidence of fracture, dislocation, or joint effusion. No evidence of arthropathy or other focal bone abnormality. Soft tissues are unremarkable.    PATIENT SURVEYS:  LEFS 47/80  COGNITION:  Overall cognitive status: Within functional limits for tasks assessed     SENSATION: WFL  EDEMA:  NA  MUSCLE LENGTH: Hamstrings: Right 90 deg; Left 90 deg  POSTURE: rounded shoulders and forward head  PALPATION: Tenderness R LCL, R vastus lateralis  LOWER EXTREMITY MMT:  MMT Right eval Left eval  Hip flexion 4+ 4+  Hip extension 4 4  Hip abduction 4+ 4+  Hip adduction 4+ 4+   Knee flexion 4+ 5  Knee extension 5 5  Ankle dorsiflexion 5 5  Ankle plantarflexion 4+* 5  Ankle inversion    Ankle eversion     (Blank rows = not tested)  LOWER EXTREMITY ROM:  AROM Right eval Left eval  Hip flexion    Hip extension    Hip abduction    Hip adduction    Hip internal rotation 10 20  Hip external rotation    Knee flexion    Knee extension    Ankle dorsiflexion 0 20  Ankle plantarflexion    Ankle inversion    Ankle eversion     (Blank rows = not tested)  LOWER EXTREMITY SPECIAL TESTS:  Knee special tests: Anterior drawer test: negative, Posterior drawer test: negative, and McMurray's test: negative  FUNCTIONAL TESTS:  5 times sit to stand: 11 seconds  GAIT: Distance walked: 87' Assistive device utilized: None Level of assistance: Complete Independence Comments: no significant deviation or device    TODAY'S TREATMENT: 01/19/2022 Therapeutic Exercise: to improve strength and mobility.  Demo, verbal and tactile cues throughout for technique. Elliptical x 5 min  Lunges with foot  on slider - lateral x 10 bil, forward x 10 bil, backwards x 10 bil   Star excursion pattern 360 deg x 3 each    Toe raises on wall 2 x 10    Wall squats 5 x 15 sec  Neuromuscular Reeducation: to improve balance and stability. SBA for safety throughout.  On airex in corner: Ankle sways x 20 Church pews x 20 Tandem stance 2 x 15 sec bil  SLS 2 x 10 sec bil  Tandem walking on airex balance beam   01/15/2022 Therapeutic Exercise: to improve strength and mobility.  Bike L3 x 6 min Lunges with foot on slider - lateral x 10 bil, forward x 10 bil, backwards x 15 bil  Standing quad stretch 2 x 30 sec bil  Single leg touch down squats on 2" step 2 x 10 bil  Bird dogs 2 x 10  Hip 90/90s x 10 alternating sides   01/13/22  Therapeutic Exercise: Recumbent Bike L3x89mn Standing hip abduction x 10 red TB above knees Standing hip extension red TB above knees x 10 Standing hip flexion red TB above knee x 10 Side steps 2x20 ft red TB above knees  Step ups on 6' step x 10 bil Seated R ankle PF, DF, EV with red TB x 10  Manual Therapy: STM and TPR to distal lateral quads, peroneals  PATIENT EDUCATION:  Education details: progressed HEP 9/21, 01/19/22.  Info on purchasing balance pad.  Person educated: Patient Education method: Explanation, Demonstration, Verbal cues, and Handouts Education comprehension: verbalized understanding and returned demonstration   HOME EXERCISE PROGRAM: Access Code: [email or web address] ASSESSMENT:  CLINICAL IMPRESSION: RAniella Wandreycontinues to make good progress and tolerated exercises well without knee or toe pain.  She was challenged with activities on balance pad, noted R ankle weakness with tandem and SLS on airex.   Overall very happy with improved strength and tolerance to exercise.  RBailyn Spackmancontinues to demonstrate potential for improvement and would benefit from continued skilled therapy to address impairments.     OBJECTIVE IMPAIRMENTS decreased activity  tolerance, decreased endurance, decreased ROM, decreased strength, increased fascial restrictions, increased muscle spasms, impaired flexibility, and pain.   ACTIVITY LIMITATIONS bending, standing, squatting, stairs, and locomotion level  PARTICIPATION LIMITATIONS: community activity and sports  PERSONAL FACTORS 1-2 comorbidities: history of post-concussion syndrome and vertigo, IBS, migraines  are also affecting patient's functional outcome.   REHAB POTENTIAL: Good  CLINICAL DECISION MAKING: Stable/uncomplicated  EVALUATION COMPLEXITY: Low   GOALS: Goals reviewed with patient? Yes  SHORT TERM GOALS: Target date: 01/07/2022   Patient will be independent with initial HEP. Baseline: no HEP Goal status: MET -01/19/22 good compliance.     LONG TERM GOALS: Target date: 02/04/2022   Patient will be independent with advanced/ongoing HEP to improve outcomes and carryover.  Baseline:  Goal status: IN PROGRESS  2.  Patient will report at least 75% improvement in R knee pain with activity. Baseline: up to 7/10 pain with activity Goal status: IN PROGRESS  3.  Patient will demonstrate 5/5 hip strength to support knee and improve kinematics.  Baseline: see objective Goal status: IN PROGRESS  4. Patient will be able to ascend/descend stairs with 1 HR and reciprocal step pattern  without increased knee pain to access.  Baseline: pain descending Goal status: IN PROGRESS  5.  Patient will report >56/80 on LEFS to demonstrate improved functional ability. Baseline: 47/80 Goal status: IN PROGRESS  6. Patient will demonstrate R hip and ankle AROM = LLE AROM.   Baseline: tightness R hip IR, R ankle DF Goal status: IN PROGRESS   7.  Patient will be able to run x 50' without increased R knee/foot pain.  Baseline: unable Goal status: IN PROGRESS    PLAN: PT FREQUENCY: 1-2x/week  PT DURATION: 6 weeks  PLANNED INTERVENTIONS: Therapeutic exercises, Therapeutic activity, Neuromuscular  re-education, Balance training, Gait training, Patient/Family education, Self Care, Joint mobilization, Stair training, Orthotic/Fit training, Dry Needling, Electrical stimulation, Cryotherapy, Moist heat, scar mobilization, Taping, Vasopneumatic device, Ultrasound, Ionotophoresis 459mml Dexamethasone, Manual therapy, and Re-evaluation  PLAN FOR NEXT SESSION: LE strengthening, focusing on hip strength, R ankle ROM, manual therapy and modalities PRN.    ElRennie NatterPT, DPT 01/19/2022, 6:00 PM

## 2022-01-22 ENCOUNTER — Ambulatory Visit: Payer: 59 | Admitting: Physical Therapy

## 2022-01-22 ENCOUNTER — Encounter: Payer: Self-pay | Admitting: Physical Therapy

## 2022-01-22 DIAGNOSIS — M25561 Pain in right knee: Secondary | ICD-10-CM

## 2022-01-22 DIAGNOSIS — M6281 Muscle weakness (generalized): Secondary | ICD-10-CM

## 2022-01-22 DIAGNOSIS — M79671 Pain in right foot: Secondary | ICD-10-CM

## 2022-01-22 NOTE — Therapy (Signed)
OUTPATIENT PHYSICAL THERAPY TREATMENT   Patient Name: [name] MRN: [id number] DOB:October 11, 1965, 56 y.o., female Today's Date: 01/22/2022   PT End of Session - 01/22/22 1709     Visit Number 7    Number of Visits 12    Date for PT Re-Evaluation 02/04/22    Authorization Type [employer] [id number] used 7 previously    PT Start Time 1706    PT Stop Time 0071    PT Time Calculation (min) 43 min    Activity Tolerance Patient tolerated treatment well    Behavior During Therapy WFL for tasks assessed/performed               Past Medical History:  Diagnosis Date   Allergic rhinitis    Anemia    Anxiety    Asthma    Contact dermatitis and eczema    Diverticulitis    CT Scan   Esophageal reflux    H. pylori infection    Hyperlipidemia, mixed    IBS (irritable bowel syndrome)    IC (interstitial cystitis)    Internal hemorrhoids    Lumbago    Mitral valve disorders(424.0)    Osteoarthritis 07/08/2015   Post concussive syndrome    Seasonal and perennial allergic rhinitis 01/20/2015   Past Surgical History:  Procedure Laterality Date   BARTHOLIN CYST MARSUPIALIZATION N/A 12/18/2013   Procedure: BARTHOLIN CYST MARSUPIALIZATION WITH BIOPSY;  Surgeon: Lovenia Kim, MD;  Location: [facility];  Service: Gynecology;  Laterality: N/A;   CESAREAN SECTION     DILITATION & CURRETTAGE/HYSTROSCOPY WITH NOVASURE ABLATION N/A 10/14/2012   Procedure: DILATATION & CURETTAGE/HYSTEROSCOPY WITH NOVASURE ABLATION;  Surgeon: Lovenia Kim, MD;  Location: [facility];  Service: Gynecology;  Laterality: N/A;   MOUTH SURGERY     SINUS SURGERY WITH INSTATRAK     Patient Active Problem List   Diagnosis Date Noted   Closed fracture of phalanx of right great toe 11/19/2021   Raynaud disease 08/26/2021   renal insufficiency 01/07/2021   Varicose veins of both lower extremities 10/28/2020   Hemorrhoids 10/28/2020   Hyperglycemia 04/01/2020   Nonintractable headache 12/31/2019   Right leg pain 11/19/2019    Constipation 11/17/2019   Neck pain 09/25/2019   Educated about COVID-19 virus infection 06/21/2019   Insomnia 02/19/2019   Anxiety and depression 01/13/2019   Perimenopausal 21/97/5883   History of Helicobacter pylori infection 12/28/2017   Nocturia 07/19/2016   Rash and nonspecific skin eruption 03/21/2016   Post concussive syndrome 01/22/2016   Osteoarthritis 07/08/2015   Hematuria 01/20/2015   Hyperlipidemia, mild 01/20/2015   Seasonal and perennial allergic rhinitis 01/20/2015   Fatigue 10/24/2014   Arthralgia 08/20/2013   Diverticulosis 07/01/2011   Menopausal state 03/25/2011   Fibroids 10/24/2010   Anemia 03/25/2010   IRRITABLE BOWEL SYNDROME 03/25/2010   Mitral valve disorder 07/09/2007   Allergic asthma, mild intermittent, uncomplicated 25/49/8264   ESOPHAGEAL REFLUX 07/09/2007   LOW BACK PAIN 12/14/2006    PCP: Mosie Lukes., MD  REFERRING PROVIDER: Mosie Lukes., MD  REFERRING DIAG: [id number] (ICD-10-CM) - Arthralgia of right foot M79.604 (ICD-10-CM) - Right leg pain  THERAPY DIAG:  Acute pain of right knee  Pain in right foot  Muscle weakness (generalized)  Rationale for Evaluation and Treatment Rehabilitation  ONSET DATE: 09/25/2021  SUBJECTIVE:   SUBJECTIVE STATEMENT: Reporting more cramping in legs.  Did pilates last night. R hamstring cramped with R quad stretch.   PERTINENT HISTORY: post concussion syndrome, R great toe fracture,  history migraines,  PAIN:  Are you having pain? No   PRECAUTIONS: None  WEIGHT BEARING RESTRICTIONS No  FALLS:  Has patient fallen in last 6 months? Yes. Number of falls 1, slipped turning around.   LIVING ENVIRONMENT: Lives with: lives with their family Lives in: House/apartment Stairs: Yes: Internal: 12 steps; on right going up Has following equipment at home: None  OCCUPATION: social services/medicaid eligibility specialist  PLOF: Independent used to do Pilates and run daily  PATIENT GOALS  build up strength in right leg, be able to run again.    OBJECTIVE:   DIAGNOSTIC FINDINGS:  09/26/2021 Right great toe Xray IMPRESSION: Acute nondisplaced intra-articular fracture involving the base of the first distal phalanx.  12/16/2021 Right knee Xray: FINDINGS: No evidence of fracture, dislocation, or joint effusion. No evidence of arthropathy or other focal bone abnormality. Soft tissues are unremarkable.    PATIENT SURVEYS:  LEFS 47/80  COGNITION:  Overall cognitive status: Within functional limits for tasks assessed     SENSATION: WFL  EDEMA:  NA  MUSCLE LENGTH: Hamstrings: Right 90 deg; Left 90 deg  POSTURE: rounded shoulders and forward head  PALPATION: Tenderness R LCL, R vastus lateralis  LOWER EXTREMITY MMT:  MMT Right eval Left eval  Hip flexion 4+ 4+  Hip extension 4 4  Hip abduction 4+ 4+  Hip adduction 4+ 4+   Knee flexion 4+ 5  Knee extension 5 5  Ankle dorsiflexion 5 5  Ankle plantarflexion 4+* 5  Ankle inversion    Ankle eversion     (Blank rows = not tested)  LOWER EXTREMITY ROM:  AROM Right eval Left eval  Hip flexion    Hip extension    Hip abduction    Hip adduction    Hip internal rotation 10 20  Hip external rotation    Knee flexion    Knee extension    Ankle dorsiflexion 0 20  Ankle plantarflexion    Ankle inversion    Ankle eversion     (Blank rows = not tested)  LOWER EXTREMITY SPECIAL TESTS:  Knee special tests: Anterior drawer test: negative, Posterior drawer test: negative, and McMurray's test: negative  FUNCTIONAL TESTS:  5 times sit to stand: 11 seconds  GAIT: Distance walked: 73' Assistive device utilized: None Level of assistance: Complete Independence Comments: no significant deviation or device    TODAY'S TREATMENT: 01/22/2022 Therapeutic Exercise: to improve strength and mobility.  Demo, verbal and tactile cues throughout for technique. Treadmill 2 mph x 8 min  Hip 90/90s 2 x 10  Hip extension  from 90/90s x 5 bil  Hamstring curls on ball 2 x 10  Self STM with foam roller to low back - full extension x 10 Self STM with foam roller to hamstrings and calves x 10  Manual Therapy: to decrease muscle spasm and pain and improve mobility IASTM with s/s tools to bil hamstrings, calves, R quad.   01/19/2022 Therapeutic Exercise: to improve strength and mobility.  Demo, verbal and tactile cues throughout for technique. Elliptical x 5 min  Lunges with foot on slider - lateral x 10 bil, forward x 10 bil, backwards x 10 bil   Star excursion pattern 360 deg x 3 each    Toe raises on wall 2 x 10    Wall squats 5 x 15 sec  Neuromuscular Reeducation: to improve balance and stability. SBA for safety throughout.  On airex in corner: Ankle sways x 20 Church pews x 20 Tandem stance  2 x 15 sec bil  SLS 2 x 10 sec bil  Tandem walking on airex balance beam   01/15/2022 Therapeutic Exercise: to improve strength and mobility.  Bike L3 x 6 min Lunges with foot on slider - lateral x 10 bil, forward x 10 bil, backwards x 15 bil  Standing quad stretch 2 x 30 sec bil  Single leg touch down squats on 2" step 2 x 10 bil  Bird dogs 2 x 10  Hip 90/90s x 10 alternating sides   01/13/22 Therapeutic Exercise: Recumbent Bike L3x68mn Standing hip abduction x 10 red TB above knees Standing hip extension red TB above knees x 10 Standing hip flexion red TB above knee x 10 Side steps 2x20 ft red TB above knees  Step ups on 6' step x 10 bil Seated R ankle PF, DF, EV with red TB x 10  Manual Therapy: STM and TPR to distal lateral quads, peroneals  PATIENT EDUCATION:  Education details: progressed HEP 9/21, 01/19/22.  Info on purchasing balance pad.  Person educated: Patient Education method: Explanation, Demonstration, Verbal cues, and Handouts Education comprehension: verbalized understanding and returned demonstration   HOME EXERCISE PROGRAM: Access Code: [email or web address] ASSESSMENT:  CLINICAL  IMPRESSION: RTrameka Doroughcontinues to make good progress but reporting more cramping.  She has increased activity level significantly which may be related, but focused exercises today including self STM with foam roller to decrease tightness as well as focusing on hip mobility and hamstring strengthening.  She tolerated interventions well and reported decreased discomfort following.   RDaily Doecontinues to demonstrate potential for improvement and would benefit from continued skilled therapy to address impairments.     OBJECTIVE IMPAIRMENTS decreased activity tolerance, decreased endurance, decreased ROM, decreased strength, increased fascial restrictions, increased muscle spasms, impaired flexibility, and pain.   ACTIVITY LIMITATIONS bending, standing, squatting, stairs, and locomotion level  PARTICIPATION LIMITATIONS: community activity and sports  PERSONAL FACTORS 1-2 comorbidities: history of post-concussion syndrome and vertigo, IBS, migraines  are also affecting patient's functional outcome.   REHAB POTENTIAL: Good  CLINICAL DECISION MAKING: Stable/uncomplicated  EVALUATION COMPLEXITY: Low   GOALS: Goals reviewed with patient? Yes  SHORT TERM GOALS: Target date: 01/07/2022   Patient will be independent with initial HEP. Baseline: no HEP Goal status: MET -01/19/22 good compliance.     LONG TERM GOALS: Target date: 02/04/2022   Patient will be independent with advanced/ongoing HEP to improve outcomes and carryover.  Baseline:  Goal status: IN PROGRESS  2.  Patient will report at least 75% improvement in R knee pain with activity. Baseline: up to 7/10 pain with activity Goal status: IN PROGRESS  3.  Patient will demonstrate 5/5 hip strength to support knee and improve kinematics.  Baseline: see objective Goal status: IN PROGRESS  4. Patient will be able to ascend/descend stairs with 1 HR and reciprocal step pattern  without increased knee pain to access.   Baseline: pain descending Goal status: IN PROGRESS  5.  Patient will report >56/80 on LEFS to demonstrate improved functional ability. Baseline: 47/80 Goal status: IN PROGRESS  6. Patient will demonstrate R hip and ankle AROM = LLE AROM.   Baseline: tightness R hip IR, R ankle DF Goal status: IN PROGRESS   7.  Patient will be able to run x 50' without increased R knee/foot pain.  Baseline: unable Goal status: IN PROGRESS    PLAN: PT FREQUENCY: 1-2x/week  PT DURATION: 6 weeks  PLANNED INTERVENTIONS: Therapeutic exercises, Therapeutic activity, Neuromuscular  re-education, Balance training, Gait training, Patient/Family education, Self Care, Joint mobilization, Stair training, Orthotic/Fit training, Dry Needling, Electrical stimulation, Cryotherapy, Moist heat, scar mobilization, Taping, Vasopneumatic device, Ultrasound, Ionotophoresis 76m/ml Dexamethasone, Manual therapy, and Re-evaluation  PLAN FOR NEXT SESSION: LE strengthening, focusing on hip strength, R ankle ROM, manual therapy and modalities PRN.    ERennie Natter PT, DPT 01/22/2022, 5:58 PM

## 2022-01-23 ENCOUNTER — Other Ambulatory Visit (HOSPITAL_BASED_OUTPATIENT_CLINIC_OR_DEPARTMENT_OTHER): Payer: Self-pay

## 2022-01-23 MED ORDER — FLUARIX QUADRIVALENT 0.5 ML IM SUSY
PREFILLED_SYRINGE | INTRAMUSCULAR | 0 refills | Status: DC
  Filled 2022-01-23: qty 0.5, 1d supply, fill #0

## 2022-01-27 ENCOUNTER — Ambulatory Visit: Payer: 59 | Attending: Family Medicine

## 2022-01-27 DIAGNOSIS — M25561 Pain in right knee: Secondary | ICD-10-CM | POA: Diagnosis present

## 2022-01-27 DIAGNOSIS — M79671 Pain in right foot: Secondary | ICD-10-CM | POA: Insufficient documentation

## 2022-01-27 DIAGNOSIS — M6281 Muscle weakness (generalized): Secondary | ICD-10-CM | POA: Diagnosis present

## 2022-01-27 NOTE — Therapy (Signed)
OUTPATIENT PHYSICAL THERAPY TREATMENT   Patient Name: [name] MRN: [id number] DOB:06/15/1965, 56 y.o., female Today's Date: 01/27/2022   PT End of Session - 01/27/22 1702     Visit Number 8    Number of Visits 12    Date for PT Re-Evaluation 02/04/22    Authorization Type U[employer] [id number] used 7 previously    PT Start Time 1620    PT Stop Time 1700    PT Time Calculation (min) 40 min    Activity Tolerance Patient tolerated treatment well    Behavior During Therapy WFL for tasks assessed/performed                Past Medical History:  Diagnosis Date   Allergic rhinitis    Anemia    Anxiety    Asthma    Contact dermatitis and eczema    Diverticulitis    CT Scan   Esophageal reflux    H. pylori infection    Hyperlipidemia, mixed    IBS (irritable bowel syndrome)    IC (interstitial cystitis)    Internal hemorrhoids    Lumbago    Mitral valve disorders(424.0)    Osteoarthritis 07/08/2015   Post concussive syndrome    Seasonal and perennial allergic rhinitis 01/20/2015   Past Surgical History:  Procedure Laterality Date   BARTHOLIN CYST MARSUPIALIZATION N/A 12/18/2013   Procedure: BARTHOLIN CYST MARSUPIALIZATION WITH BIOPSY;  Surgeon: Lovenia Kim, MD;  Location: [facility];  Service: Gynecology;  Laterality: N/A;   CESAREAN SECTION     DILITATION & CURRETTAGE/HYSTROSCOPY WITH NOVASURE ABLATION N/A 10/14/2012   Procedure: DILATATION & CURETTAGE/HYSTEROSCOPY WITH NOVASURE ABLATION;  Surgeon: Lovenia Kim, MD;  Location: [facility];  Service: Gynecology;  Laterality: N/A;   MOUTH SURGERY     SINUS SURGERY WITH INSTATRAK     Patient Active Problem List   Diagnosis Date Noted   Closed fracture of phalanx of right great toe 11/19/2021   Raynaud disease 08/26/2021   renal insufficiency 01/07/2021   Varicose veins of both lower extremities 10/28/2020   Hemorrhoids 10/28/2020   Hyperglycemia 04/01/2020   Nonintractable headache 12/31/2019   Right leg pain  11/19/2019   Constipation 11/17/2019   Neck pain 09/25/2019   Educated about COVID-19 virus infection 06/21/2019   Insomnia 02/19/2019   Anxiety and depression 01/13/2019   Perimenopausal 45/80/9983   History of Helicobacter pylori infection 12/28/2017   Nocturia 07/19/2016   Rash and nonspecific skin eruption 03/21/2016   Post concussive syndrome 01/22/2016   Osteoarthritis 07/08/2015   Hematuria 01/20/2015   Hyperlipidemia, mild 01/20/2015   Seasonal and perennial allergic rhinitis 01/20/2015   Fatigue 10/24/2014   Arthralgia 08/20/2013   Diverticulosis 07/01/2011   Menopausal state 03/25/2011   Fibroids 10/24/2010   Anemia 03/25/2010   IRRITABLE BOWEL SYNDROME 03/25/2010   Mitral valve disorder 07/09/2007   Allergic asthma, mild intermittent, uncomplicated 38/25/0539   ESOPHAGEAL REFLUX 07/09/2007   LOW BACK PAIN 12/14/2006    PCP: Mosie Lukes., MD  REFERRING PROVIDER: Mosie Lukes., MD  REFERRING DIAG: [phone] (ICD-10-CM) - Arthralgia of right foot M79.604 (ICD-10-CM) - Right leg pain  THERAPY DIAG:  Acute pain of right knee  Pain in right foot  Muscle weakness (generalized)  Rationale for Evaluation and Treatment Rehabilitation  ONSET DATE: 09/25/2021  SUBJECTIVE:   SUBJECTIVE STATEMENT: Pt recently got [employer] shoes, she is pleased so far.   PERTINENT HISTORY: post concussion syndrome, R great toe fracture, history migraines,  PAIN:  Are you having pain? No - discomfort in R big toe  PRECAUTIONS: None  WEIGHT BEARING RESTRICTIONS No  FALLS:  Has patient fallen in last 6 months? Yes. Number of falls 1, slipped turning around.   LIVING ENVIRONMENT: Lives with: lives with their family Lives in: House/apartment Stairs: Yes: Internal: 12 steps; on right going up Has following equipment at home: None  OCCUPATION: social services/medicaid eligibility specialist  PLOF: Independent used to do Pilates and run daily  PATIENT GOALS build  up strength in right leg, be able to run again.    OBJECTIVE:   DIAGNOSTIC FINDINGS:  09/26/2021 Right great toe Xray IMPRESSION: Acute nondisplaced intra-articular fracture involving the base of the first distal phalanx.  12/16/2021 Right knee Xray: FINDINGS: No evidence of fracture, dislocation, or joint effusion. No evidence of arthropathy or other focal bone abnormality. Soft tissues are unremarkable.    PATIENT SURVEYS:  LEFS 47/80  COGNITION:  Overall cognitive status: Within functional limits for tasks assessed     SENSATION: WFL  EDEMA:  NA  MUSCLE LENGTH: Hamstrings: Right 90 deg; Left 90 deg  POSTURE: rounded shoulders and forward head  PALPATION: Tenderness R LCL, R vastus lateralis  LOWER EXTREMITY MMT:  MMT Right eval Left eval  Hip flexion 4+ 4+  Hip extension 4 4  Hip abduction 4+ 4+  Hip adduction 4+ 4+   Knee flexion 4+ 5  Knee extension 5 5  Ankle dorsiflexion 5 5  Ankle plantarflexion 4+* 5  Ankle inversion    Ankle eversion     (Blank rows = not tested)  LOWER EXTREMITY ROM:  AROM Right eval Left eval Right 01/27/22  Hip flexion     Hip extension     Hip abduction     Hip adduction     Hip internal rotation _0 Hip external rotation     Knee flexion     Knee extension     Ankle dorsiflexion 0 20 4  Ankle plantarflexion     Ankle inversion     Ankle eversion      (Blank rows = not tested)  LOWER EXTREMITY SPECIAL TESTS:  Knee special tests: Anterior drawer test: negative, Posterior drawer test: negative, and McMurray's test: negative  FUNCTIONAL TESTS:  5 times sit to stand: 11 seconds  GAIT: Distance walked: 70' Assistive device utilized: None Level of assistance: Complete Independence Comments: no significant deviation or device    TODAY'S TREATMENT: 01/27/22 Therapeutic Exercise: Treadmill 2.5 mph x 6 min On trampouline:  Squats on trampouline x 10 Tandem stance 3 way LE reach flex, abd, ext bil 5x  each  Standing shoulder extension 10# x 10 in tandem stance Standing rows 10# 2x10 Fwd step ups 6' step x 10 bil  01/22/2022 Therapeutic Exercise: to improve strength and mobility.  Demo, verbal and tactile cues throughout for technique. Treadmill 2 mph x 8 min  Hip 90/90s 2 x 10  Hip extension from 90/90s x 5 bil  Hamstring curls on ball 2 x 10  Self STM with foam roller to low back - full extension x 10 Self STM with foam roller to hamstrings and calves x 10  Manual Therapy: to decrease muscle spasm and pain and improve mobility IASTM with s/s tools to bil hamstrings, calves, R quad.   01/19/2022 Therapeutic Exercise: to improve strength and mobility.  Demo, verbal and tactile cues throughout for technique. Elliptical x 5 min  Lunges with foot on slider - lateral x  10 bil, forward x 10 bil, backwards x 10 bil   Star excursion pattern 360 deg x 3 each    Toe raises on wall 2 x 10    Wall squats 5 x 15 sec  Neuromuscular Reeducation: to improve balance and stability. SBA for safety throughout.  On airex in corner: Ankle sways x 20 [employer] x 20 Tandem stance 2 x 15 sec bil  SLS 2 x 10 sec bil  Tandem walking on airex balance beam   PATIENT EDUCATION:  Education details: progressed HEP 9/21, 01/19/22.  Info on purchasing balance pad.  Person educated: Patient Education method: Explanation, Demonstration, Verbal cues, and Handouts Education comprehension: verbalized understanding and returned demonstration   HOME EXERCISE PROGRAM: Access Code: [email or web address]  ASSESSMENT:  CLINICAL IMPRESSION:  Good response to the progression of exercises. Continued work on proximal stability. Progressed balance exercises to trampouline and instructed to progress HEP with trampouline if other method becomes too easy. Ankle and hip ROM has improved today. Stair navigation looked good today with no apparent deviations or difficulty so LTG #4 is met.    OBJECTIVE IMPAIRMENTS decreased activity  tolerance, decreased endurance, decreased ROM, decreased strength, increased fascial restrictions, increased muscle spasms, impaired flexibility, and pain.   ACTIVITY LIMITATIONS bending, standing, squatting, stairs, and locomotion level  PARTICIPATION LIMITATIONS: community activity and sports  PERSONAL FACTORS 1-2 comorbidities: history of post-concussion syndrome and vertigo, IBS, migraines  are also affecting patient's functional outcome.   REHAB POTENTIAL: Good  CLINICAL DECISION MAKING: Stable/uncomplicated  EVALUATION COMPLEXITY: Low   GOALS: Goals reviewed with patient? Yes  SHORT TERM GOALS: Target date: 01/07/2022   Patient will be independent with initial HEP. Baseline: no HEP Goal status: MET -01/19/22 good compliance.     LONG TERM GOALS: Target date: 02/04/2022   Patient will be independent with advanced/ongoing HEP to improve outcomes and carryover.  Baseline:  Goal status: IN PROGRESS  2.  Patient will report at least 75% improvement in R knee pain with activity. Baseline: up to 7/10 pain with activity Goal status: IN PROGRESS  3.  Patient will demonstrate 5/5 hip strength to support knee and improve kinematics.  Baseline: see objective Goal status: IN PROGRESS  4. Patient will be able to ascend/descend stairs with 1 HR and reciprocal step pattern  without increased knee pain to access.  Baseline: pain descending Goal status: MET - 01/27/22  5.  Patient will report >56/80 on LEFS to demonstrate improved functional ability. Baseline: 47/80 Goal status: IN PROGRESS  6. Patient will demonstrate R hip and ankle AROM = LLE AROM.   Baseline: tightness R hip IR, R ankle DF Goal status: IN PROGRESS - 01/27/22 improved both hip and ankle  7.  Patient will be able to run x 50' without increased R knee/foot pain.  Baseline: unable Goal status: IN PROGRESS    PLAN: PT FREQUENCY: 1-2x/week  PT DURATION: 6 weeks  PLANNED INTERVENTIONS: Therapeutic  exercises, Therapeutic activity, Neuromuscular re-education, Balance training, Gait training, Patient/Family education, Self Care, Joint mobilization, Stair training, Orthotic/Fit training, Dry Needling, Electrical stimulation, Cryotherapy, Moist heat, scar mobilization, Taping, Vasopneumatic device, Ultrasound, Ionotophoresis 30m/ml Dexamethasone, Manual therapy, and Re-evaluation  PLAN FOR NEXT SESSION: LE strengthening, focusing on hip strength, R ankle ROM, manual therapy and modalities PRN.    BArtist Pais PTA 01/27/2022, 5:03 PM

## 2022-01-28 ENCOUNTER — Ambulatory Visit: Payer: 59

## 2022-01-28 DIAGNOSIS — M79671 Pain in right foot: Secondary | ICD-10-CM

## 2022-01-28 DIAGNOSIS — M6281 Muscle weakness (generalized): Secondary | ICD-10-CM

## 2022-01-28 DIAGNOSIS — M25561 Pain in right knee: Secondary | ICD-10-CM

## 2022-01-28 NOTE — Therapy (Signed)
OUTPATIENT PHYSICAL THERAPY TREATMENT   Patient Name: [name] MRN: [id number] DOB:[birth year], 56 y.o., female Today's Date: 01/28/2022   PT End of Session - 01/28/22 1708     Visit Number 9    Number of Visits 12    Date for PT Re-Evaluation 02/04/22    Authorization Type U[employer] [id number] used 7 previously    PT Start Time 7829    PT Stop Time 1700    PT Time Calculation (min) 43 min    Activity Tolerance Patient tolerated treatment well    Behavior During Therapy WFL for tasks assessed/performed                 Past Medical History:  Diagnosis Date   Allergic rhinitis    Anemia    Anxiety    Asthma    Contact dermatitis and eczema    Diverticulitis    CT Scan   Esophageal reflux    H. pylori infection    Hyperlipidemia, mixed    IBS (irritable bowel syndrome)    IC (interstitial cystitis)    Internal hemorrhoids    Lumbago    Mitral valve disorders(424.0)    Osteoarthritis 07/08/2015   Post concussive syndrome    Seasonal and perennial allergic rhinitis 01/20/2015   Past Surgical History:  Procedure Laterality Date   BARTHOLIN CYST MARSUPIALIZATION N/A 12/18/2013   Procedure: BARTHOLIN CYST MARSUPIALIZATION WITH BIOPSY;  Surgeon: Lovenia Kim, MD;  Location: [facility];  Service: Gynecology;  Laterality: N/A;   CESAREAN SECTION     DILITATION & CURRETTAGE/HYSTROSCOPY WITH NOVASURE ABLATION N/A 10/14/2012   Procedure: DILATATION & CURETTAGE/HYSTEROSCOPY WITH NOVASURE ABLATION;  Surgeon: Lovenia Kim, MD;  Location: [facility];  Service: Gynecology;  Laterality: N/A;   MOUTH SURGERY     SINUS SURGERY WITH INSTATRAK     Patient Active Problem List   Diagnosis Date Noted   Closed fracture of phalanx of right great toe 11/19/2021   Raynaud disease 08/26/2021   renal insufficiency 01/07/2021   Varicose veins of both lower extremities 10/28/2020   Hemorrhoids 10/28/2020   Hyperglycemia 04/01/2020   Nonintractable headache 12/31/2019   Right leg pain  11/19/2019   Constipation 11/17/2019   Neck pain 09/25/2019   Educated about COVID-19 virus infection 06/21/2019   Insomnia 02/19/2019   Anxiety and depression 01/13/2019   Perimenopausal 56/21/3086   History of Helicobacter pylori infection 12/28/2017   Nocturia 07/19/2016   Rash and nonspecific skin eruption 03/21/2016   Post concussive syndrome 01/22/2016   Osteoarthritis 07/08/2015   Hematuria 01/20/2015   Hyperlipidemia, mild 01/20/2015   Seasonal and perennial allergic rhinitis 01/20/2015   Fatigue 10/24/2014   Arthralgia 08/20/2013   Diverticulosis 07/01/2011   Menopausal state 03/25/2011   Fibroids 10/24/2010   Anemia 03/25/2010   IRRITABLE BOWEL SYNDROME 03/25/2010   Mitral valve disorder 07/09/2007   Allergic asthma, mild intermittent, uncomplicated 57/84/6962   ESOPHAGEAL REFLUX 07/09/2007   LOW BACK PAIN 12/14/2006    PCP: Mosie Lukes., MD  REFERRING PROVIDER: Mosie Lukes., MD  REFERRING DIAG: [phone] (ICD-10-CM) - Arthralgia of right foot M79.604 (ICD-10-CM) - Right leg pain  THERAPY DIAG:  Acute pain of right knee  Pain in right foot  Muscle weakness (generalized)  Rationale for Evaluation and Treatment Rehabilitation  ONSET DATE: 09/25/2021  SUBJECTIVE:   SUBJECTIVE STATEMENT: Doing good, was just tired yesterday after session.  PERTINENT HISTORY: post concussion syndrome, R great toe fracture, history migraines,  PAIN:  Are you having  pain? No - discomfort in R big toe  PRECAUTIONS: None  WEIGHT BEARING RESTRICTIONS No  FALLS:  Has patient fallen in last 6 months? Yes. Number of falls 1, slipped turning around.   LIVING ENVIRONMENT: Lives with: lives with their family Lives in: House/apartment Stairs: Yes: Internal: 12 steps; on right going up Has following equipment at home: None  OCCUPATION: social services/medicaid eligibility specialist  PLOF: Independent used to do Pilates and run daily  PATIENT GOALS build up  strength in right leg, be able to run again.    OBJECTIVE:   DIAGNOSTIC FINDINGS:  09/26/2021 Right great toe Xray IMPRESSION: Acute nondisplaced intra-articular fracture involving the base of the first distal phalanx.  12/16/2021 Right knee Xray: FINDINGS: No evidence of fracture, dislocation, or joint effusion. No evidence of arthropathy or other focal bone abnormality. Soft tissues are unremarkable.    PATIENT SURVEYS:  LEFS 47/80  COGNITION:  Overall cognitive status: Within functional limits for tasks assessed     SENSATION: WFL  EDEMA:  NA  MUSCLE LENGTH: Hamstrings: Right 90 deg; Left 90 deg  POSTURE: rounded shoulders and forward head  PALPATION: Tenderness R LCL, R vastus lateralis  LOWER EXTREMITY MMT:  MMT Right eval Left eval  Hip flexion 4+ 4+  Hip extension 4 4  Hip abduction 4+ 4+  Hip adduction 4+ 4+   Knee flexion 4+ 5  Knee extension 5 5  Ankle dorsiflexion 5 5  Ankle plantarflexion 4+* 5  Ankle inversion    Ankle eversion     (Blank rows = not tested)  LOWER EXTREMITY ROM:  AROM Right eval Left eval Right 01/27/22  Hip flexion     Hip extension     Hip abduction     Hip adduction     Hip internal rotation _0 Hip external rotation     Knee flexion     Knee extension     Ankle dorsiflexion 0 20 4  Ankle plantarflexion     Ankle inversion     Ankle eversion      (Blank rows = not tested)  LOWER EXTREMITY SPECIAL TESTS:  Knee special tests: Anterior drawer test: negative, Posterior drawer test: negative, and McMurray's test: negative  FUNCTIONAL TESTS:  5 times sit to stand: 11 seconds  GAIT: Distance walked: 66' Assistive device utilized: None Level of assistance: Complete Independence Comments: no significant deviation or device    TODAY'S TREATMENT: 01/28/22 TherEx: Treadmill 2 mph x 8 min Knee extension 10# BLE x 10; R/L 5# x 10 Knee flexion 10# BLE x 10; R/L 5# x 10 Gastroc stretch on step 3x30 sec  LLE Manual Therapy: STM to R gastroc muscle bellies,  distal hamstrings, TP found throughout medial and lateral gastroc muscle bellies  01/27/22 Therapeutic Exercise: Treadmill 2.5 mph x 6 min On trampouline:  Squats on trampouline x 10 Tandem stance 3 way LE reach flex, abd, ext bil 5x each  Standing shoulder extension 10# x 10 in tandem stance Standing rows 10# 2x10 Fwd step ups 6' step x 10 bil  01/22/2022 Therapeutic Exercise: to improve strength and mobility.  Demo, verbal and tactile cues throughout for technique. Treadmill 2 mph x 8 min  Hip 90/90s 2 x 10  Hip extension from 90/90s x 5 bil  Hamstring curls on ball 2 x 10  Self STM with foam roller to low back - full extension x 10 Self STM with foam roller to hamstrings and calves x 10  Manual Therapy: to decrease muscle spasm and pain and improve mobility IASTM with s/s tools to bil hamstrings, calves, R quad.      PATIENT EDUCATION:  Education details: progressed HEP 9/21, 01/19/22.  Info on purchasing balance pad.  Person educated: Patient Education method: Explanation, Demonstration, Verbal cues, and Handouts Education comprehension: verbalized understanding and returned demonstration   HOME EXERCISE PROGRAM: Access Code: [email or web address]  ASSESSMENT:  CLINICAL IMPRESSION:  Good response to treatment. Started session with knee ext/flexion machine, she reported the posterior knee feeling very tight. Shifted focus to MT, which we found TP throughout the gastroc muscle bellies and lateral hamstrings, folowed by gastroc stretching. Post manual she reported that the HS curls felt better. also educated on using heat, self mobilization, and stretching to relieve tension.   OBJECTIVE IMPAIRMENTS decreased activity tolerance, decreased endurance, decreased ROM, decreased strength, increased fascial restrictions, increased muscle spasms, impaired flexibility, and pain.   ACTIVITY LIMITATIONS bending, standing, squatting, stairs,  and locomotion level  PARTICIPATION LIMITATIONS: community activity and sports  PERSONAL FACTORS 1-2 comorbidities: history of post-concussion syndrome and vertigo, IBS, migraines  are also affecting patient's functional outcome.   REHAB POTENTIAL: Good  CLINICAL DECISION MAKING: Stable/uncomplicated  EVALUATION COMPLEXITY: Low   GOALS: Goals reviewed with patient? Yes  SHORT TERM GOALS: Target date: 01/07/2022   Patient will be independent with initial HEP. Baseline: no HEP Goal status: MET -01/19/22 good compliance.     LONG TERM GOALS: Target date: 02/04/2022   Patient will be independent with advanced/ongoing HEP to improve outcomes and carryover.  Baseline:  Goal status: IN PROGRESS  2.  Patient will report at least 75% improvement in R knee pain with activity. Baseline: up to 7/10 pain with activity Goal status: IN PROGRESS  3.  Patient will demonstrate 5/5 hip strength to support knee and improve kinematics.  Baseline: see objective Goal status: IN PROGRESS  4. Patient will be able to ascend/descend stairs with 1 HR and reciprocal step pattern  without increased knee pain to access.  Baseline: pain descending Goal status: MET - 01/27/22  5.  Patient will report >56/80 on LEFS to demonstrate improved functional ability. Baseline: 47/80 Goal status: IN PROGRESS  6. Patient will demonstrate R hip and ankle AROM = LLE AROM.   Baseline: tightness R hip IR, R ankle DF Goal status: IN PROGRESS - 01/27/22 improved both hip and ankle  7.  Patient will be able to run x 50' without increased R knee/foot pain.  Baseline: unable Goal status: IN PROGRESS    PLAN: PT FREQUENCY: 1-2x/week  PT DURATION: 6 weeks  PLANNED INTERVENTIONS: Therapeutic exercises, Therapeutic activity, Neuromuscular re-education, Balance training, Gait training, Patient/Family education, Self Care, Joint mobilization, Stair training, Orthotic/Fit training, Dry Needling, Electrical stimulation,  Cryotherapy, Moist heat, scar mobilization, Taping, Vasopneumatic device, Ultrasound, Ionotophoresis 39m/ml Dexamethasone, Manual therapy, and Re-evaluation  PLAN FOR NEXT SESSION: LE strengthening, focusing on hip strength, R ankle ROM, manual therapy and modalities PRN.    BArtist Pais PTA 01/28/2022, 5:09 PM

## 2022-02-03 ENCOUNTER — Ambulatory Visit: Payer: 59 | Admitting: Physical Therapy

## 2022-02-03 ENCOUNTER — Encounter: Payer: Self-pay | Admitting: Physical Therapy

## 2022-02-03 DIAGNOSIS — M79671 Pain in right foot: Secondary | ICD-10-CM

## 2022-02-03 DIAGNOSIS — M25561 Pain in right knee: Secondary | ICD-10-CM

## 2022-02-03 DIAGNOSIS — M6281 Muscle weakness (generalized): Secondary | ICD-10-CM

## 2022-02-03 NOTE — Therapy (Signed)
OUTPATIENT PHYSICAL THERAPY TREATMENT   Patient Name: [name] MRN: [id number] DOB:Nov 27, 1965, 56 y.o., female Today's Date: 02/03/2022   PT End of Session - 02/03/22 1708     Visit Number 10    Number of Visits 12    Date for PT Re-Evaluation 02/04/22    Authorization Type U[employer] [id number] used 7 previously    PT Start Time 1708    PT Stop Time 9532    PT Time Calculation (min) 44 min    Activity Tolerance Patient tolerated treatment well    Behavior During Therapy WFL for tasks assessed/performed                 Past Medical History:  Diagnosis Date   Allergic rhinitis    Anemia    Anxiety    Asthma    Contact dermatitis and eczema    Diverticulitis    CT Scan   Esophageal reflux    H. pylori infection    Hyperlipidemia, mixed    IBS (irritable bowel syndrome)    IC (interstitial cystitis)    Internal hemorrhoids    Lumbago    Mitral valve disorders(424.0)    Osteoarthritis 07/08/2015   Post concussive syndrome    Seasonal and perennial allergic rhinitis 01/20/2015   Past Surgical History:  Procedure Laterality Date   BARTHOLIN CYST MARSUPIALIZATION N/A 12/18/2013   Procedure: BARTHOLIN CYST MARSUPIALIZATION WITH BIOPSY;  Surgeon: Lovenia Kim, MD;  Location: [facility];  Service: Gynecology;  Laterality: N/A;   CESAREAN SECTION     DILITATION & CURRETTAGE/HYSTROSCOPY WITH NOVASURE ABLATION N/A 10/14/2012   Procedure: DILATATION & CURETTAGE/HYSTEROSCOPY WITH NOVASURE ABLATION;  Surgeon: Lovenia Kim, MD;  Location: [facility];  Service: Gynecology;  Laterality: N/A;   MOUTH SURGERY     SINUS SURGERY WITH INSTATRAK     Patient Active Problem List   Diagnosis Date Noted   Closed fracture of phalanx of right great toe 11/19/2021   Raynaud disease 08/26/2021   renal insufficiency 01/07/2021   Varicose veins of both lower extremities 10/28/2020   Hemorrhoids 10/28/2020   Hyperglycemia 04/01/2020   Nonintractable headache 12/31/2019   Right leg pain  11/19/2019   Constipation 11/17/2019   Neck pain 09/25/2019   Educated about COVID-19 virus infection 06/21/2019   Insomnia 02/19/2019   Anxiety and depression 01/13/2019   Perimenopausal 02/33/4356   History of Helicobacter pylori infection 12/28/2017   Nocturia 07/19/2016   Rash and nonspecific skin eruption 03/21/2016   Post concussive syndrome 01/22/2016   Osteoarthritis 07/08/2015   Hematuria 01/20/2015   Hyperlipidemia, mild 01/20/2015   Seasonal and perennial allergic rhinitis 01/20/2015   Fatigue 10/24/2014   Arthralgia 08/20/2013   Diverticulosis 07/01/2011   Menopausal state 03/25/2011   Fibroids 10/24/2010   Anemia 03/25/2010   IRRITABLE BOWEL SYNDROME 03/25/2010   Mitral valve disorder 07/09/2007   Allergic asthma, mild intermittent, uncomplicated 86/16/8372   ESOPHAGEAL REFLUX 07/09/2007   LOW BACK PAIN 12/14/2006    PCP: Mosie Lukes., MD  REFERRING PROVIDER: Mosie Lukes., MD  REFERRING DIAG: [phone] (ICD-10-CM) - Arthralgia of right foot M79.604 (ICD-10-CM) - Right leg pain  THERAPY DIAG:  Acute pain of right knee  Pain in right foot  Muscle weakness (generalized)  Rationale for Evaluation and Treatment Rehabilitation  ONSET DATE: 09/25/2021  SUBJECTIVE:   SUBJECTIVE STATEMENT: Doing good, just tired today.  R toe throbs with increased activity.  Started running, felt like something and shifted back into place in knee.  PERTINENT HISTORY: post concussion syndrome, R great toe fracture, history migraines,  PAIN:  Are you having pain? No   PRECAUTIONS: None  WEIGHT BEARING RESTRICTIONS No  FALLS:  Has patient fallen in last 6 months? Yes. Number of falls 1, slipped turning around.   LIVING ENVIRONMENT: Lives with: lives with their family Lives in: House/apartment Stairs: Yes: Internal: 12 steps; on right going up Has following equipment at home: None  OCCUPATION: social services/medicaid eligibility specialist  PLOF:  Independent used to do Pilates and run daily  PATIENT GOALS build up strength in right leg, be able to run again.    OBJECTIVE:   DIAGNOSTIC FINDINGS:  09/26/2021 Right great toe Xray IMPRESSION: Acute nondisplaced intra-articular fracture involving the base of the first distal phalanx.  12/16/2021 Right knee Xray: FINDINGS: No evidence of fracture, dislocation, or joint effusion. No evidence of arthropathy or other focal bone abnormality. Soft tissues are unremarkable.    PATIENT SURVEYS:  LEFS 47/80  COGNITION:  Overall cognitive status: Within functional limits for tasks assessed     SENSATION: WFL  EDEMA:  NA  MUSCLE LENGTH: Hamstrings: Right 90 deg; Left 90 deg  POSTURE: rounded shoulders and forward head  PALPATION: Tenderness R LCL, R vastus lateralis  LOWER EXTREMITY MMT:  MMT Right eval Left eval Right 02/03/22 Left 02/03/22  Hip flexion 4+ 4+ 5 5  Hip extension 4 4 4+ 5  Hip abduction 4+ 4+ 5 5  Hip adduction 4+ 4+  5 5  Knee flexion 4+ '5 5 5  ' Knee extension 5 5    Ankle dorsiflexion 5 5    Ankle plantarflexion 4+* '5 5 5  ' Ankle inversion      Ankle eversion       (Blank rows = not tested)  LOWER EXTREMITY ROM:  AROM Right eval Left eval Right 01/27/22 Right 02/03/22  Hip flexion      Hip extension      Hip abduction      Hip adduction      Hip internal rotation '10 20 28   ' Hip external rotation      Knee flexion      Knee extension      Ankle dorsiflexion 0 '20 4 15  ' Ankle plantarflexion      Ankle inversion      Ankle eversion       (Blank rows = not tested)  LOWER EXTREMITY SPECIAL TESTS:  Knee special tests: Anterior drawer test: negative, Posterior drawer test: negative, and McMurray's test: negative  FUNCTIONAL TESTS:  5 times sit to stand: 11 seconds  GAIT: Distance walked: 106' Assistive device utilized: None Level of assistance: Complete Independence Comments: no significant deviation or device    TODAY'S  TREATMENT: 01/30/2022 Therapeutic Exercise: to improve strength and mobility.  Demo, verbal and tactile cues throughout for technique. Treadmill  2.0 mph x 7 min  Single leg RDLs x 10 bil  Glut bridges x 10 Therapeutic Activity:  to assess progress towards goals.   Stairs, ROM Manual Therapy: to decrease muscle spasm and improve mobility STM to R lateral quads and gastroc, demo self STM with stick roller, mobs to R toes, 1st ray.    01/28/22 TherEx: Treadmill 2 mph x 8 min Knee extension 10# BLE x 10; R/L 5# x 10 Knee flexion 10# BLE x 10; R/L 5# x 10 Gastroc stretch on step 3x30 sec LLE Manual Therapy: STM to R gastroc muscle bellies,  distal hamstrings, TP found throughout medial  and lateral gastroc muscle bellies  01/27/22 Therapeutic Exercise: Treadmill 2.5 mph x 6 min On trampouline:  Squats on trampouline x 10 Tandem stance 3 way LE reach flex, abd, ext bil 5x each  Standing shoulder extension 10# x 10 in tandem stance Standing rows 10# 2x10 Fwd step ups 6' step x 10 bil  01/22/2022 Therapeutic Exercise: to improve strength and mobility.  Demo, verbal and tactile cues throughout for technique. Treadmill 2 mph x 8 min  Hip 90/90s 2 x 10  Hip extension from 90/90s x 5 bil  Hamstring curls on ball 2 x 10  Self STM with foam roller to low back - full extension x 10 Self STM with foam roller to hamstrings and calves x 10  Manual Therapy: to decrease muscle spasm and pain and improve mobility IASTM with s/s tools to bil hamstrings, calves, R quad.      PATIENT EDUCATION:  Education details: progressed HEP 9/21, 9/25, 10/10.  Person educated: Patient Education method: Explanation, Demonstration, Verbal cues, and Handouts Education comprehension: verbalized understanding and returned demonstration   HOME EXERCISE PROGRAM: Access Code: [email or web address]  ASSESSMENT:  CLINICAL IMPRESSION:  [name] is making excellent progress and has met or close to meeting all goals.   She has returned to running, still has some R toe pain following.  She also still demonstrates weakness in R glutes, so progressed exercises to address.  Performed mobs to R fore foot, reported decreased tightness and pain following.  Plan to discharge after next session as patient is participating now in pilates, yoga and running which will continue progress.     OBJECTIVE IMPAIRMENTS decreased activity tolerance, decreased endurance, decreased ROM, decreased strength, increased fascial restrictions, increased muscle spasms, impaired flexibility, and pain.   ACTIVITY LIMITATIONS bending, standing, squatting, stairs, and locomotion level  PARTICIPATION LIMITATIONS: community activity and sports  PERSONAL FACTORS 1-2 comorbidities: history of post-concussion syndrome and vertigo, IBS, migraines  are also affecting patient's functional outcome.   REHAB POTENTIAL: Good  CLINICAL DECISION MAKING: Stable/uncomplicated  EVALUATION COMPLEXITY: Low   GOALS: Goals reviewed with patient? Yes  SHORT TERM GOALS: Target date: 01/07/2022   Patient will be independent with initial HEP. Baseline: no HEP Goal status: MET -01/19/22 good compliance.     LONG TERM GOALS: Target date: 02/04/2022   Patient will be independent with advanced/ongoing HEP to improve outcomes and carryover.  Baseline:  Goal status: MET  2.  Patient will report at least 75% improvement in R knee pain with activity. Baseline: up to 7/10 pain with activity Goal status: MET 02/03/22 - no knee pain now with activity, even running  3.  Patient will demonstrate 5/5 hip strength to support knee and improve kinematics.  Baseline: see objective Goal status: IN PROGRESS 02/03/22 - almost met except 4+/5 R hip extension.  4. Patient will be able to ascend/descend stairs with 1 HR and reciprocal step pattern  without increased knee pain to access.  Baseline: pain descending Goal status: MET - 01/27/22  ascends/descends without HR,  reciprocal step, no pain.   5.  Patient will report >56/80 on LEFS to demonstrate improved functional ability. Baseline: 47/80 Goal status: IN PROGRESS  6. Patient will demonstrate R hip and ankle AROM = LLE AROM.   Baseline: tightness R hip IR, R ankle DF Goal status: IN PROGRESS - 02/03/22- almost met, slightly tighter in R calf  7.  Patient will be able to run x 50' without increased R knee/foot pain.  Baseline: unable  Goal status: MET  02/03/22- has started running again without knee pain.      PLAN: PT FREQUENCY: 1-2x/week  PT DURATION: 6 weeks  PLANNED INTERVENTIONS: Therapeutic exercises, Therapeutic activity, Neuromuscular re-education, Balance training, Gait training, Patient/Family education, Self Care, Joint mobilization, Stair training, Orthotic/Fit training, Dry Needling, Electrical stimulation, Cryotherapy, Moist heat, scar mobilization, Taping, Vasopneumatic device, Ultrasound, Ionotophoresis 38m/ml Dexamethasone, Manual therapy, and Re-evaluation  PLAN FOR NEXT SESSION: LEFS for goal #5, review HEP, discharge to [facility] PT, DPT  02/03/2022, 6:06 PM

## 2022-02-04 ENCOUNTER — Ambulatory Visit: Payer: 59

## 2022-02-04 DIAGNOSIS — M25561 Pain in right knee: Secondary | ICD-10-CM | POA: Diagnosis not present

## 2022-02-04 DIAGNOSIS — M6281 Muscle weakness (generalized): Secondary | ICD-10-CM

## 2022-02-04 DIAGNOSIS — M79671 Pain in right foot: Secondary | ICD-10-CM

## 2022-02-04 NOTE — Therapy (Addendum)
OUTPATIENT PHYSICAL THERAPY TREATMENT PHYSICAL THERAPY DISCHARGE SUMMARY  Visits from Start of Care: 11  Current functional level related to goals / functional outcomes: Decreased R knee/ankle/foot pain, all goals met or almost met, has returned to running, 73/80 on LEFS   Remaining deficits: Slight tightness R ankle, 4+/5 R hip extensor strength   Education / Equipment: HEP  Plan: Patient agrees to discharge.  Patient is being discharged due to meeting the stated rehab goals.   She was placed on 30 day hold on 02/04/2022 and has not needed to return to skilled therapy.    Rennie Natter, PT, DPT 9:35 AM 03/06/2022    Patient Name: [name] MRN: [id number] DOB:1965/07/17, 56 y.o., female Today's Date: 02/04/2022   PT End of Session - 02/04/22 1626     Visit Number 11    Number of Visits 12    Date for PT Re-Evaluation 02/04/22    Authorization Type UHC [id number] used 7 previously    PT Start Time 1622   pt late   PT Stop Time 1700    PT Time Calculation (min) 38 min    Activity Tolerance Patient tolerated treatment well    Behavior During Therapy WFL for tasks assessed/performed                  Past Medical History:  Diagnosis Date   Allergic rhinitis    Anemia    Anxiety    Asthma    Contact dermatitis and eczema    Diverticulitis    CT Scan   Esophageal reflux    H. pylori infection    Hyperlipidemia, mixed    IBS (irritable bowel syndrome)    IC (interstitial cystitis)    Internal hemorrhoids    Lumbago    Mitral valve disorders(424.0)    Osteoarthritis 07/08/2015   Post concussive syndrome    Seasonal and perennial allergic rhinitis 01/20/2015   Past Surgical History:  Procedure Laterality Date   BARTHOLIN CYST MARSUPIALIZATION N/A 12/18/2013   Procedure: BARTHOLIN CYST MARSUPIALIZATION WITH BIOPSY;  Surgeon: Lovenia Kim, MD;  Location: [facility];  Service: Gynecology;  Laterality: N/A;   CESAREAN SECTION     DILITATION &  CURRETTAGE/HYSTROSCOPY WITH NOVASURE ABLATION N/A 10/14/2012   Procedure: DILATATION & CURETTAGE/HYSTEROSCOPY WITH NOVASURE ABLATION;  Surgeon: Lovenia Kim, MD;  Location: [facility];  Service: Gynecology;  Laterality: N/A;   MOUTH SURGERY     SINUS SURGERY WITH INSTATRAK     Patient Active Problem List   Diagnosis Date Noted   Closed fracture of phalanx of right great toe 11/19/2021   Raynaud disease 08/26/2021   renal insufficiency 01/07/2021   Varicose veins of both lower extremities 10/28/2020   Hemorrhoids 10/28/2020   Hyperglycemia 04/01/2020   Nonintractable headache 12/31/2019   Right leg pain 11/19/2019   Constipation 11/17/2019   Neck pain 09/25/2019   Educated about COVID-19 virus infection 06/21/2019   Insomnia 02/19/2019   Anxiety and depression 01/13/2019   Perimenopausal 70/35/0093   History of Helicobacter pylori infection 12/28/2017   Nocturia 07/19/2016   Rash and nonspecific skin eruption 03/21/2016   Post concussive syndrome 01/22/2016   Osteoarthritis 07/08/2015   Hematuria 01/20/2015   Hyperlipidemia, mild 01/20/2015   Seasonal and perennial allergic rhinitis 01/20/2015   Fatigue 10/24/2014   Arthralgia 08/20/2013   Diverticulosis 07/01/2011   Menopausal state 03/25/2011   Fibroids 10/24/2010   Anemia 03/25/2010   IRRITABLE BOWEL SYNDROME 03/25/2010   Mitral valve disorder 07/09/2007  Allergic asthma, mild intermittent, uncomplicated 41/96/2229   ESOPHAGEAL REFLUX 07/09/2007   LOW BACK PAIN 12/14/2006    PCP: Mosie Lukes., MD  REFERRING PROVIDER: Mosie Lukes., MD  REFERRING DIAG: [phone] (ICD-10-CM) - Arthralgia of right foot M79.604 (ICD-10-CM) - Right leg pain  THERAPY DIAG:  Acute pain of right knee  Pain in right foot  Muscle weakness (generalized)  Rationale for Evaluation and Treatment Rehabilitation  ONSET DATE: 09/25/2021  SUBJECTIVE:   SUBJECTIVE STATEMENT:  The toe has been throbbing on and off but the massaging has  helped with the pain.   PERTINENT HISTORY: post concussion syndrome, R great toe fracture, history migraines,  PAIN:  Are you having pain? No   PRECAUTIONS: None  WEIGHT BEARING RESTRICTIONS No  FALLS:  Has patient fallen in last 6 months? Yes. Number of falls 1, slipped turning around.   LIVING ENVIRONMENT: Lives with: lives with their family Lives in: House/apartment Stairs: Yes: Internal: 12 steps; on right going up Has following equipment at home: None  OCCUPATION: social services/medicaid eligibility specialist  PLOF: Independent used to do Pilates and run daily  PATIENT GOALS build up strength in right leg, be able to run again.    OBJECTIVE:   DIAGNOSTIC FINDINGS:  09/26/2021 Right great toe Xray IMPRESSION: Acute nondisplaced intra-articular fracture involving the base of the first distal phalanx.  12/16/2021 Right knee Xray: FINDINGS: No evidence of fracture, dislocation, or joint effusion. No evidence of arthropathy or other focal bone abnormality. Soft tissues are unremarkable.    PATIENT SURVEYS:  LEFS 47/80  COGNITION:  Overall cognitive status: Within functional limits for tasks assessed     SENSATION: WFL  EDEMA:  NA  MUSCLE LENGTH: Hamstrings: Right 90 deg; Left 90 deg  POSTURE: rounded shoulders and forward head  PALPATION: Tenderness R LCL, R vastus lateralis  LOWER EXTREMITY MMT:  MMT Right eval Left eval Right 02/03/22 Left 02/03/22  Hip flexion 4+ 4+ 5 5  Hip extension 4 4 4+ 5  Hip abduction 4+ 4+ 5 5  Hip adduction 4+ 4+  5 5  Knee flexion 4+ _0 Knee extension 5 5    Ankle dorsiflexion 5 5    Ankle plantarflexion 4+* _1 Ankle inversion      Ankle eversion       (Blank rows = not tested)  LOWER EXTREMITY ROM:  AROM Right eval Left eval Right 01/27/22 Right 02/03/22  Hip flexion      Hip extension      Hip abduction      Hip adduction      Hip internal rotation _2 Hip external rotation      Knee  flexion      Knee extension      Ankle dorsiflexion 0 _3 Ankle plantarflexion      Ankle inversion      Ankle eversion       (Blank rows = not tested)  LOWER EXTREMITY SPECIAL TESTS:  Knee special tests: Anterior drawer test: negative, Posterior drawer test: negative, and McMurray's test: negative  FUNCTIONAL TESTS:  5 times sit to stand: 11 seconds  GAIT: Distance walked: 87' Assistive device utilized: None Level of assistance: Complete Independence Comments: no significant deviation or device    TODAY'S TREATMENT: 02/04/22 Therapeutic Exercise: Treadmill 2.0 x 6 min LEFS- 73/80 Leg press 15# 2x10 BLE Single leg deadlifts x 10 bil Fwd lunges with TRX x 10  bil - difficulty with coordinating movement Side lunges with TRX x 10 bil Knee extension 10# x 20 BLE Knee flexion 15# x 20 BLE  01/30/2022 Therapeutic Exercise: to improve strength and mobility.  Demo, verbal and tactile cues throughout for technique. Treadmill  2.0 mph x 7 min  Single leg RDLs x 10 bil  Glut bridges x 10 Therapeutic Activity:  to assess progress towards goals.   Stairs, ROM Manual Therapy: to decrease muscle spasm and improve mobility STM to R lateral quads and gastroc, demo self STM with stick roller, mobs to R toes, 1st ray.    01/28/22 TherEx: Treadmill 2 mph x 8 min Knee extension 10# BLE x 10; R/L 5# x 10 Knee flexion 10# BLE x 10; R/L 5# x 10 Gastroc stretch on step 3x30 sec LLE Manual Therapy: STM to R gastroc muscle bellies,  distal hamstrings, TP found throughout medial and lateral gastroc muscle bellies  01/27/22 Therapeutic Exercise: Treadmill 2.5 mph x 6 min On trampouline:  Squats on trampouline x 10 Tandem stance 3 way LE reach flex, abd, ext bil 5x each  Standing shoulder extension 10# x 10 in tandem stance Standing rows 10# 2x10 Fwd step ups 6' step x 10 bil  01/22/2022 Therapeutic Exercise: to improve strength and mobility.  Demo, verbal and tactile cues  throughout for technique. Treadmill 2 mph x 8 min  Hip 90/90s 2 x 10  Hip extension from 90/90s x 5 bil  Hamstring curls on ball 2 x 10  Self STM with foam roller to low back - full extension x 10 Self STM with foam roller to hamstrings and calves x 10  Manual Therapy: to decrease muscle spasm and pain and improve mobility IASTM with s/s tools to bil hamstrings, calves, R quad.      PATIENT EDUCATION:  Education details: progressed HEP 9/21, 9/25, 10/10.  Person educated: Patient Education method: Explanation, Demonstration, Verbal cues, and Handouts Education comprehension: verbalized understanding and returned demonstration   HOME EXERCISE PROGRAM: Access Code: [email or web address]  ASSESSMENT:  CLINICAL IMPRESSION:   Leeanna has met LTG #5 for LEFS scoring 73/80. She showed a good tolerance for the exercises as we continued to progress proximal strengthening. She needed extra instruction with the fwd lunges for proper foot positioning. She demonstrated the most difficulty with single leg deadlifts. Briefly reviewed over HEP to ensure to understanding and instructed on a regimen to allow for rest days and recovery time. Pt has shown adequate progress to go on 30 day hold at this time.    OBJECTIVE IMPAIRMENTS decreased activity tolerance, decreased endurance, decreased ROM, decreased strength, increased fascial restrictions, increased muscle spasms, impaired flexibility, and pain.   ACTIVITY LIMITATIONS bending, standing, squatting, stairs, and locomotion level  PARTICIPATION LIMITATIONS: community activity and sports  PERSONAL FACTORS 1-2 comorbidities: history of post-concussion syndrome and vertigo, IBS, migraines  are also affecting patient's functional outcome.   REHAB POTENTIAL: Good  CLINICAL DECISION MAKING: Stable/uncomplicated  EVALUATION COMPLEXITY: Low   GOALS: Goals reviewed with patient? Yes  SHORT TERM GOALS: Target date: 01/07/2022   Patient will be independent  with initial HEP. Baseline: no HEP Goal status: MET -01/19/22 good compliance.     LONG TERM GOALS: Target date: 02/04/2022   Patient will be independent with advanced/ongoing HEP to improve outcomes and carryover.  Baseline:  Goal status: MET  2.  Patient will report at least 75% improvement in R knee pain with activity. Baseline: up to 7/10 pain with activity Goal status:  MET 02/03/22 - no knee pain now with activity, even running  3.  Patient will demonstrate 5/5 hip strength to support knee and improve kinematics.  Baseline: see objective Goal status: IN PROGRESS 02/03/22 - almost met except 4+/5 R hip extension.  4. Patient will be able to ascend/descend stairs with 1 HR and reciprocal step pattern  without increased knee pain to access.  Baseline: pain descending Goal status: MET - 01/27/22  ascends/descends without HR, reciprocal step, no pain.   5.  Patient will report >56/80 on LEFS to demonstrate improved functional ability. Baseline: 47/80 Goal status: MET - 02/04/22  6. Patient will demonstrate R hip and ankle AROM = LLE AROM.   Baseline: tightness R hip IR, R ankle DF Goal status: IN PROGRESS - 02/03/22- almost met, slightly tighter in R calf  7.  Patient will be able to run x 50' without increased R knee/foot pain.  Baseline: unable Goal status: MET  02/03/22- has started running again without knee pain.      PLAN: PT FREQUENCY: 1-2x/week  PT DURATION: 6 weeks  PLANNED INTERVENTIONS: Therapeutic exercises, Therapeutic activity, Neuromuscular re-education, Balance training, Gait training, Patient/Family education, Self Care, Joint mobilization, Stair training, Orthotic/Fit training, Dry Needling, Electrical stimulation, Cryotherapy, Moist heat, scar mobilization, Taping, Vasopneumatic device, Ultrasound, Ionotophoresis 44m/ml Dexamethasone, Manual therapy, and Re-evaluation  PLAN FOR NEXT SESSION: 30 day hold   Shailyn Weyandt L CCarlis Abbott PTA 02/04/2022, 5:07 PM

## 2022-02-18 ENCOUNTER — Other Ambulatory Visit: Payer: Self-pay | Admitting: Family Medicine

## 2022-02-18 ENCOUNTER — Telehealth: Payer: Self-pay | Admitting: Family Medicine

## 2022-02-18 NOTE — Telephone Encounter (Signed)
Called pt lvm was advised to call around find a pharmacy that has it and  We will get it sent in.

## 2022-02-18 NOTE — Telephone Encounter (Signed)
Patient states there is a new Covid vaccine called Novavax  and it comes in 2 dosages. Patient would like to know if Dr. Charlett Blake can prescribe the vaccine to her and that way she can take it to a pharmacy and get it done. Please advise.

## 2022-02-20 NOTE — Telephone Encounter (Signed)
Pt would like a call from nurse once rx has been put in for covid vaccine.   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

## 2022-02-23 ENCOUNTER — Other Ambulatory Visit: Payer: Self-pay

## 2022-02-23 MED ORDER — NOVAVAX COVID-19 VACCINE 5 MCG/0.5ML IM SUSP: 5.0000 ug | Freq: Once | INTRAMUSCULAR | 0 refills | Status: AC

## 2022-02-23 NOTE — Telephone Encounter (Signed)
Sent in

## 2022-04-03 ENCOUNTER — Telehealth: Payer: Self-pay | Admitting: Family Medicine

## 2022-04-03 NOTE — Telephone Encounter (Signed)
Pt called stating she would like to have a follow up MRI done of her head as she stated she is still having issues with her headaches and fuzzy feeling during some of them.

## 2022-04-04 NOTE — Progress Notes (Unsigned)
Patient ID: [name], female    DOB: Nov 09, 1965, 56 y.o.   MRN: [id number]  HPI female never smoker followed for allergic rhinitis, asthma. Complicated by History anxiety, GERD, IBS Office Spirometry 08/01/2015-within normal. FVC 3.02/112%, FEV1 2.36/104%, FEV1/FVC 0.78, FEF 25-75 percent 2.26/84%. PFT 11/26/2010 within normal limits within significant response to bronchodilator. FEV1/FVC 0.79  --------------------------------------------------------------------------------  07/25/21- 56 year old female never smoker followed for Allergic Rhinitis, Asthma.  Complicated by GERD, history Anxiety, IBS -Neb albuterol, albuterol hfa, benzonatate,  Covid vax-4 Phizer,  Flu vax-had ACT score 24 Asked about lymphocytosis. Has hx nasal polypectomy, inviting consideration of Biologic therapy. Emphasized use of nasal steroid. CXR 05/25/21- IMPRESSION: No active cardiopulmonary disease.  04/07/22- 56 year old female never smoker followed for Allergic Rhinitis, Asthma.  Complicated by GERD, history Anxiety, IBS -Neb albuterol, albuterol hfa, benzonatate,  Covid vax-4 Phizer,  Flu vax- She feels she may have a sinus infection lingering over the past 2 months with some bitemporal headache, nasal congestion some sense of postnasal drainage and mild shortness of breath.  She has not been acutely ill or febrile.  No purulent discharge.  Has not used her rescue inhaler in a very long time.  She has been feeling some superficial tenderness at the left lateral lower costal margin trauma or rash. Her PCP had suggested she might want to consider getting a sleep study done.  She sleeps separately from her husband but has been startling awake at night and thinks it is her breathing.  Told in the past that she snored.   Review of Systems-see HPI     + = positive Constitutional:   No-   weight loss, night sweats, fevers, chills, fatigue, lassitude. HEENT:    +headaches, difficulty swallowing, tooth/dental  problems, sore throat,       No-  sneezing, itching, ear ache, +nasal congestion, +post nasal drip,  CV:  L flank chest pain, orthopnea, PND, swelling in lower extremities, anasarca, dizziness, palpitations Resp: shortness of breath with exertion or at rest.                +non-productive cough,  No- coughing up of blood.                 change in color of mucus.  No recent  wheezing.   Skin: No-   rash or lesions. GI:  No-   heartburn, indigestion, abdominal pain, nausea, vomiting, GU:  MS: + joint pain or swelling.   Neuro-     nothing unusual Psych:  No- change in mood or affect. No depression + anxiety.  No memory loss.  Objective:   Physical Exam General- Alert, Oriented, Affect-anxious/ talkative, Distress- none acute, trim, petite woman    Skin- rash-none, lesions- none, excoriation- none Lymphadenopathy- none Head- atraumatic            Eyes- Gross vision intact, PERRLA, conjunctivae clear secretions            Ears- Hearing, canals normal. +Scarring or sclerosis TMs            Nose- +Narrow with turbinate edema, clear mucus bridging, No- Septal dev, polyps, erosion, perforation             Throat- Mallampati III , mucosa clear , drainage- none, tonsils- atrophic Neck- flexible , trachea midline, no stridor , thyroid nl, carotid no bruit Chest - symmetrical excursion , unlabored           Heart/CV- RRR , no murmur , no gallop  ,  no rub, nl s1 s2                           - JVD- none , edema- none, stasis changes- none, varices- none           Lung- clear to P&A, wheeze- none, cough- none , dullness-none, rub- none           Chest wall-  Abd-  Br/ Gen/ Rectal- Not done, not indicated Extrem- cyanosis- none, clubbing, none, atrophy- none, strength- nl Neuro- grossly intact to observation

## 2022-04-07 ENCOUNTER — Encounter: Payer: Self-pay | Admitting: Internal Medicine

## 2022-04-07 ENCOUNTER — Ambulatory Visit (INDEPENDENT_AMBULATORY_CARE_PROVIDER_SITE_OTHER): Payer: 59

## 2022-04-07 ENCOUNTER — Ambulatory Visit: Payer: 59 | Admitting: Internal Medicine

## 2022-04-07 VITALS — BP 110/70 | HR 87 | Ht 59.0 in | Wt 108.4 lb

## 2022-04-07 DIAGNOSIS — R109 Unspecified abdominal pain: Secondary | ICD-10-CM

## 2022-04-07 DIAGNOSIS — J45909 Unspecified asthma, uncomplicated: Secondary | ICD-10-CM

## 2022-04-07 DIAGNOSIS — J0101 Acute recurrent maxillary sinusitis: Secondary | ICD-10-CM

## 2022-04-07 DIAGNOSIS — J4520 Mild intermittent asthma, uncomplicated: Secondary | ICD-10-CM

## 2022-04-07 DIAGNOSIS — R0683 Snoring: Secondary | ICD-10-CM | POA: Diagnosis not present

## 2022-04-07 MED ORDER — DOXYCYCLINE HYCLATE 100 MG PO TABS: 100.0000 mg | ORAL_TABLET | Freq: Two times a day (BID) | ORAL | 0 refills | Status: DC

## 2022-04-07 MED ORDER — ALBUTEROL SULFATE HFA 108 (90 BASE) MCG/ACT IN AERS: INHALATION_SPRAY | RESPIRATORY_TRACT | 6 refills | Status: DC

## 2022-04-07 NOTE — Assessment & Plan Note (Signed)
She may have a low-grade bilateral maxillary sinusitis Plan-doxycycline

## 2022-04-07 NOTE — Telephone Encounter (Signed)
Called pt and sent message to call our office to  Make appt to discuss

## 2022-04-07 NOTE — Assessment & Plan Note (Signed)
Minimal wheeze lying down Plan-we can refill her Ventolin rescue inhaler for use if needed as discussed

## 2022-04-07 NOTE — Patient Instructions (Signed)
Scripts sent for doxycycline and for albuterol rescue inhaler  Order- CXR    dx asthmatic bronchitis, left flank pain   Order- schedule home sleep test     dx snoring  Please call us about 2 weeks after your sleep test, for results and recommendations

## 2022-04-07 NOTE — Assessment & Plan Note (Signed)
She questions possible sleep disordered breathing.  Sleeps alone.  Describes episodic waking which she thinks is due to her breathing. Plan-Home sleep test

## 2022-04-16 ENCOUNTER — Ambulatory Visit: Payer: 59 | Admitting: Family Medicine

## 2022-05-13 ENCOUNTER — Other Ambulatory Visit: Payer: Self-pay | Admitting: Obstetrics and Gynecology

## 2022-05-13 ENCOUNTER — Telehealth: Payer: Self-pay | Admitting: Family Medicine

## 2022-05-13 DIAGNOSIS — Z1231 Encounter for screening mammogram for malignant neoplasm of breast: Secondary | ICD-10-CM

## 2022-05-13 NOTE — Telephone Encounter (Signed)
Patient called to see if she can order for bone density test. Please advise when order in.

## 2022-05-14 ENCOUNTER — Other Ambulatory Visit: Payer: Self-pay | Admitting: Family Medicine

## 2022-05-14 ENCOUNTER — Other Ambulatory Visit: Payer: Self-pay

## 2022-05-14 ENCOUNTER — Ambulatory Visit: Payer: 59

## 2022-05-14 DIAGNOSIS — G4733 Obstructive sleep apnea (adult) (pediatric): Secondary | ICD-10-CM

## 2022-05-14 DIAGNOSIS — E2839 Other primary ovarian failure: Secondary | ICD-10-CM

## 2022-05-14 DIAGNOSIS — R0683 Snoring: Secondary | ICD-10-CM

## 2022-05-14 DIAGNOSIS — Z780 Asymptomatic menopausal state: Secondary | ICD-10-CM

## 2022-05-15 DIAGNOSIS — G4733 Obstructive sleep apnea (adult) (pediatric): Secondary | ICD-10-CM | POA: Diagnosis not present

## 2022-06-25 ENCOUNTER — Other Ambulatory Visit (HOSPITAL_BASED_OUTPATIENT_CLINIC_OR_DEPARTMENT_OTHER): Payer: 59

## 2022-06-29 ENCOUNTER — Other Ambulatory Visit (HOSPITAL_BASED_OUTPATIENT_CLINIC_OR_DEPARTMENT_OTHER): Payer: 59

## 2022-06-30 ENCOUNTER — Ambulatory Visit
Admission: RE | Admit: 2022-06-30 | Discharge: 2022-06-30 | Disposition: A | Discharge status: Home / Self Care | Payer: 59 | Source: Ambulatory Visit | Attending: Obstetrics and Gynecology | Admitting: Obstetrics and Gynecology

## 2022-06-30 DIAGNOSIS — Z1231 Encounter for screening mammogram for malignant neoplasm of breast: Secondary | ICD-10-CM

## 2022-07-01 ENCOUNTER — Ambulatory Visit: Payer: 59

## 2022-07-07 ENCOUNTER — Ambulatory Visit (HOSPITAL_BASED_OUTPATIENT_CLINIC_OR_DEPARTMENT_OTHER)
Admission: RE | Admit: 2022-07-07 | Discharge: 2022-07-07 | Disposition: A | Discharge status: Home / Self Care | Payer: 59 | Source: Ambulatory Visit | Attending: Family Medicine | Admitting: Family Medicine

## 2022-07-07 DIAGNOSIS — E2839 Other primary ovarian failure: Secondary | ICD-10-CM | POA: Insufficient documentation

## 2022-07-07 DIAGNOSIS — Z780 Asymptomatic menopausal state: Secondary | ICD-10-CM

## 2022-07-14 ENCOUNTER — Encounter: Payer: 59 | Admitting: Family Medicine

## 2022-07-14 ENCOUNTER — Encounter: Payer: Self-pay | Admitting: Family Medicine

## 2022-07-14 ENCOUNTER — Ambulatory Visit (INDEPENDENT_AMBULATORY_CARE_PROVIDER_SITE_OTHER): Payer: 59 | Admitting: Family Medicine

## 2022-07-14 VITALS — BP 110/72 | HR 81 | Temp 98.0°F | Resp 16 | Ht 59.0 in | Wt 110.0 lb

## 2022-07-14 DIAGNOSIS — G8929 Other chronic pain: Secondary | ICD-10-CM | POA: Diagnosis not present

## 2022-07-14 DIAGNOSIS — M79601 Pain in right arm: Secondary | ICD-10-CM | POA: Diagnosis not present

## 2022-07-14 DIAGNOSIS — Z0000 Encounter for general adult medical examination without abnormal findings: Secondary | ICD-10-CM | POA: Diagnosis not present

## 2022-07-14 DIAGNOSIS — R10A2 Flank pain, left side: Secondary | ICD-10-CM

## 2022-07-14 DIAGNOSIS — H04123 Dry eye syndrome of bilateral lacrimal glands: Secondary | ICD-10-CM

## 2022-07-14 DIAGNOSIS — M2550 Pain in unspecified joint: Secondary | ICD-10-CM

## 2022-07-14 DIAGNOSIS — R109 Unspecified abdominal pain: Secondary | ICD-10-CM

## 2022-07-14 DIAGNOSIS — N186 End stage renal disease: Secondary | ICD-10-CM | POA: Diagnosis not present

## 2022-07-14 DIAGNOSIS — R739 Hyperglycemia, unspecified: Secondary | ICD-10-CM | POA: Diagnosis not present

## 2022-07-14 DIAGNOSIS — E785 Hyperlipidemia, unspecified: Secondary | ICD-10-CM | POA: Diagnosis not present

## 2022-07-14 DIAGNOSIS — R319 Hematuria, unspecified: Secondary | ICD-10-CM

## 2022-07-14 NOTE — Assessment & Plan Note (Signed)
hgba1c acceptable, minimize simple carbs. Increase exercise as tolerated.  

## 2022-07-14 NOTE — Assessment & Plan Note (Signed)
Hydrate and monitor 

## 2022-07-14 NOTE — Assessment & Plan Note (Signed)
Encourage heart healthy diet such as MIND or DASH diet, increase exercise, avoid trans fats, simple carbohydrates and processed foods, consider a krill or fish or flaxseed oil cap daily.  °

## 2022-07-14 NOTE — Patient Instructions (Signed)
Preventive Care 40-58 Years Old, Female Preventive care refers to lifestyle choices and visits with your health care provider that can promote health and wellness. Preventive care visits are also called wellness exams. What can I expect for my preventive care visit? Counseling Your health care provider may ask you questions about your: Medical history, including: Past medical problems. Family medical history. Pregnancy history. Current health, including: Menstrual cycle. Method of birth control. Emotional well-being. Home life and relationship well-being. Sexual activity and sexual health. Lifestyle, including: Alcohol, nicotine or tobacco, and drug use. Access to firearms. Diet, exercise, and sleep habits. Work and work environment. Sunscreen use. Safety issues such as seatbelt and bike helmet use. Physical exam Your health care provider will check your: Height and weight. These may be used to calculate your BMI (body mass index). BMI is a measurement that tells if you are at a healthy weight. Waist circumference. This measures the distance around your waistline. This measurement also tells if you are at a healthy weight and may help predict your risk of certain diseases, such as type 2 diabetes and high blood pressure. Heart rate and blood pressure. Body temperature. Skin for abnormal spots. What immunizations do I need?  Vaccines are usually given at various ages, according to a schedule. Your health care provider will recommend vaccines for you based on your age, medical history, and lifestyle or other factors, such as travel or where you work. What tests do I need? Screening Your health care provider may recommend screening tests for certain conditions. This may include: Lipid and cholesterol levels. Diabetes screening. This is done by checking your blood sugar (glucose) after you have not eaten for a while (fasting). Pelvic exam and Pap test. Hepatitis B test. Hepatitis C  test. HIV (human immunodeficiency virus) test. STI (sexually transmitted infection) testing, if you are at risk. Lung cancer screening. Colorectal cancer screening. Mammogram. Talk with your health care provider about when you should start having regular mammograms. This may depend on whether you have a family history of breast cancer. BRCA-related cancer screening. This may be done if you have a family history of breast, ovarian, tubal, or peritoneal cancers. Bone density scan. This is done to screen for osteoporosis. Talk with your health care provider about your test results, treatment options, and if necessary, the need for more tests. Follow these instructions at home: Eating and drinking  Eat a diet that includes fresh fruits and vegetables, whole grains, lean protein, and low-fat dairy products. Take vitamin and mineral supplements as recommended by your health care provider. Do not drink alcohol if: Your health care provider tells you not to drink. You are pregnant, may be pregnant, or are planning to become pregnant. If you drink alcohol: Limit how much you have to 0-1 drink a day. Know how much alcohol is in your drink. In the [place] one drink equals one 12 oz bottle of beer (355 mL), one 5 oz glass of wine (148 mL), or one 1 oz glass of hard liquor (44 mL). Lifestyle Brush your teeth every morning and night with fluoride toothpaste. Floss one time each day. Exercise for at least 30 minutes 5 or more days each week. Do not use any products that contain nicotine or tobacco. These products include cigarettes, chewing tobacco, and vaping devices, such as e-cigarettes. If you need help quitting, ask your health care provider. Do not use drugs. If you are sexually active, practice safe sex. Use a condom or other form of protection to   prevent STIs. If you do not wish to become pregnant, use a form of birth control. If you plan to become pregnant, see your health care provider for a  prepregnancy visit. Take aspirin only as told by your health care provider. Make sure that you understand how much to take and what form to take. Work with your health care provider to find out whether it is safe and beneficial for you to take aspirin daily. Find healthy ways to manage stress, such as: Meditation, yoga, or listening to music. Journaling. Talking to a trusted person. Spending time with friends and family. Minimize exposure to UV radiation to reduce your risk of skin cancer. Safety Always wear your seat belt while driving or riding in a vehicle. Do not drive: If you have been drinking alcohol. Do not ride with someone who has been drinking. When you are tired or distracted. While texting. If you have been using any mind-altering substances or drugs. Wear a helmet and other protective equipment during sports activities. If you have firearms in your house, make sure you follow all gun safety procedures. Seek help if you have been physically or sexually abused. What's next? Visit your health care provider once a year for an annual wellness visit. Ask your health care provider how often you should have your eyes and teeth checked. Stay up to date on all vaccines. This information is not intended to replace advice given to you by your health care provider. Make sure you discuss any questions you have with your health care provider. Document Revised: 10/09/2020 Document Reviewed: 10/09/2020 Elsevier Patient Education  2023 Elsevier Inc.  

## 2022-07-14 NOTE — Assessment & Plan Note (Signed)
Notes left eye is dry lately. Can try hydrating drops and if no improvement she will return to her eye doctor

## 2022-07-14 NOTE — Assessment & Plan Note (Signed)
Had some tightness in right upper arm after exercise but it has resolved now

## 2022-07-14 NOTE — Progress Notes (Signed)
Subjective:   By signing my name below, I, [name], attest that this documentation has been prepared under the direction and in the presence of Mosie Lukes, MD., 07/14/2022.   Patient ID: [name], female    DOB: 1966/04/12, 57 y.o.   MRN: [id number]  No chief complaint on file.  HPI Patient is in today for a comprehensive physical exam and follow up on chronic medical concerns.  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  Left Eye Dryness Patient reports that recently her left eye has become dry and stiff. Denies itchiness/pain. She states that she recently saw her ophthalmologist prior to the onset of this, but they made no concerning discoveries. She will follow up with them.  Left Flank Pain Patient reports that several months ago, she started experiencing left flank that is causing pain in the left side of her ribs. She further reports having pain/tenderness in her left breast and suspects this is due to sleeping on her left side. She states that she is awakening twice nightly to urinate and is interested in seeing a new urologist. Denies CP/palpitations/SOB/HA/fever/chills/GI symptoms.  Myalgias Patient reports that she has been experiencing intermittent myalgias bilaterally in her arms and legs that occur sporadically. She states that these myalgias only last several seconds and denies that they are causing terrible pain or keeping her awake at night. Additionally, she states that when her anxiety spikes, she feels a burning sensation in her left arm. She further reports that her upper right arm was aching several weeks ago, but this has now resolved.   Supplements She continues taking probiotics daily.  Weight Management Patient's body mass index is appropriate at 22.22 kg/m  but she states that she is unhappy with her  weight and has been unable to lose belly fat. Wt Readings from Last 3 Encounters:  07/14/22 110 lb (49.9 kg)  04/07/22 108 lb 6.4 oz (49.2 kg)  12/16/21 106 lb 9.6 oz (48.4 kg)   Past Medical History:  Diagnosis Date   Allergic rhinitis    Anemia    Anxiety    Asthma    Contact dermatitis and eczema    Diverticulitis    CT Scan   Esophageal reflux    H. pylori infection    Hyperlipidemia, mixed    IBS (irritable bowel syndrome)    IC (interstitial cystitis)    Internal hemorrhoids    Lumbago    Mitral valve disorders(424.0)    Osteoarthritis 07/08/2015   Post concussive syndrome    Seasonal and perennial allergic rhinitis 01/20/2015   Past Surgical History:  Procedure Laterality Date   BARTHOLIN CYST MARSUPIALIZATION N/A 12/18/2013   Procedure: BARTHOLIN CYST MARSUPIALIZATION WITH BIOPSY;  Surgeon: Lovenia Kim, MD;  Location: [facility];  Service: Gynecology;  Laterality: N/A;   CESAREAN SECTION     DILITATION & CURRETTAGE/HYSTROSCOPY WITH NOVASURE ABLATION N/A 10/14/2012   Procedure: DILATATION & CURETTAGE/HYSTEROSCOPY WITH NOVASURE ABLATION;  Surgeon: Lovenia Kim, MD;  Location: [facility];  Service: Gynecology;  Laterality: N/A;   MOUTH SURGERY     SINUS SURGERY WITH INSTATRAK      Family History  Problem Relation Age of Onset   Diabetes Mother    Hypertension Mother    Heart attack Mother        MI at age 38   Alcohol abuse Mother  smoker   Heart disease Mother    Dementia Mother    Hypertension Father    Hyperlipidemia Father    Heart attack Father    Lung cancer Father        smoking hx; dx > 48   Colon polyps Sister    Mental illness Sister        anxiety from 9/11 survivors   Varicose Veins Sister    Osteoporosis Sister    Other Sister        hypoglycemia   Varicose Veins Sister    Rheum arthritis Sister    Pleurisy Sister    Fibromyalgia Sister    Mental retardation Sister        depression   Leukemia Brother        acute; dx 35s    Colon cancer Paternal Uncle        dx unknown age   Dementia Maternal Grandmother    Heart disease Maternal Grandfather        mi   Cancer Paternal Grandmother        breast/ovairan before age 57   GER disease Son    Alcohol abuse Other        Family history   Breast cancer Neg Hx     Social History   Socioeconomic History   Marital status: Married    Spouse name: Not on file   Number of children: 2   Years of education: Masters   Highest education level: Not on file  Occupational History   Occupation: Careers adviser  Tobacco Use   Smoking status: Never   Smokeless tobacco: Never  Vaping Use   Vaping Use: Never used  Substance and Sexual Activity   Alcohol use: Not Currently   Drug use: No   Sexual activity: Yes    Comment: lives with husband and son, 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   Right-handed.   1 cup caffeine daily.   Social Determinants of Health   Financial Resource Strain: Not on file  Food Insecurity: Not on file  Transportation Needs: Not on file  Physical Activity: Not on file  Stress: Not on file  Social Connections: Not on file  Intimate Partner Violence: Not on file    Outpatient Medications Prior to Visit  Medication Sig Dispense Refill   albuterol (PROVENTIL) (2.5 MG/3ML) 0.083% nebulizer solution Take 3 mLs (2.5 mg total) by nebulization every 6 (six) hours as needed for wheezing or shortness of breath. 75 mL 12   albuterol (VENTOLIN HFA) 108 (90 Base) MCG/ACT inhaler Inhale 2 puffs into the lungs every 6 (six) hours as needed for wheezing or shortness of breath. 18 g 1   albuterol (VENTOLIN HFA) 108 (90 Base) MCG/ACT inhaler Inhale 2 puffs every 6 hours if needed 8 g 6   doxycycline (VIBRA-TABS) 100 MG tablet Take 1 tablet (100 mg total) by mouth 2 (two) times daily. 14 tablet 0   Multiple  Vitamins-Minerals (MULTIVITAMIN ADULT PO) Take 1 tablet by mouth daily.     Nebulizers (PORTABLE COMPRESSOR NEBULIZER) MISC Use as directed 1 each PRN   OVER THE COUNTER MEDICATION Calcium     OVER THE COUNTER MEDICATION Omega 3 2100     Probiotic Product (PROBIOTIC DAILY PO) Take 1 tablet by mouth daily.     sodium  chloride (OCEAN) 0.65 % SOLN nasal spray Place 1 spray into both nostrils as needed for congestion.     No facility-administered medications prior to visit.    Allergies  Allergen Reactions   Amoxicillin Shortness Of Breath    Doesn't remember rxn   Aspirin Shortness Of Breath    "irritated stomach" Ibuprofen and Naproxen do not agree with her either   Codeine Hives, Shortness Of Breath and Other (See Comments)    Does not take percocet or hydrocodone; tylenol only   Latex Shortness Of Breath, Itching and Other (See Comments)   Other Shortness Of Breath and Other (See Comments)    peanuts   Sulfa Antibiotics Hives and Shortness Of Breath   Sulfamethoxazole-Trimethoprim Shortness Of Breath   Sulfonamide Derivatives Shortness Of Breath and Itching   Sulphadimidine Sodium [Sulfamethazine Sodium] Shortness Of Breath    Added to food for preservative   Ciprofloxacin Other (See Comments)    REACTION: SOB, "Tightness in head" REACTION: SOB, "Tightness in head" Syncope    Azithromycin Other (See Comments)    Faintness   Carisoprodol    Clarithromycin     "Doesn't agree with me"   Epinephrine     Heart racing   Fenofibrate Other (See Comments)    Leg cramps, body aches    Metronidazole Other (See Comments)    "cannot tolerate"   Nsaids    Penicillins     Doesn't remember reaction   Tolmetin    Omeprazole Palpitations    Review of Systems  Constitutional:  Negative for chills and fever.  Eyes:        (+) left eye dryness.  Respiratory:  Negative for shortness of breath.   Cardiovascular:  Negative for chest pain and palpitations.  Gastrointestinal:  Negative  for abdominal pain, blood in stool, constipation, diarrhea, nausea and vomiting.  Genitourinary:  Positive for flank pain (left flank pain).  Musculoskeletal:  Positive for myalgias (bilaterally in arms and legs).       (+) left breast pain/tenderness.  Skin:           Neurological:  Negative for headaches.      Objective:    Physical Exam Constitutional:      General: She is not in acute distress.    Appearance: Normal appearance. She is normal weight. She is not ill-appearing.  HENT:     Head: Normocephalic and atraumatic.     Right Ear: Tympanic membrane, ear canal and external ear normal.     Left Ear: Tympanic membrane, ear canal and external ear normal.     Nose: Nose normal.     Mouth/Throat:     Mouth: Mucous membranes are moist.     Pharynx: Oropharynx is clear.  Eyes:     General:        Right eye: No discharge.        Left eye: No discharge.     Extraocular Movements: Extraocular movements intact.     Right eye: No nystagmus.     Left eye: No nystagmus.     Pupils: Pupils are equal, round, and reactive to light.  Neck:     Vascular: No carotid bruit.  Cardiovascular:     Rate and Rhythm: Normal rate and regular rhythm.     Pulses: Normal pulses.     Heart sounds: Normal heart sounds. No murmur heard.    No gallop.  Pulmonary:     Effort: Pulmonary effort is normal. No respiratory distress.  Breath sounds: Normal breath sounds. No wheezing or rales.  Abdominal:     General: Bowel sounds are normal.     Palpations: Abdomen is soft.     Tenderness: There is no abdominal tenderness. There is no guarding.  Musculoskeletal:        General: Normal range of motion.     Cervical back: Normal range of motion.     Right lower leg: No edema.     Left lower leg: No edema.     Comments: Muscle strength 5/5 on upper and lower extremities.   Lymphadenopathy:     Cervical: No cervical adenopathy.  Skin:    General: Skin is warm and dry.  Neurological:     Mental  Status: She is alert and oriented to person, place, and time.     Sensory: Sensation is intact.     Motor: Motor function is intact.     Coordination: Coordination is intact.     Deep Tendon Reflexes:     Reflex Scores:      Patellar reflexes are 2+ on the right side and 2+ on the left side. Psychiatric:        Mood and Affect: Mood normal.        Behavior: Behavior normal.        Judgment: Judgment normal.     LMP 06/30/2016  Wt Readings from Last 3 Encounters:  04/07/22 108 lb 6.4 oz (49.2 kg)  12/16/21 106 lb 9.6 oz (48.4 kg)  10/01/21 104 lb 0.6 oz (47.2 kg)    Diabetic Foot Exam - Simple   No data filed    Lab Results  Component Value Date   WBC 3.4 (L) 11/17/2021   HGB 13.0 11/17/2021   HCT 37.5 11/17/2021   PLT 248.0 11/17/2021   GLUCOSE 138 (H) 11/17/2021   CHOL 201 (H) 11/17/2021   TRIG 101.0 11/17/2021   HDL 51.30 11/17/2021   LDLDIRECT 130.0 01/10/2021   LDLCALC 130 (H) 11/17/2021   ALT 12 11/17/2021   AST 16 11/17/2021   NA 137 11/17/2021   K 3.5 11/17/2021   CL 101 11/17/2021   CREATININE 0.64 11/17/2021   BUN 14 11/17/2021   CO2 28 11/17/2021   TSH 2.04 11/17/2021   HGBA1C 5.2 11/19/2021    Lab Results  Component Value Date   TSH 2.04 11/17/2021   Lab Results  Component Value Date   WBC 3.4 (L) 11/17/2021   HGB 13.0 11/17/2021   HCT 37.5 11/17/2021   MCV 98.7 11/17/2021   PLT 248.0 11/17/2021   Lab Results  Component Value Date   NA 137 11/17/2021   K 3.5 11/17/2021   CO2 28 11/17/2021   GLUCOSE 138 (H) 11/17/2021   BUN 14 11/17/2021   CREATININE 0.64 11/17/2021   BILITOT 1.3 (H) 11/17/2021   ALKPHOS 83 11/17/2021   AST 16 11/17/2021   ALT 12 11/17/2021   PROT 6.6 11/17/2021   ALBUMIN 4.3 11/17/2021   CALCIUM 8.9 11/17/2021   ANIONGAP 10 01/09/2019   GFR 98.69 11/17/2021   Lab Results  Component Value Date   CHOL 201 (H) 11/17/2021   Lab Results  Component Value Date   HDL 51.30 11/17/2021   Lab Results  Component  Value Date   LDLCALC 130 (H) 11/17/2021   Lab Results  Component Value Date   TRIG 101.0 11/17/2021   Lab Results  Component Value Date   CHOLHDL 4 11/17/2021   Lab Results  Component Value Date  HGBA1C 5.2 11/19/2021      Assessment & Plan:  Colonoscopy: Last completed on 07/11/2020. Impression: -Diverticulosis in the sigmoid colon. -The distal rectum and anal verge are normal on retroflexion view. -Non-bleeding internal hemorrhoids. -No specimens collected. Repeat in 10 years.  DEXA: Last completed on 07/07/2022. The BMD measured at AP Spine L1-L4 (L3) is 0.940 g/cm2 with a T-score of -1.9. This patient is considered OSTEOPENIC according to [facility]) criteria. Repeat in 2-5 years.  Mammogram: Last completed on 06/30/2022 with no mammographic evidence of malignancy. Repeat in 1-2 years.  Pap Smear: Last completed on 07/02/2021 with normal results. Repeat in 3-5 years.  Advanced Directives: Encouraged patient to complete advanced care planning documents.  Healthy Lifestyle: Encouraged 6-8 hours of sleep, heart healthy diet, 60-80 oz of non-alcohol/non-caffeinated fluids, and minimum of 4000 steps daily.  Immunizations: Reviewed patient's immunization history and encouraged annual COVID-19/Flu vaccinations.   Left Eye Dryness: Recommended otc hydrating eye drops such as "Refresh". Problem List Items Addressed This Visit   None  No orders of the defined types were placed in this encounter.  I, [name], personally preformed the services described in this documentation.  All medical record entries made by the scribe were at my direction and in my presence.  I have reviewed the chart and discharge instructions (if applicable) and agree that the record reflects my personal performance and is accurate and complete. 07/14/2022  I,Mohammed Iqbal,acting as a scribe for Penni Homans, MD.,have documented all relevant documentation on the behalf of Penni Homans, MD,as directed by  Penni Homans, MD while in the presence of Penni Homans, MD.  [name]

## 2022-07-14 NOTE — Assessment & Plan Note (Signed)
Check UA and culture 

## 2022-07-14 NOTE — Assessment & Plan Note (Signed)
Worse with certainly

## 2022-07-14 NOTE — Assessment & Plan Note (Signed)
Patient encouraged to maintain heart healthy diet, regular exercise, adequate sleep. Consider daily probiotics. Take medications as prescribed Labs ordered and reviewed Given and reviewed copy of ACP documents from [place] of State and encouraged to complete and return  [facility] 06/2022 repeat next year Pap 05/2021 repeat every 3-5 years Colonoscopy 2022 repeat in 10 years

## 2022-07-15 ENCOUNTER — Encounter: Payer: Self-pay | Admitting: Family Medicine

## 2022-07-15 ENCOUNTER — Telehealth: Payer: Self-pay | Admitting: Family Medicine

## 2022-07-15 LAB — RHEUMATOID FACTOR: Rheumatoid fact SerPl-aCnc: <14 IU/mL (ref <14)

## 2022-07-15 LAB — URINE CULTURE
MICRO NUMBER:: 14711723
Result:: NO GROWTH
SPECIMEN QUALITY:: ADEQUATE

## 2022-07-15 LAB — CBC WITH DIFFERENTIAL/PLATELET
Basophils Absolute: 0 10*3/uL (ref 0.0–0.1)
Basophils Relative: 0.8 % (ref 0.0–3.0)
Eosinophils Absolute: 0.1 10*3/uL (ref 0.0–0.7)
Eosinophils Relative: 3.3 % (ref 0.0–5.0)
HCT: 38.6 % (ref 36.0–46.0)
Hemoglobin: 13.3 g/dL (ref 12.0–15.0)
Lymphocytes Relative: 52 % — ABNORMAL HIGH (ref 12.0–46.0)
Lymphs Abs: 2.1 10*3/uL (ref 0.7–4.0)
MCHC: 34.6 g/dL (ref 30.0–36.0)
MCV: 98.2 fl (ref 78.0–100.0)
Monocytes Absolute: 0.4 10*3/uL (ref 0.1–1.0)
Monocytes Relative: 9 % (ref 3.0–12.0)
Neutro Abs: 1.4 10*3/uL (ref 1.4–7.7)
Neutrophils Relative %: 34.9 % — ABNORMAL LOW (ref 43.0–77.0)
Platelets: 280 10*3/uL (ref 150.0–400.0)
RBC: 3.93 Mil/uL (ref 3.87–5.11)
RDW: 12.2 % (ref 11.5–15.5)
WBC: 4.1 10*3/uL (ref 4.0–10.5)

## 2022-07-15 LAB — URINALYSIS, ROUTINE W REFLEX MICROSCOPIC
Bilirubin Urine: NEGATIVE
Ketones, ur: NEGATIVE
Leukocytes,Ua: NEGATIVE
Nitrite: NEGATIVE
Specific Gravity, Urine: <=1.005 — AB (ref 1.000–1.030)
Total Protein, Urine: NEGATIVE
Urine Glucose: NEGATIVE
Urobilinogen, UA: 0.2 (ref 0.0–1.0)
pH: 6 (ref 5.0–8.0)

## 2022-07-15 LAB — ANTI-NUCLEAR AB-TITER (ANA TITER): ANA Titer 1: 1:80 {titer} — ABNORMAL HIGH

## 2022-07-15 LAB — COMPREHENSIVE METABOLIC PANEL
ALT: 17 U/L (ref 0–35)
AST: 16 U/L (ref 0–37)
Albumin: 4.4 g/dL (ref 3.5–5.2)
Alkaline Phosphatase: 99 U/L (ref 39–117)
BUN: 9 mg/dL (ref 6–23)
CO2: 29 mEq/L (ref 19–32)
Calcium: 9.4 mg/dL (ref 8.4–10.5)
Chloride: 103 mEq/L (ref 96–112)
Creatinine, Ser: 0.55 mg/dL (ref 0.40–1.20)
GFR: 101.89 mL/min (ref >60.00)
Glucose, Bld: 96 mg/dL (ref 70–99)
Potassium: 3.8 mEq/L (ref 3.5–5.1)
Sodium: 139 mEq/L (ref 135–145)
Total Bilirubin: 0.6 mg/dL (ref 0.2–1.2)
Total Protein: 7.2 g/dL (ref 6.0–8.3)

## 2022-07-15 LAB — LIPID PANEL
Cholesterol: 202 mg/dL — ABNORMAL HIGH (ref 0–200)
HDL: 48 mg/dL (ref >39.00)
LDL Cholesterol: 123 mg/dL — ABNORMAL HIGH (ref 0–99)
NonHDL: 154.4
Total CHOL/HDL Ratio: 4
Triglycerides: 155 mg/dL — ABNORMAL HIGH (ref 0.0–149.0)
VLDL: 31 mg/dL (ref 0.0–40.0)

## 2022-07-15 LAB — HEMOGLOBIN A1C: Hgb A1c MFr Bld: 5.4 % (ref 4.6–6.5)

## 2022-07-15 LAB — ANA: Anti Nuclear Antibody (ANA): POSITIVE — AB

## 2022-07-15 LAB — TSH: TSH: 1.42 u[IU]/mL (ref 0.35–5.50)

## 2022-07-15 NOTE — Telephone Encounter (Signed)
Patient states she would like confirmation that the blood seen in her urine is not enough to cause worry and if she needs to see the urologist again. She also stated her LDL is still elevated and would like advise from Dr. Charlett Blake. Please advice.

## 2022-07-15 NOTE — Assessment & Plan Note (Signed)
Stay as active as able and hydrate well. Check RF and ANA

## 2022-07-16 ENCOUNTER — Other Ambulatory Visit: Payer: Self-pay | Admitting: Family Medicine

## 2022-07-16 ENCOUNTER — Other Ambulatory Visit: Payer: Self-pay

## 2022-07-16 DIAGNOSIS — M2550 Pain in unspecified joint: Secondary | ICD-10-CM

## 2022-07-16 DIAGNOSIS — R768 Other specified abnormal immunological findings in serum: Secondary | ICD-10-CM

## 2022-07-16 NOTE — Telephone Encounter (Signed)
Please advise 

## 2022-07-16 NOTE — Telephone Encounter (Signed)
Called pt was advised her LDL are down from last time  And stated understand. Spoke with pt she has appt with urology. Referral was sent to Rheumatology

## 2022-07-20 ENCOUNTER — Telehealth: Payer: Self-pay | Admitting: Family Medicine

## 2022-07-20 ENCOUNTER — Other Ambulatory Visit: Payer: Self-pay

## 2022-07-20 NOTE — Telephone Encounter (Signed)
Pt requested the lab results for her CBC differential to be sent over to her nutritionist, Stevphen Rochester, with [facility]. Their number is [phone]

## 2022-07-20 NOTE — Telephone Encounter (Signed)
Lab results are from 07/14/22

## 2022-07-20 NOTE — Telephone Encounter (Signed)
Got labs work fax over to [place].

## 2022-07-21 ENCOUNTER — Telehealth: Payer: Self-pay | Admitting: Family Medicine

## 2022-07-21 NOTE — Telephone Encounter (Signed)
Called pt to check per Dr.Blyth to see  What symptom pt was having. Pt stated just some congestion,  Pt was advised per Dr.Blyth to try over the counter Mucinex and Zyrtec for few days if don't work let us know.

## 2022-07-21 NOTE — Telephone Encounter (Signed)
Pt called stating that she is sick and was wondering if Dr. Charlett Blake could call in doxycline low dose for her to the [facility].

## 2022-07-27 ENCOUNTER — Telehealth: Payer: Self-pay | Admitting: Internal Medicine

## 2022-07-27 MED ORDER — METHYLPREDNISOLONE 4 MG PO TBPK: ORAL_TABLET | ORAL | 0 refills | Status: DC

## 2022-07-27 NOTE — Telephone Encounter (Signed)
[name] I spoke with patient and went over recommendations. She is wanting to know if prednisone can be a lower dose? She advises it doesn't sit well with her and can cause stomach pains. If the prednisone can't be lowered is there anything else she can take? Please advise

## 2022-07-27 NOTE — Telephone Encounter (Signed)
Dr. Annamaria Boots  I sent beth a message on this patient earlier. See those previous messages. Patient feels the prednisone beth is wanting to prescribe is still to much. She is wanting a Childrens dose because of anxiety and stomach problems. Is it okay to send in 10mg  or 5mg ? Please advise

## 2022-07-27 NOTE — Telephone Encounter (Signed)
Please send in prednisone taper 40mg  x 2 days, 30mg  x 2 days, 20mg  x 2 days, 10mg  x 2 days. Make sure she is using nasal spray like flonase or Nasacort along with saline irrigation such as a netti pot. She would benefit from oral antihistamine such as claritin, zyrtec or xyzal. Use Albuterol inhaler 2 puffs every 4-6 hours for breakthrough symptoms. Would hold off or antibiotics, most likely asthma flared due to seasonal allergies. If not better with above recommendations in 3-5 days we can re-consider.

## 2022-07-27 NOTE — Telephone Encounter (Signed)
I have sent in Medrol 4mg  dose pack to [facility] on file

## 2022-07-27 NOTE — Telephone Encounter (Signed)
Pt calling in bc she has lost her voice and having trouble coughing.

## 2022-07-27 NOTE — Telephone Encounter (Signed)
Pt called back about the prednisone. Stated to her that the message had been sent to Dr. Annamaria Boots but stated to her that he was not in the office.  Pt stated that she did not want to take 20mg  every day for 1 week due to having severe osteopenia and is worried about taking 20mg  every day for 1 week. She wants to know if she could do a taper starting with 20mg  and tapering off.  Pt has been on a low pred taper before which was prescribed by Dr. Harvie Heck. This was methylprednisolone (medrol dosepak) 4mg  tablet with instructions 5tab po day 1, 4tab po day 2, 3tab po day 3, 2tab po day 4, 1tab po day 5 which she said usually worked well for her.    Beth, please advise if you are okay with the prednisone being sent in for pt this way with the medrol dose pak 4mg ?   **Please route back to triage pool as I'm up front on phones**

## 2022-07-27 NOTE — Telephone Encounter (Signed)
Spoke with patient she complains of productive cough with yellow phlegm, wheezing, sob, and voice loss. Symptoms x 1 week Patient has been using Mucinex and natural remedies to help with symptoms but nothing is really helping   [name] can you please advise since Dr. Annamaria Boots is off   Pharmacy [facility] [address] [place]

## 2022-07-27 NOTE — Telephone Encounter (Signed)
Of course, we can do 20mg  x 1 week

## 2022-07-27 NOTE — Telephone Encounter (Signed)
Called and spoke with patient. She verbalized understanding.   Nothing further needed at time of call.  

## 2022-08-06 NOTE — Progress Notes (Signed)
Patient ID: [name], female    DOB: Nov 24, 1965, 57 y.o.   MRN: [id number]  HPI female never smoker followed for allergic rhinitis, asthma. Complicated by History anxiety, GERD, IBS Office Spirometry 08/01/2015-within normal. FVC 3.02/112%, FEV1 2.36/104%, FEV1/FVC 0.78, FEF 25-75 percent 2.26/84%. PFT 11/26/2010 within normal limits without significant response to bronchodilator. FEV1/FVC 0.79 HST 05/14/22- AHI 5.8/ hr, desaturation to 91%, body weight 108  --------------------------------------------------------------------------------   04/07/22- 57 year old female never smoker followed for Allergic Rhinitis, Asthma.  Complicated by GERD, history Anxiety, IBS -Neb albuterol, albuterol hfa, benzonatate,  Covid vax-4 Phizer,  Flu vax- She feels she may have a sinus infection lingering over the past 2 months with some bitemporal headache, nasal congestion some sense of postnasal drainage and mild shortness of breath.  She has not been acutely ill or febrile.  No purulent discharge.  Has not used her rescue inhaler in a very long time.  She has been feeling some superficial tenderness at the left lateral lower costal margin without trauma or rash. Her PCP had suggested she might want to consider getting a sleep study done.  She sleeps separately from her husband but has been startling awake at night and thinks it is her breathing.  Told in the past that she snored.  08/07/22- 57 year old female never smoker followed for Allergic Rhinitis, Asthma.  Complicated by GERD, history Anxiety, IBS, Osteopenia,  HST 05/14/22- AHI 5.8/ hr, desaturation to 91%, body weight 108 lbs -Neb albuterol, albuterol hfa, benzonatate,  Medrol dospak ( light) ordered by Clent Ridges, NP 07/27/22      Pt asked for light dosing due to osteopenia. -----Still coughing and gasping for air. Coughing is worse at night  Acute illness started about 3 weeks ago with sick exposure.  Some chilling on first day and since then cough  and malaise.  Sputum initially a little yellow but has cleared.  Now annoying cough, mostly dry, worse supine interfering with sleep.  We discussed treatment options.  She had had a light dose of methylprednisolone, trying to protect bones.  She was interested in getting a Depo-Medrol shot and I discussed this is additional steroid.  There does not seem to be an indication for antibiotic but we will check chest x-ray.  She can continue OTC cough management as reviewed and contact us if she does not do well. -We reviewed her sleep study showing negligible OSA barely above the upper limit of normal and not of clinical consequence.  What she had described was occasionally waking with a cough and then noticing that she gasped, without daytime sleepiness. CXR 04/07/22- IMPRESSION: No active cardiopulmonary disease.  Review of Systems-see HPI     + = positive Constitutional:   No-   weight loss, night sweats, fevers, chills, fatigue, lassitude. HEENT:    +headaches, difficulty swallowing, tooth/dental problems, sore throat,       No-  sneezing, itching, ear ache, +nasal congestion, +post nasal drip,  CV:  L flank chest pain, orthopnea, PND, swelling in lower extremities, anasarca, dizziness, palpitations Resp: shortness of breath with exertion or at rest.                +non-productive cough,  No- coughing up of blood.                 change in color of mucus.  No recent  wheezing.   Skin: No-   rash or lesions. GI:  No-   heartburn, indigestion, abdominal pain, nausea, vomiting, GU:  MS: + joint pain or swelling.   Neuro-     nothing unusual Psych:  No- change in mood or affect. No depression + anxiety.  No memory loss.  Objective:   Physical Exam General- Alert, Oriented, Affect-anxious/ talkative, Distress- none acute, trim, petite woman    Skin- rash-none, lesions- none, excoriation- none Lymphadenopathy- none Head- atraumatic            Eyes- Gross vision intact, PERRLA, conjunctivae clear  secretions            Ears- Hearing, canals normal. +Scarring or sclerosis TMs            Nose- +Narrow with turbinate edema, clear mucus bridging, No- Septal dev, polyps, erosion, perforation             Throat- Mallampati III , mucosa clear , drainage- none, tonsils- atrophic Neck- flexible , trachea midline, no stridor , thyroid nl, carotid no bruit Chest - symmetrical excursion , unlabored           Heart/CV- RRR , no murmur , no gallop  , no rub, nl s1 s2                           - JVD- none , edema- none, stasis changes- none, varices- none           Lung- clear to P&A, wheeze- none, cough+ once, after deep breaths, dullness-none, rub- none           Chest wall-  Abd-  Br/ Gen/ Rectal- Not done, not indicated Extrem- cyanosis- none, clubbing, none, atrophy- none, strength- nl Neuro- grossly intact to observation

## 2022-08-07 ENCOUNTER — Encounter: Payer: Self-pay | Admitting: Internal Medicine

## 2022-08-07 ENCOUNTER — Ambulatory Visit (INDEPENDENT_AMBULATORY_CARE_PROVIDER_SITE_OTHER): Payer: 59

## 2022-08-07 ENCOUNTER — Ambulatory Visit: Payer: 59 | Admitting: Internal Medicine

## 2022-08-07 VITALS — BP 114/68 | HR 91 | Ht 59.0 in | Wt 110.0 lb

## 2022-08-07 DIAGNOSIS — J4520 Mild intermittent asthma, uncomplicated: Secondary | ICD-10-CM

## 2022-08-07 DIAGNOSIS — J45909 Unspecified asthma, uncomplicated: Secondary | ICD-10-CM

## 2022-08-07 MED ORDER — METHYLPREDNISOLONE ACETATE 80 MG/ML IJ SUSP
80.0000 mg | Freq: Once | INTRAMUSCULAR | Status: AC
Start: 2022-08-07 — End: ?

## 2022-08-07 NOTE — Assessment & Plan Note (Signed)
Lingering cough after acute bronchitis, probably viral. Plan-Depo-Medrol, CXR, continue OTC cough meds, sips of liquids, throat lozenges

## 2022-08-07 NOTE — Patient Instructions (Signed)
Order- depo 80    dx asthmatic bronchitis  Order- CXR  dx asthmatic bronchitis

## 2022-08-10 ENCOUNTER — Telehealth: Payer: Self-pay | Admitting: Internal Medicine

## 2022-08-10 MED ORDER — AZITHROMYCIN 250 MG PO TABS: ORAL_TABLET | ORAL | 0 refills | Status: DC

## 2022-08-10 MED ORDER — ALBUTEROL SULFATE HFA 108 (90 BASE) MCG/ACT IN AERS: INHALATION_SPRAY | RESPIRATORY_TRACT | 6 refills | Status: DC

## 2022-08-10 MED ORDER — BENZONATATE 100 MG PO CAPS: 100.0000 mg | ORAL_CAPSULE | Freq: Three times a day (TID) | ORAL | 1 refills | Status: DC | PRN

## 2022-08-10 NOTE — Telephone Encounter (Signed)
Called and spoke with pt letting her know recs per CY and she verbalized understanding. Meds have been sent to pharmacy for pt. Nothing further needed.

## 2022-08-10 NOTE — Telephone Encounter (Signed)
Dr. Maple Hudson, please advise on this fo rpt.   Allergies  Allergen Reactions   Amoxicillin Shortness Of Breath    Doesn't remember rxn   Aspirin Shortness Of Breath    "irritated stomach" Ibuprofen and Naproxen do not agree with her either   Codeine Hives, Shortness Of Breath and Other (See Comments)    Does not take percocet or hydrocodone; tylenol only   Latex Shortness Of Breath, Itching and Other (See Comments)   Other Shortness Of Breath and Other (See Comments)    peanuts   Sulfa Antibiotics Hives and Shortness Of Breath   Sulfamethoxazole-Trimethoprim Shortness Of Breath   Sulfonamide Derivatives Shortness Of Breath and Itching   Sulphadimidine Sodium [Sulfamethazine Sodium] Shortness Of Breath    Added to food for preservative   Ciprofloxacin Other (See Comments)    REACTION: SOB, "Tightness in head" REACTION: SOB, "Tightness in head" Syncope    Azithromycin Other (See Comments)    Faintness   Carisoprodol    Clarithromycin     "Doesn't agree with me"   Epinephrine     Heart racing   Fenofibrate Other (See Comments)    Leg cramps, body aches    Metronidazole Other (See Comments)    "cannot tolerate"   Nsaids    Penicillins     Doesn't remember reaction   Tolmetin    Omeprazole Palpitations     Current Outpatient Medications:    albuterol (VENTOLIN HFA) 108 (90 Base) MCG/ACT inhaler, Inhale 2 puffs every 6 hours if needed, Disp: 8 g, Rfl: 6   Multiple Vitamins-Minerals (MULTIVITAMIN ADULT PO), Take 1 tablet by mouth daily., Disp: , Rfl:    Nebulizers (PORTABLE COMPRESSOR NEBULIZER) MISC, Use as directed, Disp: 1 each, Rfl: PRN   OVER THE COUNTER MEDICATION, Calcium, Disp: , Rfl:    OVER THE COUNTER MEDICATION, Omega 3 2100, Disp: , Rfl:    Probiotic Product (PROBIOTIC DAILY PO), Take 1 tablet by mouth daily., Disp: , Rfl:    sodium chloride (OCEAN) 0.65 % SOLN nasal spray, Place 1 spray into both nostrils as needed for congestion., Disp: , Rfl:   Current  Facility-Administered Medications:    methylPREDNISolone acetate (DEPO-MEDROL) injection 80 mg, 80 mg, Intramuscular, Once, Waymon Budge, MD

## 2022-08-10 NOTE — Telephone Encounter (Signed)
Ok to send Zpak 250 mg, # 6, 2 today then one daily                   Tessalon perles 100 mg, # 40, 1 every 8 hours as needed  For the nasal problem- I recommend she use otc  Afrin-  1 puff each nostril every 8-12 hours as needed for not more than 3 days. That will be better for her than the nasal treatment she refers to. Those nasal pieces are no longer available

## 2022-08-10 NOTE — Telephone Encounter (Signed)
Pt recently saw Dr. Maple Hudson. He said if her condition worsened to call us. Her symptoms are worsened cough and it sounds bronchial and phlegm and cough/gasping.   PT would like a Zpack Pearls (low dose if poss)   She also states she mentioned a nebulizer tx that worked well for her. Dr. Maple Hudson told her they do not do that anymore but she would like the nebulizer medication and nose piece needed to take that. It was a tx she had done here in the office last year.   [facility]: [facility] of [address].  [phone] is here #

## 2022-08-12 ENCOUNTER — Telehealth: Payer: Self-pay | Admitting: Internal Medicine

## 2022-08-12 NOTE — Telephone Encounter (Signed)
Occasional tessalon perles taken with an otc aid for constipation such as Miralax is unlikely to cause a problem.  Certainly ok to use otc cough syrup such as Delsym or Robitussin DM

## 2022-08-12 NOTE — Telephone Encounter (Signed)
Pt read online tessalon pearls can cause constipation and would like to get a cough syrup with out any narcotics in it.

## 2022-08-12 NOTE — Telephone Encounter (Signed)
Pt still has a bad cough and wants to know if she can get a cough syrup with out any stronger narcotics.  [facility]acy: [facility] [address]

## 2022-08-12 NOTE — Telephone Encounter (Signed)
Spoke with patient. Went over recc from Dr. Maple Hudson she verbalized understanding. NFN

## 2022-08-26 ENCOUNTER — Telehealth: Payer: Self-pay | Admitting: Family Medicine

## 2022-08-26 ENCOUNTER — Other Ambulatory Visit: Payer: Self-pay

## 2022-08-26 DIAGNOSIS — Z1211 Encounter for screening for malignant neoplasm of colon: Secondary | ICD-10-CM

## 2022-08-26 NOTE — Telephone Encounter (Signed)
Patient states she needs a letter stating she had her physical and the date she got it done for a job. She states the AVS does not work because it has personal information on it. Patient also stated she needs a color guard ordered since she does them yearly. Please advise.

## 2022-08-26 NOTE — Telephone Encounter (Signed)
Letter has been sent to her mychart and the color guard was ordered  As well.

## 2022-09-15 ENCOUNTER — Telehealth: Payer: Self-pay | Admitting: Internal Medicine

## 2022-09-15 ENCOUNTER — Other Ambulatory Visit: Payer: Self-pay | Admitting: Internal Medicine

## 2022-09-15 NOTE — Telephone Encounter (Signed)
Patient traveling next week. Would like Zpak called into pharmacy. Also would like refill for nasal spray. Pharmacy is [facility] [place]. Patient phone number is [phone].

## 2022-09-16 MED ORDER — AZITHROMYCIN 250 MG PO TABS: ORAL_TABLET | ORAL | 0 refills | Status: DC

## 2022-09-16 NOTE — Telephone Encounter (Signed)
Pt called the office requesting a zpak to be sent to the pharamcy. States that she has a lot of head congestion as well as sinus headaches and is coughing a lot getting up white phlegm that is thick. Pt also has some wheezing.  Pt said she is going to be travelling out of town next week and wants to try to get this better before going out of town.  Stated to pt that we did refill her nasal spray but she is now also requesting zpak and if anything else might be able to be prescribed along with it to help get her better prior to travelling.  Pharmacy pt uses is [facility] in [place], [place].  Dr. Maple Hudson, please advise on this for pt.   Allergies  Allergen Reactions   Amoxicillin Shortness Of Breath    Doesn't remember rxn   Aspirin Shortness Of Breath    "irritated stomach" Ibuprofen and Naproxen do not agree with her either   Codeine Hives, Shortness Of Breath and Other (See Comments)    Does not take percocet or hydrocodone; tylenol only   Latex Shortness Of Breath, Itching and Other (See Comments)   Other Shortness Of Breath and Other (See Comments)    peanuts   Sulfa Antibiotics Hives and Shortness Of Breath   Sulfamethoxazole-Trimethoprim Shortness Of Breath   Sulfonamide Derivatives Shortness Of Breath and Itching   Sulphadimidine Sodium [Sulfamethazine Sodium] Shortness Of Breath    Added to food for preservative   Ciprofloxacin Other (See Comments)    REACTION: SOB, "Tightness in head" REACTION: SOB, "Tightness in head" Syncope    Azithromycin Other (See Comments)    Faintness   Carisoprodol    Clarithromycin     "Doesn't agree with me"   Epinephrine     Heart racing   Fenofibrate Other (See Comments)    Leg cramps, body aches    Metronidazole Other (See Comments)    "cannot tolerate"   Nsaids    Penicillins     Doesn't remember reaction   Tolmetin    Omeprazole Palpitations     Current Outpatient Medications:    albuterol (VENTOLIN HFA) 108 (90 Base) MCG/ACT  inhaler, Inhale 2 puffs every 6 hours if needed, Disp: 8 g, Rfl: 6   azithromycin (ZITHROMAX) 250 MG tablet, Take two today and then one daily until finished., Disp: 6 tablet, Rfl: 0   benzonatate (TESSALON) 100 MG capsule, Take 1 capsule (100 mg total) by mouth every 8 (eight) hours as needed for cough., Disp: 40 capsule, Rfl: 1   cromolyn (NASALCROM) 5.2 MG/ACT nasal spray, SPRAY ONE SPRAY IN EACH NOSTRIL FOUR TIMES DAILY, Disp: 26 mL, Rfl: 12   Multiple Vitamins-Minerals (MULTIVITAMIN ADULT PO), Take 1 tablet by mouth daily., Disp: , Rfl:    Nebulizers (PORTABLE COMPRESSOR NEBULIZER) MISC, Use as directed, Disp: 1 each, Rfl: PRN   OVER THE COUNTER MEDICATION, Calcium, Disp: , Rfl:    OVER THE COUNTER MEDICATION, Omega 3 2100, Disp: , Rfl:    Probiotic Product (PROBIOTIC DAILY PO), Take 1 tablet by mouth daily., Disp: , Rfl:    sodium chloride (OCEAN) 0.65 % SOLN nasal spray, Place 1 spray into both nostrils as needed for congestion., Disp: , Rfl:   Current Facility-Administered Medications:    methylPREDNISolone acetate (DEPO-MEDROL) injection 80 mg, 80 mg, Intramuscular, Once, Waymon Budge, MD

## 2022-09-16 NOTE — Telephone Encounter (Signed)
Ok to send Zpak 250 ng, # 6, 2 today then one daily                   Refill Nasalcrom nasal spray x 1 year

## 2022-09-16 NOTE — Telephone Encounter (Signed)
Called and spoke with pt letting her know recs per Dr. Maple Hudson and she verbalized understanding. Rx for zpak sent to preferred pharmacy. Nothing further needed.

## 2022-10-14 ENCOUNTER — Ambulatory Visit: Payer: 59 | Admitting: Medical

## 2022-10-14 ENCOUNTER — Ambulatory Visit (HOSPITAL_BASED_OUTPATIENT_CLINIC_OR_DEPARTMENT_OTHER)
Admission: RE | Admit: 2022-10-14 | Discharge: 2022-10-14 | Disposition: A | Discharge status: Home / Self Care | Payer: 59 | Source: Ambulatory Visit | Attending: Medical | Admitting: Medical

## 2022-10-14 VITALS — BP 110/40 | HR 90 | Resp 18 | Ht 59.0 in | Wt 108.0 lb

## 2022-10-14 DIAGNOSIS — R062 Wheezing: Secondary | ICD-10-CM

## 2022-10-14 DIAGNOSIS — J40 Bronchitis, not specified as acute or chronic: Secondary | ICD-10-CM | POA: Insufficient documentation

## 2022-10-14 DIAGNOSIS — M940 Chondrocostal junction syndrome [Tietze]: Secondary | ICD-10-CM | POA: Diagnosis not present

## 2022-10-14 DIAGNOSIS — R079 Chest pain, unspecified: Secondary | ICD-10-CM | POA: Diagnosis not present

## 2022-10-14 LAB — TROPONIN I (HIGH SENSITIVITY): High Sens Troponin I: 3 ng/L (ref 2–17)

## 2022-10-14 MED ORDER — AZITHROMYCIN 250 MG PO TABS: ORAL_TABLET | ORAL | 0 refills | Status: AC

## 2022-10-14 MED ORDER — BENZONATATE 100 MG PO CAPS: 100.0000 mg | ORAL_CAPSULE | Freq: Three times a day (TID) | ORAL | 0 refills | Status: DC | PRN

## 2022-10-14 MED ORDER — BUDESONIDE-FORMOTEROL FUMARATE 160-4.5 MCG/ACT IN AERO: 2.0000 | INHALATION_SPRAY | Freq: Two times a day (BID) | RESPIRATORY_TRACT | 0 refills | Status: DC

## 2022-10-14 MED ORDER — NYSTATIN 100000 UNIT/GM EX CREA: 1.0000 | TOPICAL_CREAM | Freq: Two times a day (BID) | CUTANEOUS | 0 refills | Status: DC

## 2022-10-14 NOTE — Progress Notes (Signed)
Subjective:    Patient ID: [name], female    DOB: 15-Sep-1965, 57 y.o.   MRN: [id number]  HPI  Pt states since over the weekend she has cough and  chest congestion. States productive cough. Bringing up clear chunks of mucus. Symptoms. began on Saturday. No obvious fever, chills or sweats. She states some confusion since she speculated some hot flashe due to menopause.   Pt has been wheezing. She has hx of asthmatic bronchitis.  She states started albuterol 2 days ago. She is just using once a day. She feels like can't take full deep breath.   Pt states when she takes deep breath will get mild anterior chest pain. Also when she lies on her left side or when she coughs. She also noted pain doing pilates the other day.   Also small red dots under her left breast. Noticed this Sunday. Now is better.     Review of Systems  Constitutional:  Negative for chills, fatigue and fever.  Respiratory:  Negative for cough, chest tightness, shortness of breath and wheezing.   Cardiovascular:  Negative for chest pain and palpitations.       See hpi. More msk pain.  Gastrointestinal:  Negative for abdominal pain.  Musculoskeletal:  Negative for back pain.  Skin:  Positive for rash.  Neurological:  Negative for dizziness, syncope and headaches.  Hematological:  Negative for adenopathy. Does not bruise/bleed easily.  Psychiatric/Behavioral:  Negative for behavioral problems, decreased concentration and dysphoric mood.    Past Medical History:  Diagnosis Date   Allergic rhinitis    Anemia    Anxiety    Asthma    Contact dermatitis and eczema    Diverticulitis    CT Scan   Esophageal reflux    H. pylori infection    Hyperlipidemia, mixed    IBS (irritable bowel syndrome)    IC (interstitial cystitis)    Internal hemorrhoids    Lumbago    Mitral valve disorders(424.0)    Osteoarthritis 07/08/2015   Post concussive syndrome    Seasonal and perennial allergic rhinitis 01/20/2015      Social History   Socioeconomic History   Marital status: Married    Spouse name: Not on file   Number of children: 2   Years of education: Masters   Highest education level: Master's degree (e.g., MA, MS, MEng, MEd, MSW, MBA)  Occupational History   Occupation: Clinical cytogeneticist  Tobacco Use   Smoking status: Never   Smokeless tobacco: Never  Vaping Use   Vaping Use: Never used  Substance and Sexual Activity   Alcohol use: Not Currently   Drug use: No   Sexual activity: Yes    Comment: lives with husband and son, 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   Right-handed.   1 cup caffeine daily.   Social Determinants of Health   Financial Resource Strain: Low Risk  (10/13/2022)   Overall Financial Resource Strain (CARDIA)    Difficulty of Paying Living Expenses: Not very hard  Food Insecurity: Patient Declined (10/13/2022)   Hunger Vital Sign    Worried About Running Out of Food in the Last Year: Patient declined    Ran Out of Food in the Last Year: Patient declined  Transportation Needs: Unknown (10/13/2022)   PRAPARE - Transportation    Lack of  Transportation (Medical): No    Lack of Transportation (Non-Medical): Not on file  Physical Activity: Sufficiently Active (10/13/2022)   Exercise Vital Sign    Days of Exercise per Week: 5 days    Minutes of Exercise per Session: 60 min  Stress: No Stress Concern Present (10/13/2022)   [employer] of Occupational Health - Occupational Stress Questionnaire    Feeling of Stress : Only a little  Social Connections: Unknown (10/13/2022)   Social Connection and Isolation Panel [NHANES]    Frequency of Communication with Friends and Family: Three times a week    Frequency of Social Gatherings with Friends and Family: Patient declined    Attends Religious Services: Patient declined    Active Member  of Clubs or Organizations: No    Attends Engineer, structural: Not on file    Marital Status: Patient declined  Intimate Partner Violence: Not on file    Past Surgical History:  Procedure Laterality Date   BARTHOLIN CYST MARSUPIALIZATION N/A 12/18/2013   Procedure: BARTHOLIN CYST MARSUPIALIZATION WITH BIOPSY;  Surgeon: Lenoard Aden, MD;  Location: [facility];  Service: Gynecology;  Laterality: N/A;   CESAREAN SECTION     DILITATION & CURRETTAGE/HYSTROSCOPY WITH NOVASURE ABLATION N/A 10/14/2012   Procedure: DILATATION & CURETTAGE/HYSTEROSCOPY WITH NOVASURE ABLATION;  Surgeon: Lenoard Aden, MD;  Location: [facility];  Service: Gynecology;  Laterality: N/A;   MOUTH SURGERY     SINUS SURGERY WITH INSTATRAK      Family History  Problem Relation Age of Onset   Diabetes Mother    Hypertension Mother    Heart attack Mother        MI at age 78   Alcohol abuse Mother        smoker   Heart disease Mother    Dementia Mother    Hypertension Father    Hyperlipidemia Father    Heart attack Father    Lung cancer Father        smoking hx; dx > 50   Colon polyps Sister    Mental illness Sister        anxiety from 9/11 survivors   Varicose Veins Sister    Osteoporosis Sister    Other Sister        hypoglycemia   Varicose Veins Sister    Rheum arthritis Sister        lupus   Pleurisy Sister    Fibromyalgia Sister    Mental retardation Sister        depression   Leukemia Brother        acute; dx 73s   GER disease Son    Colon cancer Paternal Uncle        dx unknown age   Dementia Maternal Grandmother    Heart disease Maternal Grandfather        mi   Cancer Paternal Grandmother        breast/ovairan before age 93   Alcohol abuse Other        Family history   Breast cancer Neg Hx     Allergies  Allergen Reactions   Amoxicillin Shortness Of Breath    Doesn't remember rxn   Aspirin Shortness Of Breath    "irritated stomach" Ibuprofen and Naproxen do not agree with her  either   Codeine Hives, Shortness Of Breath and Other (See Comments)    Does not take percocet or hydrocodone; tylenol only   Latex Shortness Of Breath, Itching and Other (See Comments)  Other Shortness Of Breath and Other (See Comments)    peanuts   Sulfa Antibiotics Hives and Shortness Of Breath   Sulfamethoxazole-Trimethoprim Shortness Of Breath   Sulfonamide Derivatives Shortness Of Breath and Itching   Sulphadimidine Sodium [Sulfamethazine Sodium] Shortness Of Breath    Added to food for preservative   Ciprofloxacin Other (See Comments)    REACTION: SOB, "Tightness in head" REACTION: SOB, "Tightness in head" Syncope    Azithromycin Other (See Comments)    Faintness   Carisoprodol    Clarithromycin     "Doesn't agree with me"   Epinephrine     Heart racing   Fenofibrate Other (See Comments)    Leg cramps, body aches    Metronidazole Other (See Comments)    "cannot tolerate"   Nsaids    Penicillins     Doesn't remember reaction   Tolmetin    Omeprazole Palpitations    Current Outpatient Medications on File Prior to Visit  Medication Sig Dispense Refill   albuterol (VENTOLIN HFA) 108 (90 Base) MCG/ACT inhaler Inhale 2 puffs every 6 hours if needed 8 g 6   Multiple Vitamins-Minerals (MULTIVITAMIN ADULT PO) Take 1 tablet by mouth daily.     Nebulizers (PORTABLE COMPRESSOR NEBULIZER) MISC Use as directed 1 each PRN   OVER THE COUNTER MEDICATION Calcium     OVER THE COUNTER MEDICATION Omega 3 2100     Probiotic Product (PROBIOTIC DAILY PO) Take 1 tablet by mouth daily.     sodium chloride (OCEAN) 0.65 % SOLN nasal spray Place 1 spray into both nostrils as needed for congestion.     Current Facility-Administered Medications on File Prior to Visit  Medication Dose Route Frequency Provider Last Rate Last Admin   methylPREDNISolone acetate (DEPO-MEDROL) injection 80 mg  80 mg Intramuscular Once Young, Clinton D, MD        BP (!) 110/40   Pulse 90   Resp 18   Ht 4\' 11"   (1.499 m)   Wt 108 lb (49 kg)   LMP 06/30/2016   SpO2 99%   BMI 21.81 kg/m        Objective:   Physical Exam  General Mental Status- Alert. General Appearance- Not in acute distress.   Skin General: Color- Normal Color. Moisture- Normal Moisture.  Neck Carotid Arteries- Normal color. Moisture- Normal Moisture. No carotid bruits. No JVD.  Chest and Lung Exam Auscultation: Breath Sounds:-Normal.  Cardiovascular Auscultation:Rythm- Regular. Murmurs & Other Heart Sounds:Auscultation of the heart reveals- No Murmurs.  Abdomen Inspection:-Inspeection Normal. Palpation/Percussion:Note:No mass. Palpation and Percussion of the abdomen reveal- Non Tender, Non Distended + BS, no rebound or guarding.   Neurologic Cranial Nerve exam:- CN III-XII intact(No nystagmus), symmetric smile. Strength:- 5/5 equal and symmetric strength both upper and lower extremities.   Skin- under  left breast faint red rash. No vesicles. Seen.  Anterior thorax- mild chest wall pain on papation upper portion.     Assessment & Plan:   Patient Instructions  1. Chest pain, unspecified type Nsr. Pain is described much like msk/costochondriits type pan. You express desire for reasurrance. - EKG 12-Lead -troponin for caution sake.  If in the event pain changes to constant and worse be seen in ED.  2. Costochondritis Can't tolerate nsaids per your report. So rx medrol dose pack. This can help with cartlidge pain and wheezing  3. Bronchitis Keflex antibiotic. Also get chest xray. -benzonatate for cough -Azithromycin antibiotic.  4. Wheezing Rx symbicort.  5. Faint rash under left  breast- no vesicles seen Rx nystatin.     Follow up 7-10 days or sooner if needed.  .mec

## 2022-10-14 NOTE — Patient Instructions (Addendum)
1. Chest pain, unspecified type Nsr. Pain is described much like msk/costochondriits type pan. You express desire for reasurrance. - EKG 12-Lead -troponin for caution sake.  If in the event pain changes to constant and worse be seen in ED.  2. Costochondritis Can't tolerate nsaids per your report. So rx medrol dose pack. This can help with cartlidge pain and wheezing  3. Bronchitis Also get chest xray. -benzonatate for cough -Azithromycin antibiotic.  4. Wheezing Rx symbicort.  5. Faint rash under left breast- no vesicles seen Rx nystatin.     Follow up 7-10 days or sooner if needed.

## 2022-10-14 NOTE — Progress Notes (Deleted)
HPI: FU CP and syncope.  ABIs May 2017 normal.  Monitor October 2017 showed sinus rhythm.  Also seen for syncopal episode in October 2017.  MRI negative.  EEG normal.  Seen by neurology and felt to have postconcussive syndrome. Exercise treadmill October 2018 was negative. Echocardiogram March 2019 showed normal LV systolic function.  Calcium score January 2022 0.  Renal Dopplers September 2022 showed no renal artery stenosis.  Lower extremity venous Dopplers October 2022 showed no DVT. Since last seen,   Current Outpatient Medications  Medication Sig Dispense Refill   albuterol (VENTOLIN HFA) 108 (90 Base) MCG/ACT inhaler Inhale 2 puffs every 6 hours if needed 8 g 6   Multiple Vitamins-Minerals (MULTIVITAMIN ADULT PO) Take 1 tablet by mouth daily.     Nebulizers (PORTABLE COMPRESSOR NEBULIZER) MISC Use as directed 1 each PRN   OVER THE COUNTER MEDICATION Calcium     OVER THE COUNTER MEDICATION Omega 3 2100     Probiotic Product (PROBIOTIC DAILY PO) Take 1 tablet by mouth daily.     sodium chloride (OCEAN) 0.65 % SOLN nasal spray Place 1 spray into both nostrils as needed for congestion.     Current Facility-Administered Medications  Medication Dose Route Frequency Provider Last Rate Last Admin   methylPREDNISolone acetate (DEPO-MEDROL) injection 80 mg  80 mg Intramuscular Once Jetty Duhamel D, MD         Past Medical History:  Diagnosis Date   Allergic rhinitis    Anemia    Anxiety    Asthma    Contact dermatitis and eczema    Diverticulitis    CT Scan   Esophageal reflux    H. pylori infection    Hyperlipidemia, mixed    IBS (irritable bowel syndrome)    IC (interstitial cystitis)    Internal hemorrhoids    Lumbago    Mitral valve disorders(424.0)    Osteoarthritis 07/08/2015   Post concussive syndrome    Seasonal and perennial allergic rhinitis 01/20/2015    Past Surgical History:  Procedure Laterality Date   BARTHOLIN CYST MARSUPIALIZATION N/A 12/18/2013    Procedure: BARTHOLIN CYST MARSUPIALIZATION WITH BIOPSY;  Surgeon: Lenoard Aden, MD;  Location: [facility];  Service: Gynecology;  Laterality: N/A;   CESAREAN SECTION     DILITATION & CURRETTAGE/HYSTROSCOPY WITH NOVASURE ABLATION N/A 10/14/2012   Procedure: DILATATION & CURETTAGE/HYSTEROSCOPY WITH NOVASURE ABLATION;  Surgeon: Lenoard Aden, MD;  Location: [facility];  Service: Gynecology;  Laterality: N/A;   MOUTH SURGERY     SINUS SURGERY WITH INSTATRAK      Social History   Socioeconomic History   Marital status: Married    Spouse name: Not on file   Number of children: 2   Years of education: Masters   Highest education level: Master's degree (e.g., MA, MS, MEng, MEd, MSW, MBA)  Occupational History   Occupation: Clinical cytogeneticist  Tobacco Use   Smoking status: Never   Smokeless tobacco: Never  Vaping Use   Vaping Use: Never used  Substance and Sexual Activity   Alcohol use: Not Currently   Drug use: No   Sexual activity: Yes    Comment: lives with husband and son, 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   Right-handed.   1 cup caffeine daily.   Social Determinants of Health  Financial Resource Strain: Low Risk  (10/13/2022)   Overall Financial Resource Strain (CARDIA)    Difficulty of Paying Living Expenses: Not very hard  Food Insecurity: Patient Declined (10/13/2022)   Hunger Vital Sign    Worried About Running Out of Food in the Last Year: Patient declined    Ran Out of Food in the Last Year: Patient declined  Transportation Needs: Unknown (10/13/2022)   PRAPARE - Administrator, Civil Service (Medical): No    Lack of Transportation (Non-Medical): Not on file  Physical Activity: Sufficiently Active (10/13/2022)   Exercise Vital Sign    Days of Exercise per Week: 5 days    Minutes of Exercise per Session: 60 min  Stress: No  Stress Concern Present (10/13/2022)   [employer] of Occupational Health - Occupational Stress Questionnaire    Feeling of Stress : Only a little  Social Connections: Unknown (10/13/2022)   Social Connection and Isolation Panel [NHANES]    Frequency of Communication with Friends and Family: Three times a week    Frequency of Social Gatherings with Friends and Family: Patient declined    Attends Religious Services: Patient declined    Database administrator or Organizations: No    Attends Engineer, structural: Not on file    Marital Status: Patient declined  Catering manager Violence: Not on file    Family History  Problem Relation Age of Onset   Diabetes Mother    Hypertension Mother    Heart attack Mother        MI at age 97   Alcohol abuse Mother        smoker   Heart disease Mother    Dementia Mother    Hypertension Father    Hyperlipidemia Father    Heart attack Father    Lung cancer Father        smoking hx; dx > 50   Colon polyps Sister    Mental illness Sister        anxiety from 9/11 survivors   Varicose Veins Sister    Osteoporosis Sister    Other Sister        hypoglycemia   Varicose Veins Sister    Rheum arthritis Sister        lupus   Pleurisy Sister    Fibromyalgia Sister    Mental retardation Sister        depression   Leukemia Brother        acute; dx 69s   GER disease Son    Colon cancer Paternal Uncle        dx unknown age   Dementia Maternal Grandmother    Heart disease Maternal Grandfather        mi   Cancer Paternal Grandmother        breast/ovairan before age 68   Alcohol abuse Other        Family history   Breast cancer Neg Hx     ROS: no fevers or chills, productive cough, hemoptysis, dysphasia, odynophagia, melena, hematochezia, dysuria, hematuria, rash, seizure activity, orthopnea, PND, pedal edema, claudication. Remaining systems are negative.  Physical Exam: Well-developed well-nourished in no acute distress.  Skin  is warm and dry.  HEENT is normal.  Neck is supple.  Chest is clear to auscultation with normal expansion.  Cardiovascular exam is regular rate and rhythm.  Abdominal exam nontender or distended. No masses palpated. Extremities show no edema. neuro grossly intact  ECG- personally reviewed  A/P  1 history of syncope-patient has had no recurrent episodes since last office visit.  Previous episodes felt likely to be vagal in etiology.  2 chest pain-  3 hyperlipidemia-  4 question history of mitral valve prolapse-this is not evident on most recent echocardiogram.  Olga Millers, MD

## 2022-10-15 ENCOUNTER — Telehealth: Payer: Self-pay | Admitting: Medical

## 2022-10-15 MED ORDER — METHYLPREDNISOLONE 4 MG PO TABS: ORAL_TABLET | ORAL | 0 refills | Status: DC

## 2022-10-15 NOTE — Addendum Note (Signed)
Addended by: Gwenevere Abbot on: 10/15/2022 12:03 PM   Modules accepted: Orders

## 2022-10-15 NOTE — Telephone Encounter (Signed)
Pt just wanted to make [facility] aware she will not be taking the inhaler prescribed. Just fyi.

## 2022-10-15 NOTE — Telephone Encounter (Signed)
Patient called and was seen yesterday and went to pharmacy to get the low dose of prednisone that was spoken of and was not on file. Please send to [facility]

## 2022-10-15 NOTE — Telephone Encounter (Signed)
Patient called back and would like the singular 4 mg low dose. Please call to advise

## 2022-10-21 ENCOUNTER — Ambulatory Visit: Payer: 59 | Admitting: Cardiology

## 2022-10-21 ENCOUNTER — Telehealth: Payer: Self-pay | Admitting: Cardiology

## 2022-10-21 NOTE — Telephone Encounter (Signed)
Patient is requesting an order for an echo. She states if she has an echo, then her appointment with Dr. Jens Som will no longer be needed.

## 2022-10-21 NOTE — Telephone Encounter (Signed)
Spoke to patient .  She states  she would like a ne cho ordered. Due to she had an episode chest pressure . - she went to [facility] 10/14/22.  Patient would like Dr Jens Som to review office visit.  RN attempted to inform the patient this would not the appropriate . Test, but patient is adamant  she wanted to see if Dr Jens Som ,could order echo - prior to follow up visit.  Patient states she did not want miss any more work  Patient states she missed her appointment earlier today ,it was error somehow?   RN informed patient will defer to Dr Jens Som

## 2022-10-22 NOTE — Telephone Encounter (Signed)
Aware of dr crenshaw's recommendations.  

## 2022-11-10 NOTE — Progress Notes (Signed)
HPI: FU CP and syncope.  ABIs May 2017 normal.  Monitor October 2017 showed sinus rhythm.  Also seen for syncopal episode in October 2017.  MRI negative.  EEG normal.  Seen by neurology and felt to have postconcussive syndrome. Exercise treadmill October 2018 was negative. Echocardiogram March 2019 showed normal LV systolic function. Calcium score January 2022 0.  Renal Dopplers September 2022 showed no renal artery stenosis.  Lower extremity venous Dopplers October 2022 showed no DVT. Since last seen, patient had an episode of chest pressure in June.  Troponin was normal and ECG showed no ST changes.  However she does not have dyspnea on exertion, orthopnea, PND, pedal edema, recurrent syncope or exertional chest pain.  Current Outpatient Medications  Medication Sig Dispense Refill   Multiple Vitamins-Minerals (MULTIVITAMIN ADULT PO) Take 1 tablet by mouth daily.     OVER THE COUNTER MEDICATION Calcium     OVER THE COUNTER MEDICATION Omega 3 2100     Probiotic Product (PROBIOTIC DAILY PO) Take 1 tablet by mouth daily.     sodium chloride (OCEAN) 0.65 % SOLN nasal spray Place 1 spray into both nostrils as needed for congestion.     albuterol (VENTOLIN HFA) 108 (90 Base) MCG/ACT inhaler Inhale 2 puffs every 6 hours if needed 8 g 6   benzonatate (TESSALON) 100 MG capsule Take 1 capsule (100 mg total) by mouth 3 (three) times daily as needed for cough. 30 capsule 0   budesonide-formoterol (SYMBICORT) 160-4.5 MCG/ACT inhaler Inhale 2 puffs into the lungs 2 (two) times daily. 1 each 0   methylPREDNISolone (MEDROL) 4 MG tablet Standard 6 day taper course. 21 tablet 0   Nebulizers (PORTABLE COMPRESSOR NEBULIZER) MISC Use as directed 1 each PRN   nystatin cream (MYCOSTATIN) Apply 1 Application topically 2 (two) times daily. 30 g 0   Current Facility-Administered Medications  Medication Dose Route Frequency Provider Last Rate Last Admin   methylPREDNISolone acetate (DEPO-MEDROL) injection 80 mg  80  mg Intramuscular Once Jetty Duhamel D, MD         Past Medical History:  Diagnosis Date   Allergic rhinitis    Anemia    Anxiety    Asthma    Contact dermatitis and eczema    Diverticulitis    CT Scan   Esophageal reflux    H. pylori infection    Hyperlipidemia, mixed    IBS (irritable bowel syndrome)    IC (interstitial cystitis)    Internal hemorrhoids    Lumbago    Mitral valve disorders(424.0)    Osteoarthritis 07/08/2015   Post concussive syndrome    Seasonal and perennial allergic rhinitis 01/20/2015    Past Surgical History:  Procedure Laterality Date   BARTHOLIN CYST MARSUPIALIZATION N/A 12/18/2013   Procedure: BARTHOLIN CYST MARSUPIALIZATION WITH BIOPSY;  Surgeon: Lenoard Aden, MD;  Location: [facility];  Service: Gynecology;  Laterality: N/A;   CESAREAN SECTION     DILITATION & CURRETTAGE/HYSTROSCOPY WITH NOVASURE ABLATION N/A 10/14/2012   Procedure: DILATATION & CURETTAGE/HYSTEROSCOPY WITH NOVASURE ABLATION;  Surgeon: Lenoard Aden, MD;  Location: [facility];  Service: Gynecology;  Laterality: N/A;   MOUTH SURGERY     SINUS SURGERY WITH INSTATRAK      Social History   Socioeconomic History   Marital status: Married    Spouse name: Not on file   Number of children: 2   Years of education: Masters   Highest education level: Master's degree (e.g., MA, MS, MEng, MEd,  MSW, MBA)  Occupational History   Occupation: Clinical cytogeneticist  Tobacco Use   Smoking status: Never   Smokeless tobacco: Never  Vaping Use   Vaping status: Never Used  Substance and Sexual Activity   Alcohol use: Not Currently   Drug use: No   Sexual activity: Yes    Comment: lives with husband and son, 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   Right-handed.   1 cup caffeine daily.   Social Determinants of Health   Financial Resource Strain: Low Risk   (10/13/2022)   Overall Financial Resource Strain (CARDIA)    Difficulty of Paying Living Expenses: Not very hard  Food Insecurity: Patient Declined (10/13/2022)   Hunger Vital Sign    Worried About Running Out of Food in the Last Year: Patient declined    Ran Out of Food in the Last Year: Patient declined  Transportation Needs: Unknown (10/13/2022)   PRAPARE - Administrator, Civil Service (Medical): No    Lack of Transportation (Non-Medical): Not on file  Physical Activity: Sufficiently Active (10/13/2022)   Exercise Vital Sign    Days of Exercise per Week: 5 days    Minutes of Exercise per Session: 60 min  Stress: No Stress Concern Present (10/13/2022)   [employer] of Occupational Health - Occupational Stress Questionnaire    Feeling of Stress : Only a little  Social Connections: Unknown (10/13/2022)   Social Connection and Isolation Panel [NHANES]    Frequency of Communication with Friends and Family: Three times a week    Frequency of Social Gatherings with Friends and Family: Patient declined    Attends Religious Services: Patient declined    Database administrator or Organizations: No    Attends Engineer, structural: Not on file    Marital Status: Patient declined  Catering manager Violence: Not on file    Family History  Problem Relation Age of Onset   Diabetes Mother    Hypertension Mother    Heart attack Mother        MI at age 61   Alcohol abuse Mother        smoker   Heart disease Mother    Dementia Mother    Hypertension Father    Hyperlipidemia Father    Heart attack Father    Lung cancer Father        smoking hx; dx > 50   Colon polyps Sister    Mental illness Sister        anxiety from 9/11 survivors   Varicose Veins Sister    Osteoporosis Sister    Other Sister        hypoglycemia   Varicose Veins Sister    Rheum arthritis Sister        lupus   Pleurisy Sister    Fibromyalgia Sister    Mental retardation Sister         depression   Leukemia Brother        acute; dx 44s   GER disease Son    Colon cancer Paternal Uncle        dx unknown age   Dementia Maternal Grandmother    Heart disease Maternal Grandfather        mi   Cancer Paternal Grandmother        breast/ovairan before  age 1   Alcohol abuse Other        Family history   Breast cancer Neg Hx     ROS: no fevers or chills, productive cough, hemoptysis, dysphasia, odynophagia, melena, hematochezia, dysuria, hematuria, rash, seizure activity, orthopnea, PND, pedal edema, claudication. Remaining systems are negative.  Physical Exam: Well-developed well-nourished in no acute distress.  Skin is warm and dry.  HEENT is normal.  Neck is supple.  Chest is clear to auscultation with normal expansion.  Cardiovascular exam is regular rate and rhythm.  Abdominal exam nontender or distended. No masses palpated. Extremities show no edema. neuro grossly intact  ECG-October 14, 2022-normal sinus rhythm with no ST changes.  Personally reviewed  A/P  1 prior history of syncope-no recurrences since last office visit.  Previous episode felt to be vagal in etiology.  Continue hydration and increase sodium intake.  2 prior history of chest pain-patient denies recent symptoms.  Previous calcium score 0.  3 hyperlipidemia-patient does not have vascular disease that has been documented.  Will continue diet.  4 history of mitral valve prolapse-this was not evident on her most recent echocardiogram.  Olga Millers, MD

## 2022-11-18 ENCOUNTER — Ambulatory Visit: Payer: 59 | Admitting: Cardiology

## 2022-11-18 ENCOUNTER — Encounter: Payer: Self-pay | Admitting: Cardiology

## 2022-11-18 VITALS — BP 122/78 | HR 85 | Ht 59.0 in | Wt 110.1 lb

## 2022-11-18 DIAGNOSIS — I059 Rheumatic mitral valve disease, unspecified: Secondary | ICD-10-CM

## 2022-11-18 DIAGNOSIS — R55 Syncope and collapse: Secondary | ICD-10-CM | POA: Diagnosis not present

## 2022-11-18 DIAGNOSIS — E785 Hyperlipidemia, unspecified: Secondary | ICD-10-CM | POA: Diagnosis not present

## 2022-11-18 DIAGNOSIS — R072 Precordial pain: Secondary | ICD-10-CM

## 2022-11-18 NOTE — Patient Instructions (Signed)
  Follow-Up: At [facility], you and your health needs are our priority.  As part of our continuing mission to provide you with exceptional heart care, we have created designated Provider Care Teams.  These Care Teams include your primary Cardiologist (physician) and Advanced Practice Providers (APPs -  Physician Assistants and Nurse Practitioners) who all work together to provide you with the care you need, when you need it.  We recommend signing up for the patient portal called "MyChart".  Sign up information is provided on this After Visit Summary.  MyChart is used to connect with patients for Virtual Visits (Telemedicine).  Patients are able to view lab/test results, encounter notes, upcoming appointments, etc.  Non-urgent messages can be sent to your provider as well.   To learn more about what you can do with MyChart, go to [email or web address].    Your next appointment:   12 month(s)  Provider:   Brian Crenshaw MD    

## 2022-11-22 NOTE — Progress Notes (Unsigned)
Office Visit Note  Patient: [name]             Date of Birth: 19-Jul-1965           MRN: [id number]             PCP: Bradd Canary, MD Referring: Bradd Canary, MD Visit Date: 11/24/2022 Occupation: @GUAROCC @  Subjective:  No chief complaint on file.   History of Present Illness: [name] is a 57 y.o. female ***     Activities of Daily Living:  Patient reports morning stiffness for *** {minute/hour:19697}.   Patient {ACTIONS;DENIES/REPORTS:21021675::"Denies"} nocturnal pain.  Difficulty dressing/grooming: {ACTIONS;DENIES/REPORTS:21021675::"Denies"} Difficulty climbing stairs: {ACTIONS;DENIES/REPORTS:21021675::"Denies"} Difficulty getting out of chair: {ACTIONS;DENIES/REPORTS:21021675::"Denies"} Difficulty using hands for taps, buttons, cutlery, and/or writing: {ACTIONS;DENIES/REPORTS:21021675::"Denies"}  No Rheumatology ROS completed.   PMFS History:  Patient Active Problem List   Diagnosis Date Noted   Arm pain, right 07/14/2022   Dry eyes, bilateral 07/14/2022   Left flank pain, chronic 07/14/2022   Snoring 04/07/2022   Closed fracture of phalanx of right great toe 11/19/2021   Raynaud disease 08/26/2021   renal insufficiency 01/07/2021   Preventative health care 11/26/2020   Varicose veins of both lower extremities 10/28/2020   Hemorrhoids 10/28/2020   Hyperglycemia 04/01/2020   Nonintractable headache 12/31/2019   Right leg pain 11/19/2019   Constipation 11/17/2019   Neck pain 09/25/2019   Educated about COVID-19 virus infection 06/21/2019   Insomnia 02/19/2019   Anxiety and depression 01/13/2019   Perimenopausal 01/02/2018   History of Helicobacter pylori infection 12/28/2017   Nocturia 07/19/2016   Rash and nonspecific skin eruption 03/21/2016   Post concussive syndrome 01/22/2016   Osteoarthritis 07/08/2015   Hematuria 01/20/2015   Hyperlipidemia, mild 01/20/2015   Seasonal and perennial allergic rhinitis 01/20/2015   Fatigue  10/24/2014   Sinusitis, acute maxillary 10/11/2014   Arthralgia 08/20/2013   Diverticulosis 07/01/2011   Menopausal state 03/25/2011   Fibroids 10/24/2010   Anemia 03/25/2010   IRRITABLE BOWEL SYNDROME 03/25/2010   Mitral valve disorder 07/09/2007   Allergic asthma, mild intermittent, uncomplicated 07/09/2007   ESOPHAGEAL REFLUX 07/09/2007   LOW BACK PAIN 12/14/2006    Past Medical History:  Diagnosis Date   Allergic rhinitis    Anemia    Anxiety    Asthma    Contact dermatitis and eczema    Diverticulitis    CT Scan   Esophageal reflux    H. pylori infection    Hyperlipidemia, mixed    IBS (irritable bowel syndrome)    IC (interstitial cystitis)    Internal hemorrhoids    Lumbago    Mitral valve disorders(424.0)    Osteoarthritis 07/08/2015   Post concussive syndrome    Seasonal and perennial allergic rhinitis 01/20/2015    Family History  Problem Relation Age of Onset   Diabetes Mother    Hypertension Mother    Heart attack Mother        MI at age 38   Alcohol abuse Mother        smoker   Heart disease Mother    Dementia Mother    Hypertension Father    Hyperlipidemia Father    Heart attack Father    Lung cancer Father        smoking hx; dx > 50   Colon polyps Sister    Mental illness Sister        anxiety from 9/11 survivors   Varicose Veins Sister    Osteoporosis Sister  Other Sister        hypoglycemia   Varicose Veins Sister    Rheum arthritis Sister        lupus   Pleurisy Sister    Fibromyalgia Sister    Mental retardation Sister        depression   Leukemia Brother        acute; dx 4s   GER disease Son    Colon cancer Paternal Uncle        dx unknown age   Dementia Maternal Grandmother    Heart disease Maternal Grandfather        mi   Cancer Paternal Grandmother        breast/ovairan before age 80   Alcohol abuse Other        Family history   Breast cancer Neg Hx    Past Surgical History:  Procedure Laterality Date   BARTHOLIN  CYST MARSUPIALIZATION N/A 12/18/2013   Procedure: BARTHOLIN CYST MARSUPIALIZATION WITH BIOPSY;  Surgeon: Lenoard Aden, MD;  Location: [facility];  Service: Gynecology;  Laterality: N/A;   CESAREAN SECTION     DILITATION & CURRETTAGE/HYSTROSCOPY WITH NOVASURE ABLATION N/A 10/14/2012   Procedure: DILATATION & CURETTAGE/HYSTEROSCOPY WITH NOVASURE ABLATION;  Surgeon: Lenoard Aden, MD;  Location: [facility];  Service: Gynecology;  Laterality: N/A;   MOUTH SURGERY     SINUS SURGERY WITH INSTATRAK     Social History   Social History Narrative   Lives at home home with husband and son.   Right-handed.   1 cup caffeine daily.   Immunization History  Administered Date(s) Administered   COVID-19, mRNA, vaccine(Comirnaty)12 years and older 03/08/2022   Influenza Split 01/26/2011, 01/14/2012, 12/30/2012   Influenza Whole 01/25/2001, 01/24/2010   Influenza,inj,Quad PF,6+ Mos 01/03/2014, 01/08/2015, 01/22/2016, 12/24/2016, 12/28/2017, 12/23/2018, 01/06/2021, 01/23/2022   PFIZER(Purple Top)SARS-COV-2 Vaccination 05/22/2019, 06/12/2019, 02/02/2020   Pfizer Covid-19 Vaccine Bivalent Booster 4yrs & up 03/07/2021   Td 04/27/1998, 12/30/2007   Tdap 01/13/2018   Zoster Recombinant(Shingrix) 03/11/2019, 09/27/2019     Objective: Vital Signs: LMP 06/30/2016    Physical Exam   Musculoskeletal Exam: ***  CDAI Exam: CDAI Score: -- Patient Global: --; Provider Global: -- Swollen: --; Tender: -- Joint Exam 11/24/2022   No joint exam has been documented for this visit   There is currently no information documented on the homunculus. Go to the Rheumatology activity and complete the homunculus joint exam.  Investigation: No additional findings.  Imaging: No results found.  Recent Labs: Lab Results  Component Value Date   WBC 4.1 07/14/2022   HGB 13.3 07/14/2022   PLT 280.0 07/14/2022   NA 139 07/14/2022   K 3.8 07/14/2022   CL 103 07/14/2022   CO2 29 07/14/2022   GLUCOSE 96 07/14/2022    BUN 9 07/14/2022   CREATININE 0.55 07/14/2022   BILITOT 0.6 07/14/2022   ALKPHOS 99 07/14/2022   AST 16 07/14/2022   ALT 17 07/14/2022   PROT 7.2 07/14/2022   ALBUMIN 4.4 07/14/2022   CALCIUM 9.4 07/14/2022   GFRAA >60 01/09/2019   QFTBGOLDPLUS NEGATIVE 10/22/2020    Speciality Comments: No specialty comments available.  Procedures:  No procedures performed Allergies: Amoxicillin, Aspirin, Codeine, Latex, Other, Sulfa antibiotics, Sulfamethoxazole-trimethoprim, Sulfonamide derivatives, Sulphadimidine sodium [sulfamethazine sodium], Ciprofloxacin, Azithromycin, Carisoprodol, Clarithromycin, Epinephrine, Fenofibrate, Metronidazole, Nsaids, Penicillins, Tolmetin, and Omeprazole   Assessment / Plan:     Visit Diagnoses: No diagnosis found.  Orders: No orders of the defined types were placed in this  encounter.  No orders of the defined types were placed in this encounter.   Face-to-face time spent with patient was *** minutes. Greater than 50% of time was spent in counseling and coordination of care.  Follow-Up Instructions: No follow-ups on file.   Fuller Plan, MD  Note - This record has been created using [employer].  Chart creation errors have been sought, but may not always  have been located. Such creation errors do not reflect on  the standard of medical care.

## 2022-11-24 ENCOUNTER — Ambulatory Visit: Payer: 59 | Attending: Internal Medicine | Admitting: Internal Medicine

## 2022-11-24 ENCOUNTER — Encounter: Payer: Self-pay | Admitting: Internal Medicine

## 2022-11-24 VITALS — BP 103/68 | HR 80 | Resp 14 | Ht 59.0 in | Wt 110.0 lb

## 2022-11-24 DIAGNOSIS — K589 Irritable bowel syndrome without diarrhea: Secondary | ICD-10-CM

## 2022-11-24 DIAGNOSIS — R768 Other specified abnormal immunological findings in serum: Secondary | ICD-10-CM | POA: Diagnosis not present

## 2022-11-24 DIAGNOSIS — N3010 Interstitial cystitis (chronic) without hematuria: Secondary | ICD-10-CM

## 2022-11-24 DIAGNOSIS — M67449 Ganglion, unspecified hand: Secondary | ICD-10-CM | POA: Diagnosis not present

## 2022-11-24 NOTE — Patient Instructions (Addendum)
I do not see any major red flags for a systemic autoimmune problem but we will check additional markers for this today.  Bone density shows osteopenia best steps for this are to continued regular weight bearing exercise and supplement vitamin D.   You have several finger nodules consistent with digital mucinous cysts. These are benign nodules coming from finger joint swelling due to osteoarthritis.    For osteoarthritis several treatments may be beneficial:  - Topical antiinflammatory medicine such as diclofenac or Voltaren can be applied to  affected area as needed. Topical analgesics containing CBD, menthol, or lidocaine can be tried.  - Oral nonsteroidal antiinflammatory drugs (NSAIDs) such as ibuprofen, aleve, celebrex, or mobic are usually helpful for osteoarthritis. These should be taken intermittently or as needed, and always taken with food.  - Turmeric has some antiinflammatory effect similar to NSAIDs and may help, if taken as a supplement should not be taken above recommended doses.   - Compressive gloves or sleeve can be helpful to support the joint especially if hurting or swelling with certain activities.  - Physical therapy referral can discuss exercises or activity modification to improve symptoms or strength if needed.  - Local steroid injection is an option if symptoms become worse and not controlled by the above options.

## 2022-11-25 DIAGNOSIS — M67449 Ganglion, unspecified hand: Secondary | ICD-10-CM | POA: Insufficient documentation

## 2022-12-25 ENCOUNTER — Telehealth: Payer: Self-pay | Admitting: *Deleted

## 2022-12-25 NOTE — Telephone Encounter (Signed)
[name] contacted the office and left message requesting a call back from Dr. Dimple Casey to discuss some symptoms she has going on. She states she is under stress from her job which she feels is causing her to have these symptoms.[name] would like to discuss if her symptoms may be related to Fibromyalgia. [name] states her sister has Fibromyalgia.

## 2022-12-27 DIAGNOSIS — U071 COVID-19: Secondary | ICD-10-CM

## 2022-12-27 HISTORY — DX: COVID-19: U07.1

## 2023-01-04 ENCOUNTER — Other Ambulatory Visit: Payer: Self-pay | Admitting: Medical Genetics

## 2023-01-04 DIAGNOSIS — Z006 Encounter for examination for normal comparison and control in clinical research program: Secondary | ICD-10-CM

## 2023-01-17 NOTE — Assessment & Plan Note (Signed)
Hydrate and monitor 

## 2023-01-17 NOTE — Assessment & Plan Note (Signed)
hgba1c acceptable, minimize simple carbs. Increase exercise as tolerated.  

## 2023-01-17 NOTE — Assessment & Plan Note (Signed)
Encourage heart healthy diet such as MIND or DASH diet, increase exercise, avoid trans fats, simple carbohydrates and processed foods, consider a krill or fish or flaxseed oil cap daily.  °

## 2023-01-18 ENCOUNTER — Telehealth: Payer: 59 | Admitting: Family Medicine

## 2023-01-18 ENCOUNTER — Encounter: Payer: Self-pay | Admitting: Family Medicine

## 2023-01-18 DIAGNOSIS — N186 End stage renal disease: Secondary | ICD-10-CM | POA: Diagnosis not present

## 2023-01-18 DIAGNOSIS — R739 Hyperglycemia, unspecified: Secondary | ICD-10-CM | POA: Diagnosis not present

## 2023-01-18 DIAGNOSIS — E785 Hyperlipidemia, unspecified: Secondary | ICD-10-CM

## 2023-01-18 DIAGNOSIS — R2241 Localized swelling, mass and lump, right lower limb: Secondary | ICD-10-CM

## 2023-01-18 NOTE — Progress Notes (Signed)
MyChart Video Visit    Virtual Visit via Video Note   This patient is at least at moderate risk for complications without adequate follow up. This format is felt to be most appropriate for this patient at this time. Physical exam was limited by quality of the video and audio technology used for the visit. [name], CMA was able to get the patient set up on a video visit.  Patient location: home Patient and provider in visit Provider location: Office  I discussed the limitations of evaluation and management by telemedicine and the availability of in person appointments. The patient expressed understanding and agreed to proceed.  Visit Date: 01/18/2023  Today's healthcare provider: Danise Edge, MD     Subjective:    Patient ID: [name], female    DOB: July 16, 1965, 57 y.o.   MRN: [id number]  No chief complaint on file.   HPI Discussed the use of AI scribe software for clinical note transcription with the patient, who gave verbal consent to proceed.  History of Present Illness   The patient, with a history of arthritis and sleep apnea, presents with a chief complaint of persistent twitching and pressure in the left eye. The patient reports that these symptoms tend to worsen towards the end of the day, possibly due to prolonged computer use. The patient has not recently seen an eye doctor but has used prescribed eye drops in the past for dryness.  The patient also reports a lump on the right thigh, which has been present for a couple of years. A dermatologist identified it as a lipoma and recommended an ultrasound for further evaluation. The patient has not yet scheduled this ultrasound.  The patient has a history of arthritis, with recent worsening of symptoms including stiffness and pain in the hands and knees. The patient has consulted a rheumatologist, who diagnosed osteoarthritis based on physical examination and blood work. The patient's ANA test was positive, but the  significance of this finding is unclear.  The patient also experiences headaches, which have increased in frequency compared to the past. The patient is considering acupuncture and massage therapy for symptom relief.  The patient has a history of sleep apnea and underwent testing in the past. The results were borderline, and the patient is considering retesting.  The patient has questions about the COVID-19 vaccination, particularly regarding the Novavax vaccine. The patient has received multiple COVID-19 vaccinations in the past and is considering a booster shot.  The patient is also participating in a genetic testing program and is awaiting further instructions for testing.        Past Medical History:  Diagnosis Date   Allergic rhinitis    Anemia    Anxiety    Asthma    Contact dermatitis and eczema    Diverticulitis    CT Scan   Esophageal reflux    H. pylori infection    Hyperlipidemia, mixed    IBS (irritable bowel syndrome)    IC (interstitial cystitis)    Internal hemorrhoids    Lumbago    Mitral valve disorders(424.0)    Osteoarthritis 07/08/2015   Post concussive syndrome    Seasonal and perennial allergic rhinitis 01/20/2015    Past Surgical History:  Procedure Laterality Date   BARTHOLIN CYST MARSUPIALIZATION N/A 12/18/2013   Procedure: BARTHOLIN CYST MARSUPIALIZATION WITH BIOPSY;  Surgeon: Lenoard Aden, MD;  Location: [facility];  Service: Gynecology;  Laterality: N/A;   CESAREAN SECTION     DILITATION & CURRETTAGE/HYSTROSCOPY  WITH NOVASURE ABLATION N/A 10/14/2012   Procedure: DILATATION & CURETTAGE/HYSTEROSCOPY WITH NOVASURE ABLATION;  Surgeon: Lenoard Aden, MD;  Location: [facility];  Service: Gynecology;  Laterality: N/A;   MOUTH SURGERY     SINUS SURGERY WITH INSTATRAK      Family History  Problem Relation Age of Onset   Diabetes Mother    Hypertension Mother    Heart attack Mother        MI at age 62   Alcohol abuse Mother        smoker   Heart  disease Mother    Dementia Mother    Hypertension Father    Hyperlipidemia Father    Heart attack Father    Lung cancer Father        smoking hx; dx > 50   Colon polyps Sister    Mental illness Sister        anxiety from 9/11 survivors   Varicose Veins Sister    Osteoporosis Sister    Other Sister        hypoglycemia   Varicose Veins Sister    Rheum arthritis Sister        lupus   Pleurisy Sister    Fibromyalgia Sister    Mental retardation Sister        depression   Leukemia Brother        acute; dx 80s   GER disease Son    Colon cancer Paternal Uncle        dx unknown age   Dementia Maternal Grandmother    Heart disease Maternal Grandfather        mi   Cancer Paternal Grandmother        breast/ovairan before age 89   Alcohol abuse Other        Family history   Breast cancer Neg Hx     Social History   Socioeconomic History   Marital status: Married    Spouse name: Not on file   Number of children: 2   Years of education: Masters   Highest education level: Master's degree (e.g., MA, MS, MEng, MEd, MSW, MBA)  Occupational History   Occupation: Clinical cytogeneticist  Tobacco Use   Smoking status: Never    Passive exposure: Past   Smokeless tobacco: Never  Vaping Use   Vaping status: Never Used  Substance and Sexual Activity   Alcohol use: Not Currently   Drug use: No   Sexual activity: Yes    Comment: lives with husband and son, 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   Right-handed.   1 cup caffeine daily.   Social Determinants of Health   Financial Resource Strain: Low Risk  (10/13/2022)   Overall Financial Resource Strain (CARDIA)    Difficulty of Paying Living Expenses: Not very hard  Food Insecurity: Patient Declined (10/13/2022)   Hunger Vital Sign    Worried About Running Out of Food in the Last Year: Patient declined     Ran Out of Food in the Last Year: Patient declined  Transportation Needs: Unknown (10/13/2022)   PRAPARE - Administrator, Civil Service (Medical): No    Lack of Transportation (Non-Medical): Not on file  Physical Activity: Sufficiently Active (10/13/2022)   Exercise Vital Sign    Days of Exercise per Week: 5 days  Minutes of Exercise per Session: 60 min  Stress: No Stress Concern Present (10/13/2022)   [employer] of Occupational Health - Occupational Stress Questionnaire    Feeling of Stress : Only a little  Social Connections: Unknown (10/13/2022)   Social Connection and Isolation Panel [NHANES]    Frequency of Communication with Friends and Family: Three times a week    Frequency of Social Gatherings with Friends and Family: Patient declined    Attends Religious Services: Patient declined    Database administrator or Organizations: No    Attends Engineer, structural: Not on file    Marital Status: Patient declined  Intimate Partner Violence: Not on file    Outpatient Medications Prior to Visit  Medication Sig Dispense Refill   Multiple Vitamins-Minerals (MULTIVITAMIN ADULT PO) Take 1 tablet by mouth daily.     OVER THE COUNTER MEDICATION Calcium     OVER THE COUNTER MEDICATION Omega 3 2100     Probiotic Product (PROBIOTIC DAILY PO) Take 1 tablet by mouth daily.     sodium chloride (OCEAN) 0.65 % SOLN nasal spray Place 1 spray into both nostrils as needed for congestion.     Facility-Administered Medications Prior to Visit  Medication Dose Route Frequency Provider Last Rate Last Admin   methylPREDNISolone acetate (DEPO-MEDROL) injection 80 mg  80 mg Intramuscular Once Jetty Duhamel D, MD        Allergies  Allergen Reactions   Amoxicillin Shortness Of Breath    Doesn't remember rxn   Aspirin Shortness Of Breath    "irritated stomach" Ibuprofen and Naproxen do not agree with her either   Codeine Hives, Shortness Of Breath and Other (See  Comments)    Does not take percocet or hydrocodone; tylenol only   Latex Shortness Of Breath, Itching and Other (See Comments)   Other Shortness Of Breath and Other (See Comments)    peanuts   Sulfa Antibiotics Hives and Shortness Of Breath   Sulfamethoxazole-Trimethoprim Shortness Of Breath   Sulfonamide Derivatives Shortness Of Breath and Itching   Sulphadimidine Sodium [Sulfamethazine Sodium] Shortness Of Breath    Added to food for preservative   Ciprofloxacin Other (See Comments)    REACTION: SOB, "Tightness in head" REACTION: SOB, "Tightness in head" Syncope    Azithromycin Other (See Comments)    Faintness   Carisoprodol    Clarithromycin     "Doesn't agree with me"   Epinephrine     Heart racing   Fenofibrate Other (See Comments)    Leg cramps, body aches    Metronidazole Other (See Comments)    "cannot tolerate"   Nsaids    Penicillins     Doesn't remember reaction   Tolmetin    Omeprazole Palpitations    Review of Systems  Constitutional:  Negative for fever and malaise/fatigue.  HENT:  Negative for congestion.   Eyes:  Negative for blurred vision.  Respiratory:  Negative for shortness of breath.   Cardiovascular:  Negative for chest pain, palpitations and leg swelling.  Gastrointestinal:  Negative for abdominal pain, blood in stool and nausea.  Genitourinary:  Negative for dysuria and frequency.  Musculoskeletal:  Negative for falls.  Skin:  Negative for rash.  Neurological:  Negative for dizziness, loss of consciousness and headaches.  Endo/Heme/Allergies:  Negative for environmental allergies.  Psychiatric/Behavioral:  Negative for depression. The patient is not nervous/anxious.        Objective:    Physical Exam  LMP 06/30/2016  Wt Readings from  Last 3 Encounters:  11/24/22 110 lb (49.9 kg)  11/18/22 110 lb 1.3 oz (49.9 kg)  10/14/22 108 lb (49 kg)       Assessment & Plan:  Hyperglycemia Assessment & Plan: hgba1c acceptable, minimize  simple carbs. Increase exercise as tolerated.    Hyperlipidemia, mild Assessment & Plan: Encourage heart healthy diet such as MIND or DASH diet, increase exercise, avoid trans fats, simple carbohydrates and processed foods, consider a krill or fish or flaxseed oil cap daily.    renal insufficiency Assessment & Plan: Hydrate and monitor    Subcutaneous mass of right lower leg -     US SOFT TISSUE LOWER EXTREMITY LIMITED RIGHT (NON-VASCULAR); Future     Assessment and Plan    Eye discomfort and twitching Likely related to screen fatigue and dryness. Previous use of Refresh gel drops for dryness. -Schedule appointment with eye doctor for evaluation. -Consider using Systane or Refresh Gold version eye drops with fatty acids for longer-lasting relief.  Stress and Anxiety Utilizing Employee Assistance Program (EAP) for therapy sessions. Engaging in regular exercise at [facility]. -Continue with therapy sessions and regular exercise for stress management.  Genetic Testing Interest in [facility] testing. -Check back in mid-October for potential scheduling of nasal swab for genetic testing.  Positive ANA Unclear significance, possibly borderline. No symptoms suggestive of lupus. -Consider repeating ANA during annual physical in 2025.  Osteoarthritis Noted in hands, associated with stiffness and pain in various joints. Using natural remedies like tart cherry and turmeric. -Continue natural remedies. -Consider use of topical lidocaine gel or CBD cream for localized pain relief.  Sleep Apnea Previous borderline sleep apnea test. -Consider repeat sleep apnea test through pulmonology.  Skin Concerns Monitoring mole on foot with dermatologist. Lipoma on right upper leg. -Continue monitoring with dermatologist.  COVID-19 Vaccination Received Novavax vaccine previously. Interest in booster shot. -Plan to receive COVID-19 booster shot before the  holidays.  Follow-up Schedule annual physical for 2025.         I discussed the assessment and treatment plan with the patient. The patient was provided an opportunity to ask questions and all were answered. The patient agreed with the plan and demonstrated an understanding of the instructions.   The patient was advised to call back or seek an in-person evaluation if the symptoms worsen or if the condition fails to improve as anticipated.  Danise Edge, MD [facility] at [facility] [phone] (phone) [phone] (fax)  [facility]

## 2023-01-19 ENCOUNTER — Telehealth: Payer: Self-pay | Admitting: Family Medicine

## 2023-01-19 ENCOUNTER — Telehealth: Payer: Self-pay

## 2023-01-19 NOTE — Telephone Encounter (Signed)
Called pt letter was mailed and appt scheduled for CPE 6 months.

## 2023-01-19 NOTE — Telephone Encounter (Signed)
Pt called & wanted to request for Dr. Abner Greenspan to mail her a letter for her health insurance and job that states she had a physical this year. She prefers for the letter to be mailed to her address on file. Please advise pt.

## 2023-01-19 NOTE — Telephone Encounter (Signed)
Done

## 2023-01-22 ENCOUNTER — Encounter: Payer: Self-pay | Admitting: Physician Assistant

## 2023-01-22 ENCOUNTER — Ambulatory Visit: Payer: 59 | Admitting: Physician Assistant

## 2023-01-22 ENCOUNTER — Telehealth: Payer: Self-pay | Admitting: Family Medicine

## 2023-01-22 VITALS — BP 126/80 | HR 73 | Temp 98.2°F | Resp 16 | Ht 59.0 in | Wt 109.5 lb

## 2023-01-22 DIAGNOSIS — U071 COVID-19: Secondary | ICD-10-CM | POA: Diagnosis not present

## 2023-01-22 LAB — POC COVID19 BINAXNOW: SARS Coronavirus 2 Ag: POSITIVE — AB
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
## 2023-01-22 NOTE — Telephone Encounter (Signed)
Pt states she has some congestion accompanied with a sharp pain on the side of her head. States this pain is a 6-7/ 10. Scheduled for Tuesday with Ramon Dredge and transferred to triage.

## 2023-01-22 NOTE — Telephone Encounter (Signed)
Patient scheduled today at 3 to see Alfredia Ferguson PA.

## 2023-01-22 NOTE — Telephone Encounter (Signed)
Pt today with [name]

## 2023-01-22 NOTE — Telephone Encounter (Signed)
Initial Comment Caller states she has a stabbing pain on the right side of her head. She has congestion. Her watch tells her her blood pressure is going up. She checked it and it was normal. Translation No Nurse Assessment Nurse: Tennis Ship, RN, Clarisse Gouge Date/Time Lamount Cohen Time): 01/22/2023 2:26:44 PM Confirm and document reason for call. If symptomatic, describe symptoms. ---Caller states congestion, apple watch states bp is higher, but it is normal, had a stabbing pain to head a few days ago. 98/70, HR-100. Does the patient have any new or worsening symptoms? ---Yes Will a triage be completed? ---Yes Related visit to physician within the last 2 weeks? ---No Does the PT have any chronic conditions? (i.e. diabetes, asthma, this includes High risk factors for pregnancy, etc.) ---Yes List chronic conditions. ---mucinex Is this a behavioral health or substance abuse call? ---No Guidelines Guideline Title Affirmed Question Affirmed Notes Nurse Date/Time (Eastern Time) Cough - Acute NonProductive [1] MILD difficulty breathing (e.g., minimal/no SOB at rest, SOB with walking, pulse <100) AND [2] still present when not coughing Lorenza Chick 01/22/2023 2:31:39 PM PLEASE NOTE: All timestamps contained within this report are represented as [place] Standard Time. CONFIDENTIALTY NOTICE: This fax transmission is intended only for the addressee. It contains information that is legally privileged, confidential or otherwise protected from use or disclosure. If you are not the intended recipient, you are strictly prohibited from reviewing, disclosing, copying using or disseminating any of this information or taking any action in reliance on or regarding this information. If you have received this fax in error, please notify us immediately by telephone so that we can arrange for its return to [place]. Phone: [phone], Toll-Free: [phone], Fax: [phone] Page: 2 of 2 Call Id: [id number] Disp.  Time Lamount Cohen Time) Disposition Final User 01/22/2023 2:35:10 PM See HCP within 4 Hours (or PCP triage) Yes Tennis Ship, RN, Clarisse Gouge Final Disposition 01/22/2023 2:35:10 PM See HCP within 4 Hours (or PCP triage) Yes Tennis Ship, RN, Thayer [place] Disagree/Comply Comply Caller Understands Yes PreDisposition Call Doctor Care Advice Given Per Guideline SEE HCP (OR PCP TRIAGE) WITHIN 4 HOURS: * IF OFFICE WILL BE OPEN: You need to be seen within the next 3 or 4 hours. Call your doctor (or NP/PA) now or as soon as the office opens. CALL BACK IF: * You become worse CARE ADVICE given per Cough - Acute Non-Productive (Adult) guideline. Comments User: Shanon Rosser, RN Date/Time Lamount Cohen Time): 01/22/2023 2:39:52 PM Caller warm transferred to office for appointment. Referrals REFERRED TO PCP OFFICE

## 2023-01-23 ENCOUNTER — Ambulatory Visit (HOSPITAL_BASED_OUTPATIENT_CLINIC_OR_DEPARTMENT_OTHER)
Admission: RE | Admit: 2023-01-23 | Discharge: 2023-01-23 | Disposition: A | Discharge status: Home / Self Care | Payer: 59 | Source: Ambulatory Visit | Attending: Family Medicine | Admitting: Family Medicine

## 2023-01-23 DIAGNOSIS — R2241 Localized swelling, mass and lump, right lower limb: Secondary | ICD-10-CM | POA: Insufficient documentation

## 2023-01-26 ENCOUNTER — Ambulatory Visit (HOSPITAL_BASED_OUTPATIENT_CLINIC_OR_DEPARTMENT_OTHER)
Admission: RE | Admit: 2023-01-26 | Discharge: 2023-01-26 | Disposition: A | Discharge status: Home / Self Care | Payer: 59 | Source: Ambulatory Visit | Attending: Medical | Admitting: Medical

## 2023-01-26 ENCOUNTER — Telehealth: Payer: Self-pay | Admitting: Internal Medicine

## 2023-01-26 ENCOUNTER — Telehealth: Payer: Self-pay | Admitting: Cardiology

## 2023-01-26 ENCOUNTER — Ambulatory Visit: Payer: 59 | Admitting: Medical

## 2023-01-26 VITALS — BP 118/80 | HR 91 | Temp 98.3°F | Resp 18 | Ht 59.0 in | Wt 108.3 lb

## 2023-01-26 DIAGNOSIS — Z8616 Personal history of COVID-19: Secondary | ICD-10-CM | POA: Insufficient documentation

## 2023-01-26 DIAGNOSIS — R059 Cough, unspecified: Secondary | ICD-10-CM | POA: Diagnosis present

## 2023-01-26 DIAGNOSIS — J3489 Other specified disorders of nose and nasal sinuses: Secondary | ICD-10-CM | POA: Diagnosis not present

## 2023-01-26 DIAGNOSIS — Z8709 Personal history of other diseases of the respiratory system: Secondary | ICD-10-CM

## 2023-01-26 MED ORDER — FLUTICASONE PROPIONATE 50 MCG/ACT NA SUSP: 2.0000 | Freq: Every day | NASAL | 1 refills | Status: DC

## 2023-01-26 MED ORDER — BENZONATATE 100 MG PO CAPS: 100.0000 mg | ORAL_CAPSULE | Freq: Three times a day (TID) | ORAL | 0 refills | Status: DC | PRN

## 2023-01-26 MED ORDER — DOXYCYCLINE HYCLATE 100 MG PO TABS
100.0000 mg | ORAL_TABLET | Freq: Two times a day (BID) | ORAL | 0 refills | Status: DC
Start: 2023-01-26 — End: 2023-03-01

## 2023-01-26 NOTE — Telephone Encounter (Signed)
STAT if HR is under 50 or over 120 (normal HR is 60-100 beats per minute)  What is your heart rate? 120 resting heart when she had Covid- diagnosed on Friday(01-22-23) She started having symptoms on Monday(01-18-23)  Do you have a log of your heart rate readings (document readings)?   Do you have any other symptoms? Jittery and shaky at this time- patient wants to know if this is normal for her heart rate to be this high with Covid or she says does she need to come see the doctor?

## 2023-01-26 NOTE — Telephone Encounter (Signed)
Call to patient. States increased HR since Friday.  She states it is currently in the 80's. She is currently taking Mucinex. She is also taking albuterol as well.  Advised that the albuterol can cause increase HR.  She states she realized that today.  She does state there was an episode of increase in the 100's without the albuterol as well.   She states when HR elevated, she is not sure how long it stays there because she was unaware it was high. She states was noted a 120 at a doctor office. BP was 126/80.  She states she is under stress. She states Asymptomatic when it increased.  Again she is currently in the 80's with no symptoms.   Advised hydration since taking Mucinex and having Covid as this also can increase HR.  Advised will call with further recommendations from provider.

## 2023-01-26 NOTE — Patient Instructions (Signed)
COVID-19 receently overall improving with possible secondary infection. Positive test on 01/22/2023 with onset of symptoms around 01/18/2023. Symptoms included cough, productive at times, and sinus pressure. No fever or chills currently. Some residual chest congestion and fatigue, particularly when climbing stairs. -Continue Alavert and Mucinex as needed for symptom management. -Add Flonase nasal spray for sinus congestion. -Add benzonatate for cough. -Order chest x-ray to rule out pneumonia. -If chest x-ray positive for pneumonia, start antibiotic. If negative, monitor sinus symptoms and start doxyccyline antibiotic if symptoms worsen. -Advise patient to monitor oxygen saturation and blood pressure daily and report readings on 01/28/2023 or 01/29/2023. Discovered good levels and paramter to be concerned. -use albuterol inhaler if needed for sob or wheezing.  follow up in 7-10 days or sooner if needed.

## 2023-01-26 NOTE — Progress Notes (Signed)
Subjective:    Patient ID: [name], female    DOB: 09/27/1965, 57 y.o.   MRN: [id number]  HPI The patient, with a recent diagnosis of COVID-19, reports onset of symptoms approximately a week prior to the positive test result. Initial symptoms included a lack of energy and a sensation of "feeling weird," which progressively worsened to include nocturnal coughing and choking due to significant postnasal drainage. The patient describes the drainage as thick and copious, leading to a desperate consumption of Mucinex, despite a known tendency for the medication to cause dehydration.  The patient also reports a transient period of chills at the onset of the illness, lasting approximately 48 hours, but denies any fever. Despite feeling warm at one point, temperature checks at home and during a previous visit did not indicate a fever.  The patient notes some residual chest congestion, which has improved but not completely resolved. She denies any current shortness of breath or wheezing, but reports a "whistling" sound heard during the night. The patient denies any current shortness of breath with exertion, such as climbing stairs, but does report feeling tired and slightly congested after such activities.  The patient also reports a "weird feeling" in her head, described as a heavy, numb sensation, and a "foggy head feeling." She also notes some sinus pressure in the forehead area.  The patient has been managing symptoms with Alavert and Mucinex, and reports that sleep has been disrupted, although the choking episodes have ceased. She has not taken any antiviral medications for the COVID-19 infection, as advised by a previous healthcare provider due to potential side effects and the timing of symptom onset.   Review of Systems  Constitutional:  Positive for fatigue. Negative for chills.  Respiratory:  Positive for cough. Negative for chest tightness, shortness of breath and wheezing.    Cardiovascular:  Negative for chest pain and palpitations.  Gastrointestinal:  Negative for abdominal pain.  Musculoskeletal:  Negative for back pain, myalgias and neck pain.       No leg pain.  Skin:  Negative for rash.  Neurological:  Negative for dizziness, seizures and headaches.  Hematological:  Negative for adenopathy. Does not bruise/bleed easily.  Psychiatric/Behavioral:  Negative for behavioral problems and confusion.     Past Medical History:  Diagnosis Date   Allergic rhinitis    Anemia    Anxiety    Asthma    Contact dermatitis and eczema    Diverticulitis    CT Scan   Esophageal reflux    H. pylori infection    Hyperlipidemia, mixed    IBS (irritable bowel syndrome)    IC (interstitial cystitis)    Internal hemorrhoids    Lumbago    Mitral valve disorders(424.0)    Osteoarthritis 07/08/2015   Post concussive syndrome    Seasonal and perennial allergic rhinitis 01/20/2015     Social History   Socioeconomic History   Marital status: Married    Spouse name: Not on file   Number of children: 2   Years of education: Masters   Highest education level: Master's degree (e.g., MA, MS, MEng, MEd, MSW, MBA)  Occupational History   Occupation: Clinical cytogeneticist  Tobacco Use   Smoking status: Never    Passive exposure: Past   Smokeless tobacco: Never  Vaping Use   Vaping status: Never Used  Substance and Sexual Activity   Alcohol use: Not Currently   Drug use: No   Sexual activity: Yes  Comment: lives with husband and son, 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   Right-handed.   1 cup caffeine daily.   Social Determinants of Health   Financial Resource Strain: Low Risk  (10/13/2022)   Overall Financial Resource Strain (CARDIA)    Difficulty of Paying Living Expenses: Not very hard  Food Insecurity: Patient Declined  (10/13/2022)   Hunger Vital Sign    Worried About Running Out of Food in the Last Year: Patient declined    Ran Out of Food in the Last Year: Patient declined  Transportation Needs: Unknown (10/13/2022)   PRAPARE - Administrator, Civil Service (Medical): No    Lack of Transportation (Non-Medical): Not on file  Physical Activity: Sufficiently Active (10/13/2022)   Exercise Vital Sign    Days of Exercise per Week: 5 days    Minutes of Exercise per Session: 60 min  Stress: No Stress Concern Present (10/13/2022)   [employer] of Occupational Health - Occupational Stress Questionnaire    Feeling of Stress : Only a little  Social Connections: Unknown (10/13/2022)   Social Connection and Isolation Panel [NHANES]    Frequency of Communication with Friends and Family: Three times a week    Frequency of Social Gatherings with Friends and Family: Patient declined    Attends Religious Services: Patient declined    Active Member of Clubs or Organizations: No    Attends Engineer, structural: Not on file    Marital Status: Patient declined  Intimate Partner Violence: Not on file    Past Surgical History:  Procedure Laterality Date   BARTHOLIN CYST MARSUPIALIZATION N/A 12/18/2013   Procedure: BARTHOLIN CYST MARSUPIALIZATION WITH BIOPSY;  Surgeon: Lenoard Aden, MD;  Location: [facility];  Service: Gynecology;  Laterality: N/A;   CESAREAN SECTION     DILITATION & CURRETTAGE/HYSTROSCOPY WITH NOVASURE ABLATION N/A 10/14/2012   Procedure: DILATATION & CURETTAGE/HYSTEROSCOPY WITH NOVASURE ABLATION;  Surgeon: Lenoard Aden, MD;  Location: [facility];  Service: Gynecology;  Laterality: N/A;   MOUTH SURGERY     SINUS SURGERY WITH INSTATRAK      Family History  Problem Relation Age of Onset   Diabetes Mother    Hypertension Mother    Heart attack Mother        MI at age 69   Alcohol abuse Mother        smoker   Heart disease Mother    Dementia Mother    Hypertension Father     Hyperlipidemia Father    Heart attack Father    Lung cancer Father        smoking hx; dx > 50   Colon polyps Sister    Mental illness Sister        anxiety from 9/11 survivors   Varicose Veins Sister    Osteoporosis Sister    Other Sister        hypoglycemia   Varicose Veins Sister    Rheum arthritis Sister        lupus   Pleurisy Sister    Fibromyalgia Sister    Mental retardation Sister        depression   Leukemia Brother        acute; dx 49s   GER disease Son    Colon cancer Paternal Uncle  dx unknown age   Dementia Maternal Grandmother    Heart disease Maternal Grandfather        mi   Cancer Paternal Grandmother        breast/ovairan before age 52   Alcohol abuse Other        Family history   Breast cancer Neg Hx     Allergies  Allergen Reactions   Amoxicillin Shortness Of Breath    Doesn't remember rxn   Aspirin Shortness Of Breath    "irritated stomach" Ibuprofen and Naproxen do not agree with her either   Codeine Hives, Shortness Of Breath and Other (See Comments)    Does not take percocet or hydrocodone; tylenol only   Latex Shortness Of Breath, Itching and Other (See Comments)   Other Shortness Of Breath and Other (See Comments)    peanuts   Sulfa Antibiotics Hives and Shortness Of Breath   Sulfamethoxazole-Trimethoprim Shortness Of Breath   Sulfonamide Derivatives Shortness Of Breath and Itching   Sulphadimidine Sodium [Sulfamethazine Sodium] Shortness Of Breath    Added to food for preservative   Ciprofloxacin Other (See Comments)    REACTION: SOB, "Tightness in head" REACTION: SOB, "Tightness in head" Syncope    Azithromycin Other (See Comments)    Faintness   Carisoprodol    Clarithromycin     "Doesn't agree with me"   Epinephrine     Heart racing   Fenofibrate Other (See Comments)    Leg cramps, body aches    Metronidazole Other (See Comments)    "cannot tolerate"   Nsaids    Peanut (Diagnostic) Other (See Comments)    other    Penicillins     Doesn't remember reaction   Tolmetin    Omeprazole Palpitations    Current Outpatient Medications on File Prior to Visit  Medication Sig Dispense Refill   Multiple Vitamins-Minerals (MULTIVITAMIN ADULT PO) Take 1 tablet by mouth daily.     OVER THE COUNTER MEDICATION Calcium     OVER THE COUNTER MEDICATION Omega 3 2100     Probiotic Product (PROBIOTIC DAILY PO) Take 1 tablet by mouth daily.     sodium chloride (OCEAN) 0.65 % SOLN nasal spray Place 1 spray into both nostrils as needed for congestion.     Current Facility-Administered Medications on File Prior to Visit  Medication Dose Route Frequency Provider Last Rate Last Admin   methylPREDNISolone acetate (DEPO-MEDROL) injection 80 mg  80 mg Intramuscular Once Young, Clinton D, MD        BP 118/80   Pulse 91   Temp 98.3 F (36.8 C) (Temporal)   Resp 18   Ht 4\' 11"  (1.499 m)   Wt 108 lb 4.8 oz (49.1 kg)   LMP 06/30/2016   SpO2 99%   BMI 21.87 kg/m        Objective:   Physical Exam  General Mental Status- Alert. General Appearance- Not in acute distress.   Skin General: Color- Normal Color. Moisture- Normal Moisture.  Neck Carotid Arteries- Normal color. Moisture- Normal Moisture. No carotid bruits. No JVD.  Chest and Lung Exam Auscultation: Breath Sounds:-clear even and unlabored  Cardiovascular Auscultation:Rythm- Regular. Murmurs & Other Heart Sounds:Auscultation of the heart reveals- No Murmurs.  Abdomen Inspection:-Inspeection Normal. Palpation/Percussion:Note:No mass. Palpation and Percussion of the abdomen reveal- Non Tender, Non Distended + BS, no rebound or guarding.   Neurologic Cranial Nerve exam:- CN III-XII intact(No nystagmus), symmetric smile. Strength:- 5/5 equal and symmetric strength both upper and lower extremities.  Lower ext- calfs appear symmetric on inspection.    Heent- nasal congested but no obvious sinus pressue to palpation presently. +pnd. Ears canals clear  and normal tm.  Assessment & Plan:   Patient Instructions  COVID-19 receently overall improving with possible secondary infection. Positive test on 01/22/2023 with onset of symptoms around 01/18/2023. Symptoms included cough, productive at times, and sinus pressure. No fever or chills currently. Some residual chest congestion and fatigue, particularly when climbing stairs. -Continue Alavert and Mucinex as needed for symptom management. -Add Flonase nasal spray for sinus congestion. -Add benzonatate for cough. -Order chest x-ray to rule out pneumonia. -If chest x-ray positive for pneumonia, start antibiotic. If negative, monitor sinus symptoms and start doxyccyline antibiotic if symptoms worsen. -Advise patient to monitor oxygen saturation and blood pressure daily and report readings on 01/28/2023 or 01/29/2023. Discovered good levels and paramter to be concerned. -use albuterol inhaler if needed for sob or wheezing.  follow up in 7-10 days or sooner if needed.   Esperanza Richters, PA-C     Time spent with patient today was  42 minutes which consisted of chart revdew, discussing diagnosis, work up, treatment, answering pt various questions and documentation.

## 2023-01-26 NOTE — Telephone Encounter (Signed)
Patient tested positive for Covid-19 on Friday. She was wondering if something could be called in for the congestion. Preferable a nasal spray.   Pharmacy [facility] in [facility] on [address]

## 2023-01-27 NOTE — Telephone Encounter (Signed)
I called and spoke with the pt She started having symptoms of nasal congestion and cough 01/20/23- tested pos for Covid 19 on 01/25/23  She is out of the window to take antiviral  She seen her PCP yesterday and was prescribed nasal spray and abx in case of future infection  She states she is feeling well today and denies need for any further advice  Nothing further needed

## 2023-01-29 ENCOUNTER — Telehealth: Payer: Self-pay | Admitting: Family Medicine

## 2023-01-29 NOTE — Telephone Encounter (Signed)
Sent mychart message letting her know we haven't received results yet and the radiology is running behind. We will be in touch when we get them back.

## 2023-01-29 NOTE — Telephone Encounter (Signed)
Pt called to check on the status of her US done on 01/23/23. After reviewing chart, advised pt that the radiologist had not read the images due to the "exam ended" status and a note would be sent back to look into the turnaround on the results.

## 2023-01-29 NOTE — Telephone Encounter (Signed)
Pt called with the following info for Ramon Dredge off her last appt with him:  10.2.24 @ 5p - BP 100/60 // 100/67 // 112/54 - Pul 80 - O2 99  10.3.24 @ 7:30a - BP 94/64 - Pul 88 - O2 No Data  10.3.24 @ 2p - O2 98  10.3.24 @ 4p - BP 83/54 - Pul 89  10.3.24 @ 9p - BP 112/65 - Pul 71

## 2023-01-31 ENCOUNTER — Encounter: Payer: Self-pay | Admitting: Medical

## 2023-02-01 NOTE — Telephone Encounter (Signed)
Pt called and lvm to return call 

## 2023-02-01 NOTE — Telephone Encounter (Signed)
[name] and pt spoke via [employer]

## 2023-02-15 ENCOUNTER — Encounter: Payer: Self-pay | Admitting: Medical

## 2023-02-15 ENCOUNTER — Ambulatory Visit: Payer: 59 | Admitting: Medical

## 2023-02-15 VITALS — BP 115/60 | HR 97 | Temp 98.8°F | Resp 16 | Wt 110.0 lb

## 2023-02-15 DIAGNOSIS — S46819S Strain of other muscles, fascia and tendons at shoulder and upper arm level, unspecified arm, sequela: Secondary | ICD-10-CM

## 2023-02-15 DIAGNOSIS — J322 Chronic ethmoidal sinusitis: Secondary | ICD-10-CM | POA: Diagnosis not present

## 2023-02-15 DIAGNOSIS — R0789 Other chest pain: Secondary | ICD-10-CM

## 2023-02-15 DIAGNOSIS — R5383 Other fatigue: Secondary | ICD-10-CM | POA: Diagnosis not present

## 2023-02-15 DIAGNOSIS — F489 Nonpsychotic mental disorder, unspecified: Secondary | ICD-10-CM

## 2023-02-15 LAB — EKG 12-LEAD

## 2023-02-15 NOTE — Patient Instructions (Addendum)
Post-Acute COVID-19 Syndrome Patient reports persistent symptoms including foggy-headedness, tingling sensation in the head(days ago but not now), and fatigue persisting after initial COVID-19 infection. No history of severe respiratory symptoms or complications. -Order CBC, metabolic panel, thyroid, and B12 labs to investigate potential causes of fatigue. -Consider reduced work schedule or leave of absence if symptoms persist. -if labs all negative might consider advising using N-acetylcysteine(nac) over-the-counter.  Could advise you on dosing and near future if needed.  Sinusitis(ethmoid) Patient reports pressure in the ethmoid sinuses, numbness in the forehead, and clear nasal discharge. No pain on palpation. -Start Doxycycline as previously prescribed. -Advise use of Flonase once daily in the morning and saline nasal spray at night to decrease inflammation and decongest sinuses.  Atypical Chest Pain 2 days ago. suspect was muscle pain vs cartlidge pain. Brief, transient chest pain likely related to muscle strain. -Perform EKG to check rhythm due to reported recent pain. EKG normal sinus rhythm. -if any recurrent pain that is constant with any associated signs/symptoms be seen in the emergency department.  You had mentioned brief tingling sensation in the back of the head recently but not on exam. Some trapezius tightness on exam. Your neurologic exam was normal. I think your mid head you have is from ethmoid sinus infection. If you have recurrent tingling or worse headache let me know. You mentioned advance imaging studies but I don't think that is needed presently. If in the event you have any ha with motor or sensory deficits be seen in the emergency department.  Variable bp level at home but normal lower range bp today when I checked wtice Patient reports variable blood pressure readings at home, with some readings on the lower side. -Advise patient to bring home blood pressure monitor to  next appointment for calibration check.      -Schedule follow-up appointment in one week.(Try to schedule with Dr. Abner Greenspan pcp)

## 2023-02-15 NOTE — Progress Notes (Signed)
Subjective:    Patient ID: [name], female    DOB: Feb 24, 1966, 57 y.o.   MRN: [id number]  HPI Discussed the use of AI scribe software for clinical note transcription with the patient, who gave verbal consent to proceed.  History of Present Illness   The patient, with a history of asthma, tested positive for COVID-19 three weeks ago. She did not receive Paxlovid due to the timing of her presentation and potential side effects. She experienced shortness of breath, particularly when ascending stairs, which prompted her to seek medical attention.  for weeks now no shortness of breath or wheezing.    She also reported a cough and nasal drainage.  She also reported a change in her voice, with others noting that she sounds congested. She has been producing clear mucus and has developed a dry cough.  The patient also reported a brief episode of chest pain, which lasted only a few seconds. She has noticed fluctuations in her blood pressure, with readings varying from low to slightly elevated. though she did not bring her machine in office today.   She has been experiencing fatigue and has found it difficult to concentrate, which has led her to consider a reduced work schedule or a leave of absence. She described a sensation of pressure in in ethmoid sinus area and a foggy-headed feeling since last visit. She has been using a natural nasal spray containing ginger to manage her symptoms. She has not yet started the prescribed doxycycline or Flonase due to concerns about potential side effects and interactions with her body's response to COVID-19.          Review of Systems  Constitutional:  Positive for fatigue.  HENT:  Positive for congestion and sinus pressure.   Cardiovascular:  Negative for chest pain and palpitations.       None currently/  Gastrointestinal:  Negative for abdominal pain, blood in stool, constipation and diarrhea.  Musculoskeletal:  Negative for back pain.   Neurological:  Negative for dizziness, weakness and numbness.       See hpi. Ha presently I think sinus type  Hematological:  Negative for adenopathy. Does not bruise/bleed easily.  Psychiatric/Behavioral:         Seems stressed about illness    Past Medical History:  Diagnosis Date   Allergic rhinitis    Anemia    Anxiety    Asthma    Contact dermatitis and eczema    Diverticulitis    CT Scan   Esophageal reflux    H. pylori infection    Hyperlipidemia, mixed    IBS (irritable bowel syndrome)    IC (interstitial cystitis)    Internal hemorrhoids    Lumbago    Mitral valve disorders(424.0)    Osteoarthritis 07/08/2015   Post concussive syndrome    Seasonal and perennial allergic rhinitis 01/20/2015     Social History   Socioeconomic History   Marital status: Married    Spouse name: Not on file   Number of children: 2   Years of education: Masters   Highest education level: Master's degree (e.g., MA, MS, MEng, MEd, MSW, MBA)  Occupational History   Occupation: Clinical cytogeneticist  Tobacco Use   Smoking status: Never    Passive exposure: Past   Smokeless tobacco: Never  Vaping Use   Vaping status: Never Used  Substance and Sexual Activity   Alcohol use: Not Currently   Drug use: No   Sexual activity: Yes  Comment: lives with husband and son, 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   Right-handed.   1 cup caffeine daily.   Social Determinants of Health   Financial Resource Strain: Low Risk  (10/13/2022)   Overall Financial Resource Strain (CARDIA)    Difficulty of Paying Living Expenses: Not very hard  Food Insecurity: Patient Declined (10/13/2022)   Hunger Vital Sign    Worried About Running Out of Food in the Last Year: Patient declined    Ran Out of Food in the Last Year: Patient declined  Transportation Needs: Unknown  (10/13/2022)   PRAPARE - Administrator, Civil Service (Medical): No    Lack of Transportation (Non-Medical): Not on file  Physical Activity: Sufficiently Active (10/13/2022)   Exercise Vital Sign    Days of Exercise per Week: 5 days    Minutes of Exercise per Session: 60 min  Stress: No Stress Concern Present (10/13/2022)   [employer] of Occupational Health - Occupational Stress Questionnaire    Feeling of Stress : Only a little  Social Connections: Unknown (10/13/2022)   Social Connection and Isolation Panel [NHANES]    Frequency of Communication with Friends and Family: Three times a week    Frequency of Social Gatherings with Friends and Family: Patient declined    Attends Religious Services: Patient declined    Active Member of Clubs or Organizations: No    Attends Engineer, structural: Not on file    Marital Status: Patient declined  Intimate Partner Violence: Not on file    Past Surgical History:  Procedure Laterality Date   BARTHOLIN CYST MARSUPIALIZATION N/A 12/18/2013   Procedure: BARTHOLIN CYST MARSUPIALIZATION WITH BIOPSY;  Surgeon: Lenoard Aden, MD;  Location: [facility];  Service: Gynecology;  Laterality: N/A;   CESAREAN SECTION     DILITATION & CURRETTAGE/HYSTROSCOPY WITH NOVASURE ABLATION N/A 10/14/2012   Procedure: DILATATION & CURETTAGE/HYSTEROSCOPY WITH NOVASURE ABLATION;  Surgeon: Lenoard Aden, MD;  Location: [facility];  Service: Gynecology;  Laterality: N/A;   MOUTH SURGERY     SINUS SURGERY WITH INSTATRAK      Family History  Problem Relation Age of Onset   Diabetes Mother    Hypertension Mother    Heart attack Mother        MI at age 6   Alcohol abuse Mother        smoker   Heart disease Mother    Dementia Mother    Hypertension Father    Hyperlipidemia Father    Heart attack Father    Lung cancer Father        smoking hx; dx > 50   Colon polyps Sister    Mental illness Sister        anxiety from 9/11 survivors    Varicose Veins Sister    Osteoporosis Sister    Other Sister        hypoglycemia   Varicose Veins Sister    Rheum arthritis Sister        lupus   Pleurisy Sister    Fibromyalgia Sister    Mental retardation Sister        depression   Leukemia Brother        acute; dx 70s   GER disease Son    Colon cancer Paternal Uncle  dx unknown age   Dementia Maternal Grandmother    Heart disease Maternal Grandfather        mi   Cancer Paternal Grandmother        breast/ovairan before age 18   Alcohol abuse Other        Family history   Breast cancer Neg Hx     Allergies  Allergen Reactions   Amoxicillin Shortness Of Breath    Doesn't remember rxn   Aspirin Shortness Of Breath    "irritated stomach" Ibuprofen and Naproxen do not agree with her either   Codeine Hives, Shortness Of Breath and Other (See Comments)    Does not take percocet or hydrocodone; tylenol only   Latex Shortness Of Breath, Itching and Other (See Comments)   Other Shortness Of Breath and Other (See Comments)    peanuts   Sulfa Antibiotics Hives and Shortness Of Breath   Sulfamethoxazole-Trimethoprim Shortness Of Breath   Sulfonamide Derivatives Shortness Of Breath and Itching   Sulphadimidine Sodium [Sulfamethazine Sodium] Shortness Of Breath    Added to food for preservative   Ciprofloxacin Other (See Comments)    REACTION: SOB, "Tightness in head" REACTION: SOB, "Tightness in head" Syncope    Azithromycin Other (See Comments)    Faintness   Carisoprodol    Clarithromycin     "Doesn't agree with me"   Epinephrine     Heart racing   Fenofibrate Other (See Comments)    Leg cramps, body aches    Metronidazole Other (See Comments)    "cannot tolerate"   Nsaids    Peanut (Diagnostic) Other (See Comments)    other   Penicillins     Doesn't remember reaction   Tolmetin    Omeprazole Palpitations    Current Outpatient Medications on File Prior to Visit  Medication Sig Dispense Refill    benzonatate (TESSALON) 100 MG capsule Take 1 capsule (100 mg total) by mouth 3 (three) times daily as needed for cough. 30 capsule 0   doxycycline (VIBRA-TABS) 100 MG tablet Take 1 tablet (100 mg total) by mouth 2 (two) times daily. 14 tablet 0   fluticasone (FLONASE) 50 MCG/ACT nasal spray Place 2 sprays into both nostrils daily. 16 g 1   Multiple Vitamins-Minerals (MULTIVITAMIN ADULT PO) Take 1 tablet by mouth daily.     OVER THE COUNTER MEDICATION Calcium     OVER THE COUNTER MEDICATION Omega 3 2100     Probiotic Product (PROBIOTIC DAILY PO) Take 1 tablet by mouth daily.     sodium chloride (OCEAN) 0.65 % SOLN nasal spray Place 1 spray into both nostrils as needed for congestion.     Current Facility-Administered Medications on File Prior to Visit  Medication Dose Route Frequency Provider Last Rate Last Admin   methylPREDNISolone acetate (DEPO-MEDROL) injection 80 mg  80 mg Intramuscular Once Young, Clinton D, MD        BP 115/60   Pulse 97   Temp 98.8 F (37.1 C) (Oral)   Resp 16   Wt 110 lb (49.9 kg)   LMP 06/30/2016   SpO2 99%   BMI 22.22 kg/m        Objective:   Physical Exam  General Mental Status- Alert. General Appearance- Not in acute distress.   Skin General: Color- Normal Color. Moisture- Normal Moisture.  Neck Carotid Arteries- Normal color. Moisture- Normal Moisture. No carotid bruits. No JVD.  Chest and Lung Exam Auscultation: Breath Sounds:-Normal.  Cardiovascular Auscultation:Rythm- Regular. Murmurs & Other Heart  Sounds:Auscultation of the heart reveals- No Murmurs.  Abdomen Inspection:-Inspeection Normal. Palpation/Percussion:Note:No mass. Palpation and Percussion of the abdomen reveal- Non Tender, Non Distended + BS, no rebound or guarding.    Neurologic Cranial Nerve exam:- CN III-XII intact(No nystagmus), symmetric smile. Drift Test:- No drift. Romberg Exam:- Negative.  Heal to Toe Gait exam:-Normal. Finger to Nose:-  Normal/Intact Strength:- 5/5 equal and symmetric strength both upper and lower extremities.    Heent- moderate ethmoid sinus tender to palpation. Canals clear and mrmal tm.  Normal posterior pharynx.     Assessment & Plan:   Assessment and Plan Patient Instructions  Post-Acute COVID-19 Syndrome Patient reports persistent symptoms including foggy-headedness, tingling sensation in the head(days ago but not now), and fatigue persisting after initial COVID-19 infection. No history of severe respiratory symptoms or complications. -Order CBC, metabolic panel, thyroid, and B12 labs to investigate potential causes of fatigue. -Consider reduced work schedule or leave of absence if symptoms persist. -if labs all negative might consider advising using N-acetylcysteine(nac) over-the-counter.  Could advise you on dosing and near future if needed.  Sinusitis(ethmoid) Patient reports pressure in the ethmoid sinuses, numbness in the forehead, and clear nasal discharge. No pain on palpation. -Start Doxycycline as previously prescribed. -Advise use of Flonase once daily in the morning and saline nasal spray at night to decrease inflammation and decongest sinuses.  Atypical Chest Pain 2 days ago. suspect was muscle pain vs cartlidge pain. Brief, transient chest pain likely related to muscle strain. -Perform EKG to check rhythm due to reported recent pain. EKG normal sinus rhythm. -if any recurrent pain that is constant with any associated signs/symptoms be seen in the emergency department.  You had mentioned brief tingling sensation in the back of the head recently but not on exam. Some trapezius tightness on exam. Your neurologic exam was normal. I think your mid head you have is from ethmoid sinus infection. If you have recurrent tingling or worse headache let me know. You mentioned advance imaging studies but I don't think that is needed presently. If in the event you have any ha with motor or sensory  deficits be seen in the emergency department.  Variable bp level at home but normal lower range bp today when I checked wtice Patient reports variable blood pressure readings at home, with some readings on the lower side. -Advise patient to bring home blood pressure monitor to next appointment for calibration check.      -Schedule follow-up appointment in one week.(Try to schedule with Dr. Jobe Marker)     Esperanza Richters, PA-C   Time spent with patient today was 46  minutes which consisted of chart revdiew, discussing diagnosis, work up treatment and documentation. Time did not include EKG.

## 2023-02-16 ENCOUNTER — Other Ambulatory Visit (INDEPENDENT_AMBULATORY_CARE_PROVIDER_SITE_OTHER): Payer: 59

## 2023-02-16 DIAGNOSIS — R5383 Other fatigue: Secondary | ICD-10-CM

## 2023-02-17 LAB — CBC WITH DIFFERENTIAL/PLATELET
Basophils Absolute: 0 10*3/uL (ref 0.0–0.1)
Basophils Relative: 0.8 % (ref 0.0–3.0)
Eosinophils Absolute: 0.1 10*3/uL (ref 0.0–0.7)
Eosinophils Relative: 3 % (ref 0.0–5.0)
HCT: 39.3 % (ref 36.0–46.0)
Hemoglobin: 13 g/dL (ref 12.0–15.0)
Lymphocytes Relative: 49.4 % — ABNORMAL HIGH (ref 12.0–46.0)
Lymphs Abs: 2.3 10*3/uL (ref 0.7–4.0)
MCHC: 33.1 g/dL (ref 30.0–36.0)
MCV: 98.4 fL (ref 78.0–100.0)
Monocytes Absolute: 0.5 10*3/uL (ref 0.1–1.0)
Monocytes Relative: 10.4 % (ref 3.0–12.0)
Neutro Abs: 1.7 10*3/uL (ref 1.4–7.7)
Neutrophils Relative %: 36.4 % — ABNORMAL LOW (ref 43.0–77.0)
Platelets: 256 10*3/uL (ref 150.0–400.0)
RBC: 4 Mil/uL (ref 3.87–5.11)
RDW: 12.7 % (ref 11.5–15.5)
WBC: 4.6 10*3/uL (ref 4.0–10.5)

## 2023-02-17 LAB — COMPREHENSIVE METABOLIC PANEL
ALT: 13 U/L (ref 0–35)
AST: 17 U/L (ref 0–37)
Albumin: 4.3 g/dL (ref 3.5–5.2)
Alkaline Phosphatase: 99 U/L (ref 39–117)
BUN: 13 mg/dL (ref 6–23)
CO2: 30 meq/L (ref 19–32)
Calcium: 9.6 mg/dL (ref 8.4–10.5)
Chloride: 100 meq/L (ref 96–112)
Creatinine, Ser: 0.61 mg/dL (ref 0.40–1.20)
GFR: 98.96 mL/min (ref >60.00)
Glucose, Bld: 79 mg/dL (ref 70–99)
Potassium: 3.9 meq/L (ref 3.5–5.1)
Sodium: 137 meq/L (ref 135–145)
Total Bilirubin: 0.6 mg/dL (ref 0.2–1.2)
Total Protein: 7.1 g/dL (ref 6.0–8.3)

## 2023-02-17 LAB — TSH: TSH: 1.86 u[IU]/mL (ref 0.35–5.50)

## 2023-02-17 LAB — VITAMIN B12: Vitamin B-12: 810 pg/mL (ref 211–911)

## 2023-02-18 NOTE — Telephone Encounter (Signed)
Will you get pt scheduled with Dr. Abner Greenspan. Follow up from covid. Pt sent disability form/fmla form related to covid. She has not seen Dr. Abner Greenspan yet.

## 2023-02-19 ENCOUNTER — Encounter: Payer: Self-pay | Admitting: Medical

## 2023-02-19 ENCOUNTER — Telehealth: Payer: Self-pay | Admitting: Family Medicine

## 2023-02-19 ENCOUNTER — Telehealth: Payer: Self-pay | Admitting: Internal Medicine

## 2023-02-19 DIAGNOSIS — R0683 Snoring: Secondary | ICD-10-CM

## 2023-02-19 NOTE — Telephone Encounter (Signed)
Patient called and stated that she saw Ramon Dredge on the 21st regarding her Covid symptoms. She would like to inform Ramon Dredge that her job will be sending documentation for FMLA via fax, for the time she took a week off from work. Additionally, they will be sending more documents for recommendations for the patient needing a reduced work schedule next week. [name] asked if Ramon Dredge or nurse could give her call once the form for recommendations are received. Please advise patient.

## 2023-02-19 NOTE — Telephone Encounter (Signed)
Spoke to patient and relayed below message/recommendations. She would like to proceed with HST. Order has been placed.  Routing to Dr. Maple Hudson as an Lorain Childes

## 2023-02-19 NOTE — Telephone Encounter (Signed)
Called and spoke to patient.  She would like to be tested for OSA. She stated that she has had multiple episodes of waking during the night feeling like she is gasping for air. C/o daytime sleepiness, heart racing, irregular blood pressure and issues focusing.  She was dx with covid 3 weeks ago.  She feels that sx have worsened since.   Dr. Maple Hudson, please advise. Thanks  Current Outpatient Medications on File Prior to Visit  Medication Sig Dispense Refill   benzonatate (TESSALON) 100 MG capsule Take 1 capsule (100 mg total) by mouth 3 (three) times daily as needed for cough. 30 capsule 0   doxycycline (VIBRA-TABS) 100 MG tablet Take 1 tablet (100 mg total) by mouth 2 (two) times daily. 14 tablet 0   fluticasone (FLONASE) 50 MCG/ACT nasal spray Place 2 sprays into both nostrils daily. 16 g 1   Multiple Vitamins-Minerals (MULTIVITAMIN ADULT PO) Take 1 tablet by mouth daily.     OVER THE COUNTER MEDICATION Calcium     OVER THE COUNTER MEDICATION Omega 3 2100     Probiotic Product (PROBIOTIC DAILY PO) Take 1 tablet by mouth daily.     sodium chloride (OCEAN) 0.65 % SOLN nasal spray Place 1 spray into both nostrils as needed for congestion.     Current Facility-Administered Medications on File Prior to Visit  Medication Dose Route Frequency Provider Last Rate Last Admin   methylPREDNISolone acetate (DEPO-MEDROL) injection 80 mg  80 mg Intramuscular Once Jetty Duhamel D, MD        Allergies  Allergen Reactions   Amoxicillin Shortness Of Breath    Doesn't remember rxn   Aspirin Shortness Of Breath    "irritated stomach" Ibuprofen and Naproxen do not agree with her either   Codeine Hives, Shortness Of Breath and Other (See Comments)    Does not take percocet or hydrocodone; tylenol only   Latex Shortness Of Breath, Itching and Other (See Comments)   Other Shortness Of Breath and Other (See Comments)    peanuts   Sulfa Antibiotics Hives and Shortness Of Breath    Sulfamethoxazole-Trimethoprim Shortness Of Breath   Sulfonamide Derivatives Shortness Of Breath and Itching   Sulphadimidine Sodium [Sulfamethazine Sodium] Shortness Of Breath    Added to food for preservative   Ciprofloxacin Other (See Comments)    REACTION: SOB, "Tightness in head" REACTION: SOB, "Tightness in head" Syncope    Azithromycin Other (See Comments)    Faintness   Carisoprodol    Clarithromycin     "Doesn't agree with me"   Epinephrine     Heart racing   Fenofibrate Other (See Comments)    Leg cramps, body aches    Metronidazole Other (See Comments)    "cannot tolerate"   Nsaids    Peanut (Diagnostic) Other (See Comments)    other   Penicillins     Doesn't remember reaction   Tolmetin    Omeprazole Palpitations

## 2023-02-19 NOTE — Telephone Encounter (Signed)
She had a sleep study in January showing minimal sleep apnea and we decided not to do anything now. Her insurance might not agree to another study in 2024. Does she want to go ahead with a repeat home test, or an in-center overnight split night test, or wait until next year (2025)?

## 2023-02-19 NOTE — Telephone Encounter (Signed)
Patient states gasping for air. Would like test for sleep apnea. Blood pressure has been high and low. No available appointments at this time. Patient phone number is [phone].

## 2023-02-20 LAB — VITAMIN B1: Vitamin B1 (Thiamine): 15 nmol/L (ref 8–30)

## 2023-02-22 NOTE — Telephone Encounter (Signed)
Pt has been scheduled for 03/01/23

## 2023-02-23 ENCOUNTER — Telehealth: Payer: Self-pay | Admitting: Internal Medicine

## 2023-02-23 NOTE — Telephone Encounter (Signed)
Pt scheduled 03/01/23

## 2023-02-23 NOTE — Telephone Encounter (Signed)
Pt calling in to get a HST done and the one at home is charging her $275 and she wants to know if she does it in lab will it just be a copay

## 2023-02-24 NOTE — Telephone Encounter (Signed)
Spoke with patient and she doesn't need an extended work note until Monday

## 2023-02-24 NOTE — Telephone Encounter (Signed)
Terri with [employer] called to verify appts for patient due to [facility] pw request. Confirmed appointments. Please call Terri back to confirm if pt has been written out of work and what condition she was taken out of work for. Please call Terri at [phone]

## 2023-02-25 NOTE — Telephone Encounter (Signed)
Called [employer] back and advised that patient has appointment on Monday and we will fill out paperwork at that time.  They will make a note.

## 2023-02-27 NOTE — Telephone Encounter (Signed)
Dr. Maple Hudson, please advise if you are okay with [place] ordering an in lab study for pt.

## 2023-02-28 NOTE — Telephone Encounter (Signed)
Ok with me to do either a home sleep test or an in-center NPSG. She is concerned about cost to her. Suggest she contact her insurance to see if they would cover one cheaper for her than the other.

## 2023-03-01 ENCOUNTER — Telehealth: Payer: 59 | Admitting: Family Medicine

## 2023-03-01 ENCOUNTER — Telehealth: Payer: Self-pay | Admitting: Family Medicine

## 2023-03-01 ENCOUNTER — Encounter: Payer: Self-pay | Admitting: Family Medicine

## 2023-03-01 VITALS — BP 104/70 | HR 79 | Temp 97.2°F | Wt 108.0 lb

## 2023-03-01 DIAGNOSIS — U099 Post covid-19 condition, unspecified: Secondary | ICD-10-CM | POA: Insufficient documentation

## 2023-03-01 DIAGNOSIS — J4520 Mild intermittent asthma, uncomplicated: Secondary | ICD-10-CM

## 2023-03-01 DIAGNOSIS — E785 Hyperlipidemia, unspecified: Secondary | ICD-10-CM

## 2023-03-01 DIAGNOSIS — K5900 Constipation, unspecified: Secondary | ICD-10-CM

## 2023-03-01 DIAGNOSIS — R739 Hyperglycemia, unspecified: Secondary | ICD-10-CM

## 2023-03-01 DIAGNOSIS — N186 End stage renal disease: Secondary | ICD-10-CM | POA: Diagnosis not present

## 2023-03-01 MED ORDER — HYDROCORTISONE ACETATE 25 MG RE SUPP: 25.0000 mg | Freq: Two times a day (BID) | RECTAL | 1 refills | Status: DC | PRN

## 2023-03-01 MED ORDER — AMOXICILLIN-POT CLAVULANATE 875-125 MG PO TABS
1.0000 | ORAL_TABLET | Freq: Two times a day (BID) | ORAL | 0 refills | Status: DC
Start: 2023-03-01 — End: 2023-04-30

## 2023-03-01 NOTE — Telephone Encounter (Signed)
Patient would like to have the home sleep study instead of the in lab.

## 2023-03-01 NOTE — Assessment & Plan Note (Signed)
Encourage heart healthy diet such as MIND or DASH diet, increase exercise, avoid trans fats, simple carbohydrates and processed foods, consider a krill or fish or flaxseed oil cap daily.  °

## 2023-03-01 NOTE — Progress Notes (Signed)
MyChart Video Visit    Virtual Visit via Video Note   This patient is at least at moderate risk for complications without adequate follow up. This format is felt to be most appropriate for this patient at this time. Physical exam was limited by quality of the video and audio technology used for the visit. Juanetta, CMA was able to get the patient set up on a video visit.  Patient location: home Patient and provider in visit Provider location: Office  I discussed the limitations of evaluation and management by telemedicine and the availability of in person appointments. The patient expressed understanding and agreed to proceed.  Visit Date: 03/01/2023  Today's healthcare provider: Danise Edge, MD     Subjective:    Patient ID: [name], female    DOB: 04-Oct-1965, 57 y.o.   MRN: [id number]  Chief Complaint  Patient presents with  . Follow-up    HPI Discussed the use of AI scribe software for clinical note transcription with the patient, who gave verbal consent to proceed.  History of Present Illness   The patient, with a history of irritable bowel syndrome, presents with severe constipation and suspected hemorrhoids. They describe an episode of extreme pain and straining during bowel movements, which lasted for about 30 minutes and left them exhausted. The patient reports passing hard, pebble-like stools and experiencing burning and scratchiness during bowel movements. They also mention a recent episode of irritable bowel syndrome. The patient has been feeling lethargic and their sleep pattern is disrupted.  In addition to these gastrointestinal issues, the patient reports an adverse reaction to Doxycycline, which they stopped taking due to severe head pressure and pain. They also mention having recently recovered from COVID-19, and they are still experiencing post-viral fatigue. The patient expresses concern about their recovery, as they feel they might be regressing.         Past Medical History:  Diagnosis Date  . Allergic rhinitis   . Anemia   . Anxiety   . Asthma   . Contact dermatitis and eczema   . Diverticulitis    CT Scan  . Esophageal reflux   . H. pylori infection   . Hyperlipidemia, mixed   . IBS (irritable bowel syndrome)   . IC (interstitial cystitis)   . Internal hemorrhoids   . Lumbago   . Mitral valve disorders(424.0)   . Osteoarthritis 07/08/2015  . Post concussive syndrome   . Seasonal and perennial allergic rhinitis 01/20/2015    Past Surgical History:  Procedure Laterality Date  . BARTHOLIN CYST MARSUPIALIZATION N/A 12/18/2013   Procedure: BARTHOLIN CYST MARSUPIALIZATION WITH BIOPSY;  Surgeon: Lenoard Aden, MD;  Location: [facility];  Service: Gynecology;  Laterality: N/A;  . CESAREAN SECTION    . DILITATION & CURRETTAGE/HYSTROSCOPY WITH NOVASURE ABLATION N/A 10/14/2012   Procedure: DILATATION & CURETTAGE/HYSTEROSCOPY WITH NOVASURE ABLATION;  Surgeon: Lenoard Aden, MD;  Location: [facility];  Service: Gynecology;  Laterality: N/A;  . MOUTH SURGERY    . SINUS SURGERY WITH INSTATRAK      Family History  Problem Relation Age of Onset  . Diabetes Mother   . Hypertension Mother   . Heart attack Mother        MI at age 24  . Alcohol abuse Mother        smoker  . Heart disease Mother   . Dementia Mother   . Hypertension Father   . Hyperlipidemia Father   . Heart attack Father   .  Lung cancer Father        smoking hx; dx > 50  . Colon polyps Sister   . Mental illness Sister        anxiety from 9/11 survivors  . Varicose Veins Sister   . Osteoporosis Sister   . Other Sister        hypoglycemia  . Varicose Veins Sister   . Rheum arthritis Sister        lupus  . Pleurisy Sister   . Fibromyalgia Sister   . Mental retardation Sister        depression  . Leukemia Brother        acute; dx 70s  . GER disease Son   . Colon cancer Paternal Uncle        dx unknown age  . Dementia Maternal Grandmother   . Heart  disease Maternal Grandfather        mi  . Cancer Paternal Grandmother        breast/ovairan before age 1  . Alcohol abuse Other        Family history  . Breast cancer Neg Hx     Social History   Socioeconomic History  . Marital status: Married    Spouse name: Not on file  . Number of children: 2  . Years of education: Masters  . Highest education level: Master's degree (e.g., MA, MS, MEng, MEd, MSW, MBA)  Occupational History  . Occupation: [employer]  Tobacco Use  . Smoking status: Never    Passive exposure: Past  . Smokeless tobacco: Never  Vaping Use  . Vaping status: Never Used  Substance and Sexual Activity  . Alcohol use: Not Currently  . Drug use: No  . Sexual activity: Yes    Comment: lives with husband and son, work with [employer] as a Psychologist, educational, aoivd gluten, dairy  Other Topics Concern  . Not on file  Social History Narrative   Lives at home home with husband and son.   Right-handed.   1 cup caffeine daily.   Social Determinants of Health   Financial Resource Strain: Low Risk  (10/13/2022)   Overall Financial Resource Strain (CARDIA)   . Difficulty of Paying Living Expenses: Not very hard  Food Insecurity: Patient Declined (10/13/2022)   Hunger Vital Sign   . Worried About Programme researcher, broadcasting/film/video in the Last Year: Patient declined   . Ran Out of Food in the Last Year: Patient declined  Transportation Needs: Unknown (10/13/2022)   PRAPARE - Transportation   . Lack of Transportation (Medical): No   . Lack of Transportation (Non-Medical): Not on file  Physical Activity: Sufficiently Active (10/13/2022)   Exercise Vital Sign   . Days of Exercise per Week: 5 days   . Minutes of Exercise per Session: 60 min  Stress: No Stress Concern Present (10/13/2022)   [employer] of Occupational Health - Occupational Stress Questionnaire   . Feeling of Stress : Only a little  Social Connections: Unknown (10/13/2022)   Social Connection and Isolation  Panel [NHANES]   . Frequency of Communication with Friends and Family: Three times a week   . Frequency of Social Gatherings with Friends and Family: Patient declined   . Attends Religious Services: Patient declined   . Active Member of Clubs or Organizations: No   . Attends Banker Meetings: Not on file   . Marital Status: Patient declined  Intimate Partner Violence: Not on file    Outpatient  Medications Prior to Visit  Medication Sig Dispense Refill  . benzonatate (TESSALON) 100 MG capsule Take 1 capsule (100 mg total) by mouth 3 (three) times daily as needed for cough. 30 capsule 0  . fluticasone (FLONASE) 50 MCG/ACT nasal spray Place 2 sprays into both nostrils daily. 16 g 1  . Multiple Vitamins-Minerals (MULTIVITAMIN ADULT PO) Take 1 tablet by mouth daily.    Marland Kitchen OVER THE COUNTER MEDICATION Calcium    . OVER THE COUNTER MEDICATION Omega 3 2100    . Probiotic Product (PROBIOTIC DAILY PO) Take 1 tablet by mouth daily.    . sodium chloride (OCEAN) 0.65 % SOLN nasal spray Place 1 spray into both nostrils as needed for congestion.    Marland Kitchen doxycycline (VIBRA-TABS) 100 MG tablet Take 1 tablet (100 mg total) by mouth 2 (two) times daily. 14 tablet 0   Facility-Administered Medications Prior to Visit  Medication Dose Route Frequency Provider Last Rate Last Admin  . methylPREDNISolone acetate (DEPO-MEDROL) injection 80 mg  80 mg Intramuscular Once Waymon Budge, MD        Allergies  Allergen Reactions  . Aspirin Shortness Of Breath    "irritated stomach" Ibuprofen and Naproxen do not agree with her either  . Codeine Hives, Shortness Of Breath and Other (See Comments)    Does not take percocet or hydrocodone; tylenol only  . Doxycycline Other (See Comments)    Headache after one dose  . Latex Shortness Of Breath, Itching and Other (See Comments)  . Other Shortness Of Breath and Other (See Comments)    peanuts  . Sulfa Antibiotics Hives and Shortness Of Breath  .  Sulfamethoxazole-Trimethoprim Shortness Of Breath  . Sulfonamide Derivatives Shortness Of Breath and Itching  . Sulphadimidine Sodium [Sulfamethazine Sodium] Shortness Of Breath    Added to food for preservative  . Ciprofloxacin Other (See Comments)    REACTION: SOB, "Tightness in head" REACTION: SOB, "Tightness in head" Syncope   . Azithromycin Other (See Comments)    Faintness  . Carisoprodol   . Clarithromycin     "Doesn't agree with me"  . Epinephrine     Heart racing  . Fenofibrate Other (See Comments)    Leg cramps, body aches   . Metronidazole Other (See Comments)    "cannot tolerate"  . Nsaids   . Peanut (Diagnostic) Other (See Comments)    other  . Penicillins     Doesn't remember reaction  . Tolmetin   . Omeprazole Palpitations    Review of Systems  Constitutional:  Positive for malaise/fatigue. Negative for fever.  HENT:  Negative for congestion.   Eyes:  Negative for blurred vision.  Respiratory:  Negative for shortness of breath.   Cardiovascular:  Negative for chest pain, palpitations and leg swelling.  Gastrointestinal:  Positive for abdominal pain, blood in stool, constipation and diarrhea. Negative for melena and nausea.  Genitourinary:  Negative for dysuria and frequency.  Musculoskeletal:  Negative for falls.  Skin:  Negative for rash.  Neurological:  Negative for dizziness, loss of consciousness and headaches.  Endo/Heme/Allergies:  Negative for environmental allergies.  Psychiatric/Behavioral:  Negative for depression. The patient is nervous/anxious.        Objective:    Physical Exam Constitutional:      General: She is not in acute distress.    Appearance: Normal appearance. She is not ill-appearing or toxic-appearing.  HENT:     Head: Normocephalic and atraumatic.     Right Ear: External ear normal.  Left Ear: External ear normal.     Nose: Nose normal.  Eyes:     General:        Right eye: No discharge.        Left eye: No  discharge.  Pulmonary:     Effort: Pulmonary effort is normal.  Skin:    Findings: No rash.  Neurological:     Mental Status: She is alert and oriented to person, place, and time.  Psychiatric:        Behavior: Behavior normal.   BP 104/70   Pulse 79   Temp (!) 97.2 F (36.2 C) (Oral)   Wt 108 lb (49 kg) Comment: Pt stated  LMP 06/30/2016   SpO2 97%   BMI 21.81 kg/m  Wt Readings from Last 3 Encounters:  03/01/23 108 lb (49 kg)  02/15/23 110 lb (49.9 kg)  01/26/23 108 lb 4.8 oz (49.1 kg)       Assessment & Plan:  renal insufficiency Assessment & Plan: Hydrate and monitor    Hyperlipidemia, mild Assessment & Plan: Encourage heart healthy diet such as MIND or DASH diet, increase exercise, avoid trans fats, simple carbohydrates and processed foods, consider a krill or fish or flaxseed oil cap daily.    Hyperglycemia Assessment & Plan: hgba1c acceptable, minimize simple carbs. Increase exercise as tolerated.    Allergic asthma, mild intermittent, uncomplicated Assessment & Plan: No recent exacerbation   Post covid-19 condition, unspecified Assessment & Plan: Hydrate and monitor, daily MVI, fatty acid supplements   Constipation, unspecified constipation type Assessment & Plan: Flared with covid since she was not eating well, hydrating or exercising. She noted pebble type BMs and straining was painful. She now notes a likely hemorrhoid which is protruding but no bleeding. Still straining and burns will try Anusol HC suppositories if no improvement will call GI   Other orders -     Hydrocortisone Acetate; Place 1 suppository (25 mg total) rectally 2 (two) times daily as needed for hemorrhoids or anal itching.  Dispense: 20 suppository; Refill: 1 -     Amoxicillin-Pot Clavulanate; Take 1 tablet by mouth 2 (two) times daily.  Dispense: 14 tablet; Refill: 0     Assessment and Plan    Post-Acute Sequelae of SARS-CoV-2 infection (PASC) Reports of intermittent  fatigue, disrupted sleep, and general malaise following COVID-19 infection. -Encourage hydration, exercise, balanced diet, and vitamin supplementation for recovery.  Irritable Bowel Syndrome (IBS) Recent severe episode with straining, hard stools, and pain. Current symptoms include hard, pebble-like stools and burning sensation. -Recommend bland diet and increased hydration. -Start Colace and Senna S for a couple of weeks to soften stools and stimulate bowel movement. -Consider Dulcolax suppository and Milk of Magnesia with prune juice for severe constipation. -Plan to engage with gastroenterologist for further evaluation.  Hemorrhoids Reports of swollen vein in rectum, burning, and occasional blood spotting after bowel movements. -Recommend Anusol HC suppositories twice daily to reduce inflammation and shrink hemorrhoids. -Advise cleaning area with witch hazel after each bowel movement. -Consider gastroenterologist evaluation if symptoms persist or worsen.  General Health Maintenance -Plan to administer flu shot in 2-3 weeks, once current symptoms have improved.         I discussed the assessment and treatment plan with the patient. The patient was provided an opportunity to ask questions and all were answered. The patient agreed with the plan and demonstrated an understanding of the instructions.   The patient was advised to call back or seek an in-person  evaluation if the symptoms worsen or if the condition fails to improve as anticipated.  Danise Edge, MD [facility] [phone] (phone) [phone] (fax)  [facility]

## 2023-03-01 NOTE — Assessment & Plan Note (Signed)
No recent exacerbation 

## 2023-03-01 NOTE — Telephone Encounter (Signed)
Pt called to request if pcp could send in nitrofurantoin-mcr 100 mg instead of amoxicillin because she has used the medication in the past with no side effects. Please call and advise pt.

## 2023-03-01 NOTE — Telephone Encounter (Signed)
Pt called stating that she would like to have her FMLA PPW filled out for 2-4hrs per week due to medical condition and Dr visits.

## 2023-03-01 NOTE — Assessment & Plan Note (Signed)
hgba1c acceptable, minimize simple carbs. Increase exercise as tolerated.  

## 2023-03-01 NOTE — Patient Instructions (Signed)
Encouraged increased hydration and fiber in diet. Daily probiotics. If bowels not moving can use Milk Of Magnesia 2 tbls po in 4 oz of warm prune juice by mouth every 2-3 days. If no results then repeat in 4 hours with  Dulcolax suppository pr, may repeat again in 4 more hours as needed. Seek care if symptoms worsen. Consider daily Miralax and/or Dulcolax if symptoms persist.

## 2023-03-01 NOTE — Assessment & Plan Note (Signed)
Hydrate and monitor 

## 2023-03-01 NOTE — Assessment & Plan Note (Signed)
Flared with covid since she was not eating well, hydrating or exercising. She noted pebble type BMs and straining was painful. She now notes a likely hemorrhoid which is protruding but no bleeding. Still straining and burns will try Anusol HC suppositories if no improvement will call GI

## 2023-03-01 NOTE — Assessment & Plan Note (Signed)
Hydrate and monitor, daily MVI, fatty acid supplements

## 2023-03-01 NOTE — Telephone Encounter (Signed)
Mychart sent to pt of the response from Dr. Maple Hudson. Will await a response from pt prior to placing new order.

## 2023-03-02 NOTE — Telephone Encounter (Signed)
Patient was out of work from 01/22/23-02/01/23.  She will need a note sent to her mychart to forward to HR that she will be starting reduce hours (1/2 days) tomorrow 03/03/23.

## 2023-03-02 NOTE — Telephone Encounter (Signed)
Pt called to advise that she originally told the doctor to write her out for 4 hours/ wk but she does not think that will be enough. Pt wanted to change the request to 1 week off. She plans to take her antibiotic tonight or tomorrow. She cannot be off without permission or she will get in trouble with work so she would like to speak with CMA as soon as possible to discuss form as they was not a lot of time to discuss form during her visit.

## 2023-03-03 NOTE — Telephone Encounter (Signed)
Pt called stating that the reduced schedule also needed to be included in the FMLA PPW to make sure it's job protected.

## 2023-03-03 NOTE — Telephone Encounter (Signed)
Spoke to patient yesterday and today and we are aware of these requests and changes. We are currently having her paperwork revised and Dr. Abner Greenspan will sign them tomorrow (03/03/23)

## 2023-03-03 NOTE — Telephone Encounter (Signed)
Called patient and she was given instructions per provider note. Patient stated she had already started taking the medication

## 2023-03-03 NOTE — Telephone Encounter (Signed)
Pt notified that letter is in [place].  She is working this week half days for 2 weeks.  She stated that the one week off was old and not needed.

## 2023-03-03 NOTE — Telephone Encounter (Signed)
ATC pt no vmail box.

## 2023-03-05 NOTE — Telephone Encounter (Signed)
Paperwork has been completed and faxed to [employer]. A copy of the paperwork has also been mailed to patient's residence.

## 2023-03-08 ENCOUNTER — Other Ambulatory Visit (HOSPITAL_COMMUNITY)
Admission: RE | Admit: 2023-03-08 | Discharge: 2023-03-08 | Disposition: A | Discharge status: Home / Self Care | Payer: 59 | Source: Ambulatory Visit | Attending: Oncology | Admitting: Oncology

## 2023-03-08 DIAGNOSIS — Z006 Encounter for examination for normal comparison and control in clinical research program: Secondary | ICD-10-CM | POA: Insufficient documentation

## 2023-03-15 ENCOUNTER — Telehealth: Payer: Self-pay | Admitting: Family Medicine

## 2023-03-15 NOTE — Telephone Encounter (Signed)
Pt called and stated that PCP is aware of her hostile work environment and her anxiety. She explained that her supervisor is the main cause of this and she wanted to know if PCP can send a letter to HR stating that the hostile work environment has exacerbated her anxiety to see if they can offer her help with this situation. Please call and advise pt.

## 2023-03-16 LAB — GENECONNECT MOLECULAR SCREEN

## 2023-03-16 LAB — HELIX MOLECULAR SCREEN: Genetic Analysis Overall Interpretation: NEGATIVE

## 2023-03-17 ENCOUNTER — Encounter: Payer: Self-pay | Admitting: Family Medicine

## 2023-03-18 ENCOUNTER — Other Ambulatory Visit (HOSPITAL_BASED_OUTPATIENT_CLINIC_OR_DEPARTMENT_OTHER): Payer: Self-pay

## 2023-03-18 DIAGNOSIS — R0683 Snoring: Secondary | ICD-10-CM

## 2023-03-18 MED ORDER — INFLUENZA VIRUS VACC SPLIT PF (FLUZONE) 0.5 ML IM SUSY
0.5000 mL | PREFILLED_SYRINGE | Freq: Once | INTRAMUSCULAR | 0 refills | Status: AC
  Filled 2023-03-18: qty 0.5, 1d supply, fill #0

## 2023-03-18 NOTE — Telephone Encounter (Signed)
Pt notified. Letter sent to MyChart and email

## 2023-04-13 ENCOUNTER — Telehealth: Payer: Self-pay | Admitting: Internal Medicine

## 2023-04-13 NOTE — Telephone Encounter (Signed)
Patient would like to know the results of her home sleep study.  [phone]

## 2023-04-16 NOTE — Telephone Encounter (Signed)
Lm x1 for patient. Did she have sleep study?

## 2023-04-26 ENCOUNTER — Telehealth: Payer: Self-pay | Admitting: Internal Medicine

## 2023-04-26 NOTE — Telephone Encounter (Signed)
Ok to send Zpak 250 mg, # 6, 2 today then one daily.  Her home sleep test again showed very mild sleep apnea- 7.5/hr with normal oxygen levels. We don't usually need to treat this, beyond encouraging people to sleep off the flat of the back. Sometimes people find otc "Sleep Tape" helpful.  It the sleep apnea is disturbing her enough, then we can talk about trying either CPAP or a fitted oral appliance- LMK if she wants to explore these.

## 2023-04-26 NOTE — Telephone Encounter (Signed)
I called and spoke with the pt  She started with HA 5 days ago  She got worse with congestion 3 days ago, sinus pressure, increased cough- clear sputum  She has not had any fever  She has started wheezing and having some increased SOB today  Her at home covid test was negative She is asking for zpack and also wants results of her sleep study- see other phone note  Thanks!  Allergies  Allergen Reactions   Aspirin Shortness Of Breath    "irritated stomach" Ibuprofen and Naproxen do not agree with her either   Codeine Hives, Shortness Of Breath and Other (See Comments)    Does not take percocet or hydrocodone; tylenol only   Doxycycline Other (See Comments)    Headache after one dose   Latex Shortness Of Breath, Itching and Other (See Comments)   Other Shortness Of Breath and Other (See Comments)    peanuts   Sulfa Antibiotics Hives and Shortness Of Breath   Sulfamethoxazole-Trimethoprim Shortness Of Breath   Sulfonamide Derivatives Shortness Of Breath and Itching   Sulphadimidine Sodium [Sulfamethazine Sodium] Shortness Of Breath    Added to food for preservative   Ciprofloxacin Other (See Comments)    REACTION: SOB, "Tightness in head" REACTION: SOB, "Tightness in head" Syncope    Azithromycin Other (See Comments)    Faintness   Carisoprodol    Clarithromycin     "Doesn't agree with me"   Epinephrine     Heart racing   Fenofibrate Other (See Comments)    Leg cramps, body aches    Metronidazole Other (See Comments)    "cannot tolerate"   Nsaids    Peanut (Diagnostic) Other (See Comments)    other   Penicillins     Doesn't remember reaction   Tolmetin    Omeprazole Palpitations

## 2023-04-26 NOTE — Telephone Encounter (Signed)
Patient states having symptoms of cough, mucus and chest congestion. Took Covid 19 test and it was negative. No available appointments at this time. Would like Z pak. Pharmacy is [address] Patient phone number is [phone].

## 2023-04-26 NOTE — Telephone Encounter (Signed)
Patient is returning missed call.

## 2023-04-26 NOTE — Telephone Encounter (Signed)
I called the pt and there was no answer- LMTCB. ?

## 2023-04-27 ENCOUNTER — Other Ambulatory Visit: Payer: Self-pay | Admitting: Internal Medicine

## 2023-04-27 MED ORDER — AZITHROMYCIN 250 MG PO TABS: 250.0000 mg | ORAL_TABLET | ORAL | 0 refills | Status: DC

## 2023-04-27 NOTE — Telephone Encounter (Signed)
I called and spoke with the pt and notified of response per Dr Maple Hudson  Pt verbalized understanding  Rx sent to pharmacy  Nothing further needed

## 2023-04-30 ENCOUNTER — Telehealth: Payer: Self-pay | Admitting: Internal Medicine

## 2023-04-30 ENCOUNTER — Ambulatory Visit: Payer: Self-pay | Admitting: Family Medicine

## 2023-04-30 ENCOUNTER — Ambulatory Visit (INDEPENDENT_AMBULATORY_CARE_PROVIDER_SITE_OTHER): Payer: 59 | Admitting: Medical

## 2023-04-30 ENCOUNTER — Ambulatory Visit (HOSPITAL_BASED_OUTPATIENT_CLINIC_OR_DEPARTMENT_OTHER)
Admission: RE | Admit: 2023-04-30 | Discharge: 2023-04-30 | Disposition: A | Discharge status: Home / Self Care | Payer: 59 | Source: Ambulatory Visit | Attending: Medical | Admitting: Medical

## 2023-04-30 VITALS — BP 134/76 | HR 100 | Temp 98.0°F | Resp 18 | Ht 59.0 in | Wt 113.0 lb

## 2023-04-30 DIAGNOSIS — R059 Cough, unspecified: Secondary | ICD-10-CM | POA: Diagnosis present

## 2023-04-30 DIAGNOSIS — J40 Bronchitis, not specified as acute or chronic: Secondary | ICD-10-CM

## 2023-04-30 DIAGNOSIS — J3489 Other specified disorders of nose and nasal sinuses: Secondary | ICD-10-CM

## 2023-04-30 MED ORDER — ALBUTEROL SULFATE HFA 108 (90 BASE) MCG/ACT IN AERS
2.0000 | INHALATION_SPRAY | Freq: Four times a day (QID) | RESPIRATORY_TRACT | 0 refills | Status: DC | PRN
Start: 2023-04-30 — End: 2023-08-09

## 2023-04-30 MED ORDER — AZELASTINE HCL 0.1 % NA SOLN: 2.0000 | Freq: Two times a day (BID) | NASAL | 12 refills | Status: DC

## 2023-04-30 MED ORDER — BENZONATATE 100 MG PO CAPS: 100.0000 mg | ORAL_CAPSULE | Freq: Three times a day (TID) | ORAL | 0 refills | Status: DC | PRN

## 2023-04-30 MED ORDER — METHYLPREDNISOLONE 4 MG PO TABS
ORAL_TABLET | ORAL | 0 refills | Status: DC
Start: 2023-04-30 — End: 2023-06-10

## 2023-04-30 NOTE — Telephone Encounter (Signed)
 I can see her in a held-spot if she wants to be seen for a cortisone shot. This kind of illness usually takes a week or so to clear, regardless. Certainly ok to use just half a vial of nebulizer solution at a time. If it still makes her too nervous, I can send a prescription for xopenex  nebulizer solution, which is milder.

## 2023-04-30 NOTE — Telephone Encounter (Signed)
 As of 4:10 pm , pt has not shown up to office for her 3:40 pm appt , [name] has been aware of sx prior pt no show

## 2023-04-30 NOTE — Patient Instructions (Signed)
 Bronchitis with left lower rib pain.  Symptoms for one week with progression from nasal congestion and headache to chest symptoms and productive cough. Initially yellow sputum, now clear. No fever or chills. Left-sided chest discomfort with cough. -Continue Azithromycin  started yesterday. -Order chest x-ray to rule out pneumonia. -If pneumonia is present on x-ray, consider adding Augmentin .  Allergic Rhinitis but also sinus pressure. possible sinus infection. Nasal congestion and postnasal drainage contributing to symptoms. -Prescribe Astelin , two sprays each nostril twice daily.  Asthma Wheezing and shortness of breath with exercise and cold exposure. History of allergy-induced asthma. -Prescribe 6-day taper dose of Medrol . -Continue Albuterol  inhaler as needed. -Advise patient to avoid exercise until symptoms improve.  Follow-up 7 days or sooner if needed.  Patient to check results of chest x-ray on MyChart.

## 2023-04-30 NOTE — Telephone Encounter (Signed)
 HST read on 12/20. Pt has mild obstructive sleep apnea. ATC pt no answert, lvmm for pt to give the office a call back

## 2023-04-30 NOTE — Telephone Encounter (Signed)
 Patient still has a cough. She is on day 2 of the zpak and has started using a nebulizer. Patient is not sure if she needs a cortisone shot. To help with the cough. She doesn't want it to turn into pneumonia or bronchitis.

## 2023-04-30 NOTE — Telephone Encounter (Signed)
 Atc pt, lvm asking for a return call back.

## 2023-04-30 NOTE — Telephone Encounter (Signed)
Pt arrived and seen.

## 2023-04-30 NOTE — Telephone Encounter (Signed)
 Chief Complaint: cough Symptoms: cough that feels like she has fluid in chest, wheezing, pain on left side of chest up under rib Frequency: constant for about a week Disposition: [] ED /[] Urgent Care (no appt availability in office) / [] Appointment(In office/virtual)/ []  [facility]/ [] Home Care/ [] Refused Recommended Disposition /[] [facility]/ []  Follow-up with PCP Additional Notes: Patient stated she has been experiencing a cough for a little over a week and was seen by her Pulmonologist, who prescribed her a z-pack and albuterol . Patient states that she now feels like the cough has moved into her chest and she feels fluid in her chest, is experiencing wheezing and chest pain on her left side. Patient also states she feels as though she looks paler than normal. Advised patient she needs to be seen and evaluated by a provider.    Copied from CRM [id number]. Topic: Clinical - Red Word Triage >> Apr 30, 2023  3:26 PM Melissa C wrote: Red Word that prompted transfer to Nurse Triage: patient has had a dry cough, fluid in chest, very congested and wheezy. Has pain in her left side. Concerned about pneumonia Reason for Disposition  [1] MILD difficulty breathing (e.g., minimal/no SOB at rest, SOB with walking, pulse <100) AND [2] still present when not coughing  Answer Assessment - Initial Assessment Questions 1. ONSET: When did the cough begin?      About a week 2. SEVERITY: How bad is the cough today?      Cough with chest pain and wheezing 3. SPUTUM: Describe the color of your sputum (none, dry cough; clear, white, yellow, green)     clear 4. HEMOPTYSIS: Are you coughing up any blood? If so ask: How much? (flecks, streaks, tablespoons, etc.)     no 5. DIFFICULTY BREATHING: Are you having difficulty breathing? If Yes, ask: How bad is it? (e.g., mild, moderate, severe)    - MILD: No SOB at rest, mild SOB with walking, speaks normally in sentences, can lie down, no  retractions, pulse < 100.    - MODERATE: SOB at rest, SOB with minimal exertion and prefers to sit, cannot lie down flat, speaks in phrases, mild retractions, audible wheezing, pulse 100-120.    - SEVERE: Very SOB at rest, speaks in single words, struggling to breathe, sitting hunched forward, retractions, pulse > 120      Mildly short of breath and can't breathe out of nose 6. FEVER: Do you have a fever? If Yes, ask: What is your temperature, how was it measured, and when did it start?    No 7. CARDIAC HISTORY: Do you have any history of heart disease? (e.g., heart attack, congestive heart failure)      No 8. LUNG HISTORY: Do you have any history of lung disease?  (e.g., pulmonary embolus, asthma, emphysema)     asthma 9. PE RISK FACTORS: Do you have a history of blood clots? (or: recent major surgery, recent prolonged travel, bedridden)     No 10. OTHER SYMPTOMS: Do you have any other symptoms? (e.g., runny nose, wheezing, chest pain)       Wheezing, chest pain on left side, runny nose 1  Protocols used: Cough - Acute Productive-A-AH

## 2023-04-30 NOTE — Progress Notes (Signed)
 Subjective:    Patient ID: [name], female    DOB: [birth year], 58 y.o.   MRN: [id number]  HPI  Discussed the use of AI scribe software for clinical note transcription with the patient, who gave verbal consent to proceed.  History of Present Illness   The patient, with a history of allergy-induced asthma, presented with symptoms of a respiratory infection that started approximately a week ago. The initial symptoms were nasal congestion and a severe headache, which led to the cancellation of a planned trip due to weakness and fatigue. The patient reported a heavy, tight feeling in the sinus area and was producing thick, yellow mucus from the nose, a symptom they had not experienced before.  Over the course of the week, the patient's symptoms seemed to progress into the chest. They reported a fluid-like cough and could hear a wheezing sound when lying down. The mucus produced during coughing was initially clear yellow, but has since become clear. The patient reported feeling slightly warm but denied having a fever or chills. They also reported aches, particularly in the left lower rib area, which felt sore when coughing. This discomfort led the patient to stop exercising due to shortness of breath.  The patient had been in contact with their pulmonologist's office, describing their symptoms over the phone. The pulmonologist prescribed a Z-Pak (azithromycin ), which the patient started the day before the consultation. The patient also mentioned having an albuterol  inhaler at home, but expressed a need for a new one due to the current illness. They also reported a history of wheezing during previous illnesses and allergies.  The patient also reported a pain in the left rib area, which was sore on and off, particularly when coughing.  The patient was concerned about these symptoms and wanted to ensure they were not developing pneumonia. They were scheduled to see the pulmonologist in person in  March.         She was triaged earlier and offer appt late in the day. Our lab was closed by time initial interview and assessment was done. Arrived late but decided would see as approaching weekend. Triage note below. Pt denies any left side cardiac like chest pain on review/discussion. She is aware if she were to get any constant pain to be seen in ED.  Additional Notes: Patient stated she has been experiencing a cough for a little over a week and was seen by her Pulmonologist, who prescribed her a z-pack and albuterol . Patient states that she now feels like the cough has moved into her chest and she feels fluid in her chest, is experiencing wheezing and chest pain on her left side. Patient also states she feels as though she looks paler than normal. Advised patient she needs to be seen and evaluated by a provider.    Review of Systems  Constitutional:  Negative for chills, fatigue and fever.  HENT:  Positive for congestion, sinus pressure and sinus pain.   Respiratory:  Positive for cough and wheezing. Negative for chest tightness.   Cardiovascular:  Negative for chest pain and palpitations.  Gastrointestinal:  Negative for abdominal pain.  Genitourinary:  Negative for dysuria and frequency.  Musculoskeletal:  Negative for back pain.       No leg pain.  Neurological:  Negative for dizziness, weakness, light-headedness and headaches.  Hematological:  Negative for adenopathy. Does not bruise/bleed easily.  Psychiatric/Behavioral:  Negative for behavioral problems and confusion.     Past Medical History:  Diagnosis Date  Allergic rhinitis    Anemia    Anxiety    Asthma    Contact dermatitis and eczema    Diverticulitis    CT Scan   Esophageal reflux    H. pylori infection    Hyperlipidemia, mixed    IBS (irritable bowel syndrome)    IC (interstitial cystitis)    Internal hemorrhoids    Lumbago    Mitral valve disorders(424.0)    Osteoarthritis 07/08/2015   Post concussive  syndrome    Seasonal and perennial allergic rhinitis 01/20/2015     Social History   Socioeconomic History   Marital status: Married    Spouse name: Not on file   Number of children: 2   Years of education: Masters   Highest education level: Master's degree (e.g., MA, MS, MEng, MEd, MSW, MBA)  Occupational History   Occupation: Clinical Cytogeneticist  Tobacco Use   Smoking status: Never    Passive exposure: Past   Smokeless tobacco: Never  Vaping Use   Vaping status: Never Used  Substance and Sexual Activity   Alcohol use: Not Currently   Drug use: No   Sexual activity: Yes    Comment: lives with husband and son, work with [employer] as a psychologist, educational, aoivd gluten, dairy  Other Topics Concern   Not on file  Social History Narrative   Lives at home home with husband and son.   Right-handed.   1 cup caffeine daily.   Social Drivers of Corporate Investment Banker Strain: Low Risk  (10/13/2022)   Overall Financial Resource Strain (CARDIA)    Difficulty of Paying Living Expenses: Not very hard  Food Insecurity: Patient Declined (10/13/2022)   Hunger Vital Sign    Worried About Running Out of Food in the Last Year: Patient declined    Ran Out of Food in the Last Year: Patient declined  Transportation Needs: Unknown (10/13/2022)   PRAPARE - Administrator, Civil Service (Medical): No    Lack of Transportation (Non-Medical): Not on file  Physical Activity: Sufficiently Active (10/13/2022)   Exercise Vital Sign    Days of Exercise per Week: 5 days    Minutes of Exercise per Session: 60 min  Stress: No Stress Concern Present (10/13/2022)   [employer] of Occupational Health - Occupational Stress Questionnaire    Feeling of Stress : Only a little  Social Connections: Unknown (10/13/2022)   Social Connection and Isolation Panel [NHANES]    Frequency of Communication with Friends and Family: Three times a week    Frequency of Social Gatherings with Friends and  Family: Patient declined    Attends Religious Services: Patient declined    Active Member of Clubs or Organizations: No    Attends Engineer, Structural: Not on file    Marital Status: Patient declined  Intimate Partner Violence: Not on file    Past Surgical History:  Procedure Laterality Date   BARTHOLIN CYST MARSUPIALIZATION N/A 12/18/2013   Procedure: BARTHOLIN CYST MARSUPIALIZATION WITH BIOPSY;  Surgeon: Charlie JINNY Flowers, MD;  Location: [facility];  Service: Gynecology;  Laterality: N/A;   CESAREAN SECTION     DILITATION & CURRETTAGE/HYSTROSCOPY WITH NOVASURE ABLATION N/A 10/14/2012   Procedure: DILATATION & CURETTAGE/HYSTEROSCOPY WITH NOVASURE ABLATION;  Surgeon: Charlie JINNY Flowers, MD;  Location: [facility];  Service: Gynecology;  Laterality: N/A;   MOUTH SURGERY     SINUS SURGERY WITH INSTATRAK      Family History  Problem Relation Age of  Onset   Diabetes Mother    Hypertension Mother    Heart attack Mother        MI at age 87   Alcohol abuse Mother        smoker   Heart disease Mother    Dementia Mother    Hypertension Father    Hyperlipidemia Father    Heart attack Father    Lung cancer Father        smoking hx; dx > 50   Colon polyps Sister    Mental illness Sister        anxiety from 9/11 survivors   Varicose Veins Sister    Osteoporosis Sister    Other Sister        hypoglycemia   Varicose Veins Sister    Rheum arthritis Sister        lupus   Pleurisy Sister    Fibromyalgia Sister    Mental retardation Sister        depression   Leukemia Brother        acute; dx 39s   GER disease Son    Colon cancer Paternal Uncle        dx unknown age   Dementia Maternal Grandmother    Heart disease Maternal Grandfather        mi   Cancer Paternal Grandmother        breast/ovairan before age 55   Alcohol abuse Other        Family history   Breast cancer Neg Hx     Allergies  Allergen Reactions   Aspirin Shortness Of Breath    irritated stomach Ibuprofen  and Naproxen do not agree with her either   Codeine Hives, Shortness Of Breath and Other (See Comments)    Does not take percocet or hydrocodone ; tylenol  only   Doxycycline  Other (See Comments)    Headache after one dose   Latex Shortness Of Breath, Itching and Other (See Comments)   Other Shortness Of Breath and Other (See Comments)    peanuts   Sulfa Antibiotics Hives and Shortness Of Breath   Sulfamethoxazole-Trimethoprim Shortness Of Breath   Sulfonamide Derivatives Shortness Of Breath and Itching   Sulphadimidine Sodium [Sulfamethazine Sodium] Shortness Of Breath    Added to food for preservative   Ciprofloxacin  Other (See Comments)    REACTION: SOB, Tightness in head REACTION: SOB, Tightness in head Syncope    Azithromycin  Other (See Comments)    Faintness   Carisoprodol    Clarithromycin     Doesn't agree with me   Epinephrine     Heart racing   Fenofibrate  Other (See Comments)    Leg cramps, body aches    Metronidazole  Other (See Comments)    cannot tolerate   Nsaids    Peanut (Diagnostic) Other (See Comments)    other   Penicillins     Doesn't remember reaction   Tolmetin    Omeprazole  Palpitations    Current Outpatient Medications on File Prior to Visit  Medication Sig Dispense Refill   azithromycin  (ZITHROMAX ) 250 MG tablet Take 1 tablet (250 mg total) by mouth as directed. 6 tablet 0   fluticasone  (FLONASE ) 50 MCG/ACT nasal spray Place 2 sprays into both nostrils daily. 16 g 1   hydrocortisone  (ANUSOL -HC) 25 MG suppository Place 1 suppository (25 mg total) rectally 2 (two) times daily as needed for hemorrhoids or anal itching. 20 suppository 1   Multiple Vitamins-Minerals (MULTIVITAMIN ADULT PO) Take 1 tablet by mouth  daily.     OVER THE COUNTER MEDICATION Calcium     OVER THE COUNTER MEDICATION Omega 3 2100     Probiotic Product (PROBIOTIC DAILY PO) Take 1 tablet by mouth daily.     sodium chloride  (OCEAN) 0.65 % SOLN nasal spray Place 1 spray into  both nostrils as needed for congestion.     Current Facility-Administered Medications on File Prior to Visit  Medication Dose Route Frequency Provider Last Rate Last Admin   methylPREDNISolone  acetate (DEPO-MEDROL ) injection 80 mg  80 mg Intramuscular Once Young, Clinton D, MD        BP 134/76   Pulse 100   Temp 98 F (36.7 C)   Resp 18   Ht 4' 11 (1.499 m)   Wt 113 lb (51.3 kg)   LMP 06/30/2016   SpO2 99%   BMI 22.82 kg/m        Objective:   Physical Exam  General Mental Status- Alert. General Appearance- Not in acute distress.   Skin General: Color- Normal Color. Moisture- Normal Moisture.  Neck No carotid bruits. No JVD. No tracheal deviation.  Chest and Lung Exam Auscultation: Breath Sounds:-clear even and unlabored but mid shallow.  Cardiovascular Auscultation:Rythm- RRR Murmurs & Other Heart Sounds:Auscultation of the heart reveals- No Murmurs.  Abdomen Inspection:-Inspeection Normal. Palpation/Percussion:Note:No mass. Palpation and Percussion of the abdomen reveal- Non Tender, Non Distended + BS, no rebound or guarding.  Neurologic Cranial Nerve exam:- CN III-XII intact(No nystagmus), symmetric smile. Strength:- 5/5 equal and symmetric strength both upper and lower extremities.   Calfs- symmetric, no pedal edema. Negative homans signs.    Assessment & Plan:   Patient Instructions  Bronchitis with left lower rib pain.  Symptoms for one week with progression from nasal congestion and headache to chest symptoms and productive cough. Initially yellow sputum, now clear. No fever or chills. Left-sided chest discomfort with cough. -Continue Azithromycin  started yesterday. -Order chest x-ray to rule out pneumonia. -If pneumonia is present on x-ray, consider adding Augmentin .  Allergic Rhinitis but also sinus pressure. possible sinus infection. Nasal congestion and postnasal drainage contributing to symptoms. -Prescribe Astelin , two sprays each nostril  twice daily.  Asthma Wheezing and shortness of breath with exercise and cold exposure. History of allergy-induced asthma. -Prescribe 6-day taper dose of Medrol . -Continue Albuterol  inhaler as needed. -Advise patient to avoid exercise until symptoms improve.  Follow-up 7 days or sooner if needed.  Patient to check results of chest x-ray on MyChart.   Time spent with patient today was 41 minutes which consisted of chart review, discussing diagnosis, work up treatment and documentation.

## 2023-04-30 NOTE — Telephone Encounter (Signed)
 Pt wants to know if she needs to continue the cortisone shot. Pt states the zpack and albuterol  neb is helping and she states it is opening up her airways, but the albuterol  nebs give her tremors. Pt wants to know what to do further, if anything is recommended. Pt states she is only doing half a vile for the neb as needed. Pt states she is having a lot of sinus and chest congestion, the sinus congestion is making her head hurt. Pt states she only really has chest congestion in the morning when she wakes up, and it moves to her head throughout the day.  Pt states she has only ben taking zpack and neb treatments for 2 days. Please advise.

## 2023-05-03 NOTE — Telephone Encounter (Signed)
 I called and spoke with the pt and notified of response per Dr Maple Hudson  She verbalized understanding  Declined ov for steroid inj  She says her PCP started her on medrol and she is starting this today  Will call back for appt if not improving

## 2023-05-03 NOTE — Telephone Encounter (Signed)
 Pt has been advised on sleep study results, she verbalized understanding nfn

## 2023-06-09 NOTE — Assessment & Plan Note (Signed)
Encouraged good sleep hygiene such as dark, quiet room. No blue/green glowing lights such as computer screens in bedroom. No alcohol or stimulants in evening. Cut down on caffeine as able. Regular exercise is helpful but not just prior to bed time.

## 2023-06-09 NOTE — Assessment & Plan Note (Signed)
Hydrate and monitor

## 2023-06-09 NOTE — Assessment & Plan Note (Signed)
She has been under a great deal of stress at work but she is managing without medications at this time.

## 2023-06-09 NOTE — Assessment & Plan Note (Signed)
Encourage heart healthy diet such as MIND or DASH diet, increase exercise, avoid trans fats, simple carbohydrates and processed foods, consider a krill or fish or flaxseed oil cap daily.

## 2023-06-09 NOTE — Assessment & Plan Note (Signed)
hgba1c acceptable, minimize simple carbs. Increase exercise as tolerated.

## 2023-06-09 NOTE — Progress Notes (Unsigned)
MyChart Video Visit    Virtual Visit via Video Note   This patient is at least at moderate risk for complications without adequate follow up. This format is felt to be most appropriate for this patient at this time. Physical exam was limited by quality of the video and audio technology used for the visit. Juanetta, CMA was able to get the patient set up on a video visit.  Patient location: Home Patient and provider in visit Provider location: Office  I discussed the limitations of evaluation and management by telemedicine and the availability of in person appointments. The patient expressed understanding and agreed to proceed.  Visit Date: 06/10/2023  Today's healthcare provider: Danise Edge, MD     Subjective:    Patient ID: [name], female    DOB: 1966/03/15, 58 y.o.   MRN: [id number]  No chief complaint on file.   HPI Discussed the use of AI scribe software for clinical note transcription with the patient, who gave verbal consent to proceed.  History of Present Illness            Past Medical History:  Diagnosis Date  . Allergic rhinitis   . Anemia   . Anxiety   . Asthma   . Contact dermatitis and eczema   . Diverticulitis    CT Scan  . Esophageal reflux   . H. pylori infection   . Hyperlipidemia, mixed   . IBS (irritable bowel syndrome)   . IC (interstitial cystitis)   . Internal hemorrhoids   . Lumbago   . Mitral valve disorders(424.0)   . Osteoarthritis 07/08/2015  . Post concussive syndrome   . Seasonal and perennial allergic rhinitis 01/20/2015    Past Surgical History:  Procedure Laterality Date  . BARTHOLIN CYST MARSUPIALIZATION N/A 12/18/2013   Procedure: BARTHOLIN CYST MARSUPIALIZATION WITH BIOPSY;  Surgeon: Lenoard Aden, MD;  Location: [facility];  Service: Gynecology;  Laterality: N/A;  . CESAREAN SECTION    . DILITATION & CURRETTAGE/HYSTROSCOPY WITH NOVASURE ABLATION N/A 10/14/2012   Procedure: DILATATION & CURETTAGE/HYSTEROSCOPY  WITH NOVASURE ABLATION;  Surgeon: Lenoard Aden, MD;  Location: [facility];  Service: Gynecology;  Laterality: N/A;  . MOUTH SURGERY    . SINUS SURGERY WITH INSTATRAK      Family History  Problem Relation Age of Onset  . Diabetes Mother   . Hypertension Mother   . Heart attack Mother        MI at age 74  . Alcohol abuse Mother        smoker  . Heart disease Mother   . Dementia Mother   . Hypertension Father   . Hyperlipidemia Father   . Heart attack Father   . Lung cancer Father        smoking hx; dx > 50  . Colon polyps Sister   . Mental illness Sister        anxiety from 9/11 survivors  . Varicose Veins Sister   . Osteoporosis Sister   . Other Sister        hypoglycemia  . Varicose Veins Sister   . Rheum arthritis Sister        lupus  . Pleurisy Sister   . Fibromyalgia Sister   . Mental retardation Sister        depression  . Leukemia Brother        acute; dx 15s  . GER disease Son   . Colon cancer Paternal Uncle  dx unknown age  . Dementia Maternal Grandmother   . Heart disease Maternal Grandfather        mi  . Cancer Paternal Grandmother        breast/ovairan before age 18  . Alcohol abuse Other        Family history  . Breast cancer Neg Hx     Social History   Socioeconomic History  . Marital status: Married    Spouse name: Not on file  . Number of children: 2  . Years of education: Masters  . Highest education level: Master's degree (e.g., MA, MS, MEng, MEd, MSW, MBA)  Occupational History  . Occupation: [employer]  Tobacco Use  . Smoking status: Never    Passive exposure: Past  . Smokeless tobacco: Never  Vaping Use  . Vaping status: Never Used  Substance and Sexual Activity  . Alcohol use: Not Currently  . Drug use: No  . Sexual activity: Yes    Comment: lives with husband and son, work with [employer] as a Psychologist, educational, aoivd gluten, dairy  Other Topics Concern  . Not on file  Social History Narrative   Lives at home  home with husband and son.   Right-handed.   1 cup caffeine daily.   Social Drivers of Health   Financial Resource Strain: Low Risk  (10/13/2022)   Overall Financial Resource Strain (CARDIA)   . Difficulty of Paying Living Expenses: Not very hard  Food Insecurity: Patient Declined (10/13/2022)   Hunger Vital Sign   . Worried About Programme researcher, broadcasting/film/video in the Last Year: Patient declined   . Ran Out of Food in the Last Year: Patient declined  Transportation Needs: Unknown (10/13/2022)   PRAPARE - Transportation   . Lack of Transportation (Medical): No   . Lack of Transportation (Non-Medical): Not on file  Physical Activity: Sufficiently Active (10/13/2022)   Exercise Vital Sign   . Days of Exercise per Week: 5 days   . Minutes of Exercise per Session: 60 min  Stress: No Stress Concern Present (10/13/2022)   [employer] of Occupational Health - Occupational Stress Questionnaire   . Feeling of Stress : Only a little  Social Connections: Unknown (10/13/2022)   Social Connection and Isolation Panel [NHANES]   . Frequency of Communication with Friends and Family: Three times a week   . Frequency of Social Gatherings with Friends and Family: Patient declined   . Attends Religious Services: Patient declined   . Active Member of Clubs or Organizations: No   . Attends Banker Meetings: Not on file   . Marital Status: Patient declined  Intimate Partner Violence: Not on file    Outpatient Medications Prior to Visit  Medication Sig Dispense Refill  . albuterol (VENTOLIN HFA) 108 (90 Base) MCG/ACT inhaler Inhale 2 puffs into the lungs every 6 (six) hours as needed. 18 g 0  . azelastine (ASTELIN) 0.1 % nasal spray Place 2 sprays into both nostrils 2 (two) times daily. Use in each nostril as directed 30 mL 12  . azithromycin (ZITHROMAX) 250 MG tablet Take 1 tablet (250 mg total) by mouth as directed. 6 tablet 0  . benzonatate (TESSALON) 100 MG capsule Take 1 capsule (100 mg total)  by mouth 3 (three) times daily as needed for cough. 30 capsule 0  . fluticasone (FLONASE) 50 MCG/ACT nasal spray Place 2 sprays into both nostrils daily. 16 g 1  . hydrocortisone (ANUSOL-HC) 25 MG suppository Place 1 suppository (  25 mg total) rectally 2 (two) times daily as needed for hemorrhoids or anal itching. 20 suppository 1  . methylPREDNISolone (MEDROL) 4 MG tablet Standard 6 day taper dose 21 tablet 0  . Multiple Vitamins-Minerals (MULTIVITAMIN ADULT PO) Take 1 tablet by mouth daily.    Marland Kitchen OVER THE COUNTER MEDICATION Calcium    . OVER THE COUNTER MEDICATION Omega 3 2100    . Probiotic Product (PROBIOTIC DAILY PO) Take 1 tablet by mouth daily.    . sodium chloride (OCEAN) 0.65 % SOLN nasal spray Place 1 spray into both nostrils as needed for congestion.     Facility-Administered Medications Prior to Visit  Medication Dose Route Frequency Provider Last Rate Last Admin  . methylPREDNISolone acetate (DEPO-MEDROL) injection 80 mg  80 mg Intramuscular Once Waymon Budge, MD        Allergies  Allergen Reactions  . Aspirin Shortness Of Breath    "irritated stomach" Ibuprofen and Naproxen do not agree with her either  . Codeine Hives, Shortness Of Breath and Other (See Comments)    Does not take percocet or hydrocodone; tylenol only  . Doxycycline Other (See Comments)    Headache after one dose  . Latex Shortness Of Breath, Itching and Other (See Comments)  . Other Shortness Of Breath and Other (See Comments)    peanuts  . Sulfa Antibiotics Hives and Shortness Of Breath  . Sulfamethoxazole-Trimethoprim Shortness Of Breath  . Sulfonamide Derivatives Shortness Of Breath and Itching  . Sulphadimidine Sodium [Sulfamethazine Sodium] Shortness Of Breath    Added to food for preservative  . Ciprofloxacin Other (See Comments)    REACTION: SOB, "Tightness in head" REACTION: SOB, "Tightness in head" Syncope   . Azithromycin Other (See Comments)    Faintness  . Carisoprodol   .  Clarithromycin     "Doesn't agree with me"  . Epinephrine     Heart racing  . Fenofibrate Other (See Comments)    Leg cramps, body aches   . Metronidazole Other (See Comments)    "cannot tolerate"  . Nsaids   . Peanut (Diagnostic) Other (See Comments)    other  . Penicillins     Doesn't remember reaction  . Tolmetin   . Omeprazole Palpitations    Review of Systems  Constitutional:  Negative for fever and malaise/fatigue.  HENT:  Negative for congestion.   Eyes:  Negative for blurred vision.  Respiratory:  Negative for shortness of breath.   Cardiovascular:  Negative for chest pain, palpitations and leg swelling.  Gastrointestinal:  Negative for abdominal pain, blood in stool and nausea.  Genitourinary:  Negative for dysuria and frequency.  Musculoskeletal:  Negative for falls.  Skin:  Negative for rash.  Neurological:  Negative for dizziness, loss of consciousness and headaches.  Endo/Heme/Allergies:  Negative for environmental allergies.  Psychiatric/Behavioral:  Negative for depression. The patient is not nervous/anxious.        Objective:    Physical Exam Constitutional:      General: She is not in acute distress.    Appearance: Normal appearance. She is not ill-appearing or toxic-appearing.  HENT:     Head: Normocephalic and atraumatic.     Right Ear: External ear normal.     Left Ear: External ear normal.     Nose: Nose normal.  Eyes:     General:        Right eye: No discharge.        Left eye: No discharge.  Pulmonary:  Effort: Pulmonary effort is normal.  Skin:    Findings: No rash.  Neurological:     Mental Status: She is alert and oriented to person, place, and time.  Psychiatric:        Behavior: Behavior normal.   LMP 06/30/2016  Wt Readings from Last 3 Encounters:  04/30/23 113 lb (51.3 kg)  03/01/23 108 lb (49 kg)  02/15/23 110 lb (49.9 kg)       Assessment & Plan:  Anxiety and depression Assessment & Plan: She has been under a  great deal of stress at work but she is managing without medications at this time.   Hyperglycemia Assessment & Plan: hgba1c acceptable, minimize simple carbs. Increase exercise as tolerated.    Hyperlipidemia, mild Assessment & Plan: Encourage heart healthy diet such as MIND or DASH diet, increase exercise, avoid trans fats, simple carbohydrates and processed foods, consider a krill or fish or flaxseed oil cap daily.    Insomnia, unspecified type Assessment & Plan: Encouraged good sleep hygiene such as dark, quiet room. No blue/green glowing lights such as computer screens in bedroom. No alcohol or stimulants in evening. Cut down on caffeine as able. Regular exercise is helpful but not just prior to bed time.    renal insufficiency Assessment & Plan: Hydrate and monitor       Assessment and Plan              I discussed the assessment and treatment plan with the patient. The patient was provided an opportunity to ask questions and all were answered. The patient agreed with the plan and demonstrated an understanding of the instructions.   The patient was advised to call back or seek an in-person evaluation if the symptoms worsen or if the condition fails to improve as anticipated.  Danise Edge, MD [facility] [phone] (phone) [phone] (fax)  [facility]

## 2023-06-10 ENCOUNTER — Telehealth (INDEPENDENT_AMBULATORY_CARE_PROVIDER_SITE_OTHER): Payer: 59 | Admitting: Family Medicine

## 2023-06-10 ENCOUNTER — Telehealth: Payer: 59 | Admitting: Physician Assistant

## 2023-06-10 VITALS — BP 128/74 | HR 87 | Temp 97.9°F | Ht 59.0 in | Wt 110.0 lb

## 2023-06-10 DIAGNOSIS — G4700 Insomnia, unspecified: Secondary | ICD-10-CM | POA: Diagnosis not present

## 2023-06-10 DIAGNOSIS — N186 End stage renal disease: Secondary | ICD-10-CM

## 2023-06-10 DIAGNOSIS — F419 Anxiety disorder, unspecified: Secondary | ICD-10-CM

## 2023-06-10 DIAGNOSIS — E785 Hyperlipidemia, unspecified: Secondary | ICD-10-CM | POA: Diagnosis not present

## 2023-06-10 DIAGNOSIS — B9689 Other specified bacterial agents as the cause of diseases classified elsewhere: Secondary | ICD-10-CM

## 2023-06-10 DIAGNOSIS — K2100 Gastro-esophageal reflux disease with esophagitis, without bleeding: Secondary | ICD-10-CM

## 2023-06-10 DIAGNOSIS — R739 Hyperglycemia, unspecified: Secondary | ICD-10-CM

## 2023-06-10 DIAGNOSIS — F32A Depression, unspecified: Secondary | ICD-10-CM

## 2023-06-10 MED ORDER — AZITHROMYCIN 250 MG PO TABS
ORAL_TABLET | ORAL | 0 refills | Status: AC
Start: 2023-06-10 — End: 2023-06-15

## 2023-06-10 NOTE — Progress Notes (Signed)
Patient canceled e-visit

## 2023-06-11 ENCOUNTER — Telehealth: Payer: Self-pay | Admitting: Emergency Medicine

## 2023-06-11 NOTE — Telephone Encounter (Signed)
Copied from CRM [id number]. Topic: General - Other >> Jun 11, 2023  9:04 AM Irine Seal wrote: Reason for CRM: The patient was prescribed a Z-Pak during her virtual visit yesterday. She called back requesting the same 2-in-1 antibiotic she was previously given, stating it was very helpful. She would like to receive it at a lower dose but could not recall the name of the medication. After reviewing her chart, we were unable to determine which antibiotic she was referring to.  She also stated she is aware she cannot do the cologard until next year so she is requesting a home stool test that can be mailed to her or picked up in office

## 2023-06-13 ENCOUNTER — Other Ambulatory Visit: Payer: Self-pay | Admitting: Family Medicine

## 2023-06-15 NOTE — Telephone Encounter (Signed)
Patient is returning phone call from office I relayed the message to patient patient would still like a call back regarding the colon screening she wants to know if this is a at home test and would it be mailed to her are does she have to pick it up

## 2023-06-15 NOTE — Telephone Encounter (Signed)
Called and spoke with patient. She sad she would like Ifob mailed to her home.

## 2023-06-24 ENCOUNTER — Telehealth: Payer: Self-pay | Admitting: Emergency Medicine

## 2023-06-24 NOTE — Telephone Encounter (Signed)
 Copied from CRM [id number]. Topic: Clinical - Request for Lab/Test Order >> Jun 24, 2023  4:07 PM [name] wrote: Reason for CRM: Patient would like to add LPA test to her lab orders, to check her cholesterol.

## 2023-06-24 NOTE — Telephone Encounter (Signed)
 Copied from CRM [id number]. Topic: Clinical - Medical Advice >> Jun 24, 2023  4:05 PM Isabell A wrote: Reason for CRM: Patient would like to know - due to her age, if she can get the measle booster. Also, would like to confirm if she can get the pneumonia vaccine.

## 2023-06-28 ENCOUNTER — Other Ambulatory Visit: Payer: Self-pay | Admitting: Emergency Medicine

## 2023-06-28 DIAGNOSIS — E785 Hyperlipidemia, unspecified: Secondary | ICD-10-CM

## 2023-06-28 NOTE — Telephone Encounter (Signed)
 LPA has been added

## 2023-06-30 NOTE — Telephone Encounter (Signed)
 Called and spoke with patient. She has an upcoming visit (07/12/23) and she will discuss vaccines with provider at this time.

## 2023-07-05 ENCOUNTER — Other Ambulatory Visit: Payer: Self-pay | Admitting: Obstetrics and Gynecology

## 2023-07-05 ENCOUNTER — Other Ambulatory Visit (INDEPENDENT_AMBULATORY_CARE_PROVIDER_SITE_OTHER)

## 2023-07-05 ENCOUNTER — Encounter: Payer: Self-pay | Admitting: Family Medicine

## 2023-07-05 DIAGNOSIS — E785 Hyperlipidemia, unspecified: Secondary | ICD-10-CM | POA: Diagnosis not present

## 2023-07-05 DIAGNOSIS — F419 Anxiety disorder, unspecified: Secondary | ICD-10-CM

## 2023-07-05 DIAGNOSIS — N186 End stage renal disease: Secondary | ICD-10-CM

## 2023-07-05 DIAGNOSIS — Z1231 Encounter for screening mammogram for malignant neoplasm of breast: Secondary | ICD-10-CM

## 2023-07-05 DIAGNOSIS — K2100 Gastro-esophageal reflux disease with esophagitis, without bleeding: Secondary | ICD-10-CM

## 2023-07-05 DIAGNOSIS — F32A Depression, unspecified: Secondary | ICD-10-CM

## 2023-07-05 DIAGNOSIS — R739 Hyperglycemia, unspecified: Secondary | ICD-10-CM | POA: Diagnosis not present

## 2023-07-05 LAB — CBC WITH DIFFERENTIAL/PLATELET
Basophils Absolute: 0.1 10*3/uL (ref 0.0–0.1)
Basophils Relative: 1.6 % (ref 0.0–3.0)
Eosinophils Absolute: 0.1 10*3/uL (ref 0.0–0.7)
Eosinophils Relative: 3.7 % (ref 0.0–5.0)
HCT: 41.4 % (ref 36.0–46.0)
Hemoglobin: 14 g/dL (ref 12.0–15.0)
Lymphocytes Relative: 49.5 % — ABNORMAL HIGH (ref 12.0–46.0)
Lymphs Abs: 1.7 10*3/uL (ref 0.7–4.0)
MCHC: 33.8 g/dL (ref 30.0–36.0)
MCV: 99.4 fl (ref 78.0–100.0)
Monocytes Absolute: 0.4 10*3/uL (ref 0.1–1.0)
Monocytes Relative: 10.6 % (ref 3.0–12.0)
Neutro Abs: 1.2 10*3/uL — ABNORMAL LOW (ref 1.4–7.7)
Neutrophils Relative %: 34.6 % — ABNORMAL LOW (ref 43.0–77.0)
Platelets: 290 10*3/uL (ref 150.0–400.0)
RBC: 4.17 Mil/uL (ref 3.87–5.11)
RDW: 12.4 % (ref 11.5–15.5)
WBC: 3.5 10*3/uL — ABNORMAL LOW (ref 4.0–10.5)

## 2023-07-05 LAB — COMPREHENSIVE METABOLIC PANEL
ALT: 23 U/L (ref 0–35)
AST: 20 U/L (ref 0–37)
Albumin: 4.6 g/dL (ref 3.5–5.2)
Alkaline Phosphatase: 100 U/L (ref 39–117)
BUN: 12 mg/dL (ref 6–23)
CO2: 29 meq/L (ref 19–32)
Calcium: 9.5 mg/dL (ref 8.4–10.5)
Chloride: 101 meq/L (ref 96–112)
Creatinine, Ser: 0.62 mg/dL (ref 0.40–1.20)
GFR: 98.31 mL/min (ref >60.00)
Glucose, Bld: 88 mg/dL (ref 70–99)
Potassium: 4.3 meq/L (ref 3.5–5.1)
Sodium: 138 meq/L (ref 135–145)
Total Bilirubin: 1.1 mg/dL (ref 0.2–1.2)
Total Protein: 7.1 g/dL (ref 6.0–8.3)

## 2023-07-05 LAB — HEMOGLOBIN A1C: Hgb A1c MFr Bld: 5.4 % (ref 4.6–6.5)

## 2023-07-05 LAB — LIPID PANEL
Cholesterol: 222 mg/dL — ABNORMAL HIGH (ref 0–200)
HDL: 49.3 mg/dL (ref >39.00)
LDL Cholesterol: 138 mg/dL — ABNORMAL HIGH (ref 0–99)
NonHDL: 172.77
Total CHOL/HDL Ratio: 5
Triglycerides: 176 mg/dL — ABNORMAL HIGH (ref 0.0–149.0)
VLDL: 35.2 mg/dL (ref 0.0–40.0)

## 2023-07-05 LAB — TSH: TSH: 1.4 u[IU]/mL (ref 0.35–5.50)

## 2023-07-07 ENCOUNTER — Other Ambulatory Visit: Payer: 59

## 2023-07-08 LAB — LIPOPROTEIN A (LPA): Lipoprotein (a): 31 nmol/L (ref <75)

## 2023-07-09 ENCOUNTER — Ambulatory Visit
Admission: RE | Admit: 2023-07-09 | Discharge: 2023-07-09 | Disposition: A | Discharge status: Home / Self Care | Source: Ambulatory Visit | Attending: Obstetrics and Gynecology | Admitting: Obstetrics and Gynecology

## 2023-07-09 DIAGNOSIS — Z1231 Encounter for screening mammogram for malignant neoplasm of breast: Secondary | ICD-10-CM

## 2023-07-11 NOTE — Assessment & Plan Note (Signed)
 Patient encouraged to maintain heart healthy diet, regular exercise, adequate sleep. Consider daily probiotics. Take medications as prescribed Labs ordered and reviewed Given and reviewed copy of ACP documents from [facility] Secretary of State and encouraged to complete and return  MGM 06/2023 repeat next year Pap 05/2021 repeat every 3-5 years Colonoscopy 2022 repeat in 10 years

## 2023-07-11 NOTE — Assessment & Plan Note (Signed)
 hgba1c acceptable, minimize simple carbs. Increase exercise as tolerated.

## 2023-07-11 NOTE — Assessment & Plan Note (Signed)
 Encourage heart healthy diet such as MIND or DASH diet, increase exercise, avoid trans fats, simple carbohydrates and processed foods, consider a krill or fish or flaxseed oil cap daily.

## 2023-07-11 NOTE — Assessment & Plan Note (Signed)
 Hydrate and monitor

## 2023-07-11 NOTE — Assessment & Plan Note (Signed)
 She has been under a great deal of stress at work but she is managing without medications at this time.

## 2023-07-12 ENCOUNTER — Ambulatory Visit (INDEPENDENT_AMBULATORY_CARE_PROVIDER_SITE_OTHER): Payer: 59 | Admitting: Family Medicine

## 2023-07-12 VITALS — BP 130/82 | HR 88 | Temp 98.0°F | Resp 18 | Ht 59.0 in | Wt 113.0 lb

## 2023-07-12 DIAGNOSIS — N186 End stage renal disease: Secondary | ICD-10-CM | POA: Diagnosis not present

## 2023-07-12 DIAGNOSIS — F419 Anxiety disorder, unspecified: Secondary | ICD-10-CM

## 2023-07-12 DIAGNOSIS — Z0000 Encounter for general adult medical examination without abnormal findings: Secondary | ICD-10-CM

## 2023-07-12 DIAGNOSIS — R739 Hyperglycemia, unspecified: Secondary | ICD-10-CM | POA: Diagnosis not present

## 2023-07-12 DIAGNOSIS — J4520 Mild intermittent asthma, uncomplicated: Secondary | ICD-10-CM

## 2023-07-12 DIAGNOSIS — E785 Hyperlipidemia, unspecified: Secondary | ICD-10-CM

## 2023-07-12 DIAGNOSIS — F32A Depression, unspecified: Secondary | ICD-10-CM

## 2023-07-12 NOTE — Progress Notes (Signed)
 Subjective:    Patient ID: [name], female    DOB: Aug 30, 1965, 58 y.o.   MRN: [id number]  Chief Complaint  Patient presents with  . Annual Exam    HPI Discussed the use of AI scribe software for clinical note transcription with the patient, who gave verbal consent to proceed.  History of Present Illness [name] is a 57 year old female who presents with a persistent dry cough and skin changes on her finger.  She has experienced a persistent dry cough for six months following a COVID-19 infection in September. The cough has lessened but remains, particularly with physical activity. No new fevers or chills are present, though her allergies have been more active recently. She experiences heartburn, which she manages by avoiding food after 5 PM and using Mylanta, which provides relief.  She describes a skin issue on her finger that began as a 'big bubble' and became hard. After peeling it off, it bled and was red but healed over time. She applied magnesium drops and Neosporin, which helped, but the bubble has reappeared, though smaller. A rheumatologist attributed this to arthritis, noting it does not hurt but was previously fluid-filled.  She experiences sharp pain on her left side, which she associates with her known interstitial cystitis. The pain is occasional and not constant.  She reports occasional blood in her stool, which she links to straining or stress. She uses Miralax and Benefiber daily to manage her bowel movements. No significant changes in bowel habits are noted.  She experiences fatigue and weakness in her hands, making it difficult to open things, which she attributes to worsening arthritis with age.  She discusses sleep issues, mentioning a previous borderline sleep study and considering a future re-evaluation.    Past Medical History:  Diagnosis Date  . Allergic rhinitis   . Anemia   . Anxiety   . Asthma   . Contact dermatitis and eczema   .  Diverticulitis    CT Scan  . Esophageal reflux   . H. pylori infection   . Hyperlipidemia, mixed   . IBS (irritable bowel syndrome)   . IC (interstitial cystitis)   . Internal hemorrhoids   . Lumbago   . Mitral valve disorders(424.0)   . Osteoarthritis 07/08/2015  . Post concussive syndrome   . Seasonal and perennial allergic rhinitis 01/20/2015    Past Surgical History:  Procedure Laterality Date  . BARTHOLIN CYST MARSUPIALIZATION N/A 12/18/2013   Procedure: BARTHOLIN CYST MARSUPIALIZATION WITH BIOPSY;  Surgeon: Lenoard Aden, MD;  Location: [facility];  Service: Gynecology;  Laterality: N/A;  . CESAREAN SECTION    . DILITATION & CURRETTAGE/HYSTROSCOPY WITH NOVASURE ABLATION N/A 10/14/2012   Procedure: DILATATION & CURETTAGE/HYSTEROSCOPY WITH NOVASURE ABLATION;  Surgeon: Lenoard Aden, MD;  Location: [facility];  Service: Gynecology;  Laterality: N/A;  . MOUTH SURGERY    . SINUS SURGERY WITH INSTATRAK      Family History  Problem Relation Age of Onset  . Diabetes Mother   . Hypertension Mother   . Heart attack Mother        MI at age 55  . Alcohol abuse Mother        smoker  . Heart disease Mother   . Dementia Mother   . Hypertension Father   . Hyperlipidemia Father   . Heart attack Father   . Lung cancer Father        smoking hx; dx > 50  . Colon polyps Sister   .  Mental illness Sister        anxiety from 9/11 survivors  . Varicose Veins Sister   . Osteoporosis Sister   . Other Sister        hypoglycemia  . Varicose Veins Sister   . Rheum arthritis Sister        lupus  . Pleurisy Sister   . Fibromyalgia Sister   . Mental retardation Sister        depression  . Leukemia Brother        acute; dx 73s  . GER disease Son   . Colon cancer Paternal Uncle        dx unknown age  . Dementia Maternal Grandmother   . Heart disease Maternal Grandfather        mi  . Cancer Paternal Grandmother        breast/ovairan before age 15  . Alcohol abuse Other        Family  history  . Breast cancer Neg Hx     Social History   Socioeconomic History  . Marital status: Married    Spouse name: Not on file  . Number of children: 2  . Years of education: Masters  . Highest education level: Master's degree (e.g., MA, MS, MEng, MEd, MSW, MBA)  Occupational History  . Occupation: [employer]  Tobacco Use  . Smoking status: Never    Passive exposure: Past  . Smokeless tobacco: Never  Vaping Use  . Vaping status: Never Used  Substance and Sexual Activity  . Alcohol use: Not Currently  . Drug use: No  . Sexual activity: Yes    Comment: lives with husband and son, work with [employer] as a Psychologist, educational, aoivd gluten, dairy  Other Topics Concern  . Not on file  Social History Narrative   Lives at home home with husband and son.   Right-handed.   1 cup caffeine daily.   Social Drivers of Health   Financial Resource Strain: Low Risk  (10/13/2022)   Overall Financial Resource Strain (CARDIA)   . Difficulty of Paying Living Expenses: Not very hard  Food Insecurity: Patient Declined (10/13/2022)   Hunger Vital Sign   . Worried About Programme researcher, broadcasting/film/video in the Last Year: Patient declined   . Ran Out of Food in the Last Year: Patient declined  Transportation Needs: Unknown (10/13/2022)   PRAPARE - Transportation   . Lack of Transportation (Medical): No   . Lack of Transportation (Non-Medical): Not on file  Physical Activity: Sufficiently Active (10/13/2022)   Exercise Vital Sign   . Days of Exercise per Week: 5 days   . Minutes of Exercise per Session: 60 min  Stress: No Stress Concern Present (10/13/2022)   [employer] of Occupational Health - Occupational Stress Questionnaire   . Feeling of Stress : Only a little  Social Connections: Unknown (10/13/2022)   Social Connection and Isolation Panel [NHANES]   . Frequency of Communication with Friends and Family: Three times a week   . Frequency of Social Gatherings with Friends and Family:  Patient declined   . Attends Religious Services: Patient declined   . Active Member of Clubs or Organizations: No   . Attends Banker Meetings: Not on file   . Marital Status: Patient declined  Intimate Partner Violence: Not on file    Outpatient Medications Prior to Visit  Medication Sig Dispense Refill  . albuterol (VENTOLIN HFA) 108 (90 Base) MCG/ACT inhaler Inhale 2 puffs into  the lungs every 6 (six) hours as needed. 18 g 0  . azelastine (ASTELIN) 0.1 % nasal spray Place 2 sprays into both nostrils 2 (two) times daily. Use in each nostril as directed 30 mL 12  . benzonatate (TESSALON) 100 MG capsule Take 1 capsule (100 mg total) by mouth 3 (three) times daily as needed for cough. 30 capsule 0  . fluticasone (FLONASE) 50 MCG/ACT nasal spray Place 2 sprays into both nostrils daily. 16 g 1  . Multiple Vitamins-Minerals (MULTIVITAMIN ADULT PO) Take 1 tablet by mouth daily.    Marland Kitchen OVER THE COUNTER MEDICATION Calcium    . OVER THE COUNTER MEDICATION Omega 3 2100    . Probiotic Product (PROBIOTIC DAILY PO) Take 1 tablet by mouth daily.    . sodium chloride (OCEAN) 0.65 % SOLN nasal spray Place 1 spray into both nostrils as needed for congestion.     Facility-Administered Medications Prior to Visit  Medication Dose Route Frequency Provider Last Rate Last Admin  . methylPREDNISolone acetate (DEPO-MEDROL) injection 80 mg  80 mg Intramuscular Once Waymon Budge, MD        Allergies  Allergen Reactions  . Aspirin Shortness Of Breath    "irritated stomach" Ibuprofen and Naproxen do not agree with her either  . Codeine Hives, Shortness Of Breath and Other (See Comments)    Does not take percocet or hydrocodone; tylenol only  . Doxycycline Other (See Comments)    Headache after one dose  . Latex Shortness Of Breath, Itching and Other (See Comments)  . Other Shortness Of Breath and Other (See Comments)    peanuts  . Sulfa Antibiotics Hives and Shortness Of Breath  .  Sulfamethoxazole-Trimethoprim Shortness Of Breath  . Sulfonamide Derivatives Shortness Of Breath and Itching  . Sulphadimidine Sodium [Sulfamethazine Sodium] Shortness Of Breath    Added to food for preservative  . Ciprofloxacin Other (See Comments)    REACTION: SOB, "Tightness in head" REACTION: SOB, "Tightness in head" Syncope   . Carisoprodol   . Clarithromycin     "Doesn't agree with me"  . Epinephrine     Heart racing  . Fenofibrate Other (See Comments)    Leg cramps, body aches   . Metronidazole Other (See Comments)    "cannot tolerate"  . Nsaids   . Peanut (Diagnostic) Other (See Comments)    other  . Penicillins     Doesn't remember reaction  . Tolmetin   . Omeprazole Palpitations    Review of Systems  Constitutional:  Positive for malaise/fatigue. Negative for fever.  HENT:  Negative for congestion.   Eyes:  Negative for blurred vision.  Respiratory:  Positive for cough. Negative for shortness of breath.   Cardiovascular:  Negative for chest pain, palpitations and leg swelling.  Gastrointestinal:  Positive for constipation and heartburn. Negative for abdominal pain, blood in stool and nausea.  Genitourinary:  Negative for dysuria and frequency.  Musculoskeletal:  Positive for joint pain. Negative for falls.  Skin:  Negative for rash.  Neurological:  Negative for dizziness, loss of consciousness and headaches.  Endo/Heme/Allergies:  Negative for environmental allergies.  Psychiatric/Behavioral:  Negative for depression. The patient is nervous/anxious and has insomnia.        Objective:    Physical Exam Constitutional:      General: She is not in acute distress.    Appearance: Normal appearance. She is not diaphoretic.  HENT:     Head: Normocephalic and atraumatic.     Right Ear:  Tympanic membrane, ear canal and external ear normal.     Left Ear: Tympanic membrane, ear canal and external ear normal.     Nose: Nose normal.     Mouth/Throat:     Mouth:  Mucous membranes are moist.     Pharynx: Oropharynx is clear. No oropharyngeal exudate.  Eyes:     General: No scleral icterus.       Right eye: No discharge.        Left eye: No discharge.     Conjunctiva/sclera: Conjunctivae normal.     Pupils: Pupils are equal, round, and reactive to light.  Neck:     Thyroid: No thyromegaly.  Cardiovascular:     Rate and Rhythm: Normal rate and regular rhythm.     Heart sounds: Normal heart sounds. No murmur heard. Pulmonary:     Effort: Pulmonary effort is normal. No respiratory distress.     Breath sounds: Normal breath sounds. No wheezing or rales.  Abdominal:     General: Bowel sounds are normal. There is no distension.     Palpations: Abdomen is soft. There is no mass.     Tenderness: There is no abdominal tenderness.  Musculoskeletal:        General: No tenderness. Normal range of motion.     Cervical back: Normal range of motion and neck supple.  Lymphadenopathy:     Cervical: No cervical adenopathy.  Skin:    General: Skin is warm and dry.     Findings: No rash.  Neurological:     General: No focal deficit present.     Mental Status: She is alert and oriented to person, place, and time.     Cranial Nerves: No cranial nerve deficit.     Coordination: Coordination normal.     Deep Tendon Reflexes: Reflexes are normal and symmetric. Reflexes normal.  Psychiatric:        Mood and Affect: Mood normal.        Behavior: Behavior normal.        Thought Content: Thought content normal.        Judgment: Judgment normal.    BP 130/82 (BP Location: Left Leg, Patient Position: Sitting, Cuff Size: Normal)   Pulse 88   Temp 98 F (36.7 C) (Oral)   Resp 18   Ht 4\' 11"  (1.499 m)   Wt 113 lb (51.3 kg)   LMP 06/30/2016   SpO2 97%   BMI 22.82 kg/m  Wt Readings from Last 3 Encounters:  07/12/23 113 lb (51.3 kg)  06/10/23 110 lb (49.9 kg)  04/30/23 113 lb (51.3 kg)    Diabetic Foot Exam - Simple   No data filed    Lab Results   Component Value Date   WBC 3.5 (L) 07/05/2023   HGB 14.0 07/05/2023   HCT 41.4 07/05/2023   PLT 290.0 07/05/2023   GLUCOSE 88 07/05/2023   CHOL 222 (H) 07/05/2023   TRIG 176.0 (H) 07/05/2023   HDL 49.30 07/05/2023   LDLDIRECT 130.0 01/10/2021   LDLCALC 138 (H) 07/05/2023   ALT 23 07/05/2023   AST 20 07/05/2023   NA 138 07/05/2023   K 4.3 07/05/2023   CL 101 07/05/2023   CREATININE 0.62 07/05/2023   BUN 12 07/05/2023   CO2 29 07/05/2023   TSH 1.40 07/05/2023   HGBA1C 5.4 07/05/2023    Lab Results  Component Value Date   TSH 1.40 07/05/2023   Lab Results  Component Value Date   WBC  3.5 (L) 07/05/2023   HGB 14.0 07/05/2023   HCT 41.4 07/05/2023   MCV 99.4 07/05/2023   PLT 290.0 07/05/2023   Lab Results  Component Value Date   NA 138 07/05/2023   K 4.3 07/05/2023   CO2 29 07/05/2023   GLUCOSE 88 07/05/2023   BUN 12 07/05/2023   CREATININE 0.62 07/05/2023   BILITOT 1.1 07/05/2023   ALKPHOS 100 07/05/2023   AST 20 07/05/2023   ALT 23 07/05/2023   PROT 7.1 07/05/2023   ALBUMIN 4.6 07/05/2023   CALCIUM 9.5 07/05/2023   ANIONGAP 10 01/09/2019   GFR 98.31 07/05/2023   Lab Results  Component Value Date   CHOL 222 (H) 07/05/2023   Lab Results  Component Value Date   HDL 49.30 07/05/2023   Lab Results  Component Value Date   LDLCALC 138 (H) 07/05/2023   Lab Results  Component Value Date   TRIG 176.0 (H) 07/05/2023   Lab Results  Component Value Date   CHOLHDL 5 07/05/2023   Lab Results  Component Value Date   HGBA1C 5.4 07/05/2023       Assessment & Plan:  Anxiety and depression Assessment & Plan: She has been under a great deal of stress at work but she is managing without medications at this time.   Hyperglycemia Assessment & Plan: hgba1c acceptable, minimize simple carbs. Increase exercise as tolerated.    Hyperlipidemia, mild Assessment & Plan: Encourage heart healthy diet such as MIND or DASH diet, increase exercise, avoid trans  fats, simple carbohydrates and processed foods, consider a krill or fish or flaxseed oil cap daily.    Preventative health care Assessment & Plan: Patient encouraged to maintain heart healthy diet, regular exercise, adequate sleep. Consider daily probiotics. Take medications as prescribed Labs ordered and reviewed Given and reviewed copy of ACP documents from [facility] Secretary of State and encouraged to complete and return  MGM 06/2023 repeat next year Pap 05/2021 repeat every 3-5 years Colonoscopy 2022 repeat in 10 years   renal insufficiency Assessment & Plan: Hydrate and monitor    Allergic asthma, mild intermittent, uncomplicated Assessment & Plan: Patient follows with [facility], Dr Maple Hudson and is going to take Prevnar 20 and RSV, Respiratory Virus Syncitial Virus , Arexvy     Assessment and Plan Assessment & Plan Gastroesophageal Reflux Disease (GERD) Heartburn and persistent dry cough likely due to acid reflux, exacerbated post-COVID-19. Mylanta effective for heartburn, indicating reflux as a cough factor. - Use Mylanta nightly for two weeks. - Consider chest x-ray and ENT/gastroenterology consult if symptoms persist.  Constipation Occasional hematochezia likely from straining. Sharp left-sided pain not consistent with diverticular flare or hernia. Discussed hydration, fiber, and probiotics. - Continue Miralax and Benefiber daily. - Ensure adequate hydration and fiber intake. - Consider probiotics. - Monitor symptoms; consider gastroenterology referral if worsens.  Arthritis Nodules and cysts on fingers due to age-related arthritis. Prefers non-surgical management. - Continue Neosporin for finger bubble. - Consider icing affected area.  General Health Maintenance Awaiting mammogram results. Discussed healthy lifestyle and menopause impact. - Ensure receipt of iFOBT kit. - Consider pneumonia vaccination. - Follow up on mammogram results.  Follow-up Routine  follow-up to monitor health conditions and management response. - Schedule follow-up in six months.     Danise Edge, MD

## 2023-07-12 NOTE — Patient Instructions (Addendum)
 Normal blood pressure 100-140/60-90  Prevnar 20 and RSV, Respiratory Virus Syncitial Virus , Arexvy at pharmacy for Asthma  Preventive Care 59-59 Years Old, Female Preventive care refers to lifestyle choices and visits with your health care provider that can promote health and wellness. Preventive care visits are also called wellness exams. What can I expect for my preventive care visit? Counseling Your health care provider may ask you questions about your: Medical history, including: Past medical problems. Family medical history. Pregnancy history. Current health, including: Menstrual cycle. Method of birth control. Emotional well-being. Home life and relationship well-being. Sexual activity and sexual health. Lifestyle, including: Alcohol, nicotine or tobacco, and drug use. Access to firearms. Diet, exercise, and sleep habits. Work and work Astronomer. Sunscreen use. Safety issues such as seatbelt and bike helmet use. Physical exam Your health care provider will check your: Height and weight. These may be used to calculate your BMI (body mass index). BMI is a measurement that tells if you are at a healthy weight. Waist circumference. This measures the distance around your waistline. This measurement also tells if you are at a healthy weight and may help predict your risk of certain diseases, such as type 2 diabetes and high blood pressure. Heart rate and blood pressure. Body temperature. Skin for abnormal spots. What immunizations do I need?  Vaccines are usually given at various ages, according to a schedule. Your health care provider will recommend vaccines for you based on your age, medical history, and lifestyle or other factors, such as travel or where you work. What tests do I need? Screening Your health care provider may recommend screening tests for certain conditions. This may include: Lipid and cholesterol levels. Diabetes screening. This is done by checking your  blood sugar (glucose) after you have not eaten for a while (fasting). Pelvic exam and Pap test. Hepatitis B test. Hepatitis C test. HIV (human immunodeficiency virus) test. STI (sexually transmitted infection) testing, if you are at risk. Lung cancer screening. Colorectal cancer screening. Mammogram. Talk with your health care provider about when you should start having regular mammograms. This may depend on whether you have a family history of breast cancer. BRCA-related cancer screening. This may be done if you have a family history of breast, ovarian, tubal, or peritoneal cancers. Bone density scan. This is done to screen for osteoporosis. Talk with your health care provider about your test results, treatment options, and if necessary, the need for more tests. Follow these instructions at home: Eating and drinking  Eat a diet that includes fresh fruits and vegetables, whole grains, lean protein, and low-fat dairy products. Take vitamin and mineral supplements as recommended by your health care provider. Do not drink alcohol if: Your health care provider tells you not to drink. You are pregnant, may be pregnant, or are planning to become pregnant. If you drink alcohol: Limit how much you have to 0-1 drink a day. Know how much alcohol is in your drink. In the [place] one drink equals one 12 oz bottle of beer (355 mL), one 5 oz glass of wine (148 mL), or one 1 oz glass of hard liquor (44 mL). Lifestyle Brush your teeth every morning and night with fluoride toothpaste. Floss one time each day. Exercise for at least 30 minutes 5 or more days each week. Do not use any products that contain nicotine or tobacco. These products include cigarettes, chewing tobacco, and vaping devices, such as e-cigarettes. If you need help quitting, ask your health care provider. Do  not use drugs. If you are sexually active, practice safe sex. Use a condom or other form of protection to prevent STIs. If you do  not wish to become pregnant, use a form of birth control. If you plan to become pregnant, see your health care provider for a prepregnancy visit. Take aspirin only as told by your health care provider. Make sure that you understand how much to take and what form to take. Work with your health care provider to find out whether it is safe and beneficial for you to take aspirin daily. Find healthy ways to manage stress, such as: Meditation, yoga, or listening to music. Journaling. Talking to a trusted person. Spending time with friends and family. Minimize exposure to UV radiation to reduce your risk of skin cancer. Safety Always wear your seat belt while driving or riding in a vehicle. Do not drive: If you have been drinking alcohol. Do not ride with someone who has been drinking. When you are tired or distracted. While texting. If you have been using any mind-altering substances or drugs. Wear a helmet and other protective equipment during sports activities. If you have firearms in your house, make sure you follow all gun safety procedures. Seek help if you have been physically or sexually abused. What's next? Visit your health care provider once a year for an annual wellness visit. Ask your health care provider how often you should have your eyes and teeth checked. Stay up to date on all vaccines. This information is not intended to replace advice given to you by your health care provider. Make sure you discuss any questions you have with your health care provider. Document Revised: 10/09/2020 Document Reviewed: 10/09/2020 Elsevier Patient Education  2024 [employer].

## 2023-07-12 NOTE — Assessment & Plan Note (Signed)
 Patient follows with [facility], Dr Maple Hudson and is going to take Prevnar 20 and RSV, Respiratory Virus Syncitial Virus , Arexvy

## 2023-07-13 ENCOUNTER — Telehealth: Payer: Self-pay

## 2023-07-13 NOTE — Telephone Encounter (Signed)
 Copied from CRM [id number]. Topic: General - Other >> Jul 13, 2023 10:11 AM Melissa C wrote: Reason for CRM: patient was there yesterday (3/17) and mentioned measles booster but no one ever brought it up after. Please advise with patient. Thank you.

## 2023-07-14 NOTE — Telephone Encounter (Signed)
 Called patient and message given per provider's note. Patient said since she was born in 1967 would you recommend she gets the MMR booster? She also said she will call her pharmacy to see if they are giving them.

## 2023-07-15 ENCOUNTER — Other Ambulatory Visit: Payer: Self-pay | Admitting: Family Medicine

## 2023-07-15 DIAGNOSIS — R0789 Other chest pain: Secondary | ICD-10-CM

## 2023-07-15 NOTE — Telephone Encounter (Signed)
 Called and let patient know Echo has been ordered

## 2023-07-15 NOTE — Telephone Encounter (Signed)
 Called and spoke with patient. Recommendation was given per provider's note. She would like to know if an Echocardiogram could be ordered for her. Since she had Covid she now is experiencing left sided breast pain. She said she had one done 2 years ago

## 2023-07-23 ENCOUNTER — Telehealth: Payer: Self-pay

## 2023-07-23 NOTE — Telephone Encounter (Signed)
 Called and spoke with patient. She has scheduled a nurse visit for this upcoming Monday 07/26/23 at 11:30 am

## 2023-07-23 NOTE — Telephone Encounter (Signed)
 Copied from CRM [id number]. Topic: Clinical - Request for Lab/Test Order >> Jul 22, 2023  5:37 PM Taleah C wrote: Reason for CRM: pt called to ask if she needs to schedule a visit to receive the measles vaccine or can Dr. Abner Greenspan send an order for it to her pharmacy. Please call and advise.

## 2023-07-26 ENCOUNTER — Ambulatory Visit

## 2023-08-08 NOTE — Progress Notes (Signed)
 Patient ID: [name], female    DOB: 01-21-1966, 58 y.o.   MRN: [id number]  HPI female never smoker followed for allergic rhinitis, asthma. Complicated by History anxiety, GERD, IBS Office Spirometry 08/01/2015-within normal. FVC 3.02/112%, FEV1 2.36/104%, FEV1/FVC 0.78, FEF 25-75 percent 2.26/84%. PFT 11/26/2010 within normal limits without significant response to bronchodilator. FEV1/FVC 0.79 HST 05/14/22- AHI 5.8/ hr, desaturation to 91%, body weight 108  --------------------------------------------------------------------------------  08/07/22- 58 year old female never smoker followed for Allergic Rhinitis, Asthma.  Complicated by GERD, history Anxiety, IBS, Osteopenia,  HST 05/14/22- AHI 5.8/ hr, desaturation to 91%, body weight 108 lbs -Neb albuterol , albuterol  hfa, benzonatate ,  Medrol  dospak ( light) ordered by Sueanne Emerald, NP 07/27/22      Pt asked for light dosing due to osteopenia. -----Still coughing and gasping for air. Coughing is worse at night  Acute illness started about 3 weeks ago with sick exposure.  Some chilling on first day and since then cough and malaise.  Sputum initially a little yellow but has cleared.  Now annoying cough, mostly dry, worse supine interfering with sleep.  We discussed treatment options.  She had had a light dose of methylprednisolone , trying to protect bones.  She was interested in getting a Depo-Medrol  shot and I discussed this is additional steroid.  There does not seem to be an indication for antibiotic but we will check chest x-ray.  She can continue OTC cough management as reviewed and contact us  if she does not do well. -We reviewed her sleep study showing negligible OSA barely above the upper limit of normal and not of clinical consequence.  What she had described was occasionally waking with a cough and then noticing that she gasped, without daytime sleepiness. CXR 04/07/22- IMPRESSION: No active cardiopulmonary disease.  08/09/23-58 year old  female never smoker followed for Allergic Rhinitis, Asthma.  Complicated by GERD, history Anxiety, IBS, Osteopenia,  HST 05/14/22- AHI 5.8/ hr, desaturation to 91%, body weight 108 lbs -Neb albuterol , albuterol  hfa, benzonatate ,  Agreed no treatment, but consider retest if she wants. Body weight today- ------COVID in September 2024.  Still feels some SOB. Discussed the use of AI scribe software for clinical note transcription with the patient, who gave verbal consent to proceed. History of Present Illness   The patient, with a history of asthma and bronchitis, presents with a pressure feeling in the chest. She is unsure if this is related to her asthma or a result of having had COVID-19. The patient has been exercising more aggressively recently, which she believes could be causing the muscle pain. She also mentions a pain in the chest area that comes and goes. The patient's asthma and bronchitis were exacerbated by a COVID-19 infection. She is currently using albuterol  for her asthma, but it causes tremors. The patient is also considering getting a pneumonia vaccine. We reviewed [facility] info.     Assessment and Plan    Asthma Asthma exacerbated by COVID-19 in September, now stable with occasional cough likely from allergies and post-nasal drip. Albuterol  causes tremors; Airsupra  discussed as an alternative rescue inhaler. - Prescribe Airsupra  as a rescue inhaler. - Refill albuterol  inhaler. - Use Airsupra  or albuterol  before exercise as needed.  Intermittent chest pressure Intermittent chest pressure likely due to muscle strain from physical activity. No airway issues suspected. Previous chest x-ray normal. No current COVID-related lung issues. - Consider massage therapy for muscle strain relief. - Monitor symptoms and consider chest x-ray if symptoms persist or worsen.  Pneumonia vaccination Recommended Prevnar 20  due to age and asthma. Discussed potential side effects and protection  against Streptococcus pneumoniae. - Administer Prevnar 20 vaccination.     CXR 04/30/23 IMPRESSION: No active cardiopulmonary disease.  Review of Systems-see HPI     + = positive Constitutional:   No-   weight loss, night sweats, fevers, chills, fatigue, lassitude. HEENT:    +headaches, difficulty swallowing, tooth/dental problems, sore throat,       No-  sneezing, itching, ear ache, +nasal congestion, +post nasal drip,  CV:  L flank chest pain, orthopnea, PND, swelling in lower extremities, anasarca, dizziness, palpitations Resp: shortness of breath with exertion or at rest.                +non-productive cough,  No- coughing up of blood.                 change in color of mucus.  No recent  wheezing.   Skin: No-   rash or lesions. GI:  No-   heartburn, indigestion, abdominal pain, nausea, vomiting, GU:  MS: + joint pain or swelling.   Neuro-     nothing unusual Psych:  No- change in mood or affect. No depression + anxiety.  No memory loss.  Objective:   Physical Exam General- Alert, Oriented, Affect-anxious/ talkative, Distress- none acute, trim, petite woman    Skin- rash-none, lesions- none, excoriation- none Lymphadenopathy- none Head- atraumatic            Eyes- Gross vision intact, PERRLA, conjunctivae clear secretions            Ears- Hearing, canals normal. +Scarring or sclerosis TMs            Nose- +Narrow with turbinate edema, clear mucus bridging, No- Septal dev, polyps, erosion, perforation             Throat- Mallampati III , mucosa clear , drainage- none, tonsils- atrophic Neck- flexible , trachea midline, no stridor , thyroid  nl, carotid no bruit Chest - symmetrical excursion , unlabored           Heart/CV- RRR , no murmur , no gallop  , no rub, nl s1 s2                           - JVD- none , edema- none, stasis changes- none, varices- none           Lung- clear to P&A, wheeze- none, cough-none, dullness-none, rub- none           Chest wall-  Abd-  Br/ Gen/  Rectal- Not done, not indicated Extrem- cyanosis- none, clubbing, none, atrophy- none, strength- nl Neuro- grossly intact to observation

## 2023-08-09 ENCOUNTER — Ambulatory Visit (HOSPITAL_BASED_OUTPATIENT_CLINIC_OR_DEPARTMENT_OTHER)

## 2023-08-09 ENCOUNTER — Ambulatory Visit: Payer: 59 | Admitting: Internal Medicine

## 2023-08-09 ENCOUNTER — Encounter: Payer: Self-pay | Admitting: Internal Medicine

## 2023-08-09 VITALS — BP 110/70 | HR 87 | Temp 98.5°F | Ht 59.0 in | Wt 111.8 lb

## 2023-08-09 DIAGNOSIS — Z8616 Personal history of COVID-19: Secondary | ICD-10-CM | POA: Diagnosis not present

## 2023-08-09 DIAGNOSIS — Z23 Encounter for immunization: Secondary | ICD-10-CM

## 2023-08-09 DIAGNOSIS — J45909 Unspecified asthma, uncomplicated: Secondary | ICD-10-CM | POA: Diagnosis not present

## 2023-08-09 MED ORDER — AZELASTINE HCL 0.1 % NA SOLN: 2.0000 | Freq: Two times a day (BID) | NASAL | 12 refills | Status: AC

## 2023-08-09 MED ORDER — AIRSUPRA 90-80 MCG/ACT IN AERO: 2.0000 | INHALATION_SPRAY | Freq: Four times a day (QID) | RESPIRATORY_TRACT | 12 refills | Status: DC | PRN

## 2023-08-09 NOTE — Patient Instructions (Addendum)
 Script sent to try AirSupra as a rescue inhaler   inhale 1 or 2 puffs every 6 hours - rescue inhaler as needed  Order- Prevnar 20 pneumonia vaccine

## 2023-08-16 ENCOUNTER — Telehealth (HOSPITAL_BASED_OUTPATIENT_CLINIC_OR_DEPARTMENT_OTHER): Payer: Self-pay

## 2023-08-17 ENCOUNTER — Ambulatory Visit (HOSPITAL_BASED_OUTPATIENT_CLINIC_OR_DEPARTMENT_OTHER)

## 2023-08-25 ENCOUNTER — Encounter: Payer: Self-pay | Admitting: Internal Medicine

## 2023-08-31 ENCOUNTER — Encounter: Payer: Self-pay | Admitting: Physician Assistant

## 2023-08-31 ENCOUNTER — Ambulatory Visit: Admitting: Medical

## 2023-08-31 ENCOUNTER — Ambulatory Visit: Admitting: Physician Assistant

## 2023-08-31 VITALS — BP 118/69 | HR 96 | Ht 59.0 in | Wt 112.6 lb

## 2023-08-31 DIAGNOSIS — M25531 Pain in right wrist: Secondary | ICD-10-CM

## 2023-08-31 DIAGNOSIS — R0981 Nasal congestion: Secondary | ICD-10-CM

## 2023-08-31 DIAGNOSIS — S93402A Sprain of unspecified ligament of left ankle, initial encounter: Secondary | ICD-10-CM

## 2023-08-31 DIAGNOSIS — R350 Frequency of micturition: Secondary | ICD-10-CM

## 2023-08-31 DIAGNOSIS — R3129 Other microscopic hematuria: Secondary | ICD-10-CM | POA: Diagnosis not present

## 2023-08-31 DIAGNOSIS — S96912A Strain of unspecified muscle and tendon at ankle and foot level, left foot, initial encounter: Secondary | ICD-10-CM

## 2023-08-31 LAB — POC URINALSYSI DIPSTICK (AUTOMATED)
Bilirubin, UA: NEGATIVE
Glucose, UA: NEGATIVE
Ketones, UA: NEGATIVE
Leukocytes, UA: NEGATIVE
Nitrite, UA: NEGATIVE
Protein, UA: NEGATIVE
Spec Grav, UA: <=1.005 — AB (ref 1.010–1.025)
Urobilinogen, UA: 0.2 U/dL
pH, UA: 5 (ref 5.0–8.0)

## 2023-08-31 NOTE — Progress Notes (Signed)
 Established patient visit   Patient: [name]   DOB: 06-20-1965   58 y.o. Female  MRN: [id number] Visit Date: 08/31/2023  Today's healthcare provider: Trenton Frock, PA-C   Cc. Urinary frequency, fall, left ankle pain  Subjective     Discussed the use of AI scribe software for clinical note transcription with the patient, who gave verbal consent to proceed.  History of Present Illness   [name] is a 58 year old female who presents with left ankle pain following a fall yesterday.  She experiences ankle pain after a fall on a trampoline, where her foot hit a yoga block and then the metal of the trampoline. This resulted in swelling, bruising, and discomfort, causing her to limp.There is no numbness, but discomfort is present when walking. She is able to weight bare.  She has hand issues with arthritis flare-ups causing pain and weakness, particularly when writing. She reports some weakness and discomfort in her right wrist.  Concentration is affected, and she experiences vertigo and headaches, suspecting a sinus infection. She uses saline spray sporadically.  She reports urinary frequency denies dysuria, hematuria, abdominal pain.     Medications: Outpatient Medications Prior to Visit  Medication Sig   Albuterol -Budesonide  (AIRSUPRA ) 90-80 MCG/ACT AERO Inhale 2 puffs into the lungs 4 (four) times daily as needed.   azelastine  (ASTELIN ) 0.1 % nasal spray Place 2 sprays into both nostrils 2 (two) times daily. Use in each nostril as directed   benzonatate  (TESSALON ) 100 MG capsule Take 1 capsule (100 mg total) by mouth 3 (three) times daily as needed for cough.   fluticasone  (FLONASE ) 50 MCG/ACT nasal spray Place 2 sprays into both nostrils daily.   Multiple Vitamins-Minerals (MULTIVITAMIN ADULT PO) Take 1 tablet by mouth daily.   OVER THE COUNTER MEDICATION Calcium   OVER THE COUNTER MEDICATION Omega 3 2100   Probiotic Product (PROBIOTIC DAILY PO) Take 1 tablet  by mouth daily.   sodium chloride  (OCEAN) 0.65 % SOLN nasal spray Place 1 spray into both nostrils as needed for congestion.   Facility-Administered Medications Prior to Visit  Medication Dose Route Frequency Provider   methylPREDNISolone  acetate (DEPO-MEDROL ) injection 80 mg  80 mg Intramuscular Once [employer] D, MD    Review of Systems  Constitutional:  Negative for fatigue and fever.  HENT:  Positive for congestion.   Respiratory:  Negative for cough and shortness of breath.   Cardiovascular:  Negative for chest pain and leg swelling.  Gastrointestinal:  Negative for abdominal pain.  Genitourinary:  Positive for frequency.  Musculoskeletal:  Positive for arthralgias.  Neurological:  Negative for dizziness and headaches.       Objective    BP 118/69   Pulse 96   Ht 4\' 11"  (1.499 m)   Wt 112 lb 9.6 oz (51.1 kg)   LMP 06/30/2016   BMI 22.74 kg/m    Physical Exam Vitals reviewed.  Constitutional:      Appearance: She is not ill-appearing.  HENT:     Head: Normocephalic.     Right Ear: Tympanic membrane normal.     Left Ear: Tympanic membrane normal.     Mouth/Throat:     Pharynx: No oropharyngeal exudate or posterior oropharyngeal erythema.  Eyes:     Conjunctiva/sclera: Conjunctivae normal.  Cardiovascular:     Rate and Rhythm: Normal rate.  Pulmonary:     Effort: Pulmonary effort is normal. No respiratory distress.  Musculoskeletal:     Comments: Trace  edema L lateral malleolus, tender on exam. Minimal ecchymosis. Full ROM L foot and ankle without pain. Pt able to ambulate, slight limp  Neurological:     Mental Status: She is alert and oriented to person, place, and time.  Psychiatric:        Mood and Affect: Mood normal.        Behavior: Behavior normal.      Results for orders placed or performed in visit on 08/31/23  POCT Urinalysis Dipstick (Automated)  Result Value Ref Range   Color, UA yellow    Clarity, UA clear    Glucose, UA Negative  Negative   Bilirubin, UA neg    Ketones, UA neg    Spec Grav, UA <=1.005 (A) 1.010 - 1.025   Blood, UA moderate    pH, UA 5.0 5.0 - 8.0   Protein, UA Negative Negative   Urobilinogen, UA 0.2 0.2 or 1.0 E.U./dL   Nitrite, UA neg    Leukocytes, UA Negative Negative    Assessment & Plan    Sprain and strain of left ankle - Advise continued movement to promote healing - Recommend icing the ankle to reduce swelling - Provide a letter to suspend Pil[employer] membership for one month Given exam today, do not recommend xray, if pain persists/worsens over the next 2 weeks, to f/b with office and we can order imaging.  Right wrist pain -carpal tunnel vs arthritis, recommend bracing, heat, massage. Avoid overuse activities.  Sinus congestion Recommend antihistamines, nasal sprays-- flonase , azelastine , saline nasal rinses.  Urinary frequency Microscopic hematuria UA w/ moderate blood. Ordering micro and culture. Will tx as indicated.  -     POCT Urinalysis Dipstick (Automated)   Return if symptoms worsen or fail to improve.      Trenton Frock, PA-C  [facility] [phone] (phone) [phone] (fax)  [facility]

## 2023-09-01 ENCOUNTER — Other Ambulatory Visit: Payer: Self-pay | Admitting: Family Medicine

## 2023-09-01 ENCOUNTER — Ambulatory Visit: Payer: Self-pay

## 2023-09-01 ENCOUNTER — Encounter: Payer: Self-pay | Admitting: Family Medicine

## 2023-09-01 LAB — URINE CULTURE
MICRO NUMBER:: 16419339
Result:: NO GROWTH
SPECIMEN QUALITY:: ADEQUATE

## 2023-09-01 LAB — URINALYSIS, MICROSCOPIC ONLY: WBC, UA: NONE SEEN (ref 0–?)

## 2023-09-01 NOTE — Telephone Encounter (Signed)
 See nurse triage.This encounter was created in error - please disregard.

## 2023-09-01 NOTE — Telephone Encounter (Addendum)
 Chief Complaint: worsening ankle pain Symptoms: moderate to severe pain in left upper ankle Frequency: since yesterday afternoon Pertinent Negatives: Patient denies fever Disposition: [] ED /[] Urgent Care (no appt availability in office) / [] Appointment(In office/virtual)/ []  [place] [facility]/ [] Home Care/ [] Refused Recommended Disposition /[] [place] [facility]/ [x]  Follow-up with PCP Additional Notes: Patient seen yesterday by PA [name]. Patient states her left ankle buckled when walking yesterday and pain has worsened. She states she would like to have an Xray ordered. Patient would also like to address with the provider that her notes were transcribed inaccurately, she states she can go over them with the provider. She would also like POA forms mailed to her to fill out. Patient states she also needs a work excuse note for pilates mailed, advised her to check her mychart for it as well. Patient is also asking because Dr [name] is going to be working part time moving forward she is asking if she has to transfer care to another provider or if she can stay with Dr [name]. Patient states she wanted to call back and let [name] know her OBGYN and urologist recommended for her cystitis symptoms Nitrofurantoin -mono mcr 100mg .  Copied from CRM [id number]. Topic: Clinical - Red Word Triage >> Sep 01, 2023  8:02 AM [name] wrote: Red Word that prompted transfer to Nurse Triage: pt on the back line and experienced pain in  ankle. Pt was seen by a doctor yesterday.  symptoms are getting worse. Pt statin lower side bone server pain. MRN# [id number]     Reason for Disposition  [1] SEVERE pain (Garcia.g., excruciating, pain scale 8-10) AND [2] not improved after pain medications  Answer Assessment - Initial Assessment Questions 1. MAIN CONCERN OR SYMPTOM:  "What is your main concern right now?" "What question do you have?" "What's the main symptom you're worried about?" (Garcia.g., fever, pain, redness,  swelling)     Worsening pain.  2. ONSET: "When did the  pain  start?"     Yesterday afternoon around 3pm.  3. BETTER-SAME-WORSE: "Are you getting better, staying the same, or getting worse compared to how you felt at your last visit to the doctor (most recent medical visit)?"     Worse.  4. VISIT DATE: "When were you seen?" (Date)     08/31/23. 5. VISIT DOCTOR: "What is the name of the doctor taking care of you now?"     [name]. 6. VISIT DIAGNOSIS:  "What was the main symptom or problem that you were seen for?" "Were you given a diagnosis?"      Sprain/strain of left ankle. 7. VISIT TREATMENT: "What treatment did you receive?" (Garcia.g., staples, sutures, splint, cast)     No. 8. VISIT MEDICINES: "Did the doctor order any new medicines for you to use?" If Yes, ask: "Have you filled the prescription and started taking the medicine?"      No, patient states she does not want anything for the pain and states she is "very hypersensitive" to medication and has been treating with Children's Tylenol . 9. NEXT APPOINTMENT: "Have you scheduled a follow-up appointment with your doctor?"     No. 10. PAIN: "Is there any pain?" If Yes, ask: "How bad is it?"  (Scale 0-10; or mild, moderate, severe)    - NONE (0): no pain    - MILD (1-3): doesn't interfere with normal activities     - MODERATE (4-7): interferes with normal activities or awakens from sleep     -  SEVERE (8-10): excruciating pain, unable to do any normal activities       7-8/10, left lateral lower leg/upper ankle.  11. FEVER: "Do you have a fever?" If Yes, ask: "What is it, how was it measured  and when did it start?"       Denies. 12. OTHER SYMPTOMS: "Do you have any other symptoms?"       Denies.  Protocols used: Recent Medical Visit for Injury Follow-up Call-A-AH

## 2023-09-01 NOTE — Telephone Encounter (Signed)
 Copied from CRM [id number]. Topic: Clinical - Red Word Triage >> Sep 01, 2023  8:02 AM Concetta Dee E wrote: Red Word that prompted transfer to Nurse Triage: pt on the back line and experienced pain in  ankle. Pt was seen by a doctor yesterday.  symptoms are getting worse. Pt statin lower side bone server pain. MRN# [id number]

## 2023-09-02 ENCOUNTER — Encounter: Payer: Self-pay | Admitting: Physician Assistant

## 2023-09-21 ENCOUNTER — Other Ambulatory Visit: Payer: Self-pay | Admitting: Physician Assistant

## 2023-09-21 ENCOUNTER — Ambulatory Visit: Payer: Self-pay

## 2023-09-21 DIAGNOSIS — S93402A Sprain of unspecified ligament of left ankle, initial encounter: Secondary | ICD-10-CM

## 2023-09-21 NOTE — Telephone Encounter (Signed)
 Copied from CRM [id number]. Topic: Clinical - Red Word Triage >> Sep 21, 2023  7:55 AM Kita Perish H wrote: [facility] that prompted transfer to Nurse Triage: Anxiety issues, issues with right hand numbness stiff, sometimes cold sensation can't open things.  Chief Complaint: anxiety, depression, right hand numbness, stiff (states arthritis, carpal tunnel.)  Symptoms: see above Frequency: constant Pertinent Negatives: Patient denies si, hi, cp, sob Disposition: [] ED /[] Urgent Care (no appt availability in office) / [] Appointment(In office/virtual)/ []  [place] [facility]/ [x] Home Care/ [] Refused Recommended Disposition /[] [place] [facility]/ []  Follow-up with PCP Additional Notes: apt made in November, per her request.  Care advice given, denies questions; instructed to go to ER if becomes worse.   Reason for Disposition  MILD anxiety symptoms (e.g., Anxiety symptoms are mild and intermittent; symptoms do not interfere with daily activities)  Answer Assessment - Initial Assessment Questions 1. CONCERN: "Did anything happen that prompted you to call today?"      Anxiety issues and right hand numbness, stiff 2. ANXIETY SYMPTOMS: "Can you describe how you (your loved one; patient) have been feeling?" (e.g., tense, restless, panicky, anxious, keyed up, overwhelmed, sense of impending doom).      States she is always anxious over her job 3. ONSET: "How long have you been feeling this way?" (e.g., hours, days, weeks)     On goining 4. SEVERITY: "How would you rate the level of anxiety?" (e.g., 0 - 10; or mild, moderate, severe).     6/10 5. FUNCTIONAL IMPAIRMENT: "How have these feelings affected your ability to do daily activities?" "Have you had more difficulty than usual doing your normal daily activities?" (e.g., getting better, same, worse; self-care, school, work, interactions)     Yes, affects daily life 6. HISTORY: "Have you felt this way before?" "Have you ever been diagnosed with an  anxiety problem in the past?" (e.g., generalized anxiety disorder, panic attacks, PTSD). If Yes, ask: "How was this problem treated?" (e.g., medicines, counseling, etc.)     General anxiety 7. RISK OF HARM - SUICIDAL IDEATION: "Do you ever have thoughts of hurting or killing yourself?" If Yes, ask:  "Do you have these feelings now?" "Do you have a plan on how you would do this?"     no 8. TREATMENT:  "What has been done so far to treat this anxiety?" (e.g., medicines, relaxation strategies). "What has helped?"     no 9. TREATMENT - THERAPIST: "Do you have a counselor or therapist? Name?"     yes 10. POTENTIAL TRIGGERS: "Do you drink caffeinated beverages (e.g., coffee, colas, teas), and how much daily?" "Do you drink alcohol or use any drugs?" "Have you started any new medicines recently?"       Work, yes caffeine makes it worse. Stays away from caffeine.  11. PATIENT SUPPORT: "Who is with you now?" "Who do you live with?" "Do you have family or friends who you can talk to?"        yes 12. OTHER SYMPTOMS: "Do you have any other symptoms?" (e.g., feeling depressed, trouble concentrating, trouble sleeping, trouble breathing, palpitations or fast heartbeat, chest pain, sweating, nausea, or diarrhea)       Feeling depressed, trouble sleeping,  13. PREGNANCY: "Is there any chance you are pregnant?" "When was your last menstrual period?"       no  Protocols used: Anxiety and Panic Attack-A-AH

## 2023-09-21 NOTE — Telephone Encounter (Signed)
 Patient called back to make an earlier apt.

## 2023-09-22 NOTE — Telephone Encounter (Signed)
 Called patient and no answer left a detailed vm advising patient that the x-ray order was placed and she will receive a call to schedule.

## 2023-09-27 ENCOUNTER — Ambulatory Visit: Admitting: Medical

## 2023-09-27 ENCOUNTER — Telehealth (HOSPITAL_BASED_OUTPATIENT_CLINIC_OR_DEPARTMENT_OTHER): Payer: Self-pay

## 2023-09-28 ENCOUNTER — Telehealth: Payer: Self-pay | Admitting: *Deleted

## 2023-09-28 ENCOUNTER — Telehealth: Payer: Self-pay | Admitting: Family Medicine

## 2023-09-28 NOTE — Telephone Encounter (Signed)
 Copied from CRM [id number]. Topic: General - Call Back - No Documentation >> Sep 28, 2023  4:55 PM Leah C wrote: Reason for CRM: Patient called in and decided to keep appointment with Dr. Lari Pleva on June 6th. She no longer needs medical leave forms filled out because she left the job.   Patient is asking can she just do a walk-in for x-rays or does she need to schedule an appointment?   Also, Patient is inquiring if Dr. Rodrick Clapper can fill out the form for her service dog, and will the form be mailed out to her or does she need to pick it up.   Patients contact is [phone].

## 2023-09-28 NOTE — Telephone Encounter (Signed)
 Left message on machine to call back.  We would like to see if she would like to switch appt to [place] on Friday or sooner appt on tomorrow.

## 2023-09-29 ENCOUNTER — Ambulatory Visit: Admitting: Student

## 2023-09-29 ENCOUNTER — Ambulatory Visit (HOSPITAL_BASED_OUTPATIENT_CLINIC_OR_DEPARTMENT_OTHER)
Admission: RE | Admit: 2023-09-29 | Discharge: 2023-09-29 | Disposition: A | Discharge status: Home / Self Care | Source: Ambulatory Visit | Attending: Physician Assistant | Admitting: Physician Assistant

## 2023-09-29 VITALS — BP 126/82 | HR 82 | Temp 98.2°F | Resp 12 | Ht 59.0 in | Wt 113.4 lb

## 2023-09-29 DIAGNOSIS — J3089 Other allergic rhinitis: Secondary | ICD-10-CM | POA: Diagnosis not present

## 2023-09-29 DIAGNOSIS — S93402A Sprain of unspecified ligament of left ankle, initial encounter: Secondary | ICD-10-CM | POA: Insufficient documentation

## 2023-09-29 DIAGNOSIS — F419 Anxiety disorder, unspecified: Secondary | ICD-10-CM

## 2023-09-29 DIAGNOSIS — F32A Depression, unspecified: Secondary | ICD-10-CM

## 2023-09-29 DIAGNOSIS — J302 Other seasonal allergic rhinitis: Secondary | ICD-10-CM | POA: Diagnosis not present

## 2023-09-29 DIAGNOSIS — S96912A Strain of unspecified muscle and tendon at ankle and foot level, left foot, initial encounter: Secondary | ICD-10-CM | POA: Diagnosis present

## 2023-09-29 MED ORDER — BENZONATATE 100 MG PO CAPS: 100.0000 mg | ORAL_CAPSULE | Freq: Three times a day (TID) | ORAL | 0 refills | Status: DC | PRN

## 2023-09-29 NOTE — Telephone Encounter (Signed)
 Pt appointment moved to [place] schedule for today.

## 2023-09-29 NOTE — Assessment & Plan Note (Signed)
 Patient having seasonal allergy symptoms.  Compliant with Astelin  0.1% nasal spray twice daily, and sodium chloride  nasal spray.  Discussed Flonase  with patient for symptom relief,  patient does not want to restart this r/t  HA in past use.  If symptoms persist encourage patient to start daily antihistamine such as Zyrtec or Claritin OTC.  Has mild dry cough, Tessalon  pearls sent to pharmacy.  RTC if  experiencing persistent or worsening symptoms

## 2023-09-29 NOTE — Progress Notes (Signed)
 Subjective:     Patient ID: [name], female    DOB: 1965-11-02, 58 y.o.   MRN: [id number]  Chief Complaint  Patient presents with   Anxiety   Asthma   Abnormal ECG   refills    HPI [name] is a 58 yo female who presents for acute visit.  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  She states her stress has greatly improved and sleep since leaving prior position and she is managing her depression/anxiety well.  She does see a Veterinary surgeon.  She recently got a dog- golden doodle puppy- feels much support and reports dog has helped with her stress/anxiety greatly.   DEPRESSION/ ANXIETY -Medications: no medications - SI/HI: No thoughts of harming self or others. -Sleeping well, sleeping better since quitting job -Denies self-medication with alcohol, prescription drugs or illicit drugs. -Pt feels like she's managing well.  -She following with a counselor/psychologist.  C/O  Complaints of mild, intermittent dry cough and nasal congestion. Denies URI symptoms. Feels may be allergy related as she is walking outside with dog more.  Patient denies fever, chills, SOB, CP, palpitations, dyspnea, edema, HA, vision changes, N/V/D, abdominal pain, urinary symptoms, rash, weight changes, and recent illness or hospitalizations.       History of Present Illness              There are no preventive care reminders to display for this patient.  Past Medical History:  Diagnosis Date   Allergic rhinitis    Anemia    Anxiety    Asthma    Contact dermatitis and eczema    Diverticulitis    CT Scan   Esophageal reflux    H. pylori infection    Hyperlipidemia, mixed    IBS (irritable bowel syndrome)    IC (interstitial cystitis)    Internal hemorrhoids    Lumbago    Mitral valve disorders(424.0)    Osteoarthritis 07/08/2015   Post concussive syndrome    Seasonal and perennial allergic rhinitis  01/20/2015    Past Surgical History:  Procedure Laterality Date   BARTHOLIN CYST MARSUPIALIZATION N/A 12/18/2013   Procedure: BARTHOLIN CYST MARSUPIALIZATION WITH BIOPSY;  Surgeon: Camillo Celestine, MD;  Location: [facility];  Service: Gynecology;  Laterality: N/A;   CESAREAN SECTION     DILITATION & CURRETTAGE/HYSTROSCOPY WITH NOVASURE ABLATION N/A 10/14/2012   Procedure: DILATATION & CURETTAGE/HYSTEROSCOPY WITH NOVASURE ABLATION;  Surgeon: Camillo Celestine, MD;  Location: [facility];  Service: Gynecology;  Laterality: N/A;   MOUTH SURGERY     SINUS SURGERY WITH INSTATRAK      Family History  Problem Relation Age of Onset   Diabetes Mother    Hypertension Mother    Heart attack Mother        MI at age 28   Alcohol abuse Mother        smoker   Heart disease Mother    Dementia Mother    Hypertension Father    Hyperlipidemia Father    Heart attack Father    Lung cancer Father        smoking hx; dx > 50   Colon polyps Sister    Mental illness Sister        anxiety from 9/11 survivors   Varicose Veins Sister    Osteoporosis Sister    Other Sister        hypoglycemia   Varicose Veins Sister  Rheum arthritis Sister        lupus   Pleurisy Sister    Fibromyalgia Sister    Mental retardation Sister        depression   Leukemia Brother        acute; dx 76s   GER disease Son    Colon cancer Paternal Uncle        dx unknown age   Dementia Maternal Grandmother    Heart disease Maternal Grandfather        mi   Cancer Paternal Grandmother        breast/ovairan before age 59   Alcohol abuse Other        Family history   Breast cancer Neg Hx     Social History   Socioeconomic History   Marital status: Married    Spouse name: Not on file   Number of children: 2   Years of education: Masters   Highest education level: Master's degree (e.g., MA, MS, MEng, MEd, MSW, MBA)  Occupational History   Occupation: Clinical cytogeneticist  Tobacco Use   Smoking status: Never     Passive exposure: Past   Smokeless tobacco: Never  Vaping Use   Vaping status: Never Used  Substance and Sexual Activity   Alcohol use: Not Currently   Drug use: No   Sexual activity: Yes    Comment: lives with husband and son, work with Jeanetta Milian as a Psychologist, educational, aoivd gluten, dairy  Other Topics Concern   Not on file  Social History Narrative   Lives at home home with husband and son.   Right-handed.   1 cup caffeine daily.   Social Drivers of Corporate investment banker Strain: Low Risk  (09/29/2023)   Overall Financial Resource Strain (CARDIA)    Difficulty of Paying Living Expenses: Not very hard  Food Insecurity: No Food Insecurity (09/29/2023)   Hunger Vital Sign    Worried About Running Out of Food in the Last Year: Never true    Ran Out of Food in the Last Year: Never true  Transportation Needs: No Transportation Needs (09/29/2023)   PRAPARE - Administrator, Civil Service (Medical): No    Lack of Transportation (Non-Medical): No  Physical Activity: Sufficiently Active (09/29/2023)   Exercise Vital Sign    Days of Exercise per Week: 4 days    Minutes of Exercise per Session: 50 min  Stress: Stress Concern Present (09/29/2023)   [employer] of Occupational Health - Occupational Stress Questionnaire    Feeling of Stress : To some extent  Social Connections: Moderately Isolated (09/29/2023)   Social Connection and Isolation Panel [NHANES]    Frequency of Communication with Friends and Family: Once a week    Frequency of Social Gatherings with Friends and Family: Once a week    Attends Religious Services: 1 to 4 times per year    Active Member of [employer] or Organizations: No    Attends Engineer, structural: Not on file    Marital Status: Married  Catering manager Violence: Not on file    Outpatient Medications Prior to Visit  Medication Sig Dispense Refill   azelastine  (ASTELIN ) 0.1 % nasal spray Place 2 sprays into both nostrils 2 (two) times daily. Use in  each nostril as directed 30 mL 12   Multiple Vitamins-Minerals (MULTIVITAMIN ADULT PO) Take 1 tablet by mouth daily.     OVER THE COUNTER MEDICATION Calcium     OVER THE  COUNTER MEDICATION Omega 3 2100     Probiotic Product (PROBIOTIC DAILY PO) Take 1 tablet by mouth daily.     sodium chloride  (OCEAN) 0.65 % SOLN nasal spray Place 1 spray into both nostrils as needed for congestion.     VITAMIN D PO Take 1 tablet by mouth daily.     benzonatate  (TESSALON ) 100 MG capsule Take 1 capsule (100 mg total) by mouth 3 (three) times daily as needed for cough. 30 capsule 0   Albuterol -Budesonide  (AIRSUPRA ) 90-80 MCG/ACT AERO Inhale 2 puffs into the lungs 4 (four) times daily as needed. 10.7 g 12   fluticasone  (FLONASE ) 50 MCG/ACT nasal spray Place 2 sprays into both nostrils daily. 16 g 1   Facility-Administered Medications Prior to Visit  Medication Dose Route Frequency Provider Last Rate Last Admin   methylPREDNISolone  acetate (DEPO-MEDROL ) injection 80 mg  80 mg Intramuscular Once Rosa [employer] D, MD        Allergies  Allergen Reactions   Aspirin Shortness Of Breath    "irritated stomach" Ibuprofen and Naproxen do not agree with her either   Codeine Hives, Shortness Of Breath and Other (See Comments)    Does not take percocet or hydrocodone ; tylenol  only   Doxycycline  Other (See Comments)    Headache after one dose   Latex Shortness Of Breath, Itching and Other (See Comments)   Other Shortness Of Breath and Other (See Comments)    peanuts   Sulfa Antibiotics Hives and Shortness Of Breath   Sulfamethoxazole-Trimethoprim Shortness Of Breath   Sulfonamide Derivatives Shortness Of Breath and Itching   Sulphadimidine Sodium [Sulfamethazine Sodium] Shortness Of Breath    Added to food for preservative   Ciprofloxacin  Other (See Comments)    REACTION: SOB, "Tightness in head" REACTION: SOB, "Tightness in head" Syncope    Carisoprodol    Clarithromycin     "Doesn't agree with me"    Epinephrine     Heart racing   Fenofibrate  Other (See Comments)    Leg cramps, body aches    Metronidazole  Other (See Comments)    "cannot tolerate"   Nsaids    Peanut (Diagnostic) Other (See Comments)    other   Penicillins     Doesn't remember reaction   Tolmetin    Omeprazole  Palpitations    ROS See HPI    Objective:     Physical Exam  General: No acute distress. Awake and conversant.  Eyes: Normal conjunctiva, anicteric. Round symmetric pupils.  ENT: Hearing grossly intact. No nasal discharge.  Respiratory: CTAB. Respirations are non-labored. No wheezing.  Skin: Warm. No rashes or ulcers.  Psych: Alert and oriented. Cooperative, Appropriate mood and affect, Normal judgment.  CV: RRR. No lower extremity edema.  MSK: Normal ambulation.    BP 126/82 (BP Location: Right Arm, Patient Position: Sitting, Cuff Size: Normal)   Pulse 82   Temp 98.2 F (36.8 C) (Oral)   Resp 12   Ht 4\' 11"  (1.499 m)   Wt 113 lb 6.4 oz (51.4 kg)   LMP 06/30/2016   SpO2 98%   BMI 22.90 kg/m  Wt Readings from Last 3 Encounters:  09/29/23 113 lb 6.4 oz (51.4 kg)  08/31/23 112 lb 9.6 oz (51.1 kg)  08/09/23 111 lb 12.8 oz (50.7 kg)       Assessment & Plan:   Problem List Items Addressed This Visit     Anxiety and depression - Primary   Managing well without medications.  Still seeing counselor.  Recently  stopped working at prior stressful job and currently looking for part-time work in less stressed environment.  She recently got a dog that has been very helpful for her anxiety and stress.  Support and monitor.          Seasonal and perennial allergic rhinitis   Patient having seasonal allergy symptoms.  Compliant with Astelin  0.1% nasal spray twice daily, and sodium chloride  nasal spray.  Discussed Flonase  with patient for symptom relief,  patient does not want to restart this r/t  HA in past use.  If symptoms persist encourage patient to start daily antihistamine such as Zyrtec or  Claritin OTC.  Has mild dry cough, Tessalon  pearls sent to pharmacy.  RTC if  experiencing persistent or worsening symptoms       I have discontinued Belinda Netto's fluticasone  and Airsupra . I am also having her maintain her Probiotic Product (PROBIOTIC DAILY PO), OVER THE COUNTER MEDICATION, Multiple Vitamins-Minerals (MULTIVITAMIN ADULT PO), sodium chloride , OVER THE COUNTER MEDICATION, azelastine , VITAMIN D PO, and benzonatate . We will continue to administer methylPREDNISolone  acetate.  Meds ordered this encounter  Medications   benzonatate  (TESSALON ) 100 MG capsule    Sig: Take 1 capsule (100 mg total) by mouth 3 (three) times daily as needed for cough.    Dispense:  30 capsule    Refill:  0    Supervising Provider:   Randie Bustle A [[id number]]

## 2023-09-29 NOTE — Assessment & Plan Note (Signed)
 Managing well without medications.  Still seeing counselor.  Recently stopped working at prior stressful job and currently looking for part-time work in less stressed environment.  She recently got a dog that has been very helpful for her anxiety and stress.  Support and monitor.

## 2023-09-29 NOTE — Telephone Encounter (Signed)
 Patient called back and decided to change appt from [name] to [name]. Her appt is this afternoon at 3p.

## 2023-09-30 ENCOUNTER — Ambulatory Visit: Payer: Self-pay | Admitting: Physician Assistant

## 2023-09-30 NOTE — Progress Notes (Unsigned)
 HPI: FU CP and syncope.  ABIs May 2017 normal.  Monitor October 2017 showed sinus rhythm.  Also seen for syncopal episode in October 2017.  MRI negative.  EEG normal.  Seen by neurology and felt to have postconcussive syndrome. Exercise treadmill October 2018 was negative. Echocardiogram March 2019 showed normal LV systolic function. Calcium score January 2022 0.  Renal Dopplers September 2022 showed no renal artery stenosis.  Lower extremity venous Dopplers October 2022 showed no DVT. Since last seen, patient states that she had some squeezing sensations in her chest and arms when she was stressed at work.  She has not had exertional chest pain.  Denies syncope.  Some dyspnea with activities since COVID in September relieved with bronchodilators.  No pedal edema.  Current Outpatient Medications  Medication Sig Dispense Refill   azelastine  (ASTELIN ) 0.1 % nasal spray Place 2 sprays into both nostrils 2 (two) times daily. Use in each nostril as directed 30 mL 12   benzonatate  (TESSALON ) 100 MG capsule Take 1 capsule (100 mg total) by mouth 3 (three) times daily as needed for cough. 30 capsule 0   Multiple Vitamins-Minerals (MULTIVITAMIN ADULT PO) Take 1 tablet by mouth daily.     OVER THE COUNTER MEDICATION Calcium     OVER THE COUNTER MEDICATION Omega 3 2100     Probiotic Product (PROBIOTIC DAILY PO) Take 1 tablet by mouth daily.     sodium chloride  (OCEAN) 0.65 % SOLN nasal spray Place 1 spray into both nostrils as needed for congestion.     VITAMIN D PO Take 1 tablet by mouth daily.     Current Facility-Administered Medications  Medication Dose Route Frequency Provider Last Rate Last Admin   methylPREDNISolone  acetate (DEPO-MEDROL ) injection 80 mg  80 mg Intramuscular Once Rosa [employer] D, MD         Past Medical History:  Diagnosis Date   Allergic rhinitis    Anemia    Anxiety    Asthma    Contact dermatitis and eczema    Diverticulitis    CT Scan   Esophageal reflux    H.  pylori infection    Hyperlipidemia, mixed    IBS (irritable bowel syndrome)    IC (interstitial cystitis)    Internal hemorrhoids    Lumbago    Mitral valve disorders(424.0)    Osteoarthritis 07/08/2015   Post concussive syndrome    Seasonal and perennial allergic rhinitis 01/20/2015    Past Surgical History:  Procedure Laterality Date   BARTHOLIN CYST MARSUPIALIZATION N/A 12/18/2013   Procedure: BARTHOLIN CYST MARSUPIALIZATION WITH BIOPSY;  Surgeon: Camillo Celestine, MD;  Location: [facility];  Service: Gynecology;  Laterality: N/A;   CESAREAN SECTION     DILITATION & CURRETTAGE/HYSTROSCOPY WITH NOVASURE ABLATION N/A 10/14/2012   Procedure: DILATATION & CURETTAGE/HYSTEROSCOPY WITH NOVASURE ABLATION;  Surgeon: Camillo Celestine, MD;  Location: [facility];  Service: Gynecology;  Laterality: N/A;   MOUTH SURGERY     SINUS SURGERY WITH INSTATRAK      Social History   Socioeconomic History   Marital status: Married    Spouse name: Not on file   Number of children: 2   Years of education: Masters   Highest education level: Master's degree (e.g., MA, MS, MEng, MEd, MSW, MBA)  Occupational History   Occupation: Clinical cytogeneticist  Tobacco Use   Smoking status: Never    Passive exposure: Past   Smokeless tobacco: Never  Vaping Use   Vaping  status: Never Used  Substance and Sexual Activity   Alcohol use: Not Currently   Drug use: No   Sexual activity: Yes    Comment: lives with husband and son, work with Jeanetta Milian as a Psychologist, educational, aoivd gluten, dairy  Other Topics Concern   Not on file  Social History Narrative   Lives at home home with husband and son.   Right-handed.   1 cup caffeine daily.   Social Drivers of Corporate investment banker Strain: Low Risk  (09/29/2023)   Overall Financial Resource Strain (CARDIA)    Difficulty of Paying Living Expenses: Not very hard  Food Insecurity: No Food Insecurity (09/29/2023)   Hunger Vital Sign    Worried About Running Out of Food in  the Last Year: Never true    Ran Out of Food in the Last Year: Never true  Transportation Needs: No Transportation Needs (09/29/2023)   PRAPARE - Administrator, Civil Service (Medical): No    Lack of Transportation (Non-Medical): No  Physical Activity: Sufficiently Active (09/29/2023)   Exercise Vital Sign    Days of Exercise per Week: 4 days    Minutes of Exercise per Session: 50 min  Stress: Stress Concern Present (09/29/2023)   [employer] of Occupational Health - Occupational Stress Questionnaire    Feeling of Stress : To some extent  Social Connections: Moderately Isolated (09/29/2023)   Social Connection and Isolation Panel [NHANES]    Frequency of Communication with Friends and Family: Once a week    Frequency of Social Gatherings with Friends and Family: Once a week    Attends Religious Services: 1 to 4 times per year    Active Member of [employer] or Organizations: No    Attends Engineer, structural: Not on file    Marital Status: Married  Catering manager Violence: Not on file    Family History  Problem Relation Age of Onset   Diabetes Mother    Hypertension Mother    Heart attack Mother        MI at age 3   Alcohol abuse Mother        smoker   Heart disease Mother    Dementia Mother    Hypertension Father    Hyperlipidemia Father    Heart attack Father    Lung cancer Father        smoking hx; dx > 50   Colon polyps Sister    Mental illness Sister        anxiety from 9/11 survivors   Varicose Veins Sister    Osteoporosis Sister    Other Sister        hypoglycemia   Varicose Veins Sister    Rheum arthritis Sister        lupus   Pleurisy Sister    Fibromyalgia Sister    Mental retardation Sister        depression   Leukemia Brother        acute; dx 7s   GER disease Son    Colon cancer Paternal Uncle        dx unknown age   Dementia Maternal Grandmother    Heart disease Maternal Grandfather        mi   Cancer Paternal Grandmother         breast/ovairan before age 47   Alcohol abuse Other        Family history   Breast cancer Neg Hx     ROS:  no fevers or chills, productive cough, hemoptysis, dysphasia, odynophagia, melena, hematochezia, dysuria, hematuria, rash, seizure activity, orthopnea, PND, pedal edema, claudication. Remaining systems are negative.  Physical Exam: Well-developed well-nourished in no acute distress.  Skin is warm and dry.  HEENT is normal.  Neck is supple.  Chest is clear to auscultation with normal expansion.  Cardiovascular exam is regular rate and rhythm.  Abdominal exam nontender or distended. No masses palpated. Extremities show no edema. neuro grossly intact  EKG Interpretation Date/Time:  Friday October 01 2023 10:38:47 EDT Ventricular Rate:  74 PR Interval:  120 QRS Duration:  66 QT Interval:  382 QTC Calculation: 424 R Axis:   59  Text Interpretation: Normal sinus rhythm Normal ECG Confirmed by Alexandria Angel ([id number]) on 10/01/2023 10:40:09 AM    A/P  1 history of syncope-patient has had no recurrences since previous office visit.  Previous episode felt likely vagal etiology.  2 chest pain-patient had some chest tightness and arm tightness with stress which has now resolved.  She does not have exertional chest pain and electrocardiogram shows no ST changes.  Previous calcium score 0.  Will not pursue further cardiac evaluation at this time.  3 history of mitral valve prolapse-not evident on most recent echocardiogram.  4 hyperlipidemia-no documented vascular disease.  Will continue diet.  Alexandria Angel, MD

## 2023-10-01 ENCOUNTER — Ambulatory Visit: Attending: Cardiology | Admitting: Cardiology

## 2023-10-01 ENCOUNTER — Encounter: Payer: Self-pay | Admitting: Cardiology

## 2023-10-01 ENCOUNTER — Ambulatory Visit: Admitting: Medical

## 2023-10-01 VITALS — BP 130/70 | HR 69 | Ht 59.0 in | Wt 110.8 lb

## 2023-10-01 DIAGNOSIS — E785 Hyperlipidemia, unspecified: Secondary | ICD-10-CM

## 2023-10-01 DIAGNOSIS — R072 Precordial pain: Secondary | ICD-10-CM

## 2023-10-01 DIAGNOSIS — R55 Syncope and collapse: Secondary | ICD-10-CM

## 2023-10-01 DIAGNOSIS — I059 Rheumatic mitral valve disease, unspecified: Secondary | ICD-10-CM | POA: Diagnosis not present

## 2023-10-01 NOTE — Patient Instructions (Signed)

## 2023-10-04 ENCOUNTER — Telehealth: Payer: Self-pay | Admitting: Family Medicine

## 2023-10-04 NOTE — Telephone Encounter (Signed)
 Copied from CRM [id number]. Topic: General - Call Back - No Documentation >> Oct 04, 2023  9:47 AM Lajean Pike wrote: Reason for CRM: Patient needs a letter from her physician to apply for unemployment stating that due to her anxiety, blood pressure fluctuating, and hostile environment at work that she had to leave work. [phone]

## 2023-10-05 NOTE — Telephone Encounter (Signed)
 Letter done and just waiting on providers signature.

## 2023-10-07 ENCOUNTER — Telehealth: Payer: Self-pay

## 2023-10-07 NOTE — Telephone Encounter (Signed)
 Returned patients call and she was advised that renal disease or  depression is not listed in her chart. She would like [employer] to be called and was advised it will be tomorrow,10/07/23 before someone can call. [phone]- benefit services  [phone]  Copied from CRM [id number]. Topic: General - Other >> Oct 06, 2023  2:07 PM Adonis Hoot wrote: Reason for CRM: Patient was told by [facility] while trying to establish private insurance with them that she has renal disease and major depression  .Patient stated that she's never been diagnosed with renal disease,or depression.She just has anxiety.  They told her to ask PCP if something was coded in error.

## 2023-10-20 ENCOUNTER — Ambulatory Visit: Admitting: Internal Medicine

## 2023-11-22 ENCOUNTER — Other Ambulatory Visit: Payer: Self-pay | Admitting: Medical

## 2023-11-22 ENCOUNTER — Ambulatory Visit: Admitting: Cardiology

## 2023-12-08 NOTE — Progress Notes (Signed)
 Office Visit Note  Patient: [name]             Date of Birth: July 17, 1965           MRN: [id number]             PCP: Domenica Harlene LABOR, MD Referring: Domenica Harlene LABOR, MD Visit Date: 12/09/2023   Subjective:  Follow-up (Nodules on her fingers .)   Discussed the use of AI scribe software for clinical note transcription with the patient, who gave verbal consent to proceed.  History of Present Illness   [name] is a 58 year old female with osteoarthritis who presents with continued symptoms also with new digital mucinous cysts on her fingers.  She experiences cysts on her fingers, which she attributes to trauma. The cysts initially appear as red circles and have drained spontaneously in the past, releasing a watery fluid. She manages them with peroxide, Neosporin, Arnica, and an antibiotic ointment, likely Bactroban , and keeps them covered with a Band-Aid to prevent infection.  She has osteoarthritis affecting her fingers and thumb, causing pain, particularly in the thumb joint during activities like gripping objects or Pilates. Compression gloves provide some relief. The thumb pain is more bothersome than the cysts.  She experiences sporadic knee pain, particularly in one knee, which she associates with weather changes, and stiffness in her ankle following a sprain. The knee pain is not consistent but certain areas are sensitive.  She has a history of irritable bowel syndrome and bladder issues, with occasional lower abdominal and side pain. These symptoms are sometimes linked to increased physical activity and lifestyle changes, including quitting a stressful job and engaging more in Pilates. She also reports intermittent sleep disturbances and sporadic sinus-related headaches.  Her sister has fibromyalgia, and she acknowledges experiencing symptoms like fatigue, concentration issues, and sporadic headaches, but she does not have widespread pain or hypersensitivity.        Previous HPI 11/24/22 [name] is a 58 y.o. female here for evaluation of positive ANA checked associated with symptoms of chronic joint pain in multiple areas also with fatigue and dry eyes.  Tenderness increased joint pain and stiffness in multiple areas especially in the past approximately 2 years.  She does feel this corresponds somewhat with a decrease in her usually very high level of physical activity and aerobic exercises associated with COVID changes.  Also with fracture of her right great toe June of last year treated conservatively with some weakness and increased pain in the right knee.  More recently has been able to resume some of her regular exercises and feels with increased muscle strength in her proximal arms and legs is having a decrease in that knee pain.  Not seeing any visible joint swelling in the affected areas.  Does not have prolonged morning joint stiffness.  Area that remains problematic is in her hand she has some stiffness feels her grip is weaker than usual and has also noticed some finger nodules that appear to be getting bigger. She has been dealing with apparently perimenopausal related symptoms for around 6 years frequently getting hot flashes and sweating.  Has persistent eye and mouth dryness not associated with any abrasions or ulcers.  Hands and feet frequently get very cold does not see visible discoloration.  Does not tolerate out in the sun but no specific pattern of rashes in the photosensitive distribution.  No cervical lymphadenopathy, no abnormal bleeding or blood clots. She does have some chronic anxiety, fatigue,  constipation and interstitial cystitis. She has a family history of lupus and fibromyalgia in her sister.   Labs reviewed 06/2022 ANA 1:80 cytoplasmic RF neg CBC, CMP wnl   07/07/22 Bone Density test ASSESSMENT: The BMD measured at AP Spine L1-L4 (L3) is 0.940 g/cm2 with a T-score of -1.9. This patient is considered osteopenic  according to World Health Organization [place]) criteria. Compared with the prior study on, 08/24/2019 the BMD of the total mean shows a statistically significant decrease. L-3 was excluded due to degenerative changes. The scan quality is good.   Site Region Measured Date Measured Age WHO YA BMD Classification T-score AP Spine L1-L4 (L3) 07/07/2022 57.2 Low Bone Mass -1.9 0.940 g/cm2 AP Spine L1-L4 (L3) 08/24/2019 54.3 Low Bone Mass -1.2 1.028 g/cm2   DualFemur Neck Left 07/07/2022 57.2 Low Bone Mass -1.9 0.780 g/cm2 DualFemur Neck Left 08/24/2019 54.3 Low Bone Mass -1.8 0.792 g/cm2   DualFemur Neck Right 07/07/2022 57.2 Low Bone Mass -1.9 0.775 g/cm2 DualFemur Neck Right 08/24/2019 54.3 Low Bone Mass -1.5 0.826 g/cm2   DualFemur Total Mean 07/07/2022 57.2 Low Bone Mass -1.2 0.852 g/cm2 DualFemur Total Mean 08/24/2019 54.3 Low Bone Mass -1.1 0.864 g/cm2   Review of Systems  Constitutional:  Negative for fatigue.  HENT:  Positive for mouth dryness. Negative for mouth sores.   Eyes:  Positive for dryness.  Respiratory:  Negative for shortness of breath.   Cardiovascular:  Negative for chest pain and palpitations.  Gastrointestinal:  Negative for blood in stool, constipation and diarrhea.  Endocrine: Negative for increased urination.  Genitourinary:  Negative for involuntary urination.  Musculoskeletal:  Positive for joint pain, joint pain, myalgias, muscle weakness, morning stiffness, muscle tenderness and myalgias. Negative for gait problem and joint swelling.  Skin:  Negative for color change, rash, hair loss and sensitivity to sunlight.  Allergic/Immunologic: Positive for susceptible to infections.  Neurological:  Positive for light-headedness. Negative for dizziness and headaches.  Hematological:  Negative for swollen glands.  Psychiatric/Behavioral:  Positive for sleep disturbance. Negative for depressed mood. The patient is not nervous/anxious.     PMFS History:  Patient Active  Problem List   Diagnosis Date Noted   Post covid-19 condition, unspecified 03/01/2023   Digital mucinous cyst 11/25/2022   Interstitial cystitis 11/24/2022   Positive ANA (antinuclear antibody) 11/24/2022   Arm pain, right 07/14/2022   Dry eyes, bilateral 07/14/2022   Left flank pain, chronic 07/14/2022   Snoring 04/07/2022   Closed fracture of phalanx of right great toe 11/19/2021   Raynaud disease 08/26/2021   renal insufficiency 01/07/2021   Preventative health care 11/26/2020   Varicose veins of both lower extremities 10/28/2020   Hemorrhoids 10/28/2020   Hyperglycemia 04/01/2020   Nonintractable headache 12/31/2019   Right leg pain 11/19/2019   Constipation 11/17/2019   Neck pain 09/25/2019   Educated about COVID-19 virus infection 06/21/2019   Insomnia 02/19/2019   Anxiety and depression 01/13/2019   Perimenopausal 01/02/2018   History of Helicobacter pylori infection 12/28/2017   Nocturia 07/19/2016   Rash and nonspecific skin eruption 03/21/2016   Post concussive syndrome 01/22/2016   Osteoarthritis 07/08/2015   Hematuria 01/20/2015   Hyperlipidemia, mild 01/20/2015   Seasonal and perennial allergic rhinitis 01/20/2015   Fatigue 10/24/2014   Sinusitis, acute maxillary 10/11/2014   Arthralgia 08/20/2013   Diverticulosis 07/01/2011   Menopausal state 03/25/2011   Fibroids 10/24/2010   Anemia 03/25/2010   IRRITABLE BOWEL SYNDROME 03/25/2010   Mitral valve disorder 07/09/2007   Allergic asthma,  mild intermittent, uncomplicated 07/09/2007   ESOPHAGEAL REFLUX 07/09/2007   LOW BACK PAIN 12/14/2006    Past Medical History:  Diagnosis Date   Allergic rhinitis    Anemia    Anxiety    Asthma    Contact dermatitis and eczema    COVID-19 12/2022   Diverticulitis    CT Scan   Esophageal reflux    H. pylori infection    Hyperlipidemia, mixed    IBS (irritable bowel syndrome)    IC (interstitial cystitis)    Internal hemorrhoids    Lumbago    Mitral valve  disorders(424.0)    Osteoarthritis 07/08/2015   Post concussive syndrome    Seasonal and perennial allergic rhinitis 01/20/2015    Family History  Problem Relation Age of Onset   Diabetes Mother    Hypertension Mother    Heart attack Mother        MI at age 60   Alcohol abuse Mother        smoker   Heart disease Mother    Dementia Mother    Hypertension Father    Hyperlipidemia Father    Heart attack Father    Lung cancer Father        smoking hx; dx > 50   Colon polyps Sister    Mental illness Sister        anxiety from 9/11 survivors   Varicose Veins Sister    Osteoporosis Sister    Other Sister        hypoglycemia   Varicose Veins Sister    Rheum arthritis Sister        lupus   Pleurisy Sister    Fibromyalgia Sister    Mental retardation Sister        depression   Leukemia Brother        acute; dx 22s   GER disease Son    Colon cancer Paternal Uncle        dx unknown age   Dementia Maternal Grandmother    Heart disease Maternal Grandfather        mi   Cancer Paternal Grandmother        breast/ovairan before age 4   Alcohol abuse Other        Family history   Breast cancer Neg Hx    Past Surgical History:  Procedure Laterality Date   BARTHOLIN CYST MARSUPIALIZATION N/A 12/18/2013   Procedure: BARTHOLIN CYST MARSUPIALIZATION WITH BIOPSY;  Surgeon: Charlie JINNY Flowers, MD;  Location: [facility];  Service: Gynecology;  Laterality: N/A;   CESAREAN SECTION     DILITATION & CURRETTAGE/HYSTROSCOPY WITH NOVASURE ABLATION N/A 10/14/2012   Procedure: DILATATION & CURETTAGE/HYSTEROSCOPY WITH NOVASURE ABLATION;  Surgeon: Charlie JINNY Flowers, MD;  Location: [facility];  Service: Gynecology;  Laterality: N/A;   MOUTH SURGERY     SINUS SURGERY WITH INSTATRAK     Social History   Social History Narrative   Lives at home home with husband and son.   Right-handed.   1 cup caffeine daily.   Immunization History  Administered Date(s) Administered   Influenza Split 01/26/2011,  01/14/2012, 12/30/2012   Influenza Whole 01/25/2001, 01/24/2010   Influenza, Seasonal, Injecte, Preservative Fre 03/18/2023   Influenza,inj,Quad PF,6+ Mos 01/03/2014, 01/08/2015, 01/22/2016, 12/24/2016, 12/28/2017, 12/23/2018, 01/06/2021, 01/23/2022   PFIZER(Purple Top)SARS-COV-2 Vaccination 05/22/2019, 06/12/2019, 02/02/2020   PNEUMOCOCCAL CONJUGATE-20 08/09/2023   Pfizer Covid-19 Vaccine Bivalent Booster 84yrs & up 03/07/2021   Pfizer(Comirnaty)Fall Seasonal Vaccine 12 years and older 03/08/2022   Td  04/27/1998, 12/30/2007   Tdap 01/13/2018   Zoster Recombinant(Shingrix) 03/11/2019, 09/27/2019     Objective: Vital Signs: BP 97/61 (BP Location: Left Arm, Patient Position: Sitting, Cuff Size: Normal)   Pulse 95   Resp 16   Ht 4' 11 (1.499 m)   Wt 107 lb 14.4 oz (48.9 kg)   LMP 06/30/2016   BMI 21.79 kg/m    Physical Exam Eyes:     Conjunctiva/sclera: Conjunctivae normal.  Cardiovascular:     Rate and Rhythm: Normal rate and regular rhythm.  Pulmonary:     Effort: Pulmonary effort is normal.     Breath sounds: Normal breath sounds.  Lymphadenopathy:     Cervical: No cervical adenopathy.  Skin:    General: Skin is warm and dry.  Neurological:     Mental Status: She is alert.  Psychiatric:        Mood and Affect: Mood normal.      Musculoskeletal Exam:  Shoulders full ROM no tenderness or swelling Elbows full ROM no tenderness or swelling Wrists full ROM no tenderness or swelling Fingers full ROM heberdon's nodes,digital mucinous cysts of DIP joints with associated fingernail depression worst right 5th DIP, clear walled cyst overlying left 2nd DIP Knees full ROM, left knee medial tenderness extending somewhat proximally over distal thigh, no effusion Ankles full ROM no tenderness or swelling    Investigation: No additional findings.  Imaging: No results found.  Recent Labs: Lab Results  Component Value Date   WBC 3.5 (L) 07/05/2023   HGB 14.0 07/05/2023    PLT 290.0 07/05/2023   NA 138 07/05/2023   K 4.3 07/05/2023   CL 101 07/05/2023   CO2 29 07/05/2023   GLUCOSE 88 07/05/2023   BUN 12 07/05/2023   CREATININE 0.62 07/05/2023   BILITOT 1.1 07/05/2023   ALKPHOS 100 07/05/2023   AST 20 07/05/2023   ALT 23 07/05/2023   PROT 7.1 07/05/2023   ALBUMIN 4.6 07/05/2023   CALCIUM 9.5 07/05/2023   GFRAA >60 01/09/2019   QFTBGOLDPLUS NEGATIVE 10/22/2020    Speciality Comments: No specialty comments available.  Procedures:  No procedures performed Allergies: Aspirin, Codeine, Doxycycline , Latex, Other, Sulfa antibiotics, Sulfamethoxazole-trimethoprim, Sulfonamide derivatives, Sulphadimidine sodium [sulfamethazine sodium], Ciprofloxacin , Carisoprodol, Clarithromycin, Epinephrine, Fenofibrate , Metronidazole , Nsaids, Peanut (diagnostic), Penicillins, Tolmetin, and Omeprazole    Assessment / Plan:     Visit Diagnoses: Raynaud's disease without gangrene Appears to be more of a primary disorder given longstanding symptoms and lack of complications.  No abnormal capillary structure or digital pitting or telangiectasia findings on exam today.  She has been dealing with this for a long time manages with cold temperature avoidance of medication.  No evidence of digital ischemia requiring medical management at this time.  Primary osteoarthritis involving multiple joints Osteoarthritis of hand and thumb joints with digital mucinous cysts Chronic osteoarthritis in distal finger joints with digital mucinous cysts. Pain and swelling in thumb joint, aggravated by activities. Risk of joint infection if cysts are improperly drained. - Recommend Voltaren gel topically for cyst swelling. - Advise against self-draining cysts to prevent infection, can f/u for this if bothersome - Suggest compression gloves for thumb joint pain during activities. - Provided information on osteoarthritis management and lifestyle modifications.  Interstitial cystitis Irritable bowel  syndrome, unspecified type Fibromyalgia syndrome (consideration) Symptoms suggest mild fibromyalgia. Not classic but some overlap. Treatment is lifestyle-based with possible SNRI use. - Provided information on fibromyalgia management and lifestyle modifications. - Consider SNRI medications if symptoms worsen.  Orders: No orders of the defined types were placed in this encounter.  No orders of the defined types were placed in this encounter.    Follow-Up Instructions: Return if symptoms worsen or fail to improve.   Lonni LELON Ester, MD  Note - This record has been created using [employer].  Chart creation errors have been sought, but may not always  have been located. Such creation errors do not reflect on  the standard of medical care.

## 2023-12-09 ENCOUNTER — Ambulatory Visit: Attending: Internal Medicine | Admitting: Internal Medicine

## 2023-12-09 ENCOUNTER — Encounter: Payer: Self-pay | Admitting: Internal Medicine

## 2023-12-09 VITALS — BP 97/61 | HR 95 | Resp 16 | Ht 59.0 in | Wt 107.9 lb

## 2023-12-09 DIAGNOSIS — I7300 Raynaud's syndrome without gangrene: Secondary | ICD-10-CM

## 2023-12-09 DIAGNOSIS — N3010 Interstitial cystitis (chronic) without hematuria: Secondary | ICD-10-CM

## 2023-12-09 DIAGNOSIS — M150 Primary generalized (osteo)arthritis: Secondary | ICD-10-CM

## 2023-12-09 DIAGNOSIS — R768 Other specified abnormal immunological findings in serum: Secondary | ICD-10-CM

## 2023-12-09 DIAGNOSIS — K589 Irritable bowel syndrome without diarrhea: Secondary | ICD-10-CM | POA: Diagnosis not present

## 2023-12-09 NOTE — Patient Instructions (Addendum)
 I recommend checking out the [employer]  patient-centered guide for fibromyalgia and chronic pain management: https://howell-gardner.net/   Your finger nodule is a digital mucinous cyst which is usually due to finger osteoarthritis aggravated by overuse or trauma at that site. These usually improve without specific intervention, but can be drained if causing a lot of pain or getting scraped open on things.   For osteoarthritis several treatments may be beneficial: - Topical antiinflammatory medicine such as diclofenac or Voltaren can be applied to  affected area as needed. Topical analgesics containing CBD, menthol, or lidocaine  can be tried. - Turmeric has some antiinflammatory effect similar to NSAIDs and may help, if taken as a supplement should not be taken above recommended doses.  - Compressive gloves or sleeve can be helpful to support the joint especially if hurting or swelling with certain activities. - Physical therapy referral can discuss exercises or activity modification to improve symptoms or strength if needed. - Local steroid injection is an option if symptoms become worse and not controlled by the above options.

## 2023-12-14 ENCOUNTER — Ambulatory Visit: Admitting: Medical

## 2023-12-14 VITALS — BP 124/72 | HR 84 | Temp 98.0°F | Resp 16 | Ht 59.0 in | Wt 109.0 lb

## 2023-12-14 DIAGNOSIS — M26609 Unspecified temporomandibular joint disorder, unspecified side: Secondary | ICD-10-CM

## 2023-12-14 DIAGNOSIS — R0789 Other chest pain: Secondary | ICD-10-CM | POA: Diagnosis not present

## 2023-12-14 DIAGNOSIS — F458 Other somatoform disorders: Secondary | ICD-10-CM | POA: Diagnosis not present

## 2023-12-14 DIAGNOSIS — Z7185 Encounter for immunization safety counseling: Secondary | ICD-10-CM | POA: Diagnosis not present

## 2023-12-14 MED ORDER — ALBUTEROL SULFATE HFA 108 (90 BASE) MCG/ACT IN AERS: 2.0000 | INHALATION_SPRAY | Freq: Four times a day (QID) | RESPIRATORY_TRACT | 0 refills | Status: DC | PRN

## 2023-12-14 MED ORDER — MUPIROCIN 2 % EX OINT
1.0000 | TOPICAL_OINTMENT | Freq: Two times a day (BID) | CUTANEOUS | 0 refills | Status: DC
Start: 2023-12-14 — End: 2024-01-24

## 2023-12-14 NOTE — Patient Instructions (Signed)
 Right temporomandibular joint pain with  bruxism Intermittent right-sided jaw pain with electrical sensation, likely stress-induced clenching and possible bruxism. Differential includes TMJ disorder and trigeminal nerve involvement. - Avoid hard foods, gum, and crunchy items. - Consider low-dose ibuprofen for 3-4 days for pain management. - Follow up with your dentist if pain persists. - Consider massage therapy for symptom relief. - If symptoms persist, consider short course of low-dose Flexeril.  Atypical right-sided chest pain, likely musculoskeletal Intermittent right-sided chest pain, likely musculoskeletal, possibly related to physical activity. Previous cardiac evaluation with a calcium score of zero. EKG shows normal sinus rhythm. No ischemia - Echocardiogram active. Pcp ordered. See if can get done - Referral to cardiologist Dr. Pietro for re-evaluation per your request as you do have continued cardiac concerns - Continue low cholesterol diet and healthy lifestyle. - If concerning cardiac symptoms arise, recommend emergency evaluation.  Hep b vaccine thru pharmacy  Small need puncture wound to left dip finger. No infection. Pt has concern for infection. Rx mupirocin  ointment to use bid. If area worsens let me know and will reassess consider oral antibiotic.  Follow up as regular scheduled with pcp or sooner if needed

## 2023-12-14 NOTE — Progress Notes (Unsigned)
 Subjective:    Patient ID: [name], female    DOB: December 17, 1965, 58 y.o.   MRN: [id number]  HPI  [name] is a 58 year old female who presents with right-sided jaw pain and concerns about atypical chest pain.  She experiences an 'electrical kind of feeling' through her right jaw, which is painful and associated with stress. This sensation began approximately two weeks ago, following a period of tightness and pain in the same area. The pain is associated with stress. No popping sounds when chewing, but there is tension in her jaw during sleep. She uses several pillows and is mindful of her head position at night. She has a history of mild sleep apnea and is considering the use of a mouthguard, as her dentist has noted signs of teeth grinding.  She has a history of atypical chest pain, which she reports sometimes occurs with physical activity, such as Pilates. The pain is described as random and located on the right side of her chest. She has previously undergone an echocardiogram and a cardiac CT with a calcium score of zero. She is concerned about her cholesterol levels, which have increased. A recent incident where her son noticed her breathing sounded labored while moving furniture raised her concerns about her heart health. She has a cardiologist, whom she last saw in June 2025.  She reports a recent episode of stress related to personal and professional situations, which she feels may contribute to her jaw pain. She is exploring options such as acupuncture and massage for relief.   The 10-year ASCVD risk score (Arnett DK, et al., 2019) is: 3%   Values used to calculate the score:     Age: 46 years     Clincally relevant sex: Female     Is Non-Hispanic African American: No     Diabetic: No     Tobacco smoker: No     Systolic Blood Pressure: 124 mmHg     Is BP treated: No     HDL Cholesterol: 49.3 mg/dL     Total Cholesterol: 222 mg/dL    Review of Systems   Constitutional:  Negative for chills, fatigue and fever.  Respiratory:  Negative for cough, chest tightness and wheezing.   Cardiovascular:  Negative for chest pain and palpitations.  Gastrointestinal:  Negative for abdominal pain.  Musculoskeletal:  Negative for back pain and joint swelling.         Objective:   Physical Exam        Assessment & Plan:   Patient Instructions  Right temporomandibular joint pain with  bruxism Intermittent right-sided jaw pain with electrical sensation, likely stress-induced clenching and possible bruxism. Differential includes TMJ disorder and trigeminal nerve involvement. - Avoid hard foods, gum, and crunchy items. - Consider low-dose ibuprofen for 3-4 days for pain management. - Follow up with your dentist if pain persists. - Consider massage therapy for symptom relief. - If symptoms persist, consider short course of low-dose Flexeril.  Atypical right-sided chest pain, likely musculoskeletal Intermittent right-sided chest pain, likely musculoskeletal, possibly related to physical activity. Previous cardiac evaluation with a calcium score of zero. EKG shows normal sinus rhythm. No ischemia - Echocardiogram active. Pcp ordered. See if can get done - Referral to cardiologist Dr. Pietro for re-evaluation per your request as you do have continued cardiac concerns - Continue low cholesterol diet and healthy lifestyle. - If concerning cardiac symptoms arise, recommend emergency evaluation.  Hep b vaccine thru pharmacy  Follow up as  regular scheduled with pcp or sooner if needed

## 2023-12-15 ENCOUNTER — Telehealth: Payer: Self-pay

## 2023-12-15 DIAGNOSIS — I7300 Raynaud's syndrome without gangrene: Secondary | ICD-10-CM

## 2023-12-15 DIAGNOSIS — R739 Hyperglycemia, unspecified: Secondary | ICD-10-CM

## 2023-12-15 DIAGNOSIS — N186 End stage renal disease: Secondary | ICD-10-CM

## 2023-12-15 DIAGNOSIS — D649 Anemia, unspecified: Secondary | ICD-10-CM

## 2023-12-15 DIAGNOSIS — Z0184 Encounter for antibody response examination: Secondary | ICD-10-CM

## 2023-12-15 DIAGNOSIS — E785 Hyperlipidemia, unspecified: Secondary | ICD-10-CM

## 2023-12-15 NOTE — Addendum Note (Signed)
 Addended by: DORINA DALLAS HERO on: 12/15/2023 06:31 AM   Modules accepted: Orders

## 2023-12-15 NOTE — Telephone Encounter (Unsigned)
 Copied from CRM #[id number]. Topic: Clinical - Request for Lab/Test Order >> Dec 15, 2023  2:15 PM Deleta RAMAN wrote: Reason for CRM: patient would like to know if she need blood work before appointment on 9/29 she will need to have labs done before 09/08

## 2023-12-15 NOTE — Telephone Encounter (Signed)
 Copied from CRM [id number]. Topic: Appointments - Appointment Info/Confirmation >> Dec 15, 2023  8:13 AM [name] wrote: Patient/patient representative is calling for information regarding an appointment.  Patient called wanting to know if she can get blood work done before the end of August for her 6 month appointment and she want to discuss an echocardiogram order  [phone]

## 2023-12-16 NOTE — Telephone Encounter (Signed)
 Pt called & appt made , labs placed

## 2023-12-16 NOTE — Addendum Note (Signed)
 Addended by: DORLENE CHIQUITA RAMAN on: 12/16/2023 09:09 AM   Modules accepted: Orders

## 2023-12-16 NOTE — Addendum Note (Signed)
 Addended by: DORLENE CHIQUITA RAMAN on: 12/16/2023 09:03 AM   Modules accepted: Orders

## 2023-12-24 ENCOUNTER — Other Ambulatory Visit (INDEPENDENT_AMBULATORY_CARE_PROVIDER_SITE_OTHER)

## 2023-12-24 DIAGNOSIS — N186 End stage renal disease: Secondary | ICD-10-CM | POA: Diagnosis not present

## 2023-12-24 DIAGNOSIS — I7300 Raynaud's syndrome without gangrene: Secondary | ICD-10-CM

## 2023-12-24 DIAGNOSIS — D649 Anemia, unspecified: Secondary | ICD-10-CM | POA: Diagnosis not present

## 2023-12-24 DIAGNOSIS — R739 Hyperglycemia, unspecified: Secondary | ICD-10-CM

## 2023-12-24 DIAGNOSIS — E785 Hyperlipidemia, unspecified: Secondary | ICD-10-CM | POA: Diagnosis not present

## 2023-12-24 DIAGNOSIS — Z0184 Encounter for antibody response examination: Secondary | ICD-10-CM | POA: Diagnosis not present

## 2023-12-24 LAB — COMPREHENSIVE METABOLIC PANEL WITH GFR
ALT: 27 U/L (ref 0–35)
AST: 29 U/L (ref 0–37)
Albumin: 4.3 g/dL (ref 3.5–5.2)
Alkaline Phosphatase: 106 U/L (ref 39–117)
BUN: 11 mg/dL (ref 6–23)
CO2: 29 meq/L (ref 19–32)
Calcium: 9.1 mg/dL (ref 8.4–10.5)
Chloride: 101 meq/L (ref 96–112)
Creatinine, Ser: 0.61 mg/dL (ref 0.40–1.20)
GFR: 98.37 mL/min (ref >60.00)
Glucose, Bld: 123 mg/dL — ABNORMAL HIGH (ref 70–99)
Potassium: 4 meq/L (ref 3.5–5.1)
Sodium: 139 meq/L (ref 135–145)
Total Bilirubin: 1.4 mg/dL — ABNORMAL HIGH (ref 0.2–1.2)
Total Protein: 6.9 g/dL (ref 6.0–8.3)

## 2023-12-24 LAB — CBC
HCT: 40.1 % (ref 36.0–46.0)
Hemoglobin: 13.7 g/dL (ref 12.0–15.0)
MCHC: 34.1 g/dL (ref 30.0–36.0)
MCV: 97.6 fl (ref 78.0–100.0)
Platelets: 262 K/uL (ref 150.0–400.0)
RBC: 4.11 Mil/uL (ref 3.87–5.11)
RDW: 12 % (ref 11.5–15.5)
WBC: 2.7 K/uL — ABNORMAL LOW (ref 4.0–10.5)

## 2023-12-24 LAB — LIPID PANEL
Cholesterol: 200 mg/dL (ref 0–200)
HDL: 47.5 mg/dL (ref >39.00)
LDL Cholesterol: 131 mg/dL — ABNORMAL HIGH (ref 0–99)
NonHDL: 152.52
Total CHOL/HDL Ratio: 4
Triglycerides: 107 mg/dL (ref 0.0–149.0)
VLDL: 21.4 mg/dL (ref 0.0–40.0)

## 2023-12-24 LAB — TSH: TSH: 1.6 u[IU]/mL (ref 0.35–5.50)

## 2023-12-24 LAB — HEMOGLOBIN A1C: Hgb A1c MFr Bld: 5.5 % (ref 4.6–6.5)

## 2023-12-25 LAB — HEPATITIS B SURFACE ANTIBODY, QUANTITATIVE: Hep B S AB Quant (Post): <5 m[IU]/mL — ABNORMAL LOW (ref >=10)

## 2023-12-26 ENCOUNTER — Ambulatory Visit: Payer: Self-pay | Admitting: Family Medicine

## 2023-12-28 ENCOUNTER — Telehealth: Payer: Self-pay

## 2023-12-28 ENCOUNTER — Ambulatory Visit: Payer: Self-pay

## 2023-12-28 NOTE — Telephone Encounter (Signed)
 Copied from CRM [id number]. Topic: Clinical - Lab/Test Results >> Dec 28, 2023  8:55 AM Antwanette L wrote: Reason for CRM: The patient has questions about her lab visit from 8/29. Before her lab visit, the patient had 2 cups of tea and a honey lozenges. The patient wants to know if this will effect her results. Please follow up w/ the patient at [phone] >> Dec 28, 2023  9:01 AM Antwanette L wrote: The patient wants to know if she should go ahead and schedule a flu shot and wait to do the hepatitis b shot?

## 2023-12-28 NOTE — Telephone Encounter (Signed)
 FYI Only or Action Required?: Action required by provider: requesting medication.  Patient was last seen in primary care on 12/14/2023 by Mallory Loving, PA-C.  Called Nurse Triage reporting Headache and Nasal Congestion.  Symptoms began 2 weeks ago.  Interventions attempted: OTC medications: saline spray and Rest, hydration, or home remedies.  Symptoms are: unchanged.  Triage Disposition: See HCP Within 4 Hours (Or PCP Triage)-refused possible appointment-wants medication sent in.  Patient/caregiver understands and will follow disposition?: No, wishes to speak with PCP  Copied from CRM #[id number]. Topic: Clinical - Red Word Triage >> Dec 28, 2023  9:02 AM [name] wrote: Red Word that prompted transfer to Nurse Triage: Patient is dealing with a sinus infection. Every time the patient blows her nose she has yellow drainage. Patient is requesting meds Reason for Disposition  [1] Redness or swelling on the cheek, forehead or around the eye AND [2] no fever  Answer Assessment - Initial Assessment Questions 1. LOCATION: Where does it hurt?      By cheek bones and above eyebrows-sinus pain 2. ONSET: When did the sinus pain start?  (e.g., hours, days)      Started initially a week ago 3. SEVERITY: How bad is the pain?   (Scale 0-10; or none, mild, moderate or severe)     moderate 4. RECURRENT SYMPTOM: Have you ever had sinus problems before? If Yes, ask: When was the last time? and What happened that time?      Yes-patient reports she has sinus problems regularly.  5. NASAL CONGESTION: Is the nose blocked? If Yes, ask: Can you open it or must you breathe through your mouth?     Nose is blocked-patient reports breathing through her mouth 6. NASAL DISCHARGE: Do you have discharge from your nose? If so ask, What color?     Yes-yellow discharge 7. FEVER: Do you have a fever? If Yes, ask: What is it, how was it measured, and when did it start?      No  temperature-97.5 8. OTHER SYMPTOMS: Do you have any other symptoms? (e.g., sore throat, cough, earache, difficulty breathing)     Cough, sore throat.  Patient states symptoms have been going on for a couple of weeks. Patient is starting a new job on next Monday and is wanting to get started on medication for possible sinus infection. Patient endorses she doesn't want to come into the office-was seen on 8/19 and states she told the provider she was having sinus concerns. Patient is asking for a phone call back from staff.  Protocols used: Sinus Pain or Congestion-A-AH

## 2023-12-28 NOTE — Progress Notes (Signed)
 Patient reviewed via MyChart.

## 2023-12-29 ENCOUNTER — Telehealth: Payer: Self-pay

## 2023-12-29 NOTE — Telephone Encounter (Signed)
 Patient was advised and scheduled appointment on 12/30/23.  Copied from CRM [id number]. Topic: Clinical - Red Word Triage >> Dec 28, 2023  9:02 AM Antwanette L wrote: Red Word that prompted transfer to Nurse Triage: Patient is dealing with a sinus infection. Every time the patient blows her nose she has yellow drainage. Patient is requesting meds >> Dec 29, 2023 12:02 PM Chiquita SQUIBB wrote: Patient is calling in again regarding getting a Z pack for her sinus infection. Please advise patient.

## 2023-12-29 NOTE — Progress Notes (Unsigned)
 MyChart Video Visit    Virtual Visit via Video Note    Patient location: Home. Patient and provider in visit Provider location: Office  I discussed the limitations of evaluation and management by telemedicine and the availability of in person appointments. The patient expressed understanding and agreed to proceed.  Visit Date: 12/30/2023  Today's healthcare provider: Harlene LITTIE Jolly, NP     Subjective:    Patient ID: [name], female    DOB: [birth year], 58 y.o.   MRN: [id number]  No chief complaint on file.   HPI   [name] is 58 y.o. female here for possible sinus infection.  Duration: {NUMBER 1-10:22536} {TIME; HOURS/DAY/DAYS/WEEK/WEEKS:19187} Symptoms include: {Symptoms; sinusitis:15001} Denies: {Symptoms; sinusitis:15001} Treatment to date: ***  Sick contacts? {yes/no:20286}  ROS:  Const: Denies fevers HEENT: As noted in HPI     Past Medical History:  Diagnosis Date   Allergic rhinitis    Anemia    Anxiety    Asthma    Contact dermatitis and eczema    COVID-19 12/2022   Diverticulitis    CT Scan   Esophageal reflux    H. pylori infection    Hyperlipidemia, mixed    IBS (irritable bowel syndrome)    IC (interstitial cystitis)    Internal hemorrhoids    Lumbago    Mitral valve disorders(424.0)    Osteoarthritis 07/08/2015   Post concussive syndrome    Seasonal and perennial allergic rhinitis 01/20/2015    Past Surgical History:  Procedure Laterality Date   BARTHOLIN CYST MARSUPIALIZATION N/A 12/18/2013   Procedure: BARTHOLIN CYST MARSUPIALIZATION WITH BIOPSY;  Surgeon: Charlie JINNY Flowers, MD;  Location: [facility];  Service: Gynecology;  Laterality: N/A;   CESAREAN SECTION     DILITATION & CURRETTAGE/HYSTROSCOPY WITH NOVASURE ABLATION N/A 10/14/2012   Procedure: DILATATION & CURETTAGE/HYSTEROSCOPY WITH NOVASURE ABLATION;  Surgeon: Charlie JINNY Flowers, MD;  Location: [facility];  Service: Gynecology;  Laterality: N/A;   MOUTH SURGERY      SINUS SURGERY WITH INSTATRAK      Family History  Problem Relation Age of Onset   Diabetes Mother    Hypertension Mother    Heart attack Mother        MI at age 44   Alcohol abuse Mother        smoker   Heart disease Mother    Dementia Mother    Hypertension Father    Hyperlipidemia Father    Heart attack Father    Lung cancer Father        smoking hx; dx > 50   Colon polyps Sister    Mental illness Sister        anxiety from 9/11 survivors   Varicose Veins Sister    Osteoporosis Sister    Other Sister        hypoglycemia   Varicose Veins Sister    Rheum arthritis Sister        lupus   Pleurisy Sister    Fibromyalgia Sister    Mental retardation Sister        depression   Leukemia Brother        acute; dx 64s   GER disease Son    Colon cancer Paternal Uncle        dx unknown age   Dementia Maternal Grandmother    Heart disease Maternal Grandfather        mi   Cancer Paternal Grandmother        breast/ovairan before age  50   Alcohol abuse Other        Family history   Breast cancer Neg Hx     Social History   Socioeconomic History   Marital status: Married    Spouse name: Not on file   Number of children: 2   Years of education: Masters   Highest education level: Master's degree (e.g., MA, MS, MEng, MEd, MSW, MBA)  Occupational History   Occupation: Clinical cytogeneticist  Tobacco Use   Smoking status: Never    Passive exposure: Past   Smokeless tobacco: Never  Vaping Use   Vaping status: Never Used  Substance and Sexual Activity   Alcohol use: Not Currently   Drug use: No   Sexual activity: Yes    Comment: lives with husband and son, work with [employer] as a Psychologist, educational, aoivd gluten, dairy  Other Topics Concern   Not on file  Social History Narrative   Lives at home home with husband and son.   Right-handed.   1 cup caffeine daily.   Social Drivers of Corporate investment banker Strain: Low Risk  (12/14/2023)   Overall Financial  Resource Strain (CARDIA)    Difficulty of Paying Living Expenses: Not very hard  Food Insecurity: No Food Insecurity (12/14/2023)   Hunger Vital Sign    Worried About Running Out of Food in the Last Year: Never true    Ran Out of Food in the Last Year: Never true  Transportation Needs: No Transportation Needs (12/14/2023)   PRAPARE - Administrator, Civil Service (Medical): No    Lack of Transportation (Non-Medical): No  Physical Activity: Sufficiently Active (12/14/2023)   Exercise Vital Sign    Days of Exercise per Week: 4 days    Minutes of Exercise per Session: 40 min  Stress: No Stress Concern Present (12/14/2023)   [employer] of Occupational Health - Occupational Stress Questionnaire    Feeling of Stress: Only a little  Recent Concern: Stress - Stress Concern Present (09/29/2023)   [employer] of Occupational Health - Occupational Stress Questionnaire    Feeling of Stress : To some extent  Social Connections: Moderately Isolated (12/14/2023)   Social Connection and Isolation Panel    Frequency of Communication with Friends and Family: Once a week    Frequency of Social Gatherings with Friends and Family: Once a week    Attends Religious Services: 1 to 4 times per year    Active Member of [employer] or Organizations: No    Attends Engineer, structural: Not on file    Marital Status: Living with partner  Intimate Partner Violence: Not on file    Outpatient Medications Prior to Visit  Medication Sig Dispense Refill   albuterol  (VENTOLIN  HFA) 108 (90 Base) MCG/ACT inhaler Inhale 2 puffs into the lungs every 6 (six) hours as needed. 18 g 0   azelastine  (ASTELIN ) 0.1 % nasal spray Place 2 sprays into both nostrils 2 (two) times daily. Use in each nostril as directed (Patient taking differently: Place 2 sprays into both nostrils as needed. Use in each nostril as directed) 30 mL 12   Multiple Vitamins-Minerals (MULTIVITAMIN ADULT PO) Take 1 tablet by mouth daily.      mupirocin  ointment (BACTROBAN ) 2 % Apply 1 Application topically 2 (two) times daily. 22 g 0   OVER THE COUNTER MEDICATION Calcium     OVER THE COUNTER MEDICATION Omega 3 2100     Probiotic Product (PROBIOTIC DAILY PO)  Take 1 tablet by mouth daily.     sodium chloride  (OCEAN) 0.65 % SOLN nasal spray Place 1 spray into both nostrils as needed for congestion.     VITAMIN D PO Take 1 tablet by mouth daily.     Facility-Administered Medications Prior to Visit  Medication Dose Route Frequency Provider Last Rate Last Admin   methylPREDNISolone  acetate (DEPO-MEDROL ) injection 80 mg  80 mg Intramuscular Once Neysa Rama D, MD        Allergies  Allergen Reactions   Aspirin Shortness Of Breath    irritated stomach Ibuprofen and Naproxen do not agree with her either   Codeine Hives, Shortness Of Breath and Other (See Comments)    Does not take percocet or hydrocodone ; tylenol  only   Doxycycline  Other (See Comments)    Headache after one dose   Latex Shortness Of Breath, Itching and Other (See Comments)   Other Shortness Of Breath and Other (See Comments)    peanuts   Sulfa Antibiotics Hives and Shortness Of Breath   Sulfamethoxazole-Trimethoprim Shortness Of Breath   Sulfonamide Derivatives Shortness Of Breath and Itching   Sulphadimidine Sodium [Sulfamethazine Sodium] Shortness Of Breath    Added to food for preservative   Ciprofloxacin  Other (See Comments)    REACTION: SOB, Tightness in head REACTION: SOB, Tightness in head Syncope    Carisoprodol    Clarithromycin     Doesn't agree with me   Epinephrine     Heart racing   Fenofibrate  Other (See Comments)    Leg cramps, body aches    Metronidazole  Other (See Comments)    cannot tolerate   Nsaids    Peanut (Diagnostic) Other (See Comments)    other   Penicillins     Doesn't remember reaction   Tolmetin    Omeprazole  Palpitations    ROS See HPI    Objective:    Physical Exam  General: Patient appears  well-nourished and in no acute distress. Alert and oriented to person and place. Engaged with provider throughout visit  HEENT: Head atraumatic and normocephalic. No facial asymmetry.. Pupils appear equal and reactive.  Respiratory: Breathing unlabored. No cough, wheezing, or respiratory distress observed. Respiratory rate appears normal. Neurological: Patient alert and oriented. Speech coherent. No facial droop, slurred speech, or focal deficits observed. Psychiatric: Mood and affect assessed via video. Patient cooperative. Thought process logical/coherent***   LMP 06/30/2016  Wt Readings from Last 3 Encounters:  12/14/23 109 lb (49.4 kg)  12/09/23 107 lb 14.4 oz (48.9 kg)  10/01/23 110 lb 12.8 oz (50.3 kg)       Assessment & Plan:   Problem List Items Addressed This Visit   None   Orders as above. Discussed abx use and that >95% of sinus infections are viral. OTC remedies such as PO antihistamines and INCS spray recommended and listed in AVS. Continue to practice good hand hygiene, push fluids, and cover mouth when coughing. F/u prn. The patient voiced understanding and agreement to the plan.  Harlene LITTIE Jolly, DNP, AGNP-C   I am having [name] maintain her Probiotic Product (PROBIOTIC DAILY PO), OVER THE COUNTER MEDICATION, Multiple Vitamins-Minerals (MULTIVITAMIN ADULT PO), sodium chloride , OVER THE COUNTER MEDICATION, azelastine , VITAMIN D PO, mupirocin  ointment, and albuterol . We will continue to administer methylPREDNISolone  acetate.  No orders of the defined types were placed in this encounter.   I discussed the assessment and treatment plan with the patient. The patient was provided an opportunity to ask questions and all were  answered. The patient agreed with the plan and demonstrated an understanding of the instructions.   The patient was advised to call back or seek an in-person evaluation if the symptoms worsen or if the condition fails to improve as  anticipated.  I provided *** minutes of face-to-face time during this encounter.   Harlene LITTIE Jolly, NP [facility] [phone] (phone) [phone] (fax)  [facility]

## 2023-12-30 ENCOUNTER — Encounter: Payer: Self-pay | Admitting: Student

## 2023-12-30 ENCOUNTER — Telehealth: Admitting: Student

## 2023-12-30 ENCOUNTER — Telehealth: Payer: Self-pay

## 2023-12-30 DIAGNOSIS — J069 Acute upper respiratory infection, unspecified: Secondary | ICD-10-CM | POA: Diagnosis not present

## 2023-12-30 DIAGNOSIS — Z23 Encounter for immunization: Secondary | ICD-10-CM

## 2023-12-30 MED ORDER — MONTELUKAST SODIUM 5 MG PO CHEW: 5.0000 mg | CHEWABLE_TABLET | Freq: Every day | ORAL | 3 refills | Status: AC

## 2023-12-30 MED ORDER — NOVAVAX COVID-19 VACCINE 5 MCG/0.5ML IM SUSP: 5.0000 ug | Freq: Once | INTRAMUSCULAR | 0 refills | Status: AC

## 2023-12-30 MED ORDER — FLUTICASONE PROPIONATE 50 MCG/ACT NA SUSP
1.0000 | Freq: Every day | NASAL | 3 refills | Status: AC
Start: 2023-12-30 — End: ?

## 2023-12-30 NOTE — Telephone Encounter (Signed)
 Copied from CRM #[id number]. Topic: Clinical - Prescription Issue >> Dec 30, 2023 11:46 AM [place] J wrote: Reason for CRM: A pharmacist from [facility] is calling to let us  know that they do not carry the NOVAVAX vaccination. She also said that it's highly likely that other pharmacies do not carry that kind of COVID vaccination either. Pharmacist wanted to let Dr Domenica know that due to past reactions, the only kind of COVID vaccination the patient can have administered is NOVAVAX and so the patient will need to be informed of this.

## 2023-12-30 NOTE — Telephone Encounter (Signed)
 Copied from CRM #[id number]. Topic: Clinical - Medication Question >> Dec 30, 2023  3:29 PM Lauren C wrote: Reason for CRM: Pt requesting 3rd covid vaccine Novavax. She was able to confirm that [facility] is available at [facility] off [address]. Requesting [facility] that [facility] or any other location near her.

## 2023-12-30 NOTE — Telephone Encounter (Signed)
 Spoke with patient and she was advised by the pharmacy already. [facility] and  wanted to see if you could send it to [facility] pharmacy for now.

## 2023-12-31 ENCOUNTER — Other Ambulatory Visit: Payer: Self-pay | Admitting: Family

## 2023-12-31 MED ORDER — COVID-19 SUBUNIT VACC-NOVAVAX 5 MCG/0.5ML IM SUSP: 5.0000 ug | Freq: Once | INTRAMUSCULAR | 0 refills | Status: AC

## 2024-01-05 ENCOUNTER — Ambulatory Visit: Admitting: Cardiology

## 2024-01-07 NOTE — Telephone Encounter (Signed)
 Ok to write letter for sneakers?

## 2024-01-19 ENCOUNTER — Telehealth: Payer: Self-pay | Admitting: Family Medicine

## 2024-01-19 NOTE — Telephone Encounter (Signed)
 Pt's husband dropped off forms to be completed by pcp. Pt asks that they be called when forms are ready to be picked up. Placed forms in pcp's bin in front office.

## 2024-01-21 NOTE — Telephone Encounter (Signed)
 Paperwork was placed in Dr. Elisabeth folder.

## 2024-01-23 NOTE — Assessment & Plan Note (Signed)
 Hydrate and monitor

## 2024-01-23 NOTE — Assessment & Plan Note (Signed)
 Encourage heart healthy diet such as MIND or DASH diet, increase exercise, avoid trans fats, simple carbohydrates and processed foods, consider a krill or fish or flaxseed oil cap daily.

## 2024-01-23 NOTE — Assessment & Plan Note (Signed)
 hgba1c acceptable, minimize simple carbs. Increase exercise as tolerated.

## 2024-01-23 NOTE — Assessment & Plan Note (Signed)
Avoid offending foods, start probiotics. Do not eat large meals in late evening and consider raising head of bed.  

## 2024-01-23 NOTE — Assessment & Plan Note (Signed)
 Encouraged good sleep hygiene such as dark, quiet room. No blue/green glowing lights such as computer screens in bedroom. No alcohol or stimulants in evening. Cut down on caffeine as able. Regular exercise is helpful but not just prior to bed time.

## 2024-01-24 ENCOUNTER — Ambulatory Visit: Admitting: Family Medicine

## 2024-01-24 ENCOUNTER — Encounter: Payer: Self-pay | Admitting: Family Medicine

## 2024-01-24 ENCOUNTER — Ambulatory Visit (HOSPITAL_BASED_OUTPATIENT_CLINIC_OR_DEPARTMENT_OTHER)

## 2024-01-24 VITALS — BP 110/70 | HR 72 | Resp 16 | Ht 59.0 in | Wt 107.2 lb

## 2024-01-24 DIAGNOSIS — E785 Hyperlipidemia, unspecified: Secondary | ICD-10-CM

## 2024-01-24 DIAGNOSIS — N186 End stage renal disease: Secondary | ICD-10-CM

## 2024-01-24 DIAGNOSIS — K219 Gastro-esophageal reflux disease without esophagitis: Secondary | ICD-10-CM

## 2024-01-24 DIAGNOSIS — Z23 Encounter for immunization: Secondary | ICD-10-CM | POA: Diagnosis not present

## 2024-01-24 DIAGNOSIS — G4700 Insomnia, unspecified: Secondary | ICD-10-CM | POA: Diagnosis not present

## 2024-01-24 DIAGNOSIS — R300 Dysuria: Secondary | ICD-10-CM

## 2024-01-24 DIAGNOSIS — R739 Hyperglycemia, unspecified: Secondary | ICD-10-CM

## 2024-01-24 LAB — POCT URINALYSIS DIPSTICK
Bilirubin, UA: NEGATIVE
Glucose, UA: NEGATIVE
Ketones, UA: NEGATIVE
Leukocytes, UA: NEGATIVE
Nitrite, UA: NEGATIVE
Protein, UA: NEGATIVE
Spec Grav, UA: <=1.005 — AB (ref 1.010–1.025)
Urobilinogen, UA: 0.2 U/dL
pH, UA: 6 (ref 5.0–8.0)

## 2024-01-24 MED ORDER — AMOXICILLIN-POT CLAVULANATE 875-125 MG PO TABS: 1.0000 | ORAL_TABLET | Freq: Two times a day (BID) | ORAL | 0 refills | Status: DC

## 2024-01-24 NOTE — Patient Instructions (Signed)

## 2024-01-25 ENCOUNTER — Encounter: Payer: Self-pay | Admitting: Family Medicine

## 2024-01-25 ENCOUNTER — Ambulatory Visit: Payer: Self-pay | Admitting: Family Medicine

## 2024-01-25 LAB — URINE CULTURE
MICRO NUMBER:: 17029643
Result:: NO GROWTH
SPECIMEN QUALITY:: ADEQUATE

## 2024-01-25 NOTE — Progress Notes (Signed)
 Subjective:    Patient ID: [name], female    DOB: Mar 18, 1966, 58 y.o.   MRN: [id number]  Chief Complaint  Patient presents with   Medical Management of Chronic Issues    Patient presents today for a 3 month follow-up.   Quality Metric Gaps    Hep B vaccines    HPI Discussed the use of AI scribe software for clinical note transcription with the patient, who gave verbal consent to proceed.  History of Present Illness [name] is a 58 year old female who presents to discuss stress and accommodations at her workplace.  She experiences chronic pain in her feet, legs, joints, and back. No recent trauma or injury. Her workplace requires paperwork to make accommodations for her to use sneakers with good arch support.  She continues to experience intermittent symptoms of irritable bowel syndrome and interstitial cystitis. She has days with frequent and uncomfortable loose bowel movements and other days with frequent and uncomfortable urinary symptoms. These symptoms tend to flare and then improve, only to flare again.  No recent febrile illness or other acute concerns except for some congestion and allergic symptoms at times. Denies CP/palp/SOB/HA. Taking meds as prescribed     Past Medical History:  Diagnosis Date   Allergic rhinitis    Anemia    Anxiety    Asthma    Contact dermatitis and eczema    COVID-19 12/2022   Diverticulitis    CT Scan   Esophageal reflux    H. pylori infection    Hyperlipidemia, mixed    IBS (irritable bowel syndrome)    IC (interstitial cystitis)    Internal hemorrhoids    Lumbago    Mitral valve disorders(424.0)    Osteoarthritis 07/08/2015   Post concussive syndrome    Seasonal and perennial allergic rhinitis 01/20/2015    Past Surgical History:  Procedure Laterality Date   BARTHOLIN CYST MARSUPIALIZATION N/A 12/18/2013   Procedure: BARTHOLIN CYST MARSUPIALIZATION WITH BIOPSY;  Surgeon: Charlie JINNY Flowers, MD;  Location: [facility];   Service: Gynecology;  Laterality: N/A;   CESAREAN SECTION     DILITATION & CURRETTAGE/HYSTROSCOPY WITH NOVASURE ABLATION N/A 10/14/2012   Procedure: DILATATION & CURETTAGE/HYSTEROSCOPY WITH NOVASURE ABLATION;  Surgeon: Charlie JINNY Flowers, MD;  Location: [facility];  Service: Gynecology;  Laterality: N/A;   MOUTH SURGERY     SINUS SURGERY WITH INSTATRAK      Family History  Problem Relation Age of Onset   Diabetes Mother    Hypertension Mother    Heart attack Mother        MI at age 1   Alcohol abuse Mother        smoker   Heart disease Mother    Dementia Mother    Hypertension Father    Hyperlipidemia Father    Heart attack Father    Lung cancer Father        smoking hx; dx > 50   Colon polyps Sister    Mental illness Sister        anxiety from 9/11 survivors   Varicose Veins Sister    Osteoporosis Sister    Other Sister        hypoglycemia   Varicose Veins Sister    Rheum arthritis Sister        lupus   Pleurisy Sister    Fibromyalgia Sister    Mental retardation Sister        depression   Leukemia Brother  acute; dx 63s   GER disease Son    Colon cancer Paternal Uncle        dx unknown age   Dementia Maternal Grandmother    Heart disease Maternal Grandfather        mi   Cancer Paternal Grandmother        breast/ovairan before age 22   Alcohol abuse Other        Family history   Breast cancer Neg Hx     Social History   Socioeconomic History   Marital status: Married    Spouse name: Not on file   Number of children: 2   Years of education: Masters   Highest education level: Master's degree (e.g., MA, MS, MEng, MEd, MSW, MBA)  Occupational History   Occupation: Clinical cytogeneticist  Tobacco Use   Smoking status: Never    Passive exposure: Past   Smokeless tobacco: Never  Vaping Use   Vaping status: Never Used  Substance and Sexual Activity   Alcohol use: Not Currently   Drug use: No   Sexual activity: Yes    Comment: lives with  husband and son, work with [employer] as a Psychologist, educational, aoivd gluten, dairy  Other Topics Concern   Not on file  Social History Narrative   Lives at home home with husband and son.   Right-handed.   1 cup caffeine daily.   Social Drivers of Corporate investment banker Strain: Low Risk  (12/14/2023)   Overall Financial Resource Strain (CARDIA)    Difficulty of Paying Living Expenses: Not very hard  Food Insecurity: No Food Insecurity (12/14/2023)   Hunger Vital Sign    Worried About Running Out of Food in the Last Year: Never true    Ran Out of Food in the Last Year: Never true  Transportation Needs: No Transportation Needs (12/14/2023)   PRAPARE - Administrator, Civil Service (Medical): No    Lack of Transportation (Non-Medical): No  Physical Activity: Sufficiently Active (12/14/2023)   Exercise Vital Sign    Days of Exercise per Week: 4 days    Minutes of Exercise per Session: 40 min  Stress: No Stress Concern Present (12/14/2023)   [employer] of Occupational Health - Occupational Stress Questionnaire    Feeling of Stress: Only a little  Recent Concern: Stress - Stress Concern Present (09/29/2023)   [employer] of Occupational Health - Occupational Stress Questionnaire    Feeling of Stress : To some extent  Social Connections: Moderately Isolated (12/14/2023)   Social Connection and Isolation Panel    Frequency of Communication with Friends and Family: Once a week    Frequency of Social Gatherings with Friends and Family: Once a week    Attends Religious Services: 1 to 4 times per year    Active Member of [employer] or Organizations: No    Attends Engineer, structural: Not on file    Marital Status: Living with partner  Intimate Partner Violence: Not on file    Outpatient Medications Prior to Visit  Medication Sig Dispense Refill   albuterol  (VENTOLIN  HFA) 108 (90 Base) MCG/ACT inhaler Inhale 2 puffs into the lungs every 6 (six) hours as needed. 18 g 0    azelastine  (ASTELIN ) 0.1 % nasal spray Place 2 sprays into both nostrils 2 (two) times daily. Use in each nostril as directed (Patient taking differently: Place 2 sprays into both nostrils as needed. Use in each nostril as directed) 30 mL 12  fluticasone  (FLONASE ) 50 MCG/ACT nasal spray Place 1-2 sprays into both nostrils daily. 16 g 3   montelukast  (SINGULAIR ) 5 MG chewable tablet Chew 1 tablet (5 mg total) by mouth at bedtime. 30 tablet 3   Multiple Vitamins-Minerals (MULTIVITAMIN ADULT PO) Take 1 tablet by mouth daily.     OVER THE COUNTER MEDICATION Calcium     OVER THE COUNTER MEDICATION Omega 3 2100     Probiotic Product (PROBIOTIC DAILY PO) Take 1 tablet by mouth daily.     sodium chloride  (OCEAN) 0.65 % SOLN nasal spray Place 1 spray into both nostrils as needed for congestion.     VITAMIN D PO Take 1 tablet by mouth daily.     mupirocin  ointment (BACTROBAN ) 2 % Apply 1 Application topically 2 (two) times daily. 22 g 0   Facility-Administered Medications Prior to Visit  Medication Dose Route Frequency Provider Last Rate Last Admin   methylPREDNISolone  acetate (DEPO-MEDROL ) injection 80 mg  80 mg Intramuscular Once Neysa Rama D, MD        Allergies  Allergen Reactions   Aspirin Shortness Of Breath    irritated stomach Ibuprofen and Naproxen do not agree with her either   Codeine Hives, Shortness Of Breath and Other (See Comments)    Does not take percocet or hydrocodone ; tylenol  only   Doxycycline  Other (See Comments)    Headache after one dose   Latex Shortness Of Breath, Itching and Other (See Comments)   Other Shortness Of Breath and Other (See Comments)    peanuts   Sulfa Antibiotics Hives and Shortness Of Breath   Sulfamethoxazole-Trimethoprim Shortness Of Breath   Sulfonamide Derivatives Shortness Of Breath and Itching   Sulphadimidine Sodium [Sulfamethazine Sodium] Shortness Of Breath    Added to food for preservative   Ciprofloxacin  Other (See Comments)     REACTION: SOB, Tightness in head REACTION: SOB, Tightness in head Syncope    Carisoprodol    Clarithromycin     Doesn't agree with me   Epinephrine     Heart racing   Fenofibrate  Other (See Comments)    Leg cramps, body aches    Metronidazole  Other (See Comments)    cannot tolerate   Nsaids    Peanut (Diagnostic) Other (See Comments)    other   Penicillins     Doesn't remember reaction   Tolmetin    Omeprazole  Palpitations    Review of Systems  Constitutional:  Positive for malaise/fatigue. Negative for fever.  HENT:  Positive for congestion.   Eyes:  Negative for blurred vision.  Respiratory:  Negative for shortness of breath.   Cardiovascular:  Negative for chest pain, palpitations and leg swelling.  Gastrointestinal:  Positive for abdominal pain and diarrhea. Negative for blood in stool and nausea.  Genitourinary:  Positive for frequency and urgency. Negative for dysuria and hematuria.  Musculoskeletal:  Positive for back pain, joint pain and myalgias. Negative for falls.  Skin:  Negative for rash.  Neurological:  Negative for dizziness, loss of consciousness and headaches.  Endo/Heme/Allergies:  Negative for environmental allergies.  Psychiatric/Behavioral:  Negative for depression. The patient is nervous/anxious.        Objective:    Physical Exam Constitutional:      General: She is not in acute distress.    Appearance: Normal appearance. She is well-developed. She is not toxic-appearing.  HENT:     Head: Normocephalic and atraumatic.     Right Ear: External ear normal.     Left Ear:  External ear normal.     Nose: Nose normal.  Eyes:     General:        Right eye: No discharge.        Left eye: No discharge.     Conjunctiva/sclera: Conjunctivae normal.  Neck:     Thyroid : No thyromegaly.  Cardiovascular:     Rate and Rhythm: Normal rate and regular rhythm.     Heart sounds: Normal heart sounds. No murmur heard. Pulmonary:     Effort: Pulmonary  effort is normal. No respiratory distress.     Breath sounds: Normal breath sounds.  Abdominal:     General: Bowel sounds are normal.     Palpations: Abdomen is soft.     Tenderness: There is no abdominal tenderness. There is no guarding.  Musculoskeletal:        General: Normal range of motion.     Cervical back: Neck supple.  Lymphadenopathy:     Cervical: No cervical adenopathy.  Skin:    General: Skin is warm and dry.  Neurological:     Mental Status: She is alert and oriented to person, place, and time.  Psychiatric:        Mood and Affect: Mood normal.        Behavior: Behavior normal.        Thought Content: Thought content normal.        Judgment: Judgment normal.     BP 110/70   Pulse 72   Resp 16   Ht 4' 11 (1.499 m)   Wt 107 lb 3.2 oz (48.6 kg)   LMP 06/30/2016   SpO2 98%   BMI 21.65 kg/m  Wt Readings from Last 3 Encounters:  01/24/24 107 lb 3.2 oz (48.6 kg)  12/14/23 109 lb (49.4 kg)  12/09/23 107 lb 14.4 oz (48.9 kg)    Diabetic Foot Exam - Simple   No data filed    Lab Results  Component Value Date   WBC 2.7 (L) 12/24/2023   HGB 13.7 12/24/2023   HCT 40.1 12/24/2023   PLT 262.0 12/24/2023   GLUCOSE 123 (H) 12/24/2023   CHOL 200 12/24/2023   TRIG 107.0 12/24/2023   HDL 47.50 12/24/2023   LDLDIRECT 130.0 01/10/2021   LDLCALC 131 (H) 12/24/2023   ALT 27 12/24/2023   AST 29 12/24/2023   NA 139 12/24/2023   K 4.0 12/24/2023   CL 101 12/24/2023   CREATININE 0.61 12/24/2023   BUN 11 12/24/2023   CO2 29 12/24/2023   TSH 1.60 12/24/2023   HGBA1C 5.5 12/24/2023    Lab Results  Component Value Date   TSH 1.60 12/24/2023   Lab Results  Component Value Date   WBC 2.7 (L) 12/24/2023   HGB 13.7 12/24/2023   HCT 40.1 12/24/2023   MCV 97.6 12/24/2023   PLT 262.0 12/24/2023   Lab Results  Component Value Date   NA 139 12/24/2023   K 4.0 12/24/2023   CO2 29 12/24/2023   GLUCOSE 123 (H) 12/24/2023   BUN 11 12/24/2023   CREATININE 0.61  12/24/2023   BILITOT 1.4 (H) 12/24/2023   ALKPHOS 106 12/24/2023   AST 29 12/24/2023   ALT 27 12/24/2023   PROT 6.9 12/24/2023   ALBUMIN 4.3 12/24/2023   CALCIUM 9.1 12/24/2023   ANIONGAP 10 01/09/2019   GFR 98.37 12/24/2023   Lab Results  Component Value Date   CHOL 200 12/24/2023   Lab Results  Component Value Date   HDL 47.50  12/24/2023   Lab Results  Component Value Date   LDLCALC 131 (H) 12/24/2023   Lab Results  Component Value Date   TRIG 107.0 12/24/2023   Lab Results  Component Value Date   CHOLHDL 4 12/24/2023   Lab Results  Component Value Date   HGBA1C 5.5 12/24/2023       Assessment & Plan:  Gastroesophageal reflux disease, unspecified whether esophagitis present Assessment & Plan: Avoid offending foods, start probiotics. Do not eat large meals in late evening and consider raising head of bed.    Hyperglycemia Assessment & Plan: hgba1c acceptable, minimize simple carbs. Increase exercise as tolerated.    Hyperlipidemia, mild Assessment & Plan: Encourage heart healthy diet such as MIND or DASH diet, increase exercise, avoid trans fats, simple carbohydrates and processed foods, consider a krill or fish or flaxseed oil cap daily.    Insomnia, unspecified type Assessment & Plan: Encouraged good sleep hygiene such as dark, quiet room. No blue/green glowing lights such as computer screens in bedroom. No alcohol or stimulants in evening. Cut down on caffeine as able. Regular exercise is helpful but not just prior to bed time.    renal insufficiency Assessment & Plan: Hydrate and monitor    Dysuria -     POCT urinalysis dipstick -     Urine Culture  Need for influenza vaccination -     Flu vaccine trivalent PF, 6mos and older(Flulaval ,Afluria,Fluarix ,Fluzone)  Other orders -     Amoxicillin -Pot Clavulanate; Take 1 tablet by mouth 2 (two) times daily.  Dispense: 20 tablet; Refill: 0    Assessment and Plan Assessment & Plan Chronic  musculoskeletal pain involving foot, leg, joints, and back Chronic musculoskeletal pain affecting the foot, leg, joints, and back without recent trauma or injury. - Advise use of footwear with good arch support to alleviate pain. - Complete paperwork for workplace accommodation for improved footwear.  Interstitial cystitis (chronic) without hematuria Chronic interstitial cystitis with episodes of frequent and uncomfortable urinary symptoms that flare and improve intermittently. She does try to hydrate and avoid offending foods  Irritable bowel syndrome Intermittent symptoms of irritable bowel syndrome with episodes of frequent and uncomfortable loose bowel movements that flare and improve intermittently.  Recording duration: 1 minute     Harlene Horton, MD

## 2024-02-11 ENCOUNTER — Other Ambulatory Visit: Payer: Self-pay | Admitting: Medical

## 2024-02-15 ENCOUNTER — Other Ambulatory Visit: Payer: Self-pay

## 2024-02-15 MED ORDER — ALBUTEROL SULFATE HFA 108 (90 BASE) MCG/ACT IN AERS: 2.0000 | INHALATION_SPRAY | Freq: Four times a day (QID) | RESPIRATORY_TRACT | 3 refills | Status: AC | PRN

## 2024-02-15 NOTE — Telephone Encounter (Signed)
 Refill fax request from [facility] pharmacy for albuterol  HFA.  Last OV 08/09/2023.   - Prescribe Airsupra  as a rescue inhaler. - Refill albuterol  inhaler. - Use Airsupra  or albuterol  before exercise as needed.  Return in about 1 year (around 08/08/2024).   Will okay albuterol  refill.

## 2024-03-15 NOTE — Progress Notes (Signed)
 Patient ID: [name], female    DOB: 02-18-1966, 58 y.o.   MRN: [id number]  HPI female never smoker followed for allergic rhinitis, asthma. Complicated by History anxiety, GERD, IBS Office Spirometry 08/01/2015-within normal. FVC 3.02/112%, FEV1 2.36/104%, FEV1/FVC 0.78, FEF 25-75 percent 2.26/84%. PFT 11/26/2010 within normal limits without significant response to bronchodilator. FEV1/FVC 0.79 HST 05/14/22- AHI 5.8/ hr, desaturation to 91%, body weight 108  --------------------------------------------------------------------------------  08/09/23-58 year old female never smoker followed for Allergic Rhinitis, Asthma.  Complicated by GERD, history Anxiety, IBS, Osteopenia,  HST 05/14/22- AHI 5.8/ hr, desaturation to 91%, body weight 108 lbs -Neb albuterol , albuterol  hfa, benzonatate ,  Agreed no treatment, but consider retest if she wants. Body weight today- ------COVID in September 2024.  Still feels some SOB. Discussed the use of AI scribe software for clinical note transcription with the patient, who gave verbal consent to proceed. History of Present Illness   The patient, with a history of asthma and bronchitis, presents with a pressure feeling in the chest. She is unsure if this is related to her asthma or a result of having had COVID-19. The patient has been exercising more aggressively recently, which she believes could be causing the muscle pain. She also mentions a pain in the chest area that comes and goes. The patient's asthma and bronchitis were exacerbated by a COVID-19 infection. She is currently using albuterol  for her asthma, but it causes tremors. The patient is also considering getting a pneumonia vaccine. We reviewed [facility] info.     Assessment and Plan:    Asthma Asthma exacerbated by COVID-19 in September, now stable with occasional cough likely from allergies and post-nasal drip. Albuterol  causes tremors; Airsupra  discussed as an alternative rescue inhaler. -  Prescribe Airsupra  as a rescue inhaler. - Refill albuterol  inhaler. - Use Airsupra  or albuterol  before exercise as needed.  Intermittent chest pressure Intermittent chest pressure likely due to muscle strain from physical activity. No airway issues suspected. Previous chest x-ray normal. No current COVID-related lung issues. - Consider massage therapy for muscle strain relief. - Monitor symptoms and consider chest x-ray if symptoms persist or worsen.  Pneumonia vaccination Recommended Prevnar 20 due to age and asthma. Discussed potential side effects and protection against Streptococcus pneumoniae. - Administer Prevnar 20 vaccination.     CXR 04/30/23 IMPRESSION: No active cardiopulmonary disease.   03/16/24- 58 year old female never smoker followed for Allergic Rhinitis, Asthma.  Complicated by GERD, history Anxiety, IBS, Osteopenia,  HST 05/14/22- AHI 5.8/ hr, desaturation to 91%, body weight 108 lbs -Neb albuterol , albuterol  hfa, benzonatate ,  Agreed no treatment, but consider retest if she wants. Work note- out of work as requested. Discussed the use of AI scribe software for clinical note transcription with the patient, who gave verbal consent to proceed.  History of Present Illness   [name] is a 58 year old female with asthma who presents with respiratory symptoms and stress-related health concerns.  She has experienced a dry cough, wheezing, and dizziness for the past two weeks. She also has pressure on the side of her head, vertigo, and insomnia. Episodes of feeling very hot occur but subside without fever or green sputum. She has not tested for COVID-19.  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 She has not had a recent chest x-ray, with the last one being a year ago. She has given notice at work and asks a leave of absence note  covering until Dec 2.  She received  a pneumonia vaccine in April and a flu shot in September. She plans to receive the COVID-19 Novavax vaccine once her current symptoms resolve.     Assessment and Plan:    Acute bronchitis with asthma exacerbation Symptoms include dry cough, chest pressure, and wheezing, likely exacerbated by occupational stress. Viral infection, possibly COVID-19, considered due to recent exposure. - Ordered COVID-19 test. - Ordered chest x-ray. - Prescribed azithromycin  (Z-Pak) to hold. - Advised to monitor symptoms and report if no improvement with Z-Pak. -Work note  Osteoporosis Joint fluid accumulation in hands due to joint support issues. No infection signs but risk if manipulated improperly. - Advised to wrap affected area with a Band-Aid snugly for pressure, alternating with periods of rest.  Insomnia Likely related to stress and current illness. - Encouraged rest and stress reduction.  Vertigo Possibly related to stress and upper respiratory symptoms. - Encouraged rest and stress reduction. -meclizine -ENT if no better  Occupational stress Significant occupational stress contributing to health decline. Decision to leave job due to stress and health concerns. - Provided a doctor's note for necessary care and absence from work until December 2nd, 2025. - Encouraged stress reduction and rest.     Review of Systems-see HPI     + = positive Constitutional:   No-   weight loss, night sweats, fevers, chills, fatigue, lassitude. HEENT:    +headaches, difficulty swallowing, tooth/dental problems, sore throat,       No-  sneezing, itching, ear ache, +nasal congestion, +post nasal drip,  CV:  L flank chest pain, orthopnea, PND, swelling in lower extremities, anasarca, dizziness, palpitations Resp: shortness of breath with exertion or at rest.                +non-productive cough,  No- coughing up of blood.                 change in color of mucus.  No recent  wheezing.   Skin: No-   rash or  lesions. GI:  No-   heartburn, indigestion, abdominal pain, nausea, vomiting, GU:  MS: + joint pain or swelling.   Neuro-     nothing unusual Psych:  No- change in mood or affect. No depression + anxiety.  No memory loss.  Objective:   Physical Exam General- Alert, Oriented, Affect-anxious/ talkative, Distress- none acute, trim, petite woman    Skin- rash-none, lesions- none, excoriation- none Lymphadenopathy- none Head- atraumatic            Eyes- Gross vision intact, PERRLA, conjunctivae clear secretions            Ears- Hearing, canals normal. +Scarring or sclerosis TMs            Nose- +Narrow with turbinate edema, clear mucus bridging, No- Septal dev, polyps, erosion, perforation             Throat- Mallampati III , mucosa clear , drainage- none, tonsils- atrophic Neck- flexible , trachea midline, no stridor , thyroid  nl, carotid no bruit Chest - symmetrical excursion , unlabored           Heart/CV- RRR , no murmur , no gallop  , no rub, nl s1 s2                           - JVD- none , edema- none, stasis changes- none, varices- none  Lung- clear to P&A, wheeze- none, cough-none, dullness-none, rub- none           Chest wall-  Abd-  Br/ Gen/ Rectal- Not done, not indicated Extrem- cyanosis- none, clubbing, none, atrophy- none, strength- nl Neuro- grossly intact to observation

## 2024-03-16 ENCOUNTER — Ambulatory Visit

## 2024-03-16 ENCOUNTER — Ambulatory Visit: Admitting: Internal Medicine

## 2024-03-16 ENCOUNTER — Encounter: Payer: Self-pay | Admitting: Internal Medicine

## 2024-03-16 VITALS — BP 116/74 | HR 88 | Temp 98.1°F | Ht 59.0 in | Wt 103.4 lb

## 2024-03-16 DIAGNOSIS — J40 Bronchitis, not specified as acute or chronic: Secondary | ICD-10-CM | POA: Diagnosis not present

## 2024-03-16 MED ORDER — AZITHROMYCIN 250 MG PO TABS: ORAL_TABLET | ORAL | 0 refills | Status: AC

## 2024-03-16 NOTE — Patient Instructions (Signed)
 Order- CXR  dx acute bronchitis  Script sent for Zpak

## 2024-03-19 NOTE — Assessment & Plan Note (Signed)
 She has been under a great deal of stress at work but she is managing without medications at this time.

## 2024-03-20 ENCOUNTER — Telehealth: Payer: Self-pay | Admitting: Family Medicine

## 2024-03-20 ENCOUNTER — Ambulatory Visit (INDEPENDENT_AMBULATORY_CARE_PROVIDER_SITE_OTHER): Admitting: Family Medicine

## 2024-03-20 ENCOUNTER — Ambulatory Visit: Admitting: Family Medicine

## 2024-03-20 ENCOUNTER — Encounter: Payer: Self-pay | Admitting: Family Medicine

## 2024-03-20 VITALS — BP 118/82 | HR 82 | Temp 98.2°F | Resp 16 | Ht 59.0 in | Wt 105.0 lb

## 2024-03-20 DIAGNOSIS — Z0000 Encounter for general adult medical examination without abnormal findings: Secondary | ICD-10-CM

## 2024-03-20 DIAGNOSIS — F32A Depression, unspecified: Secondary | ICD-10-CM | POA: Diagnosis not present

## 2024-03-20 DIAGNOSIS — E785 Hyperlipidemia, unspecified: Secondary | ICD-10-CM

## 2024-03-20 DIAGNOSIS — F419 Anxiety disorder, unspecified: Secondary | ICD-10-CM

## 2024-03-20 DIAGNOSIS — D649 Anemia, unspecified: Secondary | ICD-10-CM

## 2024-03-20 DIAGNOSIS — R739 Hyperglycemia, unspecified: Secondary | ICD-10-CM

## 2024-03-20 MED ORDER — BENZONATATE 100 MG PO CAPS: 100.0000 mg | ORAL_CAPSULE | Freq: Three times a day (TID) | ORAL | 1 refills | Status: AC | PRN

## 2024-03-20 NOTE — Progress Notes (Signed)
 Subjective:    Patient ID: [name], female    DOB: [birth year], 58 y.o.   MRN: [id number]  Chief Complaint  Patient presents with   Medical Management of Chronic Issues    Patient presents today for a follow-up.   Quality Metric Gaps    Hep B vaccines    HPI Discussed the use of AI scribe software for clinical note transcription with the patient, who gave verbal consent to proceed.  History of Present Illness [name] is a 58 year old female who presents for a follow-up visit and discussion of her recent respiratory illness.  She recently experienced a respiratory illness with symptoms including a dry cough, dizziness, headaches, and congestion. She was diagnosed with bronchitis and prescribed a Z-Pak. She uses an inhaler twice a day and takes plain Mucinex to help clear mucus. She also experiences a scratchy throat, attributed to postnasal drip, and finds relief with lemon drops and hot tea with lemon and honey. An x-ray was performed on November 20th, but results are pending due to a delay in radiology services.  She has been experiencing dry eyes, which she attributes to allergies and prolonged screen time. She uses hydrating eye drops like Refresh four or more times a day to alleviate symptoms.  She recently left her job due to high stress and bullying and is considering new employment opportunities, including positions at a community college or with [employer].  She has received her flu shot but has not yet received the Novavax COVID vaccine or the hepatitis B vaccine.    Past Medical History:  Diagnosis Date   Allergic rhinitis    Anemia    Anxiety    Asthma    Contact dermatitis and eczema    COVID-19 12/2022   Diverticulitis    CT Scan   Esophageal reflux    H. pylori infection    Hyperlipidemia, mixed    IBS (irritable bowel syndrome)    IC (interstitial cystitis)    Internal hemorrhoids    Lumbago    Mitral valve disorders(424.0)    Osteoarthritis  07/08/2015   Post concussive syndrome    Seasonal and perennial allergic rhinitis 01/20/2015    Past Surgical History:  Procedure Laterality Date   BARTHOLIN CYST MARSUPIALIZATION N/A 12/18/2013   Procedure: BARTHOLIN CYST MARSUPIALIZATION WITH BIOPSY;  Surgeon: Charlie JINNY Flowers, MD;  Location: [facility];  Service: Gynecology;  Laterality: N/A;   CESAREAN SECTION     DILITATION & CURRETTAGE/HYSTROSCOPY WITH NOVASURE ABLATION N/A 10/14/2012   Procedure: DILATATION & CURETTAGE/HYSTEROSCOPY WITH NOVASURE ABLATION;  Surgeon: Charlie JINNY Flowers, MD;  Location: [facility];  Service: Gynecology;  Laterality: N/A;   MOUTH SURGERY     SINUS SURGERY WITH INSTATRAK      Family History  Problem Relation Age of Onset   Diabetes Mother    Hypertension Mother    Heart attack Mother        MI at age 100   Alcohol abuse Mother        smoker   Heart disease Mother    Dementia Mother    Hypertension Father    Hyperlipidemia Father    Heart attack Father    Lung cancer Father        smoking hx; dx > 50   Colon polyps Sister    Mental illness Sister        anxiety from 9/11 survivors   Varicose Veins Sister    Osteoporosis Sister  Other Sister        hypoglycemia   Varicose Veins Sister    Rheum arthritis Sister        lupus   Pleurisy Sister    Fibromyalgia Sister    Mental retardation Sister        depression   Leukemia Brother        acute; dx 26s   GER disease Son    Colon cancer Paternal Uncle        dx unknown age   Dementia Maternal Grandmother    Heart disease Maternal Grandfather        mi   Cancer Paternal Grandmother        breast/ovairan before age 77   Alcohol abuse Other        Family history   Breast cancer Neg Hx     Social History   Socioeconomic History   Marital status: Married    Spouse name: Not on file   Number of children: 2   Years of education: Masters   Highest education level: Master's degree (e.g., MA, MS, MEng, MEd, MSW, MBA)  Occupational History    Occupation: Clinical Cytogeneticist  Tobacco Use   Smoking status: Never    Passive exposure: Past   Smokeless tobacco: Never  Vaping Use   Vaping status: Never Used  Substance and Sexual Activity   Alcohol use: Not Currently   Drug use: No   Sexual activity: Yes    Comment: lives with husband and son, work with [employer] as a psychologist, educational, aoivd gluten, dairy  Other Topics Concern   Not on file  Social History Narrative   Lives at home home with husband and son.   Right-handed.   1 cup caffeine daily.   Social Drivers of Corporate Investment Banker Strain: Low Risk  (03/19/2024)   Overall Financial Resource Strain (CARDIA)    Difficulty of Paying Living Expenses: Not very hard  Food Insecurity: Food Insecurity Present (03/19/2024)   Hunger Vital Sign    Worried About Running Out of Food in the Last Year: Sometimes true    Ran Out of Food in the Last Year: Never true  Transportation Needs: No Transportation Needs (03/19/2024)   PRAPARE - Administrator, Civil Service (Medical): No    Lack of Transportation (Non-Medical): No  Physical Activity: Sufficiently Active (03/19/2024)   Exercise Vital Sign    Days of Exercise per Week: 3 days    Minutes of Exercise per Session: 60 min  Stress: No Stress Concern Present (03/19/2024)   [employer] of Occupational Health - Occupational Stress Questionnaire    Feeling of Stress: Only a little  Social Connections: Socially Isolated (03/19/2024)   Social Connection and Isolation Panel    Frequency of Communication with Friends and Family: Once a week    Frequency of Social Gatherings with Friends and Family: Once a week    Attends Religious Services: Never    Database Administrator or Organizations: No    Attends Engineer, Structural: Not on file    Marital Status: Married  Catering Manager Violence: Not on file    Outpatient Medications Prior to Visit  Medication Sig Dispense Refill   albuterol  (VENTOLIN   HFA) 108 (90 Base) MCG/ACT inhaler Inhale 2 puffs into the lungs every 6 (six) hours as needed for wheezing or shortness of breath. 18 each 3   azelastine  (ASTELIN ) 0.1 % nasal spray Place 2 sprays into  both nostrils 2 (two) times daily. Use in each nostril as directed 30 mL 12   azithromycin  (ZITHROMAX ) 250 MG tablet 2 today then one daily 6 tablet 0   fluticasone  (FLONASE ) 50 MCG/ACT nasal spray Place 1-2 sprays into both nostrils daily. 16 g 3   montelukast  (SINGULAIR ) 5 MG chewable tablet Chew 1 tablet (5 mg total) by mouth at bedtime. 30 tablet 3   Multiple Vitamins-Minerals (MULTIVITAMIN ADULT PO) Take 1 tablet by mouth daily.     OVER THE COUNTER MEDICATION Calcium     OVER THE COUNTER MEDICATION Omega 3 2100     Probiotic Product (PROBIOTIC DAILY PO) Take 1 tablet by mouth daily.     sodium chloride  (OCEAN) 0.65 % SOLN nasal spray Place 1 spray into both nostrils as needed for congestion.     VITAMIN D PO Take 1 tablet by mouth daily.     Facility-Administered Medications Prior to Visit  Medication Dose Route Frequency Provider Last Rate Last Admin   methylPREDNISolone  acetate (DEPO-MEDROL ) injection 80 mg  80 mg Intramuscular Once Neysa Rama D, MD        Allergies  Allergen Reactions   Aspirin Shortness Of Breath    irritated stomach Ibuprofen and Naproxen do not agree with her either   Codeine Hives, Shortness Of Breath and Other (See Comments)    Does not take percocet or hydrocodone ; tylenol  only   Doxycycline  Other (See Comments)    Headache after one dose   Latex Shortness Of Breath, Itching and Other (See Comments)   Other Shortness Of Breath and Other (See Comments)    peanuts   Sulfa Antibiotics Hives and Shortness Of Breath   Sulfamethoxazole-Trimethoprim Shortness Of Breath   Sulfonamide Derivatives Shortness Of Breath and Itching   Sulphadimidine Sodium [Sulfamethazine Sodium] Shortness Of Breath    Added to food for preservative   Ciprofloxacin  Other (See  Comments)    REACTION: SOB, Tightness in head REACTION: SOB, Tightness in head Syncope    Carisoprodol    Clarithromycin     Doesn't agree with me   Epinephrine     Heart racing   Fenofibrate  Other (See Comments)    Leg cramps, body aches    Metronidazole  Other (See Comments)    cannot tolerate   Nsaids    Peanut (Diagnostic) Other (See Comments)    other   Penicillins     Doesn't remember reaction   Tolmetin    Omeprazole  Palpitations    Review of Systems  Constitutional:  Positive for malaise/fatigue. Negative for fever.  HENT:  Positive for congestion.   Eyes:  Negative for blurred vision.  Respiratory:  Positive for cough. Negative for shortness of breath.   Cardiovascular:  Negative for chest pain, palpitations and leg swelling.  Gastrointestinal:  Negative for abdominal pain, blood in stool and nausea.  Genitourinary:  Negative for dysuria and frequency.  Musculoskeletal:  Negative for falls.  Skin:  Negative for rash.  Neurological:  Positive for dizziness and headaches. Negative for loss of consciousness.  Endo/Heme/Allergies:  Negative for environmental allergies.  Psychiatric/Behavioral:  Negative for depression. The patient is nervous/anxious.        Objective:    Physical Exam Constitutional:      General: She is not in acute distress.    Appearance: Normal appearance. She is not diaphoretic.  HENT:     Head: Normocephalic and atraumatic.     Right Ear: Tympanic membrane, ear canal and external ear normal.  Left Ear: Tympanic membrane, ear canal and external ear normal.     Nose: Nose normal.     Mouth/Throat:     Mouth: Mucous membranes are moist.     Pharynx: Oropharynx is clear. No oropharyngeal exudate.  Eyes:     General: No scleral icterus.       Right eye: No discharge.        Left eye: No discharge.     Conjunctiva/sclera: Conjunctivae normal.     Pupils: Pupils are equal, round, and reactive to light.  Neck:     Thyroid : No  thyromegaly.  Cardiovascular:     Rate and Rhythm: Normal rate and regular rhythm.     Heart sounds: Normal heart sounds. No murmur heard. Pulmonary:     Effort: Pulmonary effort is normal. No respiratory distress.     Breath sounds: Normal breath sounds. No wheezing or rales.  Abdominal:     General: Bowel sounds are normal. There is no distension.     Palpations: Abdomen is soft. There is no mass.     Tenderness: There is no abdominal tenderness.  Musculoskeletal:        General: No tenderness. Normal range of motion.     Cervical back: Normal range of motion and neck supple.  Lymphadenopathy:     Cervical: No cervical adenopathy.  Skin:    General: Skin is warm and dry.     Findings: No rash.  Neurological:     General: No focal deficit present.     Mental Status: She is alert and oriented to person, place, and time.     Cranial Nerves: No cranial nerve deficit.     Coordination: Coordination normal.     Deep Tendon Reflexes: Reflexes are normal and symmetric. Reflexes normal.  Psychiatric:        Mood and Affect: Mood normal.        Behavior: Behavior normal.        Thought Content: Thought content normal.        Judgment: Judgment normal.     BP 118/82   Pulse 82   Temp 98.2 F (36.8 C)   Resp 16   Ht 4' 11 (1.499 m)   Wt 105 lb (47.6 kg)   LMP 06/30/2016   SpO2 96%   BMI 21.21 kg/m  Wt Readings from Last 3 Encounters:  03/20/24 105 lb (47.6 kg)  03/16/24 103 lb 6.4 oz (46.9 kg)  01/24/24 107 lb 3.2 oz (48.6 kg)    Diabetic Foot Exam - Simple   No data filed    Lab Results  Component Value Date   WBC 2.7 (L) 12/24/2023   HGB 13.7 12/24/2023   HCT 40.1 12/24/2023   PLT 262.0 12/24/2023   GLUCOSE 123 (H) 12/24/2023   CHOL 200 12/24/2023   TRIG 107.0 12/24/2023   HDL 47.50 12/24/2023   LDLDIRECT 130.0 01/10/2021   LDLCALC 131 (H) 12/24/2023   ALT 27 12/24/2023   AST 29 12/24/2023   NA 139 12/24/2023   K 4.0 12/24/2023   CL 101 12/24/2023    CREATININE 0.61 12/24/2023   BUN 11 12/24/2023   CO2 29 12/24/2023   TSH 1.60 12/24/2023   HGBA1C 5.5 12/24/2023    Lab Results  Component Value Date   TSH 1.60 12/24/2023   Lab Results  Component Value Date   WBC 2.7 (L) 12/24/2023   HGB 13.7 12/24/2023   HCT 40.1 12/24/2023   MCV 97.6 12/24/2023  PLT 262.0 12/24/2023   Lab Results  Component Value Date   NA 139 12/24/2023   K 4.0 12/24/2023   CO2 29 12/24/2023   GLUCOSE 123 (H) 12/24/2023   BUN 11 12/24/2023   CREATININE 0.61 12/24/2023   BILITOT 1.4 (H) 12/24/2023   ALKPHOS 106 12/24/2023   AST 29 12/24/2023   ALT 27 12/24/2023   PROT 6.9 12/24/2023   ALBUMIN 4.3 12/24/2023   CALCIUM 9.1 12/24/2023   ANIONGAP 10 01/09/2019   GFR 98.37 12/24/2023   Lab Results  Component Value Date   CHOL 200 12/24/2023   Lab Results  Component Value Date   HDL 47.50 12/24/2023   Lab Results  Component Value Date   LDLCALC 131 (H) 12/24/2023   Lab Results  Component Value Date   TRIG 107.0 12/24/2023   Lab Results  Component Value Date   CHOLHDL 4 12/24/2023   Lab Results  Component Value Date   HGBA1C 5.5 12/24/2023       Assessment & Plan:  Anxiety and depression Assessment & Plan: She has been under a great deal of stress at work but she is managing without medications at this time.   Other orders -     Benzonatate ; Take 1 capsule (100 mg total) by mouth 3 (three) times daily as needed for cough.  Dispense: 60 capsule; Refill: 1    Assessment and Plan Assessment & Plan Adult Wellness Visit Routine wellness visit with discussion on recent job change due to stress and bullying. Vaccination status reviewed, including flu shot received, Novavax COVID vaccine not received, and hepatitis B vaccine not deemed necessary due to low risk. Tetanus vaccine not due until 2029 unless significant injury occurs. - Will schedule follow-up appointment after the holidays for blood work. - Will administer COVID vaccine  after Thanksgiving. - Advised to avoid tetanus vaccine unless significant injury occurs.  Acute bronchitis, improving Acute bronchitis with wheezing and chest pressure, improving with Z-Pak. No wheezing on examination. Awaiting chest x-ray results. Advised on inhaler use and Mucinex for mucus clearance. Discussed potential for pertussis and the role of Z-Pak in treatment. Z-Pak remains effective for 10 days post-treatment. - Continue inhaler use twice daily. - Take plain Mucinex twice daily. - Perform deep breathing exercises. - Monitor for new fever or worsening symptoms. - Sent prescription for Tessalon  Perles to [facility].  Dry eye syndrome, likely due to screen use and allergies Dry eye syndrome likely due to screen use and allergies. Discussed epidemic of dry eyes due to screen use and dehydration. Recommended use of hydrating eye drops and proper technique to improve symptoms. - Use hydrating eye drops like Refresh four or more times a day. - Perform blinking exercises after using eye drops.  Anxiety disorder Anxiety related to recent job change and stress. Discussed importance of managing stress and finding a suitable job environment. - Supported job search efforts.  Recording duration: 17 minutes     Harlene Horton, MD

## 2024-03-20 NOTE — Patient Instructions (Signed)
 Social Anxiety Disorder, Adult Social anxiety disorder (SAD), previously called social phobia, is a mental health condition. People with SAD often feel nervous, afraid, or embarrassed when they are around other people in social situations. They worry that other people are judging or criticizing them for how they look, what they say, or how they act. SAD involves more than just feeling shy or self-conscious at times. It can cause severe emotional distress. It can interfere with activities of daily life. SAD also may lead to alcohol or drug use, and even suicide. SAD is a common mental health condition. It can develop at any time, but it usually starts in the teenage years. What are the causes? The cause of this condition is not known. It may involve genes that are passed through families. Stressful events may trigger anxiety. This disorder is also associated with an overactive amygdala. The amygdala is the part of the brain that triggers the response to strong feelings, such as fear. What increases the risk? This condition is more likely to develop in: People who have a family history of anxiety disorders. Females. People who have a physical or behavioral condition that makes them feel self-conscious or nervous, such as a stutter or a long-term (chronic) disease. What are the signs or symptoms? The main symptom of this condition is fear of embarrassment caused by being criticized or judged in social situations. You may be afraid to: Speak in public. Go shopping. Use a public bathroom. Eat at a restaurant. Go to work. Interact with people you do not know. Extreme fear and anxiety may cause physical symptoms, including: Blushing. A fast heartbeat or shortness of breath. Sweating. Shaky hands or voice. Confusion. Light-headedness. Upset stomach, diarrhea, or vomiting. How is this diagnosed? This condition is diagnosed based on your history, symptoms, and behavior in social situations. You  may be diagnosed with this type of anxiety if your symptoms have lasted for more than 6 months and have been present on more days than not. Your health care provider may ask you about your use of alcohol, drugs, and prescription medicines. He or she may also refer you to a mental health specialist for further evaluation or treatment. How is this treated? Treatment for this condition may include: Cognitive behavioral therapy (CBT). This type of talk therapy helps you learn to replace negative thoughts and behaviors with positive ones. This may include learning how to use self-calming skills and other methods of managing your anxiety. Exposure therapy. You will be exposed to social situations that cause you fear. The treatment starts with practicing self-calming in situations that cause you low levels of fear. Over time, you will progress by sustaining self-calming and managing harder situations. Prescription medicines. These medicines may be used by themselves or in addition to other therapies. Biofeedback. This process trains you to manage your body's response (physiological response) through breathing techniques and relaxation methods. You will work with a therapist while machines are used to monitor your physical symptoms. Techniques for relaxation and managing anxiety. These include deep breathing, self-talk, meditation, visual imagery, muscle relaxation, listening to music, and yoga. These techniques are often used with other therapies to keep you calm in situations that cause you anxiety. These treatments are often used in combination. Follow these instructions at home: Alcohol use If you drink alcohol: Limit how much you have to: 0-1 drink a day for women who are not pregnant. 0-2 drinks a day for men. Know how much alcohol is in a drink. In the [place]  one drink equals one 12 oz bottle of beer (355 mL), one 5 oz glass of wine (148 mL), or one 1 oz glass of hard liquor (44 mL). General  instructions Take over-the-counter and prescription medicines only as told by your health care provider. Practice techniques for relaxation and managing anxiety at times you are not challenged by social anxiety. Return to social activities using techniques you have learned, as you feel ready to do so. Avoid caffeine and certain over-the-counter cold medicines. These may make you feel worse. Ask your pharmacist which medicines to avoid. Keep all follow-up visits. This is important. Where to find more information The [employer] on Mental Illness (NAMI): nami.org Social Anxiety Association: socialphobia.org Mental Health [facility]): AlumniSpecial.hu Anxiety and Depression Association of [place] (ADAA): adaa.org Contact a health care provider if: Your symptoms do not improve or get worse. You have signs of depression, such as: Persistent sadness or moodiness. Loss of enjoyment in activities that used to bring you joy. Change in weight or eating. Changes in sleeping habits. You become more isolated than you normally are. You find it more and more difficult to speak or interact with others. You are using drugs. You are drinking more alcohol than usual. Get help right away if: You harm yourself. You have suicidal thoughts. Get help right away if you feel like you may hurt yourself or others, or have thoughts about taking your own life. Go to your nearest emergency room or: Call 911. Call the National Suicide Prevention Lifeline at 929 186 8741 or 988. This is open 24 hours a day. Text the Crisis Text Line at 830-811-3845. This information is not intended to replace advice given to you by your health care provider. Make sure you discuss any questions you have with your health care provider. Document Revised: 01/20/2022 Document Reviewed: 08/04/2020 Elsevier Patient Education  2024 [employer].

## 2024-03-20 NOTE — Telephone Encounter (Signed)
 Please advise

## 2024-03-20 NOTE — Telephone Encounter (Signed)
 Pt wants to get labs done before her cpe in march,please call pt to make lab appt once orders are in

## 2024-03-21 ENCOUNTER — Telehealth: Payer: Self-pay

## 2024-03-21 DIAGNOSIS — R0789 Other chest pain: Secondary | ICD-10-CM

## 2024-03-21 NOTE — Telephone Encounter (Signed)
 Pt went downstairs to schedule echocardiogram that was ordered by PCP on 07/15/23 however was informed she would need a new order and that she would have to pay approximately $800- Pt states she was informed by imaging schedulers that only 1 insurance was ran, she is requesting that both insurances be processed before 03/27/24 because she is losing coverage on one of those insurances on that day. I have placed a new order.

## 2024-03-21 NOTE — Telephone Encounter (Signed)
 Domenica Harlene LABOR, MD to Hubert Cherise BROCKS, CMA (Selected Message)     03/20/24 10:06 PM Order her a hgba1c, cmp, cbc, tsh and lipid prior to cpe in April and set up lab appt

## 2024-03-21 NOTE — Addendum Note (Signed)
 Addended by: Altheria Shadoan D on: 03/21/2024 10:02 AM   Modules accepted: Orders

## 2024-03-21 NOTE — Telephone Encounter (Signed)
 Spoke w/ Pt- lab appt scheduled.   She forgot to mention R hand arthritis issues at her visit yesterday w/ Dr. Domenica- she is asking for recommendations.

## 2024-03-22 NOTE — Telephone Encounter (Signed)
 Noted! Thank you

## 2024-03-22 NOTE — Telephone Encounter (Signed)
 Zegarra, Laila M, NT to Me  (Selected Message)     03/22/24  2:28 PM [facility] does not required a prior auth for the ECHO and I spoke to [facility] and they also don't required an auth for an ECHO. Patient is good to go.

## 2024-03-23 ENCOUNTER — Ambulatory Visit: Payer: Self-pay | Admitting: Internal Medicine

## 2024-03-28 NOTE — Progress Notes (Signed)
 Atcp x1, left vm to call back

## 2024-03-30 NOTE — Telephone Encounter (Signed)
 Copied from CRM #[id number]. Topic: General - Other >> Mar 28, 2024 11:08 AM Benton O wrote: Reason for CRM: patient returning call from cma ashanti concerning patient xray results please give patient a call back concerning results . Did relay message about results and patient said ok . If anything else is need please give patient a call back   Per E2C2 Notes:  Did relay message about results and patient said ok . If anything else is need please give patient a call back     NFN.

## 2024-05-15 ENCOUNTER — Ambulatory Visit: Payer: Self-pay | Admitting: Family Medicine

## 2024-05-15 ENCOUNTER — Ambulatory Visit (HOSPITAL_BASED_OUTPATIENT_CLINIC_OR_DEPARTMENT_OTHER)
Admission: RE | Admit: 2024-05-15 | Discharge: 2024-05-15 | Disposition: A | Discharge status: Home / Self Care | Source: Ambulatory Visit | Attending: Family Medicine | Admitting: Family Medicine

## 2024-05-15 DIAGNOSIS — R0789 Other chest pain: Secondary | ICD-10-CM | POA: Insufficient documentation

## 2024-05-15 DIAGNOSIS — I088 Other rheumatic multiple valve diseases: Secondary | ICD-10-CM | POA: Diagnosis not present

## 2024-05-15 DIAGNOSIS — R079 Chest pain, unspecified: Secondary | ICD-10-CM | POA: Diagnosis not present

## 2024-05-15 LAB — ECHOCARDIOGRAM COMPLETE
AR max vel: 2.19 cm2
AV Area VTI: 2.23 cm2
AV Area mean vel: 2.3 cm2
AV Mean grad: 3 mmHg
AV Peak grad: 6.7 mmHg
Ao pk vel: 1.29 m/s
Area-P 1/2: 3.42 cm2
Calc EF: 65.9 %
MV M vel: 3.12 m/s
MV Peak grad: 38.9 mmHg
S' Lateral: 2.4 cm
Single Plane A2C EF: 70.5 %
Single Plane A4C EF: 61.9 %

## 2024-07-14 ENCOUNTER — Other Ambulatory Visit

## 2024-07-19 ENCOUNTER — Encounter: Admitting: Medical

## 2024-07-27 ENCOUNTER — Other Ambulatory Visit

## 2024-07-28 ENCOUNTER — Other Ambulatory Visit

## 2024-07-31 ENCOUNTER — Encounter: Admitting: Medical

## 2024-07-31 ENCOUNTER — Encounter: Admitting: Family Medicine

## 2024-08-08 ENCOUNTER — Ambulatory Visit: Admitting: Internal Medicine

## 2024-10-02 ENCOUNTER — Ambulatory Visit: Admitting: Cardiology

## 2024-12-08 ENCOUNTER — Ambulatory Visit: Admitting: Internal Medicine
